# Patient Record
Sex: Male | Born: 1945
Health system: Southern US, Community
[De-identification: ages and names within clinical notes are randomized; demographics above are authoritative.]

## PROBLEM LIST (undated history)

## (undated) DIAGNOSIS — J449 Chronic obstructive pulmonary disease, unspecified: Secondary | ICD-10-CM

## (undated) DIAGNOSIS — Z9289 Personal history of other medical treatment: Secondary | ICD-10-CM

## (undated) DIAGNOSIS — D126 Benign neoplasm of colon, unspecified: Secondary | ICD-10-CM

## (undated) DIAGNOSIS — E785 Hyperlipidemia, unspecified: Secondary | ICD-10-CM

## (undated) DIAGNOSIS — H269 Unspecified cataract: Secondary | ICD-10-CM

## (undated) DIAGNOSIS — Z87442 Personal history of urinary calculi: Secondary | ICD-10-CM

## (undated) DIAGNOSIS — H409 Unspecified glaucoma: Secondary | ICD-10-CM

## (undated) DIAGNOSIS — C159 Malignant neoplasm of esophagus, unspecified: Secondary | ICD-10-CM

## (undated) DIAGNOSIS — I2510 Atherosclerotic heart disease of native coronary artery without angina pectoris: Secondary | ICD-10-CM

## (undated) DIAGNOSIS — D369 Benign neoplasm, unspecified site: Secondary | ICD-10-CM

## (undated) DIAGNOSIS — I6522 Occlusion and stenosis of left carotid artery: Secondary | ICD-10-CM

## (undated) DIAGNOSIS — R7303 Prediabetes: Secondary | ICD-10-CM

## (undated) DIAGNOSIS — J439 Emphysema, unspecified: Secondary | ICD-10-CM

## (undated) DIAGNOSIS — K5790 Diverticulosis of intestine, part unspecified, without perforation or abscess without bleeding: Secondary | ICD-10-CM

## (undated) DIAGNOSIS — Z8719 Personal history of other diseases of the digestive system: Secondary | ICD-10-CM

## (undated) DIAGNOSIS — J189 Pneumonia, unspecified organism: Secondary | ICD-10-CM

## (undated) DIAGNOSIS — I10 Essential (primary) hypertension: Secondary | ICD-10-CM

## (undated) DIAGNOSIS — T8859XA Other complications of anesthesia, initial encounter: Secondary | ICD-10-CM

## (undated) DIAGNOSIS — Z720 Tobacco use: Secondary | ICD-10-CM

## (undated) DIAGNOSIS — I739 Peripheral vascular disease, unspecified: Secondary | ICD-10-CM

## (undated) DIAGNOSIS — K2270 Barrett's esophagus without dysplasia: Secondary | ICD-10-CM

## (undated) DIAGNOSIS — R011 Cardiac murmur, unspecified: Secondary | ICD-10-CM

## (undated) DIAGNOSIS — R000 Tachycardia, unspecified: Secondary | ICD-10-CM

## (undated) DIAGNOSIS — K219 Gastro-esophageal reflux disease without esophagitis: Secondary | ICD-10-CM

## (undated) DIAGNOSIS — T4145XA Adverse effect of unspecified anesthetic, initial encounter: Secondary | ICD-10-CM

## (undated) DIAGNOSIS — R911 Solitary pulmonary nodule: Secondary | ICD-10-CM

## (undated) DIAGNOSIS — E119 Type 2 diabetes mellitus without complications: Secondary | ICD-10-CM

## (undated) DIAGNOSIS — M1990 Unspecified osteoarthritis, unspecified site: Secondary | ICD-10-CM

## (undated) HISTORY — DX: Personal history of other medical treatment: Z92.89

## (undated) HISTORY — DX: Cardiac murmur, unspecified: R01.1

## (undated) HISTORY — DX: Barrett's esophagus without dysplasia: K22.70

## (undated) HISTORY — DX: Peripheral vascular disease, unspecified: I73.9

## (undated) HISTORY — DX: Atherosclerotic heart disease of native coronary artery without angina pectoris: I25.10

## (undated) HISTORY — DX: Hyperlipidemia, unspecified: E78.5

## (undated) HISTORY — PX: CATARACT EXTRACTION: SUR2

## (undated) HISTORY — DX: Unspecified cataract: H26.9

## (undated) HISTORY — DX: Benign neoplasm of colon, unspecified: D12.6

## (undated) HISTORY — DX: Gastro-esophageal reflux disease without esophagitis: K21.9

## (undated) HISTORY — DX: Diverticulosis of intestine, part unspecified, without perforation or abscess without bleeding: K57.90

## (undated) HISTORY — DX: Chronic obstructive pulmonary disease, unspecified: J44.9

## (undated) HISTORY — DX: Tachycardia, unspecified: R00.0

## (undated) HISTORY — PX: JOINT REPLACEMENT: SHX530

## (undated) HISTORY — PX: EYE SURGERY: SHX253

## (undated) HISTORY — DX: Tobacco use: Z72.0

## (undated) HISTORY — DX: Malignant neoplasm of esophagus, unspecified: C15.9

## (undated) HISTORY — DX: Benign neoplasm, unspecified site: D36.9

---
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
## 1991-07-20 HISTORY — PX: FEMORAL-POPLITEAL BYPASS GRAFT: SHX937

## 1996-07-19 HISTORY — PX: CORONARY ARTERY BYPASS GRAFT: SHX141

## 1997-07-19 DIAGNOSIS — R000 Tachycardia, unspecified: Secondary | ICD-10-CM

## 1997-07-19 HISTORY — DX: Tachycardia, unspecified: R00.0

## 1997-11-12 ENCOUNTER — Inpatient Hospital Stay (HOSPITAL_COMMUNITY): Admission: AD | Admit: 1997-11-12 | Discharge: 1997-11-14 | Payer: Self-pay | Admitting: Cardiology

## 1999-02-13 ENCOUNTER — Encounter: Payer: Self-pay | Admitting: Vascular Surgery

## 1999-02-16 ENCOUNTER — Ambulatory Visit: Admission: RE | Admit: 1999-02-16 | Discharge: 1999-02-16 | Payer: Self-pay | Admitting: Vascular Surgery

## 2000-06-21 ENCOUNTER — Encounter: Admission: RE | Admit: 2000-06-21 | Discharge: 2000-06-21 | Payer: Self-pay | Admitting: Urology

## 2000-10-31 ENCOUNTER — Ambulatory Visit (HOSPITAL_COMMUNITY): Admission: RE | Admit: 2000-10-31 | Discharge: 2000-10-31 | Payer: Self-pay | Admitting: Cardiology

## 2002-08-16 ENCOUNTER — Ambulatory Visit (HOSPITAL_COMMUNITY): Admission: RE | Admit: 2002-08-16 | Discharge: 2002-08-16 | Payer: Self-pay | Admitting: Internal Medicine

## 2002-08-16 ENCOUNTER — Encounter: Payer: Self-pay | Admitting: Internal Medicine

## 2003-04-19 ENCOUNTER — Ambulatory Visit (HOSPITAL_COMMUNITY): Admission: RE | Admit: 2003-04-19 | Discharge: 2003-04-19 | Payer: Self-pay | Admitting: Internal Medicine

## 2003-04-19 HISTORY — PX: COLONOSCOPY: SHX174

## 2004-01-25 ENCOUNTER — Ambulatory Visit (HOSPITAL_COMMUNITY): Admission: RE | Admit: 2004-01-25 | Discharge: 2004-01-25 | Payer: Self-pay | Admitting: Internal Medicine

## 2004-02-04 ENCOUNTER — Ambulatory Visit (HOSPITAL_COMMUNITY): Admission: RE | Admit: 2004-02-04 | Discharge: 2004-02-04 | Payer: Self-pay | Admitting: Internal Medicine

## 2004-03-10 ENCOUNTER — Encounter (HOSPITAL_COMMUNITY): Admission: RE | Admit: 2004-03-10 | Discharge: 2004-04-09 | Payer: Self-pay | Admitting: Neurology

## 2004-10-21 ENCOUNTER — Ambulatory Visit: Payer: Self-pay | Admitting: Cardiology

## 2004-10-28 ENCOUNTER — Ambulatory Visit (HOSPITAL_COMMUNITY): Admission: RE | Admit: 2004-10-28 | Discharge: 2004-10-28 | Payer: Self-pay | Admitting: Cardiology

## 2004-10-28 ENCOUNTER — Ambulatory Visit: Payer: Self-pay | Admitting: *Deleted

## 2004-11-20 ENCOUNTER — Ambulatory Visit: Payer: Self-pay | Admitting: Cardiology

## 2004-11-20 ENCOUNTER — Ambulatory Visit (HOSPITAL_COMMUNITY): Admission: RE | Admit: 2004-11-20 | Discharge: 2004-11-20 | Payer: Self-pay | Admitting: Family Medicine

## 2004-12-24 ENCOUNTER — Encounter: Admission: RE | Admit: 2004-12-24 | Discharge: 2004-12-24 | Payer: Self-pay | Admitting: Neurology

## 2005-03-24 ENCOUNTER — Ambulatory Visit (HOSPITAL_COMMUNITY): Admission: RE | Admit: 2005-03-24 | Discharge: 2005-03-24 | Payer: Self-pay | Admitting: Internal Medicine

## 2005-06-06 ENCOUNTER — Emergency Department (HOSPITAL_COMMUNITY): Admission: EM | Admit: 2005-06-06 | Discharge: 2005-06-06 | Payer: Self-pay | Admitting: Emergency Medicine

## 2005-07-06 ENCOUNTER — Ambulatory Visit (HOSPITAL_COMMUNITY): Admission: RE | Admit: 2005-07-06 | Discharge: 2005-07-06 | Payer: Self-pay | Admitting: Neurology

## 2005-07-08 ENCOUNTER — Ambulatory Visit (HOSPITAL_COMMUNITY): Admission: RE | Admit: 2005-07-08 | Discharge: 2005-07-08 | Payer: Self-pay | Admitting: Family Medicine

## 2005-07-15 ENCOUNTER — Ambulatory Visit: Payer: Self-pay | Admitting: Cardiology

## 2005-08-27 ENCOUNTER — Ambulatory Visit: Payer: Self-pay | Admitting: Cardiology

## 2006-08-24 ENCOUNTER — Emergency Department (HOSPITAL_COMMUNITY): Admission: EM | Admit: 2006-08-24 | Discharge: 2006-08-24 | Payer: Self-pay | Admitting: Emergency Medicine

## 2006-08-30 ENCOUNTER — Ambulatory Visit: Payer: Self-pay | Admitting: Cardiology

## 2006-12-06 ENCOUNTER — Ambulatory Visit: Payer: Self-pay | Admitting: Cardiology

## 2006-12-16 ENCOUNTER — Ambulatory Visit: Payer: Self-pay | Admitting: Internal Medicine

## 2006-12-30 ENCOUNTER — Ambulatory Visit (HOSPITAL_COMMUNITY): Admission: RE | Admit: 2006-12-30 | Discharge: 2006-12-30 | Payer: Self-pay | Admitting: Internal Medicine

## 2006-12-30 ENCOUNTER — Ambulatory Visit: Payer: Self-pay | Admitting: Internal Medicine

## 2006-12-30 ENCOUNTER — Encounter: Payer: Self-pay | Admitting: Internal Medicine

## 2007-01-06 ENCOUNTER — Ambulatory Visit: Payer: Self-pay | Admitting: Vascular Surgery

## 2007-01-25 ENCOUNTER — Ambulatory Visit: Payer: Self-pay | Admitting: Cardiology

## 2007-02-09 ENCOUNTER — Ambulatory Visit: Payer: Self-pay | Admitting: Cardiology

## 2007-02-21 ENCOUNTER — Encounter: Payer: Self-pay | Admitting: Internal Medicine

## 2007-02-21 ENCOUNTER — Ambulatory Visit (HOSPITAL_COMMUNITY): Admission: RE | Admit: 2007-02-21 | Discharge: 2007-02-21 | Payer: Self-pay | Admitting: Internal Medicine

## 2007-02-21 ENCOUNTER — Ambulatory Visit: Payer: Self-pay | Admitting: Internal Medicine

## 2007-04-04 ENCOUNTER — Ambulatory Visit: Payer: Self-pay | Admitting: Cardiology

## 2007-06-22 ENCOUNTER — Ambulatory Visit: Payer: Self-pay | Admitting: Cardiology

## 2007-07-06 ENCOUNTER — Ambulatory Visit: Payer: Self-pay | Admitting: Internal Medicine

## 2007-07-26 ENCOUNTER — Ambulatory Visit (HOSPITAL_COMMUNITY): Admission: RE | Admit: 2007-07-26 | Discharge: 2007-07-26 | Payer: Self-pay | Admitting: Family Medicine

## 2007-07-28 ENCOUNTER — Ambulatory Visit: Payer: Self-pay | Admitting: Vascular Surgery

## 2007-10-24 ENCOUNTER — Ambulatory Visit: Payer: Self-pay | Admitting: Gastroenterology

## 2007-10-25 ENCOUNTER — Ambulatory Visit (HOSPITAL_COMMUNITY): Admission: RE | Admit: 2007-10-25 | Discharge: 2007-10-25 | Payer: Self-pay | Admitting: Internal Medicine

## 2007-12-29 ENCOUNTER — Ambulatory Visit: Payer: Self-pay | Admitting: Vascular Surgery

## 2008-07-10 ENCOUNTER — Ambulatory Visit: Payer: Self-pay | Admitting: Vascular Surgery

## 2008-07-19 DIAGNOSIS — D369 Benign neoplasm, unspecified site: Secondary | ICD-10-CM

## 2008-07-19 HISTORY — PX: LIPOMA EXCISION: SHX5283

## 2008-07-19 HISTORY — DX: Benign neoplasm, unspecified site: D36.9

## 2008-07-19 HISTORY — PX: ESOPHAGECTOMY: SUR457

## 2008-07-19 HISTORY — PX: OTHER SURGICAL HISTORY: SHX169

## 2008-07-22 ENCOUNTER — Encounter (INDEPENDENT_AMBULATORY_CARE_PROVIDER_SITE_OTHER): Payer: Self-pay | Admitting: General Surgery

## 2008-07-22 ENCOUNTER — Ambulatory Visit (HOSPITAL_COMMUNITY): Admission: RE | Admit: 2008-07-22 | Discharge: 2008-07-22 | Payer: Self-pay | Admitting: General Surgery

## 2008-12-12 ENCOUNTER — Encounter: Payer: Self-pay | Admitting: Gastroenterology

## 2008-12-19 ENCOUNTER — Encounter: Payer: Self-pay | Admitting: Gastroenterology

## 2008-12-20 ENCOUNTER — Ambulatory Visit: Payer: Self-pay | Admitting: Vascular Surgery

## 2009-01-24 ENCOUNTER — Ambulatory Visit (HOSPITAL_COMMUNITY): Admission: RE | Admit: 2009-01-24 | Discharge: 2009-01-24 | Payer: Self-pay | Admitting: Internal Medicine

## 2009-02-03 DIAGNOSIS — R0989 Other specified symptoms and signs involving the circulatory and respiratory systems: Secondary | ICD-10-CM | POA: Insufficient documentation

## 2009-02-03 DIAGNOSIS — K219 Gastro-esophageal reflux disease without esophagitis: Secondary | ICD-10-CM | POA: Insufficient documentation

## 2009-02-03 DIAGNOSIS — I739 Peripheral vascular disease, unspecified: Secondary | ICD-10-CM | POA: Insufficient documentation

## 2009-02-03 DIAGNOSIS — I2510 Atherosclerotic heart disease of native coronary artery without angina pectoris: Secondary | ICD-10-CM | POA: Insufficient documentation

## 2009-02-03 DIAGNOSIS — K921 Melena: Secondary | ICD-10-CM | POA: Insufficient documentation

## 2009-02-03 DIAGNOSIS — E785 Hyperlipidemia, unspecified: Secondary | ICD-10-CM | POA: Insufficient documentation

## 2009-02-03 DIAGNOSIS — D179 Benign lipomatous neoplasm, unspecified: Secondary | ICD-10-CM | POA: Insufficient documentation

## 2009-03-10 DIAGNOSIS — K625 Hemorrhage of anus and rectum: Secondary | ICD-10-CM | POA: Insufficient documentation

## 2009-03-10 DIAGNOSIS — I10 Essential (primary) hypertension: Secondary | ICD-10-CM | POA: Insufficient documentation

## 2009-03-11 ENCOUNTER — Ambulatory Visit: Payer: Self-pay | Admitting: Internal Medicine

## 2009-03-11 DIAGNOSIS — K5900 Constipation, unspecified: Secondary | ICD-10-CM | POA: Insufficient documentation

## 2009-03-11 DIAGNOSIS — K2270 Barrett's esophagus without dysplasia: Secondary | ICD-10-CM | POA: Insufficient documentation

## 2009-03-12 ENCOUNTER — Encounter: Payer: Self-pay | Admitting: Internal Medicine

## 2009-03-17 ENCOUNTER — Ambulatory Visit: Payer: Self-pay | Admitting: Internal Medicine

## 2009-03-17 ENCOUNTER — Ambulatory Visit (HOSPITAL_COMMUNITY): Admission: RE | Admit: 2009-03-17 | Discharge: 2009-03-17 | Payer: Self-pay | Admitting: Internal Medicine

## 2009-03-17 ENCOUNTER — Encounter: Payer: Self-pay | Admitting: Internal Medicine

## 2009-03-17 HISTORY — PX: COLONOSCOPY: SHX174

## 2009-03-17 HISTORY — PX: ESOPHAGOGASTRODUODENOSCOPY: SHX1529

## 2009-03-19 DIAGNOSIS — C159 Malignant neoplasm of esophagus, unspecified: Secondary | ICD-10-CM

## 2009-03-19 DIAGNOSIS — D126 Benign neoplasm of colon, unspecified: Secondary | ICD-10-CM

## 2009-03-19 HISTORY — DX: Benign neoplasm of colon, unspecified: D12.6

## 2009-03-19 HISTORY — DX: Malignant neoplasm of esophagus, unspecified: C15.9

## 2009-03-20 ENCOUNTER — Encounter: Payer: Self-pay | Admitting: Internal Medicine

## 2009-03-26 ENCOUNTER — Encounter: Payer: Self-pay | Admitting: Internal Medicine

## 2009-04-01 ENCOUNTER — Encounter: Payer: Self-pay | Admitting: Internal Medicine

## 2009-04-03 ENCOUNTER — Encounter: Payer: Self-pay | Admitting: Internal Medicine

## 2009-04-09 ENCOUNTER — Encounter: Payer: Self-pay | Admitting: Internal Medicine

## 2009-04-18 ENCOUNTER — Encounter: Payer: Self-pay | Admitting: Internal Medicine

## 2009-05-28 ENCOUNTER — Encounter: Payer: Self-pay | Admitting: Gastroenterology

## 2009-06-25 ENCOUNTER — Encounter: Payer: Self-pay | Admitting: Internal Medicine

## 2009-07-31 ENCOUNTER — Encounter: Payer: Self-pay | Admitting: Internal Medicine

## 2009-08-27 DIAGNOSIS — Z9289 Personal history of other medical treatment: Secondary | ICD-10-CM

## 2009-08-27 HISTORY — DX: Personal history of other medical treatment: Z92.89

## 2010-08-08 ENCOUNTER — Encounter: Payer: Self-pay | Admitting: Internal Medicine

## 2010-08-20 NOTE — Letter (Signed)
Summary: OFFICE NOTE/[facility]  OFFICE NOTE/[facility]   Imported By: Diana Eves 07/31/2009 10:40:03  _____________________________________________________________________  External Attachment:    Type:   Image     Comment:   External Document

## 2010-10-21 ENCOUNTER — Other Ambulatory Visit (HOSPITAL_COMMUNITY): Payer: Self-pay | Admitting: Family Medicine

## 2010-10-27 ENCOUNTER — Other Ambulatory Visit (HOSPITAL_COMMUNITY): Payer: Self-pay

## 2010-10-28 ENCOUNTER — Encounter (HOSPITAL_COMMUNITY): Payer: Self-pay

## 2010-10-28 ENCOUNTER — Ambulatory Visit (HOSPITAL_COMMUNITY)
Admission: RE | Admit: 2010-10-28 | Discharge: 2010-10-28 | Disposition: A | Discharge status: Home / Self Care | Payer: Medicare Other | Source: Ambulatory Visit | Attending: Family Medicine | Admitting: Family Medicine

## 2010-10-28 DIAGNOSIS — Z9089 Acquired absence of other organs: Secondary | ICD-10-CM | POA: Insufficient documentation

## 2010-10-28 DIAGNOSIS — R109 Unspecified abdominal pain: Secondary | ICD-10-CM | POA: Insufficient documentation

## 2010-10-28 HISTORY — DX: Essential (primary) hypertension: I10

## 2010-10-28 MED ORDER — TECHNETIUM TC 99M MEBROFENIN IV KIT
5.0000 | PACK | Freq: Once | INTRAVENOUS | Status: AC | PRN
  Administered 2010-10-28: 5.5 via INTRAVENOUS

## 2010-11-03 ENCOUNTER — Encounter: Payer: Self-pay | Admitting: Urgent Care

## 2010-11-03 ENCOUNTER — Ambulatory Visit (INDEPENDENT_AMBULATORY_CARE_PROVIDER_SITE_OTHER): Payer: Medicare Other | Admitting: Urgent Care

## 2010-11-03 DIAGNOSIS — K219 Gastro-esophageal reflux disease without esophagitis: Secondary | ICD-10-CM

## 2010-11-03 DIAGNOSIS — D126 Benign neoplasm of colon, unspecified: Secondary | ICD-10-CM

## 2010-11-03 DIAGNOSIS — R1013 Epigastric pain: Secondary | ICD-10-CM

## 2010-11-03 HISTORY — DX: Benign neoplasm of colon, unspecified: D12.6

## 2010-11-03 NOTE — Progress Notes (Signed)
Primary Care Physician:  [name], MD Primary Gastroenterologist:  Dr. Jena Gauss  Chief Complaint  Patient presents with  . Gastrophageal Reflux    Hx of esophageal cancer    HPI:  Gregory Eaton is a 65 y.o. male here for further evaluation of refractory GERD, chest pain.  Hx esophageal ca dx 2010 s/p esophagectomy at [facility] (Dr. Lenis Noon) for T1N1M0 signet ring adenocarcinoma.  Hx chronic GERD.  C/o "churning" & pressure in epigastrum & chest pain.  Was off PPI since 2010.  Started omeprazole 20mg  daily 3 weeks ago.  Pain seems better w/ omeprazole with less episodes & less severe pain, but continues to have episodes.  Pain is worse w/ stress.  Eats 2 meals per day.  Denies dysphagia.  Was seen at [facility] Sep 23 2010 & had CT & was told it was normal.  C/o constipation, having BM daily but not a "good BM".   Denies rectal bleeding or melena.  Has seen cardiologist as well.  Wt stable.    10/21/10 HIDA->Patent biliary tree, Normal gallbladder response to CCK stimulation with normal ejection  fraction of 62%, Suspect gastric pull-up post esophagectomy.    Past Medical History  Diagnosis Date  . Esophageal carcinoma 03/2009    T1N1M0  . Hypertension   . Barrett's esophagus   . GERD (gastroesophageal reflux disease)   . CAD (coronary artery disease)   . PVD (peripheral vascular disease)   . Hyperlipidemia   . Adenomatous colon polyp 03/2009    Last colonoscopy by Dr. Jena Gauss   . Diverticulosis   . Tobacco abuse   . Tachyarrhythmia 1999    Status post ablation at [facility]  . COPD (chronic obstructive pulmonary disease)     Past Surgical History  Procedure Date  . Coronary artery bypass graft 1998  . Femoral-popliteal bypass graft 1993  . Lipoma excision 2010  . Esophagectomy 2010    [facility] Dr. [name]    Current Outpatient Prescriptions  Medication Sig Dispense Refill  . aspirin 81 MG EC tablet Take 81 mg by mouth daily.        . Coenzyme Q10 (CO Q-10) 200 MG CAPS Take 1  tablet by mouth daily.        . CRESTOR 10 MG tablet Take 1 tablet by mouth every other day.      . fish oil-omega-3 fatty acids 1000 MG capsule Take 1 g by mouth daily.        . metoprolol (LOPRESSOR) 50 MG tablet Take 1.5 tablets by mouth Twice daily.      . Multiple Vitamins-Minerals (CENTRUM SILVER PO) Take 1 tablet by mouth daily.        Marland Kitchen NITROSTAT 0.4 MG SL tablet Place 1 tablet under the tongue Every 5 minutes.      Marland Kitchen VITAMIN C, CALCIUM ASCORBATE, PO Take 1,000 mg by mouth daily.        . vitamin E 400 UNIT capsule Take 400 Units by mouth daily.          Allergies as of 11/03/2010 - Review Complete 11/03/2010  Allergen Reaction Noted  . Prednisone  10/28/2010    Family History:There is no known family history of colorectal carcinoma , liver disease, or inflammatory bowel disease.   Problem Relation Age of Onset  . GER disease Mother   . Coronary artery disease Brother   . Congenital heart disease Sister     History   Social History  . Marital Status: Widowed  Spouse Name: N/A    Number of Children: 2  . Years of Education: N/A   Occupational History  . retired     [employer]al architect   Social History Main Topics  . Smoking status: Former Smoker -- 2.0 packs/day for 40 years    Types: Cigarettes    Quit date: 07/20/1991  . Smokeless tobacco: Never Used  . Alcohol Use: 0.0 oz/week     occassional  . Drug Use: No  . Sexually Active: Yes -- Male partner(s)    Birth Control/ Protection: Condom     friend   Review of Systems: Gen: Denies any fever, chills, sweats, anorexia, fatigue, weakness, malaise, weight loss, and sleep disorder CV: Denies angina, palpitations, syncope, orthopnea, PND, peripheral edema, and claudication. Resp: Denies dyspnea at rest, dyspnea with exercise, cough, sputum, wheezing, coughing up blood, and pleurisy. GI: Denies vomiting blood, jaundice, and fecal incontinence.   Denies dysphagia or odynophagia. GU : Denies urinary burning, blood in  urine, urinary frequency, urinary hesitancy, nocturnal urination, and urinary incontinence. MS: Denies joint pain, limitation of movement, and swelling, stiffness, low back pain, extremity pain. Denies muscle weakness, cramps, atrophy.  Derm: Denies rash, itching, dry skin, hives, moles, warts, or unhealing ulcers.  Psych: Denies depression, anxiety, memory loss, suicidal ideation, hallucinations, paranoia, and confusion. Heme: Denies bruising, bleeding, and enlarged lymph nodes.  Physical Exam: BP 143/71  Pulse 63  Temp 98.5 F (36.9 C)  Ht 5\' 8"  (1.727 m)  Wt 165 lb 12.8 oz (75.206 kg)  BMI 25.21 kg/m2 General:   Alert,  Well-developed, well-nourished, pleasant and cooperative in NAD Head:  Normocephalic and atraumatic. Eyes:  Sclera clear, no icterus.   Conjunctiva pink. Ears:  Normal auditory acuity. Nose:  No deformity, discharge,  or lesions. Mouth:  No deformity or lesions, dentition normal. Neck:  Supple; no masses or thyromegaly. Lungs:  Clear throughout to auscultation.   No wheezes, crackles, or rhonchi. No acute distress. Heart:  Regular rate and rhythm; no murmurs, clicks, rubs,  or gallops. Abdomen:  Soft, nontender and nondistended. No masses, hepatosplenomegaly or hernias noted. Normal bowel sounds, without guarding, and without rebound.   Msk:  Symmetrical without gross deformities. Normal posture. Pulses:  Normal pulses noted. Extremities:  Without clubbing or edema. Neurologic:  Alert and  oriented x4;  grossly normal neurologically. Skin:  Intact without significant lesions or rashes. Cervical Nodes:  No significant cervical adenopathy. Psych:  Alert and cooperative. Normal mood and affect.

## 2010-11-03 NOTE — Assessment & Plan Note (Signed)
65 y/o caucasian male s/p esophagectomy for esophageal ca in 2010 who presents w/ refractory GERD, epigastric pain & chest pain despite PPI.  Hx of Barrett's esophagus.  He will need EGD for further evaluation to r/o erosive esophagitis, gastritis, PUD, recurrent disease, anastomotic ulcer.  Recent HIDA normal ruling out gallbladder etiology.  I have discussed risks & benefits which include, but are not limited to, bleeding, infection, perforation & drug reaction.  The patient agrees with this plan & written consent will be obtained.

## 2010-11-03 NOTE — Patient Instructions (Addendum)
Continue omeprazole 20mg  daily Further recommendations to follow EGD

## 2010-11-03 NOTE — Assessment & Plan Note (Signed)
Next colonoscopy 2015 or sooner if any problems.

## 2010-11-25 ENCOUNTER — Other Ambulatory Visit: Payer: Self-pay | Admitting: Internal Medicine

## 2010-11-25 ENCOUNTER — Ambulatory Visit (HOSPITAL_COMMUNITY)
Admission: RE | Admit: 2010-11-25 | Discharge: 2010-11-25 | Disposition: A | Discharge status: Home / Self Care | Payer: Medicare Other | Source: Ambulatory Visit | Attending: Internal Medicine | Admitting: Internal Medicine

## 2010-11-25 ENCOUNTER — Encounter: Payer: Medicare Other | Admitting: Internal Medicine

## 2010-11-25 DIAGNOSIS — E785 Hyperlipidemia, unspecified: Secondary | ICD-10-CM | POA: Insufficient documentation

## 2010-11-25 DIAGNOSIS — K209 Esophagitis, unspecified without bleeding: Secondary | ICD-10-CM

## 2010-11-25 DIAGNOSIS — J449 Chronic obstructive pulmonary disease, unspecified: Secondary | ICD-10-CM | POA: Insufficient documentation

## 2010-11-25 DIAGNOSIS — I10 Essential (primary) hypertension: Secondary | ICD-10-CM | POA: Insufficient documentation

## 2010-11-25 DIAGNOSIS — Z951 Presence of aortocoronary bypass graft: Secondary | ICD-10-CM | POA: Insufficient documentation

## 2010-11-25 DIAGNOSIS — J4489 Other specified chronic obstructive pulmonary disease: Secondary | ICD-10-CM | POA: Insufficient documentation

## 2010-11-25 DIAGNOSIS — R1013 Epigastric pain: Secondary | ICD-10-CM

## 2010-11-25 DIAGNOSIS — Z7982 Long term (current) use of aspirin: Secondary | ICD-10-CM | POA: Insufficient documentation

## 2010-11-25 DIAGNOSIS — Z8501 Personal history of malignant neoplasm of esophagus: Secondary | ICD-10-CM | POA: Insufficient documentation

## 2010-11-25 DIAGNOSIS — Z79899 Other long term (current) drug therapy: Secondary | ICD-10-CM | POA: Insufficient documentation

## 2010-11-25 DIAGNOSIS — Z9089 Acquired absence of other organs: Secondary | ICD-10-CM | POA: Insufficient documentation

## 2010-11-25 HISTORY — PX: ESOPHAGOGASTRODUODENOSCOPY: SHX1529

## 2010-11-27 NOTE — Op Note (Signed)
NAME:  FINLEE, MILO                ACCOUNT NO.:  [id number]  MEDICAL RECORD NO.:  [id number]           PATIENT TYPE:  O  LOCATION:  DAYP                          FACILITY:  [facility]  PHYSICIAN:  R. Roetta Sessions, M.D. DATE OF BIRTH:  [birth year]  DATE OF PROCEDURE:  11/25/2010 DATE OF DISCHARGE:                              OPERATIVE REPORT   INDICATIONS FOR PROCEDURE:  The patient is a 65 year old gentleman with longstanding GERD, Barrett's esophagus, found to have adenocarcinoma on EGD by me back in August 2010.  Ultimately, he underwent an esophagectomy and gastric pull-up by Dr. Lenis Noon over at [facility].  He had limited stage disease.  Fortunately, saw the oncologist, did not require any adjuvant therapy.  Clinically, he has done well, although he has had some vague epigastric pain (subxiphoid pain) off and on over the past couple of months.  He has taken some omeprazole 20 mg orally daily with some improvement in his symptoms recently.  No odynophagia.  No dysphagia.  No hematemesis.  No melena or hematochezia.  He is now eating whatever he wants to eat he states and feels that he is doing very well.  EGD is now being done to further evaluate the symptoms. Risks, benefits, limitations, alternatives, imponderables have been discussed, questions answered.  PROCEDURE NOTE:  O2 saturation, blood pressure, pulse, respirations monitored throughout the entire procedure.  CONSCIOUS SEDATION:  Versed and Demerol in incremental doses.  INSTRUMENT:  Pentax video chip system.  Cetacaine spray for topical pharyngeal anesthesia.  FINDINGS:  Examination of the tubular esophagus revealed evidence of prior esophagectomy just below the upper esophageal sphincter, pearly gray mucosa, what appeared to be a gastric anastomosis with sutures protruding into the lumen.  The anastomosis was friable.  It appeared there was some salmon-colored epithelium coming up a good centimeter to a centimeter and a  half above the suture line and there were also esophageal erosions straddling the anastomosis on the esophageal side. In addition, there was a 1-cm "patch" of salmon-colored epithelium proximal to the area of anastomosis (?inlet patch) and a very smaller secondary outlet adjacent to it.  Please see multiple photographs.  The scope was easily advanced into the remainder of the stomach.  The residual gastric mucosa appeared entirely normal.  Pylorus was patent, easily traversed.  Examination of the bulb and second portion revealed no abnormalities.  THERAPEUTIC/DIAGNOSTIC MANEUVERS PERFORMED:  The large island of salmon- colored epithelium was biopsied for histologic study and also the salmon- colored epithelium coming up confluently proximal to the suture line was also biopsied separately.  The patient tolerated the procedure well, was reactive to endoscopy.  IMPRESSION: 1. Status post esophagectomy with gastric pull-up as described above.     Esophageal erosions straddling the surgical anastomosis. 2. Salmon-colored epithelium coming up a good centimeter to a     centimeter and a half above the suture line (?recurrent Barrett     esophagus in the proximal esophageal remnant) status post biopsy. 3. Islands of salmon-colored epithelium in the most proximal residual     esophagus (one large outlet and one small outlet), large island was  biopsied. 4. Remainder of gastric mucosa appeared normal, patent pylorus, normal     D1 and D2.  RECOMMENDATIONS: 1. Stop omeprazole.  Give pantoprazole 40 mg orally twice daily. 2. Antireflux dietary measures, etc., were provided.  Written     instructions provided. 3. Follow up on path. 4. Further recommendations to follow.     Jonathon Bellows, M.D.     RMR/MEDQ  D:  11/25/2010  T:  11/25/2010  Job:  [id number]  cc:   [facility].  Herbie Saxon, MD Fax: [phone]  Electronically Signed by Lorrin Goodell M.D. on 11/27/2010  08:20:19 AM

## 2010-12-01 NOTE — Op Note (Signed)
NAME:  Gregory Eaton, [name]                ACCOUNT NO.:  [id number]   MEDICAL RECORD NO.:  [id number]          PATIENT TYPE:  AMB   LOCATION:  DAY                           FACILITY:  [facility]   PHYSICIAN:  Dalia Heading, M.D.  DATE OF BIRTH:  04-09-1946   DATE OF PROCEDURE:  07/22/2008  DATE OF DISCHARGE:                               OPERATIVE REPORT   PREOPERATIVE DIAGNOSIS:  Subcutaneous neoplasm, back.   POSTOPERATIVE DIAGNOSIS:  Subcutaneous neoplasm, back.   PROCEDURE:  Excision of subcutaneous neoplasm, back.   SURGEON:  Dalia Heading, MD   ANESTHESIA:  General.   INDICATIONS:  The patient is a 65 year old white male who has an  enlarging subcutaneous mass in his back.  It has been present for many  years.  It was starting to cause discomfort.  The patient comes to the  operating room for excision of the neoplasm of the back.  The risks and  benefits of the procedure including bleeding, infection, recurrence of  the neoplasm were fully explained to the patient, gave informed consent.   PROCEDURE NOTE:  The patient was placed in the left lateral decubitus  position after induction of general endotracheal anesthesia.  The back  was prepped and draped using the usual sterile technique with DuraPrep.  Surgical site confirmation was performed.   The mass was a multilobulated subcutaneous mass, which was greater than  15 cm in size.  A transverse incision was made over the mass, which was  on the left upper back.  The dissection was taken down to the  subcutaneous tissue.  The multiple lobules that were present were  excised down to the muscle without difficulty.  Any bleeding was  controlled using Bovie electrocautery.  The specimen sent to Pathology  for further examination.  It appeared to be a lipoma.  The wound was  irrigated with normal saline.  The subcutaneous layer was reapproximated  using 2-0 Vicryl interrupted sutures.  The skin was closed using  staples.  Sensorcaine  0.5%  was instilled into the surrounding wound.  Betadine ointment and dry sterile dressing were applied.   All tape and needle counts were correct at the end of the procedure.  The patient was extubated in the operating room and went back to  recovery room awake in stable condition.   COMPLICATIONS:  None.   SPECIMENS:  Subcutaneous mass, back.   BLOOD LOSS:  Minimal.      Dalia Heading, M.D.  Electronically Signed     MAJ/MEDQ  D:  07/22/2008  T:  07/22/2008  Job:  [id number]   cc:   Madelin Rear. Sherwood Gambler, MD  Fax: [phone]

## 2010-12-01 NOTE — Op Note (Signed)
NAME:  Gregory Eaton, Gregory Eaton                ACCOUNT NO.:  [id number]   MEDICAL RECORD NO.:  [id number]          PATIENT TYPE:  AMB   LOCATION:  DAY                           FACILITY:  [facility]   PHYSICIAN:  R. Roetta Sessions, M.D. DATE OF BIRTH:  07-Jun-1946   DATE OF PROCEDURE:  02/21/2007  DATE OF DISCHARGE:                               OPERATIVE REPORT   PROCEDURE PERFORMED:  EGD with biopsy.   INDICATIONS FOR PROCEDURE:  The patient is a pleasant 65 year old  Caucasian male with known Barrett's esophagus.  He underwent EGD with  biopsies in June of this year.  He was found have some glandular atypia.  We bumped his AcipHex up to b.i.d.  He has done well.  He is here for  repeat biopsies.  This approach has been discussed with the patient at  length.  Potential risks, benefits and alternatives have been reviewed,  questions answered.  Please see documentation on the medical record.  He  had been off his Coumadin for nearly 7 days.   O2 saturation, blood pressure, pulse were monitored during the entire  procedure.   CONSCIOUS SEDATION:  Versed 4 mg IV, Demerol 75 mg IV in divided doses.   INSTRUMENT:  Pentax video chip system.  Cetacaine spray topical  pharyngeal anesthesia.   Examination of the tubular esophagus revealed salmon-colored epithelium,  confluently to approximately 34 cm from the incisors.  EG junction was  down about 39.  Careful examination of the salmon-colored  epithelium  demonstrated some subtle was scalloping of the proximal margin of  Barrett's.  Please see photos.  There is no out and out specific lesions  seen.  However, EG junction was easily traversed.  The stomach gas  cavity was emptied, insufflated well with air.  The gastric mucosa on  retroflexion of he proximal stomach, esophagogastric junction  demonstrated only a hiatal hernia.  Pylorus patent, easily traversed.  Examination of the bulb, second portion revealed no abnormalities.   THERAPEUTIC/DIAGNOSTIC  MANEUVERS:  The biopsies of the proximal leading  edge of the salmon-colored epithelium was biopsied for histologic study.  [place], circumferential biopsies were taken and distally just above the  EG junction, biopsies were taken.  The patient tolerated the procedure  well, was __________  endoscopy.   IMPRESSION:  Approximately 5 cm segment of salmon colored epithelium  with some subtle scalloping of the proximal edge status post segmental  biopsies.  Hiatal hernia.  Otherwise normal stomach.   RECOMMENDATIONS:  1. Continue AcipHex 20 grams orally b.i.d.  2. Wait until tomorrow to resume Coumadin.  3. Follow-up on path.  4. Further recommendations to follow.      Jonathon Bellows, M.D.  Electronically Signed     RMR/MEDQ  D:  02/21/2007  T:  02/21/2007  Job:  [id number]

## 2010-12-01 NOTE — Assessment & Plan Note (Signed)
NAMEMarland Kitchen  Gregory, Eaton                 CHART#:  [id number]   DATE:  07/06/2007                       DOB:  16-Jul-1946   Followup Barrett's esophagus, gastroesophageal reflux disease.  Last  seen back in August at which time he underwent an EGD which revealed a 5-  cm segment of salmon-colored epithelials with subtle scalloping in the  proximal edge which was biopsied, and he also had a hiatal hernia.  He  previously had some mild atypia for which he was bumped up to Aciphex 20  mg orally b.i.d.  The repeat biopsies demonstrated intestinal  metaplasia, no evidence of atypia or dysplasia.  Certainly no evidence  of neoplasm.  He is not having any typical reflux symptoms.  He is  having no dysphagia.  He is up 4 pounds from where he weighed in in May  of this year.  He does have some vague chest pressure which he describes  the pressure behind his breastbone into the left over his anterior rib  cage, slightly more noticeable when he lies down.  He is very active  during the day, mows yards, gets on the treadmill, etc.  Does not really  have any exertional component.  He does not have any dyspnea.   He has seen Dr. Dietrich Eaton who does not feel like it is his heart.  He has  not tried any nitroglycerin which Dr. Dietrich Eaton gave him empirically.  Does not have a positional component.  Is not effected by eating or  fasting.  He has had no melena or rectal bleeding.  No early satiety.  Gallbladder ultrasound earlier this year was negative.   CURRENT MEDICATIONS:  See updated list.   ALLERGIES:  ASA listed, but he does tolerate an aspirin 81 mg daily.  Has had no trouble with nonsteroidals in the past.  He was on warfarin  related to his revascularization, left lower extremity, but now has come  off Coumadin.   PHYSICAL EXAMINATION:  He looks just fine.  Weight 181, height 5 feet 8 inches, temp 97.9, BP 124/80, pulse 80.  SKIN:  Warm and dry.  CHEST:  Lungs are clear to auscultation.  CARDIAC  EXAM:  Regular rate and rhythm without murmurs, gallops, or  rubs.  He does have some vague tenderness over his sternal body and left  lower rib cage, midclavicular line.  ABDOMEN:  Somewhat obese, positive bowel sounds, soft, entirely  nontender to deep palpation.   ASSESSMENT:  The patient has Barrett's esophagus and long-standing  gastroesophageal reflux disease.  I feel his reflux symptoms are well  controlled on b.i.d. Aciphex.  He has vague chest discomfort which I  feel is more musculoskeletal than gastrointestinal in etiology.  It  certainly does not sound like it is coming from his heart.   I suppose he could have an ongoing esophageal motility disorder, but his  symptoms are most atypical, and he does have chest wall tenderness to  palpation.   RECOMMENDATIONS:  A 2-week course of voltaren 50 mg orally twice daily.  Prescription for #28 given.  Warned about potential side effects.  Continue Aciphex 20 mg orally b.i.d. during this period of time.  He  will let us know in 2 weeks how he is doing, and we will go from there  based on his response  to nonsteroidal agents.  Further recommendations  to follow.       Gregory Eaton, M.D.  Electronically Signed     RMR/MEDQ  D:  07/06/2007  T:  07/06/2007  Job:  [id number]   cc:   Gregory Friends. Dietrich Pates, MD, [facility]  Gregory Eaton, M.D.

## 2010-12-01 NOTE — H&P (Signed)
NAME:  Gregory Eaton, Gregory Eaton NO.:  [id number]   MEDICAL RECORD NO.:  [id number]          PATIENT TYPE:  PAMB   LOCATION:  DAY                           FACILITY:  [facility]   PHYSICIAN:  Dalia Heading, M.D.  DATE OF BIRTH:  1946-05-03   DATE OF ADMISSION:  DATE OF DISCHARGE:  LH                              HISTORY & PHYSICAL   CHIEF COMPLAINT:  Neoplasm, back, unspecified.   HISTORY OF PRESENT ILLNESS:  The patient is a 65 year old white male who  was referred for evaluation and treatment of an enlarging mass on his  back.  This has been present for many years, but recently has increased  in size and is causing discomfort.  No drainage has been noted.   PAST MEDICAL HISTORY:  1. Coronary artery disease.  2. Hypertension.  3. High cholesterol levels.   PAST SURGICAL HISTORY:  1. CABG in 1998.  2. Leg surgery in 1993.   CURRENT MEDICATIONS:  1. Benicar, Toprol, Capadex, and simvastatin.   ALLERGIES:  NO KNOWN DRUG ALLERGIES.   REVIEW OF SYSTEMS:  Noncontributory.  The patient denies any recent chest pain, MI, CVA, or diabetes mellitus.   PHYSICAL EXAMINATION:  The patient is a well-developed, well-nourished  white male in no acute distress.  LUNGS:  Clear to auscultation with equal breath sounds bilaterally.  HEART:  Regular rate and rhythm without S3, S4, murmurs.  BACK:  A large greater-than-12-cm lobulated subcutaneous mass in the  left upper back.  It is rubbery with no overlying skin lesions.   IMPRESSION:  Neoplasm, back, unspecified.   PLAN:  The patient is scheduled for excision of the neoplasm, back, on  07/22/2008.  The risks and benefits of the procedure including bleeding,  infection, and the possibility of malignancy were fully explained to the  patient, gave informed consent.      Dalia Heading, M.D.  Electronically Signed     MAJ/MEDQ  D:  07/04/2008  T:  07/05/2008  Job:  [id number]   cc:   Madelin Rear. Sherwood Gambler, MD  Fax: [phone]

## 2010-12-01 NOTE — Letter (Signed)
April 04, 2007    [name], M.D.  [address]  [place], [place] [address]   RE:  Gregory Eaton, Gregory Eaton  MRN:  [id number]  /  DOB:  05/05/1946   Dear [name]:   Gregory Eaton was seen the office today at his request. He has been having  episodes of chest discomfort. He describes a sense of heaviness over the  anterior chest associated with a feeling as if he needs to sigh. There  is no true dyspnea. There is no diaphoresis nor nausea. The occurrence  is unpredictable. Symptoms may last as long as 1 week continuously with  some waxing and waning. He has not tried to do anything for this. He  does feel somewhat better if he rises and walks around. His symptoms  tend to be worse at night. There is no clear relationship to food  consumption.   CURRENT MEDICATIONS:  Include:  1. Warfarin as directed.  2. Benicar 20 mg daily.  3. Aspirin 81 mg daily.  4. Toprol 50 mg daily.  5. Atorvastatin 40 mg daily.  6. Aciphex 20 mg b.i.d.   On exam, pleasant somewhat overweight gentleman in no acute distress.  The weight is 179, two pounds less than in July. Blood pressure 130/75,  heart rate 80 and regular, respirations 16.  NECK: No jugular venous distension; faint left carotid bruits; low  pitched right carotid bruits.  LUNGS: Clear.  CARDIAC: Normal first and second heart sounds; fourth heart sound  present.  ABDOMEN: Soft and nontender; no masses. Normal bowel sounds without  bruits; no organomegaly.  EXTREMITIES: No edema; 1+ distal pulses.   IMPRESSION:  Gregory Eaton returns  with atypical chest discomfort. As the  prolonged nature of his symptoms makes it unlikely that this represents  myocardial ischemia, it certainly could be related to his  gastrointestinal problems. We will proceed with empiric therapy with  both antacids and sublingual nitroglycerine. I will recheck an EKG. If  his symptoms continue, he may need repeat stress testing and  reevaluation by Dr. Jena Gauss. I will reassess  this nice gentleman in 1  month.    Sincerely,      [name], MD, [facility]  Electronically Signed    RMR/MedQ  DD: 04/04/2007  DT: 04/04/2007  Job #: [id number]   CC:    R. Roetta Sessions, M.D.

## 2010-12-01 NOTE — Consult Note (Signed)
NAME:  Gregory Eaton, Gregory Eaton                ACCOUNT NO.:  [id number]   MEDICAL RECORD NO.:  [id number]           PATIENT TYPE:  AMB   LOCATION:                                FACILITY:  [facility]   PHYSICIAN:  R. Roetta Sessions, M.D. DATE OF BIRTH:  03-04-1946   DATE OF CONSULTATION:  12/16/2006  DATE OF DISCHARGE:                                 CONSULTATION   REASON FOR CONSULTATION:  1. Nausea.  2. Upper abdominal, lower chest discomfort.  3. History of gastroesophageal reflux disease.   HISTORY OF PRESENT ILLNESS:  [name] is a very pleasant 65-year-  old Caucasian male sent over at the courtesy of [facility] to further evaluate a good 4-5 month history of nonspecific  upper abdominal and lower chest discomfort symptoms.  He has a long  history of gastroesophageal reflux disease, [place] the beach [place]  [place] and having what he describes a bad reflux episode with  heartburn for 3 days.  He has been off and on Prilosec and over-the-  counter agents along the way.  He was started on Aciphex a number of  months ago, 20 mg orally daily.  This predated his recent symptoms.  He  did not have any odynophagia or dysphagia.  He went to the emergency  room.  He has seen Dr. Dietrich Pates on more than one occasion.  This was not  felt to be secondary to a cardiopulmonary process.   He tells me if he did take Aciphex his reflux symptoms would be  terrible.  He has had some vague symptoms of nausea but no vomiting; if  he eats late at night, he pays dearly with reflux symptoms,  regurgitation all night long.  He denies any frank abdominal pain.  Although he has vague upper abdominal discomfort, it is not always  postprandial in timing.  He has 1 formed bowel movement daily.  He  denies melena or hematochezia.  He has actually lost 7 pounds since I  last saw him 65.  In 1998, he underwent an EGD which demonstrated  erosive reflux esophagitis, patulous esophagogastric  junction, small  hiatal hernia, he also had some duodenitis.  He was CLO biopsy negative  at that time.  Dr. Karilyn Cota saw him and performed a colonoscopy for Eaton  volume hematochezia.  Back in October of 2004, he was found to have some  external hemorrhoids and left-sided diverticula.  He had some __________  polyps removed (inflammatory).  There is no family history of GI  neoplasia.  He does not use nonsteroidals.  He was a former long term  smoker, but none in 10 years.  He does not use alcohol.  He does take a  baby aspirin daily.  His gallbladder remains in-situ.   PAST MEDICAL HISTORY:  1. Hypertension.  2. Coronary artery disease status post CABG.  3. Status post leg surgery, revascularization left lower extremity.      He has had to remain on chronic Coumadin therapy because of this      problem.  MEDICATIONS:  1. Warfarin 5 mg Monday, Wednesday, Thursday, Saturday, 7.5 mg Sunday,      Tuesday, Thursday.  2. Lipitor 40 mg daily.  3. Aciphex 20 mg daily.  4. Metoprolol 50 mg daily.  5. Benicar 20 mg daily.  6. Vitamin E 400 units daily.  7. Fish Oil 1 g daily.  8. Centrum Silver daily.  9. ASA 1 mg daily aspirin.   ALLERGIES:  ASPIRIN.   FAMILY HISTORY:  1. Father died at age 41 in a car accident.  2. Mother is alive at age 54, history of heart disease and      gastroesophageal reflux disease.  3. No history of chronic GI or liver illness.   SOCIAL HISTORY:  1. The patient is widowed.  2. He is retired from [employer].  3. He used to be a long distance Naval architect.  4. No tobacco or alcohol.  5. No illicit drugs.   REVIEW OF SYSTEMS:  No recent chest pain or dyspnea on exertion.  Weight  loss as outlined above.  No fever or chills.  No yellow jaundice, clay-  colored stools, dark colored urine.   PHYSICAL EXAMINATION:  A pleasant 65 year old gentleman resting  comfortably.  Weight 177, height 5 feet 8 inches, temp 97.3, BP 142/70,  pulse 72.  Skin  warm and dry.  He has a couple of malar spiders.  Otherwise, there is no jaundice.  HEENT:  There is no scleral icterus and conjunctiva are pink.  Oral  cavity no lesions.  CHEST:  Lungs are clear to auscultation.  CARDIAC EXAM:  Regular rate and rhythm without murmur, gallop or rub.  ABDOMEN:  Nondistended, positive bowel sounds.  Has minimal right upper  quadrant tenderness to palpation.  On deep palpation no appreciable mass  or organomegaly.  EXTREMITIES:  No edema.   IMPRESSION:  [name] is a pleasant 65 year old gentleman with a  long history of complicated gastroesophageal reflux disease who has been  on a proton pump inhibitor in the way of Aciphex, over the past 4 months  has developed new worsening symptoms of vague upper abdominal and lower  chest discomfort, pressure sensation which he perceives as worsening  reflux.  I am glad to se that he has had a negative cardiopulmonary  workup thus far and has been followed by Dr. Dietrich Pates.   His symptoms seem a little bit atypical for me just to be refractory  gastroesophageal reflux disease symptoms.  There is an outside chance he  could have occult gallbladder disease to account for some of these  symptoms.  He really does not have any frank out-and-out abdominal pain  which would typically be seen with ichemia but he does have a history of  peripheral vascular disease.   RECOMMENDATIONS:  I have asked him to go ahead for the time being and  increase in his Aciphex to 20 mg orally b.i.d. (i.e. before breakfast  and supper).   PLAN:  To go ahead and perform an upper abdominal ultrasound followed by  diagnostic EGD the same day.  Risks, benefits and alternatives have been  reviewed, questions answered, he is agreeable.  I have asked him to stop  his Coumadin 3 days prior to EGD.  Will make further recommendations in  the very near future.  I would like to thank Dr. Patrica Duel for allowing me to see this nice   gentleman once again.      Jonathon Bellows, M.D.  Electronically Signed     RMR/MEDQ  D:  12/16/2006  T:  12/16/2006  Job:  [id number]

## 2010-12-01 NOTE — Procedures (Signed)
BYPASS GRAFT EVALUATION   INDICATION:  Follow-up evaluation of lower extremity bypass graft.   HISTORY:  Diabetes:  No.  Cardiac:  Coronary artery disease.  Hypertension:  Yes.  Smoking:  Quit in 1997.  Previous Surgery:  Right iliac PTA and stent in 1999, left fem-pop  bypass graft with Gore-Tex in saphenous vein in 1993.   SINGLE LEVEL ARTERIAL EXAM                               RIGHT              LEFT  Brachial:                    142                141  Anterior tibial:             86                 121  Posterior tibial:            113                152  Peroneal:  Ankle/brachial index:        0.80               1.07   PREVIOUS ABI:  Date: 07/10/2008  RIGHT:  0.81  LEFT:  0.89   LOWER EXTREMITY BYPASS GRAFT DUPLEX EXAM:   DUPLEX:  1. Increased velocities noted throughout the right iliac stent.  2. Biphasic duplex waveform noted within left fem-pop bypass graft and      native artery.  3. Please see attached worksheet for right iliac stent velocities.   IMPRESSION:  1. Patent left fem-pop bypass graft with no evidence of focal      stenosis.  2. Right lower extremity ABI suggests mild to moderate arterial      disease with biphasic Doppler waveforms.  3. Normal left ABI with biphasic and triphasic Doppler waveform.   ___________________________________________  Larina Earthly, M.D.   AC/MEDQ  D:  12/20/2008  T:  12/20/2008  Job:  [id number]

## 2010-12-01 NOTE — Letter (Signed)
June 22, 2007    [name], M.D.  [address]  [place], [place] [address]   RE:  DEE, MADAY  MRN:  [id number]  /  DOB:  [birth year]   Dear [name]:   Mr. Mesta returns to the office for continuing assessment of chest  discomfort.  For the most part, his symptoms have resolved. He does use  antacids on occasion with relief.  He does experience some regurgitation  of food and burning chest discomfort on occasion, particularly when he  assumes the supine position after a large meal.  He occasionally notes  some discomfort in the mid substernal region with a deep breath, but  this does not seem to be persistent.   He brings in a list of approximately 100 blood pressure determinations.  Perhaps 20-25% of them show systolics above 150.   CURRENT MEDICATIONS:  1. Benicar 20 mg daily.  2. Aspirin 81 mg daily.  3. Toprol 50 mg daily.  4. Atorvastatin 40 mg daily.  5. Aciphex 20 mg b.i.d.   PHYSICAL EXAMINATION:  VITAL SIGNS:  Weight 182, 3 pounds more than in  September.  Blood pressure 150/75, heart rate 72 and regular,  respirations 18.  GENERAL APPEARANCE:  A pleasant gentleman in no acute distress.  NECK:  No jugular venous distension, normal carotid upstrokes without  bruits.  LUNGS:  Clear.  CARDIOVASCULAR:  Normal first and second heart sounds, fourth heart  sound present.  ABDOMEN:  Soft and nontender, no organomegaly.  EXTREMITIES:  No edema.   IMPRESSION:  Mr. Paino is improved with treatment for gastroesophageal  reflux disease, which appears to be his primary problem.  He intends to  consult with Dr. Jena Gauss in the near future.  Otherwise, his cardiac  status is stable and his coronary disease well managed.  Blood pressure  control is somewhat suboptimal.  Chlorthalidone 12.5 mg daily will be  added to his regime with a chemistry profile to be obtained in one  month.  I will plan to see this nice gentleman again in seven months.    Sincerely,      [name], MD, [facility]  Electronically Signed    RMR/MedQ  DD: 06/22/2007  DT: 06/22/2007  Job #: [id number]   CC:    R. Roetta Sessions, M.D.

## 2010-12-01 NOTE — Procedures (Signed)
CAROTID DUPLEX EXAM   INDICATION:  Follow-up evaluation of known cerebrovascular disease.   HISTORY:  Diabetes:  No.  Cardiac:  Coronary artery disease.  Hypertension:  Yes.  Smoking:  Quit in 1997.  Previous Surgery:  No.  CV History:  Patient reports no cerebrovascular symptoms at this time,  however, previous study performed on July 04, 2006 revealed no right  ICA stenosis and a 20-39% left ICA stenosis.  Amaurosis Fugax No, Paresthesias No, Hemiparesis No                                       RIGHT             LEFT  Brachial systolic pressure:         113               107  Brachial Doppler waveforms:         Triphasic         Triphasic  Vertebral direction of flow:        Antegrade         Occluded  DUPLEX VELOCITIES (cm/sec)  CCA peak systolic                   111               67  ECA peak systolic                   141               281  ICA peak systolic                   68                112  ICA end diastolic                   26                41  PLAQUE MORPHOLOGY:                  Mixed irregular   Calcified  PLAQUE AMOUNT:                      Mild-to-moderate  Moderate  PLAQUE LOCATION:                    Proximal ICA/ECA  Proximal ICA   IMPRESSION:  1. Occluded left vertebral artery.  2. 20-39% right internal carotid artery stenosis.  3. 40-59% left internal carotid artery stenosis.  4. Bilateral external carotid artery stenosis, left worse than right.   ___________________________________________  Larina Earthly, M.D.   MC/MEDQ  D:  07/28/2007  T:  07/28/2007  Job:  [id number]

## 2010-12-01 NOTE — Letter (Signed)
Dec 06, 2006    [name], M.D.  [address]  [place], [place] [address]   RE:  Gregory Eaton, Gregory Eaton  MRN:  [id number]  /  DOB:  [birth year]   Dear [name]:   Gregory Eaton returned to the office today at the request of your physician's  assistant.  He has come to your office twice over the past 3 months  complaining of vague abdominal and epigastric symptoms.  He describes  this as a rolling in his stomach.  It sounds like it could represent  crampy abdominal pain.  There is no associated nausea, diaphoresis nor  dyspnea.  He has intermittently developed soreness along both sternal  margins with a pleuritic component.  He remains active, including doing  considerable yard work, without any symptoms.  If he walks up a hill or  up stairs, he experiences some minor claudication.   MEDICATIONS:  Are unchanged and include:  1. Warfarin as directed.  2. Benicar 20 mg daily.  3. Aspirin 81 mg daily.  4. Toprol 50 mg daily.  5. Atorvastatin 40 mg daily.  6. The recent addition of Aciphex one daily.   On exam, a pleasant well-appearing gentleman in no acute distress.  The  weight is 177, stable.  Blood pressure 130/60, heart rate 84 and  regular, respirations 16.  NECK:  No jugular venous distention; normal carotid upstrokes without  bruits.  LUNGS:  Clear.  CARDIAC:  Normal first and second heart sounds.  ABDOMEN:  Soft and nontender; normal bowel sounds; no masses; no  organomegaly.  EXTREMITIES:  Decreased distal pulses on the right; no edema.   EKG:  Normal sinus rhythm; prominent voltage; nondiagnostic inferior Q  waves.  When compared with a prior tracing of August 27, 2005, axis has  shifted rightward.   IMPRESSION:  Gregory Eaton is experiencing predominantly abdominal discomfort  with atypical chest discomfort.  The absence of a relationship to  exertion, the pleuritic nature of his discomfort and the fairly mild and  intermittent nature of his symptoms would lead me to believe that  this  is probably not of cardiac etiology.  There are plans for GI evaluation.  I would suggest that we proceed with that prior to any additional  cardiac testing or treatment.  Gregory Eaton will call Dr. Cira Servant, as you  suggested.  I will plan to see him again in 6 weeks.  He is cautioned to  call or seek assistance should he develop any prolonged chest  discomfort, dyspnea, lightheadedness or syncope.    Sincerely,      [name], MD, [facility]  Electronically Signed    RMR/MedQ  DD: 12/06/2006  DT: 12/06/2006  Job #: [id number]

## 2010-12-01 NOTE — Procedures (Signed)
BYPASS GRAFT EVALUATION   INDICATION:  Followup evaluation of lower extremity bypass graft and  stent.   HISTORY:  Diabetes:  No.  Cardiac:  Coronary artery disease.  Hypertension:  Yes.  Smoking:  Quit 1997.  Previous Surgery:  Left fem-pop bypass graft with Gore-Tex in saphenous  vein in June of 1993.  Right iliac PTA and stent on Dec 10, 1997.   SINGLE LEVEL ARTERIAL EXAM                               RIGHT              LEFT  Brachial:                    113                107  Anterior tibial:             95                 118  Posterior tibial:            129                107  Peroneal:  Ankle/brachial index:        >1.0               >1.0   PREVIOUS ABI:  Date: January 06, 2007  RIGHT:  0.65  LEFT:  0.85   LOWER EXTREMITY BYPASS GRAFT DUPLEX EXAM:   DUPLEX:  On the right the peak systolic velocity in the right external  iliac artery is 280 cm per second.  Previous study on January 06, 2007, was  334 cm per second.  On the left waveforms are monophasic proximal to,  within and distal to the bypass graft.   IMPRESSION:  1. ABIs are higher than previously recorded bilaterally.  2. Patent left fem-pop bypass graft.  3. No change in right iliac artery peak systolic velocities from      previous study.   ___________________________________________  Larina Earthly, M.D.   MC/MEDQ  D:  07/28/2007  T:  07/28/2007  Job:  [id number]

## 2010-12-01 NOTE — H&P (Signed)
NAME:  Gregory Eaton, Gregory Eaton                ACCOUNT NO.:  [id number]   MEDICAL RECORD NO.:  [id number]           PATIENT TYPE:  AMB   LOCATION:  DAY                           FACILITY:  [facility]   PHYSICIAN:  Dalia Heading, M.D.  DATE OF BIRTH:  1946/01/02   DATE OF ADMISSION:  DATE OF DISCHARGE:  LH                              HISTORY & PHYSICAL   CHIEF COMPLAINT:  Neoplasm, back, unspecified.   HISTORY OF PRESENT ILLNESS:  The patient is a 65 year old white male who  was referred for evaluation and treatment of an enlarging mass on his  back.  This has been present for many years, but recently has increased  in size and is causing discomfort.  No drainage has been noted.   PAST MEDICAL HISTORY:  1. Coronary artery disease.  2. Hypertension.  3. High cholesterol levels.   PAST SURGICAL HISTORY:  1. CABG in 1998.  2. Leg surgery in 1993.   CURRENT MEDICATIONS:  1. Benicar, Toprol, Capadex, and simvastatin.   ALLERGIES:  NO KNOWN DRUG ALLERGIES.   REVIEW OF SYSTEMS:  Noncontributory.  The patient denies any recent chest pain, MI, CVA, or diabetes mellitus.   PHYSICAL EXAMINATION:  The patient is a well-developed, well-nourished  white male in no acute distress.  LUNGS:  Clear to auscultation with equal breath sounds bilaterally.  HEART:  Regular rate and rhythm without S3, S4, murmurs.  BACK:  A large greater-than-12-cm lobulated subcutaneous mass in the  left upper back.  It is rubbery with no overlying skin lesions.   IMPRESSION:  Neoplasm, back, unspecified.   PLAN:  The patient is scheduled for excision of the neoplasm, back, on  07/22/2008.  The risks and benefits of the procedure including bleeding,  infection, and the possibility of malignancy were fully explained to the  patient, gave informed consent.      Dalia Heading, M.D.  Electronically Signed     MAJ/MEDQ  D:  07/04/2008  T:  07/05/2008  Job:  [id number]   cc:   Madelin Rear. Sherwood Gambler, MD  Fax: [phone]

## 2010-12-01 NOTE — Procedures (Signed)
BYPASS GRAFT EVALUATION   INDICATION:  Followup evaluation of known peripheral vascular disease.   HISTORY:  Diabetes:  No.  Cardiac:  Coronary artery disease.  Hypertension:  Yes.  Smoking:  Former smoker.  Previous Surgery:  Right iliac PTA and stent in 1999, left fem-pop  bypass graft with Gore-Tex and saphenous vein in 1993.   SINGLE LEVEL ARTERIAL EXAM                               RIGHT              LEFT  Brachial:                    116                112  Anterior tibial:             88                 98  Posterior tibial:            94                 104  Peroneal:  Ankle/brachial index:        0.81               0.89   PREVIOUS ABI:  Date:  12/29/2007  RIGHT:  0.71  LEFT:  0.93   LOWER EXTREMITY BYPASS GRAFT DUPLEX EXAM:   DUPLEX:  On the right Doppler arterial waveforms are biphasic proximal  to and in the mid right external iliac artery.  They are elevated peak  systolic velocity (327 cm/second at the distal external iliac  artery/common femoral artery).  Doppler waveforms are monophasic distal to the stenosis in the right  external iliac artery.  On the left Doppler arterial waveforms are biphasic proximal to, within  and distal to the bypass graft.   IMPRESSION:  1. ABIs are stable from previous study bilaterally.  2. Patent left fem-pop bypass graft.  3. Greater than 50% right external iliac/common femoral artery      stenosis.   ___________________________________________  Larina Earthly, M.D.   MC/MEDQ  D:  07/10/2008  T:  07/10/2008  Job:  [id number]

## 2010-12-01 NOTE — Procedures (Signed)
CAROTID DUPLEX EXAM   INDICATION:  Followup evaluation of known carotid artery disease.   HISTORY:  Diabetes:  No.  Cardiac:  Coronary artery disease.  Hypertension:  Yes.  Smoking:  Quit in 1997.  Previous Surgery:  No.  CV History:  Previous duplex on 07/28/2007 revealed a 20-39% right ICA  stenosis and a 40-59% left ICA stenosis and left vertebral artery  occlusion.  Amaurosis Fugax No, Paresthesias No, Hemiparesis No                                       RIGHT             LEFT  Brachial systolic pressure:         116               112  Brachial Doppler waveforms:         Triphasic         Triphasic  Vertebral direction of flow:        Antegrade         Occluded  DUPLEX VELOCITIES (cm/sec)  CCA peak systolic                   129               75  ECA peak systolic                   184               144  ICA peak systolic                   101               144  ICA end diastolic                   43                55  PLAQUE MORPHOLOGY:                  Mixed             Calcified  irregular  PLAQUE AMOUNT:                      Mild              Moderate  PLAQUE LOCATION:                    Proximal ICA      Proximal ICA   IMPRESSION:  1. 20-39% right ICA stenosis.  2. 40-59% left ICA stenosis.  3. Occluded left vertebral artery.  4. No significant change from previous study performed 07/28/2007.   ___________________________________________  Larina Earthly, M.D.   MC/MEDQ  D:  07/10/2008  T:  07/10/2008  Job:  [id number]

## 2010-12-01 NOTE — Op Note (Signed)
NAME:  Gregory, [name]                ACCOUNT NO.:  [id number]   MEDICAL RECORD NO.:  [id number]          PATIENT TYPE:  AMB   LOCATION:  DAY                           FACILITY:  [facility]   PHYSICIAN:  R. Roetta Sessions, M.D. DATE OF BIRTH:  [birth year]   DATE OF PROCEDURE:  12/30/2006  DATE OF DISCHARGE:                               OPERATIVE REPORT   PROCEDURE:  EGD with biopsy.   INDICATIONS FOR PROCEDURE:  A 65 year old gentleman with vague symptoms  of nausea, upper abdominal pain and worsening reflux symptoms, started  on Aciphex recently with obliteration of the above-mentioned symptoms.  He did have cardiac workup Dr. Dietrich Pates and is not felt to have any  significant cardiac issues.  He does not have any dysphagia,  odynophagia, early satiety, no melena.  EGD is now being done.  This  approach has discussed with the patient length.  Potential risks,  benefits and alternatives have been reviewed, questions answered and he  is agreeable.  Please see documentation on the medical record.   PROCEDURE NOTE:  O2 saturation, blood pressure, pulse and respirations  monitored throughout the entire procedure.   CONSCIOUS SEDATION:  Versed 3 mg IV and Demerol 75 mg IV in divided  doses.  Cetacaine spray topical pharyngeal anesthesia.   INSTRUMENT:  Pentax video chip system.   FINDINGS:  Examination of the tubular esophagus revealed a 1 cm proximal  lesion in the esophagus which was basically salmon-colored epithelium  most consistent with ectopic gastric epithelium or an inlet patch,  please see photos.  There was also numerous 1-2 mm pale raised nodules  throughout the lining of the entire tubular esophagus.  Examination of  the EG junction demonstrates salmon-colored epithelium coming up a good  3-4 cm above what appeared to be the EG junction.  One segment of the  salmon-colored scalloped appearing appearance, it was subtle.  There was  also some leading edge erosions.  No obvious  neoplasm was seen.  EG  junction was patulous easily traversed.   Stomach:  Gastric cavity was emptied and insufflated well with air.  Thorough examination of the gastric mucosa including retroflexed view of  the proximal stomach and esophagogastric junction showed some nodularity  of the antrum of doubtful clinical significance and a small hiatal  hernia.  Pylorus was patent and easily traversed.  Examination of bulb  and second portion revealed no abnormalities.   THERAPEUTIC/DIAGNOSTIC MANEUVERS PERFORMED:  The nodularity of the  antrum was biopsied for histologic study.  Subsequently, the salmon-  colored epithelium and distal esophagus was biopsied multiple times (it  was notable that the antral biopsies and the distal esophageal biopsies  were submitted in the same container).  Subsequently one of the pale  nodules in the esophagus was biopsied separately for histologic study  and finally, the patch of salmon-colored epithelium proximal esophagus  was biopsied separately.  The patient tolerated the procedure well as  reactive to endoscopy.   IMPRESSION:  Multiple pale raised nodules throughout the esophagus  consistent with squamous papilloma, biopsied.  Salmon-colored epithelium  inlet patch consistent  with ectopic gastric mucosa, biopsied, proximal  esophagus.  Salmon-colored epithelium coming up from the distal with  minimal scalloping of the mucosa in this area of the leading edge of  uncertain significance consistent with Barrett's esophagus, biopsied.  Leading edge erosions consistent with erosive reflux esophagitis,  patulous esophagogastric junction, small hiatal hernia, nodularity of  the antral mucosa of doubtful clinical significance biopsied.  Remainder  of gastric mucosa appeared normal.  Patent pylorus, normal D1 and D2.   RECOMMENDATIONS:  1. Increase AcipHex to 20 mg orally b.i.d. for the next month then      drop back to once daily.  2. Reviewed antireflux  lifestyle diet with Gregory Eaton, provided him with      antireflux literature.  3. Follow-up on path.  4. Further recommendations to follow.      Gregory Eaton, M.D.  Electronically Signed     RMR/MEDQ  D:  12/30/2006  T:  12/30/2006  Job:  [id number]   cc:   Gregory Eaton, M.D.  Fax: [phone]

## 2010-12-01 NOTE — Procedures (Signed)
BYPASS GRAFT EVALUATION   INDICATION:  Followup, bilateral lower extremity PVD.   HISTORY:  Diabetes:  No.  Cardiac:  CAD.  Hypertension:  Yes.  Smoking:  Quit.  Previous Surgery:  Right iliac PTA/stent in 1999, left fem-pop bypass  graft with Gore-Tex and saphenous vein in 1993.   SINGLE LEVEL ARTERIAL EXAM                               RIGHT              LEFT  Brachial:                    120                118  Anterior tibial:             80                 110  Posterior tibial:            85                 100  Peroneal:  Ankle/brachial index:        0.71               0.93   PREVIOUS ABI:  Date: 07/28/2007  RIGHT:  >1.0  LEFT:  >1.0   LOWER EXTREMITY BYPASS GRAFT DUPLEX EXAM:   DUPLEX:  1. Elevated velocities in bilateral distal EIA and CFA, >300 cm/s.  2. Left Doppler arterial waveforms appear biphasic/monophasic proximal      to, within, and distal to bypass graft.   IMPRESSION:  1. Ankle brachial indices are significantly lower than previous study;      however, consistent with the study one year ago.  2. Patent left femoral/popliteal artery bypass graft.  3. Elevated velocities in right distal external iliac artery/common      femoral artery appear stable; however, left elevated velocities are      new finding.   ___________________________________________  Larina Earthly, M.D.   AS/MEDQ  D:  12/29/2007  T:  12/29/2007  Job:  [id number]

## 2010-12-01 NOTE — Letter (Signed)
January 25, 2007    [name], M.D.  [address]  [place], [place] [address]   RE:  Gregory Eaton, Gregory Eaton  MRN:  [id number]  /  DOB:  [birth year]   Dear Gregory Eaton:   Gregory Eaton returns to the office for continued assessment and treatment of  coronary disease and recent complaints of chest discomfort.  He  responded well to therapy with a PPI with virtually complete resolution  of the soreness that he was describing substernally.  He has occasional  sharp episodes of chest discomfort at the left sternal border that are  momentary.  Exercise tolerance has been good.  He feels generally well.   He underwent EGD by Dr. Jena Gauss.  Those records were reviewed.  He was  found to have findings consistent with Barrett's esophagus and GERD.  His dose of Aciphex was increased.   Gregory Eaton has resumed warfarin.  Anticoagulation has been therapeutic.  His other medications are unchanged.   PHYSICAL EXAMINATION:  A very pleasant, well-appearing gentleman.  The weight is 181, 4 pounds more than in May.  Blood pressure is 140/70,  heart rate is 70 and regular, respirations 16.  NECK:  No jugular venous distension, no carotid bruits.  LUNGS:  Clear.  CARDIAC:  Normal 1st and 2nd heart sounds, 4th heart sound present.  Normal PMI.  ABDOMEN:  Soft and nontender, no organomegaly.  EXTREMITIES:  No edema.   IMPRESSION:  Gregory Eaton is doing well with treatment for his GI issues.  I  see no reason to proceed with additional cardiac evaluation.  He has  been taking warfarin for many years following left iliofemoral vascular  surgery.  It is not clear that this is absolutely necessary.  I will  call to seek Dr. Bosie Helper opinion, and plan a return office visit with  Gregory Eaton in 5 months.  Stool for Hemoccult testing will be obtained in  light of chronic anticoagulation.    Sincerely,      [name], MD, [facility]  Electronically Signed    RMR/MedQ  DD: 01/25/2007  DT: 01/26/2007  Job #: [id number]   CC:     Larina Earthly, M.D.

## 2010-12-01 NOTE — Procedures (Signed)
CAROTID DUPLEX EXAM   INDICATION:  Followup evaluation of known carotid artery disease.   HISTORY:  Diabetes:  No.  Cardiac:  Coronary artery disease.  Hypertension:  Yes.  Smoking:  Quit in 1997.  Previous Surgery:  No.  CV History:  No.  Amaurosis Fugax No, Paresthesias No, Hemiparesis No                                       RIGHT             LEFT  Brachial systolic pressure:         142               141  Brachial Doppler waveforms:         WNL               WNL  Vertebral direction of flow:        Antegrade         Occluded  DUPLEX VELOCITIES (cm/sec)  CCA peak systolic                   131               108  ECA peak systolic                   198               223  ICA peak systolic                   107               190 mid  ICA end diastolic                   40                51  PLAQUE MORPHOLOGY:                  Heterogeneous     Calcified  PLAQUE AMOUNT:                      Mild              Mild  PLAQUE LOCATION:                    Bif/ICA           Bif/ICA   IMPRESSION:  1. 20-39% right internal carotid artery stenosis.  2. 40-59% left internal carotid artery stenosis.  3. No significant change from last study.   ___________________________________________  Larina Earthly, M.D.   AC/MEDQ  D:  12/20/2008  T:  12/20/2008  Job:  [id number]

## 2010-12-01 NOTE — Assessment & Plan Note (Signed)
NAMEMarland Kitchen  SALIOU, BARNIER                 CHART#:  [id number]   DATE:  10/24/2007                       DOB:  22-Jul-1945   CHIEF COMPLAINT:  Follow up for refills, food not digesting well.   SUBJECTIVE:  Mr. Gabbard is here for a followup. He was last seen on  July 06, 2007. He has a 1-year history of vague chest pressure and  discomfort behind his breastbone which he feels is related to his meals  and at times when he goes to bed at night. When he is eating he feels  like the food does not go down well. Sometimes it feels like it is  coming back up. He has to wait for this to pass. He is not sure whether  this is real or just his nerves. He is frustrated that it has been going  on for a year. He denies any vomiting, abdominal pain. His bowel  movements are regular. It feels like he is swollen in his chest at  times. He has previously been on Prevacid and Prilosec which did  reasonably well. He feels like Prevacid was better. Nexium caused him  diarrhea, and Protonix did not seem to help. Currently he is on Aciphex  20 mg b.i.d. He has been seen by Dr. Dietrich Pates who does not feel it is  cardiac in origin. He had a chest x-ray on January 7th which revealed  COPD.   CURRENT MEDICATIONS:  See updated list.   ALLERGIES:  ASPIRIN.   PHYSICAL EXAMINATION:  VITAL SIGNS:  Weight 184, up 3 pounds.  Temperature 97.6. Blood pressure is 140/78, pulse 80.  GENERAL:  Pleasant, obese Caucasian male in no acute distress.  SKIN:  Warm and dry, no jaundice.  ABDOMEN:  Positive bowel sounds. Abdomen is soft, nontender,  nondistended. No organomegaly or masses. No rebound or guarding.  CHEST:  He has some vague tenderness over the sternal body and just to  the left in the mid sternum.  LOWER EXTREMITIES:  No edema.   IMPRESSION:  Retrosternal chest discomfort and pressure, swelling-like  sensation. He feels like his food is not going down well. He has history  of Barrett's esophagus and longstanding  gastroesophageal reflux disease  with esophagogastroduodenoscopy back in August 2008. His symptoms are  very atypical for esophageal motility disorder. We will resume workup  with barium pill esophagram to make sure there are no strictures and  potentially see evidence of esophageal motility disorder.   PLAN:  1. Barium pill esophagram.  2. He will continue Aciphex 20 mg b.i.d. for now. He is almost due for      a refill. We will await his      barium pill esophagram results. We will decide which PPI he should      continue and fax prescription accordingly.       Tana Coast, P.A.  Electronically Signed     Kassie Mends, M.D.  Electronically Signed    LL/MEDQ  D:  10/24/2007  T:  10/24/2007  Job:  [id number]   cc:   [name], M.D.

## 2010-12-01 NOTE — Op Note (Signed)
NAME:  Gregory Eaton, Gregory Eaton                ACCOUNT NO.:  [id number]   MEDICAL RECORD NO.:  [id number]          PATIENT TYPE:  AMB   LOCATION:  DAY                           FACILITY:  [facility]   PHYSICIAN:  R. Roetta Sessions, M.D. DATE OF BIRTH:  Aug 21, 1945   DATE OF PROCEDURE:  03/17/2009  DATE OF DISCHARGE:                               OPERATIVE REPORT   PROCEDURES PERFORMED:  EGD with biopsy followed by colonoscopy with  snare polypectomy biopsy.   INDICATIONS FOR PROCEDURE:  A 65 year old gentleman with single episode  of large hematochezia.  Recently he had been constipated.  Constipation  well combated now with milk of magnesia and Metamucil.  Last colonoscopy  was back in 2000.  He was found to have some hemorrhoids and  diverticulosis.  He had inflammatory polyp removed.  Also has a history  of Barrett's esophagus and is overdue for surveillance.  He is not  having any dysphagia, but he does have some nonspecific chest discomfort  from time to time.  He has seen Dr. Dietrich Pates previously.  EGD and  colonoscopy are now being done.  Risks, benefits, alternatives and  limitations have been discussed, questions answered.  Please see the  documentation in the medical record.   PROCEDURE NOTE:  O2 saturation, blood pressure, pulse and respirations  were monitored throughout the entirety of both procedures.  Conscious  sedation:  Versed 4 mg IV, Demerol 100 mg IV in divided doses.  Cetacaine spray for topical pharyngeal anesthesia.  Instrument:  Pentax  video chip system.   FINDINGS:  EGD examination of the tubular esophagus revealed a  circumferential salmon-colored epithelium coming up to 30 cm from the  incisors.  This extended down to approximately 34 cm from the incisors,  but corresponded to the GE junction.  There was a 1 to 1.5 cm area of  the nodularity above the abnormal-appearing mucosa in the middle of this  salmon-colored epithelium which appeared to be suspicious to me.  Please  see photos.  EG junction was easily traversed.  Stomach:  Gastric cavity  was emptied, insufflated well with air.  Thorough examination of the  gastric mucosa including retroflexed proximal stomach, esophagogastric  junction demonstrated only a small to moderate size hiatal hernia,  gastric mucosa otherwise appeared entirely normal, pylorus patent,  easily traversed.  Examination of the bulb and second portion revealed  no abnormalities.  Therapeutic/diagnostic maneuvers performed:  The  leading edge of the Barrett's was biopsied multiple times.  The area of  nodularity well within the salmon-colored epithelium was biopsied  separately.  The patient tolerated the procedure well and was prepared  for colonoscopy.  Digital rectal exam revealed no abnormalities.  The  prep was adequate.  Colon:  Colonic mucosa was surveyed from the  rectosigmoid junction through the left, transverse and right colon, the  appendiceal orifice, ileocecal valve and cecum.  These structures were  well seen and photographed for the record.  From this level the scope  was slowly and cautiously withdrawn.  All previous mentioned mucosal  surfaces were again seen.  There was  a 6-mm polyp on a stalk at the  splenic flexure which was hot snared and recovered through the scope.  The patient's sigmoid diverticula and diffuse petechiae and some pale  appearance of the mucosa throughout the sigmoid with some loss of a  normal vascular pattern.  There was no granularity, erosion or  ulceration.  The remainder of the colonic mucosa appeared normal.  Biopsies of the sigmoid mucosa were taken separately.  Scope was pulled  down into the rectum with thorough examination of the rectal mucosa  including retroflexed view of the anal verge demonstrated no  abnormalities.  The patient tolerated the procedure well.  Cecal  withdrawal time 10 minutes.   IMPRESSION:  1. Esophagogastroduodenoscopy 4 cm segment of salmon-colored       epithelium distal esophagus suspicious and most likely representing      Barrett's esophagus as previously seen status post biopsy.  Area of      suspicious nodularity within this segment of Barrett's esophagus      biopsied separately.  2. Small to moderate size hiatal hernia, otherwise normal stomach D1      and D2.  3. Colonoscopic findings normal rectum, sigmoid diverticula, some pale      sigmoid mucosa with diffuse petechiae status post biopsy,      pedunculated polyp at the splenic flexure status post hot snare      removal.  Remainder of colonic mucosa appeared normal.   RECOMMENDATIONS:  1. Continue __________.  2. Follow-up on path.  3. No aspirin or arthritis medications for the next 5 days.  4. Diverticulosis and polyp literature provided Mr. Russom.   Regardless of path, the nodular mucosa within the Barrett's esophagus  will deserve further evaluation at least via an EUS in the near future.   As far as the etiology of his hematochezia, I suspect he may well have  suffered a diverticular bleed.  He could have some microscopic colitis,  but this clinical presentation would be atypical.  Further  recommendations to follow.      Jonathon Bellows, M.D.  Electronically Signed     RMR/MEDQ  D:  03/17/2009  T:  03/17/2009  Job:  [id number]   cc:   Madelin Rear. Sherwood Gambler, MD  Fax: [phone]

## 2010-12-04 NOTE — Op Note (Signed)
NAME:  JOANDY, BURGET                          ACCOUNT NO.:  [id number]   MEDICAL RECORD NO.:  [id number]                   PATIENT TYPE:  AMB   LOCATION:  DAY                                  FACILITY:  [facility]   PHYSICIAN:  Lionel December, M.D.                 DATE OF BIRTH:  1945/11/30   DATE OF PROCEDURE:  DATE OF DISCHARGE:                                 OPERATIVE REPORT   PROCEDURE:  Total colonoscopy.   ENDOSCOPIST:  Lionel December, M.D.   INDICATIONS:  Dominick is a 65 year old Caucasian male who is undergoing  screening colonoscopy.  He does give history of intermittent hematochezia  and he passed a small amount of blood and/or clot 2 days ago.  Family  history is negative for CRC.   The procedure and risks were reviewed with the patient and informed consent  was obtained.   PREOPERATIVE MEDICATIONS:  Demerol 50 mg IV and Versed 4 mg IV and atropine  0.3 mg IV.   FINDINGS:  Procedure performed in endoscopy suite.  The patient's vital  signs and O2 saturation were monitored during the procedure.  He had  transient drop in his heart rate and blood pressure probably due to Demerol  and responded quickly to atropine.  The patient was placed in the left  lateral recumbent position and rectal examination performed.  No abnormality  noted on external or digital exam.   The scope was placed in the rectum and advanced under vision into the  sigmoid colon and beyond.  Preparation was satisfactory.  Scope was passed  to the cecum which was identified by appendiceal orifice and the ileocecal  valve.  Pictures were taken for the record.  As the scope was withdrawn the  colonic mucosa was carefully examined.  There were 3 small polyps that were  ablated by a cold biopsy; 1 at the transverse colon and 2 others were at the  distal sigmoid colon.  These were ablated by cold biopsy.  The ones in the  sigmoid colon were submitted in 1 container.  A few diverticula were noted  in the  sigmoid colon.  The rectal mucosa was normal.   The scope was retroflexed to examine the anorectal junction and hemorrhoids  were noted below the dentate line.  The endoscope was straightened and  withdrawn.  The patient tolerated the procedure well.   FINAL DIAGNOSES:  1. Examination performed to cecum.  2. A few diverticula at the sigmoid colon.  3. Three small polyps that were ablated by a cold biopsy.  One was at the     transverse colon and 2 at the sigmoid colon.  4. Small external hemorrhoids.   RECOMMENDATIONS:  1. High fiber diet.  2.     Citrucel 1 tablespoonful daily.  3. He will resume his Coumadin at usual dose today.  I will be contacting  the patient with biopsy results and further recommendations.      ___________________________________________                                            Lionel December, M.D.   NR/MEDQ  D:  04/19/2003  T:  04/19/2003  Job:  [id number]   cc:   [name], M.D.  [address]  [place]  [place] [address]  Fax: [phone]

## 2010-12-04 NOTE — Letter (Signed)
August 30, 2006    [name], M.D.  [address]  [place], [place] [address]   RE:  Gregory, Eaton  MRN:  [id number]  /  DOB:  [birth year]   Dear [name]:   [name] returns to the office following a recent visit to the emergency  department at [facility].  He noticed a brief, severe episode of left  chest pain radiating down to the left leg.  This lasted a matter of  seconds.  He had some lesser discomfort, thereafter, and profound  weakness prompting him to seek evaluation.  Cardiac markers and other  laboratory tests were unremarkable in the emergency department.  The  suggestion was made to him that his symptoms could be musculoskeletal.   Some weeks ago, he was seen in your office on a number of occasions for  epigastric discomfort radiating up to the chest.  He ultimately  responded to AcipHex.  He does have a history of GERD.   Finally, he describes an episode of dizziness while he was climbing up a  ladder.  He was able to get to the ground, but felt as if he would lose  consciousness.  He did not.  He ate some high sodium food and  subsequently felt better.  This was not a warm day and he was not  perspiring.   Current medications include warfarin as directed for peripheral vascular  disease, Benicar 20 mg daily, aspirin 81 mg daily, Metoprolol 50 mg  daily, Atorvastatin 40 mg daily.   A lipid profile eight months ago was excellent.   On exam, this is a pleasant gentleman in no acute distress.  The weight  is 177, 2 pounds more than last year.  Blood pressure 115/70 without  orthostatic change, heart rate 85 and regular, respirations 16.  NECK:  No jugular venous distention.  LUNGS:  Clear with some prolongation of the expiratory phase.  CARDIAC:  Normal first and second heart sounds; minimal early systolic  murmur present.  ABDOMEN:  Soft and nontender; no bruits; no organomegaly.  EXTREMITIES:  No edema; distal pulses intact.   IMPRESSION:  [name] has had a  variety of chest discomfort and some  light headedness.  The etiology for these symptoms is not clear.  He did  have a good stress nuclear study less than two years ago.  I would not  be inclined to pursue additional testing at this time but I have  encouraged him to call should he develop more problems.  Otherwise, I  will see this nice gentleman again in four months.  Vaccinations are up  to date.    Sincerely,      [name], MD, [name]  Electronically Signed    RMR/MedQ  DD: 08/30/2006  DT: 08/30/2006  Job #: [id number]

## 2010-12-04 NOTE — Procedures (Signed)
NAMELUCAH, PETTA NO.:  [id number]   MEDICAL RECORD NO.:  [id number]          PATIENT TYPE:   LOCATION:                                 FACILITY:   PHYSICIAN:  Vida Roller, M.D.   DATE OF BIRTH:  1945/08/13   DATE OF PROCEDURE:  DATE OF DISCHARGE:                                    STRESS TEST   HISTORY:  Mr. Asch is a 65 year old gentleman with coronary artery disease  status post coronary artery bypass grafting in 1998.  He also has peripheral  vascular disease status post bypass graft on the left in 1994 and status  post stent to the right in 1999.  The patient now presents with complaints  of chest tightness and leg discomfort.   BASELINE DATA:  Electrocardiogram reveals a sinus rhythm at 68 beats per  minute with a first-degree AV block.  Blood pressure is 130/80.   Forty-five milligrams of adenosine was infused over a four-minute protocol  with Myoview injected at three minutes.  The patient reported flushed and  shortness of breath, which resolved in recovery.  EKG revealed no  arrhythmias.  No ischemic changes were noted.   Final images and results are pending MD review.      AB/MEDQ  D:  10/28/2004  T:  10/28/2004  Job:  [id number]

## 2011-04-23 LAB — CBC
HCT: 46.7 % (ref 39.0–52.0)
Hemoglobin: 15.4 g/dL (ref 13.0–17.0)
Platelets: 312 10*3/uL (ref 150–400)
RBC: 5.28 MIL/uL (ref 4.22–5.81)
WBC: 8.6 10*3/uL (ref 4.0–10.5)

## 2011-04-23 LAB — BASIC METABOLIC PANEL
BUN: 14 mg/dL (ref 6–23)
GFR calc Af Amer: >60 mL/min (ref >60)
GFR calc non Af Amer: >60 mL/min (ref >60)
Potassium: 3.9 mEq/L (ref 3.5–5.1)
Sodium: 139 mEq/L (ref 135–145)

## 2011-06-18 ENCOUNTER — Other Ambulatory Visit: Payer: Self-pay | Admitting: Internal Medicine

## 2011-07-22 ENCOUNTER — Ambulatory Visit (INDEPENDENT_AMBULATORY_CARE_PROVIDER_SITE_OTHER): Payer: Medicare Other | Admitting: Otolaryngology

## 2011-07-22 DIAGNOSIS — H608X9 Other otitis externa, unspecified ear: Secondary | ICD-10-CM | POA: Diagnosis not present

## 2011-07-26 DIAGNOSIS — H409 Unspecified glaucoma: Secondary | ICD-10-CM | POA: Diagnosis not present

## 2011-07-26 DIAGNOSIS — H4011X0 Primary open-angle glaucoma, stage unspecified: Secondary | ICD-10-CM | POA: Diagnosis not present

## 2011-07-27 DIAGNOSIS — M545 Low back pain, unspecified: Secondary | ICD-10-CM | POA: Diagnosis not present

## 2011-07-27 DIAGNOSIS — S7000XA Contusion of unspecified hip, initial encounter: Secondary | ICD-10-CM | POA: Diagnosis not present

## 2011-07-28 DIAGNOSIS — I2510 Atherosclerotic heart disease of native coronary artery without angina pectoris: Secondary | ICD-10-CM | POA: Diagnosis not present

## 2011-07-28 DIAGNOSIS — Z9889 Other specified postprocedural states: Secondary | ICD-10-CM | POA: Diagnosis not present

## 2011-07-28 DIAGNOSIS — Z1289 Encounter for screening for malignant neoplasm of other sites: Secondary | ICD-10-CM | POA: Diagnosis not present

## 2011-07-28 DIAGNOSIS — I739 Peripheral vascular disease, unspecified: Secondary | ICD-10-CM | POA: Diagnosis not present

## 2011-07-28 DIAGNOSIS — Z951 Presence of aortocoronary bypass graft: Secondary | ICD-10-CM | POA: Diagnosis not present

## 2011-07-28 DIAGNOSIS — J438 Other emphysema: Secondary | ICD-10-CM | POA: Diagnosis not present

## 2011-07-28 DIAGNOSIS — C155 Malignant neoplasm of lower third of esophagus: Secondary | ICD-10-CM | POA: Diagnosis not present

## 2011-07-28 DIAGNOSIS — E279 Disorder of adrenal gland, unspecified: Secondary | ICD-10-CM | POA: Diagnosis not present

## 2011-07-28 DIAGNOSIS — R911 Solitary pulmonary nodule: Secondary | ICD-10-CM | POA: Diagnosis not present

## 2011-08-18 ENCOUNTER — Telehealth: Payer: Self-pay

## 2011-08-18 DIAGNOSIS — I6529 Occlusion and stenosis of unspecified carotid artery: Secondary | ICD-10-CM

## 2011-08-18 DIAGNOSIS — I739 Peripheral vascular disease, unspecified: Secondary | ICD-10-CM

## 2011-08-18 NOTE — Telephone Encounter (Signed)
Received call from pt.  C/o having had episode of pain in left groin that woke him up the past 2 days.  Denies any inflammation or swelling in left groin.  Has hx of left fem-pop bypass surgery in 1993.  Pt. denies any swelling of left lower extremity; denies any change in temperature left leg, or pain in lower leg.  Reports episode of a sharp pain, at times, starting in left groin and radiating to left knee.  Denies pain at present.  Stated hasn't been able to follow-up due to treatment for cancer. Last BP graft duplex 12/2008. Advised will discuss w/ Dr. Arbie Cookey 1/31, and will call pt.to schedule appt./CP. RN. Discussed above pt symptoms w/ Dr. Arbie Cookey.  Rec'd v.o. to schedule for left LE bypass graft duplex, ABI's and f/u office visit. Pt.also followed for Carotid disease, last Carotid duplex in our office 12/2008/ will also schedule this.

## 2011-09-10 DIAGNOSIS — H409 Unspecified glaucoma: Secondary | ICD-10-CM | POA: Diagnosis not present

## 2011-09-10 DIAGNOSIS — H02839 Dermatochalasis of unspecified eye, unspecified eyelid: Secondary | ICD-10-CM | POA: Diagnosis not present

## 2011-09-10 DIAGNOSIS — H4011X0 Primary open-angle glaucoma, stage unspecified: Secondary | ICD-10-CM | POA: Diagnosis not present

## 2011-09-17 DIAGNOSIS — M9981 Other biomechanical lesions of cervical region: Secondary | ICD-10-CM | POA: Diagnosis not present

## 2011-09-17 DIAGNOSIS — M5030 Other cervical disc degeneration, unspecified cervical region: Secondary | ICD-10-CM | POA: Diagnosis not present

## 2011-09-17 DIAGNOSIS — IMO0002 Reserved for concepts with insufficient information to code with codable children: Secondary | ICD-10-CM | POA: Diagnosis not present

## 2011-09-17 DIAGNOSIS — M999 Biomechanical lesion, unspecified: Secondary | ICD-10-CM | POA: Diagnosis not present

## 2011-09-22 DIAGNOSIS — J019 Acute sinusitis, unspecified: Secondary | ICD-10-CM | POA: Diagnosis not present

## 2011-09-28 ENCOUNTER — Other Ambulatory Visit: Payer: Medicare Other

## 2011-09-28 ENCOUNTER — Ambulatory Visit: Payer: Medicare Other | Admitting: Vascular Surgery

## 2011-10-06 DIAGNOSIS — I7090 Unspecified atherosclerosis: Secondary | ICD-10-CM | POA: Diagnosis not present

## 2011-10-06 DIAGNOSIS — R911 Solitary pulmonary nodule: Secondary | ICD-10-CM | POA: Diagnosis not present

## 2011-10-06 DIAGNOSIS — K7689 Other specified diseases of liver: Secondary | ICD-10-CM | POA: Diagnosis not present

## 2011-10-06 DIAGNOSIS — C155 Malignant neoplasm of lower third of esophagus: Secondary | ICD-10-CM | POA: Insufficient documentation

## 2011-10-06 DIAGNOSIS — I2510 Atherosclerotic heart disease of native coronary artery without angina pectoris: Secondary | ICD-10-CM | POA: Diagnosis not present

## 2011-10-06 DIAGNOSIS — J438 Other emphysema: Secondary | ICD-10-CM | POA: Diagnosis not present

## 2011-10-06 DIAGNOSIS — Z951 Presence of aortocoronary bypass graft: Secondary | ICD-10-CM | POA: Diagnosis not present

## 2011-10-22 DIAGNOSIS — H409 Unspecified glaucoma: Secondary | ICD-10-CM | POA: Diagnosis not present

## 2011-10-22 DIAGNOSIS — H2510 Age-related nuclear cataract, unspecified eye: Secondary | ICD-10-CM | POA: Diagnosis not present

## 2011-10-22 DIAGNOSIS — H4011X0 Primary open-angle glaucoma, stage unspecified: Secondary | ICD-10-CM | POA: Diagnosis not present

## 2011-11-05 DIAGNOSIS — J069 Acute upper respiratory infection, unspecified: Secondary | ICD-10-CM | POA: Diagnosis not present

## 2011-11-05 DIAGNOSIS — R21 Rash and other nonspecific skin eruption: Secondary | ICD-10-CM | POA: Diagnosis not present

## 2011-11-05 DIAGNOSIS — IMO0002 Reserved for concepts with insufficient information to code with codable children: Secondary | ICD-10-CM | POA: Diagnosis not present

## 2011-11-09 DIAGNOSIS — I6529 Occlusion and stenosis of unspecified carotid artery: Secondary | ICD-10-CM | POA: Diagnosis not present

## 2011-11-09 DIAGNOSIS — I739 Peripheral vascular disease, unspecified: Secondary | ICD-10-CM | POA: Diagnosis not present

## 2011-11-09 DIAGNOSIS — Z9289 Personal history of other medical treatment: Secondary | ICD-10-CM

## 2011-11-09 DIAGNOSIS — I70219 Atherosclerosis of native arteries of extremities with intermittent claudication, unspecified extremity: Secondary | ICD-10-CM | POA: Diagnosis not present

## 2011-11-09 HISTORY — DX: Personal history of other medical treatment: Z92.89

## 2011-11-24 DIAGNOSIS — Z9289 Personal history of other medical treatment: Secondary | ICD-10-CM

## 2011-11-24 DIAGNOSIS — Z951 Presence of aortocoronary bypass graft: Secondary | ICD-10-CM | POA: Diagnosis not present

## 2011-11-24 DIAGNOSIS — I2510 Atherosclerotic heart disease of native coronary artery without angina pectoris: Secondary | ICD-10-CM | POA: Diagnosis not present

## 2011-11-24 DIAGNOSIS — E782 Mixed hyperlipidemia: Secondary | ICD-10-CM | POA: Diagnosis not present

## 2011-11-24 DIAGNOSIS — I10 Essential (primary) hypertension: Secondary | ICD-10-CM | POA: Diagnosis not present

## 2011-11-24 HISTORY — DX: Personal history of other medical treatment: Z92.89

## 2011-12-07 DIAGNOSIS — M9981 Other biomechanical lesions of cervical region: Secondary | ICD-10-CM | POA: Diagnosis not present

## 2011-12-07 DIAGNOSIS — M5030 Other cervical disc degeneration, unspecified cervical region: Secondary | ICD-10-CM | POA: Diagnosis not present

## 2011-12-07 DIAGNOSIS — IMO0002 Reserved for concepts with insufficient information to code with codable children: Secondary | ICD-10-CM | POA: Diagnosis not present

## 2011-12-07 DIAGNOSIS — M999 Biomechanical lesion, unspecified: Secondary | ICD-10-CM | POA: Diagnosis not present

## 2011-12-08 DIAGNOSIS — E782 Mixed hyperlipidemia: Secondary | ICD-10-CM | POA: Diagnosis not present

## 2011-12-08 DIAGNOSIS — I6529 Occlusion and stenosis of unspecified carotid artery: Secondary | ICD-10-CM | POA: Diagnosis not present

## 2011-12-08 DIAGNOSIS — I119 Hypertensive heart disease without heart failure: Secondary | ICD-10-CM | POA: Diagnosis not present

## 2011-12-08 DIAGNOSIS — I2510 Atherosclerotic heart disease of native coronary artery without angina pectoris: Secondary | ICD-10-CM | POA: Diagnosis not present

## 2011-12-22 DIAGNOSIS — H409 Unspecified glaucoma: Secondary | ICD-10-CM | POA: Diagnosis not present

## 2011-12-22 DIAGNOSIS — H4011X0 Primary open-angle glaucoma, stage unspecified: Secondary | ICD-10-CM | POA: Diagnosis not present

## 2011-12-22 DIAGNOSIS — H02839 Dermatochalasis of unspecified eye, unspecified eyelid: Secondary | ICD-10-CM | POA: Diagnosis not present

## 2012-01-17 ENCOUNTER — Other Ambulatory Visit: Payer: Self-pay

## 2012-01-17 MED ORDER — PANTOPRAZOLE SODIUM 40 MG PO TBEC: 40.0000 mg | DELAYED_RELEASE_TABLET | Freq: Every day | ORAL | Status: DC

## 2012-01-17 NOTE — Telephone Encounter (Signed)
Reminder in epic to follow up in 3 months for GERD

## 2012-01-17 NOTE — Telephone Encounter (Signed)
Patient needs OV in three months f/u GERD.

## 2012-01-25 DIAGNOSIS — C155 Malignant neoplasm of lower third of esophagus: Secondary | ICD-10-CM | POA: Diagnosis not present

## 2012-01-25 DIAGNOSIS — C159 Malignant neoplasm of esophagus, unspecified: Secondary | ICD-10-CM | POA: Diagnosis not present

## 2012-01-25 DIAGNOSIS — Z1289 Encounter for screening for malignant neoplasm of other sites: Secondary | ICD-10-CM | POA: Diagnosis not present

## 2012-01-25 DIAGNOSIS — R978 Other abnormal tumor markers: Secondary | ICD-10-CM | POA: Diagnosis not present

## 2012-02-01 DIAGNOSIS — K219 Gastro-esophageal reflux disease without esophagitis: Secondary | ICD-10-CM | POA: Diagnosis not present

## 2012-02-01 DIAGNOSIS — IMO0002 Reserved for concepts with insufficient information to code with codable children: Secondary | ICD-10-CM | POA: Diagnosis not present

## 2012-02-01 DIAGNOSIS — E785 Hyperlipidemia, unspecified: Secondary | ICD-10-CM | POA: Diagnosis not present

## 2012-02-01 DIAGNOSIS — F411 Generalized anxiety disorder: Secondary | ICD-10-CM | POA: Diagnosis not present

## 2012-02-01 DIAGNOSIS — Z125 Encounter for screening for malignant neoplasm of prostate: Secondary | ICD-10-CM | POA: Diagnosis not present

## 2012-02-01 DIAGNOSIS — I10 Essential (primary) hypertension: Secondary | ICD-10-CM | POA: Diagnosis not present

## 2012-02-01 DIAGNOSIS — I2510 Atherosclerotic heart disease of native coronary artery without angina pectoris: Secondary | ICD-10-CM | POA: Diagnosis not present

## 2012-02-01 DIAGNOSIS — Z0000 Encounter for general adult medical examination without abnormal findings: Secondary | ICD-10-CM | POA: Diagnosis not present

## 2012-02-17 DIAGNOSIS — H02839 Dermatochalasis of unspecified eye, unspecified eyelid: Secondary | ICD-10-CM | POA: Diagnosis not present

## 2012-02-17 DIAGNOSIS — H00029 Hordeolum internum unspecified eye, unspecified eyelid: Secondary | ICD-10-CM | POA: Diagnosis not present

## 2012-02-17 DIAGNOSIS — H4011X0 Primary open-angle glaucoma, stage unspecified: Secondary | ICD-10-CM | POA: Diagnosis not present

## 2012-02-17 DIAGNOSIS — T1500XA Foreign body in cornea, unspecified eye, initial encounter: Secondary | ICD-10-CM | POA: Diagnosis not present

## 2012-02-17 DIAGNOSIS — H409 Unspecified glaucoma: Secondary | ICD-10-CM | POA: Diagnosis not present

## 2012-02-18 DIAGNOSIS — Z8509 Personal history of malignant neoplasm of other digestive organs: Secondary | ICD-10-CM | POA: Diagnosis not present

## 2012-02-18 DIAGNOSIS — C155 Malignant neoplasm of lower third of esophagus: Secondary | ICD-10-CM | POA: Diagnosis not present

## 2012-02-22 DIAGNOSIS — M9981 Other biomechanical lesions of cervical region: Secondary | ICD-10-CM | POA: Diagnosis not present

## 2012-02-22 DIAGNOSIS — M999 Biomechanical lesion, unspecified: Secondary | ICD-10-CM | POA: Diagnosis not present

## 2012-02-22 DIAGNOSIS — M546 Pain in thoracic spine: Secondary | ICD-10-CM | POA: Diagnosis not present

## 2012-02-22 DIAGNOSIS — M5030 Other cervical disc degeneration, unspecified cervical region: Secondary | ICD-10-CM | POA: Diagnosis not present

## 2012-03-27 ENCOUNTER — Encounter: Payer: Self-pay | Admitting: Internal Medicine

## 2012-04-07 DIAGNOSIS — R911 Solitary pulmonary nodule: Secondary | ICD-10-CM | POA: Diagnosis not present

## 2012-04-07 DIAGNOSIS — Z8501 Personal history of malignant neoplasm of esophagus: Secondary | ICD-10-CM | POA: Diagnosis not present

## 2012-04-07 DIAGNOSIS — I708 Atherosclerosis of other arteries: Secondary | ICD-10-CM | POA: Diagnosis not present

## 2012-04-07 DIAGNOSIS — Z951 Presence of aortocoronary bypass graft: Secondary | ICD-10-CM | POA: Diagnosis not present

## 2012-04-07 DIAGNOSIS — I2510 Atherosclerotic heart disease of native coronary artery without angina pectoris: Secondary | ICD-10-CM | POA: Diagnosis not present

## 2012-04-07 DIAGNOSIS — I7090 Unspecified atherosclerosis: Secondary | ICD-10-CM | POA: Diagnosis not present

## 2012-04-07 DIAGNOSIS — C155 Malignant neoplasm of lower third of esophagus: Secondary | ICD-10-CM | POA: Diagnosis not present

## 2012-04-07 DIAGNOSIS — Z483 Aftercare following surgery for neoplasm: Secondary | ICD-10-CM | POA: Diagnosis not present

## 2012-04-07 DIAGNOSIS — J438 Other emphysema: Secondary | ICD-10-CM | POA: Diagnosis not present

## 2012-05-17 DIAGNOSIS — I2510 Atherosclerotic heart disease of native coronary artery without angina pectoris: Secondary | ICD-10-CM | POA: Diagnosis not present

## 2012-05-17 DIAGNOSIS — W268XXA Contact with other sharp object(s), not elsewhere classified, initial encounter: Secondary | ICD-10-CM | POA: Diagnosis not present

## 2012-05-17 DIAGNOSIS — S6990XA Unspecified injury of unspecified wrist, hand and finger(s), initial encounter: Secondary | ICD-10-CM | POA: Diagnosis not present

## 2012-05-17 DIAGNOSIS — E7800 Pure hypercholesterolemia, unspecified: Secondary | ICD-10-CM | POA: Diagnosis not present

## 2012-05-17 DIAGNOSIS — S6980XA Other specified injuries of unspecified wrist, hand and finger(s), initial encounter: Secondary | ICD-10-CM | POA: Diagnosis not present

## 2012-05-17 DIAGNOSIS — Z8501 Personal history of malignant neoplasm of esophagus: Secondary | ICD-10-CM | POA: Diagnosis not present

## 2012-05-17 DIAGNOSIS — S61209A Unspecified open wound of unspecified finger without damage to nail, initial encounter: Secondary | ICD-10-CM | POA: Diagnosis not present

## 2012-05-17 DIAGNOSIS — Z951 Presence of aortocoronary bypass graft: Secondary | ICD-10-CM | POA: Diagnosis not present

## 2012-05-26 DIAGNOSIS — Z4802 Encounter for removal of sutures: Secondary | ICD-10-CM | POA: Diagnosis not present

## 2012-06-01 DIAGNOSIS — H409 Unspecified glaucoma: Secondary | ICD-10-CM | POA: Diagnosis not present

## 2012-06-01 DIAGNOSIS — H4011X0 Primary open-angle glaucoma, stage unspecified: Secondary | ICD-10-CM | POA: Diagnosis not present

## 2012-06-01 DIAGNOSIS — H02839 Dermatochalasis of unspecified eye, unspecified eyelid: Secondary | ICD-10-CM | POA: Diagnosis not present

## 2012-06-01 DIAGNOSIS — H2510 Age-related nuclear cataract, unspecified eye: Secondary | ICD-10-CM | POA: Diagnosis not present

## 2012-06-05 DIAGNOSIS — L821 Other seborrheic keratosis: Secondary | ICD-10-CM | POA: Diagnosis not present

## 2012-06-05 DIAGNOSIS — D239 Other benign neoplasm of skin, unspecified: Secondary | ICD-10-CM | POA: Diagnosis not present

## 2012-06-07 DIAGNOSIS — M5030 Other cervical disc degeneration, unspecified cervical region: Secondary | ICD-10-CM | POA: Diagnosis not present

## 2012-06-07 DIAGNOSIS — M999 Biomechanical lesion, unspecified: Secondary | ICD-10-CM | POA: Diagnosis not present

## 2012-06-07 DIAGNOSIS — M9981 Other biomechanical lesions of cervical region: Secondary | ICD-10-CM | POA: Diagnosis not present

## 2012-06-07 DIAGNOSIS — M546 Pain in thoracic spine: Secondary | ICD-10-CM | POA: Diagnosis not present

## 2012-06-20 DIAGNOSIS — Z23 Encounter for immunization: Secondary | ICD-10-CM | POA: Diagnosis not present

## 2012-06-22 ENCOUNTER — Ambulatory Visit (INDEPENDENT_AMBULATORY_CARE_PROVIDER_SITE_OTHER): Payer: Medicare Other | Admitting: Otolaryngology

## 2012-06-22 DIAGNOSIS — R040 Epistaxis: Secondary | ICD-10-CM | POA: Diagnosis not present

## 2012-06-23 DIAGNOSIS — IMO0002 Reserved for concepts with insufficient information to code with codable children: Secondary | ICD-10-CM | POA: Diagnosis not present

## 2012-06-23 DIAGNOSIS — J019 Acute sinusitis, unspecified: Secondary | ICD-10-CM | POA: Diagnosis not present

## 2012-06-23 DIAGNOSIS — I2510 Atherosclerotic heart disease of native coronary artery without angina pectoris: Secondary | ICD-10-CM | POA: Diagnosis not present

## 2012-07-13 ENCOUNTER — Encounter: Payer: Self-pay | Admitting: Vascular Surgery

## 2012-07-14 DIAGNOSIS — R040 Epistaxis: Secondary | ICD-10-CM | POA: Diagnosis not present

## 2012-07-27 ENCOUNTER — Ambulatory Visit (INDEPENDENT_AMBULATORY_CARE_PROVIDER_SITE_OTHER): Payer: Medicare Other | Admitting: Otolaryngology

## 2012-07-27 DIAGNOSIS — E782 Mixed hyperlipidemia: Secondary | ICD-10-CM | POA: Diagnosis not present

## 2012-07-27 DIAGNOSIS — R040 Epistaxis: Secondary | ICD-10-CM | POA: Diagnosis not present

## 2012-07-27 DIAGNOSIS — I119 Hypertensive heart disease without heart failure: Secondary | ICD-10-CM | POA: Diagnosis not present

## 2012-07-27 DIAGNOSIS — I2510 Atherosclerotic heart disease of native coronary artery without angina pectoris: Secondary | ICD-10-CM | POA: Diagnosis not present

## 2012-07-27 DIAGNOSIS — I6529 Occlusion and stenosis of unspecified carotid artery: Secondary | ICD-10-CM | POA: Diagnosis not present

## 2012-08-29 DIAGNOSIS — Z483 Aftercare following surgery for neoplasm: Secondary | ICD-10-CM | POA: Diagnosis not present

## 2012-08-29 DIAGNOSIS — C155 Malignant neoplasm of lower third of esophagus: Secondary | ICD-10-CM | POA: Diagnosis not present

## 2012-08-29 DIAGNOSIS — Z8501 Personal history of malignant neoplasm of esophagus: Secondary | ICD-10-CM | POA: Diagnosis not present

## 2012-08-29 DIAGNOSIS — N400 Enlarged prostate without lower urinary tract symptoms: Secondary | ICD-10-CM | POA: Diagnosis not present

## 2012-08-29 DIAGNOSIS — I10 Essential (primary) hypertension: Secondary | ICD-10-CM | POA: Diagnosis not present

## 2012-09-06 DIAGNOSIS — H4011X0 Primary open-angle glaucoma, stage unspecified: Secondary | ICD-10-CM | POA: Diagnosis not present

## 2012-09-06 DIAGNOSIS — H409 Unspecified glaucoma: Secondary | ICD-10-CM | POA: Diagnosis not present

## 2012-10-26 ENCOUNTER — Ambulatory Visit (INDEPENDENT_AMBULATORY_CARE_PROVIDER_SITE_OTHER): Payer: Medicare Other | Admitting: Otolaryngology

## 2012-10-26 DIAGNOSIS — R040 Epistaxis: Secondary | ICD-10-CM | POA: Diagnosis not present

## 2012-11-09 DIAGNOSIS — R05 Cough: Secondary | ICD-10-CM | POA: Diagnosis not present

## 2012-11-09 DIAGNOSIS — IMO0002 Reserved for concepts with insufficient information to code with codable children: Secondary | ICD-10-CM | POA: Diagnosis not present

## 2012-11-09 DIAGNOSIS — J019 Acute sinusitis, unspecified: Secondary | ICD-10-CM | POA: Diagnosis not present

## 2012-11-09 DIAGNOSIS — R059 Cough, unspecified: Secondary | ICD-10-CM | POA: Diagnosis not present

## 2012-12-20 DIAGNOSIS — H6690 Otitis media, unspecified, unspecified ear: Secondary | ICD-10-CM | POA: Diagnosis not present

## 2012-12-20 DIAGNOSIS — H60399 Other infective otitis externa, unspecified ear: Secondary | ICD-10-CM | POA: Diagnosis not present

## 2013-01-01 DIAGNOSIS — H4011X0 Primary open-angle glaucoma, stage unspecified: Secondary | ICD-10-CM | POA: Diagnosis not present

## 2013-01-25 ENCOUNTER — Encounter: Payer: Self-pay | Admitting: *Deleted

## 2013-01-26 ENCOUNTER — Encounter: Payer: Self-pay | Admitting: Cardiovascular Disease

## 2013-01-29 ENCOUNTER — Ambulatory Visit (INDEPENDENT_AMBULATORY_CARE_PROVIDER_SITE_OTHER): Payer: Medicare Other | Admitting: Cardiovascular Disease

## 2013-01-29 ENCOUNTER — Encounter: Payer: Self-pay | Admitting: Cardiovascular Disease

## 2013-01-29 VITALS — BP 150/80 | Ht 68.0 in | Wt 158.7 lb

## 2013-01-29 DIAGNOSIS — E782 Mixed hyperlipidemia: Secondary | ICD-10-CM

## 2013-01-29 DIAGNOSIS — I739 Peripheral vascular disease, unspecified: Secondary | ICD-10-CM | POA: Diagnosis not present

## 2013-01-29 DIAGNOSIS — I10 Essential (primary) hypertension: Secondary | ICD-10-CM

## 2013-01-29 DIAGNOSIS — Z79899 Other long term (current) drug therapy: Secondary | ICD-10-CM

## 2013-01-29 DIAGNOSIS — R0989 Other specified symptoms and signs involving the circulatory and respiratory systems: Secondary | ICD-10-CM | POA: Diagnosis not present

## 2013-01-29 DIAGNOSIS — E785 Hyperlipidemia, unspecified: Secondary | ICD-10-CM

## 2013-01-29 DIAGNOSIS — R5381 Other malaise: Secondary | ICD-10-CM

## 2013-01-29 DIAGNOSIS — I2510 Atherosclerotic heart disease of native coronary artery without angina pectoris: Secondary | ICD-10-CM | POA: Diagnosis not present

## 2013-01-29 MED ORDER — NITROGLYCERIN 0.4 MG SL SUBL: 0.4000 mg | SUBLINGUAL_TABLET | SUBLINGUAL | Status: DC | PRN

## 2013-01-29 MED ORDER — METOPROLOL SUCCINATE ER 100 MG PO TB24: 100.0000 mg | ORAL_TABLET | Freq: Every day | ORAL | Status: DC

## 2013-01-29 NOTE — Patient Instructions (Addendum)
Your physician has requested that you have a carotid duplex. This test is an ultrasound of the carotid arteries in your neck. It looks at blood flow through these arteries that supply the brain with blood. Allow one hour for this exam. There are no restrictions or special instructions.   Your physician has requested that you have a lower or upper extremity arterial duplex. This test is an ultrasound of the arteries in the legs or arms. It looks at arterial blood flow in the legs and arms. Allow one hour for Lower and Upper Arterial scans. There are no restrictions or special instructions  Your physician has requested that you have a lexiscan myoview. For further information please visit [email or web address] Please follow instruction sheet, as given.  These tests will be done in January 2015.   Your physician recommends that you schedule a follow-up appointment in: 6 MONTHS.

## 2013-01-29 NOTE — Progress Notes (Signed)
Patient ID: Gregory Eaton, male   DOB: 04-22-1946, 67 y.o.   MRN: [id number]     HPI: Gregory Eaton, is a 67 y.o. male presents to the office today for six-month cardiology evaluation. Mr. has known coronary artery disease in 1998 he underwent CABG revascularization surgery by Dr. Morton Peters. . In December 2010 he underwent esophageal cancer surgery at [facility]. Additional problems include hypertension as well as hyperlipidemia in addition to peripheral vascular disease. He has documented occluded left vertebral artery with normal antegrade flow the left external carotid and did have mild carotid stenoses are noted on his last Doppler study in detecting April 2013. He also is status post femoropopliteal bypass surgery and has occlusive disease in his right SFA. Last nuclear perfusion study was done in 2013 which continued to show fairly normal perfusion.  Over the past 6 months, he denies recent episodes of chest pain. He denies palpitations. He did not take his medications this morning prior to coming to the office visit. He states his blood pressure when checked at home typically is less than 140 systolically.  Past Medical History  Diagnosis Date  . Esophageal carcinoma 03/2009    T1N1M0  . Hypertension   . Barrett's esophagus   . GERD (gastroesophageal reflux disease)   . CAD (coronary artery disease)   . PVD (peripheral vascular disease)   . Hyperlipidemia   . Adenomatous colon polyp 03/2009    Last colonoscopy by Dr. Jena Gauss   . Diverticulosis   . Tobacco abuse   . Tachyarrhythmia 1999    Status post ablation at [facility]  . COPD (chronic obstructive pulmonary disease)   . History of echocardiogram 08/27/2009    EF >55%  . History of nuclear stress test 11/24/2011    lexiscan; normal perfusion; low risk scan; non-diagnostic for ischemia  . History of Doppler ultrasound 11/09/2011    carotid doppler; L bulb/prox ICA 0-49% diameter reduction; L vertebral artery - occlusive ds; L ECA   demonstrates severe amount of fibrous plaque  . History of Doppler ultrasound 11/09/11    LEAs; R ABI - mod art. insuff.; L ABI normal at rest; R SFA - occlusive ds; L SFA - occlusive ds; patent fem-pop graft    Past Surgical History  Procedure Laterality Date  . Coronary artery bypass graft  1998    [name]  . Femoral-popliteal bypass graft  1993    occlusive ds in R SFA  . Lipoma excision  2010  . Esophagectomy  2010    [facility] Dr. [name]    Allergies  Allergen Reactions  . Prednisone     Current Outpatient Prescriptions  Medication Sig Dispense Refill  . ALPRAZolam (XANAX) 0.5 MG tablet Take 0.5 mg by mouth at bedtime as needed for sleep.      Marland Kitchen amLODipine (NORVASC) 5 MG tablet Take 5 mg by mouth daily.      Marland Kitchen aspirin 81 MG EC tablet Take 81 mg by mouth daily.        . clopidogrel (PLAVIX) 75 MG tablet Take 75 mg by mouth daily.      . CRESTOR 10 MG tablet Take 1 tablet by mouth daily.       . fish oil-omega-3 fatty acids 1000 MG capsule Take 1 g by mouth daily.        Marland Kitchen losartan (COZAAR) 100 MG tablet Take 100 mg by mouth daily.      . metoprolol (LOPRESSOR) 50 MG tablet Take  100 mg by mouth daily.       . Multiple Vitamins-Minerals (CENTRUM SILVER PO) Take 1 tablet by mouth daily.        . nitroGLYCERIN (NITROSTAT) 0.4 MG SL tablet Place 1 tablet (0.4 mg total) under the tongue every 5 (five) minutes as needed for chest pain.  25 tablet  3  . pantoprazole (PROTONIX) 40 MG tablet Take 1 tablet (40 mg total) by mouth daily.  30 tablet  11  . VITAMIN C, CALCIUM ASCORBATE, PO Take 1,000 mg by mouth daily.        . vitamin E 400 UNIT capsule Take 400 Units by mouth daily.        Marland Kitchen latanoprost (XALATAN) 0.005 % ophthalmic solution       . metoprolol succinate (TOPROL-XL) 100 MG 24 hr tablet Take 1 tablet (100 mg total) by mouth daily. Take with or immediately following a meal.  90 tablet  3  . timolol (TIMOPTIC) 0.5 % ophthalmic solution        No current  facility-administered medications for this visit.    Socially he is widowed has 2 children. He quit smoking in 1997.  ROS is negative for fevers, chills or night sweats. He denies presyncope or syncope. He denies palpitations. He denies paresthesias. Denies chest pressure. Denies wheezing. He denies any significant change in claudication symptoms. He has not had blood work done in over a year. He denies bleeding.  Other system review is negative.  PE BP 150/80  Ht 5\' 8"  (1.727 m)  Wt 158 lb 11.2 oz (71.986 kg)  BMI 24.14 kg/m2  General: Alert, oriented, no distress.  Skin: normal turgor, no rashes HEENT: Normocephalic, atraumatic. Pupils round and reactive; sclera anicteric;no lid lag.  Nose without nasal septal hypertrophy Mouth/Parynx benign; Mallinpatti scale 2 Neck: No JVD, no carotid briuts Lungs: clear to ausculatation and percussion; no wheezing or rales Heart: RRR, s1 s2 normal 1/6 systolic murmur Abdomen: soft, nontender; no hepatosplenomehaly, BS+; abdominal aorta nontender and not dilated by palpation. Pulses 2+ with exception of distally which were reduced bilaterally. Extremities: no clubbing cyanosis or edema, Homan's sign negative  Neurologic: grossly nonfocal  ECG: Sinus rhythm at 70 beats per minute. First degree AV block. 1 isolated unifocal PVC.  LABS:  BMET    Component Value Date/Time   NA 139 07/17/2008 1514   K 3.9 07/17/2008 1514   CL 103 07/17/2008 1514   CO2 24 07/17/2008 1514   GLUCOSE 117* 07/17/2008 1514   BUN 14 07/17/2008 1514   CREATININE 0.78 07/17/2008 1514   CALCIUM 9.6 07/17/2008 1514   GFRNONAA >60 07/17/2008 1514   GFRAA  Value: >60        The eGFR has been calculated using the MDRD equation. This calculation has not been validated in all clinical 07/17/2008 1514     Hepatic Function Panel  No results found for this basename: prot, albumin, ast, alt, alkphos, bilitot, bilidir, ibili     CBC    Component Value Date/Time   WBC  8.6 07/17/2008 1514   RBC 5.28 07/17/2008 1514   HGB 15.4 07/17/2008 1514   HCT 46.7 07/17/2008 1514   PLT 312 07/17/2008 1514   MCV 88.3 07/17/2008 1514   MCHC 33.1 07/17/2008 1514   RDW 13.9 07/17/2008 1514     BNP No results found for this basename: probnp    Lipid Panel  No results found for this basename: chol, trig, hdl, cholhdl, vldl, ldlcalc  RADIOLOGY: No results found.    ASSESSMENT AND PLAN: Mr occluded left vertebral artery is also status post femoropopliteal bypass surgery with occlusive disease in his right SFA. I am recommending laboratory be checked in the fasting state. His blood pressure was slightly elevated today but this may be contributed by not taking his metoprolol and losartan. He states he's been taking his metoprolol tartrate 100 mg a day. I am changing this to metoprolol succinate 100 mg daily for its long-acting form of kinetics. I will see him in 6 months for followup evaluation.  Office visit he will undergo a nuclear perfusion study following his bypass surgery, and we will also repeat carotid duplex imaging as well as motion arterial Doppler assessment.Jawara Latorre is 67 year old gentleman who is now 16 years status post CABG surgery. He has a history of esophageal cancer. He has documented occluded left for 2 artery he is status post femoropopliteal bypass surgery with occlusive disease in his right SFA. His last laboratory was 1 year ago. We'll check a complete set of laboratory in early 2015 he will undergo a followup carotid duplex scan, low temperature of Doppler study, and I will schedule him for a two-year followup nuclear perfusion study, 17 years status post CBG surgery. I'll see him in the office in followup and further recommendations will be made at that time.     Lennette Bihari, MD, [facility]  01/29/2013 9:47 AM

## 2013-01-31 DIAGNOSIS — Z79899 Other long term (current) drug therapy: Secondary | ICD-10-CM | POA: Diagnosis not present

## 2013-01-31 DIAGNOSIS — R5381 Other malaise: Secondary | ICD-10-CM | POA: Diagnosis not present

## 2013-01-31 DIAGNOSIS — E782 Mixed hyperlipidemia: Secondary | ICD-10-CM | POA: Diagnosis not present

## 2013-01-31 DIAGNOSIS — I2510 Atherosclerotic heart disease of native coronary artery without angina pectoris: Secondary | ICD-10-CM | POA: Diagnosis not present

## 2013-01-31 DIAGNOSIS — R5383 Other fatigue: Secondary | ICD-10-CM | POA: Diagnosis not present

## 2013-01-31 LAB — LIPID PANEL
Cholesterol: 96 mg/dL (ref 0–200)
Total CHOL/HDL Ratio: 2.6 Ratio
VLDL: 16 mg/dL (ref 0–40)

## 2013-01-31 LAB — COMPREHENSIVE METABOLIC PANEL
AST: 15 U/L (ref 0–37)
Albumin: 4.2 g/dL (ref 3.5–5.2)
Alkaline Phosphatase: 67 U/L (ref 39–117)
BUN: 15 mg/dL (ref 6–23)
Creat: 0.79 mg/dL (ref 0.50–1.35)
Potassium: 4.3 mEq/L (ref 3.5–5.3)

## 2013-01-31 LAB — CBC
HCT: 43.9 % (ref 39.0–52.0)
Hemoglobin: 14.9 g/dL (ref 13.0–17.0)
MCHC: 33.9 g/dL (ref 30.0–36.0)
MCV: 87.6 fL (ref 78.0–100.0)

## 2013-02-06 ENCOUNTER — Encounter: Payer: Self-pay | Admitting: *Deleted

## 2013-02-12 ENCOUNTER — Telehealth: Payer: Self-pay | Admitting: Cardiovascular Disease

## 2013-02-12 NOTE — Telephone Encounter (Signed)
Please call-question about his lab results he received.

## 2013-02-13 NOTE — Telephone Encounter (Signed)
Still waiting to hear from somebody to ask some questions about his lab results.

## 2013-02-16 ENCOUNTER — Telehealth: Payer: Self-pay | Admitting: *Deleted

## 2013-02-16 NOTE — Telephone Encounter (Signed)
Left message returning to answer his questions concerning his labwork.

## 2013-02-19 ENCOUNTER — Telehealth: Payer: Self-pay | Admitting: *Deleted

## 2013-02-19 NOTE — Telephone Encounter (Signed)
Patient called to ask if his trigylcerides and hemoglobin are okay. He received his lab results and did not know how to interpret. Informed him that labs are WNL.

## 2013-03-06 DIAGNOSIS — I10 Essential (primary) hypertension: Secondary | ICD-10-CM | POA: Diagnosis not present

## 2013-03-06 DIAGNOSIS — N400 Enlarged prostate without lower urinary tract symptoms: Secondary | ICD-10-CM | POA: Diagnosis not present

## 2013-03-06 DIAGNOSIS — C155 Malignant neoplasm of lower third of esophagus: Secondary | ICD-10-CM | POA: Diagnosis not present

## 2013-03-06 DIAGNOSIS — Z8501 Personal history of malignant neoplasm of esophagus: Secondary | ICD-10-CM | POA: Diagnosis not present

## 2013-03-06 DIAGNOSIS — Z483 Aftercare following surgery for neoplasm: Secondary | ICD-10-CM | POA: Diagnosis not present

## 2013-03-10 DIAGNOSIS — H6690 Otitis media, unspecified, unspecified ear: Secondary | ICD-10-CM | POA: Diagnosis not present

## 2013-03-10 DIAGNOSIS — J019 Acute sinusitis, unspecified: Secondary | ICD-10-CM | POA: Diagnosis not present

## 2013-03-10 DIAGNOSIS — IMO0002 Reserved for concepts with insufficient information to code with codable children: Secondary | ICD-10-CM | POA: Diagnosis not present

## 2013-03-20 DIAGNOSIS — H4011X0 Primary open-angle glaucoma, stage unspecified: Secondary | ICD-10-CM | POA: Diagnosis not present

## 2013-04-02 DIAGNOSIS — H4011X0 Primary open-angle glaucoma, stage unspecified: Secondary | ICD-10-CM | POA: Diagnosis not present

## 2013-04-06 DIAGNOSIS — Z483 Aftercare following surgery for neoplasm: Secondary | ICD-10-CM | POA: Diagnosis not present

## 2013-04-06 DIAGNOSIS — Z8501 Personal history of malignant neoplasm of esophagus: Secondary | ICD-10-CM | POA: Diagnosis not present

## 2013-04-06 DIAGNOSIS — R918 Other nonspecific abnormal finding of lung field: Secondary | ICD-10-CM | POA: Diagnosis not present

## 2013-04-06 DIAGNOSIS — E041 Nontoxic single thyroid nodule: Secondary | ICD-10-CM | POA: Diagnosis not present

## 2013-04-06 DIAGNOSIS — D379 Neoplasm of uncertain behavior of digestive organ, unspecified: Secondary | ICD-10-CM | POA: Diagnosis not present

## 2013-04-06 DIAGNOSIS — C155 Malignant neoplasm of lower third of esophagus: Secondary | ICD-10-CM | POA: Diagnosis not present

## 2013-04-07 ENCOUNTER — Other Ambulatory Visit: Payer: Self-pay | Admitting: Cardiovascular Disease

## 2013-04-09 NOTE — Telephone Encounter (Signed)
Rx was sent to pharmacy electronically. 

## 2013-05-11 DIAGNOSIS — Z23 Encounter for immunization: Secondary | ICD-10-CM | POA: Diagnosis not present

## 2013-06-04 DIAGNOSIS — K219 Gastro-esophageal reflux disease without esophagitis: Secondary | ICD-10-CM | POA: Diagnosis not present

## 2013-06-04 DIAGNOSIS — F411 Generalized anxiety disorder: Secondary | ICD-10-CM | POA: Diagnosis not present

## 2013-06-04 DIAGNOSIS — Z6830 Body mass index (BMI) 30.0-30.9, adult: Secondary | ICD-10-CM | POA: Diagnosis not present

## 2013-07-02 ENCOUNTER — Encounter (HOSPITAL_COMMUNITY): Payer: Self-pay | Admitting: *Deleted

## 2013-07-10 ENCOUNTER — Ambulatory Visit (HOSPITAL_COMMUNITY)
Admission: RE | Admit: 2013-07-10 | Discharge: 2013-07-10 | Disposition: A | Discharge status: Home / Self Care | Payer: Medicare Other | Source: Ambulatory Visit | Attending: Cardiovascular Disease | Admitting: Cardiovascular Disease

## 2013-07-10 DIAGNOSIS — I70209 Unspecified atherosclerosis of native arteries of extremities, unspecified extremity: Secondary | ICD-10-CM | POA: Insufficient documentation

## 2013-07-10 DIAGNOSIS — I739 Peripheral vascular disease, unspecified: Secondary | ICD-10-CM

## 2013-07-10 NOTE — Progress Notes (Signed)
Lower Extremity Arterial Duplex Completed. °Brianna L Mazza,RVT °

## 2013-07-16 ENCOUNTER — Ambulatory Visit (HOSPITAL_COMMUNITY)
Admission: RE | Admit: 2013-07-16 | Discharge: 2013-07-16 | Disposition: A | Discharge status: Home / Self Care | Payer: Medicare Other | Source: Ambulatory Visit | Attending: Cardiovascular Disease | Admitting: Cardiovascular Disease

## 2013-07-16 DIAGNOSIS — I6529 Occlusion and stenosis of unspecified carotid artery: Secondary | ICD-10-CM | POA: Diagnosis not present

## 2013-07-16 DIAGNOSIS — I672 Cerebral atherosclerosis: Secondary | ICD-10-CM | POA: Diagnosis not present

## 2013-07-16 DIAGNOSIS — R0989 Other specified symptoms and signs involving the circulatory and respiratory systems: Secondary | ICD-10-CM | POA: Diagnosis not present

## 2013-07-16 NOTE — Progress Notes (Signed)
Carotid Duplex Completed. Teandra Harlan, BS, RDMS, RVT  

## 2013-07-24 ENCOUNTER — Ambulatory Visit (HOSPITAL_COMMUNITY)
Admission: RE | Admit: 2013-07-24 | Discharge: 2013-07-24 | Disposition: A | Discharge status: Home / Self Care | Payer: Medicare Other | Source: Ambulatory Visit | Attending: Cardiovascular Disease | Admitting: Cardiovascular Disease

## 2013-07-24 DIAGNOSIS — I2510 Atherosclerotic heart disease of native coronary artery without angina pectoris: Secondary | ICD-10-CM | POA: Diagnosis not present

## 2013-07-24 DIAGNOSIS — E782 Mixed hyperlipidemia: Secondary | ICD-10-CM | POA: Insufficient documentation

## 2013-07-24 MED ORDER — TECHNETIUM TC 99M SESTAMIBI GENERIC - CARDIOLITE
31.0000 | Freq: Once | INTRAVENOUS | Status: AC | PRN
  Administered 2013-07-24: 31 via INTRAVENOUS

## 2013-07-24 MED ORDER — TECHNETIUM TC 99M SESTAMIBI GENERIC - CARDIOLITE
10.3000 | Freq: Once | INTRAVENOUS | Status: AC | PRN
  Administered 2013-07-24: 10 via INTRAVENOUS

## 2013-07-24 MED ORDER — REGADENOSON 0.4 MG/5ML IV SOLN
0.4000 mg | Freq: Once | INTRAVENOUS | Status: AC
  Administered 2013-07-24: 0.4 mg via INTRAVENOUS

## 2013-07-24 NOTE — Procedures (Addendum)
[place] [address] [place] [address] [phone]  Cardiology Nuclear Med Study  Aristotle Lieb Wuertz is a 68 y.o. male     MRN : [id number]     DOB: Jun 21, 1946  Procedure Date: 07/24/2013  Nuclear Med Background Indication for Stress Test:  Graft Patency History:  COPD and CAD;CABG X5--1998 Cardiac Risk Factors: Carotid Disease, Family History - CAD, History of Smoking, Hypertension, Lipids and PVD  Symptoms:  DOE and SOB   Nuclear Pre-Procedure Caffeine/Decaff Intake:  7:00pm NPO After: 5:00am   IV Site: R Hand  IV 0.9% NS with Angio Cath:  22g  Chest Size (in):  40"  IV Started by: Azucena Cecil, RN  Height: 5\' 8"  (1.727 m)  Cup Size: n/a  BMI:  Body mass index is 24.03 kg/(m^2). Weight:  158 lb (71.668 kg)   Tech Comments:  N/A    Nuclear Med Study 1 or 2 day study: 1 day  Stress Test Type:  [place]  Order Authorizing Provider:  Shelva Majestic, MD   Resting Radionuclide: Technetium 73m Sestamibi  Resting Radionuclide Dose: 10.3 mCi   Stress Radionuclide:  Technetium 70m Sestamibi  Stress Radionuclide Dose: 31.0 mCi           Stress Protocol Rest HR: 67 Stress HR: 77  Rest BP: 193/92 Stress BP: 198/83  Exercise Time (min): n/a METS: n/a   Predicted Max HR: 152 bpm % Max HR: 56.58 bpm Rate Pressure Product: 17028  Dose of Adenosine (mg):  n/a Dose of Lexiscan: 0.4 mg  Dose of Atropine (mg): n/a Dose of Dobutamine: n/a mcg/kg/min (at max HR)  Stress Test Technologist: Leane Para, CCT Nuclear Technologist: Imagene Riches, CNMT   Rest Procedure:  Myocardial perfusion imaging was performed at rest 45 minutes following the intravenous administration of Technetium 69m Sestamibi. Stress Procedure:  The patient received IV Lexiscan 0.4 mg over 15-seconds.  Technetium 29m Sestamibi injected at 30-seconds.  There were no significant changes with Lexiscan.  Quantitative spect images were obtained after a 45  minute delay.  Transient Ischemic Dilatation (Normal <1.22):  0.98 Lung/Heart Ratio (Normal <0.45):  0.28 QGS EDV:  70 ml QGS ESV:  26 ml LV Ejection Fraction: 62%     Rest ECG: NSR - Normal EKG  Stress ECG: No significant change from baseline ECG  QPS Raw Data Images:  Normal; no motion artifact; normal heart/lung ratio. Stress Images:  Normal homogeneous uptake in all areas of the myocardium. Rest Images:  Normal homogeneous uptake in all areas of the myocardium. Subtraction (SDS):  No evidence of ischemia. LV Wall Motion:  NL LV Function; NL Wall Motion  Impression Exercise Capacity:  Lexiscan with no exercise. BP Response:  Normal blood pressure response. Clinical Symptoms:  No significant symptoms noted. ECG Impression:  No significant ST segment change suggestive of ischemia. Comparison with Prior Nuclear Study: No significant change from previous study   Overall Impression:  Normal stress nuclear study.   Sanda Klein, MD  07/24/2013 12:55 PM

## 2013-08-13 ENCOUNTER — Ambulatory Visit (INDEPENDENT_AMBULATORY_CARE_PROVIDER_SITE_OTHER): Payer: Medicare Other | Admitting: Cardiovascular Disease

## 2013-08-13 ENCOUNTER — Encounter: Payer: Self-pay | Admitting: Cardiovascular Disease

## 2013-08-13 VITALS — BP 116/60 | HR 86 | Ht 68.0 in | Wt 164.1 lb

## 2013-08-13 DIAGNOSIS — I2510 Atherosclerotic heart disease of native coronary artery without angina pectoris: Secondary | ICD-10-CM | POA: Diagnosis not present

## 2013-08-13 DIAGNOSIS — E785 Hyperlipidemia, unspecified: Secondary | ICD-10-CM | POA: Diagnosis not present

## 2013-08-13 DIAGNOSIS — K219 Gastro-esophageal reflux disease without esophagitis: Secondary | ICD-10-CM

## 2013-08-13 DIAGNOSIS — I739 Peripheral vascular disease, unspecified: Secondary | ICD-10-CM

## 2013-08-13 DIAGNOSIS — R0989 Other specified symptoms and signs involving the circulatory and respiratory systems: Secondary | ICD-10-CM

## 2013-08-13 DIAGNOSIS — K2270 Barrett's esophagus without dysplasia: Secondary | ICD-10-CM

## 2013-08-13 DIAGNOSIS — I10 Essential (primary) hypertension: Secondary | ICD-10-CM

## 2013-08-13 MED ORDER — METOPROLOL SUCCINATE ER 100 MG PO TB24: 100.0000 mg | ORAL_TABLET | Freq: Every day | ORAL | Status: DC

## 2013-08-13 MED ORDER — ROSUVASTATIN CALCIUM 10 MG PO TABS: 10.0000 mg | ORAL_TABLET | Freq: Every day | ORAL | Status: DC

## 2013-08-13 MED ORDER — LOSARTAN POTASSIUM 100 MG PO TABS: 100.0000 mg | ORAL_TABLET | Freq: Every day | ORAL | Status: DC

## 2013-08-13 NOTE — Patient Instructions (Signed)
Your physician recommends that you schedule a follow-up appointment in: 6 Months  

## 2013-08-13 NOTE — Progress Notes (Signed)
Patient ID: Gregory Eaton, male   DOB: 19-Jun-1946, 68 y.o.   MRN: [id number]      HPI: Gregory Eaton, is a 68 y.o. male presents to the office today for six-month cardiology evaluation.  Gregory Eaton has known coronary artery disease  underwent CABG revascularization surgery by Dr. Nils Eaton in 1998. In December 2010 he underwent esophageal cancer surgery at [facility]. Additional problems include hypertension as well as hyperlipidemia in addition to peripheral vascular disease. He has documented occluded left vertebral artery with normal antegrade flow the left external carotid and did have mild carotid stenoses are noted on his last Doppler study in detecting April 2013. He also is status post femoropopliteal bypass surgery and has occlusive disease in his right SFA. Last nuclear perfusion study was done in 2013 which continued to show fairly normal perfusion.  As I last saw him, he underwent a followup nuclear perfusion study which was done on 07/24/2013. This continued to reveal normal perfusion without scar or ischemia, 17 years after his CABG revascularization surgery. He had also undergone followup carotid studies which again showed an occluded left vertebral artery and mild disease in his right and left internal carotid system with narrowings less than 49%. He also is status post femoropopliteal bypass surgery. His lungs have a Doppler study from 07/10/2013 showed ABIs of 1.4 bilaterally at his ankles. He did have occlusive disease with reconstitution above the knee and the right SFA. His ABI values were now noncompressible.  Gregory Eaton denies recent chest pain. He denies significant claudication symptoms. He denies dizziness. He is bothered by esophageal reflux. He status post prior surgery for esophageal cancer and states his stomach is in his thoracic cavity.  Past Medical History  Diagnosis Date  . Esophageal carcinoma 03/2009    T1N1M0  . Hypertension   . Barrett's esophagus   . GERD  (gastroesophageal reflux disease)   . CAD (coronary artery disease)   . PVD (peripheral vascular disease)   . Hyperlipidemia   . Adenomatous colon polyp 03/2009    Last colonoscopy by Dr. Gala Eaton   . Diverticulosis   . Tobacco abuse   . Tachyarrhythmia 1999    Status post ablation at [facility]  . COPD (chronic obstructive pulmonary disease)   . History of echocardiogram 08/27/2009    EF >55%  . History of nuclear stress test 11/24/2011    lexiscan; normal perfusion; low risk scan; non-diagnostic for ischemia  . History of Doppler ultrasound 11/09/2011    carotid doppler; L bulb/prox ICA 0-49% diameter reduction; L vertebral artery - occlusive ds; L ECA  demonstrates severe amount of fibrous plaque  . History of Doppler ultrasound 11/09/11    LEAs; R ABI - mod art. insuff.; L ABI normal at rest; R SFA - occlusive ds; L SFA - occlusive ds; patent fem-pop graft    Past Surgical History  Procedure Laterality Date  . Coronary artery bypass graft  1998    Gregory Eaton  . Femoral-popliteal bypass graft  1993    occlusive ds in R SFA  . Lipoma excision  2010  . Esophagectomy  2010    Gregory Eaton    Allergies  Allergen Reactions  . Prednisone     Current Outpatient Prescriptions  Medication Sig Dispense Refill  . ALPRAZolam (XANAX) 0.5 MG tablet Take 0.5 mg by mouth at bedtime as needed for sleep.      Marland Kitchen amLODipine (NORVASC) 5 MG tablet Take 5 mg by mouth  daily.      . aspirin 81 MG EC tablet Take 81 mg by mouth daily.        . clopidogrel (PLAVIX) 75 MG tablet TAKE ONE (1) TABLET EACH DAY  30 tablet  5  . CRESTOR 10 MG tablet Take 1 tablet by mouth daily.       Marland Kitchen latanoprost (XALATAN) 0.005 % ophthalmic solution       . metoprolol succinate (TOPROL-XL) 100 MG 24 hr tablet Take 1 tablet (100 mg total) by mouth daily. Take with or immediately following a meal.  90 tablet  3  . Multiple Vitamins-Minerals (CENTRUM SILVER PO) Take 1 tablet by mouth daily.        . nitroGLYCERIN  (NITROSTAT) 0.4 MG SL tablet Place 1 tablet (0.4 mg total) under the tongue every 5 (five) minutes as needed for chest pain.  25 tablet  3  . pantoprazole (PROTONIX) 40 MG tablet Take 40 mg by mouth 2 (two) times daily.      . timolol (TIMOPTIC) 0.5 % ophthalmic solution       . vitamin E 400 UNIT capsule Take 400 Units by mouth daily.        . fish oil-omega-3 fatty acids 1000 MG capsule Take 1 g by mouth daily.        Marland Kitchen losartan (COZAAR) 100 MG tablet Take 1 tablet (100 mg total) by mouth daily.  90 tablet  3  . VITAMIN C, CALCIUM ASCORBATE, PO Take 1,000 mg by mouth daily.         No current facility-administered medications for this visit.    Socially he is widowed has 2 children. He quit smoking in 1997.  ROS is negative for fevers, chills or night sweats. He denies change in vision or hearing. He denies cough. He denies wheezing. No sputum production presumably lymphadenopathy. He denies presyncope or syncope. He denies palpitations. He denies paresthesias. Denies chest pressure. He does have esophageal reflux. He denies awareness of blood in stool or urine. There is no nausea vomiting or diarrhea. He denies any significant change in claudication symptoms.  He denies bleeding.  There is no diabetes.  He is unaware of thyroid abnormalities. Other comprehensive 14 point system review is negative.  PE BP 116/60  Pulse 86  Ht _0  (1.727 m)  Wt 164 lb 1.6 oz (74.435 kg)  BMI 24.96 kg/m2  General: Alert, oriented, no distress.  Skin: normal turgor, no rashes HEENT: Normocephalic, atraumatic. Pupils round and reactive; sclera anicteric;no lid lag.  Nose without nasal septal hypertrophy Mouth/Parynx benign; Mallinpatti scale 2 Neck: No JVD, soft carotid briuts; normal carotid upstrokes. Lungs: clear to ausculatation and percussion; no wheezing or rales Chest wall: Nontender to palpation Heart: RRR, s1 s2 normal 1/6 systolic murmur Back: No CVA tenderness. Abdomen: soft, nontender; no  hepatosplenomehaly, BS+; abdominal aorta nontender and not dilated by palpation. Pulses 2+ with exception of distally which were reduced bilaterally. Extremities: no clubbing cyanosis or edema, Homan's sign negative  Neurologic: grossly nonfocal Psychological: Normal affect and mood.   ECG (independently read by me): Sinus rhythm 86 beats per minute. PR interval 200 ms.  Prior ECG of July 2014 :Sinus rhythm at 70 beats per minute. First degree AV block. 1 isolated unifocal PVC.  LABS:  BMET    Component Value Date/Time   NA 142 01/31/2013 0940   K 4.3 01/31/2013 0940   CL 107 01/31/2013 0940   CO2 25 01/31/2013 0940   GLUCOSE 89  01/31/2013 0940   BUN 15 01/31/2013 0940   CREATININE 0.79 01/31/2013 0940   CREATININE 0.78 07/17/2008 1514   CALCIUM 9.2 01/31/2013 0940   GFRNONAA >60 07/17/2008 1514   GFRAA  Value: >60        The eGFR has been calculated using the MDRD equation. This calculation has not been validated in all clinical 07/17/2008 1514     Hepatic Function Panel     Component Value Date/Time   PROT 6.3 01/31/2013 0940     CBC    Component Value Date/Time   WBC 5.8 01/31/2013 0940   RBC 5.01 01/31/2013 0940   HGB 14.9 01/31/2013 0940   HCT 43.9 01/31/2013 0940   PLT 273 01/31/2013 0940   MCV 87.6 01/31/2013 0940   MCH 29.7 01/31/2013 0940   MCHC 33.9 01/31/2013 0940   RDW 14.2 01/31/2013 0940     BNP No results found for this basename: probnp    Lipid Panel     Component Value Date/Time   CHOL 96 01/31/2013 0940     RADIOLOGY: No results found.    ASSESSMENT AND PLAN: Gregory Eaton is now almost 17 years following CABG revascularization surgery. His most recent nuclear perfusion study continues to suggest patency of his grafts and this was without scar or ischemia. He is not having anginal symptoms on current therapy. With reference to his peripheral last disease, his carotid Doppler study is unchanged from previously and still shows occlusive disease in the  left vertebral artery with normal antegrade flow into left external carotid and mild stenoses bilaterally. He has occlusive disease in his right SFA but apparently continues to do well, status post femoropopliteal bypass surgery with ABIs at 1.4 which now are noncompressible. Gregory Eaton had recently run out of his metoprolol succinate which was 100 mg. He has been taking an older prescription of metoprolol tartrate at 100 mg but has only been taking this daily. This explains his pulse increased to the upper 80's. I am renewing his prescription for metoprolol succinate 100 mg daily. He also has run out of his losartan 100 mg I'm renewing this. He'll currently take his amlodipine 5 mg continued blood pressure control as well. With reference to his hyperlipidemia, he is on Crestor 40 mg. Laboratory done this past Summer had shown an LDL cholesterol of 43 total cholesterol 96 on current therapy. He continues to have esophageal reflux symptoms and is taking protonic. There is a history of a Barrett's esophagus and he is status post development of esophageal cancer with surgery done in December 2000 at [facility]. He has  Follow-up with them at 6 month intervals. He is taking aspirin for antiplatelet therapy. I'll see him back in the office in 6 months for followup evaluation.  Troy Sine, MD, [facility]  08/13/2013 8:48 AM

## 2013-08-14 NOTE — Progress Notes (Signed)
Discussed @ appointment.

## 2013-08-14 NOTE — Progress Notes (Signed)
Discussed @ office appointment.

## 2013-09-11 DIAGNOSIS — C155 Malignant neoplasm of lower third of esophagus: Secondary | ICD-10-CM | POA: Diagnosis not present

## 2013-09-11 DIAGNOSIS — R918 Other nonspecific abnormal finding of lung field: Secondary | ICD-10-CM | POA: Diagnosis not present

## 2013-09-11 DIAGNOSIS — C159 Malignant neoplasm of esophagus, unspecified: Secondary | ICD-10-CM | POA: Diagnosis not present

## 2013-09-28 DIAGNOSIS — Z1289 Encounter for screening for malignant neoplasm of other sites: Secondary | ICD-10-CM | POA: Diagnosis not present

## 2013-09-28 DIAGNOSIS — J438 Other emphysema: Secondary | ICD-10-CM | POA: Diagnosis not present

## 2013-09-28 DIAGNOSIS — R918 Other nonspecific abnormal finding of lung field: Secondary | ICD-10-CM | POA: Diagnosis not present

## 2013-09-28 DIAGNOSIS — C155 Malignant neoplasm of lower third of esophagus: Secondary | ICD-10-CM | POA: Diagnosis not present

## 2013-10-29 DIAGNOSIS — M5137 Other intervertebral disc degeneration, lumbosacral region: Secondary | ICD-10-CM | POA: Diagnosis not present

## 2013-10-29 DIAGNOSIS — M47817 Spondylosis without myelopathy or radiculopathy, lumbosacral region: Secondary | ICD-10-CM | POA: Diagnosis not present

## 2013-11-02 DIAGNOSIS — M5137 Other intervertebral disc degeneration, lumbosacral region: Secondary | ICD-10-CM | POA: Diagnosis not present

## 2013-11-02 DIAGNOSIS — M47817 Spondylosis without myelopathy or radiculopathy, lumbosacral region: Secondary | ICD-10-CM | POA: Diagnosis not present

## 2013-11-09 DIAGNOSIS — M47817 Spondylosis without myelopathy or radiculopathy, lumbosacral region: Secondary | ICD-10-CM | POA: Diagnosis not present

## 2013-11-09 DIAGNOSIS — M999 Biomechanical lesion, unspecified: Secondary | ICD-10-CM | POA: Diagnosis not present

## 2013-11-09 DIAGNOSIS — M5137 Other intervertebral disc degeneration, lumbosacral region: Secondary | ICD-10-CM | POA: Diagnosis not present

## 2013-11-09 DIAGNOSIS — M5430 Sciatica, unspecified side: Secondary | ICD-10-CM | POA: Diagnosis not present

## 2013-11-13 DIAGNOSIS — M5430 Sciatica, unspecified side: Secondary | ICD-10-CM | POA: Diagnosis not present

## 2013-11-13 DIAGNOSIS — M47817 Spondylosis without myelopathy or radiculopathy, lumbosacral region: Secondary | ICD-10-CM | POA: Diagnosis not present

## 2013-11-13 DIAGNOSIS — M999 Biomechanical lesion, unspecified: Secondary | ICD-10-CM | POA: Diagnosis not present

## 2013-11-15 DIAGNOSIS — M47817 Spondylosis without myelopathy or radiculopathy, lumbosacral region: Secondary | ICD-10-CM | POA: Diagnosis not present

## 2013-11-15 DIAGNOSIS — M5430 Sciatica, unspecified side: Secondary | ICD-10-CM | POA: Diagnosis not present

## 2013-11-15 DIAGNOSIS — M999 Biomechanical lesion, unspecified: Secondary | ICD-10-CM | POA: Diagnosis not present

## 2013-11-22 DIAGNOSIS — M47817 Spondylosis without myelopathy or radiculopathy, lumbosacral region: Secondary | ICD-10-CM | POA: Diagnosis not present

## 2013-11-22 DIAGNOSIS — M5430 Sciatica, unspecified side: Secondary | ICD-10-CM | POA: Diagnosis not present

## 2013-11-22 DIAGNOSIS — M999 Biomechanical lesion, unspecified: Secondary | ICD-10-CM | POA: Diagnosis not present

## 2013-12-31 DIAGNOSIS — M47817 Spondylosis without myelopathy or radiculopathy, lumbosacral region: Secondary | ICD-10-CM | POA: Diagnosis not present

## 2013-12-31 DIAGNOSIS — M5430 Sciatica, unspecified side: Secondary | ICD-10-CM | POA: Diagnosis not present

## 2013-12-31 DIAGNOSIS — M999 Biomechanical lesion, unspecified: Secondary | ICD-10-CM | POA: Diagnosis not present

## 2014-01-01 ENCOUNTER — Other Ambulatory Visit: Payer: Self-pay

## 2014-01-01 MED ORDER — AMLODIPINE BESYLATE 5 MG PO TABS: 5.0000 mg | ORAL_TABLET | Freq: Every day | ORAL | Status: DC

## 2014-01-01 NOTE — Telephone Encounter (Signed)
Rx was sent to pharmacy electronically. 

## 2014-01-03 DIAGNOSIS — M47817 Spondylosis without myelopathy or radiculopathy, lumbosacral region: Secondary | ICD-10-CM | POA: Diagnosis not present

## 2014-01-03 DIAGNOSIS — M999 Biomechanical lesion, unspecified: Secondary | ICD-10-CM | POA: Diagnosis not present

## 2014-01-03 DIAGNOSIS — M5430 Sciatica, unspecified side: Secondary | ICD-10-CM | POA: Diagnosis not present

## 2014-01-08 DIAGNOSIS — M5430 Sciatica, unspecified side: Secondary | ICD-10-CM | POA: Diagnosis not present

## 2014-01-08 DIAGNOSIS — M47817 Spondylosis without myelopathy or radiculopathy, lumbosacral region: Secondary | ICD-10-CM | POA: Diagnosis not present

## 2014-01-08 DIAGNOSIS — M999 Biomechanical lesion, unspecified: Secondary | ICD-10-CM | POA: Diagnosis not present

## 2014-02-05 DIAGNOSIS — H60399 Other infective otitis externa, unspecified ear: Secondary | ICD-10-CM | POA: Diagnosis not present

## 2014-02-27 DIAGNOSIS — I2510 Atherosclerotic heart disease of native coronary artery without angina pectoris: Secondary | ICD-10-CM | POA: Diagnosis not present

## 2014-02-27 DIAGNOSIS — IMO0002 Reserved for concepts with insufficient information to code with codable children: Secondary | ICD-10-CM | POA: Diagnosis not present

## 2014-02-27 DIAGNOSIS — Z79899 Other long term (current) drug therapy: Secondary | ICD-10-CM | POA: Diagnosis not present

## 2014-02-27 DIAGNOSIS — Z23 Encounter for immunization: Secondary | ICD-10-CM | POA: Diagnosis not present

## 2014-02-27 DIAGNOSIS — F411 Generalized anxiety disorder: Secondary | ICD-10-CM | POA: Diagnosis not present

## 2014-02-27 DIAGNOSIS — Z125 Encounter for screening for malignant neoplasm of prostate: Secondary | ICD-10-CM | POA: Diagnosis not present

## 2014-02-27 DIAGNOSIS — G8929 Other chronic pain: Secondary | ICD-10-CM | POA: Diagnosis not present

## 2014-02-28 DIAGNOSIS — F411 Generalized anxiety disorder: Secondary | ICD-10-CM | POA: Diagnosis not present

## 2014-02-28 DIAGNOSIS — G8929 Other chronic pain: Secondary | ICD-10-CM | POA: Diagnosis not present

## 2014-02-28 DIAGNOSIS — I2510 Atherosclerotic heart disease of native coronary artery without angina pectoris: Secondary | ICD-10-CM | POA: Diagnosis not present

## 2014-02-28 DIAGNOSIS — Z79899 Other long term (current) drug therapy: Secondary | ICD-10-CM | POA: Diagnosis not present

## 2014-02-28 DIAGNOSIS — Z125 Encounter for screening for malignant neoplasm of prostate: Secondary | ICD-10-CM | POA: Diagnosis not present

## 2014-02-28 DIAGNOSIS — Z23 Encounter for immunization: Secondary | ICD-10-CM | POA: Diagnosis not present

## 2014-03-07 ENCOUNTER — Ambulatory Visit (INDEPENDENT_AMBULATORY_CARE_PROVIDER_SITE_OTHER): Payer: Medicare Other | Admitting: Otolaryngology

## 2014-03-07 DIAGNOSIS — R040 Epistaxis: Secondary | ICD-10-CM

## 2014-03-15 ENCOUNTER — Emergency Department (HOSPITAL_COMMUNITY): Payer: Medicare Other

## 2014-03-15 ENCOUNTER — Emergency Department (HOSPITAL_COMMUNITY)
Admission: EM | Admit: 2014-03-15 | Discharge: 2014-03-15 | Disposition: A | Discharge status: Home / Self Care | Payer: Medicare Other | Attending: Emergency Medicine | Admitting: Emergency Medicine

## 2014-03-15 ENCOUNTER — Encounter (HOSPITAL_COMMUNITY): Payer: Self-pay | Admitting: Emergency Medicine

## 2014-03-15 DIAGNOSIS — Z8719 Personal history of other diseases of the digestive system: Secondary | ICD-10-CM

## 2014-03-15 DIAGNOSIS — J449 Chronic obstructive pulmonary disease, unspecified: Secondary | ICD-10-CM | POA: Diagnosis not present

## 2014-03-15 DIAGNOSIS — Z8501 Personal history of malignant neoplasm of esophagus: Secondary | ICD-10-CM | POA: Insufficient documentation

## 2014-03-15 DIAGNOSIS — I2510 Atherosclerotic heart disease of native coronary artery without angina pectoris: Secondary | ICD-10-CM | POA: Diagnosis not present

## 2014-03-15 DIAGNOSIS — E785 Hyperlipidemia, unspecified: Secondary | ICD-10-CM | POA: Insufficient documentation

## 2014-03-15 DIAGNOSIS — R55 Syncope and collapse: Secondary | ICD-10-CM | POA: Diagnosis not present

## 2014-03-15 DIAGNOSIS — Z860100 Personal history of colon polyps, unspecified: Secondary | ICD-10-CM | POA: Insufficient documentation

## 2014-03-15 DIAGNOSIS — R110 Nausea: Secondary | ICD-10-CM | POA: Insufficient documentation

## 2014-03-15 DIAGNOSIS — Z87891 Personal history of nicotine dependence: Secondary | ICD-10-CM | POA: Insufficient documentation

## 2014-03-15 DIAGNOSIS — Z79899 Other long term (current) drug therapy: Secondary | ICD-10-CM | POA: Insufficient documentation

## 2014-03-15 DIAGNOSIS — L508 Other urticaria: Secondary | ICD-10-CM | POA: Diagnosis not present

## 2014-03-15 DIAGNOSIS — K2270 Barrett's esophagus without dysplasia: Secondary | ICD-10-CM | POA: Insufficient documentation

## 2014-03-15 DIAGNOSIS — F17200 Nicotine dependence, unspecified, uncomplicated: Secondary | ICD-10-CM | POA: Insufficient documentation

## 2014-03-15 DIAGNOSIS — R079 Chest pain, unspecified: Secondary | ICD-10-CM | POA: Diagnosis not present

## 2014-03-15 DIAGNOSIS — R0789 Other chest pain: Secondary | ICD-10-CM | POA: Diagnosis not present

## 2014-03-15 DIAGNOSIS — I10 Essential (primary) hypertension: Secondary | ICD-10-CM | POA: Insufficient documentation

## 2014-03-15 DIAGNOSIS — K219 Gastro-esophageal reflux disease without esophagitis: Secondary | ICD-10-CM | POA: Insufficient documentation

## 2014-03-15 DIAGNOSIS — J4489 Other specified chronic obstructive pulmonary disease: Secondary | ICD-10-CM | POA: Diagnosis not present

## 2014-03-15 DIAGNOSIS — Z7982 Long term (current) use of aspirin: Secondary | ICD-10-CM | POA: Diagnosis not present

## 2014-03-15 LAB — CBC WITH DIFFERENTIAL/PLATELET
Basophils Absolute: 0 10*3/uL (ref 0.0–0.1)
Basophils Relative: 0 % (ref 0–1)
Eosinophils Absolute: 0 10*3/uL (ref 0.0–0.7)
Eosinophils Relative: 0 % (ref 0–5)
HEMATOCRIT: 41.1 % (ref 39.0–52.0)
Hemoglobin: 14.3 g/dL (ref 13.0–17.0)
LYMPHS ABS: 1.1 10*3/uL (ref 0.7–4.0)
LYMPHS PCT: 9 % — AB (ref 12–46)
MCH: 31 pg (ref 26.0–34.0)
MCHC: 34.8 g/dL (ref 30.0–36.0)
MCV: 89 fL (ref 78.0–100.0)
MONOS PCT: 8 % (ref 3–12)
Monocytes Absolute: 1 10*3/uL (ref 0.1–1.0)
NEUTROS ABS: 9.9 10*3/uL — AB (ref 1.7–7.7)
NEUTROS PCT: 83 % — AB (ref 43–77)
Platelets: 247 10*3/uL (ref 150–400)
RBC: 4.62 MIL/uL (ref 4.22–5.81)
RDW: 12.9 % (ref 11.5–15.5)
WBC: 12.1 10*3/uL — AB (ref 4.0–10.5)

## 2014-03-15 LAB — COMPREHENSIVE METABOLIC PANEL
ALK PHOS: 77 U/L (ref 39–117)
ALT: 15 U/L (ref 0–53)
AST: 15 U/L (ref 0–37)
Albumin: 3.7 g/dL (ref 3.5–5.2)
Anion gap: 11 (ref 5–15)
BILIRUBIN TOTAL: 0.5 mg/dL (ref 0.3–1.2)
BUN: 17 mg/dL (ref 6–23)
CHLORIDE: 103 meq/L (ref 96–112)
CO2: 26 mEq/L (ref 19–32)
Calcium: 8.9 mg/dL (ref 8.4–10.5)
Creatinine, Ser: 1.07 mg/dL (ref 0.50–1.35)
GFR calc non Af Amer: 69 mL/min — ABNORMAL LOW (ref >90)
GFR, EST AFRICAN AMERICAN: 80 mL/min — AB (ref >90)
GLUCOSE: 111 mg/dL — AB (ref 70–99)
POTASSIUM: 4.6 meq/L (ref 3.7–5.3)
Sodium: 140 mEq/L (ref 137–147)
Total Protein: 6.9 g/dL (ref 6.0–8.3)

## 2014-03-15 LAB — LIPASE, BLOOD: Lipase: 23 U/L (ref 11–59)

## 2014-03-15 LAB — TROPONIN I
Troponin I: <0.3 ng/mL (ref <0.30)
Troponin I: <0.3 ng/mL (ref <0.30)

## 2014-03-15 MED ORDER — SUCRALFATE 1 G PO TABS: 1.0000 g | ORAL_TABLET | Freq: Three times a day (TID) | ORAL | Status: DC

## 2014-03-15 NOTE — ED Notes (Signed)
Patient verbalizes understanding of discharge instructions, prescription medications, and follow up care with PCP. Patient ambulatory out of department at this time with family.

## 2014-03-15 NOTE — ED Notes (Signed)
Portable chest xray at bedside.

## 2014-03-15 NOTE — ED Provider Notes (Signed)
CSN: [id number]     Arrival date & time 03/15/14  1557 History   First MD Initiated Contact with Patient 03/15/14 1610    This chart was scribed for Maudry Diego, MD by Terressa Koyanagi, ED Scribe. This patient was seen in room APA17/AP[facility]17 and the patient's care was started at 4:14 PM.  Chief Complaint  Patient presents with  . Chest Pain   Patient is a 68 y.o. male presenting with chest pain. The history is provided by the patient. No language interpreter was used.  Chest Pain Pain quality: pressure   Pain radiates to:  Does not radiate Pain radiates to the back: no   Pain severity:  Mild (5 out of 10) Onset quality:  Sudden Timing:  Intermittent Context: at rest   Associated symptoms: diaphoresis, nausea and near-syncope   Associated symptoms: no abdominal pain, no back pain, no cough, no fatigue and no headache    HPI Comments: PCP: Leonides Grills, MD  Gregory Eaton is a 68 y.o. male, with an extensive medical Hx noted below and significant for esophageal carcinoma, HTN, CAD, HLD, who presents to the Emergency Department complaining of intermittent chest pain with associated diaphoresis, SOB, near syncope, and lightheadedness onset earlier today while pt was at rest, sitting in his vehicle. Pt describes his chest pain as a feeling of pressure and rates it a 5 out of 10. Pt reports a Hx of similar Sx and states he followed up with a cardiologist for the same this past January. Pt further reports that he had a stress test with normal results this past January.   Pt is a former smoker with a quit date of 07/20/91.   Past Medical History  Diagnosis Date  . Esophageal carcinoma 03/2009    [id number]  . Hypertension   . Barrett's esophagus   . GERD (gastroesophageal reflux disease)   . CAD (coronary artery disease)   . PVD (peripheral vascular disease)   . Hyperlipidemia   . Adenomatous colon polyp 03/2009    Last colonoscopy by Dr. [name]   . Diverticulosis   . Tobacco abuse   .  Tachyarrhythmia 1999    Status post ablation at [facility]  . COPD (chronic obstructive pulmonary disease)   . History of echocardiogram 08/27/2009    EF >55%  . History of nuclear stress test 11/24/2011    lexiscan; normal perfusion; low risk scan; non-diagnostic for ischemia  . History of Doppler ultrasound 11/09/2011    carotid doppler; L bulb/prox ICA 0-49% diameter reduction; L vertebral artery - occlusive ds; L ECA  demonstrates severe amount of fibrous plaque  . History of Doppler ultrasound 11/09/11    LEAs; R ABI - mod art. insuff.; L ABI normal at rest; R SFA - occlusive ds; L SFA - occlusive ds; patent fem-pop graft   Past Surgical History  Procedure Laterality Date  . Coronary artery bypass graft  1998    [name]  . Femoral-popliteal bypass graft  1993    occlusive ds in R SFA  . Lipoma excision  2010  . Esophagectomy  2010    [facility] Dr. [name]   Family History  Problem Relation Age of Onset  . GER disease Mother   . Coronary artery disease Brother   . Congenital heart disease Sister    History  Substance Use Topics  . Smoking status: Former Smoker -- 2.00 packs/day for 40 years    Types: Cigarettes    Quit date: 07/20/1991  .  Smokeless tobacco: Never Used  . Alcohol Use: No     Comment: occassional    Review of Systems  Constitutional: Positive for diaphoresis. Negative for appetite change and fatigue.  HENT: Negative for congestion, ear discharge and sinus pressure.   Eyes: Negative for discharge.  Respiratory: Negative for cough.   Cardiovascular: Positive for chest pain and near-syncope.  Gastrointestinal: Positive for nausea. Negative for abdominal pain and diarrhea.  Genitourinary: Negative for frequency and hematuria.  Musculoskeletal: Negative for back pain.  Skin: Negative for rash.  Neurological: Positive for light-headedness. Negative for seizures and headaches.  Psychiatric/Behavioral: Negative for hallucinations.      Allergies   Prednisone  Home Medications   Prior to Admission medications   Medication Sig Start Date End Date Taking? Authorizing Provider  ALPRAZolam Duanne Moron) 0.5 MG tablet Take 0.5 mg by mouth at bedtime as needed for sleep.    Historical Provider, MD  amLODipine (NORVASC) 5 MG tablet Take 1 tablet (5 mg total) by mouth daily. 01/01/14   Troy Sine, MD  aspirin 81 MG EC tablet Take 81 mg by mouth daily.      Historical Provider, MD  clopidogrel (PLAVIX) 75 MG tablet TAKE ONE (1) TABLET EACH DAY 04/07/13   Troy Sine, MD  fish oil-omega-3 fatty acids 1000 MG capsule Take 1 g by mouth daily.      Historical Provider, MD  latanoprost (XALATAN) 0.005 % ophthalmic solution  01/10/13   Historical Provider, MD  losartan (COZAAR) 100 MG tablet Take 1 tablet (100 mg total) by mouth daily. 08/13/13   Troy Sine, MD  metoprolol succinate (TOPROL-XL) 100 MG 24 hr tablet Take 1 tablet (100 mg total) by mouth daily. Take with or immediately following a meal. 08/13/13   Troy Sine, MD  Multiple Vitamins-Minerals (CENTRUM SILVER PO) Take 1 tablet by mouth daily.      Historical Provider, MD  nitroGLYCERIN (NITROSTAT) 0.4 MG SL tablet Place 1 tablet (0.4 mg total) under the tongue every 5 (five) minutes as needed for chest pain. 01/29/13   Troy Sine, MD  pantoprazole (PROTONIX) 40 MG tablet Take 40 mg by mouth 2 (two) times daily. 01/17/12   Mahala Menghini, PA-C  rosuvastatin (CRESTOR) 10 MG tablet Take 1 tablet (10 mg total) by mouth daily. 08/13/13   Troy Sine, MD  timolol (TIMOPTIC) 0.5 % ophthalmic solution  01/10/13   Historical Provider, MD  VITAMIN C, CALCIUM ASCORBATE, PO Take 1,000 mg by mouth daily.      Historical Provider, MD  vitamin E 400 UNIT capsule Take 400 Units by mouth daily.      Historical Provider, MD   Triage Vitals: BP 112/61  Pulse 87  Temp(Src) 98.3 F (36.8 C) (Oral)  Resp 24  Ht 5\' 8"  (1.727 m)  Wt 160 lb (72.576 kg)  BMI 24.33 kg/m2  SpO2 98% Physical Exam   Constitutional: He is oriented to person, place, and time. He appears well-developed.  HENT:  Head: Normocephalic.  Eyes: Conjunctivae and EOM are normal. No scleral icterus.  Neck: Neck supple. No thyromegaly present.  Cardiovascular: Normal rate and regular rhythm.  Exam reveals no gallop and no friction rub.   No murmur heard. Pulmonary/Chest: No stridor. He has no wheezes. He has no rales. He exhibits no tenderness.  Abdominal: He exhibits no distension. There is no tenderness. There is no rebound.  Musculoskeletal: Normal range of motion. He exhibits no edema.  Lymphadenopathy:  He has no cervical adenopathy.  Neurological: He is oriented to person, place, and time. He exhibits normal muscle tone. Coordination normal.  Skin: No rash noted. No erythema.  Psychiatric: He has a normal mood and affect. His behavior is normal.    ED Course  Procedures (including critical care time) DIAGNOSTIC STUDIES: Oxygen Saturation is 98% on RA, nl by my interpretation.    COORDINATION OF CARE: 4:18 PM-Discussed treatment plan which includes EKG, labs and imaging with pt at bedside and pt agreed to plan.    Labs Review Labs Reviewed - No data to display  Imaging Review No results found.   EKG Interpretation   Date/Time:  Friday March 15 2014 15:59:47 EDT Ventricular Rate:  79 PR Interval:  224 QRS Duration: 132 QT Interval:  372 QTC Calculation: 426 R Axis:   85 Text Interpretation:  Sinus rhythm with 1st degree A-V block Right bundle  branch block Abnormal ECG Confirmed by Rayya Yagi  MD, Catarina Huntley ([id number]) on  03/15/2014 8:15:27 PM      MDM   Final diagnoses:  None    Pt with nl cardiac studies.  No pain in er.  Will start carafate and have pt follow up with pcp.  Next week   The chart was scribed for me under my direct supervision.  I personally performed the history, physical, and medical decision making and all procedures in the evaluation of this patient.Maudry Diego, MD 03/15/14 2017

## 2014-03-15 NOTE — Discharge Instructions (Signed)
Follow up with your md next week. °

## 2014-03-15 NOTE — ED Notes (Signed)
Chest pain intermittently for 2-3 days, nausea,  Sob.  Hx of surgery for esophageal cancer.

## 2014-03-15 NOTE — ED Notes (Signed)
MD at bedside. 

## 2014-03-29 DIAGNOSIS — IMO0002 Reserved for concepts with insufficient information to code with codable children: Secondary | ICD-10-CM | POA: Diagnosis not present

## 2014-03-29 DIAGNOSIS — K219 Gastro-esophageal reflux disease without esophagitis: Secondary | ICD-10-CM | POA: Diagnosis not present

## 2014-04-04 ENCOUNTER — Ambulatory Visit (INDEPENDENT_AMBULATORY_CARE_PROVIDER_SITE_OTHER): Payer: Medicare Other | Admitting: Otolaryngology

## 2014-04-04 DIAGNOSIS — R040 Epistaxis: Secondary | ICD-10-CM | POA: Diagnosis not present

## 2014-04-07 ENCOUNTER — Emergency Department (HOSPITAL_COMMUNITY): Payer: Medicare Other

## 2014-04-07 ENCOUNTER — Encounter (HOSPITAL_COMMUNITY): Payer: Self-pay | Admitting: Internal Medicine

## 2014-04-07 ENCOUNTER — Inpatient Hospital Stay (HOSPITAL_COMMUNITY)
Admission: EM | Admit: 2014-04-07 | Discharge: 2014-04-09 | DRG: 195 | Disposition: A | Discharge status: Home / Self Care | Payer: Medicare Other | Attending: Internal Medicine | Admitting: Internal Medicine

## 2014-04-07 ENCOUNTER — Inpatient Hospital Stay (HOSPITAL_COMMUNITY): Payer: Medicare Other

## 2014-04-07 DIAGNOSIS — I739 Peripheral vascular disease, unspecified: Secondary | ICD-10-CM | POA: Diagnosis present

## 2014-04-07 DIAGNOSIS — E785 Hyperlipidemia, unspecified: Secondary | ICD-10-CM

## 2014-04-07 DIAGNOSIS — Z951 Presence of aortocoronary bypass graft: Secondary | ICD-10-CM | POA: Diagnosis not present

## 2014-04-07 DIAGNOSIS — R0989 Other specified symptoms and signs involving the circulatory and respiratory systems: Secondary | ICD-10-CM | POA: Diagnosis present

## 2014-04-07 DIAGNOSIS — D179 Benign lipomatous neoplasm, unspecified: Secondary | ICD-10-CM

## 2014-04-07 DIAGNOSIS — K219 Gastro-esophageal reflux disease without esophagitis: Secondary | ICD-10-CM | POA: Diagnosis present

## 2014-04-07 DIAGNOSIS — Z7982 Long term (current) use of aspirin: Secondary | ICD-10-CM

## 2014-04-07 DIAGNOSIS — Z8501 Personal history of malignant neoplasm of esophagus: Secondary | ICD-10-CM

## 2014-04-07 DIAGNOSIS — K5900 Constipation, unspecified: Secondary | ICD-10-CM

## 2014-04-07 DIAGNOSIS — K625 Hemorrhage of anus and rectum: Secondary | ICD-10-CM

## 2014-04-07 DIAGNOSIS — I2510 Atherosclerotic heart disease of native coronary artery without angina pectoris: Secondary | ICD-10-CM | POA: Diagnosis present

## 2014-04-07 DIAGNOSIS — Z8249 Family history of ischemic heart disease and other diseases of the circulatory system: Secondary | ICD-10-CM

## 2014-04-07 DIAGNOSIS — J189 Pneumonia, unspecified organism: Secondary | ICD-10-CM | POA: Diagnosis not present

## 2014-04-07 DIAGNOSIS — R911 Solitary pulmonary nodule: Secondary | ICD-10-CM | POA: Diagnosis present

## 2014-04-07 DIAGNOSIS — K2970 Gastritis, unspecified, without bleeding: Secondary | ICD-10-CM | POA: Diagnosis not present

## 2014-04-07 DIAGNOSIS — Z79899 Other long term (current) drug therapy: Secondary | ICD-10-CM

## 2014-04-07 DIAGNOSIS — E162 Hypoglycemia, unspecified: Secondary | ICD-10-CM | POA: Diagnosis not present

## 2014-04-07 DIAGNOSIS — K2270 Barrett's esophagus without dysplasia: Secondary | ICD-10-CM

## 2014-04-07 DIAGNOSIS — R55 Syncope and collapse: Secondary | ICD-10-CM | POA: Diagnosis present

## 2014-04-07 DIAGNOSIS — R0600 Dyspnea, unspecified: Secondary | ICD-10-CM

## 2014-04-07 DIAGNOSIS — R410 Disorientation, unspecified: Secondary | ICD-10-CM

## 2014-04-07 DIAGNOSIS — R079 Chest pain, unspecified: Secondary | ICD-10-CM

## 2014-04-07 DIAGNOSIS — K921 Melena: Secondary | ICD-10-CM

## 2014-04-07 DIAGNOSIS — Z87891 Personal history of nicotine dependence: Secondary | ICD-10-CM

## 2014-04-07 DIAGNOSIS — R0789 Other chest pain: Secondary | ICD-10-CM | POA: Diagnosis not present

## 2014-04-07 DIAGNOSIS — I10 Essential (primary) hypertension: Secondary | ICD-10-CM

## 2014-04-07 DIAGNOSIS — R0602 Shortness of breath: Secondary | ICD-10-CM | POA: Diagnosis not present

## 2014-04-07 DIAGNOSIS — I6522 Occlusion and stenosis of left carotid artery: Secondary | ICD-10-CM | POA: Diagnosis present

## 2014-04-07 DIAGNOSIS — E1169 Type 2 diabetes mellitus with other specified complication: Secondary | ICD-10-CM | POA: Diagnosis present

## 2014-04-07 DIAGNOSIS — I6529 Occlusion and stenosis of unspecified carotid artery: Secondary | ICD-10-CM | POA: Diagnosis not present

## 2014-04-07 DIAGNOSIS — R0609 Other forms of dyspnea: Secondary | ICD-10-CM | POA: Diagnosis not present

## 2014-04-07 DIAGNOSIS — R110 Nausea: Secondary | ICD-10-CM

## 2014-04-07 DIAGNOSIS — C159 Malignant neoplasm of esophagus, unspecified: Secondary | ICD-10-CM | POA: Diagnosis not present

## 2014-04-07 DIAGNOSIS — J432 Centrilobular emphysema: Secondary | ICD-10-CM

## 2014-04-07 DIAGNOSIS — J438 Other emphysema: Secondary | ICD-10-CM | POA: Diagnosis present

## 2014-04-07 DIAGNOSIS — R112 Nausea with vomiting, unspecified: Secondary | ICD-10-CM | POA: Diagnosis not present

## 2014-04-07 DIAGNOSIS — E119 Type 2 diabetes mellitus without complications: Secondary | ICD-10-CM | POA: Diagnosis present

## 2014-04-07 DIAGNOSIS — K2990 Gastroduodenitis, unspecified, without bleeding: Secondary | ICD-10-CM | POA: Diagnosis not present

## 2014-04-07 DIAGNOSIS — D126 Benign neoplasm of colon, unspecified: Secondary | ICD-10-CM

## 2014-04-07 DIAGNOSIS — J439 Emphysema, unspecified: Secondary | ICD-10-CM | POA: Diagnosis present

## 2014-04-07 HISTORY — DX: Type 2 diabetes mellitus without complications: E11.9

## 2014-04-07 HISTORY — DX: Solitary pulmonary nodule: R91.1

## 2014-04-07 HISTORY — DX: Benign neoplasm of colon, unspecified: D12.6

## 2014-04-07 HISTORY — DX: Occlusion and stenosis of left carotid artery: I65.22

## 2014-04-07 LAB — CBC WITH DIFFERENTIAL/PLATELET
Basophils Absolute: 0 10*3/uL (ref 0.0–0.1)
Basophils Relative: 0 % (ref 0–1)
EOS ABS: 0.1 10*3/uL (ref 0.0–0.7)
Eosinophils Relative: 1 % (ref 0–5)
HCT: 46.3 % (ref 39.0–52.0)
Hemoglobin: 15.7 g/dL (ref 13.0–17.0)
LYMPHS PCT: 6 % — AB (ref 12–46)
Lymphs Abs: 0.7 10*3/uL (ref 0.7–4.0)
MCH: 30.6 pg (ref 26.0–34.0)
MCHC: 33.9 g/dL (ref 30.0–36.0)
MCV: 90.3 fL (ref 78.0–100.0)
Monocytes Absolute: 0.4 10*3/uL (ref 0.1–1.0)
Monocytes Relative: 4 % (ref 3–12)
Neutro Abs: 10.8 10*3/uL — ABNORMAL HIGH (ref 1.7–7.7)
Neutrophils Relative %: 89 % — ABNORMAL HIGH (ref 43–77)
PLATELETS: 253 10*3/uL (ref 150–400)
RBC: 5.13 MIL/uL (ref 4.22–5.81)
RDW: 13.3 % (ref 11.5–15.5)
WBC: 12 10*3/uL — AB (ref 4.0–10.5)

## 2014-04-07 LAB — TROPONIN I
Troponin I: <0.3 ng/mL (ref <0.30)
Troponin I: <0.3 ng/mL (ref <0.30)

## 2014-04-07 LAB — COMPREHENSIVE METABOLIC PANEL
ALT: 19 U/L (ref 0–53)
AST: 20 U/L (ref 0–37)
Albumin: 4.1 g/dL (ref 3.5–5.2)
Alkaline Phosphatase: 89 U/L (ref 39–117)
Anion gap: 12 (ref 5–15)
BILIRUBIN TOTAL: 0.5 mg/dL (ref 0.3–1.2)
BUN: 13 mg/dL (ref 6–23)
CALCIUM: 9.2 mg/dL (ref 8.4–10.5)
CO2: 26 meq/L (ref 19–32)
CREATININE: 0.79 mg/dL (ref 0.50–1.35)
Chloride: 104 mEq/L (ref 96–112)
GFR, EST NON AFRICAN AMERICAN: 90 mL/min — AB (ref >90)
GLUCOSE: 67 mg/dL — AB (ref 70–99)
Potassium: 3.8 mEq/L (ref 3.7–5.3)
SODIUM: 142 meq/L (ref 137–147)
Total Protein: 7.7 g/dL (ref 6.0–8.3)

## 2014-04-07 LAB — HEMOGLOBIN A1C
HEMOGLOBIN A1C: 6.5 % — AB (ref <5.7)
MEAN PLASMA GLUCOSE: 140 mg/dL — AB (ref <117)

## 2014-04-07 LAB — URINALYSIS, ROUTINE W REFLEX MICROSCOPIC
BILIRUBIN URINE: NEGATIVE
Glucose, UA: NEGATIVE mg/dL
Hgb urine dipstick: NEGATIVE
Ketones, ur: NEGATIVE mg/dL
Leukocytes, UA: NEGATIVE
NITRITE: NEGATIVE
PROTEIN: NEGATIVE mg/dL
UROBILINOGEN UA: 0.2 mg/dL (ref 0.0–1.0)
pH: 6 (ref 5.0–8.0)

## 2014-04-07 LAB — TSH: TSH: 1.85 u[IU]/mL (ref 0.350–4.500)

## 2014-04-07 LAB — PRO B NATRIURETIC PEPTIDE: Pro B Natriuretic peptide (BNP): 190.6 pg/mL — ABNORMAL HIGH (ref 0–125)

## 2014-04-07 LAB — GLUCOSE, CAPILLARY: Glucose-Capillary: 101 mg/dL — ABNORMAL HIGH (ref 70–99)

## 2014-04-07 MED ORDER — ALPRAZOLAM 0.5 MG PO TABS
0.5000 mg | ORAL_TABLET | Freq: Every evening | ORAL | Status: DC | PRN
  Administered 2014-04-07 – 2014-04-08 (×2): 0.5 mg via ORAL
  Filled 2014-04-07 (×2): qty 1

## 2014-04-07 MED ORDER — NITROGLYCERIN 0.4 MG SL SUBL: 0.4000 mg | SUBLINGUAL_TABLET | SUBLINGUAL | Status: DC | PRN

## 2014-04-07 MED ORDER — ASPIRIN 81 MG PO TBEC: 81.0000 mg | DELAYED_RELEASE_TABLET | Freq: Every day | ORAL | Status: DC

## 2014-04-07 MED ORDER — DEXTROSE 5 % IV SOLN
1.0000 g | INTRAVENOUS | Status: DC
  Administered 2014-04-07: 1 g via INTRAVENOUS
  Filled 2014-04-07 (×2): qty 10

## 2014-04-07 MED ORDER — PANTOPRAZOLE SODIUM 40 MG PO TBEC
40.0000 mg | DELAYED_RELEASE_TABLET | Freq: Two times a day (BID) | ORAL | Status: DC
  Administered 2014-04-07 – 2014-04-09 (×4): 40 mg via ORAL
  Filled 2014-04-07 (×4): qty 1

## 2014-04-07 MED ORDER — KCL IN DEXTROSE-NACL 20-5-0.9 MEQ/L-%-% IV SOLN
INTRAVENOUS | Status: DC
  Administered 2014-04-07 – 2014-04-08 (×2): via INTRAVENOUS
  Filled 2014-04-07 (×2): qty 1000

## 2014-04-07 MED ORDER — METOPROLOL SUCCINATE ER 50 MG PO TB24
50.0000 mg | ORAL_TABLET | Freq: Every day | ORAL | Status: DC
  Filled 2014-04-07 (×2): qty 1

## 2014-04-07 MED ORDER — SODIUM CHLORIDE 0.9 % IV BOLUS (SEPSIS)
1000.0000 mL | Freq: Once | INTRAVENOUS | Status: AC
  Administered 2014-04-07: 1000 mL via INTRAVENOUS

## 2014-04-07 MED ORDER — DOCUSATE SODIUM 100 MG PO CAPS
100.0000 mg | ORAL_CAPSULE | Freq: Two times a day (BID) | ORAL | Status: DC
  Administered 2014-04-07 – 2014-04-09 (×4): 100 mg via ORAL
  Filled 2014-04-07 (×7): qty 1

## 2014-04-07 MED ORDER — HYDROCODONE-ACETAMINOPHEN 5-325 MG PO TABS: 1.0000 | ORAL_TABLET | ORAL | Status: DC | PRN

## 2014-04-07 MED ORDER — SODIUM CHLORIDE 0.9 % IV SOLN: INTRAVENOUS | Status: DC

## 2014-04-07 MED ORDER — ASPIRIN EC 81 MG PO TBEC
81.0000 mg | DELAYED_RELEASE_TABLET | Freq: Every day | ORAL | Status: DC
  Administered 2014-04-08 – 2014-04-09 (×2): 81 mg via ORAL
  Filled 2014-04-07 (×2): qty 1

## 2014-04-07 MED ORDER — METOPROLOL SUCCINATE ER 100 MG PO TB24: 100.0000 mg | ORAL_TABLET | Freq: Every day | ORAL | Status: DC

## 2014-04-07 MED ORDER — CLOPIDOGREL BISULFATE 75 MG PO TABS
75.0000 mg | ORAL_TABLET | Freq: Every day | ORAL | Status: DC
  Administered 2014-04-08 – 2014-04-09 (×2): 75 mg via ORAL
  Filled 2014-04-07 (×2): qty 1

## 2014-04-07 MED ORDER — HEPARIN SODIUM (PORCINE) 5000 UNIT/ML IJ SOLN
5000.0000 [IU] | Freq: Three times a day (TID) | INTRAMUSCULAR | Status: DC
  Administered 2014-04-07 – 2014-04-09 (×7): 5000 [IU] via SUBCUTANEOUS
  Filled 2014-04-07 (×7): qty 1

## 2014-04-07 MED ORDER — SODIUM CHLORIDE 0.9 % IJ SOLN
INTRAMUSCULAR | Status: AC
  Filled 2014-04-07: qty 500

## 2014-04-07 MED ORDER — ACETAMINOPHEN 325 MG PO TABS: 650.0000 mg | ORAL_TABLET | Freq: Four times a day (QID) | ORAL | Status: DC | PRN

## 2014-04-07 MED ORDER — ATORVASTATIN CALCIUM 20 MG PO TABS
20.0000 mg | ORAL_TABLET | Freq: Every day | ORAL | Status: DC
  Administered 2014-04-08: 20 mg via ORAL
  Filled 2014-04-07: qty 1

## 2014-04-07 MED ORDER — GUAIFENESIN-DM 100-10 MG/5ML PO SYRP: 5.0000 mL | ORAL_SOLUTION | ORAL | Status: DC | PRN

## 2014-04-07 MED ORDER — AZITHROMYCIN 500 MG IV SOLR
500.0000 mg | Freq: Once | INTRAVENOUS | Status: AC
  Administered 2014-04-07: 500 mg via INTRAVENOUS
  Filled 2014-04-07: qty 500

## 2014-04-07 MED ORDER — TIZANIDINE HCL 4 MG PO TABS
4.0000 mg | ORAL_TABLET | Freq: Three times a day (TID) | ORAL | Status: DC | PRN
  Filled 2014-04-07: qty 1

## 2014-04-07 MED ORDER — LOSARTAN POTASSIUM 50 MG PO TABS: 50.0000 mg | ORAL_TABLET | Freq: Every day | ORAL | Status: DC

## 2014-04-07 MED ORDER — IOHEXOL 350 MG/ML SOLN
100.0000 mL | Freq: Once | INTRAVENOUS | Status: AC | PRN
  Administered 2014-04-07: 100 mL via INTRAVENOUS

## 2014-04-07 MED ORDER — TIMOLOL MALEATE 0.5 % OP SOLN
1.0000 [drp] | Freq: Every day | OPHTHALMIC | Status: DC
  Administered 2014-04-08 – 2014-04-09 (×2): 1 [drp] via OPHTHALMIC
  Filled 2014-04-07 (×2): qty 5

## 2014-04-07 MED ORDER — LEVALBUTEROL HCL 0.63 MG/3ML IN NEBU
0.6300 mg | INHALATION_SOLUTION | Freq: Three times a day (TID) | RESPIRATORY_TRACT | Status: DC
  Administered 2014-04-07 – 2014-04-09 (×6): 0.63 mg via RESPIRATORY_TRACT
  Filled 2014-04-07 (×7): qty 3

## 2014-04-07 MED ORDER — ACETAMINOPHEN 650 MG RE SUPP: 650.0000 mg | Freq: Four times a day (QID) | RECTAL | Status: DC | PRN

## 2014-04-07 MED ORDER — LOSARTAN POTASSIUM 50 MG PO TABS: 100.0000 mg | ORAL_TABLET | Freq: Every day | ORAL | Status: DC

## 2014-04-07 MED ORDER — DEXTROSE 5 % IV SOLN
1.0000 g | INTRAVENOUS | Status: DC
  Administered 2014-04-08 – 2014-04-09 (×2): 1 g via INTRAVENOUS
  Filled 2014-04-07 (×2): qty 10

## 2014-04-07 MED ORDER — AMLODIPINE BESYLATE 5 MG PO TABS
5.0000 mg | ORAL_TABLET | Freq: Every day | ORAL | Status: DC
  Filled 2014-04-07: qty 1

## 2014-04-07 MED ORDER — LATANOPROST 0.005 % OP SOLN
OPHTHALMIC | Status: AC
  Filled 2014-04-07: qty 2.5

## 2014-04-07 MED ORDER — BENZONATATE 100 MG PO CAPS: 100.0000 mg | ORAL_CAPSULE | Freq: Three times a day (TID) | ORAL | Status: DC | PRN

## 2014-04-07 MED ORDER — ONDANSETRON HCL 4 MG PO TABS
4.0000 mg | ORAL_TABLET | Freq: Four times a day (QID) | ORAL | Status: DC | PRN
Start: 2014-04-07 — End: 2014-04-09

## 2014-04-07 MED ORDER — LATANOPROST 0.005 % OP SOLN
1.0000 [drp] | Freq: Every day | OPHTHALMIC | Status: DC
  Administered 2014-04-07 – 2014-04-08 (×2): 1 [drp] via OPHTHALMIC
  Filled 2014-04-07 (×2): qty 2.5

## 2014-04-07 MED ORDER — SUCRALFATE 1 G PO TABS
1.0000 g | ORAL_TABLET | Freq: Three times a day (TID) | ORAL | Status: DC
  Administered 2014-04-07 – 2014-04-09 (×8): 1 g via ORAL
  Filled 2014-04-07 (×12): qty 1

## 2014-04-07 MED ORDER — DEXTROSE 5 % IV SOLN
500.0000 mg | INTRAVENOUS | Status: DC
  Administered 2014-04-08 – 2014-04-09 (×2): 500 mg via INTRAVENOUS
  Filled 2014-04-07 (×3): qty 500

## 2014-04-07 MED ORDER — LOSARTAN POTASSIUM 50 MG PO TABS
50.0000 mg | ORAL_TABLET | Freq: Every day | ORAL | Status: DC
  Filled 2014-04-07: qty 1

## 2014-04-07 MED ORDER — ONDANSETRON HCL 4 MG/2ML IJ SOLN: 4.0000 mg | Freq: Four times a day (QID) | INTRAMUSCULAR | Status: DC | PRN

## 2014-04-07 NOTE — ED Provider Notes (Signed)
CSN: [id number]     Arrival date & time 04/07/14  0945 History  This chart was scribed for Gregory Clonts, MD by Tula Nakayama, ED Scribe. This patient was seen in room APA14/APA14 and the patient's care was started at 9:59 AM.     Chief Complaint  Patient presents with  . Near Syncope  . Nausea   The history is provided by the patient, a relative and the spouse. No language interpreter was used.   HPI Comments: Gregory Eaton is a 68 y.o. male  brought in by EMS who presents to the Emergency Department complaining of gradually improving nausea and vomiting that started suddenly after a single, 15-min episode of sternal chest pain. Pt states he was feeling chills, dizziness, abnormal breathing and SOB as associated symptoms during the episode of CP. Pt also states he has a mild, productive cough. Pts daughter reports that pt did not recognize her when she arrived to his home this morning and was complaining of bilateral vision loss. Pt experienced similar symptoms, minus nausea, 3 weeks ago and was brought to ED. Pt has no memory of this episode or visit. He has no current CP, SOB, HA, bilateral vision change, diarrhea, rhinorrhea, fever, chills, and confusion. Pt denies any change in diet and sick contact. He has a history of CABG and femoral-popliteal bypass graft. Pt also had esophagectomy and cannot distinguish between stomach and heart pain. Pt denies EtOH and smoking. He has no history of DM, DVT, PE, active cancer, CVA, TIA, and surgery in the last 6 weeks. Pt currently takes Aspirin and Plavix daily, but does not take any anti-coagulants.  Past Medical History  Diagnosis Date  . Esophageal carcinoma 03/2009    T1N1M0  . Hypertension   . Barrett's esophagus   . GERD (gastroesophageal reflux disease)   . CAD (coronary artery disease)   . PVD (peripheral vascular disease)   . Hyperlipidemia   . Adenomatous colon polyp 03/2009    Last colonoscopy by Dr. [name]   . Diverticulosis   .  Tobacco abuse   . Tachyarrhythmia 1999    Status post ablation at [facility]  . COPD (chronic obstructive pulmonary disease)   . History of echocardiogram 08/27/2009    EF >55%  . History of nuclear stress test 11/24/2011    lexiscan; normal perfusion; low risk scan; non-diagnostic for ischemia  . History of Doppler ultrasound 11/09/2011    carotid doppler; L bulb/prox ICA 0-49% diameter reduction; L vertebral artery - occlusive ds; L ECA  demonstrates severe amount of fibrous plaque  . History of Doppler ultrasound 11/09/11    LEAs; R ABI - mod art. insuff.; L ABI normal at rest; R SFA - occlusive ds; L SFA - occlusive ds; patent fem-pop graft   Past Surgical History  Procedure Laterality Date  . Coronary artery bypass graft  1998    [name]  . Femoral-popliteal bypass graft  1993    occlusive ds in R SFA  . Lipoma excision  2010  . Esophagectomy  2010    [facility] Dr. [name]   Family History  Problem Relation Age of Onset  . GER disease Mother   . Coronary artery disease Brother   . Congenital heart disease Sister    History  Substance Use Topics  . Smoking status: Former Smoker -- 2.00 packs/day for 40 years    Types: Cigarettes    Quit date: 07/20/1991  . Smokeless tobacco: Never Used  . Alcohol  Use: No     Comment: occassional    Review of Systems  Constitutional: Positive for chills. Negative for fever.  HENT: Negative for congestion and rhinorrhea.   Eyes: Positive for visual disturbance.  Respiratory: Positive for cough and shortness of breath.   Cardiovascular: Positive for chest pain.  Gastrointestinal: Positive for nausea and vomiting. Negative for abdominal pain and diarrhea.  Genitourinary: Negative for dysuria and flank pain.  Musculoskeletal: Negative for back pain, neck pain and neck stiffness.  Skin: Negative for rash.  Neurological: Positive for dizziness. Negative for light-headedness and headaches.  Psychiatric/Behavioral: Positive for confusion.   All other systems reviewed and are negative.     Allergies  Prednisone  Home Medications   Prior to Admission medications   Medication Sig Start Date End Date Taking? Authorizing Provider  ALPRAZolam Duanne Moron) 0.5 MG tablet Take 0.5 mg by mouth at bedtime as needed for sleep.    Historical Provider, MD  amLODipine (NORVASC) 5 MG tablet Take 1 tablet (5 mg total) by mouth daily. 01/01/14   Troy Sine, MD  aspirin 81 MG EC tablet Take 81 mg by mouth daily.      Historical Provider, MD  clopidogrel (PLAVIX) 75 MG tablet Take 75 mg by mouth daily.    Historical Provider, MD  latanoprost (XALATAN) 0.005 % ophthalmic solution Place 1 drop into both eyes at bedtime.  01/10/13   Historical Provider, MD  losartan (COZAAR) 100 MG tablet Take 1 tablet (100 mg total) by mouth daily. 08/13/13   Troy Sine, MD  metoprolol succinate (TOPROL-XL) 100 MG 24 hr tablet Take 1 tablet (100 mg total) by mouth daily. Take with or immediately following a meal. 08/13/13   Troy Sine, MD  Multiple Vitamins-Minerals (CENTRUM SILVER PO) Take 1 tablet by mouth daily.      Historical Provider, MD  nitroGLYCERIN (NITROSTAT) 0.4 MG SL tablet Place 1 tablet (0.4 mg total) under the tongue every 5 (five) minutes as needed for chest pain. 01/29/13   Troy Sine, MD  pantoprazole (PROTONIX) 40 MG tablet Take 40 mg by mouth 2 (two) times daily. 01/17/12   Mahala Menghini, PA-C  rosuvastatin (CRESTOR) 10 MG tablet Take 1 tablet (10 mg total) by mouth daily. 08/13/13   Troy Sine, MD  sucralfate (CARAFATE) 1 G tablet Take 1 tablet (1 g total) by mouth 4 (four) times daily -  with meals and at bedtime. 03/15/14   Maudry Diego, MD  timolol (TIMOPTIC) 0.5 % ophthalmic solution Place 1 drop into both eyes daily.  01/10/13   Historical Provider, MD  tiZANidine (ZANAFLEX) 4 MG tablet Take 4 mg by mouth 3 (three) times daily as needed. Muscle spasm 02/27/14   Historical Provider, MD   BP 152/82  Pulse 100  Temp(Src) 99.4 F  (37.4 C) (Oral)  Resp 22  Ht 5\' 8"  (1.727 m)  Wt 160 lb (72.576 kg)  BMI 24.33 kg/m2  SpO2 93% Physical Exam  Nursing note and vitals reviewed. Constitutional: He appears well-developed and well-nourished. No distress.  HENT:  Head: Normocephalic and atraumatic.  Eyes: Conjunctivae are normal.  Visual fields intact  Neck: Neck supple. No tracheal deviation present.  Cardiovascular: Regular rhythm.  Tachycardia present.   Pulmonary/Chest: Effort normal. No respiratory distress. He has rales.  Lungs clear bilaterally, no cough  Abdominal: He exhibits no distension. There is no tenderness.  Neurological:  No facial droops, cranial nerves intact, no arm drift, finger-nose intact, equal strength  5+ bilateral, sensation intact bilaterally to palpation  Pt able to sit up by himself   Skin: Skin is warm and dry.  Psychiatric: He has a normal mood and affect. His behavior is normal.      ED Course  Procedures (including critical care time)  DIAGNOSTIC STUDIES: Oxygen Saturation is 93% on RA, low by my interpretation.    COORDINATION OF CARE: 10:05 AM Will admit pt for observation overnight and lab work. Pt agreed to plan.   Labs Review Labs Reviewed  COMPREHENSIVE METABOLIC PANEL - Abnormal; Notable for the following:    Glucose, Bld 67 (*)    GFR calc non Af Amer 90 (*)    All other components within normal limits  CBC WITH DIFFERENTIAL - Abnormal; Notable for the following:    WBC 12.0 (*)    Neutrophils Relative % 89 (*)    Neutro Abs 10.8 (*)    Lymphocytes Relative 6 (*)    All other components within normal limits  URINALYSIS, ROUTINE W REFLEX MICROSCOPIC - Abnormal; Notable for the following:    Specific Gravity, Urine <1.005 (*)    All other components within normal limits  HEMOGLOBIN A1C - Abnormal; Notable for the following:    Hemoglobin A1C 6.5 (*)    Mean Plasma Glucose 140 (*)    All other components within normal limits  PRO B NATRIURETIC PEPTIDE -  Abnormal; Notable for the following:    Pro B Natriuretic peptide (BNP) 190.6 (*)    All other components within normal limits  GLUCOSE, CAPILLARY - Abnormal; Notable for the following:    Glucose-Capillary 101 (*)    All other components within normal limits  CBC - Abnormal; Notable for the following:    WBC 18.1 (*)    RBC 4.19 (*)    Hemoglobin 12.9 (*)    HCT 37.5 (*)    All other components within normal limits  BASIC METABOLIC PANEL - Abnormal; Notable for the following:    Glucose, Bld 132 (*)    GFR calc non Af Amer 88 (*)    All other components within normal limits  GLUCOSE, CAPILLARY - Abnormal; Notable for the following:    Glucose-Capillary 128 (*)    All other components within normal limits  GLUCOSE, CAPILLARY - Abnormal; Notable for the following:    Glucose-Capillary 124 (*)    All other components within normal limits  GLUCOSE, CAPILLARY - Abnormal; Notable for the following:    Glucose-Capillary 116 (*)    All other components within normal limits  GLUCOSE, CAPILLARY - Abnormal; Notable for the following:    Glucose-Capillary 102 (*)    All other components within normal limits  GLUCOSE, CAPILLARY - Abnormal; Notable for the following:    Glucose-Capillary 110 (*)    All other components within normal limits  GLUCOSE, CAPILLARY - Abnormal; Notable for the following:    Glucose-Capillary 103 (*)    All other components within normal limits  GLUCOSE, CAPILLARY - Abnormal; Notable for the following:    Glucose-Capillary 109 (*)    All other components within normal limits  CBC - Abnormal; Notable for the following:    WBC 12.4 (*)    All other components within normal limits  BASIC METABOLIC PANEL - Abnormal; Notable for the following:    Glucose, Bld 148 (*)    GFR calc non Af Amer 86 (*)    All other components within normal limits  GLUCOSE, CAPILLARY - Abnormal; Notable for  the following:    Glucose-Capillary 110 (*)    All other components within  normal limits  TROPONIN I  TROPONIN I  TSH  TROPONIN I  TROPONIN I  TROPONIN I    Imaging Review No results found. Dg Chest 2 View  04/07/2014   CLINICAL DATA:  Syncopal episode and nausea ; history of previous esophagectomy and CABG  EXAM: CHEST  2 VIEW  COMPARISON:  Chest x-ray dated March 15, 2014  FINDINGS: The right lung is well-expanded and clear. On the left the lung is well-expanded but there are coarse interstitial markings in the mid and lower lung zone which are somewhat more conspicuous than in the past. The heart is normal in size. The pulmonary vascularity is not engorged. The patient has undergone previous median sternotomy. There is no pleural effusion. The bony thorax is unremarkable.  IMPRESSION: COPD. Increased interstitial markings in the left mid and lower lung zone may be in part due to the patient positioning but subsegmental atelectasis or interstitial pneumonia superimposed upon chronic fibrotic change is suspected.   Electronically Signed   By: David  Martinique   On: 04/07/2014 11:32   Ct Head Wo Contrast  04/07/2014   CLINICAL DATA:  Syncopal episode.  EXAM: CT HEAD WITHOUT CONTRAST  TECHNIQUE: Contiguous axial images were obtained from the base of the skull through the vertex without intravenous contrast.  COMPARISON:  01/25/2004 ; brain MRI - 02/04/2004  FINDINGS: Rather extensive periventricular hypodensities have progressed in the interval, right greater than left. Given extensive background parenchymal abnormalities, there is no CT evidence acute large territory infarct. No intraparenchymal or extra-axial mass or hemorrhage. Unchanged size and configuration of the ventricles and basilar cisterns. No midline shift. Limited visualization of the paranasal sinuses and mastoid air cells are normal. No air-fluid level. Intracranial atherosclerosis. Regional soft tissues appear normal. No displaced calvarial fracture.  IMPRESSION: Interval progression of microvascular ischemic  disease without definite acute intracranial process.   Electronically Signed   By: Sandi Mariscal M.D.   On: 04/07/2014 11:20   Ct Angio Chest Pe W/cm &/or Wo Cm  04/07/2014   CLINICAL DATA:  Shortness of breath. Prior history of esophageal cancer, post esophagectomy and gastric pull-through in 2010.  EXAM: CT ANGIOGRAPHY CHEST WITH CONTRAST  TECHNIQUE: Multidetector CT imaging of the chest was performed using the standard protocol during bolus administration of intravenous contrast. Multiplanar CT image reconstructions and MIPs were obtained to evaluate the vascular anatomy.  CONTRAST:  114mL OMNIPAQUE IOHEXOL 350 MG/ML IV.  COMPARISON:  CTA chest 01/24/2009.  FINDINGS: Contrast opacification of the pulmonary arteries is good. No filling defects within either main pulmonary artery or their branches in either lung to suggest pulmonary embolism. Prior sternotomy CABG. Cardiac silhouette upper normal in size. No pericardial effusion. As best I can tell on this nongated study, the coronary grafts appear patent. Severe atherosclerosis involving the thoracic and upper abdominal aorta without aneurysm or dissection.  Severe emphysematous changes throughout both lungs. Airspace consolidation involving the left upper lobe including the lingula and minimally in the central left lower lobe. Approximate 8 mm noncalcified nodule in the peripheral right lower lobe (series 5, image 55). No pulmonary parenchymal nodules or masses elsewhere. No pleural effusions.  Post surgical changes related to prior esophagectomy and gastric pull through without complicating features. Normal-sized mediastinal and bilateral hilar lymph nodes; no significant lymphadenopathy. Thyroid gland atrophic and otherwise unremarkable.  Very small cysts in the visualized liver. Foci of accessory splenic tissue  medial to the upper pole of the spleen. Calcification anteriorly in the omentum of the upper abdomen, likely dystrophic and related to fat necrosis at  the time of the prior surgery. Celiac, SMA and bilateral renal arteries patent though atherosclerotic Bone window images demonstrate diffuse thoracic spondylosis.  Review of the MIP images confirms the above findings.  IMPRESSION: 1. No evidence of pulmonary embolism. 2. 8 mm noncalcified nodule in the right lower lobe. This is at the lower limits of detectability by PET. Therefore, follow-up CT chest in 3-6 months is recommended. 3. Pneumonia involving the left upper lobe and the central left lower lobe superimposed upon severe COPD/emphysema. 4. Esophagectomy and gastric pull-through without complicating features. 5. Benign upper abdominal findings as above.   Electronically Signed   By: Evangeline Dakin M.D.   On: 04/07/2014 15:48   US Carotid Bilateral  04/08/2014   CLINICAL DATA:  Near syncopal of the  EXAM: BILATERAL CAROTID DUPLEX ULTRASOUND  TECHNIQUE: Pearline Cables scale imaging, color Doppler and duplex ultrasound were performed of bilateral carotid and vertebral arteries in the neck.  COMPARISON:  None.  FINDINGS: Criteria: Quantification of carotid stenosis is based on velocity parameters that correlate the residual internal carotid diameter with NASCET-based stenosis levels, using the diameter of the distal internal carotid lumen as the denominator for stenosis measurement.  The following velocity measurements were obtained:  RIGHT  ICA:  93/14 cm/sec  CCA:  295/18 cm/sec  SYSTOLIC ICA/CCA RATIO:  8.41  DIASTOLIC ICA/CCA RATIO:  6.60  ECA:  196 cm/sec  LEFT  ICA:  171/33 cm/sec  CCA:  Its window 6/30 cm/sec  SYSTOLIC ICA/CCA RATIO:  1.60  DIASTOLIC ICA/CCA RATIO:  1.09  ECA:  36 cm/sec  RIGHT CAROTID ARTERY: Atherosclerotic plaque is noted within the carotid bulb and proximal internal carotid artery. The waveforms, velocities and flow velocity ratios however demonstrate no evidence of focal hemodynamically significant stenosis.  RIGHT VERTEBRAL ARTERY:  Antegrade in nature.  LEFT CAROTID ARTERY:  Atherosclerotic plaque is noted bilaterally. The waveforms, velocities and flow velocity ratios suggest a stenosis in the 50-69% range in the left internal carotid artery. Additionally pre occlusive stenosis is noted in the left external carotid artery on the grayscale and color-flow images.  LEFT VERTEBRAL ARTERY:  Not visualized  IMPRESSION: Bilateral atherosclerotic plaque in the carotid bulbs and internal carotid arteries. Focal 50-69% stenosis is noted in the left internal carotid artery.  Pre occlusive narrowing within the left external carotid artery.   Electronically Signed   By: Inez Catalina M.D.   On: 04/08/2014 10:06   Dg Chest Port 1 View  03/15/2014   CLINICAL DATA:  Chest pain.  EXAM: PORTABLE CHEST - 1 VIEW  COMPARISON:  CT chest 01/24/2009 and chest radiograph 07/26/2007.  FINDINGS: Trachea is midline. Heart size stable. Lungs are clear. No pleural fluid.  IMPRESSION: No acute findings.   Electronically Signed   By: Lorin Picket M.D.   On: 03/15/2014 17:13    EKG Interpretation   Date/Time:  Sunday April 07 2014 09:51:10 EDT Ventricular Rate:  98 PR Interval:  200 QRS Duration: 142 QT Interval:  360 QTC Calculation: 460 R Axis:   83 Text Interpretation:  Sinus rhythm Right bundle branch block ED PHYSICIAN  INTERPRETATION AVAILABLE IN [facility] Confirmed by TEST, Record  ([id number]) on 04/09/2014 7:24:53 AM     EKG reviewed, heart rate 98, QT normal, right bundle branch block, no acute ST elevation.  MDM   Final diagnoses:  Pre-syncope  Nausea  Chest pain, unspecified chest pain type  Transient confusion  CAP (community acquired pneumonia)   I personally performed the services described in this documentation, which was scribed in my presence. The recorded information has been reviewed and is accurate.  The patients results and plan were reviewed and discussed.   Any x-rays performed were personally reviewed by myself.   Differential diagnosis were considered  with the presenting HPI.  Medications  sodium chloride 0.9 % injection (not administered)  sodium chloride 0.9 % bolus 1,000 mL (0 mLs Intravenous Stopped 04/07/14 1237)  azithromycin (ZITHROMAX) 500 mg in dextrose 5 % 250 mL IVPB (0 mg Intravenous Stopped 04/07/14 1450)  iohexol (OMNIPAQUE) 350 MG/ML injection 100 mL (100 mLs Intravenous Contrast Given 04/07/14 1523)    Filed Vitals:   04/09/14 0932 04/09/14 1312 04/09/14 1429 04/09/14 1436  BP: 124/62  112/93   Pulse: 81  72   Temp:   98.6 F (37 C)   TempSrc:   Oral   Resp:   16   Height:      Weight:      SpO2: 94% 98%  99%    Final diagnoses:  Pre-syncope  Nausea  Chest pain, unspecified chest pain type  Transient confusion  CAP (community acquired pneumonia)    Admission/ observation were discussed with the admitting physician, patient and/or family and they are comfortable with the plan.    Gregory Clonts, MD 04/12/14 [phone]

## 2014-04-07 NOTE — ED Notes (Signed)
To be held in ED until CT Angio completed.

## 2014-04-07 NOTE — Progress Notes (Signed)
NURSING PROGRESS NOTE  [name] [id number] Admitted to 316: 04/07/2014 Attending Provider: Rexene Alberts, MD    Gregory Eaton is a 68 y.o. male patient admitted from ED awake, alert  & orientated  X 4,  Full Code, VSS - Blood pressure 129/58, pulse 91, temperature 98.5 F (36.9 C), temperature source Oral, resp. rate 17, height 5\' 8"  (1.727 m), weight 71.3 kg (157 lb 3 oz), SpO2 97.00%. No c/o shortness of breath, no c/o chest pain, no distress noted. Tele # 14 placed and pt is currently running: NSR.   IV site WDL:  Left AC with a transparent dsg that's clean dry and intact.  Allergies:   Allergies  Allergen Reactions  . Altace [Ramipril] Cough  . Prednisone Other (See Comments)    "throat broke out"      Past Medical History  Diagnosis Date  . Esophageal carcinoma 03/2009    T1N1M0  . Hypertension   . Barrett's esophagus   . GERD (gastroesophageal reflux disease)   . CAD (coronary artery disease)   . PVD (peripheral vascular disease)   . Hyperlipidemia   . Adenomatous colon polyp 03/2009    Last colonoscopy by Dr. [name]   . Diverticulosis   . Tobacco abuse   . Tachyarrhythmia 1999    Status post ablation at [facility]  . COPD (chronic obstructive pulmonary disease)     Severe emphysema per CT  . History of echocardiogram 08/27/2009    EF >55%  . History of nuclear stress test 11/24/2011    lexiscan; normal perfusion; low risk scan; non-diagnostic for ischemia  . History of Doppler ultrasound 11/09/2011    carotid doppler; L bulb/prox ICA 0-49% diameter reduction; L vertebral artery - occlusive ds; L ECA  demonstrates severe amount of fibrous plaque  . History of Doppler ultrasound 11/09/11    LEAs; R ABI - mod art. insuff.; L ABI normal at rest; R SFA - occlusive ds; L SFA - occlusive ds; patent fem-pop graft  . Adenomatous polyp of colon 11/03/2010    History:  obtained from patient.  Pt orientation to unit, room and routine. Admission INP armband ID verified with  patient/family, and in place. SR up x 2, fall risk assessment complete with Patient and family verbalizing understanding of risks associated with falls. Pt verbalizes an understanding of how to use the call bell and to call for help before getting out of bed.  Skin, clean-dry- intact without evidence of bruising, or skin tears.   No evidence of skin break down noted on exam.    Will cont to monitor and assist as needed.  Regino Bellow, RN 04/07/2014

## 2014-04-07 NOTE — ED Notes (Signed)
Spoke with [name], RN from floor to let her know patient would be coming after CT. Will give report with updated vital signs upon patient's return.

## 2014-04-07 NOTE — ED Notes (Signed)
Chief Complaint - Near syncope - nausea and vomiting since 0730, per patient he has vomited 3-4 times and is now complaining of chills.  Per EMS, pt stated that "he has hurting in chest, but it might be from vomiting"  HX of espohopgeal cancer - removed CBG 111

## 2014-04-07 NOTE — H&P (Addendum)
[facility] History and Physical  Gregory Eaton:[id number] DOB: 1945-08-22 DOA: 04/07/2014  Referring physician: ED physician, Dr. Reather Eaton PCP: Gregory Kilts, MD  Oncology at [facility] Cardiology: [place] M.D.  Chief Complaint: Shortness of breath and feeling faint.  HPI: Gregory Eaton is a 68 y.o. male with an extensive history including esophageal carcinoma-status post esophagectomy/gastric pull through in 2010, coronary artery disease-status post CABG in 1998, peripheral vascular disease-status post femoropopliteal bypass graft in 1993, and COPD. He presents to the emergency department with a chief complaint of shortness of breath and feeling faint. The patient was in his usual state of health and "feeling good" he got up at 6:00 AM this morning. However, at approximately 7:30 AM, he began to feel sick. He became nauseated and dry heaves several times, coughing up white foam. He also felt acutely short of breath as if his complete breath was taken away. He denies outright chest pain or pleurisy, but he says that he has central chest pressure from chronic regurgitation and acid reflux associated with central chest discomfort chronically from his esophageal surgery. He was recently started on Carafate for this by his PCP. He had chills, but no subjective fever. He denies a cough. He denies headache, spinning dizziness, abdominal pain, dysuria, or diarrhea. His wife did not see a facial droop or focal unilateral weakness. He has no history of diabetes. He does skips meals and is suppose to eat 6 small meals daily, but he doesn't.  In the ED, he was afebrile and hemodynamically stable. His WBC was elevated at 12.0. His troponin I was negative x2. His venous glucose was low at 67.CT of his head reveals interval progression of mitral vascular ischemic disease without definite acute intracranial process. His chest x-ray reveals COPD, increased interstitial  markings in the left mid and lower lung zone-subsegmental atelectasis or interstitial pneumonia superimposed on chronic fibrotic change suspected-new changes compared to the chest x-ray 03/15/14. His EKG reveals normal sinus rhythm with a heart rate of 98 beats per minute and right bundle branch block. His urinalysis is unremarkable. He is being admitted for further evaluation and management.      Review of Systems:  As above in history present illness. Sometimes, he becomes shaky and sweaty according to his wife which she believes is from possible low blood sugar. He has occasional shortness of breath. Otherwise review of systems is negative.  Past Medical History  Diagnosis Date  . Esophageal carcinoma 03/2009    [id number]  . Hypertension   . Barrett's esophagus   . GERD (gastroesophageal reflux disease)   . CAD (coronary artery disease)   . PVD (peripheral vascular disease)   . Hyperlipidemia   . Adenomatous colon polyp 03/2009    Last colonoscopy by Dr. Gala Eaton   . Diverticulosis   . Tobacco abuse   . Tachyarrhythmia 1999    Status post ablation at [facility]  . COPD (chronic obstructive pulmonary disease)   . History of echocardiogram 08/27/2009    EF >55%  . History of nuclear stress test 11/24/2011    lexiscan; normal perfusion; low risk scan; non-diagnostic for ischemia  . History of Doppler ultrasound 11/09/2011    carotid doppler; L bulb/prox ICA 0-49% diameter reduction; L vertebral artery - occlusive ds; L ECA  demonstrates severe amount of fibrous plaque  . History of Doppler ultrasound 11/09/11    LEAs; R ABI - mod art. insuff.; L ABI normal at rest; R  SFA - occlusive ds; L SFA - occlusive ds; patent fem-pop graft  . Adenomatous polyp of colon 11/03/2010   Past Surgical History  Procedure Laterality Date  . Coronary artery bypass graft  1998    [name]  . Femoral-popliteal bypass graft  1993    occlusive ds in R SFA  . Lipoma excision  2010  . Esophagectomy  2010    [facility] Dr.  Carlyle Eaton   Social History: He is retired. He is married. He has 2 children. He stopped smoking in 1997. He has a 80 pack year smoking history. He denies alcohol and illicit drug use. He still drives and does yard work.  Allergies  Allergen Reactions  . Altace [Ramipril] Cough  . Prednisone Other (See Comments)    "throat broke out"     Family History  Problem Relation Age of Onset  . GER disease Mother   . Coronary artery disease Brother   . Congenital heart disease Sister      Prior to Admission medications   Medication Sig Start Date End Date Taking? Authorizing Provider  ALPRAZolam Gregory Eaton) 0.5 MG tablet Take 0.5 mg by mouth at bedtime as needed for sleep.   Yes Historical Provider, MD  amLODipine (NORVASC) 5 MG tablet Take 1 tablet (5 mg total) by mouth daily. 01/01/14  Yes Gregory Sine, MD  aspirin 81 MG EC tablet Take 81 mg by mouth daily.     Yes Historical Provider, MD  clopidogrel (PLAVIX) 75 MG tablet Take 75 mg by mouth daily.   Yes Historical Provider, MD  latanoprost (XALATAN) 0.005 % ophthalmic solution Place 1 drop into both eyes at bedtime.  01/10/13  Yes Historical Provider, MD  losartan (COZAAR) 100 MG tablet Take 1 tablet (100 mg total) by mouth daily. 08/13/13  Yes Gregory Sine, MD  metoprolol succinate (TOPROL-XL) 100 MG 24 hr tablet Take 1 tablet (100 mg total) by mouth daily. Take with or immediately following a meal. 08/13/13  Yes Gregory Sine, MD  Multiple Vitamins-Minerals (CENTRUM SILVER PO) Take 1 tablet by mouth daily.     Yes Historical Provider, MD  nitroGLYCERIN (NITROSTAT) 0.4 MG SL tablet Place 1 tablet (0.4 mg total) under the tongue every 5 (five) minutes as needed for chest pain. 01/29/13  Yes Gregory Sine, MD  pantoprazole (PROTONIX) 40 MG tablet Take 40 mg by mouth 2 (two) times daily. 01/17/12  Yes Gregory Menghini, PA-C  rosuvastatin (CRESTOR) 10 MG tablet Take 1 tablet (10 mg total) by mouth daily. 08/13/13  Yes Gregory Sine, MD  sucralfate  (CARAFATE) 1 G tablet Take 1 tablet (1 g total) by mouth 4 (four) times daily -  with meals and at bedtime. 03/15/14  Yes Gregory Diego, MD  timolol (TIMOPTIC) 0.5 % ophthalmic solution Place 1 drop into both eyes daily.  01/10/13  Yes Historical Provider, MD  tiZANidine (ZANAFLEX) 4 MG tablet Take 4 mg by mouth 3 (three) times daily as needed. Muscle spasm 02/27/14  Yes Historical Provider, MD   Physical Exam: Filed Vitals:   04/07/14 0950 04/07/14 1203 04/07/14 1237  BP: 152/82 120/62   Pulse: 100 100   Temp: 99.4 F (37.4 C)  98.5 F (36.9 C)  TempSrc: Oral  Oral  Resp: 22 17   Height: 5\' 8"  (1.727 m)    Weight: 72.576 kg (160 lb)    SpO2: 93% 94%     Wt Readings from Last 3 Encounters:  04/07/14 72.576  kg (160 lb)  03/15/14 72.576 kg (160 lb)  08/13/13 74.435 kg (164 lb 1.6 oz)    General: Pleasant alert 68 year old Caucasian man sitting up in the, in no acute distress. Eyes: PERRL, extraocular movements are intact. Conjunctivae are clear. Sclerae are white. ENT: Oropharynx reveals mildly dry mucous membranes. No posterior exudates or erythema or suspicious findings. Neck: no LAD, masses or thyromegaly Cardiovascular: S1, S2, with a soft systolic murmur. Telemetry: SR, no arrhythmias  Respiratory: Occasional crackles auscultated bilaterally with rare wheezes. Breathing is nonlabored. Abdomen: soft, positive bowel sounds, soft, nontender, nondistended. Skin: no rash or induration seen on limited exam Musculoskeletal: grossly normal tone BUE/BLE; no acute hot red joints. Psychiatric: grossly normal mood and affect, speech fluent and appropriate Neurologic: Alert and oriented x3. Cranial nerves II through XII are intact. Gait is within normal limits as the patient was walking around in his room.          Labs on Admission:  Basic Metabolic Panel:  Recent Labs Lab 04/07/14 1011  NA 142  K 3.8  CL 104  CO2 26  GLUCOSE 67*  BUN 13  CREATININE 0.79  CALCIUM 9.2    Liver Function Tests:  Recent Labs Lab 04/07/14 1011  AST 20  ALT 19  ALKPHOS 89  BILITOT 0.5  PROT 7.7  ALBUMIN 4.1   No results found for this basename: LIPASE, AMYLASE,  in the last 168 hours No results found for this basename: AMMONIA,  in the last 168 hours CBC:  Recent Labs Lab 04/07/14 1011  WBC 12.0*  NEUTROABS 10.8*  HGB 15.7  HCT 46.3  MCV 90.3  PLT 253   Cardiac Enzymes:  Recent Labs Lab 04/07/14 1011 04/07/14 1130  TROPONINI <0.30 <0.30    BNP (last 3 results) No results found for this basename: PROBNP,  in the last 8760 hours CBG: No results found for this basename: GLUCAP,  in the last 168 hours  Radiological Exams on Admission: Dg Chest 2 View  04/07/2014   CLINICAL DATA:  Syncopal episode and nausea ; history of previous esophagectomy and CABG  EXAM: CHEST  2 VIEW  COMPARISON:  Chest x-ray dated March 15, 2014  FINDINGS: The right lung is well-expanded and clear. On the left the lung is well-expanded but there are coarse interstitial markings in the mid and lower lung zone which are somewhat more conspicuous than in the past. The heart is normal in size. The pulmonary vascularity is not engorged. The patient has undergone previous median sternotomy. There is no pleural effusion. The bony thorax is unremarkable.  IMPRESSION: COPD. Increased interstitial markings in the left mid and lower lung zone may be in part due to the patient positioning but subsegmental atelectasis or interstitial pneumonia superimposed upon chronic fibrotic change is suspected.   Electronically Signed   By: David  Martinique   On: 04/07/2014 11:32   Ct Head Wo Contrast  04/07/2014   CLINICAL DATA:  Syncopal episode.  EXAM: CT HEAD WITHOUT CONTRAST  TECHNIQUE: Contiguous axial images were obtained from the base of the skull through the vertex without intravenous contrast.  COMPARISON:  01/25/2004 ; brain MRI - 02/04/2004  FINDINGS: Rather extensive periventricular hypodensities have  progressed in the interval, right greater than left. Given extensive background parenchymal abnormalities, there is no CT evidence acute large territory infarct. No intraparenchymal or extra-axial mass or hemorrhage. Unchanged size and configuration of the ventricles and basilar cisterns. No midline shift. Limited visualization of the paranasal sinuses and  mastoid air cells are normal. No air-fluid level. Intracranial atherosclerosis. Regional soft tissues appear normal. No displaced calvarial fracture.  IMPRESSION: Interval progression of microvascular ischemic disease without definite acute intracranial process.   Electronically Signed   By: Sandi Mariscal M.D.   On: 04/07/2014 11:20    EKG: Independently reviewed. As above in history present illness.  Assessment/Plan Principal Problem:   Acute dyspnea Active Problems:   Esophageal carcinoma   CAP (community acquired pneumonia)   Hypoglycemia   Near syncope   PVD   GERD   CAD   CAROTID BRUIT, RIGHT   1. Acute dyspnea. He has COPD, but he has only rare wheezes on exam His chest x-ray reveals findings consistent with possible pneumonia. Compared to his chest x-ray approximately 3 weeks ago, there is an increase in interstitial markings in the left mid and lower lung zone. This could be construed as pneumonia. His white blood cell count is modestly elevated. Given his history of esophageal carcinoma, CT angiogram of his chest will be ordered to rule out PE or other concerning findings. We'll order a pro BNP. He was given Rocephin and azithromycin in the ED. These will be continued. Would add oxygen when necessary, as needed antitussive medications, and Xopenex nebulizer every 8 hours. He is currently afebrile and we'll hold off on ordering blood cultures unless he becomes febrile. 2. Near syncope. The etiology could be multifactorial including hypoglycemia, orthostasis, pneumonia, or presumed carotid artery disease. CT of his head reveals chronic  microvascular disease. History of with both Plavix and aspirin chronically for CAD and PVD. These will be continued. We'll order a carotid ultrasound, cardiac enzymes, TSH, and vitamin B12 for further evaluation. Will ask for orthostatic vital signs. We'll hydrate gently. 3. Hypoglycemia. The patient has no history of diabetes mellitus. Given his esophagectomy, it is likely that he has the sequelae of dumping syndrome. He was told by his surgeon to eat 6 small meals daily primarily because of his chronic regurgitation and gastric acid reflux. Wife says that he has not been compliant with this. He often skips meals as well. Will start IV fluids with dextrose. We'll monitor his CBGs every 4 hours to assess for ongoing hypoglycemia. We'll consult the registered dietitian to assist with his diet or prescribed an antibiotic diet. 4. History of esophageal cancer with gastric pull through and GERD. Status post esophagectomy and 2010. He has chronic regurgitation and acid reflux. He is treated with Protonix chronically. He was recently started on Carafate by his PCP for his symptoms. 5. CAD and PVD. He has a rather extensive history of both. He was evaluated by Dr. Claiborne Billings in January 2015 with a Myoview stress test and it was essentially normal. He is status post CABG in 1998 and status post femoral-popliteal bypass graft in 1993. He has a known right carotid bruit and likely has carotid artery disease. He appears to be on excellent medication management. For further evaluation, we'll order cardiac enzymes and a carotid ultrasound.  6. Hypertension. His blood pressure is currently low-normal. He is a chronically with Cozaar, amlodipine, and Toprol-XL. We'll decrease the dose of Cozaar and Toprol-XL in the setting of low-normal blood pressures. We'll titrate back up accordingly. We'll provide gentle IV fluids.    Code Status: full code DVT Prophylaxis: subcutaneous heparin  Family Communication: discussed with his  wife and daughter  Disposition Plan: discharge when medically appropriate in the next 24-48 hours.   Time spent: 1 hour and 10 minutes.  [facility] Pager [phone]

## 2014-04-08 ENCOUNTER — Encounter (HOSPITAL_COMMUNITY): Payer: Self-pay | Admitting: Internal Medicine

## 2014-04-08 ENCOUNTER — Inpatient Hospital Stay (HOSPITAL_COMMUNITY): Payer: Medicare Other

## 2014-04-08 DIAGNOSIS — I6529 Occlusion and stenosis of unspecified carotid artery: Secondary | ICD-10-CM

## 2014-04-08 DIAGNOSIS — I6522 Occlusion and stenosis of left carotid artery: Secondary | ICD-10-CM

## 2014-04-08 DIAGNOSIS — R911 Solitary pulmonary nodule: Secondary | ICD-10-CM | POA: Diagnosis present

## 2014-04-08 DIAGNOSIS — E162 Hypoglycemia, unspecified: Secondary | ICD-10-CM

## 2014-04-08 HISTORY — DX: Solitary pulmonary nodule: R91.1

## 2014-04-08 HISTORY — DX: Occlusion and stenosis of left carotid artery: I65.22

## 2014-04-08 LAB — GLUCOSE, CAPILLARY
GLUCOSE-CAPILLARY: 116 mg/dL — AB (ref 70–99)
GLUCOSE-CAPILLARY: 102 mg/dL — AB (ref 70–99)
GLUCOSE-CAPILLARY: 110 mg/dL — AB (ref 70–99)
Glucose-Capillary: 128 mg/dL — ABNORMAL HIGH (ref 70–99)
Glucose-Capillary: 124 mg/dL — ABNORMAL HIGH (ref 70–99)

## 2014-04-08 LAB — CBC
HCT: 37.5 % — ABNORMAL LOW (ref 39.0–52.0)
HEMOGLOBIN: 12.9 g/dL — AB (ref 13.0–17.0)
MCH: 30.8 pg (ref 26.0–34.0)
MCHC: 34.4 g/dL (ref 30.0–36.0)
MCV: 89.5 fL (ref 78.0–100.0)
Platelets: 198 10*3/uL (ref 150–400)
RBC: 4.19 MIL/uL — ABNORMAL LOW (ref 4.22–5.81)
RDW: 13.4 % (ref 11.5–15.5)
WBC: 18.1 10*3/uL — ABNORMAL HIGH (ref 4.0–10.5)

## 2014-04-08 LAB — BASIC METABOLIC PANEL
Anion gap: 11 (ref 5–15)
BUN: 10 mg/dL (ref 6–23)
CO2: 23 mEq/L (ref 19–32)
CREATININE: 0.82 mg/dL (ref 0.50–1.35)
Calcium: 8.5 mg/dL (ref 8.4–10.5)
Chloride: 103 mEq/L (ref 96–112)
GFR calc non Af Amer: 88 mL/min — ABNORMAL LOW (ref >90)
Glucose, Bld: 132 mg/dL — ABNORMAL HIGH (ref 70–99)
Potassium: 4 mEq/L (ref 3.7–5.3)
Sodium: 137 mEq/L (ref 137–147)

## 2014-04-08 LAB — TROPONIN I: Troponin I: <0.3 ng/mL (ref <0.30)

## 2014-04-08 MED ORDER — POTASSIUM CHLORIDE IN NACL 20-0.9 MEQ/L-% IV SOLN
INTRAVENOUS | Status: DC
  Administered 2014-04-08 – 2014-04-09 (×2): via INTRAVENOUS

## 2014-04-08 MED ORDER — METOPROLOL SUCCINATE ER 25 MG PO TB24
12.5000 mg | ORAL_TABLET | Freq: Every day | ORAL | Status: DC
  Administered 2014-04-08 – 2014-04-09 (×2): 12.5 mg via ORAL
  Filled 2014-04-08 (×2): qty 1

## 2014-04-08 MED ORDER — LOSARTAN POTASSIUM 50 MG PO TABS
25.0000 mg | ORAL_TABLET | Freq: Every day | ORAL | Status: DC
  Administered 2014-04-08 – 2014-04-09 (×2): 25 mg via ORAL
  Filled 2014-04-08: qty 1

## 2014-04-08 NOTE — Progress Notes (Signed)
UR chart review completed.  

## 2014-04-08 NOTE — Progress Notes (Signed)
Inpatient Diabetes Program Recommendations  AACE/ADA: New Consensus Statement on Inpatient Glycemic Control (2013)  Target Ranges:  Prepandial:   less than 140 mg/dL      Peak postprandial:   less than 180 mg/dL (1-2 hours)      Critically ill patients:  140 - 180 mg/dL   Reason for Assessment: A1C is 6.5  Diabetes history: None noted Outpatient Diabetes medications: N/A Current orders for Inpatient glycemic control: None  Note:  Request MD address A1C of 6.5.  Thank you.  Kaziah Krizek S. Marcelline Mates, RN, CNS, CDE Inpatient Diabetes Program, team pager [phone]

## 2014-04-08 NOTE — Progress Notes (Signed)
[facility] PROGRESS NOTE  Gregory Eaton:[id number] DOB: 13-Jul-1946 DOA: 04/07/2014 PCP: Purvis Kilts, MD Oncology at [facility] Cardiologist: [place], M.D.   Code Status: Full code Family Communication: Discussed with wife and son Disposition Plan: Discharge to home in the next 24-48 hours   Consultants:  None  Procedures:  None  Antibiotics:  Rocephin and 9/20>  Azithromycin 9/20>  HPI/Subjective: The patient is sitting up in a chair. He says that he is feeling better. He has a mild cough, but nonproductive. He denies pleurisy. He denies dizziness or feeling lightheaded.  Objective: Filed Vitals:   04/08/14 1010  BP: 125/59  Pulse: 57  Temp:   Resp:    temperature 98.1. Pulse 57. Respiratory rate 14. Blood pressure 125/59. Oxygen saturation on room air 93%.  Intake/Output Summary (Last 24 hours) at 04/08/14 1207 Last data filed at 04/08/14 0834  Gross per 24 hour  Intake 373.75 ml  Output   1600 ml  Net -1226.25 ml   Filed Weights   04/07/14 0950 04/07/14 1550 04/08/14 0700  Weight: 72.576 kg (160 lb) 71.3 kg (157 lb 3 oz) 71.2 kg (156 lb 15.5 oz)    Exam:   General:  Pleasant alert 68 year old man in no acute distress.  Cardiovascular: S1, S2, with a soft systolic murmur her and borderline bradycardia.  Respiratory: Rare crackles on the left; breathing nonlabored.  Abdomen: Positive bowel sounds, soft, nontender, nondistended.  Musculoskeletal: No acute hot red joints. No pedal edema.       Neurologic: He is alert and oriented x3. Cranial nerves II through XII are intact. Gait is within normal limits.  Data Reviewed: Basic Metabolic Panel:  Recent Labs Lab 04/07/14 1011 04/08/14 0329  NA 142 137  K 3.8 4.0  CL 104 103  CO2 26 23  GLUCOSE 67* 132*  BUN 13 10  CREATININE 0.79 0.82  CALCIUM 9.2 8.5   Liver Function Tests:  Recent Labs Lab 04/07/14 1011  AST 20  ALT 19  ALKPHOS 89   BILITOT 0.5  PROT 7.7  ALBUMIN 4.1   No results found for this basename: LIPASE, AMYLASE,  in the last 168 hours No results found for this basename: AMMONIA,  in the last 168 hours CBC:  Recent Labs Lab 04/07/14 1011 04/08/14 0329  WBC 12.0* 18.1*  NEUTROABS 10.8*  --   HGB 15.7 12.9*  HCT 46.3 37.5*  MCV 90.3 89.5  PLT 253 198   Cardiac Enzymes:  Recent Labs Lab 04/07/14 1011 04/07/14 1130 04/07/14 1600 04/07/14 2145 04/08/14 0329  TROPONINI <0.30 <0.30 <0.30 <0.30 <0.30   BNP (last 3 results)  Recent Labs  04/07/14 1600  PROBNP 190.6*   CBG:  Recent Labs Lab 04/07/14 1625 04/08/14 0112 04/08/14 0731 04/08/14 1136  GLUCAP 101* 128* 124* 116*    No results found for this or any previous visit (from the past 240 hour(s)).   Studies: Dg Chest 2 View  04/07/2014   CLINICAL DATA:  Syncopal episode and nausea ; history of previous esophagectomy and CABG  EXAM: CHEST  2 VIEW  COMPARISON:  Chest x-ray dated March 15, 2014  FINDINGS: The right lung is well-expanded and clear. On the left the lung is well-expanded but there are coarse interstitial markings in the mid and lower lung zone which are somewhat more conspicuous than in the past. The heart is normal in size. The pulmonary vascularity is not engorged. The patient has undergone previous median  sternotomy. There is no pleural effusion. The bony thorax is unremarkable.  IMPRESSION: COPD. Increased interstitial markings in the left mid and lower lung zone may be in part due to the patient positioning but subsegmental atelectasis or interstitial pneumonia superimposed upon chronic fibrotic change is suspected.   Electronically Signed   By: David  Martinique   On: 04/07/2014 11:32   Ct Head Wo Contrast  04/07/2014   CLINICAL DATA:  Syncopal episode.  EXAM: CT HEAD WITHOUT CONTRAST  TECHNIQUE: Contiguous axial images were obtained from the base of the skull through the vertex without intravenous contrast.  COMPARISON:   01/25/2004 ; brain MRI - 02/04/2004  FINDINGS: Rather extensive periventricular hypodensities have progressed in the interval, right greater than left. Given extensive background parenchymal abnormalities, there is no CT evidence acute large territory infarct. No intraparenchymal or extra-axial mass or hemorrhage. Unchanged size and configuration of the ventricles and basilar cisterns. No midline shift. Limited visualization of the paranasal sinuses and mastoid air cells are normal. No air-fluid level. Intracranial atherosclerosis. Regional soft tissues appear normal. No displaced calvarial fracture.  IMPRESSION: Interval progression of microvascular ischemic disease without definite acute intracranial process.   Electronically Signed   By: Sandi Mariscal M.D.   On: 04/07/2014 11:20   Ct Angio Chest Pe W/cm &/or Wo Cm  04/07/2014   CLINICAL DATA:  Shortness of breath. Prior history of esophageal cancer, post esophagectomy and gastric pull-through in 2010.  EXAM: CT ANGIOGRAPHY CHEST WITH CONTRAST  TECHNIQUE: Multidetector CT imaging of the chest was performed using the standard protocol during bolus administration of intravenous contrast. Multiplanar CT image reconstructions and MIPs were obtained to evaluate the vascular anatomy.  CONTRAST:  16mL OMNIPAQUE IOHEXOL 350 MG/ML IV.  COMPARISON:  CTA chest 01/24/2009.  FINDINGS: Contrast opacification of the pulmonary arteries is good. No filling defects within either main pulmonary artery or their branches in either lung to suggest pulmonary embolism. Prior sternotomy CABG. Cardiac silhouette upper normal in size. No pericardial effusion. As best I can tell on this nongated study, the coronary grafts appear patent. Severe atherosclerosis involving the thoracic and upper abdominal aorta without aneurysm or dissection.  Severe emphysematous changes throughout both lungs. Airspace consolidation involving the left upper lobe including the lingula and minimally in the  central left lower lobe. Approximate 8 mm noncalcified nodule in the peripheral right lower lobe (series 5, image 55). No pulmonary parenchymal nodules or masses elsewhere. No pleural effusions.  Post surgical changes related to prior esophagectomy and gastric pull through without complicating features. Normal-sized mediastinal and bilateral hilar lymph nodes; no significant lymphadenopathy. Thyroid gland atrophic and otherwise unremarkable.  Very small cysts in the visualized liver. Foci of accessory splenic tissue medial to the upper pole of the spleen. Calcification anteriorly in the omentum of the upper abdomen, likely dystrophic and related to fat necrosis at the time of the prior surgery. Celiac, SMA and bilateral renal arteries patent though atherosclerotic Bone window images demonstrate diffuse thoracic spondylosis.  Review of the MIP images confirms the above findings.  IMPRESSION: 1. No evidence of pulmonary embolism. 2. 8 mm noncalcified nodule in the right lower lobe. This is at the lower limits of detectability by PET. Therefore, follow-up CT chest in 3-6 months is recommended. 3. Pneumonia involving the left upper lobe and the central left lower lobe superimposed upon severe COPD/emphysema. 4. Esophagectomy and gastric pull-through without complicating features. 5. Benign upper abdominal findings as above.   Electronically Signed   By: Marcello Moores  Lawrence M.D.   On: 04/07/2014 15:48   US Carotid Bilateral  04/08/2014   CLINICAL DATA:  Near syncopal of the  EXAM: BILATERAL CAROTID DUPLEX ULTRASOUND  TECHNIQUE: Pearline Cables scale imaging, color Doppler and duplex ultrasound were performed of bilateral carotid and vertebral arteries in the neck.  COMPARISON:  None.  FINDINGS: Criteria: Quantification of carotid stenosis is based on velocity parameters that correlate the residual internal carotid diameter with NASCET-based stenosis levels, using the diameter of the distal internal carotid lumen as the denominator  for stenosis measurement.  The following velocity measurements were obtained:  RIGHT  ICA:  93/14 cm/sec  CCA:  413/24 cm/sec  SYSTOLIC ICA/CCA RATIO:  4.01  DIASTOLIC ICA/CCA RATIO:  0.27  ECA:  196 cm/sec  LEFT  ICA:  171/33 cm/sec  CCA:  Its window 2/53 cm/sec  SYSTOLIC ICA/CCA RATIO:  6.64  DIASTOLIC ICA/CCA RATIO:  4.03  ECA:  36 cm/sec  RIGHT CAROTID ARTERY: Atherosclerotic plaque is noted within the carotid bulb and proximal internal carotid artery. The waveforms, velocities and flow velocity ratios however demonstrate no evidence of focal hemodynamically significant stenosis.  RIGHT VERTEBRAL ARTERY:  Antegrade in nature.  LEFT CAROTID ARTERY: Atherosclerotic plaque is noted bilaterally. The waveforms, velocities and flow velocity ratios suggest a stenosis in the 50-69% range in the left internal carotid artery. Additionally pre occlusive stenosis is noted in the left external carotid artery on the grayscale and color-flow images.  LEFT VERTEBRAL ARTERY:  Not visualized  IMPRESSION: Bilateral atherosclerotic plaque in the carotid bulbs and internal carotid arteries. Focal 50-69% stenosis is noted in the left internal carotid artery.  Pre occlusive narrowing within the left external carotid artery.   Electronically Signed   By: Inez Catalina M.D.   On: 04/08/2014 10:06    Scheduled Meds: . aspirin EC  81 mg Oral Daily  . atorvastatin  20 mg Oral q1800  . azithromycin  500 mg Intravenous Q24H  . cefTRIAXone (ROCEPHIN)  IV  1 g Intravenous Q24H  . clopidogrel  75 mg Oral Daily  . docusate sodium  100 mg Oral BID  . heparin  5,000 Units Subcutaneous 3 times per day  . latanoprost  1 drop Both Eyes QHS  . levalbuterol  0.63 mg Nebulization Q8H  . losartan  25 mg Oral Daily  . metoprolol succinate  12.5 mg Oral Daily  . pantoprazole  40 mg Oral BID  . sucralfate  1 g Oral TID WC & HS  . timolol  1 drop Both Eyes Daily   Continuous Infusions: . 0.9 % NaCl with KCl 20 mEq / L      Assessment  and plan: Principal Problem:   Acute dyspnea Active Problems:   Esophageal carcinoma   CAP (community acquired pneumonia)   Hypoglycemia   Near syncope   PVD   GERD   COPD with emphysema   CAD   CAROTID BRUIT, RIGHT   Left carotid artery stenosis   Pulmonary nodule, right   1. Community-acquired pneumonia. CT angiogram of the chest revealed no evidence of pulmonary embolism. He did reveal left upper lobe and central lower lobe pneumonia superimposed on severe COPD/emphysema. He appears to be improving clinically. He is afebrile. However, his white blood cell count did increase which is puzzling. We will continue current antibiotic therapy and supportive treatment.  Severe COPD with emphysema. This was seen radiographically. No evidence of bronchospasms or wheezing on exam. Will continue Xopenex nebulizers.  Very small 2.8 mm right  lung nodule. Given his history of esophageal cancer, he will need a surveillance CT of the chest in 3-6 months.  CAD-status post CABG in 1998 an Hypertension. He denies chest pain. His Myoview stress test in January 2015 was essentially normal. His blood pressures are low-normal, so the dosing of Cozaar and Toprol have been decreased. Norvasc is being held. Otherwise, continue antiplatelet and statin therapy.  Esophageal carcinoma, status post esophagectomy and gastric pull-through and 2010. There were no complicating features per radiology. Continue PPI and Carafate.  Hypoglycemia. This was thought to be attributed to poor oral intake and/or dumping syndrome. He had no prior history of diabetes mellitus. However, his hemoglobin A1c is 6.5 indicating borderline diabetes. (Did not discuss with patient yet). Dextrose and IV fluids have been discontinued. We'll continue to monitor his CBGs.  Near syncope. This is likely from pneumonia. Also with contributions from volume depletion and hypoglycemia. Symptoms are currently resolved. His TSH was within normal  limits at 1.8.  Left carotid artery stenosis. The patient has a known left carotid bruit. Carotid ultrasound reveals focal 50-69% stenosis in the left internal carotid artery and pre-occlusive narrowing within the left external carotid artery. We'll continue medical management. We'll refer the patient back to cardiologist Dr. Claiborne Billings for further management.     Time spent: 35 minutes    [facility] Pager [phone] If 7PM-7AM, please contact night-coverage at [email or web address], password [facility] 04/08/2014, 12:07 PM  LOS: 1 day

## 2014-04-08 NOTE — Care Management Note (Addendum)
    Page 1 of 1   04/09/2014     2:44:34 PM CARE MANAGEMENT NOTE 04/09/2014  Patient:  Gregory Eaton, Gregory Eaton   Account Number:  [id number]  Date Initiated:  04/08/2014  Documentation initiated by:  Theophilus Kinds  Subjective/Objective Assessment:   Pt admitted from home with pneumonia. Pt lives with his wife and will return home at discharge. Pt is independent with ADL's.     Action/Plan:   No CM needs noted.   Anticipated DC Date:  04/10/2014   Anticipated DC Plan:  Micro  CM consult      Choice offered to / List presented to:             Status of service:  Completed, signed off Medicare Important Message given?  NA - LOS <3 / Initial given by admissions (If response is "NO", the following Medicare IM given date fields will be blank) Date Medicare IM given:   Medicare IM given by:   Date Additional Medicare IM given:   Additional Medicare IM given by:    Discharge Disposition:  HOME/SELF CARE  Per UR Regulation:    If discussed at Long Length of Stay Meetings, dates discussed:    Comments:  04/09/14 [place], RN BSN CM Pt discharged home today. No CM needs noted.  04/08/14 [place], RN BSN CM

## 2014-04-08 NOTE — Progress Notes (Signed)
Nutrition Brief Note  Patient identified through consult for diet education. Anti-dumping. Diet recommendations handout provided and reviewed. His wife is present and supportive.  Wt Readings from Last 15 Encounters:  04/08/14 156 lb 15.5 oz (71.2 kg)  03/15/14 160 lb (72.576 kg)  08/13/13 164 lb 1.6 oz (74.435 kg)  07/24/13 158 lb (71.668 kg)  01/29/13 158 lb 11.2 oz (71.986 kg)  11/03/10 165 lb 12.8 oz (75.206 kg)  03/11/09 176 lb (79.833 kg)   Pt says he had severe weight loss following his surgery for esophageal carcinoma in 2010 but now his weight has been stable past several years between 157-165# and says he wants to maintain wt in this range.   Body mass index is 23.87 kg/(m^2). Patient meets criteria for normal  based on current BMI.   Current diet order is Heart Healthy, patient is consuming approximately 100% of meals at this time. He says he doesn't get hungry but eats anyway because he needs to. Takes MVI daily. He understands recommendations of eating small frequent meals but has a difficulty meeting goal due to lack of appetite.  Labs and medications reviewed. Will continue to follow for nutrition care.  Colman Cater MS,RD,CSG,LDN Office: [phone] Pager: [phone]

## 2014-04-09 ENCOUNTER — Encounter (HOSPITAL_COMMUNITY): Payer: Self-pay | Admitting: Internal Medicine

## 2014-04-09 DIAGNOSIS — J438 Other emphysema: Secondary | ICD-10-CM

## 2014-04-09 DIAGNOSIS — E119 Type 2 diabetes mellitus without complications: Secondary | ICD-10-CM | POA: Diagnosis present

## 2014-04-09 LAB — CBC
HEMATOCRIT: 40.9 % (ref 39.0–52.0)
Hemoglobin: 14 g/dL (ref 13.0–17.0)
MCH: 31 pg (ref 26.0–34.0)
MCHC: 34.2 g/dL (ref 30.0–36.0)
MCV: 90.5 fL (ref 78.0–100.0)
Platelets: 227 10*3/uL (ref 150–400)
RBC: 4.52 MIL/uL (ref 4.22–5.81)
RDW: 13.6 % (ref 11.5–15.5)
WBC: 12.4 10*3/uL — AB (ref 4.0–10.5)

## 2014-04-09 LAB — BASIC METABOLIC PANEL
ANION GAP: 11 (ref 5–15)
BUN: 11 mg/dL (ref 6–23)
CO2: 24 mEq/L (ref 19–32)
Calcium: 8.9 mg/dL (ref 8.4–10.5)
Chloride: 103 mEq/L (ref 96–112)
Creatinine, Ser: 0.88 mg/dL (ref 0.50–1.35)
GFR, EST NON AFRICAN AMERICAN: 86 mL/min — AB (ref >90)
Glucose, Bld: 148 mg/dL — ABNORMAL HIGH (ref 70–99)
POTASSIUM: 3.8 meq/L (ref 3.7–5.3)
Sodium: 138 mEq/L (ref 137–147)

## 2014-04-09 LAB — GLUCOSE, CAPILLARY
GLUCOSE-CAPILLARY: 103 mg/dL — AB (ref 70–99)
GLUCOSE-CAPILLARY: 109 mg/dL — AB (ref 70–99)
Glucose-Capillary: 110 mg/dL — ABNORMAL HIGH (ref 70–99)

## 2014-04-09 MED ORDER — LOSARTAN POTASSIUM 100 MG PO TABS: 50.0000 mg | ORAL_TABLET | Freq: Every day | ORAL | Status: DC

## 2014-04-09 MED ORDER — METOPROLOL SUCCINATE ER 50 MG PO TB24: 50.0000 mg | ORAL_TABLET | Freq: Every day | ORAL | Status: DC

## 2014-04-09 MED ORDER — CEFUROXIME AXETIL 500 MG PO TABS: 500.0000 mg | ORAL_TABLET | Freq: Two times a day (BID) | ORAL | Status: DC

## 2014-04-09 NOTE — Plan of Care (Signed)
Problem: Discharge Progression Outcomes Goal: Activity appropriate for discharge plan Outcome: Completed/Met Date Met:  04/09/14 Patient ambulated in hallway without difficulty.     

## 2014-04-09 NOTE — Progress Notes (Signed)
Patient discharged home with instructions given on medications,and follow up visits,patient verbalized understanding.Prescriptions sent with patient home.Noc/o pain or discomfort noted.Accompanied by staff to an awaiting vehicle.

## 2014-04-09 NOTE — H&P (Deleted)
Physician Discharge Summary  [name] ERD:[id number] DOB: Oct 12, 1945 DOA: 04/07/2014  PCP: Purvis Kilts, MD  Admit date: 04/07/2014 Discharge date: 04/09/2014  Time spent: Greater than 30 minutes  Recommendations for Outpatient Follow-up:  1. Recommend outpatient monitoring of patient's blood glucose to decide on starting medication treatment.  2. Recommend follow up of his blood pressure as the dosing of Toprol XL and Cozaar dosing was decreased by half because of low-normal blood pressures. 3. Recommend follow up CT chest in 6 months to monitor pulmonary nodule.  Discharge Diagnoses:  1.CAP (community acquired pneumonia). 2. Hypoglycemia secondary to poor oral intake and possible dumping syndrome. 3. Newly diagnosed type 2 diabetes mellitus. HbA1c 6.5. 4.COPD with emphysema. 5.Left carotid artery stenosis; chronic; 50-69% stenosis. 6.Pulmonary nodule, right. 7. Near syncope secondary to # 1 and #2 8. Hx Esophageal carcinoma; s/p esophagectomy and gastric pull-through 2010. 9.GERD. 10. CAD and PVD.    Discharge Condition: Improved  Diet recommendation: Carb-modified; heart heathy  Filed Weights   04/07/14 1550 04/08/14 0700 04/09/14 0414  Weight: 71.3 kg (157 lb 3 oz) 71.2 kg (156 lb 15.5 oz) 71.4 kg (157 lb 6.5 oz)    History of present illness:    Gregory Eaton is a 68 y.o. male with an extensive history including esophageal carcinoma-status post esophagectomy/gastric pull through in 2010, coronary artery disease-status post CABG in 1998, peripheral vascular disease-status post femoropopliteal bypass graft in 1993, and COPD. He presented to the ED with a chief complaint of shortness of breath and feeling faint. The patient was in his usual state of health and "feeling good" when he got up. However, shortly thereafter he began to feel sick. He became nauseated and had dry heaves several times, coughing up white foam. He also felt acutely short of breath as if his  complete breath was taken away. He denied outright chest pain or pleurisy, but he said that he had central chest pressure from chronic regurgitation and acid reflux associated. He has central chest discomfort chronically from his esophageal surgery. He was recently started on Carafate for this by his PCP. He had chills, but no subjective fever.  He denied headache, spinning dizziness, abdominal pain, dysuria, or diarrhea. His wife did not see a facial droop or focal unilateral weakness. He had no history of diabetes. He does skips meals and is suppose to eat 6 small meals daily, but he doesn't.  In the ED, he was afebrile and hemodynamically stable. His WBC was elevated at 12.0. His troponin I was negative x2. His venous glucose was low at 67.CT of his head revealed interval progression of mitral vascular ischemic disease without definite acute intracranial process. His chest x-ray revealed COPD, increased interstitial markings in the left mid and lower lung zone-subsegmental atelectasis or interstitial pneumonia superimposed on chronic fibrotic change suspected-new changes compared to the chest x-ray 03/15/14. His EKG revealed normal sinus rhythm with a heart rate of 98 beats per minute and right bundle branch block. His urinalysis was unremarkable. He was admitted for further evaluation and management.     Hospital Course:   1. Community-acquired pneumonia.  The patient was started on Rocephin and Azithromycin, Xopenex nebulizer and gentle IV fluids.CT angiogram was ordered to rule out pulmonary embolism in the setting of his history of esophageal cancer. There was no evidence of pulmonary embolism. It did reveal left upper lobe and central lower lobe pneumonia superimposed on severe COPD/emphysema. With treatment he improved clinically. He was afebrile. However, his white  blood cell count did increase, which was puzzling but then the next day it decreased substantially. The increase was likely a lab error.  He became asymptomatic. He ambulated several times on room air and his 02 saturations were in the mid 90s. He was discharged on 5 more days of Ceftin.  Severe COPD with emphysema.  This was seen radiographically. No significant evidence of bronchospasms or wheezing on exam.  Xopenex nebulizer was given empirically.  Very small 2.8 mm right lung nodule.  CT of his chest revealed this tiny nodule.Given his history of esophageal cancer, he will need a surveillance CT of the chest in 6 months.  CAD-status post CABG in 1998 and Hypertension.  He denied chest pain. His Myoview stress test in January 2015 was essentially normal. His blood pressures were low-normal, so the dosing of Cozaar and Toprol were decreased. Norvasc was held but restarted. Otherwise, he was continued on antiplatelet and statin therapy. Patient was discharged on lower doses of Cozaar and Toprol, so his blood pressure will need to be monitored and readjustments made if needed. Esophageal carcinoma, status post esophagectomy and gastric pull-through and 2010.  There were no complicating features per radiology. He was continued on PPI and Carafate.  Hypoglycemia and Type 2 diabetes mellitus   His low blood sugar was thought to be attributed to poor oral intake and/or dumping syndrome. He had no prior history of diabetes mellitus. However, his hemoglobin A1c was 6.5 indicating borderline diabetes. He was treated with gentle IV fluids with dextrose overnight; once discontinued, his blood glucose ranged from 100-120. Patient was instructed on an anti-dumping carb-modified diet. He was advised to avoid skipping meals. Medication treatment was not started, but he will need to be monitored in the outpatient setting. Near syncope.  This was likely from pneumonia. Also with contributions from volume depletion and hypoglycemia. Symptoms resolved. His TSH was within normal limits at 1.8.  Left carotid artery stenosis.  The patient has a known right  carotid bruit and left ICA plaque. His carotid ultrasound revealed focal 50-69% stenosis in the left internal carotid artery and pre-occlusive narrowing within the left external carotid artery. No significant stenosis on the right. He was continued on medical management. He was instructed to follow up with cardiologist Dr. Claiborne Billings for further management.      Procedures:  none  Consultations:  none  Discharge Exam: Filed Vitals:   04/09/14 1429  BP: 112/93  Pulse: 72  Temp: 98.6 F (37 C)  Resp: 16    General: Pleasant alert 68 year old man in no acute distress.  Cardiovascular: S1, S2, with a soft systolic murmur her and borderline bradycardia.  Respiratory: Now CTA; breathing nonlabored.  Abdomen: Positive bowel sounds, soft, nontender, nondistended.  Musculoskeletal: No acute hot red joints. No pedal edema.             Neurologic: He is alert and oriented x3. Cranial nerves II through XII are intact. Gait is within normal limits.   Discharge Instructions You were cared for by a hospitalist during your hospital stay. If you have any questions about your discharge medications or the care you received while you were in the hospital after you are discharged, you can call the unit and asked to speak with the hospitalist on call if the hospitalist that took care of you is not available. Once you are discharged, your primary care physician will handle any further medical issues. Please note that NO REFILLS for any discharge medications will be authorized  once you are discharged, as it is imperative that you return to your primary care physician (or establish a relationship with a primary care physician if you do not have one) for your aftercare needs so that they can reassess your need for medications and monitor your lab values.  Discharge Instructions   Diet - low sodium heart healthy    Complete by:  As directed      Diet Carb Modified    Complete by:  As directed      Discharge  instructions    Complete by:  As directed   1.) STOP CURRENT DOSE OF TOPROL XL AND TAKE NEW DOSE OF 50 MG I TAB DAILY. 2.) TAKE 1/2 TAB OF LOSARTAN DAILY. 3.)Follow up with your cardiologist about your carotid arteries-mild blockage on the left. 4.) Check your blood sugar several times weekly in the morning before eating. Call your doctor if it is above 140-150- consistently     Increase activity slowly    Complete by:  As directed           Current Discharge Medication List    START taking these medications   Details  cefUROXime (CEFTIN) 500 MG tablet Take 1 tablet (500 mg total) by mouth 2 (two) times daily with a meal. Take antibiotic for 5 more days. Qty: 10 tablet, Refills: 0      CONTINUE these medications which have CHANGED   Details  losartan (COZAAR) 100 MG tablet Take 0.5 tablets (50 mg total) by mouth daily.    metoprolol succinate (TOPROL-XL) 50 MG 24 hr tablet Take 1 tablet (50 mg total) by mouth daily. Take with or immediately following a meal. Qty: 30 tablet, Refills: 3      CONTINUE these medications which have NOT CHANGED   Details  ALPRAZolam (XANAX) 0.5 MG tablet Take 0.5 mg by mouth at bedtime as needed for sleep.    amLODipine (NORVASC) 5 MG tablet Take 1 tablet (5 mg total) by mouth daily. Qty: 30 tablet, Refills: 7    aspirin 81 MG EC tablet Take 81 mg by mouth daily.      clopidogrel (PLAVIX) 75 MG tablet Take 75 mg by mouth daily.    latanoprost (XALATAN) 0.005 % ophthalmic solution Place 1 drop into both eyes at bedtime.     Multiple Vitamins-Minerals (CENTRUM SILVER PO) Take 1 tablet by mouth daily.      nitroGLYCERIN (NITROSTAT) 0.4 MG SL tablet Place 1 tablet (0.4 mg total) under the tongue every 5 (five) minutes as needed for chest pain. Qty: 25 tablet, Refills: 3    pantoprazole (PROTONIX) 40 MG tablet Take 40 mg by mouth 2 (two) times daily.    rosuvastatin (CRESTOR) 10 MG tablet Take 1 tablet (10 mg total) by mouth daily. Qty: 90  tablet, Refills: 3    sucralfate (CARAFATE) 1 G tablet Take 1 tablet (1 g total) by mouth 4 (four) times daily -  with meals and at bedtime. Qty: 40 tablet, Refills: 0    timolol (TIMOPTIC) 0.5 % ophthalmic solution Place 1 drop into both eyes daily.     tiZANidine (ZANAFLEX) 4 MG tablet Take 4 mg by mouth 3 (three) times daily as needed. Muscle spasm       Allergies  Allergen Reactions  . Altace [Ramipril] Cough  . Prednisone Other (See Comments)    "throat broke out"    Follow-up Information   Schedule an appointment as soon as possible for a visit with Sharilyn Sites  CABOT, MD. (To be seen in 1-2 weeks.)    Specialty:  Family Medicine   Contact information:   [address] [place] [address] [phone]       Schedule an appointment as soon as possible for a visit with Shelva Majestic A, MD. (To be seen in 3-4 weeks.)    Specialty:  Cardiology   Contact information:   [address] [place] [address] [phone]        The results of significant diagnostics from this hospitalization (including imaging, microbiology, ancillary and laboratory) are listed below for reference.    Significant Diagnostic Studies: Dg Chest 2 View  04/07/2014   CLINICAL DATA:  Syncopal episode and nausea ; history of previous esophagectomy and CABG  EXAM: CHEST  2 VIEW  COMPARISON:  Chest x-ray dated March 15, 2014  FINDINGS: The right lung is well-expanded and clear. On the left the lung is well-expanded but there are coarse interstitial markings in the mid and lower lung zone which are somewhat more conspicuous than in the past. The heart is normal in size. The pulmonary vascularity is not engorged. The patient has undergone previous median sternotomy. There is no pleural effusion. The bony thorax is unremarkable.  IMPRESSION: COPD. Increased interstitial markings in the left mid and lower lung zone may be in part due to the patient positioning but subsegmental  atelectasis or interstitial pneumonia superimposed upon chronic fibrotic change is suspected.   Electronically Signed   By: David  Martinique   On: 04/07/2014 11:32   Ct Head Wo Contrast  04/07/2014   CLINICAL DATA:  Syncopal episode.  EXAM: CT HEAD WITHOUT CONTRAST  TECHNIQUE: Contiguous axial images were obtained from the base of the skull through the vertex without intravenous contrast.  COMPARISON:  01/25/2004 ; brain MRI - 02/04/2004  FINDINGS: Rather extensive periventricular hypodensities have progressed in the interval, right greater than left. Given extensive background parenchymal abnormalities, there is no CT evidence acute large territory infarct. No intraparenchymal or extra-axial mass or hemorrhage. Unchanged size and configuration of the ventricles and basilar cisterns. No midline shift. Limited visualization of the paranasal sinuses and mastoid air cells are normal. No air-fluid level. Intracranial atherosclerosis. Regional soft tissues appear normal. No displaced calvarial fracture.  IMPRESSION: Interval progression of microvascular ischemic disease without definite acute intracranial process.   Electronically Signed   By: Sandi Mariscal M.D.   On: 04/07/2014 11:20   Ct Angio Chest Pe W/cm &/or Wo Cm  04/07/2014   CLINICAL DATA:  Shortness of breath. Prior history of esophageal cancer, post esophagectomy and gastric pull-through in 2010.  EXAM: CT ANGIOGRAPHY CHEST WITH CONTRAST  TECHNIQUE: Multidetector CT imaging of the chest was performed using the standard protocol during bolus administration of intravenous contrast. Multiplanar CT image reconstructions and MIPs were obtained to evaluate the vascular anatomy.  CONTRAST:  133mL OMNIPAQUE IOHEXOL 350 MG/ML IV.  COMPARISON:  CTA chest 01/24/2009.  FINDINGS: Contrast opacification of the pulmonary arteries is good. No filling defects within either main pulmonary artery or their branches in either lung to suggest pulmonary embolism. Prior sternotomy  CABG. Cardiac silhouette upper normal in size. No pericardial effusion. As best I can tell on this nongated study, the coronary grafts appear patent. Severe atherosclerosis involving the thoracic and upper abdominal aorta without aneurysm or dissection.  Severe emphysematous changes throughout both lungs. Airspace consolidation involving the left upper lobe including the lingula and minimally in the central left lower lobe. Approximate 8 mm noncalcified  nodule in the peripheral right lower lobe (series 5, image 55). No pulmonary parenchymal nodules or masses elsewhere. No pleural effusions.  Post surgical changes related to prior esophagectomy and gastric pull through without complicating features. Normal-sized mediastinal and bilateral hilar lymph nodes; no significant lymphadenopathy. Thyroid gland atrophic and otherwise unremarkable.  Very small cysts in the visualized liver. Foci of accessory splenic tissue medial to the upper pole of the spleen. Calcification anteriorly in the omentum of the upper abdomen, likely dystrophic and related to fat necrosis at the time of the prior surgery. Celiac, SMA and bilateral renal arteries patent though atherosclerotic Bone window images demonstrate diffuse thoracic spondylosis.  Review of the MIP images confirms the above findings.  IMPRESSION: 1. No evidence of pulmonary embolism. 2. 8 mm noncalcified nodule in the right lower lobe. This is at the lower limits of detectability by PET. Therefore, follow-up CT chest in 3-6 months is recommended. 3. Pneumonia involving the left upper lobe and the central left lower lobe superimposed upon severe COPD/emphysema. 4. Esophagectomy and gastric pull-through without complicating features. 5. Benign upper abdominal findings as above.   Electronically Signed   By: Evangeline Dakin M.D.   On: 04/07/2014 15:48   US Carotid Bilateral  04/08/2014   CLINICAL DATA:  Near syncopal of the  EXAM: BILATERAL CAROTID DUPLEX ULTRASOUND   TECHNIQUE: Pearline Cables scale imaging, color Doppler and duplex ultrasound were performed of bilateral carotid and vertebral arteries in the neck.  COMPARISON:  None.  FINDINGS: Criteria: Quantification of carotid stenosis is based on velocity parameters that correlate the residual internal carotid diameter with NASCET-based stenosis levels, using the diameter of the distal internal carotid lumen as the denominator for stenosis measurement.  The following velocity measurements were obtained:  RIGHT  ICA:  93/14 cm/sec  CCA:  086/76 cm/sec  SYSTOLIC ICA/CCA RATIO:  1.95  DIASTOLIC ICA/CCA RATIO:  0.93  ECA:  196 cm/sec  LEFT  ICA:  171/33 cm/sec  CCA:  Its window 2/67 cm/sec  SYSTOLIC ICA/CCA RATIO:  1.24  DIASTOLIC ICA/CCA RATIO:  5.80  ECA:  36 cm/sec  RIGHT CAROTID ARTERY: Atherosclerotic plaque is noted within the carotid bulb and proximal internal carotid artery. The waveforms, velocities and flow velocity ratios however demonstrate no evidence of focal hemodynamically significant stenosis.  RIGHT VERTEBRAL ARTERY:  Antegrade in nature.  LEFT CAROTID ARTERY: Atherosclerotic plaque is noted bilaterally. The waveforms, velocities and flow velocity ratios suggest a stenosis in the 50-69% range in the left internal carotid artery. Additionally pre occlusive stenosis is noted in the left external carotid artery on the grayscale and color-flow images.  LEFT VERTEBRAL ARTERY:  Not visualized  IMPRESSION: Bilateral atherosclerotic plaque in the carotid bulbs and internal carotid arteries. Focal 50-69% stenosis is noted in the left internal carotid artery.  Pre occlusive narrowing within the left external carotid artery.   Electronically Signed   By: Inez Catalina M.D.   On: 04/08/2014 10:06   Dg Chest Port 1 View  03/15/2014   CLINICAL DATA:  Chest pain.  EXAM: PORTABLE CHEST - 1 VIEW  COMPARISON:  CT chest 01/24/2009 and chest radiograph 07/26/2007.  FINDINGS: Trachea is midline. Heart size stable. Lungs are clear. No  pleural fluid.  IMPRESSION: No acute findings.   Electronically Signed   By: Lorin Picket M.D.   On: 03/15/2014 17:13    Microbiology: No results found for this or any previous visit (from the past 240 hour(s)).   Labs: Basic Metabolic Panel:  Recent  Labs Lab 04/07/14 1011 04/08/14 0329 04/09/14 1032  NA 142 137 138  K 3.8 4.0 3.8  CL 104 103 103  CO2 26 23 24   GLUCOSE 67* 132* 148*  BUN 13 10 11   CREATININE 0.79 0.82 0.88  CALCIUM 9.2 8.5 8.9   Liver Function Tests:  Recent Labs Lab 04/07/14 1011  AST 20  ALT 19  ALKPHOS 89  BILITOT 0.5  PROT 7.7  ALBUMIN 4.1   No results found for this basename: LIPASE, AMYLASE,  in the last 168 hours No results found for this basename: AMMONIA,  in the last 168 hours CBC:  Recent Labs Lab 04/07/14 1011 04/08/14 0329 04/09/14 1032  WBC 12.0* 18.1* 12.4*  NEUTROABS 10.8*  --   --   HGB 15.7 12.9* 14.0  HCT 46.3 37.5* 40.9  MCV 90.3 89.5 90.5  PLT 253 198 227   Cardiac Enzymes:  Recent Labs Lab 04/07/14 1011 04/07/14 1130 04/07/14 1600 04/07/14 2145 04/08/14 0329  TROPONINI <0.30 <0.30 <0.30 <0.30 <0.30   BNP: BNP (last 3 results)  Recent Labs  04/07/14 1600  PROBNP 190.6*   CBG:  Recent Labs Lab 04/08/14 1644 04/08/14 2142 04/09/14 0408 04/09/14 0735 04/09/14 1119  GLUCAP 102* 110* 103* 109* 110*       Signed:  Feleica Fulmore  [facility] 04/09/2014, 2:57 PM

## 2014-04-10 ENCOUNTER — Encounter (HOSPITAL_COMMUNITY): Payer: Self-pay | Admitting: Internal Medicine

## 2014-04-10 NOTE — Discharge Summary (Signed)
Physician Discharge Summary  [name]:[id number] DOB: Aug 21, 1945 DOA: 04/07/2014  PCP: Purvis Kilts, MD  Admit date: 04/07/2014 Discharge date: 04/10/2014  Time spent: Greater than 30 minutes  Recommendations for Outpatient Follow-up:  1. Recommend outpatient monitoring of patient's blood glucose to decide on starting medication treatment.  2. Recommend follow up of his blood pressure as the dosing of Toprol XL and Cozaar dosing was decreased by half because of low-normal blood pressures. 3. Recommend follow up CT chest in 6 months to monitor pulmonary nodule.  Discharge Diagnoses:  1.CAP (community acquired pneumonia). 2. Hypoglycemia secondary to poor oral intake and possible dumping syndrome. 3. Newly diagnosed type 2 diabetes mellitus. HbA1c 6.5. 4.COPD with emphysema. 5.Left carotid artery stenosis; chronic; 50-69% stenosis. 6.Pulmonary nodule, right. 7. Near syncope secondary to # 1 and #2 8. Hx Esophageal carcinoma; s/p esophagectomy and gastric pull-through 2010. 9.GERD. 10. CAD and PVD.    Discharge Condition: Improved  Diet recommendation: Carb-modified; heart heathy  Filed Weights   04/07/14 1550 04/08/14 0700 04/09/14 0414  Weight: 71.3 kg (157 lb 3 oz) 71.2 kg (156 lb 15.5 oz) 71.4 kg (157 lb 6.5 oz)    History of present illness:    Gregory Eaton is a 68 y.o. male with an extensive history including esophageal carcinoma-status post esophagectomy/gastric pull through in 2010, coronary artery disease-status post CABG in 1998, peripheral vascular disease-status post femoropopliteal bypass graft in 1993, and COPD. He presented to the ED with a chief complaint of shortness of breath and feeling faint. The patient was in his usual state of health and "feeling good" when he got up. However, shortly thereafter he began to feel sick. He became nauseated and had dry heaves several times, coughing up white foam. He also felt acutely short of breath as if his  complete breath was taken away. He denied outright chest pain or pleurisy, but he said that he had central chest pressure from chronic regurgitation and acid reflux associated. He has central chest discomfort chronically from his esophageal surgery. He was recently started on Carafate for this by his PCP. He had chills, but no subjective fever.  He denied headache, spinning dizziness, abdominal pain, dysuria, or diarrhea. His wife did not see a facial droop or focal unilateral weakness. He had no history of diabetes. He does skips meals and is suppose to eat 6 small meals daily, but he doesn't.  In the ED, he was afebrile and hemodynamically stable. His WBC was elevated at 12.0. His troponin I was negative x2. His venous glucose was low at 67.CT of his head revealed interval progression of mitral vascular ischemic disease without definite acute intracranial process. His chest x-ray revealed COPD, increased interstitial markings in the left mid and lower lung zone-subsegmental atelectasis or interstitial pneumonia superimposed on chronic fibrotic change suspected-new changes compared to the chest x-ray 03/15/14. His EKG revealed normal sinus rhythm with a heart rate of 98 beats per minute and right bundle branch block. His urinalysis was unremarkable. He was admitted for further evaluation and management.     Hospital Course:   1. Community-acquired pneumonia.  The patient was started on Rocephin and Azithromycin, Xopenex nebulizer and gentle IV fluids.CT angiogram was ordered to rule out pulmonary embolism in the setting of his history of esophageal cancer. There was no evidence of pulmonary embolism. It did reveal left upper lobe and central lower lobe pneumonia superimposed on severe COPD/emphysema. With treatment he improved clinically. He was afebrile. However, his white  blood cell count did increase, which was puzzling but then the next day it decreased substantially. The increase was likely a lab error.  He became asymptomatic. He ambulated several times on room air and his 02 saturations were in the mid 90s. He was discharged on 5 more days of Ceftin.  Severe COPD with emphysema.  This was seen radiographically. No significant evidence of bronchospasms or wheezing on exam.  Xopenex nebulizer was given empirically.  Very small 2.8 mm right lung nodule.  CT of his chest revealed this tiny nodule.Given his history of esophageal cancer, he will need a surveillance CT of the chest in 6 months.  CAD-status post CABG in 1998 and Hypertension.  He denied chest pain. His Myoview stress test in January 2015 was essentially normal. His blood pressures were low-normal, so the dosing of Cozaar and Toprol were decreased. Norvasc was held but restarted. Otherwise, he was continued on antiplatelet and statin therapy. Patient was discharged on lower doses of Cozaar and Toprol, so his blood pressure will need to be monitored and readjustments made if needed. Esophageal carcinoma, status post esophagectomy and gastric pull-through and 2010.  There were no complicating features per radiology. He was continued on PPI and Carafate.  Hypoglycemia and Type 2 diabetes mellitus   His low blood sugar was thought to be attributed to poor oral intake and/or dumping syndrome. He had no prior history of diabetes mellitus. However, his hemoglobin A1c was 6.5 indicating borderline diabetes. He was treated with gentle IV fluids with dextrose overnight; once discontinued, his blood glucose ranged from 100-120. Patient was instructed on an anti-dumping carb-modified diet. He was advised to avoid skipping meals. Medication treatment was not started, but he will need to be monitored in the outpatient setting. Near syncope.  This was likely from pneumonia. Also with contributions from volume depletion and hypoglycemia. Symptoms resolved. His TSH was within normal limits at 1.8.  Left carotid artery stenosis.  The patient has a known right  carotid bruit and left ICA plaque. His carotid ultrasound revealed focal 50-69% stenosis in the left internal carotid artery and pre-occlusive narrowing within the left external carotid artery. No significant stenosis on the right. He was continued on medical management. He was instructed to follow up with cardiologist Dr. Claiborne Billings for further management.      Procedures:  none  Consultations:  none  Discharge Exam: Filed Vitals:   04/09/14 1429  BP: 112/93  Pulse: 72  Temp: 98.6 F (37 C)  Resp: 16    General: Pleasant alert 68 year old man in no acute distress.  Cardiovascular: S1, S2, with a soft systolic murmur her and borderline bradycardia.  Respiratory: Now CTA; breathing nonlabored.  Abdomen: Positive bowel sounds, soft, nontender, nondistended.  Musculoskeletal: No acute hot red joints. No pedal edema.             Neurologic: He is alert and oriented x3. Cranial nerves II through XII are intact. Gait is within normal limits.   Discharge Instructions You were cared for by a hospitalist during your hospital stay. If you have any questions about your discharge medications or the care you received while you were in the hospital after you are discharged, you can call the unit and asked to speak with the hospitalist on call if the hospitalist that took care of you is not available. Once you are discharged, your primary care physician will handle any further medical issues. Please note that NO REFILLS for any discharge medications will be authorized  once you are discharged, as it is imperative that you return to your primary care physician (or establish a relationship with a primary care physician if you do not have one) for your aftercare needs so that they can reassess your need for medications and monitor your lab values.  Discharge Instructions   Diet - low sodium heart healthy    Complete by:  As directed      Diet Carb Modified    Complete by:  As directed      Discharge  instructions    Complete by:  As directed   1.) STOP CURRENT DOSE OF TOPROL XL AND TAKE NEW DOSE OF 50 MG I TAB DAILY. 2.) TAKE 1/2 TAB OF LOSARTAN DAILY. 3.)Follow up with your cardiologist about your carotid arteries-mild blockage on the left. 4.) Check your blood sugar several times weekly in the morning before eating. Call your doctor if it is above 140-150- consistently     Increase activity slowly    Complete by:  As directed           Discharge Medication List as of 04/09/2014  4:07 PM    START taking these medications   Details  cefUROXime (CEFTIN) 500 MG tablet Take 1 tablet (500 mg total) by mouth 2 (two) times daily with a meal. Take antibiotic for 5 more days., Starting 04/09/2014, Until Discontinued, Print      CONTINUE these medications which have CHANGED   Details  losartan (COZAAR) 100 MG tablet Take 0.5 tablets (50 mg total) by mouth daily., Starting 04/09/2014, Until Discontinued, No Print    metoprolol succinate (TOPROL-XL) 50 MG 24 hr tablet Take 1 tablet (50 mg total) by mouth daily. Take with or immediately following a meal., Starting 04/09/2014, Until Discontinued, Print      CONTINUE these medications which have NOT CHANGED   Details  ALPRAZolam (XANAX) 0.5 MG tablet Take 0.5 mg by mouth at bedtime as needed for sleep., Until Discontinued, Historical Med    amLODipine (NORVASC) 5 MG tablet Take 1 tablet (5 mg total) by mouth daily., Starting 01/01/2014, Until Discontinued, Normal    aspirin 81 MG EC tablet Take 81 mg by mouth daily.  , Until Discontinued, Historical Med    clopidogrel (PLAVIX) 75 MG tablet Take 75 mg by mouth daily., Until Discontinued, Historical Med    latanoprost (XALATAN) 0.005 % ophthalmic solution Place 1 drop into both eyes at bedtime. , Starting 01/10/2013, Until Discontinued, Historical Med    Multiple Vitamins-Minerals (CENTRUM SILVER PO) Take 1 tablet by mouth daily.  , Until Discontinued, Historical Med    nitroGLYCERIN (NITROSTAT)  0.4 MG SL tablet Place 1 tablet (0.4 mg total) under the tongue every 5 (five) minutes as needed for chest pain., Starting 01/29/2013, Until Discontinued, Normal    pantoprazole (PROTONIX) 40 MG tablet Take 40 mg by mouth 2 (two) times daily., Starting 01/17/2012, Until Discontinued, Historical Med    rosuvastatin (CRESTOR) 10 MG tablet Take 1 tablet (10 mg total) by mouth daily., Starting 08/13/2013, Until Discontinued, Normal    sucralfate (CARAFATE) 1 G tablet Take 1 tablet (1 g total) by mouth 4 (four) times daily -  with meals and at bedtime., Starting 03/15/2014, Until Discontinued, Print    timolol (TIMOPTIC) 0.5 % ophthalmic solution Place 1 drop into both eyes daily. , Starting 01/10/2013, Until Discontinued, Historical Med    tiZANidine (ZANAFLEX) 4 MG tablet Take 4 mg by mouth 3 (three) times daily as needed. Muscle spasm, Starting 02/27/2014,  Until Discontinued, Historical Med       Allergies  Allergen Reactions  . Altace [Ramipril] Cough  . Prednisone Other (See Comments)    "throat broke out"    Follow-up Information   Schedule an appointment as soon as possible for a visit with Purvis Kilts, MD. (To be seen in 1-2 weeks.)    Specialty:  Family Medicine   Contact information:   [address] [place] [address] [phone]       Schedule an appointment as soon as possible for a visit with Shelva Majestic A, MD. (To be seen in 3-4 weeks.)    Specialty:  Cardiology   Contact information:   [address] [place] [address] [phone]        The results of significant diagnostics from this hospitalization (including imaging, microbiology, ancillary and laboratory) are listed below for reference.    Significant Diagnostic Studies: Dg Chest 2 View  04/07/2014   CLINICAL DATA:  Syncopal episode and nausea ; history of previous esophagectomy and CABG  EXAM: CHEST  2 VIEW  COMPARISON:  Chest x-ray dated March 15, 2014  FINDINGS: The right  lung is well-expanded and clear. On the left the lung is well-expanded but there are coarse interstitial markings in the mid and lower lung zone which are somewhat more conspicuous than in the past. The heart is normal in size. The pulmonary vascularity is not engorged. The patient has undergone previous median sternotomy. There is no pleural effusion. The bony thorax is unremarkable.  IMPRESSION: COPD. Increased interstitial markings in the left mid and lower lung zone may be in part due to the patient positioning but subsegmental atelectasis or interstitial pneumonia superimposed upon chronic fibrotic change is suspected.   Electronically Signed   By: David  Martinique   On: 04/07/2014 11:32   Ct Head Wo Contrast  04/07/2014   CLINICAL DATA:  Syncopal episode.  EXAM: CT HEAD WITHOUT CONTRAST  TECHNIQUE: Contiguous axial images were obtained from the base of the skull through the vertex without intravenous contrast.  COMPARISON:  01/25/2004 ; brain MRI - 02/04/2004  FINDINGS: Rather extensive periventricular hypodensities have progressed in the interval, right greater than left. Given extensive background parenchymal abnormalities, there is no CT evidence acute large territory infarct. No intraparenchymal or extra-axial mass or hemorrhage. Unchanged size and configuration of the ventricles and basilar cisterns. No midline shift. Limited visualization of the paranasal sinuses and mastoid air cells are normal. No air-fluid level. Intracranial atherosclerosis. Regional soft tissues appear normal. No displaced calvarial fracture.  IMPRESSION: Interval progression of microvascular ischemic disease without definite acute intracranial process.   Electronically Signed   By: Sandi Mariscal M.D.   On: 04/07/2014 11:20   Ct Angio Chest Pe W/cm &/or Wo Cm  04/07/2014   CLINICAL DATA:  Shortness of breath. Prior history of esophageal cancer, post esophagectomy and gastric pull-through in 2010.  EXAM: CT ANGIOGRAPHY CHEST WITH  CONTRAST  TECHNIQUE: Multidetector CT imaging of the chest was performed using the standard protocol during bolus administration of intravenous contrast. Multiplanar CT image reconstructions and MIPs were obtained to evaluate the vascular anatomy.  CONTRAST:  123mL OMNIPAQUE IOHEXOL 350 MG/ML IV.  COMPARISON:  CTA chest 01/24/2009.  FINDINGS: Contrast opacification of the pulmonary arteries is good. No filling defects within either main pulmonary artery or their branches in either lung to suggest pulmonary embolism. Prior sternotomy CABG. Cardiac silhouette upper normal in size. No pericardial effusion. As best I can tell  on this nongated study, the coronary grafts appear patent. Severe atherosclerosis involving the thoracic and upper abdominal aorta without aneurysm or dissection.  Severe emphysematous changes throughout both lungs. Airspace consolidation involving the left upper lobe including the lingula and minimally in the central left lower lobe. Approximate 8 mm noncalcified nodule in the peripheral right lower lobe (series 5, image 55). No pulmonary parenchymal nodules or masses elsewhere. No pleural effusions.  Post surgical changes related to prior esophagectomy and gastric pull through without complicating features. Normal-sized mediastinal and bilateral hilar lymph nodes; no significant lymphadenopathy. Thyroid gland atrophic and otherwise unremarkable.  Very small cysts in the visualized liver. Foci of accessory splenic tissue medial to the upper pole of the spleen. Calcification anteriorly in the omentum of the upper abdomen, likely dystrophic and related to fat necrosis at the time of the prior surgery. Celiac, SMA and bilateral renal arteries patent though atherosclerotic Bone window images demonstrate diffuse thoracic spondylosis.  Review of the MIP images confirms the above findings.  IMPRESSION: 1. No evidence of pulmonary embolism. 2. 8 mm noncalcified nodule in the right lower lobe. This is at  the lower limits of detectability by PET. Therefore, follow-up CT chest in 3-6 months is recommended. 3. Pneumonia involving the left upper lobe and the central left lower lobe superimposed upon severe COPD/emphysema. 4. Esophagectomy and gastric pull-through without complicating features. 5. Benign upper abdominal findings as above.   Electronically Signed   By: Evangeline Dakin M.D.   On: 04/07/2014 15:48   US Carotid Bilateral  04/08/2014   CLINICAL DATA:  Near syncopal of the  EXAM: BILATERAL CAROTID DUPLEX ULTRASOUND  TECHNIQUE: Pearline Cables scale imaging, color Doppler and duplex ultrasound were performed of bilateral carotid and vertebral arteries in the neck.  COMPARISON:  None.  FINDINGS: Criteria: Quantification of carotid stenosis is based on velocity parameters that correlate the residual internal carotid diameter with NASCET-based stenosis levels, using the diameter of the distal internal carotid lumen as the denominator for stenosis measurement.  The following velocity measurements were obtained:  RIGHT  ICA:  93/14 cm/sec  CCA:  638/46 cm/sec  SYSTOLIC ICA/CCA RATIO:  6.59  DIASTOLIC ICA/CCA RATIO:  9.35  ECA:  196 cm/sec  LEFT  ICA:  171/33 cm/sec  CCA:  Its window 7/01 cm/sec  SYSTOLIC ICA/CCA RATIO:  7.79  DIASTOLIC ICA/CCA RATIO:  3.90  ECA:  36 cm/sec  RIGHT CAROTID ARTERY: Atherosclerotic plaque is noted within the carotid bulb and proximal internal carotid artery. The waveforms, velocities and flow velocity ratios however demonstrate no evidence of focal hemodynamically significant stenosis.  RIGHT VERTEBRAL ARTERY:  Antegrade in nature.  LEFT CAROTID ARTERY: Atherosclerotic plaque is noted bilaterally. The waveforms, velocities and flow velocity ratios suggest a stenosis in the 50-69% range in the left internal carotid artery. Additionally pre occlusive stenosis is noted in the left external carotid artery on the grayscale and color-flow images.  LEFT VERTEBRAL ARTERY:  Not visualized  IMPRESSION:  Bilateral atherosclerotic plaque in the carotid bulbs and internal carotid arteries. Focal 50-69% stenosis is noted in the left internal carotid artery.  Pre occlusive narrowing within the left external carotid artery.   Electronically Signed   By: Inez Catalina M.D.   On: 04/08/2014 10:06   Dg Chest Port 1 View  03/15/2014   CLINICAL DATA:  Chest pain.  EXAM: PORTABLE CHEST - 1 VIEW  COMPARISON:  CT chest 01/24/2009 and chest radiograph 07/26/2007.  FINDINGS: Trachea is midline. Heart size stable. Lungs are  clear. No pleural fluid.  IMPRESSION: No acute findings.   Electronically Signed   By: Lorin Picket M.D.   On: 03/15/2014 17:13    Microbiology: No results found for this or any previous visit (from the past 240 hour(s)).   Labs: Basic Metabolic Panel:  Recent Labs Lab 04/07/14 1011 04/08/14 0329 04/09/14 1032  NA 142 137 138  K 3.8 4.0 3.8  CL 104 103 103  CO2 26 23 24   GLUCOSE 67* 132* 148*  BUN 13 10 11   CREATININE 0.79 0.82 0.88  CALCIUM 9.2 8.5 8.9   Liver Function Tests:  Recent Labs Lab 04/07/14 1011  AST 20  ALT 19  ALKPHOS 89  BILITOT 0.5  PROT 7.7  ALBUMIN 4.1   No results found for this basename: LIPASE, AMYLASE,  in the last 168 hours No results found for this basename: AMMONIA,  in the last 168 hours CBC:  Recent Labs Lab 04/07/14 1011 04/08/14 0329 04/09/14 1032  WBC 12.0* 18.1* 12.4*  NEUTROABS 10.8*  --   --   HGB 15.7 12.9* 14.0  HCT 46.3 37.5* 40.9  MCV 90.3 89.5 90.5  PLT 253 198 227   Cardiac Enzymes:  Recent Labs Lab 04/07/14 1011 04/07/14 1130 04/07/14 1600 04/07/14 2145 04/08/14 0329  TROPONINI <0.30 <0.30 <0.30 <0.30 <0.30   BNP: BNP (last 3 results)  Recent Labs  04/07/14 1600  PROBNP 190.6*   CBG:  Recent Labs Lab 04/08/14 1644 04/08/14 2142 04/09/14 0408 04/09/14 0735 04/09/14 1119  GLUCAP 102* 110* 103* 109* 110*       Signed:  Kahlani Graber  [facility] 04/10/2014, 8:30 PM

## 2014-04-16 ENCOUNTER — Encounter: Payer: Self-pay | Admitting: Cardiovascular Disease

## 2014-04-16 ENCOUNTER — Other Ambulatory Visit: Payer: Self-pay | Admitting: *Deleted

## 2014-04-16 ENCOUNTER — Telehealth: Payer: Self-pay | Admitting: Cardiovascular Disease

## 2014-04-16 ENCOUNTER — Ambulatory Visit (INDEPENDENT_AMBULATORY_CARE_PROVIDER_SITE_OTHER): Payer: Medicare Other | Admitting: Cardiovascular Disease

## 2014-04-16 VITALS — BP 159/86 | HR 77 | Ht 68.0 in | Wt 159.2 lb

## 2014-04-16 DIAGNOSIS — E785 Hyperlipidemia, unspecified: Secondary | ICD-10-CM | POA: Diagnosis not present

## 2014-04-16 DIAGNOSIS — I6522 Occlusion and stenosis of left carotid artery: Secondary | ICD-10-CM

## 2014-04-16 DIAGNOSIS — I10 Essential (primary) hypertension: Secondary | ICD-10-CM

## 2014-04-16 DIAGNOSIS — R079 Chest pain, unspecified: Secondary | ICD-10-CM

## 2014-04-16 DIAGNOSIS — R0989 Other specified symptoms and signs involving the circulatory and respiratory systems: Secondary | ICD-10-CM

## 2014-04-16 DIAGNOSIS — I2510 Atherosclerotic heart disease of native coronary artery without angina pectoris: Secondary | ICD-10-CM

## 2014-04-16 DIAGNOSIS — I6529 Occlusion and stenosis of unspecified carotid artery: Secondary | ICD-10-CM

## 2014-04-16 DIAGNOSIS — K219 Gastro-esophageal reflux disease without esophagitis: Secondary | ICD-10-CM

## 2014-04-16 MED ORDER — ESOMEPRAZOLE MAGNESIUM 40 MG PO PACK: 40.0000 mg | PACK | Freq: Every day | ORAL | Status: DC

## 2014-04-16 MED ORDER — ESOMEPRAZOLE MAGNESIUM 40 MG PO CPDR: 40.0000 mg | DELAYED_RELEASE_CAPSULE | Freq: Every day | ORAL | Status: DC

## 2014-04-16 MED ORDER — ESOMEPRAZOLE MAGNESIUM 20 MG PO PACK: 20.0000 mg | PACK | Freq: Every day | ORAL | Status: DC

## 2014-04-16 NOTE — Progress Notes (Signed)
Patient ID: Gregory Eaton, male   DOB: 10-13-1945, 68 y.o.   MRN: [id number]      HPI: Gregory Eaton is a 68 y.o. male presents to the office today for 9 month cardiology evaluation.  Gregory Eaton has known coronary artery disease  underwent CABG revascularization surgery by Dr. Nils Pyle in 1998. In December 2010 he underwent esophageal cancer surgery at [facility]. Additional problems include hypertension as well as hyperlipidemia in addition to peripheral vascular disease. He has documented occluded left vertebral artery with normal antegrade flow the left external carotid and did have mild carotid stenoses are noted on his last Doppler study in detecting April 2013. He also is status post femoropopliteal bypass surgery and has occlusive disease in his right SFA.   He underwent a followup nuclear perfusion study on 07/24/2013. This continued to reveal normal perfusion without scar or ischemia, 17 years after his CABG revascularization surgery. He had also undergone followup carotid studies which again showed an occluded left vertebral artery and mild disease in his right and left internal carotid system with narrowings less than 49%. He also is status post femoropopliteal bypass surgery. His lungs have a Doppler study from 07/10/2013 showed ABIs of 1.4 bilaterally at his ankles. He did have occlusive disease with reconstitution above the knee and the right SFA. His ABI values were now noncompressible.  Gregory Eaton denies recent chest pain. He denies significant claudication symptoms. He denies dizziness. He is bothered by esophageal reflux. He status post prior surgery for esophageal cancer and states his stomach is in his thoracic cavity.  He had undergone laboratory in August by his primary physician and this was notable that he had mildly increased SGPT at 71 and an elevated calcium of 11.6.  However, he was hospitalized approximately a week and a half ago with pneumonia.  Followup calcium level  was normal at 9.2.  LFTs were normal.  He did have a followup carotid duplex scan, which again showed a 50-69% stenosis of the left internal carotid artery.  Additionally, there was preocclusive stenosis and left external carotid.  He denies recent chest pain.  He denies palpitations.  He has been having increased difficulty with GERD, particularly at night.  He has been taking Protonix now 40 mg twice a day without benefit and he is requesting changing to a different regimen.  He also is taking sucralfate 4 times daily with meals and at bedtime.  He is on amlodipine 5 mg, aspirin, and Plavix, losartan, which she's been taking reduced dose now, which is 15 mg instead of the previous 100 mg.  He states his blood pressures during the day excellent but in the morning are elevated.  He presents for evaluation.  Past Medical History  Diagnosis Date  . Esophageal carcinoma 03/2009    [id number]  . Hypertension   . Barrett's esophagus   . GERD (gastroesophageal reflux disease)   . CAD (coronary artery disease)   . PVD (peripheral vascular disease)   . Hyperlipidemia   . Adenomatous colon polyp 03/2009    Last colonoscopy by Dr. [name]   . Diverticulosis   . Tobacco abuse   . Tachyarrhythmia 1999    Status post ablation at [facility]  . COPD (chronic obstructive pulmonary disease)     Severe emphysema per CT  . History of echocardiogram 08/27/2009    EF >55%  . History of nuclear stress test 11/24/2011    lexiscan; normal perfusion; low risk scan; non-diagnostic for  ischemia  . History of Doppler ultrasound 11/09/2011    03/2014- 50-69% L ICA stenosis;carotid doppler; L bulb/prox ICA 0-49% diameter reduction; L vertebral artery - occlusive ds; L ECA  demonstrates severe amount of fibrous plaque  . History of Doppler ultrasound 11/09/11    LEAs; R ABI - mod art. insuff.; L ABI normal at rest; R SFA - occlusive ds; L SFA - occlusive ds; patent fem-pop graft  . Adenomatous polyp of colon 11/03/2010  . Left carotid  artery stenosis 04/08/2014  . Pulmonary nodule, right 04/08/2014    2.8 mm-incidental finding on CT  . DM type 2 (diabetes mellitus, type 2) 04/09/2014    A1c 6.5 (04/08/14)     Past Surgical History  Procedure Laterality Date  . Coronary artery bypass graft  1998    [name]  . Femoral-popliteal bypass graft  1993    occlusive ds in R SFA  . Lipoma excision  2010  . Esophagectomy  2010    [facility] Dr. [name]  . Gastric pull through  2010    With esophagectomy    Allergies  Allergen Reactions  . Altace [Ramipril] Cough  . Prednisone Other (See Comments)    "throat broke out"     Current Outpatient Prescriptions  Medication Sig Dispense Refill  . ALPRAZolam (XANAX) 0.5 MG tablet Take 0.5 mg by mouth at bedtime as needed for sleep.      Marland Kitchen amLODipine (NORVASC) 5 MG tablet Take 1 tablet (5 mg total) by mouth daily.  30 tablet  7  . aspirin 81 MG EC tablet Take 81 mg by mouth daily.        . clopidogrel (PLAVIX) 75 MG tablet Take 75 mg by mouth daily.      Marland Kitchen latanoprost (XALATAN) 0.005 % ophthalmic solution Place 1 drop into both eyes at bedtime.       Marland Kitchen losartan (COZAAR) 100 MG tablet Take 0.5 tablets (50 mg total) by mouth daily.      . metoprolol succinate (TOPROL-XL) 50 MG 24 hr tablet Take 1 tablet (50 mg total) by mouth daily. Take with or immediately following a meal.  30 tablet  3  . Multiple Vitamins-Minerals (CENTRUM SILVER PO) Take 1 tablet by mouth daily.        . nitroGLYCERIN (NITROSTAT) 0.4 MG SL tablet Place 1 tablet (0.4 mg total) under the tongue every 5 (five) minutes as needed for chest pain.  25 tablet  3  . pantoprazole (PROTONIX) 40 MG tablet Take 40 mg by mouth 2 (two) times daily.      . rosuvastatin (CRESTOR) 10 MG tablet Take 1 tablet (10 mg total) by mouth daily.  90 tablet  3  . sucralfate (CARAFATE) 1 G tablet Take 1 tablet (1 g total) by mouth 4 (four) times daily -  with meals and at bedtime.  40 tablet  0  . timolol (TIMOPTIC) 0.5 % ophthalmic  solution Place 1 drop into both eyes daily.       Marland Kitchen tiZANidine (ZANAFLEX) 4 MG tablet Take 4 mg by mouth 3 (three) times daily as needed. Muscle spasm      . esomeprazole (NEXIUM) 20 MG packet Take 20 mg by mouth daily before breakfast.  30 each  12   No current facility-administered medications for this visit.    Socially he is widowed has 2 children. He quit smoking in 1997.  ROS General: Negative; No fevers, chills, or night sweats;  HEENT: Negative; No changes in  vision or hearing, sinus congestion, difficulty swallowing Pulmonary: Positive for recent treatment for pneumonia. No cough, wheezing, shortness of breath, hemoptysis Cardiovascular: Negative; No chest pain, presyncope, syncope, palpitations GI: Positive for GERD; No nausea, vomiting, diarrhea, or abdominal pain GU: Negative; No dysuria, hematuria, or difficulty voiding Musculoskeletal: Negative; no myalgias, joint pain, or weakness Hematologic/Oncology: Negative; no easy bruising, bleeding Endocrine: Negative; no heat/cold intolerance; no diabetes Neuro: Negative; no changes in balance, headaches Skin: Negative; No rashes or skin lesions Psychiatric: Negative; No behavioral problems, depression Sleep: Negative; No snoring, daytime sleepiness, hypersomnolence, bruxism, restless legs, hypnogognic hallucinations, no cataplexy   PE BP 159/86  Pulse 77  Ht 5\' 8"  (1.727 m)  Wt 159 lb 3.2 oz (72.213 kg)  BMI 24.21 kg/m2  General: Alert, oriented, no distress.  Skin: normal turgor, no rashes HEENT: Normocephalic, atraumatic. Pupils round and reactive; sclera anicteric;no lid lag.  Nose without nasal septal hypertrophy Mouth/Parynx benign; Mallinpatti scale 2 Neck: No JVD, soft carotid bruits; normal carotid upstrokes. Lungs: clear to ausculatation and percussion; no wheezing or rales Chest wall: Nontender to palpation Heart: RRR, s1 s2 normal 1/6 systolic murmur; no diastolic murmur.  No rubs, thrills or heaves Back: No  CVA tenderness. Abdomen: soft, nontender; no hepatosplenomehaly, BS+; abdominal aorta nontender and not dilated by palpation. Pulses 2+ with exception of distally which were reduced bilaterally. Extremities: no clubbing cyanosis or edema, Homan's sign negative  Neurologic: grossly nonfocal Psychological: Normal affect and mood.   ECG (independently read by me): Sinus rhythm with first-degree AV block.  PR interval 214 ms.  Right bundle branch block with repolarization changes.   January 2015 ECG (independently read by me): Sinus rhythm 86 beats per minute. PR interval 200 ms.  Prior ECG of July 2014 :Sinus rhythm at 70 beats per minute. First degree AV block. 1 isolated unifocal PVC.  LABS:  BMET    Component Value Date/Time   NA 138 04/09/2014 1032   K 3.8 04/09/2014 1032   CL 103 04/09/2014 1032   CO2 24 04/09/2014 1032   GLUCOSE 148* 04/09/2014 1032   BUN 11 04/09/2014 1032   CREATININE 0.88 04/09/2014 1032   CREATININE 0.79 01/31/2013 0940   CALCIUM 8.9 04/09/2014 1032   GFRNONAA 86* 04/09/2014 1032   GFRAA >90 04/09/2014 1032     Hepatic Function Panel     Component Value Date/Time   PROT 7.7 04/07/2014 1011     CBC    Component Value Date/Time   WBC 12.4* 04/09/2014 1032   RBC 4.52 04/09/2014 1032   HGB 14.0 04/09/2014 1032   HCT 40.9 04/09/2014 1032   PLT 227 04/09/2014 1032   MCV 90.5 04/09/2014 1032   MCH 31.0 04/09/2014 1032   MCHC 34.2 04/09/2014 1032   RDW 13.6 04/09/2014 1032   LYMPHSABS 0.7 04/07/2014 1011   MONOABS 0.4 04/07/2014 1011   EOSABS 0.1 04/07/2014 1011   BASOSABS 0.0 04/07/2014 1011     BNP    Component Value Date/Time   PROBNP 190.6* 04/07/2014 1600    Lipid Panel     Component Value Date/Time   CHOL 96 01/31/2013 0940     RADIOLOGY: No results found.    ASSESSMENT AND PLAN: Gregory Eaton is 17 years s/p CABG revascularization surgery. His most recent nuclear perfusion study continues to suggest patency of his grafts and this was without scar  or ischemia. He is not having anginal symptoms on current therapy. With reference to his peripheral last disease, his most recent  carotid ultrasound done on 04/07/2014 showed bilateral atherosclerotic plaque in the carotid bulb and internal carotid arteries with focal 60-69% stenosis in the left internal carotid.  Preocclusive narrowing was noted in the left external carotid.  His left vertebral artery was not visualized on the most recent study.  He has occlusive disease in his right SFA but apparently continues to do well, status post femoropopliteal bypass surgery with ABIs at 1.4 which now are noncompressible. Reference to his hypertension, his dose of losartan was recently reduced from 100-50 mg.  He has been taking metoprolol 50 mg daily in an extended release form, as well as amlodipine 5 mg.  His blood pressure when initially checked today was 159/86 and when rechecked by me was 140/84.  It appears that his blood pressure is elevated in the early morning.  I have suggested that he change his losartan to 50 mg twice a day, which should provide 24-hour benefit.  He also is having difficulty with GERD despite taking Protonix 40 mg twice a day.  I suggested the possibility of excellent but he did not seem that he would be able to afford this.  For this reason, we'll try Nexium 40 mg daily.  With reference to his hyperlipidemia, he is now on Crestor 10 mg , which is a reduced dose from previously, but laboratory in the past on higher dose Crestor, had shown markedly reduced LDL at 43.  His most recent LFTs are normal on current therapy. I will see him in 6 months for followup evaluation or sooner if problems arise.   Troy Sine, MD, [facility]  04/16/2014 3:31 PM

## 2014-04-16 NOTE — Telephone Encounter (Signed)
Tammie called in regards to a E-Scribe Dr. Claiborne Billings put in for Nexium 20mg  packet. She would like to know if she could use capsules in stead. Please call back  Thanks

## 2014-04-16 NOTE — Telephone Encounter (Signed)
Resend for capsules to pharmacy was done by Barry Brunner CMA

## 2014-04-16 NOTE — Patient Instructions (Addendum)
Your physician has recommended you make the following change in your medication: stop the pantoprazole and start the generic nexium. This has already been sent to your pharmacy.  Your physician wants you to follow-up in: 6 months. You will receive a reminder letter in the mail two months in advance. If you don't receive a letter, please call our office to schedule the follow-up appointment.

## 2014-04-17 DIAGNOSIS — E279 Disorder of adrenal gland, unspecified: Secondary | ICD-10-CM | POA: Diagnosis not present

## 2014-04-17 DIAGNOSIS — Z87898 Personal history of other specified conditions: Secondary | ICD-10-CM | POA: Diagnosis not present

## 2014-04-17 DIAGNOSIS — C155 Malignant neoplasm of lower third of esophagus: Secondary | ICD-10-CM | POA: Diagnosis not present

## 2014-04-17 DIAGNOSIS — R918 Other nonspecific abnormal finding of lung field: Secondary | ICD-10-CM | POA: Diagnosis not present

## 2014-04-17 DIAGNOSIS — J189 Pneumonia, unspecified organism: Secondary | ICD-10-CM | POA: Diagnosis not present

## 2014-04-17 DIAGNOSIS — C159 Malignant neoplasm of esophagus, unspecified: Secondary | ICD-10-CM | POA: Diagnosis not present

## 2014-04-17 DIAGNOSIS — Z87891 Personal history of nicotine dependence: Secondary | ICD-10-CM | POA: Diagnosis not present

## 2014-04-18 DIAGNOSIS — Z23 Encounter for immunization: Secondary | ICD-10-CM | POA: Diagnosis not present

## 2014-04-18 DIAGNOSIS — J189 Pneumonia, unspecified organism: Secondary | ICD-10-CM | POA: Diagnosis not present

## 2014-04-18 DIAGNOSIS — Z6823 Body mass index (BMI) 23.0-23.9, adult: Secondary | ICD-10-CM | POA: Diagnosis not present

## 2014-04-18 DIAGNOSIS — E119 Type 2 diabetes mellitus without complications: Secondary | ICD-10-CM | POA: Diagnosis not present

## 2014-04-18 DIAGNOSIS — J302 Other seasonal allergic rhinitis: Secondary | ICD-10-CM | POA: Diagnosis not present

## 2014-04-26 ENCOUNTER — Other Ambulatory Visit: Payer: Self-pay | Admitting: Cardiovascular Disease

## 2014-04-26 NOTE — Telephone Encounter (Signed)
Rx was sent to pharmacy electronically. 

## 2014-05-21 DIAGNOSIS — J189 Pneumonia, unspecified organism: Secondary | ICD-10-CM | POA: Diagnosis not present

## 2014-05-21 DIAGNOSIS — E780 Pure hypercholesterolemia: Secondary | ICD-10-CM | POA: Diagnosis not present

## 2014-05-21 DIAGNOSIS — I10 Essential (primary) hypertension: Secondary | ICD-10-CM | POA: Diagnosis not present

## 2014-05-21 DIAGNOSIS — K2270 Barrett's esophagus without dysplasia: Secondary | ICD-10-CM | POA: Diagnosis not present

## 2014-05-21 DIAGNOSIS — C155 Malignant neoplasm of lower third of esophagus: Secondary | ICD-10-CM | POA: Diagnosis not present

## 2014-05-21 DIAGNOSIS — Z79899 Other long term (current) drug therapy: Secondary | ICD-10-CM | POA: Diagnosis not present

## 2014-05-21 DIAGNOSIS — Z923 Personal history of irradiation: Secondary | ICD-10-CM | POA: Diagnosis not present

## 2014-05-21 DIAGNOSIS — Z9049 Acquired absence of other specified parts of digestive tract: Secondary | ICD-10-CM | POA: Diagnosis not present

## 2014-05-21 DIAGNOSIS — Z888 Allergy status to other drugs, medicaments and biological substances status: Secondary | ICD-10-CM | POA: Diagnosis not present

## 2014-05-21 DIAGNOSIS — Z7982 Long term (current) use of aspirin: Secondary | ICD-10-CM | POA: Diagnosis not present

## 2014-05-21 DIAGNOSIS — K219 Gastro-esophageal reflux disease without esophagitis: Secondary | ICD-10-CM | POA: Diagnosis not present

## 2014-05-30 DIAGNOSIS — H40023 Open angle with borderline findings, high risk, bilateral: Secondary | ICD-10-CM | POA: Diagnosis not present

## 2014-06-10 ENCOUNTER — Telehealth (HOSPITAL_COMMUNITY): Payer: Self-pay | Admitting: *Deleted

## 2014-06-12 ENCOUNTER — Telehealth (HOSPITAL_COMMUNITY): Payer: Self-pay | Admitting: *Deleted

## 2014-06-28 DIAGNOSIS — H4011X1 Primary open-angle glaucoma, mild stage: Secondary | ICD-10-CM | POA: Diagnosis not present

## 2014-06-28 DIAGNOSIS — H2513 Age-related nuclear cataract, bilateral: Secondary | ICD-10-CM | POA: Diagnosis not present

## 2014-06-28 DIAGNOSIS — H4011X3 Primary open-angle glaucoma, severe stage: Secondary | ICD-10-CM | POA: Diagnosis not present

## 2014-06-28 DIAGNOSIS — H25013 Cortical age-related cataract, bilateral: Secondary | ICD-10-CM | POA: Diagnosis not present

## 2014-07-01 ENCOUNTER — Telehealth (HOSPITAL_COMMUNITY): Payer: Self-pay | Admitting: *Deleted

## 2014-07-01 DIAGNOSIS — H4011X3 Primary open-angle glaucoma, severe stage: Secondary | ICD-10-CM | POA: Diagnosis not present

## 2014-07-03 ENCOUNTER — Other Ambulatory Visit (HOSPITAL_COMMUNITY): Payer: Self-pay | Admitting: Cardiovascular Disease

## 2014-07-03 DIAGNOSIS — I739 Peripheral vascular disease, unspecified: Secondary | ICD-10-CM

## 2014-07-08 ENCOUNTER — Ambulatory Visit (HOSPITAL_COMMUNITY)
Admission: RE | Admit: 2014-07-08 | Discharge: 2014-07-08 | Disposition: A | Discharge status: Home / Self Care | Payer: Medicare Other | Source: Ambulatory Visit | Attending: Cardiology | Admitting: Cardiology

## 2014-07-08 DIAGNOSIS — Z95828 Presence of other vascular implants and grafts: Secondary | ICD-10-CM | POA: Insufficient documentation

## 2014-07-08 DIAGNOSIS — I739 Peripheral vascular disease, unspecified: Secondary | ICD-10-CM | POA: Diagnosis not present

## 2014-07-08 NOTE — Progress Notes (Signed)
Arterial Duplex Lower Ext. Completed. Shristi Scheib, BS, RDMS, RVT  

## 2014-07-09 ENCOUNTER — Telehealth: Payer: Self-pay | Admitting: *Deleted

## 2014-07-09 NOTE — Telephone Encounter (Signed)
Called and notified patient of doppler results. Understanding of results verbalized. Patient told to keep 10/07/14 office appointment.

## 2014-07-09 NOTE — Telephone Encounter (Signed)
-----   Message from Troy Sine, MD sent at 07/09/2014  2:06 PM EST ----- No change from prior study; occluded R SFA; patent L fem-pop

## 2014-07-15 DIAGNOSIS — H4011X1 Primary open-angle glaucoma, mild stage: Secondary | ICD-10-CM | POA: Diagnosis not present

## 2014-08-16 DIAGNOSIS — H4011X1 Primary open-angle glaucoma, mild stage: Secondary | ICD-10-CM | POA: Diagnosis not present

## 2014-08-16 DIAGNOSIS — H4011X3 Primary open-angle glaucoma, severe stage: Secondary | ICD-10-CM | POA: Diagnosis not present

## 2014-09-10 ENCOUNTER — Other Ambulatory Visit: Payer: Self-pay | Admitting: Cardiovascular Disease

## 2014-09-10 NOTE — Telephone Encounter (Signed)
Rx refill sent to patient pharmacy   

## 2014-09-18 DIAGNOSIS — H4011X3 Primary open-angle glaucoma, severe stage: Secondary | ICD-10-CM | POA: Diagnosis not present

## 2014-09-18 DIAGNOSIS — H25013 Cortical age-related cataract, bilateral: Secondary | ICD-10-CM | POA: Diagnosis not present

## 2014-09-18 DIAGNOSIS — H524 Presbyopia: Secondary | ICD-10-CM | POA: Diagnosis not present

## 2014-09-18 DIAGNOSIS — H2513 Age-related nuclear cataract, bilateral: Secondary | ICD-10-CM | POA: Diagnosis not present

## 2014-10-01 DIAGNOSIS — H2512 Age-related nuclear cataract, left eye: Secondary | ICD-10-CM | POA: Diagnosis not present

## 2014-10-07 ENCOUNTER — Ambulatory Visit (INDEPENDENT_AMBULATORY_CARE_PROVIDER_SITE_OTHER): Payer: Medicare Other | Admitting: Cardiovascular Disease

## 2014-10-07 VITALS — BP 150/80 | HR 64 | Ht 68.0 in | Wt 164.6 lb

## 2014-10-07 DIAGNOSIS — I25810 Atherosclerosis of coronary artery bypass graft(s) without angina pectoris: Secondary | ICD-10-CM

## 2014-10-07 DIAGNOSIS — E785 Hyperlipidemia, unspecified: Secondary | ICD-10-CM

## 2014-10-07 DIAGNOSIS — I10 Essential (primary) hypertension: Secondary | ICD-10-CM

## 2014-10-07 DIAGNOSIS — I6522 Occlusion and stenosis of left carotid artery: Secondary | ICD-10-CM

## 2014-10-07 DIAGNOSIS — I2510 Atherosclerotic heart disease of native coronary artery without angina pectoris: Secondary | ICD-10-CM | POA: Diagnosis not present

## 2014-10-07 MED ORDER — PANTOPRAZOLE SODIUM 40 MG PO TBEC: 40.0000 mg | DELAYED_RELEASE_TABLET | Freq: Every day | ORAL | Status: DC

## 2014-10-07 MED ORDER — LOSARTAN POTASSIUM 50 MG PO TABS: 75.0000 mg | ORAL_TABLET | Freq: Every day | ORAL | Status: DC

## 2014-10-07 MED ORDER — ALPRAZOLAM 0.5 MG PO TABS: 0.5000 mg | ORAL_TABLET | Freq: Every day | ORAL | Status: DC

## 2014-10-07 MED ORDER — LOSARTAN POTASSIUM 50 MG PO TABS: 50.0000 mg | ORAL_TABLET | Freq: Every day | ORAL | Status: DC

## 2014-10-07 NOTE — Patient Instructions (Signed)
Your physician has recommended you make the following change in your medication: the losartan has been changed to 75 mg daily ( 1 & 1/2 tablet)  Your nexium has been changed to Pantoprazole.  Your physician wants you to follow-up in: 6 months or sooner if needed. You will receive a reminder letter in the mail two months in advance. If you don't receive a letter, please call our office to schedule the follow-up appointment.

## 2014-10-09 ENCOUNTER — Encounter: Payer: Self-pay | Admitting: Cardiovascular Disease

## 2014-10-09 DIAGNOSIS — E785 Hyperlipidemia, unspecified: Secondary | ICD-10-CM | POA: Insufficient documentation

## 2014-10-09 NOTE — Progress Notes (Signed)
Patient ID: Gregory Eaton, male   DOB: [birth year], 69 y.o.   MRN: [id number]    HPI: Gregory Eaton is a 69 y.o. male presents to the office today for 6 month cardiology evaluation.  Gregory Eaton has known CAD and underwent CABG revascularization surgery by Dr. Nils Pyle in 1998. In December 2010 he underwent esophageal cancer surgery at [facility]. Additional problems include hypertension as well as hyperlipidemia in addition to peripheral vascular disease. He has documented occluded left vertebral artery with normal antegrade flow the left external carotid and did have mild carotid stenoses are noted on his last Doppler study in detecting April 2013. He also is status post femoropopliteal bypass surgery and has occlusive disease in his right SFA.   He underwent a followup nuclear perfusion study on 07/24/2013. This continued to reveal normal perfusion without scar or ischemia, 17 years after his CABG revascularization surgery. He had also undergone followup carotid studies which again showed an occluded left vertebral artery and mild disease in his right and left internal carotid system with narrowings less than 49%. He also is status post femoropopliteal bypass surgery. His lungs have a Doppler study from 07/10/2013 showed ABIs of 1.4 bilaterally at his ankles. He did have occlusive disease with reconstitution above the knee and the right SFA. His ABI values were now noncompressible.  Gregory Eaton denies recent chest pain. He denies significant claudication symptoms. He denies dizziness. He is bothered by esophageal reflux. He status post prior surgery for esophageal cancer and states his stomach is in his thoracic cavity.  Lboratory in August by his primary physician and this was notable that he had mildly increased SGPT at 71 and an elevated calcium of 11.6.  He was hospitalized in September 2015 with pneumonia.  Followup calcium level was normal at 9.2.  LFTs were normal.  He did have a followup  carotid duplex scan, which again showed a 50-69% stenosis of the left internal carotid artery.  Additionally, there was preocclusive stenosis and left external carotid.  Over the past 6 months, he denies any chest pain.  He denies palpitations.  There is some mild shortness of breath with significant activity.  As a history of GERD for which he takes PPI therapy.  He had been taking Nexium but apparently this is no longer covered by his insurance and he is requesting a switch to again try Protonix.  He is on amlodipine 5 mg, aspirin, and Plavix, losartan 50 mg. he states recently his blood pressure at home typically is in the 409 range systolically.  He presents for evaluation  Past Medical History  Diagnosis Date  . Esophageal carcinoma 03/2009    T1N1M0  . Hypertension   . Barrett's esophagus   . GERD (gastroesophageal reflux disease)   . CAD (coronary artery disease)   . PVD (peripheral vascular disease)   . Hyperlipidemia   . Adenomatous colon polyp 03/2009    Last colonoscopy by Dr. [name]   . Diverticulosis   . Tobacco abuse   . Tachyarrhythmia 1999    Status post ablation at [facility]  . COPD (chronic obstructive pulmonary disease)     Severe emphysema per CT  . History of echocardiogram 08/27/2009    EF >55%  . History of nuclear stress test 11/24/2011    lexiscan; normal perfusion; low risk scan; non-diagnostic for ischemia  . History of Doppler ultrasound 11/09/2011    03/2014- 50-69% L ICA stenosis;carotid doppler; L bulb/prox ICA 0-49% diameter reduction;  L vertebral artery - occlusive ds; L ECA  demonstrates severe amount of fibrous plaque  . History of Doppler ultrasound 11/09/11    LEAs; R ABI - mod art. insuff.; L ABI normal at rest; R SFA - occlusive ds; L SFA - occlusive ds; patent fem-pop graft  . Adenomatous polyp of colon 11/03/2010  . Left carotid artery stenosis 04/08/2014  . Pulmonary nodule, right 04/08/2014    2.8 mm-incidental finding on CT  . DM type 2 (diabetes mellitus,  type 2) 04/09/2014    A1c 6.5 (04/08/14)     Past Surgical History  Procedure Laterality Date  . Coronary artery bypass graft  1998    [name]  . Femoral-popliteal bypass graft  1993    occlusive ds in R SFA  . Lipoma excision  2010  . Esophagectomy  2010    [facility] Dr. [name]  . Gastric pull through  2010    With esophagectomy    Allergies  Allergen Reactions  . Altace [Ramipril] Cough  . Prednisone Other (See Comments)    "throat broke out"     Current Outpatient Prescriptions  Medication Sig Dispense Refill  . ALPRAZolam (XANAX) 0.5 MG tablet Take 1 tablet (0.5 mg total) by mouth at bedtime. 30 tablet 0  . amLODipine (NORVASC) 5 MG tablet TAKE ONE (1) TABLET BY MOUTH EVERY DAY 90 tablet 2  . aspirin 81 MG EC tablet Take 81 mg by mouth daily.      . brimonidine (ALPHAGAN) 0.2 % ophthalmic solution Place 1 drop into the left eye 4 (four) times daily.    . clopidogrel (PLAVIX) 75 MG tablet TAKE ONE (1) TABLET EACH DAY 30 tablet 11  . CRESTOR 10 MG tablet TAKE ONE (1) TABLET BY MOUTH EVERY DAY 90 tablet 2  . dorzolamide-timolol (COSOPT) 22.3-6.8 MG/ML ophthalmic solution Place 1 drop into the left eye 4 (four) times daily.    Marland Kitchen ketorolac (ACULAR) 0.5 % ophthalmic solution Place 1 drop into the left eye 4 (four) times daily.    Marland Kitchen latanoprost (XALATAN) 0.005 % ophthalmic solution Place 1 drop into both eyes at bedtime.     Marland Kitchen losartan (COZAAR) 50 MG tablet Take 1.5 tablets (75 mg total) by mouth daily. 45 tablet 6  . metoprolol succinate (TOPROL-XL) 50 MG 24 hr tablet Take 1 tablet (50 mg total) by mouth daily. Take with or immediately following a meal. 30 tablet 3  . montelukast (SINGULAIR) 10 MG tablet Take 1 tablet by mouth daily.    . Multiple Vitamins-Minerals (CENTRUM SILVER PO) Take 1 tablet by mouth daily.      . nitroGLYCERIN (NITROSTAT) 0.4 MG SL tablet Place 1 tablet (0.4 mg total) under the tongue every 5 (five) minutes as needed for chest pain. 25 tablet 3  .  ofloxacin (OCUFLOX) 0.3 % ophthalmic solution Place 1 drop into the left eye daily.    . pantoprazole (PROTONIX) 40 MG tablet Take 1 tablet (40 mg total) by mouth daily. 30 tablet 11  . prednisoLONE acetate (PRED FORTE) 1 % ophthalmic suspension Place 1 drop into the left eye 4 (four) times daily.    . sucralfate (CARAFATE) 1 G tablet Take 1 tablet (1 g total) by mouth 4 (four) times daily -  with meals and at bedtime. 40 tablet 0  . timolol (TIMOPTIC) 0.5 % ophthalmic solution Place 1 drop into both eyes daily.     Marland Kitchen tiZANidine (ZANAFLEX) 4 MG tablet Take 4 mg by mouth 3 (three)  times daily as needed. Muscle spasm     No current facility-administered medications for this visit.    Socially he is widowed has 2 children. He quit smoking in 1997.  ROS General: Negative; No fevers, chills, or night sweats;  HEENT: Negative; No changes in vision or hearing, sinus congestion, difficulty swallowing Pulmonary: Positive for recent treatment for pneumonia. No cough, wheezing, shortness of breath, hemoptysis Cardiovascular: Negative; No chest pain, presyncope, syncope, palpitations GI: Positive for GERD; No nausea, vomiting, diarrhea, or abdominal pain GU: Negative; No dysuria, hematuria, or difficulty voiding Musculoskeletal: Negative; no myalgias, joint pain, or weakness Hematologic/Oncology: Negative; no easy bruising, bleeding Endocrine: Negative; no heat/cold intolerance; no diabetes Neuro: Negative; no changes in balance, headaches Skin: Negative; No rashes or skin lesions Psychiatric: Negative; No behavioral problems, depression Sleep: Negative; No snoring, daytime sleepiness, hypersomnolence, bruxism, restless legs, hypnogognic hallucinations, no cataplexy   PE BP 150/80 mmHg  Pulse 64  Ht $R'5\' 8"'sg$  (1.727 m)  Wt 164 lb 9.6 oz (74.662 kg)  BMI 25.03 kg/m2  Repeat blood pressure by me 142/80 General: Alert, oriented, no distress.  Skin: normal turgor, no rashes HEENT: Normocephalic,  atraumatic. Pupils round and reactive; sclera anicteric;no lid lag.  Nose without nasal septal hypertrophy Mouth/Parynx benign; Mallinpatti scale 2 Neck: No JVD, soft carotid bruits; normal carotid upstrokes. Lungs: clear to ausculatation and percussion; no wheezing or rales Chest wall: Nontender to palpation Heart: RRR, s1 s2 normal 1/6 systolic murmur; no diastolic murmur.  No rubs, thrills or heaves Back: No CVA tenderness. Abdomen: soft, nontender; no hepatosplenomehaly, BS+; abdominal aorta nontender and not dilated by palpation. Pulses 2+ with exception of distally which were reduced bilaterally. Extremities: no clubbing cyanosis or edema, Homan's sign negative  Neurologic: grossly nonfocal Psychological: Normal affect and mood.  ECG (independently read by me): Normal sinus rhythm at 64 bpm.  Right bundle branch block with repolarization changes.  First-degree AV block with a PR interval of 228 ms.  September 2015 ECG (independently read by me): Sinus rhythm with first-degree AV block.  PR interval 214 ms.  Right bundle branch block with repolarization changes.   January 2015 ECG (independently read by me): Sinus rhythm 86 beats per minute. PR interval 200 ms.  Prior ECG of July 2014 :Sinus rhythm at 70 beats per minute. First degree AV block. 1 isolated unifocal PVC.  LABS:  BMET  BMP Latest Ref Rng 04/09/2014 04/08/2014 04/07/2014  Glucose 70 - 99 mg/dL 148(H) 132(H) 67(L)  BUN 6 - 23 mg/dL $Remove'11 10 13  'xyrbIDm$ Creatinine 0.50 - 1.35 mg/dL 0.88 0.82 0.79  Sodium 137 - 147 mEq/L 138 137 142  Potassium 3.7 - 5.3 mEq/L 3.8 4.0 3.8  Chloride 96 - 112 mEq/L 103 103 104  CO2 19 - 32 mEq/L $Remove'24 23 26  'oLjGBaG$ Calcium 8.4 - 10.5 mg/dL 8.9 8.5 9.2     Hepatic Function Panel   Hepatic Function Latest Ref Rng 04/07/2014 03/15/2014 01/31/2013  Total Protein 6.0 - 8.3 g/dL 7.7 6.9 6.3  Albumin 3.5 - 5.2 g/dL 4.1 3.7 4.2  AST 0 - 37 U/L $Remo'20 15 15  'LwVKu$ ALT 0 - 53 U/L $Remo'19 15 14  'Nnveh$ Alk Phosphatase 39 - 117 U/L 89  77 67  Total Bilirubin 0.3 - 1.2 mg/dL 0.5 0.5 0.6    CBC  CBC Latest Ref Rng 04/09/2014 04/08/2014 04/07/2014  WBC 4.0 - 10.5 K/uL 12.4(H) 18.1(H) 12.0(H)  Hemoglobin 13.0 - 17.0 g/dL 14.0 12.9(L) 15.7  Hematocrit 39.0 - 52.0 % 40.9  37.5(L) 46.3  Platelets 150 - 400 K/uL 227 198 253      BNP    Component Value Date/Time   PROBNP 190.6* 04/07/2014 1600    Lipid Panel     Component Value Date/Time   CHOL 96 01/31/2013 0940   TRIG 78 01/31/2013 0940   HDL 37* 01/31/2013 0940   CHOLHDL 2.6 01/31/2013 0940   VLDL 16 01/31/2013 0940   LDLCALC 43 01/31/2013 0940     RADIOLOGY: No results found.    ASSESSMENT AND PLAN: Mr. Mccurdy is a 69 year old gentleman with both CAD as well as PVD. He is 18 years s/p CABG revascularization surgery. His most recent nuclear perfusion study continues to suggest patency of his grafts and this was without scar or ischemia. He is not having anginal symptoms on current therapy. His most recent carotid ultrasound  on 04/07/2014 showed bilateral atherosclerotic plaque in the carotid bulb and internal carotid arteries with focal 60-69% stenosis in the left internal carotid.  Preocclusive narrowing was noted in the left external carotid.  His left vertebral artery was not visualized on the most recent study.  He has occlusive disease in his right SFA but apparently continues to do well, status post femoropopliteal bypass surgery with ABIs at 1.4 which now are noncompressible. I last saw him, his blood pressure was elevated.  I had suggested that he change his losartan to 50 mg twice a day.  Apparently he never did this and has only been taking the losartan at 50 mg daily.  His blood pressure today is elevated.  I have suggested at least increasing his losartan to 75 mg daily for more optimal blood pressure control.  He has GERD and had tolerated Nexium but apparently since this no longer is being covered by his insurance he would like a retrial of Protonix.  He  continues to be on Crestor 10 mg daily for hyperlipidemia.  On this therapy in the past his LDL was excellent at 43.  If he has not had recent lipid studies these should be repeated.  His right bundle branch block is unchanged from his last ECG.  He denies any symptoms of presyncope or syncope.  I will see him in 6 months for follow-up evaluation.  I said if he has had recent laboratory by his primary physician that this be sent to me for my review.   Troy Sine, MD, [facility]  10/09/2014 6:37 PM

## 2014-10-30 DIAGNOSIS — H25011 Cortical age-related cataract, right eye: Secondary | ICD-10-CM | POA: Diagnosis not present

## 2014-10-30 DIAGNOSIS — H2511 Age-related nuclear cataract, right eye: Secondary | ICD-10-CM | POA: Diagnosis not present

## 2014-11-01 DIAGNOSIS — R938 Abnormal findings on diagnostic imaging of other specified body structures: Secondary | ICD-10-CM | POA: Diagnosis not present

## 2014-11-01 DIAGNOSIS — J189 Pneumonia, unspecified organism: Secondary | ICD-10-CM | POA: Diagnosis not present

## 2014-11-01 DIAGNOSIS — Z6824 Body mass index (BMI) 24.0-24.9, adult: Secondary | ICD-10-CM | POA: Diagnosis not present

## 2014-11-01 DIAGNOSIS — F419 Anxiety disorder, unspecified: Secondary | ICD-10-CM | POA: Diagnosis not present

## 2014-11-04 DIAGNOSIS — M47816 Spondylosis without myelopathy or radiculopathy, lumbar region: Secondary | ICD-10-CM | POA: Diagnosis not present

## 2014-11-04 DIAGNOSIS — S336XXA Sprain of sacroiliac joint, initial encounter: Secondary | ICD-10-CM | POA: Diagnosis not present

## 2014-11-04 DIAGNOSIS — M9903 Segmental and somatic dysfunction of lumbar region: Secondary | ICD-10-CM | POA: Diagnosis not present

## 2014-11-06 ENCOUNTER — Other Ambulatory Visit: Payer: Self-pay | Admitting: *Deleted

## 2014-11-06 DIAGNOSIS — R911 Solitary pulmonary nodule: Secondary | ICD-10-CM | POA: Diagnosis not present

## 2014-11-06 DIAGNOSIS — S336XXA Sprain of sacroiliac joint, initial encounter: Secondary | ICD-10-CM | POA: Diagnosis not present

## 2014-11-06 DIAGNOSIS — M47816 Spondylosis without myelopathy or radiculopathy, lumbar region: Secondary | ICD-10-CM | POA: Diagnosis not present

## 2014-11-06 DIAGNOSIS — M9903 Segmental and somatic dysfunction of lumbar region: Secondary | ICD-10-CM | POA: Diagnosis not present

## 2014-11-06 MED ORDER — METOPROLOL SUCCINATE ER 50 MG PO TB24: 50.0000 mg | ORAL_TABLET | Freq: Every day | ORAL | Status: DC

## 2014-11-06 NOTE — Telephone Encounter (Signed)
Rx(s) sent to pharmacy electronically.  

## 2014-11-07 ENCOUNTER — Other Ambulatory Visit (HOSPITAL_COMMUNITY): Payer: Self-pay | Admitting: Family Medicine

## 2014-11-07 DIAGNOSIS — R9389 Abnormal findings on diagnostic imaging of other specified body structures: Secondary | ICD-10-CM

## 2014-11-11 ENCOUNTER — Other Ambulatory Visit (HOSPITAL_COMMUNITY): Payer: Medicare Other

## 2014-11-18 DIAGNOSIS — S336XXA Sprain of sacroiliac joint, initial encounter: Secondary | ICD-10-CM | POA: Diagnosis not present

## 2014-11-18 DIAGNOSIS — M9903 Segmental and somatic dysfunction of lumbar region: Secondary | ICD-10-CM | POA: Diagnosis not present

## 2014-11-18 DIAGNOSIS — M47816 Spondylosis without myelopathy or radiculopathy, lumbar region: Secondary | ICD-10-CM | POA: Diagnosis not present

## 2014-11-19 DIAGNOSIS — H2511 Age-related nuclear cataract, right eye: Secondary | ICD-10-CM | POA: Diagnosis not present

## 2014-11-21 DIAGNOSIS — S336XXA Sprain of sacroiliac joint, initial encounter: Secondary | ICD-10-CM | POA: Diagnosis not present

## 2014-11-21 DIAGNOSIS — M9903 Segmental and somatic dysfunction of lumbar region: Secondary | ICD-10-CM | POA: Diagnosis not present

## 2014-11-21 DIAGNOSIS — M5441 Lumbago with sciatica, right side: Secondary | ICD-10-CM | POA: Diagnosis not present

## 2014-11-21 DIAGNOSIS — M47816 Spondylosis without myelopathy or radiculopathy, lumbar region: Secondary | ICD-10-CM | POA: Diagnosis not present

## 2014-11-27 DIAGNOSIS — M5441 Lumbago with sciatica, right side: Secondary | ICD-10-CM | POA: Diagnosis not present

## 2014-11-27 DIAGNOSIS — S336XXA Sprain of sacroiliac joint, initial encounter: Secondary | ICD-10-CM | POA: Diagnosis not present

## 2014-11-27 DIAGNOSIS — M47816 Spondylosis without myelopathy or radiculopathy, lumbar region: Secondary | ICD-10-CM | POA: Diagnosis not present

## 2014-11-27 DIAGNOSIS — M545 Low back pain: Secondary | ICD-10-CM | POA: Diagnosis not present

## 2014-11-27 DIAGNOSIS — M9903 Segmental and somatic dysfunction of lumbar region: Secondary | ICD-10-CM | POA: Diagnosis not present

## 2014-11-29 DIAGNOSIS — M5441 Lumbago with sciatica, right side: Secondary | ICD-10-CM | POA: Diagnosis not present

## 2014-11-29 DIAGNOSIS — S336XXA Sprain of sacroiliac joint, initial encounter: Secondary | ICD-10-CM | POA: Diagnosis not present

## 2014-11-29 DIAGNOSIS — M545 Low back pain: Secondary | ICD-10-CM | POA: Diagnosis not present

## 2014-11-29 DIAGNOSIS — M47816 Spondylosis without myelopathy or radiculopathy, lumbar region: Secondary | ICD-10-CM | POA: Diagnosis not present

## 2014-11-29 DIAGNOSIS — M9903 Segmental and somatic dysfunction of lumbar region: Secondary | ICD-10-CM | POA: Diagnosis not present

## 2014-12-02 DIAGNOSIS — M5441 Lumbago with sciatica, right side: Secondary | ICD-10-CM | POA: Diagnosis not present

## 2014-12-02 DIAGNOSIS — M545 Low back pain: Secondary | ICD-10-CM | POA: Diagnosis not present

## 2014-12-02 DIAGNOSIS — M47816 Spondylosis without myelopathy or radiculopathy, lumbar region: Secondary | ICD-10-CM | POA: Diagnosis not present

## 2014-12-02 DIAGNOSIS — S336XXA Sprain of sacroiliac joint, initial encounter: Secondary | ICD-10-CM | POA: Diagnosis not present

## 2014-12-02 DIAGNOSIS — M9903 Segmental and somatic dysfunction of lumbar region: Secondary | ICD-10-CM | POA: Diagnosis not present

## 2014-12-04 DIAGNOSIS — J019 Acute sinusitis, unspecified: Secondary | ICD-10-CM | POA: Diagnosis not present

## 2014-12-04 DIAGNOSIS — Z681 Body mass index (BMI) 19 or less, adult: Secondary | ICD-10-CM | POA: Diagnosis not present

## 2014-12-04 DIAGNOSIS — J301 Allergic rhinitis due to pollen: Secondary | ICD-10-CM | POA: Diagnosis not present

## 2014-12-09 DIAGNOSIS — M9903 Segmental and somatic dysfunction of lumbar region: Secondary | ICD-10-CM | POA: Diagnosis not present

## 2014-12-09 DIAGNOSIS — M47816 Spondylosis without myelopathy or radiculopathy, lumbar region: Secondary | ICD-10-CM | POA: Diagnosis not present

## 2014-12-09 DIAGNOSIS — S336XXA Sprain of sacroiliac joint, initial encounter: Secondary | ICD-10-CM | POA: Diagnosis not present

## 2014-12-09 DIAGNOSIS — M545 Low back pain: Secondary | ICD-10-CM | POA: Diagnosis not present

## 2014-12-09 DIAGNOSIS — M5441 Lumbago with sciatica, right side: Secondary | ICD-10-CM | POA: Diagnosis not present

## 2014-12-11 ENCOUNTER — Other Ambulatory Visit (HOSPITAL_COMMUNITY): Payer: Self-pay | Admitting: Physician Assistant

## 2014-12-11 ENCOUNTER — Ambulatory Visit (HOSPITAL_COMMUNITY)
Admission: RE | Admit: 2014-12-11 | Discharge: 2014-12-11 | Disposition: A | Discharge status: Home / Self Care | Payer: Medicare Other | Source: Ambulatory Visit | Attending: Physician Assistant | Admitting: Physician Assistant

## 2014-12-11 DIAGNOSIS — J438 Other emphysema: Secondary | ICD-10-CM | POA: Diagnosis not present

## 2014-12-11 DIAGNOSIS — R05 Cough: Secondary | ICD-10-CM

## 2014-12-11 DIAGNOSIS — R059 Cough, unspecified: Secondary | ICD-10-CM

## 2014-12-11 DIAGNOSIS — Z6824 Body mass index (BMI) 24.0-24.9, adult: Secondary | ICD-10-CM | POA: Diagnosis not present

## 2014-12-11 DIAGNOSIS — J209 Acute bronchitis, unspecified: Secondary | ICD-10-CM

## 2014-12-27 DIAGNOSIS — Z6824 Body mass index (BMI) 24.0-24.9, adult: Secondary | ICD-10-CM | POA: Diagnosis not present

## 2014-12-27 DIAGNOSIS — J209 Acute bronchitis, unspecified: Secondary | ICD-10-CM | POA: Diagnosis not present

## 2014-12-27 DIAGNOSIS — J439 Emphysema, unspecified: Secondary | ICD-10-CM | POA: Diagnosis not present

## 2015-01-07 ENCOUNTER — Telehealth: Payer: Self-pay

## 2015-01-07 NOTE — Telephone Encounter (Signed)
Received records from [facility] forwarded to [facility] 01/07/15 fbg.

## 2015-01-24 ENCOUNTER — Institutional Professional Consult (permissible substitution): Payer: Medicare Other | Admitting: Pulmonary Disease

## 2015-01-30 ENCOUNTER — Other Ambulatory Visit: Payer: Medicare Other

## 2015-01-30 ENCOUNTER — Encounter: Payer: Self-pay | Admitting: Pulmonary Disease

## 2015-01-30 ENCOUNTER — Ambulatory Visit (INDEPENDENT_AMBULATORY_CARE_PROVIDER_SITE_OTHER): Payer: Medicare Other | Admitting: Pulmonary Disease

## 2015-01-30 VITALS — BP 130/78 | HR 61 | Temp 97.0°F | Ht 68.0 in | Wt 161.0 lb

## 2015-01-30 DIAGNOSIS — I10 Essential (primary) hypertension: Secondary | ICD-10-CM | POA: Diagnosis not present

## 2015-01-30 DIAGNOSIS — R911 Solitary pulmonary nodule: Secondary | ICD-10-CM | POA: Diagnosis not present

## 2015-01-30 DIAGNOSIS — Z951 Presence of aortocoronary bypass graft: Secondary | ICD-10-CM | POA: Diagnosis not present

## 2015-01-30 DIAGNOSIS — C159 Malignant neoplasm of esophagus, unspecified: Secondary | ICD-10-CM

## 2015-01-30 DIAGNOSIS — K2100 Gastro-esophageal reflux disease with esophagitis, without bleeding: Secondary | ICD-10-CM

## 2015-01-30 DIAGNOSIS — J439 Emphysema, unspecified: Secondary | ICD-10-CM

## 2015-01-30 DIAGNOSIS — I6522 Occlusion and stenosis of left carotid artery: Secondary | ICD-10-CM

## 2015-01-30 DIAGNOSIS — I739 Peripheral vascular disease, unspecified: Secondary | ICD-10-CM

## 2015-01-30 DIAGNOSIS — I2510 Atherosclerotic heart disease of native coronary artery without angina pectoris: Secondary | ICD-10-CM

## 2015-01-30 MED ORDER — TIOTROPIUM BROMIDE MONOHYDRATE 18 MCG IN CAPS: 18.0000 ug | ORAL_CAPSULE | Freq: Every day | RESPIRATORY_TRACT | Status: DC

## 2015-01-30 MED ORDER — BUDESONIDE-FORMOTEROL FUMARATE 160-4.5 MCG/ACT IN AERO: 2.0000 | INHALATION_SPRAY | Freq: Two times a day (BID) | RESPIRATORY_TRACT | Status: DC

## 2015-01-30 NOTE — Patient Instructions (Signed)
Desman- it was nice meeting you today...  We discussed your COPD/ emphysema and checked a preliminary breathing test, an oxygen saturation test, and a blood test for ALPHA-1-Antitrypsin deficiency...    We will contact you w/ the results when available...   In the meanwhile we want you to start on a regular dose of 2 diff inhalers to help preserve your lung function, minimize COPD exacerbations, & help your breathing...    Start  SYMBICORT160- 2 sprays twice daily...    Start back on Brigantine - inhale contents of 1 capsule daily via handihaler...  You may continue the Singulair, PROAIR-HFA rescue inhaler (as needed), OTC MUCINEX for any congestion, etc...  It is very important that you maintain a regular ecercise program- join the Y, consider "silver sneakers", or do it on your own every day...  Call for any questions...  Let's plan a follow up visit in 47months & we will schedule FULL PFTS w/ diffusion capacity that same day before our appt.Marland KitchenMarland Kitchen

## 2015-01-30 NOTE — Progress Notes (Addendum)
Subjective:     Patient ID: Gregory Eaton, male   DOB: [birth year], 69 y.o.   MRN: [id number]  HPI 69 y/o WM, referred by DrGolding in [place] for a pulmonary evaluation due to COPD/ emphysema/ & 7-53mm RLL nodule on CT Chest>      [name] relates a history of sinus infection/ URI in April2016 & went to his PCP DrGolding, treated w/ antibiotic & Pred=> improved but didn't get over it so he was given a second antibiotic + Spiriva, Proair, Singulair => finally improved so he tells me he stopped the Spiriva "I'm better"...  He is an ex-smoker, starting in his teens, smoked for 40 yrs up to 2ppd at max for a 55-60 pack-year smoking history... 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     He has a hx HBP, HL, CAD, s/p CABG in 1998, known carotid art dis & ASPVD w/ prev Fem-Pop bypass> followed by Manuela Neptune al & his 10/07/14 Epic note is reviewed; meds adjusted, see below...    Hx esophageal cancer w/ esophagectomy & gastric pull through at [facility] in 2010...  EXAM revealed Afeb, VSS, O2sat=95% on RA;  HEENT- neg, mallampati2;  Chest- decr BS bilat w/o w/r/r;  Heart- RR gr1/6 SEM w/o r/g;  Abd- soft, neg;  Ext- w/o c/c/e...  CT Angio Chest 04/07/14 showed neg for PE, s/p CABG, severe atherosclerosis in Ao, severe bilat emphysema, 63mm nodule RLL, inflamm changes in lingula, post surg changes related to esophagectomy & gastric pull thru, no signif LNs...  EKG 09/2014 showed NSR, rate64, RBBB, NAD...  CT Chest 11/06/14 at [facility] showed COPD/emphysema, unchanged 54mm RLL nodule, no new lesions, post-op changes after esophagectomy & gastric pull thru...  CXR 12/11/14 showed norm heart size, s/p CABG, COPD/ emphysema, NAD...   Spirometry 01/30/15 showed FVC=3.45 (82%), FEV1=1.76 (54%), %1sec=51, mid-flows reduced at 23%  predicted;  Mod severe airflow obstruction w/ GOLD Stage 2-3 COPD....   Ambulatory oxygen saturation test 01/30/15> O2sat=96% on RA at rest w/ pulse=67/min;  He ambulated 3 laps in the office w/ nadir O2sat=93% w/ pulse=85/min...  LABS 7/16:  Alpha-1-Antitrypsin level ==> 133 (83-199 mg/dL) and phenotype= M1M2  IMP/PLAN>>  Stratton has mod severe COPD/ emphysema based on his PFTs and heaqvy smoking hx; he would benefit from ICS/LABA and LAMA Rx=> rec to use SYMBICORT160-2spBid & SPIRIVA daily; he will continue PROAIR-HFA for acute symptoms prn, SINGULAIR10, and MUCINEX $RemoveBef'600mg'GhMhidDPTo$ Qid for thick phlegm/ mucus;; he needs to maintain a vigorous antireflux regimen w/ his esoph dis & surg; as much as poss he will avoid infections and needs to start a vigorous exercise program (eg- silver sneakers or similar)... Prognosis is guarded w/ his signif co-morbidities (ASHD, ASPVD, Hx esoph ca w/ surg 2010, etc);  He will ret in 63mo w/ FullPFTs to check DLCO...    Past Medical History  Diagnosis Date  . Esophageal carcinoma 03/2009    T1N1M0  . Hypertension >> on Metoprolol, Amlodipine, Losartan   . Barrett's esophagus   . GERD (gastroesophageal reflux disease) >> on Protonix40 & Carafate 1gmQid   . CAD (coronary artery disease)   . PVD (peripheral vascular disease)   . Hyperlipidemia >> on Fish Oil + diet   . Adenomatous colon polyp 03/2009    Last colonoscopy  by Dr. [name]   . Diverticulosis   . Tobacco abuse   . Tachyarrhythmia 1999    Status post ablation at [facility]  . COPD (chronic obstructive pulmonary disease)     Severe emphysema per CT  . History of echocardiogram 08/27/2009    EF >55%  . History of nuclear stress test 11/24/2011    lexiscan; normal perfusion; low risk scan; non-diagnostic for ischemia  . History of Doppler ultrasound 11/09/2011    03/2014- 50-69% L ICA stenosis;carotid doppler; L bulb/prox ICA 0-49% diameter reduction; L vertebral artery - occlusive ds; L ECA  demonstrates severe amount of  fibrous plaque  . History of Doppler ultrasound 11/09/11    LEAs; R ABI - mod art. insuff.; L ABI normal at rest; R SFA - occlusive ds; L SFA - occlusive ds; patent fem-pop graft  . Adenomatous polyp of colon 11/03/2010  . Left carotid artery stenosis 04/08/2014  . Pulmonary nodule, right 04/08/2014    8 mm-incidental finding on CT  . DM type 2 (diabetes mellitus, type 2) >> on diet alone 04/09/2014    A1c 6.5 (04/08/14)     Past Surgical History  Procedure Laterality Date  . Coronary artery bypass graft  1998    [name]  . Femoral-popliteal bypass graft  1993    occlusive ds in R SFA  . Lipoma excision  2010  . Esophagectomy  2010    [facility] Dr. [name]  . Gastric pull through  2010    With esophagectomy    Outpatient Encounter Prescriptions as of 01/30/2015  Medication Sig  . ALPRAZolam (XANAX) 0.5 MG tablet Take 1 tablet (0.5 mg total) by mouth at bedtime.  Marland Kitchen amLODipine (NORVASC) 5 MG tablet TAKE ONE (1) TABLET BY MOUTH EVERY DAY  . aspirin 81 MG EC tablet Take 81 mg by mouth daily.    . brimonidine (ALPHAGAN) 0.2 % ophthalmic solution Place 1 drop into the left eye 4 (four) times daily.  . clopidogrel (PLAVIX) 75 MG tablet TAKE ONE (1) TABLET EACH DAY  . CRESTOR 10 MG tablet TAKE ONE (1) TABLET BY MOUTH EVERY DAY  . dorzolamide-timolol (COSOPT) 22.3-6.8 MG/ML ophthalmic solution Place 1 drop into the left eye 4 (four) times daily.  Marland Kitchen ketorolac (ACULAR) 0.5 % ophthalmic solution Place 1 drop into the left eye 4 (four) times daily.  Marland Kitchen latanoprost (XALATAN) 0.005 % ophthalmic solution Place 1 drop into both eyes at bedtime.   Marland Kitchen losartan (COZAAR) 50 MG tablet Take 1.5 tablets (75 mg total) by mouth daily.  . metoprolol succinate (TOPROL-XL) 50 MG 24 hr tablet Take 1 tablet (50 mg total) by mouth daily. Take with or immediately following a meal.  . montelukast (SINGULAIR) 10 MG tablet Take 1 tablet by mouth daily.  . Multiple Vitamins-Minerals (CENTRUM SILVER PO) Take 1 tablet  by mouth daily.    . nitroGLYCERIN (NITROSTAT) 0.4 MG SL tablet Place 1 tablet (0.4 mg total) under the tongue every 5 (five) minutes as needed for chest pain.  Marland Kitchen ofloxacin (OCUFLOX) 0.3 % ophthalmic solution Place 1 drop into the left eye daily.  . pantoprazole (PROTONIX) 40 MG tablet Take 1 tablet (40 mg total) by mouth daily.  . prednisoLONE acetate (PRED FORTE) 1 % ophthalmic suspension Place 1 drop into the left eye 4 (four) times daily.  . sucralfate (CARAFATE) 1 G tablet Take 1 tablet (1 g total) by mouth 4 (four) times daily -  with meals and at bedtime.  . timolol (  TIMOPTIC) 0.5 % ophthalmic solution Place 1 drop into both eyes daily.   Marland Kitchen tiZANidine (ZANAFLEX) 4 MG tablet Take 4 mg by mouth 3 (three) times daily as needed. Muscle spasm   No facility-administered encounter medications on file as of 01/30/2015.    Allergies  Allergen Reactions  . Altace [Ramipril] Cough  . Prednisone Other (See Comments)    "throat broke out"     Family History  Problem Relation Age of Onset  . GER disease Mother   . Coronary artery disease Brother   . Congenital heart disease Sister     History   Social History  . Marital Status: Married    Spouse Name: N/A  . Number of Children: 2  . Years of Education: N/A   Occupational History  . retired     Administrator   Social History Main Topics  . Smoking status: Former Smoker -- 2.00 packs/day for 40 years    Types: Cigarettes    Quit date: 07/20/1991  . Smokeless tobacco: Never Used  . Alcohol Use: No     Comment: occassional  . Drug Use: No  . Sexual Activity:    Partners: Female    Patent examiner Protection: Condom     Comment: friend   Other Topics Concern  . Not on file   Social History Narrative    Current Medications, Allergies, Past Medical History, Past Surgical History, Family History, and Social History were reviewed in [employer] record.   Review of Systems            All symptoms NEG  except where BOLDED >>  Constitutional:  F/C/S, fatigue, anorexia, unexpected weight change. HEENT:  HA, visual changes, hearing loss, earache, nasal symptoms, sore throat, mouth sores, hoarseness. Resp:  cough, sputum, hemoptysis; SOB, tightness, wheezing. Cardio:  CP, palpit, DOE, orthopnea, edema. GI:  N/V/D/C, blood in stool; reflux, abd pain, distention, gas. GU:  dysuria, freq, urgency, hematuria, flank pain, voiding difficulty. MS:  joint pain, swelling, tenderness, decr ROM; neck pain, back pain, etc. Neuro:  HA, tremors, seizures, dizziness, syncope, weakness, numbness, gait abn. Skin:  suspicious lesions or skin rash. Heme:  adenopathy, bruising, bleeding. Psyche:  confusion, agitation, sleep disturbance, hallucinations, anxiety, depression suicidal.   Objective:   Physical Exam      Vital Signs:  Reviewed...  General:  WD, WN, 69 y/o WM in NAD; alert & oriented; pleasant & cooperative... HEENT:  [place]/AT; Conjunctiva- pink, Sclera- nonicteric, EOM-wnl, PERRLA, EACs-clear, TMs-wnl; NOSE-clear; THROAT-clear & wnl. Neck:  Supple w/ fair ROM; no JVD; normal carotid impulses w/o bruits; no thyromegaly or nodules palpated; no lymphadenopathy. Chest:  decr BS bilat, without wheezes, rales, or rhonchi heard. Heart:  Regular Rhythm; gr1/6 SEM w/o rubs or gallops detected. Abdomen:  Soft & nontender- no guarding or rebound; normal bowel sounds; no organomegaly or masses palpated. Ext:  decrROM; without deformities +arthritic changes; no varicose veins, +venous insuffic, or edema;  Pulses intact w/o bruits. Neuro:  No focal neuro deficits; sensory testing normal; gait normal & balance OK. Derm:  No lesions noted; no rash etc. Lymph:  No cervical, supraclavicular, axillary, or inguinal adenopathy palpated.   Assessment:      IMP >>  COPD/ emphysema ==> GOLD Stage 2-3 disease 7-13mm RLL pulmonary nodule on CT Chest HBP CAD- s/p 5 vessel CABG Carotid stenosis ASPVD Hx esophageal  cancer- s/p esophagectomy w/ gastric pull through at [facility] in 2010 Other medical issues as noted...   PLAN >>  Jibril has mod severe COPD/ emphysema based on his PFTs and heaqvy smoking hx; he would benefit from ICS/LABA and LAMA Rx=> rec to use SYMBICORT160-2spBid & SPIRIVA daily; he will continue PROAIR-HFA for acute symptoms prn, SINGULAIR10, and MUCINEX $RemoveBef'600mg'hjfcXukMnq$ Qid for thick phlegm/ mucus;; he needs to maintain a vigorous antireflux regimen w/ his esoph dis & surg; as much as poss he will avoid infections and needs to start a vigorous exercise program (eg- silver sneakers or similar)... Prognosis is guarded w/ his signif co-morbidities (ASHD, ASPVD, Hx esoph ca w/ surg 2010, etc);  He will ret in 50mo w/ FullPFTs to check DLCO.     Plan:     Patient's Medications  New Prescriptions   BUDESONIDE-FORMOTEROL (SYMBICORT) 160-4.5 MCG/ACT INHALER    Inhale 2 puffs into the lungs 2 (two) times daily.   TIOTROPIUM (SPIRIVA HANDIHALER) 18 MCG INHALATION CAPSULE    Place 1 capsule (18 mcg total) into inhaler and inhale daily.  Previous Medications   ALPRAZOLAM (XANAX) 0.5 MG TABLET    Take 1 tablet (0.5 mg total) by mouth at bedtime.   AMLODIPINE (NORVASC) 5 MG TABLET    TAKE ONE (1) TABLET BY MOUTH EVERY DAY   ASPIRIN 81 MG EC TABLET    Take 81 mg by mouth daily.     CLOPIDOGREL (PLAVIX) 75 MG TABLET    TAKE ONE (1) TABLET EACH DAY   CRESTOR 10 MG TABLET    TAKE ONE (1) TABLET BY MOUTH EVERY DAY   LATANOPROST (XALATAN) 0.005 % OPHTHALMIC SOLUTION    Place 1 drop into both eyes at bedtime.    LOSARTAN (COZAAR) 50 MG TABLET    Take 1.5 tablets (75 mg total) by mouth daily.   METOPROLOL SUCCINATE (TOPROL-XL) 50 MG 24 HR TABLET    Take 1 tablet (50 mg total) by mouth daily. Take with or immediately following a meal.   MONTELUKAST (SINGULAIR) 10 MG TABLET    Take 1 tablet by mouth daily.   MULTIPLE VITAMINS-MINERALS (CENTRUM SILVER PO)    Take 1 tablet by mouth daily.     NITROGLYCERIN (NITROSTAT) 0.4 MG SL  TABLET    Place 1 tablet (0.4 mg total) under the tongue every 5 (five) minutes as needed for chest pain.   OFLOXACIN (OCUFLOX) 0.3 % OPHTHALMIC SOLUTION    Place 1 drop into the left eye daily.   PANTOPRAZOLE (PROTONIX) 40 MG TABLET    Take 1 tablet (40 mg total) by mouth daily.   SUCRALFATE (CARAFATE) 1 G TABLET    Take 1 tablet (1 g total) by mouth 4 (four) times daily -  with meals and at bedtime.   TIMOLOL (TIMOPTIC) 0.5 % OPHTHALMIC SOLUTION    Place 1 drop into both eyes daily.    TIZANIDINE (ZANAFLEX) 4 MG TABLET    Take 4 mg by mouth 3 (three) times daily as needed. Muscle spasm  Modified Medications   No medications on file  Discontinued Medications   BRIMONIDINE (ALPHAGAN) 0.2 % OPHTHALMIC SOLUTION    Place 1 drop into the left eye 4 (four) times daily.   DORZOLAMIDE-TIMOLOL (COSOPT) 22.3-6.8 MG/ML OPHTHALMIC SOLUTION    Place 1 drop into the left eye 4 (four) times daily.   KETOROLAC (ACULAR) 0.5 % OPHTHALMIC SOLUTION    Place 1 drop into the left eye 4 (four) times daily.   PREDNISOLONE ACETATE (PRED FORTE) 1 % OPHTHALMIC SUSPENSION    Place 1 drop into the left eye 4 (four) times daily.

## 2015-02-06 LAB — ALPHA-1 ANTITRYPSIN PHENOTYPE: A1 ANTITRYPSIN: 133 mg/dL (ref 83–199)

## 2015-02-10 DIAGNOSIS — H4011X1 Primary open-angle glaucoma, mild stage: Secondary | ICD-10-CM | POA: Diagnosis not present

## 2015-02-10 DIAGNOSIS — H4011X3 Primary open-angle glaucoma, severe stage: Secondary | ICD-10-CM | POA: Diagnosis not present

## 2015-03-12 ENCOUNTER — Telehealth: Payer: Self-pay | Admitting: Cardiovascular Disease

## 2015-03-12 NOTE — Telephone Encounter (Signed)
Per Answering Service: He needs to speak to the nurse about his medications.

## 2015-03-12 NOTE — Telephone Encounter (Signed)
Patient is in the donut hole and can't afford trade, would like generic Crestor.  Crestor generic called in to [facility].

## 2015-03-13 NOTE — Telephone Encounter (Signed)
ok 

## 2015-03-17 ENCOUNTER — Telehealth: Payer: Self-pay | Admitting: Pulmonary Disease

## 2015-03-17 MED ORDER — FLUTICASONE-SALMETEROL 115-21 MCG/ACT IN AERO: 2.0000 | INHALATION_SPRAY | Freq: Two times a day (BID) | RESPIRATORY_TRACT | Status: DC

## 2015-03-17 NOTE — Telephone Encounter (Signed)
Rx sent to pharmacy. Patient notified. Patient will contact insurance company to get formulary and will bring at next OV Nothing further needed.

## 2015-03-17 NOTE — Telephone Encounter (Signed)
Patient says that Dr. Lenna Gilford gave him a prescription for Symbicort. It costs $145.00.   Patient would like another medication called in that is cheaper.    Allergies  Allergen Reactions  . Altace [Ramipril] Cough  . Prednisone Other (See Comments)    "throat broke out"    Current Outpatient Prescriptions on File Prior to Visit  Medication Sig Dispense Refill  . ALPRAZolam (XANAX) 0.5 MG tablet Take 1 tablet (0.5 mg total) by mouth at bedtime. 30 tablet 0  . amLODipine (NORVASC) 5 MG tablet TAKE ONE (1) TABLET BY MOUTH EVERY DAY 90 tablet 2  . aspirin 81 MG EC tablet Take 81 mg by mouth daily.      . budesonide-formoterol (SYMBICORT) 160-4.5 MCG/ACT inhaler Inhale 2 puffs into the lungs 2 (two) times daily. 1 Inhaler 12  . clopidogrel (PLAVIX) 75 MG tablet TAKE ONE (1) TABLET EACH DAY 30 tablet 11  . CRESTOR 10 MG tablet TAKE ONE (1) TABLET BY MOUTH EVERY DAY 90 tablet 2  . latanoprost (XALATAN) 0.005 % ophthalmic solution Place 1 drop into both eyes at bedtime.     Marland Kitchen losartan (COZAAR) 50 MG tablet Take 1.5 tablets (75 mg total) by mouth daily. 45 tablet 6  . metoprolol succinate (TOPROL-XL) 50 MG 24 hr tablet Take 1 tablet (50 mg total) by mouth daily. Take with or immediately following a meal. 30 tablet 11  . montelukast (SINGULAIR) 10 MG tablet Take 1 tablet by mouth daily.    . Multiple Vitamins-Minerals (CENTRUM SILVER PO) Take 1 tablet by mouth daily.      . nitroGLYCERIN (NITROSTAT) 0.4 MG SL tablet Place 1 tablet (0.4 mg total) under the tongue every 5 (five) minutes as needed for chest pain. 25 tablet 3  . ofloxacin (OCUFLOX) 0.3 % ophthalmic solution Place 1 drop into the left eye daily.    . pantoprazole (PROTONIX) 40 MG tablet Take 1 tablet (40 mg total) by mouth daily. 30 tablet 11  . sucralfate (CARAFATE) 1 G tablet Take 1 tablet (1 g total) by mouth 4 (four) times daily -  with meals and at bedtime. 40 tablet 0  . timolol (TIMOPTIC) 0.5 % ophthalmic solution Place 1 drop into  both eyes daily.     Marland Kitchen tiotropium (SPIRIVA HANDIHALER) 18 MCG inhalation capsule Place 1 capsule (18 mcg total) into inhaler and inhale daily. 30 capsule 12  . tiZANidine (ZANAFLEX) 4 MG tablet Take 4 mg by mouth 3 (three) times daily as needed. Muscle spasm     No current facility-administered medications on file prior to visit.    Dr. Lenna Gilford, please advise.

## 2015-03-17 NOTE — Telephone Encounter (Signed)
Per SN,  Call in [place] 115/21 with instructions of 2 puffs BID Advise pt to check with his insurance company and request RX drug formulary and bring to next visit for review

## 2015-03-18 ENCOUNTER — Telehealth: Payer: Self-pay | Admitting: Pulmonary Disease

## 2015-03-18 MED ORDER — ROSUVASTATIN CALCIUM 10 MG PO TABS: ORAL_TABLET | ORAL | Status: DC

## 2015-03-18 NOTE — Telephone Encounter (Signed)
Gregory Campion, LPN at 4/53/6468 03:21 PM     Status: Signed       Expand All Collapse All   Per SN,  Call in Advair 115/21 with instructions of 2 puffs BID Advise pt to check with his insurance company and request RX drug formulary and bring to next visit for review       ----  Called spoke with pt. Made him aware to call and see what is on his formulary that would be cheapest for him and to let us know. Nothing further needed

## 2015-03-31 DIAGNOSIS — H4011X1 Primary open-angle glaucoma, mild stage: Secondary | ICD-10-CM | POA: Diagnosis not present

## 2015-04-03 ENCOUNTER — Ambulatory Visit (INDEPENDENT_AMBULATORY_CARE_PROVIDER_SITE_OTHER): Payer: Medicare Other | Admitting: Pulmonary Disease

## 2015-04-03 ENCOUNTER — Encounter: Payer: Self-pay | Admitting: Pulmonary Disease

## 2015-04-03 VITALS — BP 130/74 | HR 68 | Temp 97.7°F | Wt 159.0 lb

## 2015-04-03 DIAGNOSIS — I6522 Occlusion and stenosis of left carotid artery: Secondary | ICD-10-CM

## 2015-04-03 DIAGNOSIS — K2100 Gastro-esophageal reflux disease with esophagitis, without bleeding: Secondary | ICD-10-CM

## 2015-04-03 DIAGNOSIS — C159 Malignant neoplasm of esophagus, unspecified: Secondary | ICD-10-CM

## 2015-04-03 DIAGNOSIS — R911 Solitary pulmonary nodule: Secondary | ICD-10-CM

## 2015-04-03 DIAGNOSIS — J439 Emphysema, unspecified: Secondary | ICD-10-CM

## 2015-04-03 DIAGNOSIS — Z951 Presence of aortocoronary bypass graft: Secondary | ICD-10-CM | POA: Diagnosis not present

## 2015-04-03 DIAGNOSIS — I2510 Atherosclerotic heart disease of native coronary artery without angina pectoris: Secondary | ICD-10-CM

## 2015-04-03 DIAGNOSIS — I10 Essential (primary) hypertension: Secondary | ICD-10-CM

## 2015-04-03 LAB — PULMONARY FUNCTION TEST
DL/VA % pred: 57 %
DL/VA: 2.6 ml/min/mmHg/L
DLCO unc % pred: 55 %
DLCO unc: 17.11 ml/min/mmHg
FEF 25-75 PRE: 0.86 L/s
FEF 25-75 Post: 0.94 L/sec
FEF2575-%Change-Post: 9 %
FEF2575-%PRED-POST: 40 %
FEF2575-%Pred-Pre: 36 %
FEV1-%Change-Post: 3 %
FEV1-%PRED-POST: 72 %
FEV1-%PRED-PRE: 70 %
FEV1-PRE: 2.16 L
FEV1-Post: 2.24 L
FEV1FVC-%Change-Post: 1 %
FEV1FVC-%PRED-PRE: 69 %
FEV6-%Change-Post: 2 %
FEV6-%Pred-Post: 103 %
FEV6-%Pred-Pre: 100 %
FEV6-Post: 4.11 L
FEV6-Pre: 3.99 L
FEV6FVC-%Change-Post: 1 %
FEV6FVC-%Pred-Post: 101 %
FEV6FVC-%Pred-Pre: 100 %
FVC-%CHANGE-POST: 1 %
FVC-%Pred-Post: 102 %
FVC-%Pred-Pre: 100 %
FVC-Post: 4.3 L
FVC-Pre: 4.22 L
POST FEV1/FVC RATIO: 52 %
Post FEV6/FVC ratio: 95 %
Pre FEV1/FVC ratio: 51 %
Pre FEV6/FVC Ratio: 94 %
RV % pred: 75 %
RV: 1.79 L
TLC % PRED: 100 %
TLC: 6.8 L

## 2015-04-03 NOTE — Patient Instructions (Signed)
Today we updated your med list in our EPIC system...    Continue your current medications the same...  We reviewed your Full PFTs today & noted the improvement in your airflow on the inhalers    and the reduced diffusion capacity which tells [place] this is significant emphysema-type COPD...  We reviewed the vigorous antireflux regimen>>    Take the Protonix about 30 min before dinner in the eve...    Do not eat or drink much after dinner in the eve...'    Elevate the head of your bed as we discussed...    Try PEPCID or ZANTAC about 1H before bedtime w/ a sip of water...  Be sure to get your 2016 Flu vaccine and be sure you are up to date on your pneumonia shots etc...  Call for any questions...  Let's plan a follow up visit in 6mo, sooner if needed for problems.Marland KitchenMarland Kitchen

## 2015-04-03 NOTE — Progress Notes (Signed)
Subjective:     Patient ID: Gregory Eaton, male   DOB: 1946/01/28, 69 y.o.   MRN: [id number]  HPI ~  January 30, 2015:  Initial pulmonary consult w/ SN>   64 y/o WM, [place]Golding [place] for a pulmonary evaluation due to COPD/ emphysema/ & 7-56m RLL nodule on CT Chest>      CMychaelrelates a history of sinus infection/ URI in April2016 & went to his PCP DrGolding, treated w/ antibiotic & Pred=> improved but didn't get over it so he was given a second antibiotic + Spiriva, Proair, Singulair => finally improved so he tells me he stopped the Spiriva "I'm better"...  He is an ex-smoker, starting in his teens, smoked for 40 yrs up to 2ppd at max for a 55-60 pack-year smoking history... 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     He has a hx HBP, HL, CAD, s/p CABG in 1998, known carotid art dis & ASPVD w/ prev Fem-Pop bypass> followed by DrManuela Neptunel & his 10/07/14 Epic note is reviewed; meds adjusted, see below...    [facility]/ [facility]... EXAM revealed Afeb, VSS, O2sat=95% on RA;  HEENT- neg, mallampati2;  Chest- decr BS bilat w/o w/r/r;  Heart- RR gr1/6 SEM w/o r/g;  Abd- soft, neg;  Ext- w/o c/c/e...  CT Angio Chest 04/07/14 showed neg for PE, s/p CABG, severe atherosclerosis in Ao, severe bilat emphysema, [id number] RLL, inflamm changes in lingula, post surg changes related to esophagectomy & gastric pull thru, no signif LNs...  EKG 09/2014 showed NSR, rate64, RBBB, NAD...  [facility], unchanged 7mm24mL nodule, no new lesions, post-op changes after esophagectomy & gastric pull thru...  CXR 12/11/14 showed norm heart size, s/p CABG, COPD/ emphysema, NAD...   Spirometry 01/30/15 showed FVC=3.45 (82%),  FEV1=1.76 (54%), %1sec=51, mid-flows reduced at 23% predicted;  Mod severe airflow obstruction w/ GOLD Stage 2-3 COPD....   Ambulatory oxygen saturation test 01/30/15> O2sat=96% on RA at rest w/ pulse=67/min;  He ambulated 3 laps in the office w/ nadir O2sat=93% w/ pulse=85/min...  LABS 7/16:  Alpha-1-Antitrypsin level ==> 133 (83-199 mg/dL) and phenotype= M1M2  IMP/PLAN>>  CharDavine mod severe COPD/ emphysema based on his PFTs and heavy smoking hx; he would benefit from ICS/LABA and LAMA Rx=> rec to use SYMBICORT160-2spBid & SPIRIVA daily; he will continue PROAIR-HFA for acute symptoms prn, SINGULAIR10, and MUCINEX '600mg'$ Qid for thick phlegm/ mucus; he needs to maintain a vigorous antireflux regimen w/ his esoph dis & surg; as much as poss he will avoid infections and needs to start a vigorous exercise program (eg- silver sneakers or similar)... Prognosis is guarded w/ his signif co-morbidities (ASHD, ASPVD, Hx esoph ca w/ surg 2010, etc);  He will ret in 334mo 37moullPFTs to check DLCO...   ~  April 03, 2015:  334mo R46mo/ SN>         CharleAndersenns for f/u on his Advair115-2spBid, Spiriva daily, Singulair10, and ProairHFA prn;  He has had some trouble w/ cost of the meds and asked to get a copy of his insurance company prescription drug formulary for us to [place];  He notes that his breathing is  stable overall- still mows yard & does ok, not much cough/ sput/ no hemoptysis/ no CP/ no edema;  He has some reflux issues on Protonix40 & reminded of the vigorous antireflux regimen he needs to follow including NPO after dinner, elev HOB 6", take Pepcid ~12min before bedtime, etc... We reviewed the following medical problems during today's office visit >>     COPD/ emphysema ==> GOLD Stage 2-3 disease>  Continue Advair115-2spBid, Spiriva daily, Singulair10, ProairHFA prn; get [place] a copy of his prescription drug formulary...    7-51mm RLL pulmonary nodule on CT Chest>  No change noted from 2015=>2016 & we will  repeat scan in spring of 2017...    HBP>  Followed by DrGolding in [place] on ToprolXL50, Amlod5, Cozaar50; BP=130/74 & he denies CP, palpit, SOB, edema...    CAD- s/p 5 vessel CABG>  On ASA/ Plavix; followed by [facility] for Cards...    Carotid stenosis>      ASPVD>      Hx esophageal cancer- s/p esophagectomy w/ gastric pull through at [facility] in 2010>  His GI is DrRourk, Designer, multimedia GI and his Careers adviser was done at [facility] in 2010 but no CareEverywhere records are avail...    Other medical issues as noted>  See below... EXAM revealed Afeb, VSS, O2sat=97% on RA;  HEENT- neg, mallampati2;  Chest- decr BS bilat w/o w/r/r;  Heart- RR gr1/6 SEM w/o r/g;  Abd- soft, neg;  Ext- w/o c/c/e...  FullPFT 04/03/15 showed FVC=4.22 (100%), FEV1=2.16 (70%), %1sec=51, mid-flows reduced at36% predicted; post-bronchodil there was a 3% incr in FEV1; TLC=6.80 (100%), RV=1.79 (75%), RV/TLC=26; DLCO=55% predicted... These PFTs indicate moderately severe airflow obstruction, GOLD Stage2-3 COPD, decr diffusion c/w emphysema...  IMP/PLAN>>  As prev noted- Mikle is rectostayon the Advair,Spiriva, Proair; avoid infections, get all approp vaccinations from his PCP; follow the vigorous antireflux regimen as outlined, and plan f/u w/ [place] in about 54mo, sooner if needed...     Past Medical History  Diagnosis Date  . Esophageal carcinoma 03/2009    T1N1M0  . Hypertension >> on Metoprolol, Amlodipine, Losartan   . Barrett's esophagus   . GERD (gastroesophageal reflux disease) >> on Protonix40 & Carafate 1gmQid   . CAD (coronary artery disease)   . PVD (peripheral vascular disease)   . Hyperlipidemia >> on Crestor10, Fish Oil + diet   . Adenomatous colon polyp 03/2009    Last colonoscopy by Dr. Jena Gauss   . Diverticulosis   . Tobacco abuse   . Tachyarrhythmia 1999    Status post ablation at [facility]  . COPD (chronic obstructive pulmonary disease)     Severe emphysema per CT  . History of echocardiogram 08/27/2009    EF >55%  . History  of nuclear stress test 11/24/2011    lexiscan; normal perfusion; low risk scan; non-diagnostic for ischemia  . History of Doppler ultrasound 11/09/2011    03/2014- 50-69% L ICA stenosis;carotid doppler; L bulb/prox ICA 0-49% diameter reduction; L vertebral artery - occlusive ds; L ECA  demonstrates severe amount of fibrous plaque  . History of Doppler ultrasound 11/09/11    LEAs; R ABI - mod art. insuff.; L ABI normal at rest; R SFA - occlusive ds; L SFA - occlusive ds; patent fem-pop graft  . Adenomatous polyp of colon 11/03/2010  . Left carotid artery stenosis 04/08/2014  . Pulmonary nodule, right 04/08/2014    8 mm-incidental finding on CT  . DM type 2 (diabetes mellitus, type 2) >> on diet alone 04/09/2014  A1c 6.5 (04/08/14)     Past Surgical History  Procedure Laterality Date  . Coronary artery bypass graft  1998    [name]  . Femoral-popliteal bypass graft  1993    occlusive ds in R SFA  . Lipoma excision  2010  . Esophagectomy  2010    [facility] Dr. [name]  . Gastric pull through  2010    With esophagectomy    Outpatient Encounter Prescriptions as of 04/03/2015  Medication Sig  . ALPRAZolam (XANAX) 0.5 MG tablet Take 1 tablet (0.5 mg total) by mouth at bedtime.  Marland Kitchen amLODipine (NORVASC) 5 MG tablet TAKE ONE (1) TABLET BY MOUTH EVERY DAY  . aspirin 81 MG EC tablet Take 81 mg by mouth daily.    . clopidogrel (PLAVIX) 75 MG tablet TAKE ONE (1) TABLET EACH DAY  . fluticasone-salmeterol (ADVAIR HFA) 115-21 MCG/ACT inhaler Inhale 2 puffs into the lungs 2 (two) times daily.  Marland Kitchen latanoprost (XALATAN) 0.005 % ophthalmic solution Place 1 drop into both eyes at bedtime.   Marland Kitchen losartan (COZAAR) 50 MG tablet Take 1.5 tablets (75 mg total) by mouth daily.  . metoprolol succinate (TOPROL-XL) 50 MG 24 hr tablet Take 1 tablet (50 mg total) by mouth daily. Take with or immediately following a meal.  . montelukast (SINGULAIR) 10 MG tablet Take 1 tablet by mouth daily.  . Multiple  Vitamins-Minerals (CENTRUM SILVER PO) Take 1 tablet by mouth daily.    . nitroGLYCERIN (NITROSTAT) 0.4 MG SL tablet Place 1 tablet (0.4 mg total) under the tongue every 5 (five) minutes as needed for chest pain.  Marland Kitchen ofloxacin (OCUFLOX) 0.3 % ophthalmic solution Place 1 drop into the left eye daily.  . pantoprazole (PROTONIX) 40 MG tablet Take 1 tablet (40 mg total) by mouth daily.  . rosuvastatin (CRESTOR) 10 MG tablet TAKE ONE (1) TABLET BY MOUTH EVERY DAY  . sucralfate (CARAFATE) 1 G tablet Take 1 tablet (1 g total) by mouth 4 (four) times daily -  with meals and at bedtime.  . timolol (TIMOPTIC) 0.5 % ophthalmic solution Place 1 drop into both eyes daily.   Marland Kitchen tiotropium (SPIRIVA HANDIHALER) 18 MCG inhalation capsule Place 1 capsule (18 mcg total) into inhaler and inhale daily.  Marland Kitchen tiZANidine (ZANAFLEX) 4 MG tablet Take 4 mg by mouth 3 (three) times daily as needed. Muscle spasm   No facility-administered encounter medications on file as of 04/03/2015.    Allergies  Allergen Reactions  . Altace [Ramipril] Cough  . Prednisone Other (See Comments)    "throat broke out"     Current Medications, Allergies, Past Medical History, Past Surgical History, Family History, and Social History were reviewed in [employer] record.   Review of Systems            All symptoms NEG except where BOLDED >>  Constitutional:  F/C/S, fatigue, anorexia, unexpected weight change. HEENT:  HA, visual changes, hearing loss, earache, nasal symptoms, sore throat, mouth sores, hoarseness. Resp:  cough, sputum, hemoptysis; SOB, tightness, wheezing. Cardio:  CP, palpit, DOE, orthopnea, edema. GI:  N/V/D/C, blood in stool; reflux, abd pain, distention, gas. GU:  dysuria, freq, urgency, hematuria, flank pain, voiding difficulty. MS:  joint pain, swelling, tenderness, decr ROM; neck pain, back pain, etc. Neuro:  HA, tremors, seizures, dizziness, syncope, weakness, numbness, gait abn. Skin:   suspicious lesions or skin rash. Heme:  adenopathy, bruising, bleeding. Psyche:  confusion, agitation, sleep disturbance, hallucinations, anxiety, depression suicidal.   Objective:  Physical Exam      Vital Signs:  Reviewed...  General:  WD, WN, 69 y/o WM in NAD; alert & oriented; pleasant & cooperative... HEENT:  [place]/AT; Conjunctiva- pink, Sclera- nonicteric, EOM-wnl, PERRLA, EACs-clear, TMs-wnl; NOSE-clear; THROAT-clear & wnl. Neck:  Supple w/ fair ROM; no JVD; normal carotid impulses w/o bruits; no thyromegaly or nodules palpated; no lymphadenopathy. Chest:  decr BS bilat, without wheezes, rales, or rhonchi heard. Heart:  Regular Rhythm; gr1/6 SEM w/o rubs or gallops detected. Abdomen:  Soft & nontender- no guarding or rebound; normal bowel sounds; no organomegaly or masses palpated. Ext:  decrROM; without deformities +arthritic changes; no varicose veins, +venous insuffic, or edema;  Pulses intact w/o bruits. Neuro:  No focal neuro deficits; sensory testing normal; gait normal & balance OK. Derm:  No lesions noted; no rash etc. Lymph:  No cervical, supraclavicular, axillary, or inguinal adenopathy palpated.   Assessment:      IMP >>     COPD/ emphysema ==> GOLD Stage 2-3 disease    7-46mm RLL pulmonary nodule on CT Chest    HBP    CAD- s/p 5 vessel CABG    Carotid stenosis    ASPVD    Hx esophageal cancer- s/p esophagectomy w/ gastric pull through at [facility] in 2010    Other medical issues as noted...   PLAN >>  7/14>  Demarqus has mod severe COPD/ emphysema based on his PFTs and heaqvy smoking hx; he would benefit from ICS/LABA and LAMA Rx=> rec to use SYMBICORT160-2spBid & SPIRIVA daily; he will continue PROAIR-HFA for acute symptoms prn, SINGULAIR10, and MUCINEX $RemoveBef'600mg'qiLLYRbZYI$ Qid for thick phlegm/ mucus;; he needs to maintain a vigorous antireflux regimen w/ his esoph dis & surg; as much as poss he will avoid infections and needs to start a vigorous exercise program (eg- silver sneakers or  similar)... Prognosis is guarded w/ his signif co-morbidities (ASHD, ASPVD, Hx esoph ca w/ surg 2010, etc);  He will ret in 33mo w/ FullPFTs to check DLCO. 9/15>  As prev noted- Elston is rectostayon the Advair,Spiriva, Proair; avoid infections, get all approp vaccinations from his PCP; follow the vigorous antireflux regimen as outlined, and plan f/u w/ [place] in about 62mo, sooner if needed.     Plan:       Patient's Medications  New Prescriptions   No medications on file  Previous Medications   ALPRAZOLAM (XANAX) 0.5 MG TABLET    Take 1 tablet (0.5 mg total) by mouth at bedtime.   AMLODIPINE (NORVASC) 5 MG TABLET    TAKE ONE (1) TABLET BY MOUTH EVERY DAY   ASPIRIN 81 MG EC TABLET    Take 81 mg by mouth daily.     CLOPIDOGREL (PLAVIX) 75 MG TABLET    TAKE ONE (1) TABLET EACH DAY   FLUTICASONE-SALMETEROL (ADVAIR HFA) 115-21 MCG/ACT INHALER    Inhale 2 puffs into the lungs 2 (two) times daily.   LATANOPROST (XALATAN) 0.005 % OPHTHALMIC SOLUTION    Place 1 drop into both eyes at bedtime.    LOSARTAN (COZAAR) 50 MG TABLET    Take 1.5 tablets (75 mg total) by mouth daily.   METOPROLOL SUCCINATE (TOPROL-XL) 50 MG 24 HR TABLET    Take 1 tablet (50 mg total) by mouth daily. Take with or immediately following a meal.   MONTELUKAST (SINGULAIR) 10 MG TABLET    Take 1 tablet by mouth daily.   MULTIPLE VITAMINS-MINERALS (CENTRUM SILVER PO)    Take 1 tablet by mouth daily.  NITROGLYCERIN (NITROSTAT) 0.4 MG SL TABLET    Place 1 tablet (0.4 mg total) under the tongue every 5 (five) minutes as needed for chest pain.   OFLOXACIN (OCUFLOX) 0.3 % OPHTHALMIC SOLUTION    Place 1 drop into the left eye daily.   PANTOPRAZOLE (PROTONIX) 40 MG TABLET    Take 1 tablet (40 mg total) by mouth daily.   ROSUVASTATIN (CRESTOR) 10 MG TABLET    TAKE ONE (1) TABLET BY MOUTH EVERY DAY   SUCRALFATE (CARAFATE) 1 G TABLET    Take 1 tablet (1 g total) by mouth 4 (four) times daily -  with meals and at bedtime.   TIMOLOL (TIMOPTIC)  0.5 % OPHTHALMIC SOLUTION    Place 1 drop into both eyes daily.    TIOTROPIUM (SPIRIVA HANDIHALER) 18 MCG INHALATION CAPSULE    Place 1 capsule (18 mcg total) into inhaler and inhale daily.   TIZANIDINE (ZANAFLEX) 4 MG TABLET    Take 4 mg by mouth 3 (three) times daily as needed. Muscle spasm  Modified Medications   No medications on file  Discontinued Medications   No medications on file

## 2015-04-03 NOTE — Progress Notes (Signed)
PFT done today. 

## 2015-04-11 DIAGNOSIS — I10 Essential (primary) hypertension: Secondary | ICD-10-CM | POA: Diagnosis not present

## 2015-04-11 DIAGNOSIS — I2510 Atherosclerotic heart disease of native coronary artery without angina pectoris: Secondary | ICD-10-CM | POA: Diagnosis not present

## 2015-04-11 DIAGNOSIS — Z125 Encounter for screening for malignant neoplasm of prostate: Secondary | ICD-10-CM | POA: Diagnosis not present

## 2015-04-11 DIAGNOSIS — Z23 Encounter for immunization: Secondary | ICD-10-CM | POA: Diagnosis not present

## 2015-04-11 DIAGNOSIS — R7309 Other abnormal glucose: Secondary | ICD-10-CM | POA: Diagnosis not present

## 2015-04-11 DIAGNOSIS — Z6824 Body mass index (BMI) 24.0-24.9, adult: Secondary | ICD-10-CM | POA: Diagnosis not present

## 2015-04-11 DIAGNOSIS — Z1389 Encounter for screening for other disorder: Secondary | ICD-10-CM | POA: Diagnosis not present

## 2015-04-11 DIAGNOSIS — E782 Mixed hyperlipidemia: Secondary | ICD-10-CM | POA: Diagnosis not present

## 2015-05-16 ENCOUNTER — Other Ambulatory Visit: Payer: Self-pay | Admitting: Cardiovascular Disease

## 2015-05-22 DIAGNOSIS — H401131 Primary open-angle glaucoma, bilateral, mild stage: Secondary | ICD-10-CM | POA: Diagnosis not present

## 2015-05-27 ENCOUNTER — Encounter: Payer: Self-pay | Admitting: Cardiovascular Disease

## 2015-05-28 ENCOUNTER — Encounter: Payer: Self-pay | Admitting: Cardiovascular Disease

## 2015-05-28 ENCOUNTER — Ambulatory Visit (INDEPENDENT_AMBULATORY_CARE_PROVIDER_SITE_OTHER): Payer: Medicare Other | Admitting: Cardiovascular Disease

## 2015-05-28 VITALS — BP 122/72 | HR 69 | Ht 68.0 in | Wt 160.5 lb

## 2015-05-28 DIAGNOSIS — I25810 Atherosclerosis of coronary artery bypass graft(s) without angina pectoris: Secondary | ICD-10-CM

## 2015-05-28 DIAGNOSIS — I6522 Occlusion and stenosis of left carotid artery: Secondary | ICD-10-CM

## 2015-05-28 NOTE — Patient Instructions (Addendum)
Your physician wants you to follow-up in: 1 year or sooner if needed. You will receive a reminder letter in the mail two months in advance. If you don't receive a letter, please call our office to schedule the follow-up appointment.   If you need a refill on your cardiac medications before your next appointment, please call your pharmacy.   

## 2015-05-28 NOTE — Progress Notes (Signed)
Patient ID: Gregory Eaton, male   DOB: 18-Dec-1945, 69 y.o.   MRN: [id number]    HPI: Gregory Eaton is a 69 y.o. male presents to the office today for an 8 month cardiology evaluation.  Gregory Eaton has known CAD and underwent CABG revascularization surgery by Dr. Nils Eaton in 1998. In December 2010 he underwent esophageal cancer surgery at [facility]. Additional problems include hypertension as well as hyperlipidemia in addition to peripheral vascular disease. He has documented occluded left vertebral artery with normal antegrade flow the left external carotid and did have mild carotid stenoses are noted on his last Doppler study in detecting April 2013. He also is status post femoropopliteal bypass surgery and has occlusive disease in his right SFA.   A followup nuclear perfusion study on 07/24/2013 continued to reveal normal perfusion without scar or ischemia, 17 years after his CABG revascularization surgery. He had also undergone followup carotid studies which again showed an occluded left vertebral artery and mild disease in his right and left internal carotid system with narrowings less than 49%. He also is status post femoropopliteal bypass surgery. His last LE Doppler study from 07/10/2013 showed ABIs of 1.4 bilaterally at his ankles. He did have occlusive disease with reconstitution above the knee and the right SFA. His ABI values were now noncompressible.  Gregory Eaton denies recent chest pain. He denies significant claudication symptoms. He denies dizziness. He is bothered by esophageal reflux. He status post prior surgery for esophageal cancer and states his stomach is in his thoracic cavity.  Lboratory in August 2015 by his primary physician revealed mildly increased SGPT at 72 and an elevated calcium of 11.6.  He was hospitalized in September 2015 with pneumonia.  Followup calcium level was normal at 9.2.  LFTs were normal.  He did have a followup carotid duplex scan, which again showed a  50-69% stenosis of the left internal carotid artery.  Additionally, there was preocclusive stenosis and left external carotid.  Since I last saw him, he denies any chest pain.  He denies palpitations.  There is some mild shortness of breath with significant activity.  As a history of GERD for which he takes PPI therapy.   He had undergone pulmonary function studies by Dr. Lenna Gilford and has a pulmonary nodule on the right.  He continues to be on dual antiplatelet therapy. Hypertension.  He has been on Toprol-XL 50 mg, losartan 75 mg, and amlodipine 5 mg. He continues to be on Crestor 10 mg for hyperlipidemia. He is followed by Dr. Arley Phenix for his lung disease and is on Symbicort and Spiriva as needed in addition to Singulair 10 mg daily.  He presents for reevaluation  Past Medical History  Diagnosis Date  . Esophageal carcinoma ([place]) 03/2009    T1N1M0  . Hypertension   . Barrett's esophagus   . GERD (gastroesophageal reflux disease)   . CAD (coronary artery disease)   . PVD (peripheral vascular disease) ([place])   . Hyperlipidemia   . Adenomatous colon polyp 03/2009    Last colonoscopy by Dr. [name]   . Diverticulosis   . Tobacco abuse   . Tachyarrhythmia 1999    Status post ablation at [facility]  . COPD (chronic obstructive pulmonary disease) ([facility])     Severe emphysema per CT  . History of echocardiogram 08/27/2009    EF >55%  . History of nuclear stress test 11/24/2011    lexiscan; normal perfusion; low risk scan; non-diagnostic for ischemia  .  History of Doppler ultrasound 11/09/2011    03/2014- 50-69% L ICA stenosis;carotid doppler; L bulb/prox ICA 0-49% diameter reduction; L vertebral artery - occlusive ds; L ECA  demonstrates severe amount of fibrous plaque  . History of Doppler ultrasound 11/09/11    LEAs; R ABI - mod art. insuff.; L ABI normal at rest; R SFA - occlusive ds; L SFA - occlusive ds; patent fem-pop graft  . Adenomatous polyp of colon 11/03/2010  . Left carotid artery stenosis 04/08/2014    . Pulmonary nodule, right 04/08/2014    2.8 mm-incidental finding on CT  . DM type 2 (diabetes mellitus, type 2) ([place]) 04/09/2014    A1c 6.5 (04/08/14)     Past Surgical History  Procedure Laterality Date  . Coronary artery bypass graft  1998    [name]  . Femoral-popliteal bypass graft  1993    occlusive ds in R SFA  . Lipoma excision  2010  . Esophagectomy  2010    [facility] Dr. [name]  . Gastric pull through  2010    With esophagectomy    Allergies  Allergen Reactions  . Altace [Ramipril] Cough  . Prednisone Other (See Comments)    "throat broke out"     Current Outpatient Prescriptions  Medication Sig Dispense Refill  . ALPRAZolam (XANAX) 0.5 MG tablet Take 1 tablet (0.5 mg total) by mouth at bedtime. 30 tablet 0  . amLODipine (NORVASC) 5 MG tablet TAKE ONE (1) TABLET BY MOUTH EVERY DAY 90 tablet 2  . aspirin 81 MG EC tablet Take 81 mg by mouth daily.      . clopidogrel (PLAVIX) 75 MG tablet Take 1 tablet (75 mg total) by mouth daily. 30 tablet 5  . fluticasone-salmeterol (ADVAIR HFA) 115-21 MCG/ACT inhaler Inhale 2 puffs into the lungs 2 (two) times daily. 1 Inhaler 6  . latanoprost (XALATAN) 0.005 % ophthalmic solution Place 1 drop into both eyes at bedtime.     Marland Kitchen losartan (COZAAR) 50 MG tablet Take 1.5 tablets (75 mg total) by mouth daily. 45 tablet 5  . metoprolol succinate (TOPROL-XL) 50 MG 24 hr tablet Take 1 tablet (50 mg total) by mouth daily. Take with or immediately following a meal. 30 tablet 11  . montelukast (SINGULAIR) 10 MG tablet Take 1 tablet by mouth daily.    . Multiple Vitamins-Minerals (CENTRUM SILVER PO) Take 1 tablet by mouth daily.      . nitroGLYCERIN (NITROSTAT) 0.4 MG SL tablet Place 1 tablet (0.4 mg total) under the tongue every 5 (five) minutes as needed for chest pain. 25 tablet 3  . pantoprazole (PROTONIX) 40 MG tablet Take 1 tablet (40 mg total) by mouth daily. 30 tablet 11  . rosuvastatin (CRESTOR) 10 MG tablet TAKE ONE (1) TABLET BY  MOUTH EVERY DAY 90 tablet 2  . sucralfate (CARAFATE) 1 G tablet Take 1 tablet (1 g total) by mouth 4 (four) times daily -  with meals and at bedtime. 40 tablet 0  . SYMBICORT 160-4.5 MCG/ACT inhaler Inhale 2 puffs into the lungs daily as needed. Prn expensive    . timolol (TIMOPTIC) 0.5 % ophthalmic solution Place 1 drop into both eyes daily.     Marland Kitchen tiotropium (SPIRIVA HANDIHALER) 18 MCG inhalation capsule Place 1 capsule (18 mcg total) into inhaler and inhale daily. 30 capsule 12  . tiZANidine (ZANAFLEX) 4 MG tablet Take 4 mg by mouth 3 (three) times daily as needed. Muscle spasm     No current facility-administered medications for  this visit.    Socially he is widowed has 2 children. He quit smoking in 1997.  ROS General: Negative; No fevers, chills, or night sweats;  HEENT: Negative; No changes in vision or hearing, sinus congestion, difficulty swallowing Pulmonary: Positive for  COPD , pulmonary nodule. Cardiovascular: Negative; No chest pain, presyncope, syncope, palpitations GI: Positive for GERD; No nausea, vomiting, diarrhea, or abdominal pain GU: Negative; No dysuria, hematuria, or difficulty voiding Musculoskeletal: Negative; no myalgias, joint pain, or weakness Hematologic/Oncology: Negative; no easy bruising, bleeding Endocrine: Negative; no heat/cold intolerance; no diabetes Neuro: Negative; no changes in balance, headaches Skin: Negative; No rashes or skin lesions Psychiatric: Negative; No behavioral problems, depression Sleep: Negative; No snoring, daytime sleepiness, hypersomnolence, bruxism, restless legs, hypnogognic hallucinations, no cataplexy   PE BP 122/72 mmHg  Pulse 69  Ht $R'5\' 8"'Zo$  (1.727 m)  Wt 160 lb 8 oz (72.802 kg)  BMI 24.41 kg/m2  Repeat blood pressure by me 122/76  Wt Readings from Last 3 Encounters:  05/28/15 160 lb 8 oz (72.802 kg)  04/03/15 159 lb (72.122 kg)  01/30/15 161 lb (73.029 kg)   General: Alert, oriented, no distress.  Skin: normal  turgor, no rashes HEENT: Normocephalic, atraumatic. Pupils round and reactive; sclera anicteric;no lid lag.  Nose without nasal septal hypertrophy Mouth/Parynx benign; Mallinpatti scale 2 Neck: No JVD, soft carotid bruits; normal carotid upstrokes. Lungs: clear to ausculatation and percussion; no wheezing or rales Chest wall: Nontender to palpation Heart: RRR, s1 s2 normal 1/6 systolic murmur; no diastolic murmur.  No rubs, thrills or heaves Back: No CVA tenderness. Abdomen: soft, nontender; no hepatosplenomehaly, BS+; abdominal aorta nontender and not dilated by palpation. Pulses 2+ with exception of distally which were reduced bilaterally. Extremities: no clubbing cyanosis or edema, Homan's sign negative  Neurologic: grossly nonfocal Psychological: Normal affect and mood.  ECG (independently read by me):  Normal sinus rhythm with right bundle branch block and repolarization changes. First-degree AV block with PR interval of 210 ms.  March 2016 ECG (independently read by me): Normal sinus rhythm at 64 bpm.  Right bundle branch block with repolarization changes.  First-degree AV block with a PR interval of 228 ms.  September 2015 ECG (independently read by me): Sinus rhythm with first-degree AV block.  PR interval 214 ms.  Right bundle branch block with repolarization changes.  January 2015 ECG (independently read by me): Sinus rhythm 86 beats per minute. PR interval 200 ms.  Prior ECG of July 2014 :Sinus rhythm at 70 beats per minute. First degree AV block. 1 isolated unifocal PVC.  LABS:  BMP Latest Ref Rng 04/09/2014 04/08/2014 04/07/2014  Glucose 70 - 99 mg/dL 148(H) 132(H) 67(L)  BUN 6 - 23 mg/dL $Remove'11 10 13  'ybbYDeT$ Creatinine 0.50 - 1.35 mg/dL 0.88 0.82 0.79  Sodium 137 - 147 mEq/L 138 137 142  Potassium 3.7 - 5.3 mEq/L 3.8 4.0 3.8  Chloride 96 - 112 mEq/L 103 103 104  CO2 19 - 32 mEq/L $Remove'24 23 26  'ZbHxIyy$ Calcium 8.4 - 10.5 mg/dL 8.9 8.5 9.2    Hepatic Function Latest Ref Rng 04/07/2014  03/15/2014 01/31/2013  Total Protein 6.0 - 8.3 g/dL 7.7 6.9 6.3  Albumin 3.5 - 5.2 g/dL 4.1 3.7 4.2  AST 0 - 37 U/L $Remo'20 15 15  'YhTNK$ ALT 0 - 53 U/L $Remo'19 15 14  'XgTvx$ Alk Phosphatase 39 - 117 U/L 89 77 67  Total Bilirubin 0.3 - 1.2 mg/dL 0.5 0.5 0.6    CBC Latest Ref Rng 04/09/2014 04/08/2014  04/07/2014  WBC 4.0 - 10.5 K/uL 12.4(H) 18.1(H) 12.0(H)  Hemoglobin 13.0 - 17.0 g/dL 14.0 12.9(L) 15.7  Hematocrit 39.0 - 52.0 % 40.9 37.5(L) 46.3  Platelets 150 - 400 K/uL 227 198 253    Lab Results  Component Value Date   MCV 90.5 04/09/2014   MCV 89.5 04/08/2014   MCV 90.3 04/07/2014   Lab Results  Component Value Date   TSH 1.850 04/07/2014    BNP    Component Value Date/Time   PROBNP 190.6* 04/07/2014 1600    Lipid Panel     Component Value Date/Time   CHOL 96 01/31/2013 0940   TRIG 78 01/31/2013 0940   HDL 37* 01/31/2013 0940   CHOLHDL 2.6 01/31/2013 0940   VLDL 16 01/31/2013 0940   LDLCALC 43 01/31/2013 0940     RADIOLOGY: No results found.    ASSESSMENT AND PLAN: Mr. Sheeler is a 69 year old gentleman with both CAD as well as PVD. He is s/p CABG revascularization surgery in 1998.  His last nuclear perfusion study continues to suggest patency of his grafts and this was without scar or ischemia. He is not having anginal symptoms on current therapy. His last carotid ultrasound  on 04/07/2014 showed bilateral atherosclerotic plaque in the carotid bulb and internal carotid arteries with focal 60-69% stenosis in the left internal carotid.  Preocclusive narrowing was noted in the left external carotid.  His left vertebral artery was not visualized on the most recent study.  He has occlusive disease in his right SFA but apparently continues to do well, status post femoropopliteal bypass surgery with ABIs at 1.4 which now are noncompressible.  his blood pressure today is improved on his medical regimen consisting of amlodipine 5 mg, Toprol-XL.  She 50 mg and losartan 75 mg daily. He is on Crestor for  hyperlipidemia with target LDL less than 70. He continues to be on Protonix for his GERD which is well-controlled. He has COPD for which she is on Singulair, in addition to Advair and Spiriva.  His CAD is stable and he is not having anginal symptoms. He denies any new claudication symptoms regarding his PVD.  As long as he remains stable, I will see him in one year for reevaluation.   Troy Sine, MD, [facility]  05/28/2015 11:30 AM

## 2015-06-18 ENCOUNTER — Other Ambulatory Visit: Payer: Self-pay | Admitting: Cardiovascular Disease

## 2015-06-18 NOTE — Telephone Encounter (Signed)
Rx request sent to pharmacy.  

## 2015-06-23 ENCOUNTER — Other Ambulatory Visit: Payer: Self-pay | Admitting: Cardiovascular Disease

## 2015-06-23 DIAGNOSIS — I739 Peripheral vascular disease, unspecified: Secondary | ICD-10-CM

## 2015-07-01 ENCOUNTER — Encounter: Payer: Self-pay | Admitting: *Deleted

## 2015-07-16 ENCOUNTER — Ambulatory Visit (HOSPITAL_COMMUNITY)
Admission: RE | Admit: 2015-07-16 | Discharge: 2015-07-16 | Disposition: A | Discharge status: Home / Self Care | Payer: Medicare Other | Source: Ambulatory Visit | Attending: Cardiology | Admitting: Cardiology

## 2015-07-16 DIAGNOSIS — R938 Abnormal findings on diagnostic imaging of other specified body structures: Secondary | ICD-10-CM | POA: Diagnosis not present

## 2015-07-16 DIAGNOSIS — I10 Essential (primary) hypertension: Secondary | ICD-10-CM | POA: Diagnosis not present

## 2015-07-16 DIAGNOSIS — E119 Type 2 diabetes mellitus without complications: Secondary | ICD-10-CM | POA: Diagnosis not present

## 2015-07-16 DIAGNOSIS — E785 Hyperlipidemia, unspecified: Secondary | ICD-10-CM | POA: Insufficient documentation

## 2015-07-16 DIAGNOSIS — F17200 Nicotine dependence, unspecified, uncomplicated: Secondary | ICD-10-CM | POA: Diagnosis not present

## 2015-07-16 DIAGNOSIS — I739 Peripheral vascular disease, unspecified: Secondary | ICD-10-CM | POA: Diagnosis not present

## 2015-07-18 ENCOUNTER — Ambulatory Visit (INDEPENDENT_AMBULATORY_CARE_PROVIDER_SITE_OTHER): Payer: Medicare Other | Admitting: Internal Medicine

## 2015-07-18 ENCOUNTER — Encounter: Payer: Self-pay | Admitting: Internal Medicine

## 2015-07-18 VITALS — BP 130/70 | HR 75 | Temp 98.0°F | Ht 68.0 in | Wt 158.0 lb

## 2015-07-18 DIAGNOSIS — I6522 Occlusion and stenosis of left carotid artery: Secondary | ICD-10-CM | POA: Diagnosis not present

## 2015-07-18 DIAGNOSIS — J449 Chronic obstructive pulmonary disease, unspecified: Secondary | ICD-10-CM | POA: Diagnosis not present

## 2015-07-18 MED ORDER — PREDNISONE 10 MG PO TABS: ORAL_TABLET | ORAL | Status: DC

## 2015-07-18 MED ORDER — METHYLPREDNISOLONE ACETATE 80 MG/ML IJ SUSP
120.0000 mg | Freq: Once | INTRAMUSCULAR | Status: AC
  Administered 2015-07-18: 120 mg via INTRAMUSCULAR

## 2015-07-18 MED ORDER — AZITHROMYCIN 250 MG PO TABS: ORAL_TABLET | ORAL | Status: DC

## 2015-07-18 NOTE — Progress Notes (Signed)
Subjective:     Patient ID: Gregory Eaton, male   DOB: 1946/01/28, 69 y.o.   MRN: [id number]  HPI ~  January 30, 2015:  Initial pulmonary consult w/ SN>   64 y/o WM, [place] pulmonary evaluation due to COPD/ emphysema/ & 7-56m RLL nodule on CT Chest>      CMychaelrelates a history of sinus infection/ URI in April2016 & went to his PCP DrGolding, treated w/ antibiotic & Pred=> improved but didn't get over it so he was given a second antibiotic + Spiriva, Proair, Singulair => finally improved so he tells me he stopped the Spiriva "I'm better"...  He is an ex-smoker, starting in his teens, smoked for 40 yrs up to 2ppd at max for a 55-60 pack-year smoking history... 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     He has a hx HBP, HL, CAD, s/p CABG in 1998, known carotid art dis & ASPVD w/ prev Fem-Pop bypass> followed by DrManuela Neptunel & his 10/07/14 Epic note is reviewed; meds adjusted, see below...    [facility]... EXAM revealed Afeb, VSS, O2sat=95% on RA;  HEENT- neg, mallampati2;  Chest- decr BS bilat w/o w/r/r;  Heart- RR gr1/6 SEM w/o r/g;  Abd- soft, neg;  Ext- w/o c/c/e...  CT Angio Chest 04/07/14 showed neg for PE, s/p CABG, severe atherosclerosis in Ao, severe bilat emphysema, [id number] RLL, inflamm changes in lingula, post surg changes related to esophagectomy & gastric pull thru, no signif LNs...  EKG 09/2014 showed NSR, rate64, RBBB, NAD...  [facility], unchanged 7mm24mL nodule, no new lesions, post-op changes after esophagectomy & gastric pull thru...  CXR 12/11/14 showed norm heart size, s/p CABG, COPD/ emphysema, NAD...   Spirometry 01/30/15 showed FVC=3.45 (82%),  FEV1=1.76 (54%), %1sec=51, mid-flows reduced at 23% predicted;  Mod severe airflow obstruction w/ GOLD Stage 2-3 COPD....   Ambulatory oxygen saturation test 01/30/15> O2sat=96% on RA at rest w/ pulse=67/min;  He ambulated 3 laps in the office w/ nadir O2sat=93% w/ pulse=85/min...  LABS 7/16:  Alpha-1-Antitrypsin level ==> 133 (83-199 mg/dL) and phenotype= M1M2  IMP/PLAN>>  CharDavine mod severe COPD/ emphysema based on his PFTs and heavy smoking hx; he would benefit from ICS/LABA and LAMA Rx=> rec to use SYMBICORT160-2spBid & SPIRIVA daily; he will continue PROAIR-HFA for acute symptoms prn, SINGULAIR10, and MUCINEX '600mg'$ Qid for thick phlegm/ mucus; he needs to maintain a vigorous antireflux regimen w/ his esoph dis & surg; as much as poss he will avoid infections and needs to start a vigorous exercise program (eg- silver sneakers or similar)... Prognosis is guarded w/ his signif co-morbidities (ASHD, ASPVD, Hx esoph ca w/ surg 2010, etc);  He will ret in 334mo 37moullPFTs to check DLCO...   ~  April 03, 2015:  334mo R46mo/ SN>         CharleAndersenns for f/u on his Advair115-2spBid, Spiriva daily, Singulair10, and ProairHFA prn;  He has had some trouble w/ cost of the meds and asked to get a copy of his insurance company prescription drug formulary for us to [place];  He notes that his breathing is  stable overall- still mows yard & does ok, not much cough/ sput/ no hemoptysis/ no CP/ no edema;  He has some reflux issues on Protonix40 & reminded of the vigorous antireflux regimen he needs to follow including NPO after dinner, elev HOB 6", take Pepcid ~41mn before bedtime, etc... We reviewed the following medical problems during today's office visit >>     COPD/ emphysema ==> GOLD Stage 2-3 disease>  Continue Advair115-2spBid, Spiriva daily, Singulair10, ProairHFA prn; get [place] copy of his prescription drug formulary...    7-860mRLL pulmonary nodule on CT Chest>  No change noted from 2015=>2016 & we will  repeat scan in spring of 2017...    HBP>  [place] Toprol[place], Amlod5, Cozaar50; BP=130/74 & he denies CP, palpit, SOB, edema...    CAD- s/p 5 vessel CABG>  On ASA/ Plavix; [facility]...    Carotid stenosis>      ASPVD>      Hx esophageal [facility]- [facility]>  His GI is DrRourk, RoAcupuncturistI and his suPsychologist, [facility] and [facility]as [facility] but no CareEverywhere records are avail...    Other medical issues as noted>  See below... EXAM revealed Afeb, VSS, O2sat=97% on RA;  HEENT- neg, mallampati2;  Chest- decr BS bilat w/o w/r/r;  Heart- RR gr1/6 SEM w/o r/g;  Abd- soft, neg;  Ext- w/o c/c/e...  FullPFT 04/03/15 showed FVC=4.22 (100%), FEV1=2.16 (70%), %1sec=51, mid-flows reduced at36% predicted; post-bronchodil there was a 3% incr in FEV1; TLC=6.80 (100%), RV=1.79 (75%), RV/TLC=26; DLCO=55% predicted... These PFTs indicate moderately severe airflow obstruction, GOLD Stage2-3 COPD, decr diffusion c/w emphysema...  IMP/PLAN>>  As prev noted- ChDecarloss rec to stay on the Advair,Spiriva, Proair; avoid infections, get all approp vaccinations from his PCP; follow the vigorous antireflux regimen as outlined, and plan f/u w/ [place] about 8m13moooner if needed...      07/18/2015 acute extended ov/Joselynn Amoroso re: cough in pt with copd gold II baseline / only maint rx is sinEducation administratormplaint  Patient presents with  . Acute Visit    Pt c/o chest congestion and cough x 1 wk.  Cough has been prod for the past 2 days with min yellow sputum.    baseline = no symbicort / spiriva not even saba x at least month  Acutely ill 07/12/15 restarted symb/ spiriva capsule/ mucinex but no albuterol  No obvious day to day or daytime variability or assoc significant sob or cp or chest tightness, subjective wheeze or overt sinus or hb symptoms. No unusual exp hx or h/o childhood pna/ asthma or knowledge of premature birth.  Sleeping ok  without nocturnal  or early am exacerbation  of respiratory  c/o's or need for noct saba. Also denies any obvious fluctuation of symptoms with weather or environmental changes or other aggravating or alleviating factors except as outlined above   Current Medications, Allergies, Complete Past Medical History, Past Surgical History, Family History, and Social History were reviewed in [employer].  ROS  The following are not active complaints unless bolded sore throat, dysphagia, dental problems, itching, sneezing,  nasal congestion or excess/ purulent secretions, ear ache,   fever, chills, sweats, unintended wt loss, classically pleuritic or exertional cp, hemoptysis,  orthopnea pnd or leg swelling, presyncope, palpitations, abdominal pain, anorexia, nausea, vomiting, diarrhea  or change in bowel or bladder habits, change in stools or urine, dysuria,hematuria,  rash, arthralgias, visual complaints, headache, numbness, weakness or ataxia or problems with walking or coordination,  change in mood/affect or memory.            Objective:   Physical Exam  amb wm nad   Wt Readings from Last 3 Encounters:  07/18/15 158 lb (71.668 kg)  05/28/15 160 lb 8 oz (72.802 kg)  04/03/15 159 lb (72.122 kg)           Vital Signs:  Reviewed  General:  WD, WN, 69 y/o WM in NAD; alert & oriented; pleasant & cooperative... HEENT:  [place]/AT; Conjunctiva- pink, Sclera- nonicteric, EOM-wnl, PERRLA, EACs-clear, TMs-wnl; NOSE-clear; THROAT-clear & wnl. Neck:  Supple w/ fair ROM; no JVD; normal carotid impulses w/o bruits; no thyromegaly or nodules palpated; no lymphadenopathy. Chest:  decr BS bilat, with min inps / exp wheeze and congested cough on fvc maneuver Heart:  Regular Rhythm; gr1/6 SEM w/o rubs or gallops detected. Abdomen:  Soft & nontender- no guarding or rebound; normal bowel sounds; no organomegaly or masses palpated. Ext:  decrROM; without deformities +arthritic changes; no  varicose veins, +venous insuffic, or edema;  Pulses intact w/o bruits. Neuro:  No focal neuro deficits; sensory testing normal; gait normal & balance OK. Derm:  No lesions noted; no rash etc. Lymph:  No cervical, supraclavicular, axillary, or inguinal adenopathy palpated.   Assessment:

## 2015-07-18 NOTE — Patient Instructions (Addendum)
Stop spiriva   Take zantac 150 mg at bedtime > ok use 75 mg when you are fine and not coughing   For cough mucinex up to 1200 mg every 12 hours  -  mucinex dm is ok but not the mucinex d   Continue symbicort 160 Take 2 puffs first thing in am and then another 2 puffs about 12 hours later.   GERD (REFLUX)  is an extremely common cause of respiratory symptoms just like yours , many times with no obvious heartburn at all.    It can be treated with medication, but also with lifestyle changes including elevation of the head of your bed (ideally with 6 inch  bed blocks),  Smoking cessation, avoidance of late meals, excessive alcohol, and avoid fatty foods, chocolate, peppermint, colas, red wine, and acidic juices such as orange juice.  NO MINT OR MENTHOL PRODUCTS SO NO COUGH DROPS  USE SUGARLESS CANDY INSTEAD (Jolley ranchers or Stover's or Life Savers) or even ice chips will also do - the key is to swallow to prevent all throat clearing. NO OIL BASED VITAMINS - use powdered substitutes.    zpak and Prednisone 10 mg take  4 each am x 2 days,   2 each am x 2 days,  1 each am x 2 days and stop    When having a flare of cough/ shortness of breath you may need a substitute for timoptic = betoptic

## 2015-07-24 ENCOUNTER — Encounter: Payer: Self-pay | Admitting: Internal Medicine

## 2015-07-24 NOTE — Assessment & Plan Note (Signed)
PFT's 03/2015   FEV1 2.24 (72 % ) ratio 522  p 3 % improvement from saba with DLCO  55 % corrects to 57 % for alv volume    - The proper method of use, as well as anticipated side effects, of a metered-dose inhaler are discussed and demonstrated to the patient. Improved effectiveness after extensive coaching during this visit to a level of approximately 90 % from a baseline of 75 %      He only has moderate dz and doesn't actually use any maint rx at all but now acutely ill with uri and some wheezing but no sob   Explained the natural history of uri and why it's necessary in patients at risk to treat GERD aggressively - at least  short term -   to reduce risk of evolving cyclical cough initially  triggered by epithelial injury and a heightened sensitivty to the effects of any upper airway irritants,  most importantly acid - related - then perpetuated by epithelial injury related to the cough itself as the upper airway collapses on itself.  That is, the more sensitive the epithelium becomes once it is damaged by the virus, the more the ensuing irritability> the more the cough, the more the secondary reflux (especially in those prone to reflux) the more the irritation of the sensitive mucosa and so on in a  Classic cyclical pattern.   Also cautioned that when wheezing may need to change timotpic to betoptic due to spillover B2 effect  I had an extended discussion with the patient reviewing all relevant studies completed to date and  lasting 15 to 20 minutes of a 25 minute visit    Each maintenance medication was reviewed in detail including most importantly the difference between maintenance and prns and under what circumstances the prns are to be triggered using an action plan format that is not reflected in the computer generated alphabetically organized AVS.    Please see instructions for details which were reviewed in writing and the patient given a copy highlighting the part that I personally  wrote and discussed at today's ov.

## 2015-08-04 ENCOUNTER — Ambulatory Visit: Payer: Medicare Other | Admitting: Pulmonary Disease

## 2015-08-06 ENCOUNTER — Encounter: Payer: Self-pay | Admitting: *Deleted

## 2015-09-18 ENCOUNTER — Encounter: Payer: Self-pay | Admitting: Pulmonary Disease

## 2015-09-18 ENCOUNTER — Ambulatory Visit (INDEPENDENT_AMBULATORY_CARE_PROVIDER_SITE_OTHER): Payer: Medicare Other | Admitting: Pulmonary Disease

## 2015-09-18 VITALS — BP 136/70 | HR 77 | Temp 97.6°F | Ht 68.0 in | Wt 158.2 lb

## 2015-09-18 DIAGNOSIS — K2100 Gastro-esophageal reflux disease with esophagitis, without bleeding: Secondary | ICD-10-CM

## 2015-09-18 DIAGNOSIS — I2510 Atherosclerotic heart disease of native coronary artery without angina pectoris: Secondary | ICD-10-CM | POA: Diagnosis not present

## 2015-09-18 DIAGNOSIS — J439 Emphysema, unspecified: Secondary | ICD-10-CM | POA: Diagnosis not present

## 2015-09-18 DIAGNOSIS — R911 Solitary pulmonary nodule: Secondary | ICD-10-CM

## 2015-09-18 DIAGNOSIS — C159 Malignant neoplasm of esophagus, unspecified: Secondary | ICD-10-CM

## 2015-09-18 DIAGNOSIS — I10 Essential (primary) hypertension: Secondary | ICD-10-CM

## 2015-09-18 DIAGNOSIS — Z951 Presence of aortocoronary bypass graft: Secondary | ICD-10-CM | POA: Diagnosis not present

## 2015-09-18 MED ORDER — AZITHROMYCIN 250 MG PO TABS: ORAL_TABLET | ORAL | Status: DC

## 2015-09-18 NOTE — Patient Instructions (Signed)
Today we updated your med list in our EPIC system...    Continue your current medications the same...  We wrote a prescription for a ZPak to use as needed for upper resp infection...    Remember to use the OTC MUCINEX 1200mg  twice daily for the congestion...  Try to use the SYMBICORT 2 sp twice daily on a regularlbasis to keep the bronchial tubes open & diminish any inflammation...  Stay as active as poss...  Call for any questions...  Let's plan a follow up visit in 12mo, sooner if needed for problems.Marland KitchenMarland Kitchen

## 2015-09-18 NOTE — Progress Notes (Signed)
Subjective:     Patient ID: Gregory Eaton, male   DOB: 05/19/1946, 70 y.o.   MRN: [id number]  HPI ~  January 30, 2015:  Initial pulmonary consult w/ SN>   83 y/o WM, referred by Gregory Eaton in [place] for a pulmonary evaluation due to COPD/ emphysema/ & 7-71m RLL nodule on CT Chest>      [name] a history of sinus infection/ URI in April2016 & went to his PCP Gregory Eaton, treated w/ antibiotic & Pred=> improved but didn't get over it so he was given a second antibiotic + Spiriva, Proair, Singulair => finally improved so he tells me he stopped the Spiriva "I'm better"...  He is an ex-smoker, starting in his teens, smoked for 40 yrs up to 2ppd at max for a 55-60 pack-year smoking history... 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     He has a hx HBP, HL, CAD, s/p CABG in 1998, known carotid art dis & ASPVD w/ prev Fem-Pop bypass> followed by Gregory Eaton & his 10/07/14 Epic note is reviewed; meds adjusted, see below...    [facility]... EXAM revealed Afeb, VSS, O2sat=95% on RA;  HEENT- neg, mallampati2;  Chest- decr BS bilat w/o w/r/r;  Heart- RR gr1/6 SEM w/o r/g;  Abd- soft, neg;  Ext- w/o c/c/e...  CT Angio Chest 04/07/14 showed neg for PE, s/p CABG, severe atherosclerosis in Ao, severe bilat emphysema, 45m41module RLL, inflamm changes in lingula, post surg changes related to esophagectomy & gastric pull thru, no signif LNs...  EKG 09/2014 showed NSR, rate64, RBBB, NAD...  [facility] unchanged 7mm34mL nodule, no new lesions, post-op changes after esophagectomy & gastric pull thru...  CXR 12/11/14 showed norm heart size, s/p CABG, COPD/ emphysema, NAD...   Spirometry 01/30/15 showed FVC=3.45 (82%),  FEV1=1.76 (54%), %1sec=51, mid-flows reduced at 23% predicted;  Mod severe airflow obstruction w/ GOLD Stage 2-3 COPD....   Ambulatory oxygen saturation test 01/30/15> O2sat=96% on RA at rest w/ pulse=67/min;  He ambulated 3 laps in the office w/ nadir O2sat=93% w/ pulse=85/min...  LABS 7/16:  Alpha-1-Antitrypsin level ==> 133 (83-199 mg/dL) and phenotype= M1M2  IMP/PLAN>>  Gregory Eaton mod severe COPD/ emphysema based on his PFTs and heavy smoking hx; he would benefit from ICS/LABA and LAMA Rx=> rec to use SYMBICORT160-2spBid & SPIRIVA daily; he will continue PROAIR-HFA for acute symptoms prn, SINGULAIR10, and MUCINEX '600mg'$ Qid for thick phlegm/ mucus; he needs to maintain a vigorous antireflux regimen w/ his esoph dis & surg; as much as poss he will avoid infections and needs to start a vigorous exercise program (eg- silver sneakers or similar)... Prognosis is guarded w/ his signif co-morbidities (ASHD, ASPVD, Hx esoph ca w/ surg 2010, etc);  He will ret in 3mo 83moullPFTs to check DLCO...  ~  April 03, 2015:  3mo R76mo/ SN>         [name] for f/u on his Advair115-2spBid, Spiriva daily, Singulair10, and ProairHFA prn;  He has had some trouble w/ cost of the meds and asked to get a copy of his insurance company prescription drug formulary for [place] to [place];  He notes that his breathing is stable  overall- still mows yard & does ok, not much cough/ sput/ no hemoptysis/ no CP/ no edema;  He has some reflux issues on Protonix40 & reminded of the vigorous antireflux regimen he needs to follow including NPO after dinner, elev HOB 6", take Pepcid ~44min before bedtime, etc... We reviewed the following medical problems during today's office visit >>     COPD/ emphysema ==> GOLD Stage 2-3 disease>  Continue Advair115-2spBid, Spiriva daily, Singulair10, ProairHFA prn; get [place] a copy of his prescription drug formulary...    7-86mm RLL pulmonary nodule on CT Chest>  No change noted from 2015=>2016 ([facility]- they  felt it was benign) & we discussed repeat scan in spring of 2017...    HBP>  Followed by Gregory Eaton in [place] on ToprolXL50, Amlod5, Cozaar50; BP=130/74 & he denies CP, palpit, SOB, edema...    CAD- s/p 5 vessel CABG, RBBB>  On ASA/ Plavix; followed by Gregory Eaton for Cards...    Carotid stenosis>  Mild bilat carotid stenoses on CDopplers    ASPVD>  s/p right Fem-Pop    Hx esophageal cancer- s/p esophagectomy w/ gastric pull through at [facility] in 2010>  His GI is Gregory Eaton, [facility] and his surgery was done at [facility] in 2010 but no CareEverywhere records are avail...    Other medical issues as noted>  See below... EXAM revealed Afeb, VSS, O2sat=97% on RA;  HEENT- neg, mallampati2;  Chest- decr BS bilat w/o w/r/r;  Heart- RR gr1/6 SEM w/o r/g;  Abd- soft, neg;  Ext- w/o c/c/e...  FullPFT 04/03/15 showed FVC=4.22 (100%), FEV1=2.16 (70%), %1sec=51, mid-flows reduced at 36% predicted; post-bronchodil there was a 3% incr in FEV1; TLC=6.80 (100%), RV=1.79 (75%), RV/TLC=26; DLCO=55% predicted... These PFTs indicate moderately severe airflow obstruction, GOLD Stage2-3 COPD, decr diffusion c/w emphysema...  IMP/PLAN>>  As prev noted- Gregory Eaton is rec to stay on the Advair,Spiriva, Proair; avoid infections, get all approp vaccinations from his PCP; follow the vigorous antireflux regimen as outlined, and plan f/u w/ [place] in about 43mo, sooner if needed...   ~  September 18, 2015:  31mo ROV w/ SN>  Park had a COPD exac 06/2015 & saw Gregory Eaton- he had stopped his Symbicort & Spiriva, presnted w/ some congestion & wheezing, & given ZPak, Pred, Symbicort, Mucinex, Zantac;  He did well after this therapy until recent similar symptoms- cough, chest congestion, yellow sput, but he denies f/c/s, incr SOB, chest tightness/ wheezing/ etc;  He is agin NOT using his Symbicort regularly as it costs him $150/mo; he has been active- using his exerc bike, caring for wife s/p TKR, yard work & push mows...     COPD/ emphysema ==> GOLD Stage 2-3  disease>  He again stopped his Symbicort & he is off the Spiriva; on Singulair10, ProairHFA prn; needs to get [place] a copy of his prescription drug formulary (reminded again)...    7-27mm RLL pulmonary nodule on CT Chest>  No change noted from 2015=>2016 ([facility]- they felt it was benign) & we discussed repeat CT Chest but he wants to hold-off...    He had Cards appt 05/2015 w/ DrKelly> HBP, CAD, s/pCABG 1998, ASPVD- s/p right Fem-Pop/ occluded left vertebral/ mild carotid dis, HL, esoph ca surg [facility]; he was felt to be stable, no changes made. EXAM revealed Afeb, VSS, O2sat=98% on RA;  HEENT- neg, mallampati2;  Chest- decr BS bilat w/o w/r/r;  Heart- RR gr1/6 SEM w/o r/g;  Abd- soft, neg;  Ext- w/o c/c/e;  Neuro- intact... IMP/PLAN>>  I  again explained to [facility] the need for regular ICS/LABA medication- he needs to contact his insurance regarding a their prescription drug formulary, in the meanwhile we will refill the Symbicort160- 2spBid;  Rx written for ZPak w/ refills per his request...    Past Medical History  Diagnosis Date  . Esophageal carcinoma 03/2009    T1N1M0  . Hypertension >> on Metoprolol, Amlodipine, Losartan   . Barrett's esophagus   . GERD (gastroesophageal reflux disease) >> on Protonix40 & Carafate 1gmQid   . CAD (coronary artery disease)   . PVD (peripheral vascular disease)   . Hyperlipidemia >> on Crestor10, Fish Oil + diet   . Adenomatous colon polyp 03/2009    Last colonoscopy by Dr. Jena Gauss   . Diverticulosis   . Tobacco abuse   . Tachyarrhythmia 1999    Status post ablation at [facility]  . COPD (chronic obstructive pulmonary disease)     Severe emphysema per CT  . History of echocardiogram 08/27/2009    EF >55%  . History of nuclear stress test 11/24/2011    lexiscan; normal perfusion; low risk scan; non-diagnostic for ischemia  . History of Doppler ultrasound 11/09/2011    03/2014- 50-69% L ICA stenosis;carotid doppler; L bulb/prox ICA 0-49% diameter reduction; L vertebral  artery - occlusive ds; L ECA  demonstrates severe amount of fibrous plaque  . History of Doppler ultrasound 11/09/11    LEAs; R ABI - mod art. insuff.; L ABI normal at rest; R SFA - occlusive ds; L SFA - occlusive ds; patent fem-pop graft  . Adenomatous polyp of colon 11/03/2010  . Left carotid artery stenosis 04/08/2014  . Pulmonary nodule, right 04/08/2014    8 mm-incidental finding on CT  . DM type 2 (diabetes mellitus, type 2) >> on diet alone 04/09/2014    A1c 6.5 (04/08/14)     Past Surgical History  Procedure Laterality Date  . Coronary artery bypass graft  1998    [name]  . Femoral-popliteal bypass graft  1993    occlusive ds in R SFA  . Lipoma excision  2010  . Esophagectomy  2010    [facility] Dr. [name]  . Gastric pull through  2010    With esophagectomy    Outpatient Encounter Prescriptions as of 09/18/2015  Medication Sig  . ALPRAZolam (XANAX) 0.5 MG tablet Take 1 tablet (0.5 mg total) by mouth at bedtime.  Marland Kitchen amLODipine (NORVASC) 5 MG tablet TAKE ONE (1) TABLET BY MOUTH EVERY DAY  . aspirin 81 MG EC tablet Take 81 mg by mouth daily.    . clopidogrel (PLAVIX) 75 MG tablet Take 1 tablet (75 mg total) by mouth daily.  Marland Kitchen dextromethorphan-guaiFENesin (MUCINEX DM) 30-600 MG 12hr tablet Take 1 tablet by mouth 2 (two) times daily as needed for cough.  . latanoprost (XALATAN) 0.005 % ophthalmic solution Place 1 drop into both eyes at bedtime.   Marland Kitchen losartan (COZAAR) 50 MG tablet Take 1.5 tablets (75 mg total) by mouth daily.  . metoprolol succinate (TOPROL-XL) 50 MG 24 hr tablet Take 1 tablet (50 mg total) by mouth daily. Take with or immediately following a meal.  . montelukast (SINGULAIR) 10 MG tablet Take 1 tablet by mouth daily.  . Multiple Vitamins-Minerals (CENTRUM SILVER PO) Take 1 tablet by mouth daily.    . nitroGLYCERIN (NITROSTAT) 0.4 MG SL tablet Place 1 tablet (0.4 mg total) under the tongue every 5 (five) minutes as needed for chest pain.  . pantoprazole (PROTONIX)  40 MG  tablet Take 1 tablet (40 mg total) by mouth daily.  . rosuvastatin (CRESTOR) 10 MG tablet TAKE ONE (1) TABLET BY MOUTH EVERY DAY  . sucralfate (CARAFATE) 1 G tablet Take 1 tablet (1 g total) by mouth 4 (four) times daily -  with meals and at bedtime.  . SYMBICORT 160-4.5 MCG/ACT inhaler Inhale 2 puffs into the lungs daily as needed. Prn expensive  . timolol (TIMOPTIC) 0.5 % ophthalmic solution Place 1 drop into both eyes daily.   Marland Kitchen tiZANidine (ZANAFLEX) 4 MG tablet Take 4 mg by mouth 3 (three) times daily as needed. Muscle spasm  . azithromycin (ZITHROMAX) 250 MG tablet Take as directed  . [DISCONTINUED] azithromycin (ZITHROMAX) 250 MG tablet Take 2 on day one then 1 daily x 4 days  . [DISCONTINUED] predniSONE (DELTASONE) 10 MG tablet Take  4 each am x 2 days,   2 each am x 2 days,  1 each am x 2 days and stop   No facility-administered encounter medications on file as of 09/18/2015.    Allergies  Allergen Reactions  . Altace [Ramipril] Cough  . Prednisone Other (See Comments)    "throat broke out"     Current Medications, Allergies, Past Medical History, Past Surgical History, Family History, and Social History were reviewed in [employer] record.   Review of Systems            All symptoms NEG except where BOLDED >>  Constitutional:  F/C/S, fatigue, anorexia, unexpected weight change. HEENT:  HA, visual changes, hearing loss, earache, nasal symptoms, sore throat, mouth sores, hoarseness. Resp:  cough, sputum, hemoptysis; SOB, tightness, wheezing. Cardio:  CP, palpit, DOE, orthopnea, edema. GI:  N/V/D/C, blood in stool; reflux, abd pain, distention, gas. GU:  dysuria, freq, urgency, hematuria, flank pain, voiding difficulty. MS:  joint pain, swelling, tenderness, decr ROM; neck pain, back pain, etc. Neuro:  HA, tremors, seizures, dizziness, syncope, weakness, numbness, gait abn. Skin:  suspicious lesions or skin rash. Heme:  adenopathy, bruising,  bleeding. Psyche:  confusion, agitation, sleep disturbance, hallucinations, anxiety, depression suicidal.   Objective:   Physical Exam      Vital Signs:  Reviewed...   General:  WD, WN, 70 y/o WM in NAD; alert & oriented; pleasant & cooperative... HEENT:  [place]/AT; Conjunctiva- pink, Sclera- nonicteric, EOM-wnl, PERRLA, EACs-clear, TMs-wnl; NOSE-clear; THROAT-clear & wnl.  Neck:  Supple w/ fair ROM; no JVD; normal carotid impulses w/o bruits; no thyromegaly or nodules palpated; no lymphadenopathy.  Chest:  decr BS bilat, without wheezes, rales, or rhonchi heard. Heart:  Regular Rhythm; gr1/6 SEM w/o rubs or gallops detected. Abdomen:  Soft & nontender- no guarding or rebound; normal bowel sounds; no organomegaly or masses palpated. Ext:  decrROM; without deformities +arthritic changes; no varicose veins, +venous insuffic, or edema;  Pulses intact w/o bruits. Neuro:  No focal neuro deficits; sensory testing normal; gait normal & balance OK. Derm:  No lesions noted; no rash etc. Lymph:  No cervical, supraclavicular, axillary, or inguinal adenopathy palpated.   Assessment:      IMP >>     COPD/ emphysema ==> GOLD Stage 2-3 disease    7-63m RLL pulmonary nodule on CT Chest    HBP    CAD- s/p 5 vessel CABG    Carotid stenosis    ASPVD- s/p Fem-Pop    [facility]    Other medical issues as noted...   PLAN >>  01/30/15>  Darrol has mod severe COPD/ emphysema based on his PFTs and heaqvy smoking hx; he would benefit from ICS/LABA and LAMA Rx=> rec to use SYMBICORT160-2spBid & SPIRIVA daily; he will continue PROAIR-HFA for acute symptoms prn, SINGULAIR10, and MUCINEX '600mg'$ Qid for thick phlegm/ mucus;; he needs to maintain a vigorous antireflux regimen w/ his esoph dis & surg; as much as poss he will avoid infections and needs to start a vigorous exercise program (eg- silver sneakers or similar)... Prognosis is guarded w/ his  signif co-morbidities (ASHD, ASPVD, Hx esoph ca w/ surg 2010, etc);  He will ret in 13mow/ FullPFTs to check DLCO. 04/03/15>  As prev noted- CBernhardtis rec to stay on the Advair,Spiriva, Proair; avoid infections, get all approp vaccinations from his PCP; follow the vigorous antireflux regimen as outlined, and plan f/[place]/ [place] about [place] if needed. 09/18/15>   I again explained to ChReynoldhe need for regular ICS/LABA medication- he needs to contact his insurance regarding a their prescription drug formulary, in the meanwhile we will refill the Symbicort160- 2spBid;  Rx written for ZPak w/ refills per his request.     Plan:       Patient's Medications  New Prescriptions   AZITHROMYCIN (ZITHROMAX) 250 MG TABLET    Take as directed  Previous Medications   ALPRAZOLAM (XANAX) 0.5 MG TABLET    Take 1 tablet (0.5 mg total) by mouth at bedtime.   AMLODIPINE (NORVASC) 5 MG TABLET    TAKE ONE (1) TABLET BY MOUTH EVERY DAY   ASPIRIN 81 MG EC TABLET    Take 81 mg by mouth daily.     CLOPIDOGREL (PLAVIX) 75 MG TABLET    Take 1 tablet (75 mg total) by mouth daily.   DEXTROMETHORPHAN-GUAIFENESIN (MUCINEX DM) 30-600 MG 12HR TABLET    Take 1 tablet by mouth 2 (two) times daily as needed for cough.   LATANOPROST (XALATAN) 0.005 % OPHTHALMIC SOLUTION    Place 1 drop into both eyes at bedtime.    LOSARTAN (COZAAR) 50 MG TABLET    Take 1.5 tablets (75 mg total) by mouth daily.   METOPROLOL SUCCINATE (TOPROL-XL) 50 MG 24 HR TABLET    Take 1 tablet (50 mg total) by mouth daily. Take with or immediately following a meal.   MONTELUKAST (SINGULAIR) 10 MG TABLET    Take 1 tablet by mouth daily.   MULTIPLE VITAMINS-MINERALS (CENTRUM SILVER PO)    Take 1 tablet by mouth daily.     NITROGLYCERIN (NITROSTAT) 0.4 MG SL TABLET    Place 1 tablet (0.4 mg total) under the tongue every 5 (five) minutes as needed for chest pain.   PANTOPRAZOLE (PROTONIX) 40 MG TABLET    Take 1 tablet (40 mg total) by mouth daily.    ROSUVASTATIN (CRESTOR) 10 MG TABLET    TAKE ONE (1) TABLET BY MOUTH EVERY DAY   SUCRALFATE (CARAFATE) 1 G TABLET    Take 1 tablet (1 g total) by mouth 4 (four) times daily -  with meals and at bedtime.   SYMBICORT 160-4.5 MCG/ACT INHALER    Inhale 2 puffs into the lungs daily as needed. Prn expensive   TIMOLOL (TIMOPTIC) 0.5 % OPHTHALMIC SOLUTION    Place 1 drop into both eyes daily.    TIZANIDINE (ZANAFLEX) 4 MG TABLET    Take 4 mg by mouth 3 (three) times daily as needed. Muscle spasm  Modified Medications   No medications on file  Discontinued Medications  AZITHROMYCIN (ZITHROMAX) 250 MG TABLET    Take 2 on day one then 1 daily x 4 days   PREDNISONE (DELTASONE) 10 MG TABLET    Take  4 each am x 2 days,   2 each am x 2 days,  1 each am x 2 days and stop

## 2015-10-15 ENCOUNTER — Other Ambulatory Visit: Payer: Self-pay | Admitting: Cardiovascular Disease

## 2015-10-15 NOTE — Telephone Encounter (Signed)
REFILL 

## 2015-10-17 DIAGNOSIS — Z0000 Encounter for general adult medical examination without abnormal findings: Secondary | ICD-10-CM | POA: Diagnosis not present

## 2015-10-17 DIAGNOSIS — Z1389 Encounter for screening for other disorder: Secondary | ICD-10-CM | POA: Diagnosis not present

## 2015-10-17 DIAGNOSIS — Z6824 Body mass index (BMI) 24.0-24.9, adult: Secondary | ICD-10-CM | POA: Diagnosis not present

## 2015-10-22 DIAGNOSIS — B078 Other viral warts: Secondary | ICD-10-CM | POA: Diagnosis not present

## 2015-10-22 DIAGNOSIS — Z1389 Encounter for screening for other disorder: Secondary | ICD-10-CM | POA: Diagnosis not present

## 2015-10-22 DIAGNOSIS — B079 Viral wart, unspecified: Secondary | ICD-10-CM | POA: Diagnosis not present

## 2015-10-22 DIAGNOSIS — R239 Unspecified skin changes: Secondary | ICD-10-CM | POA: Diagnosis not present

## 2015-10-22 DIAGNOSIS — L989 Disorder of the skin and subcutaneous tissue, unspecified: Secondary | ICD-10-CM | POA: Diagnosis not present

## 2015-10-22 DIAGNOSIS — Z6824 Body mass index (BMI) 24.0-24.9, adult: Secondary | ICD-10-CM | POA: Diagnosis not present

## 2015-10-29 DIAGNOSIS — H40013 Open angle with borderline findings, low risk, bilateral: Secondary | ICD-10-CM | POA: Diagnosis not present

## 2015-11-06 DIAGNOSIS — R110 Nausea: Secondary | ICD-10-CM | POA: Diagnosis not present

## 2015-11-06 DIAGNOSIS — J40 Bronchitis, not specified as acute or chronic: Secondary | ICD-10-CM | POA: Diagnosis not present

## 2015-11-14 DIAGNOSIS — J209 Acute bronchitis, unspecified: Secondary | ICD-10-CM | POA: Diagnosis not present

## 2015-11-14 DIAGNOSIS — J449 Chronic obstructive pulmonary disease, unspecified: Secondary | ICD-10-CM | POA: Diagnosis not present

## 2015-11-14 DIAGNOSIS — Z6823 Body mass index (BMI) 23.0-23.9, adult: Secondary | ICD-10-CM | POA: Diagnosis not present

## 2015-11-14 DIAGNOSIS — Z1389 Encounter for screening for other disorder: Secondary | ICD-10-CM | POA: Diagnosis not present

## 2015-11-19 ENCOUNTER — Other Ambulatory Visit: Payer: Self-pay | Admitting: Cardiovascular Disease

## 2015-11-19 NOTE — Telephone Encounter (Signed)
Rx Refill(s)

## 2015-11-27 ENCOUNTER — Other Ambulatory Visit: Payer: Self-pay | Admitting: Cardiovascular Disease

## 2015-11-27 NOTE — Telephone Encounter (Signed)
Rx(s) sent to pharmacy electronically.  

## 2015-12-02 ENCOUNTER — Ambulatory Visit (HOSPITAL_COMMUNITY)
Admission: RE | Admit: 2015-12-02 | Discharge: 2015-12-02 | Disposition: A | Discharge status: Home / Self Care | Payer: Medicare Other | Source: Ambulatory Visit | Attending: Family Medicine | Admitting: Family Medicine

## 2015-12-02 ENCOUNTER — Other Ambulatory Visit (HOSPITAL_COMMUNITY): Payer: Self-pay | Admitting: Family Medicine

## 2015-12-02 DIAGNOSIS — Z1389 Encounter for screening for other disorder: Secondary | ICD-10-CM | POA: Diagnosis not present

## 2015-12-02 DIAGNOSIS — J209 Acute bronchitis, unspecified: Secondary | ICD-10-CM | POA: Diagnosis not present

## 2015-12-02 DIAGNOSIS — Z6823 Body mass index (BMI) 23.0-23.9, adult: Secondary | ICD-10-CM | POA: Diagnosis not present

## 2015-12-02 DIAGNOSIS — R918 Other nonspecific abnormal finding of lung field: Secondary | ICD-10-CM | POA: Diagnosis not present

## 2015-12-02 DIAGNOSIS — J449 Chronic obstructive pulmonary disease, unspecified: Secondary | ICD-10-CM | POA: Diagnosis not present

## 2015-12-02 DIAGNOSIS — H6690 Otitis media, unspecified, unspecified ear: Secondary | ICD-10-CM | POA: Insufficient documentation

## 2015-12-02 DIAGNOSIS — J208 Acute bronchitis due to other specified organisms: Secondary | ICD-10-CM

## 2015-12-02 DIAGNOSIS — R05 Cough: Secondary | ICD-10-CM | POA: Diagnosis not present

## 2016-01-07 DIAGNOSIS — R1013 Epigastric pain: Secondary | ICD-10-CM | POA: Diagnosis not present

## 2016-01-07 DIAGNOSIS — E782 Mixed hyperlipidemia: Secondary | ICD-10-CM | POA: Diagnosis not present

## 2016-01-07 DIAGNOSIS — Z6823 Body mass index (BMI) 23.0-23.9, adult: Secondary | ICD-10-CM | POA: Diagnosis not present

## 2016-01-07 DIAGNOSIS — C159 Malignant neoplasm of esophagus, unspecified: Secondary | ICD-10-CM | POA: Diagnosis not present

## 2016-01-08 DIAGNOSIS — R938 Abnormal findings on diagnostic imaging of other specified body structures: Secondary | ICD-10-CM | POA: Diagnosis not present

## 2016-01-08 DIAGNOSIS — Z1389 Encounter for screening for other disorder: Secondary | ICD-10-CM | POA: Diagnosis not present

## 2016-01-08 DIAGNOSIS — E782 Mixed hyperlipidemia: Secondary | ICD-10-CM | POA: Diagnosis not present

## 2016-01-08 DIAGNOSIS — R1013 Epigastric pain: Secondary | ICD-10-CM | POA: Diagnosis not present

## 2016-01-08 DIAGNOSIS — C159 Malignant neoplasm of esophagus, unspecified: Secondary | ICD-10-CM | POA: Diagnosis not present

## 2016-01-09 ENCOUNTER — Other Ambulatory Visit: Payer: Self-pay | Admitting: Cardiovascular Disease

## 2016-02-03 ENCOUNTER — Ambulatory Visit: Payer: Medicare Other | Admitting: Internal Medicine

## 2016-02-11 ENCOUNTER — Other Ambulatory Visit: Payer: Self-pay | Admitting: Pulmonary Disease

## 2016-02-12 DIAGNOSIS — J343 Hypertrophy of nasal turbinates: Secondary | ICD-10-CM | POA: Diagnosis not present

## 2016-02-12 DIAGNOSIS — J329 Chronic sinusitis, unspecified: Secondary | ICD-10-CM | POA: Diagnosis not present

## 2016-02-12 DIAGNOSIS — E782 Mixed hyperlipidemia: Secondary | ICD-10-CM | POA: Diagnosis not present

## 2016-02-12 DIAGNOSIS — R070 Pain in throat: Secondary | ICD-10-CM | POA: Diagnosis not present

## 2016-02-12 DIAGNOSIS — E119 Type 2 diabetes mellitus without complications: Secondary | ICD-10-CM | POA: Diagnosis not present

## 2016-02-12 DIAGNOSIS — J069 Acute upper respiratory infection, unspecified: Secondary | ICD-10-CM | POA: Diagnosis not present

## 2016-02-12 DIAGNOSIS — I10 Essential (primary) hypertension: Secondary | ICD-10-CM | POA: Diagnosis not present

## 2016-02-12 DIAGNOSIS — Z6824 Body mass index (BMI) 24.0-24.9, adult: Secondary | ICD-10-CM | POA: Diagnosis not present

## 2016-02-24 IMAGING — CR DG CHEST 1V PORT
1 series · 1 of 1 positions shown · non-contrast
Comparison: CT chest 01/24/2009 and chest radiograph 07/26/2007.

CLINICAL DATA: Chest pain.

EXAM:
PORTABLE CHEST - 1 VIEW

[pa]
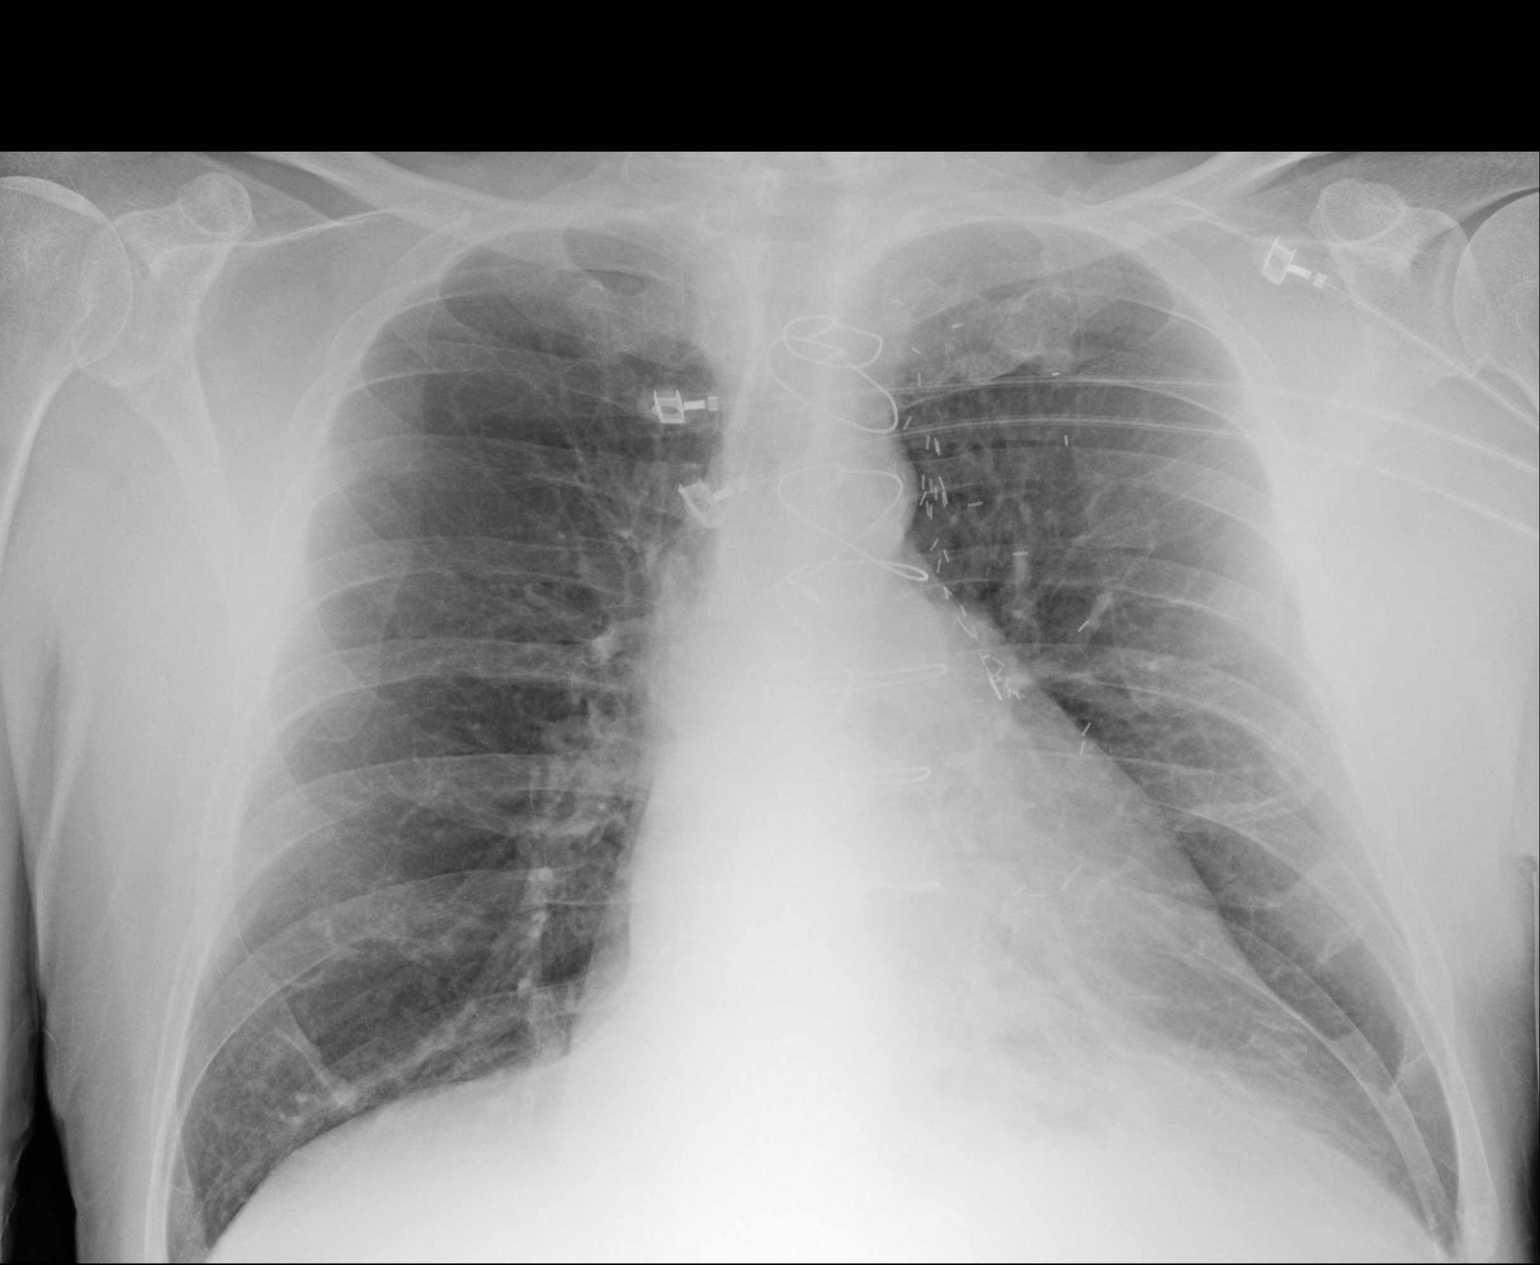

[1 of 1 positions shown; findings below may reference images not displayed]

FINDINGS: Trachea is midline. Heart size stable. Lungs are clear. No pleural
fluid.
IMPRESSION: No acute findings.

## 2016-03-05 ENCOUNTER — Encounter: Payer: Self-pay | Admitting: Internal Medicine

## 2016-03-05 ENCOUNTER — Ambulatory Visit (INDEPENDENT_AMBULATORY_CARE_PROVIDER_SITE_OTHER): Payer: Medicare Other | Admitting: Internal Medicine

## 2016-03-05 VITALS — BP 131/77 | HR 72 | Temp 98.0°F | Ht 68.0 in | Wt 153.8 lb

## 2016-03-05 DIAGNOSIS — K635 Polyp of colon: Secondary | ICD-10-CM | POA: Diagnosis not present

## 2016-03-05 DIAGNOSIS — R1013 Epigastric pain: Secondary | ICD-10-CM

## 2016-03-05 DIAGNOSIS — I2510 Atherosclerotic heart disease of native coronary artery without angina pectoris: Secondary | ICD-10-CM

## 2016-03-05 DIAGNOSIS — K219 Gastro-esophageal reflux disease without esophagitis: Secondary | ICD-10-CM

## 2016-03-05 DIAGNOSIS — T50905A Adverse effect of unspecified drugs, medicaments and biological substances, initial encounter: Secondary | ICD-10-CM

## 2016-03-05 NOTE — Progress Notes (Signed)
Primary Care Physician:  Purvis Kilts, MD Primary Gastroenterologist:  Dr. [name]  Pre-Procedure History & Physical: HPI:  Gregory Eaton is a 70 y.o. male here for eval of epigastric pain in assoc with antibiotics for bronchitis recently.  Finished abx; pain has resolved - almost cancelled appt.  Hx of adeno Ca of esophagus - S/P esophagectomy - felt to be cured. No nausea or vomiting or melena; Hx of colonic adenomas removed 2010; due for surveillance TCS. His reflux is fairly well controlled with Protonix 40 mg daily.  No dysphagia.  Past Medical History:  Diagnosis Date  . Adenomatous colon polyp 03/2009   Last colonoscopy by Dr. [name]   . Adenomatous polyp 2010  . Adenomatous polyp of colon 11/03/2010  . Barrett's esophagus   . CAD (coronary artery disease)   . COPD (chronic obstructive pulmonary disease) (HCC)    Severe emphysema per CT  . Diverticulosis   . DM type 2 (diabetes mellitus, type 2) ([place]) 04/09/2014   A1c 6.5 (04/08/14)   . Esophageal carcinoma ([place]) 03/2009   T1N1M0  . GERD (gastroesophageal reflux disease)   . History of Doppler ultrasound 11/09/2011   03/2014- 50-69% L ICA stenosis;carotid doppler; L bulb/prox ICA 0-49% diameter reduction; L vertebral artery - occlusive ds; L ECA  demonstrates severe amount of fibrous plaque  . History of Doppler ultrasound 11/09/11   LEAs; R ABI - mod art. insuff.; L ABI normal at rest; R SFA - occlusive ds; L SFA - occlusive ds; patent fem-pop graft  . History of echocardiogram 08/27/2009   EF >55%  . History of nuclear stress test 11/24/2011   lexiscan; normal perfusion; low risk scan; non-diagnostic for ischemia  . Hyperlipidemia   . Hypertension   . Left carotid artery stenosis 04/08/2014  . Pulmonary nodule, right 04/08/2014   2.8 mm-incidental finding on CT  . PVD (peripheral vascular disease) ([place])   . Tachyarrhythmia 1999   Status post ablation at [facility]  . Tobacco abuse     Past Surgical History:  Procedure  Laterality Date  . COLONOSCOPY  03/17/2009   Dr.Kinzleigh Kandler- normal rectum, sigmoid diverticula, some pale sigmoid mucosa with diffuse petechiae. pedunculated polyp at the splenic flexure, remainder of colonic mucosa appeared normal. bx= adenomatous polyp  . COLONOSCOPY  04/19/2003   Dr.Rehman- few diverticu;a at the sigmoid colon, 3 small polyps, one at the transverse colon and 2 at the sigmoid, small external hemorrhoids. bx report not available.   . CORONARY ARTERY BYPASS GRAFT  1998   [name]  . ESOPHAGECTOMY  2010   [facility] Dr. [name]  . ESOPHAGOGASTRODUODENOSCOPY  11/25/2010   Dr.Willey Due- s/p esophagectomy with gastric pull-up, esophageal erosions straddling the surgical anastomosis, salmon colored epithelium coming up a good centimeter to a centimeter and a half above the suture line, islands of salmon colored epithelium in the most poximal residual esophagus, remainder of gastric mucosa appeared normal. bx= swamous &gastric glandular mucosa w/chronic active inflammation  . ESOPHAGOGASTRODUODENOSCOPY  03/17/2009   Dr.Maze Corniel- 4cm segment of salmon-colored epithlium distal esophagus suspicious for barretts esophagus. area of suspicious nodularity w/in this segment bx seperately. small to moderate size hiatal hernia, o/w normal stomach D1 and D2 bx=adenocarcinoma  . FEMORAL-POPLITEAL BYPASS GRAFT  1993   occlusive ds in R SFA  . GASTRIC PULL THROUGH  2010   With esophagectomy  . LIPOMA EXCISION  2010    Prior to Admission medications   Medication Sig Start Date End Date Taking? Authorizing  Provider  ALPRAZolam Duanne Moron) 0.5 MG tablet Take 1 tablet (0.5 mg total) by mouth at bedtime. 10/07/14  Yes Troy Sine, MD  amLODipine (NORVASC) 5 MG tablet TAKE ONE (1) TABLET BY MOUTH EVERY DAY 06/18/15  Yes Troy Sine, MD  aspirin 81 MG EC tablet Take 81 mg by mouth daily.     Yes Historical Provider, MD  clopidogrel (PLAVIX) 75 MG tablet TAKE ONE (1) TABLET BY MOUTH EVERY DAY 11/27/15  Yes Troy Sine, MD  latanoprost (XALATAN) 0.005 % ophthalmic solution Place 1 drop into both eyes at bedtime.  01/10/13  Yes Historical Provider, MD  losartan (COZAAR) 50 MG tablet TAKE ONE AND ONE-HALF TABLETS BY MOUTH DAILY 11/19/15  Yes Troy Sine, MD  metoprolol succinate (TOPROL-XL) 50 MG 24 hr tablet TAKE ONE (1) TABLET EACH DAY. TAKE WITH OR IMMEDIATELY FOLLOWING A MEAL. 11/19/15  Yes Troy Sine, MD  montelukast (SINGULAIR) 10 MG tablet Take 1 tablet by mouth daily. 09/10/14  Yes Historical Provider, MD  Multiple Vitamins-Minerals (CENTRUM SILVER PO) Take 1 tablet by mouth daily.     Yes Historical Provider, MD  pantoprazole (PROTONIX) 40 MG tablet TAKE ONE (1) TABLET EACH DAY 10/15/15  Yes Troy Sine, MD  ranitidine (ZANTAC) 150 MG tablet Take 150 mg by mouth at bedtime.   Yes Historical Provider, MD  rosuvastatin (CRESTOR) 10 MG tablet TAKE ONE (1) TABLET BY MOUTH EVERY DAY 01/09/16  Yes Troy Sine, MD  sucralfate (CARAFATE) 1 G tablet Take 1 tablet (1 g total) by mouth 4 (four) times daily -  with meals and at bedtime. 03/15/14  Yes Milton Ferguson, MD  SYMBICORT 160-4.5 MCG/ACT inhaler INHALE TWO PUFFS INTO THE LUNGS TWO TIMES DAILY 02/12/16  Yes Noralee Space, MD  timolol (TIMOPTIC) 0.5 % ophthalmic solution Place 1 drop into both eyes daily.  01/10/13  Yes Historical Provider, MD  azithromycin (ZITHROMAX) 250 MG tablet Take as directed Patient not taking: Reported on 03/05/2016 09/18/15   Noralee Space, MD  dextromethorphan-guaiFENesin [facility] DM) 30-600 MG 12hr tablet Take 1 tablet by mouth 2 (two) times daily as needed for cough.    Historical Provider, MD  nitroGLYCERIN (NITROSTAT) 0.4 MG SL tablet Place 1 tablet (0.4 mg total) under the tongue every 5 (five) minutes as needed for chest pain. Patient not taking: Reported on 03/05/2016 01/29/13   Troy Sine, MD  SYMBICORT 160-4.5 MCG/ACT inhaler Inhale 2 puffs into the lungs daily as needed. Prn expensive 04/24/15   Historical Provider, MD    tiZANidine (ZANAFLEX) 4 MG tablet Take 4 mg by mouth 3 (three) times daily as needed. Muscle spasm 02/27/14   Historical Provider, MD    Allergies as of 03/05/2016 - Review Complete 03/05/2016  Allergen Reaction Noted  . Altace [ramipril] Cough 04/07/2014  . Prednisone Other (See Comments) 10/28/2010    Family History  Problem Relation Age of Onset  . GER disease Mother   . Coronary artery disease Brother   . Congenital heart disease Sister     Social History   Social History  . Marital status: Married    Spouse name: N/A  . Number of children: 2  . Years of education: N/A   Occupational History  . retired Retired    Administrator   Social History Main Topics  . Smoking status: Former Smoker    Packs/day: 2.00    Years: 40.00    Types: Cigarettes    Quit  date: 07/20/1995  . Smokeless tobacco: Never Used  . Alcohol use No     Comment: occassional  . Drug use: No  . Sexual activity: Yes    Partners: Female    Birth control/ protection: Condom     Comment: friend   Other Topics Concern  . Not on file   Social History Narrative  . No narrative on file    Review of Systems: See HPI, otherwise negative ROS  Physical Exam: BP 131/77   Pulse 72   Temp 98 F (36.7 C) (Oral)   Ht 5\' 8"  (1.727 m)   Wt 153 lb 12.8 oz (69.8 kg)   BMI 23.39 kg/m  General:   Alert,  Well-developed, well-nourished, pleasant and cooperative in NAD  Neck:  Supple; no masses or thyromegaly. No significant cervical adenopathy. Lungs:  Clear throughout to auscultation.   No wheezes, crackles, or rhonchi. No acute distress. Heart:  Regular rate and rhythm; no murmurs, clicks, rubs,  or gallops. Abdomen: Non-distended, normal bowel sounds.  Soft and nontender without appreciable mass or hepatosplenomegaly.  Pulses:  Normal pulses noted. Extremities:  Without clubbing or edema.  Impression:  Pleasant 70 y/o gentleman with a hx of esophageal cancer - s/p esophagectomy. Recent abdominal  pain, at least temporally, related to taking antibiotics recently.  Abdominal pain has resolved.  I suspect transient dyspepsia secondary to ABX intolerance. However, given his hx, his UGI tract should be evaluated further.  Hx of colonic adenoma; due for surveillance colonoscopy   Recommendations:  I have offered pt both an EGD and surveillance TCS in the near future.  The risks, benefits, limitations, imponderables and alternatives regarding both EGD and colonoscopy have been reviewed with the patient. Questions have been answered. All parties agreeable.   Given polypharmacy and multiple co-morbidities, will enlist the help of anesthesia for sedation.  Pt going to the beach and want to put it off for 6-8 weeks or so.  This is certainly reasonable.  Further recommendations to follow      Notice: This dictation was prepared with Dragon dictation along with smaller phrase technology. Any transcriptional errors that result from this process are unintentional and may not be corrected upon review.

## 2016-03-05 NOTE — Patient Instructions (Signed)
Schedule diagnostic EGD - epigastric pain and surveillance colonoscopy(hx of polyps) in November per patient request  Will use propofol  Further recommendations to follow

## 2016-03-23 ENCOUNTER — Ambulatory Visit (INDEPENDENT_AMBULATORY_CARE_PROVIDER_SITE_OTHER): Payer: Medicare Other | Admitting: Pulmonary Disease

## 2016-03-23 ENCOUNTER — Encounter: Payer: Self-pay | Admitting: Pulmonary Disease

## 2016-03-23 VITALS — BP 142/86 | HR 69 | Temp 97.3°F | Ht 68.0 in | Wt 158.5 lb

## 2016-03-23 DIAGNOSIS — I10 Essential (primary) hypertension: Secondary | ICD-10-CM

## 2016-03-23 DIAGNOSIS — Z951 Presence of aortocoronary bypass graft: Secondary | ICD-10-CM

## 2016-03-23 DIAGNOSIS — K2100 Gastro-esophageal reflux disease with esophagitis, without bleeding: Secondary | ICD-10-CM

## 2016-03-23 DIAGNOSIS — R911 Solitary pulmonary nodule: Secondary | ICD-10-CM

## 2016-03-23 DIAGNOSIS — I2510 Atherosclerotic heart disease of native coronary artery without angina pectoris: Secondary | ICD-10-CM | POA: Diagnosis not present

## 2016-03-23 DIAGNOSIS — J439 Emphysema, unspecified: Secondary | ICD-10-CM | POA: Diagnosis not present

## 2016-03-23 DIAGNOSIS — C159 Malignant neoplasm of esophagus, unspecified: Secondary | ICD-10-CM

## 2016-03-23 MED ORDER — TIOTROPIUM BROMIDE MONOHYDRATE 18 MCG IN CAPS: 18.0000 ug | ORAL_CAPSULE | Freq: Every day | RESPIRATORY_TRACT | 6 refills | Status: DC

## 2016-03-23 NOTE — Progress Notes (Signed)
Subjective:     Patient ID: Gregory Eaton, male   DOB: 1946-05-12, 70 y.o.   MRN: [id number]  HPI ~  January 30, 2015:  Initial pulmonary consult w/ SN>   59 y/o WM, referred by DrGolding in [place] for a pulmonary evaluation due to COPD/ emphysema/ & 7-1m RLL nodule on CT Chest>      CCejayrelates a history of sinus infection/ URI in April2016 & went to his PCP DrGolding, treated w/ antibiotic & Pred=> improved but didn't get over it so he was given a second antibiotic + Spiriva, Proair, Singulair => finally improved so he tells me he stopped the Spiriva "I'm better"...  He is an ex-smoker, starting in his teens, smoked for 40 yrs up to 2ppd at max for a 55-60 pack-year smoking history... 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     He has a hx HBP, HL, CAD, s/p CABG in 1998, known carotid art dis & ASPVD w/ prev Fem-Pop bypass> followed by DrManuela Neptunel & his 10/07/14 Epic note is reviewed; meds adjusted, see below...    [facility]... EXAM revealed Afeb, VSS, O2sat=95% on RA;  HEENT- neg, mallampati2;  Chest- decr BS bilat w/o w/r/r;  Heart- RR gr1/6 SEM w/o r/g;  Abd- soft, neg;  Ext- w/o c/c/e...  CT Angio Chest 04/07/14 showed neg for PE, s/p CABG, severe atherosclerosis in Ao, severe bilat emphysema, 23m17module RLL, inflamm changes in lingula, post surg changes related to esophagectomy & gastric pull thru, no signif LNs...  EKG 09/2014 showed NSR, rate64, RBBB, NAD...  [facility], unchanged 7mm24mL nodule, no new lesions, post-op changes after esophagectomy & gastric pull thru...  CXR 12/11/14 showed norm heart size, s/p CABG, COPD/ emphysema, NAD...   Spirometry 01/30/15 showed FVC=3.45 (82%),  FEV1=1.76 (54%), %1sec=51, mid-flows reduced at 23% predicted;  Mod severe airflow obstruction w/ GOLD Stage 2-3 COPD....   Ambulatory oxygen saturation test 01/30/15> O2sat=96% on RA at rest w/ pulse=67/min;  He ambulated 3 laps in the office w/ nadir O2sat=93% w/ pulse=85/min...  LABS 7/16:  Alpha-1-Antitrypsin level ==> 133 (83-199 mg/dL) and phenotype= M1M2  IMP/PLAN>>  CharAbdirahman mod severe COPD/ emphysema based on his PFTs and heavy smoking hx; he would benefit from ICS/LABA and LAMA Rx=> rec to use SYMBICORT160-2spBid & SPIRIVA daily; he will continue PROAIR-HFA for acute symptoms prn, SINGULAIR10, and MUCINEX 600mg423mfor thick phlegm/ mucus; he needs to maintain a vigorous antireflux regimen w/ his esoph dis & surg; as much as poss he will avoid infections and needs to start a vigorous exercise program (eg- silver sneakers or similar)... Prognosis is guarded w/ his signif co-morbidities (ASHD, ASPVD, Hx esoph ca w/ surg 2010, etc);  He will ret in 732mo w2732mollPFTs to check DLCO...  ~  April 03, 2015:  732mo RO37mo SN>         CharlesEncarnacions for f/u on his Advair115-2spBid, Spiriva daily, Singulair10, and ProairHFA prn;  He has had some trouble w/ cost of the meds and asked to get a copy of his insurance company prescription drug formulary for us to rKoreaiew;  He notes that his breathing is stable  overall- still mows yard & does ok, not much cough/ sput/ no hemoptysis/ no CP/ no edema;  He has some reflux issues on Protonix40 & reminded of the vigorous antireflux regimen he needs to follow including NPO after dinner, elev HOB 6", take Pepcid ~46mn before bedtime, etc... We reviewed the following medical problems during today's office visit >>     COPD/ emphysema ==> GOLD Stage 2-3 disease>  Continue Advair115-2spBid, Spiriva daily, Singulair10, ProairHFA prn; get u[place]ore[place] copy of his prescription drug formulary...    7-88mRLL pulmonary nodule on CT Chest>  No change noted from 2015=>2016 ([facility]- they  felt it was benign) & we discussed repeat scan in spring of 2017...    HBP>  [place] Toprol[place], Amlod5, Cozaar50; BP=130/74 & he denies CP, palpit, SOB, edema...    CAD- s/p 5 vessel CABG, RBBB>  On ASA/ Plavix; [facility]...    Carotid stenosis>  Mild bilat carotid stenoses on CDopplers    ASPVD>  s/p right Fem-Pop    Hx esophageal cancer- [facility]>  His GI is DrRourk, [facility] and [facility] but no CareEverywhere records are avail...    Other medical issues as noted>  See below... EXAM revealed Afeb, VSS, O2sat=97% on RA;  HEENT- neg, mallampati2;  Chest- decr BS bilat w/o w/r/r;  Heart- RR gr1/6 SEM w/o r/g;  Abd- soft, neg;  Ext- w/o c/c/e...  FullPFT 04/03/15 showed FVC=4.22 (100%), FEV1=2.16 (70%), %1sec=51, mid-flows reduced at 36% predicted; post-bronchodil there was a 3% incr in FEV1; TLC=6.80 (100%), RV=1.79 (75%), RV/TLC=26; DLCO=55% predicted... These PFTs indicate moderately severe airflow obstruction, GOLD Stage2-3 COPD, decr diffusion c/w emphysema...  IMP/PLAN>>  As prev noted- ChEthels rec to stay on the Advair,Spiriva, Proair; avoid infections, get all approp vaccinations from his PCP; follow the vigorous antireflux regimen as outlined, and plan f/u w/ usKorean about 96m58moooner if needed...   ~  September 18, 2015:  2mo90mo w/ SN>  CharMarsean a COPD exac 06/2015 & saw DrWert- he had stopped his Symbicort & Spiriva, presented w/ some congestion & wheezing, & given ZPak, Pred, Symbicort, Mucinex, Zantac;  He did well after this therapy until recent similar symptoms- cough, chest congestion, yellow sput, but he denies f/c/s, incr SOB, chest tightness/ wheezing/ etc;  He is again NOT using his Symbicort regularly as it costs him $150/mo; he has been active- using his exerc bike, caring for wife s/p TKR, yard work & push mows...     COPD/ emphysema ==> GOLD Stage 2-3  disease>  He again stopped his Symbicort & he is off the Spiriva; on Singulair10, ProairHFA prn; needs to get us aKoreaopy of his prescription drug formulary (reminded again)...    7-8mm 27m pulmonary nodule on CT Chest>  No change noted from 2015=>2016 ([facility]- they felt it was benign) & we discussed repeat CT Chest but he wants to hold-off...    He had Cards appt 05/2015 w/ DrKelly> HBP, CAD, s/pCABG 1998, ASPVD- s/p right Fem-Pop/ occluded left vertebral/ mild carotid dis, HL, [facility], no changes made. EXAM revealed Afeb, VSS, O2sat=98% on RA;  HEENT- neg, mallampati2;  Chest- decr BS bilat w/o w/r/r;  Heart- RR gr1/6 SEM w/o r/g;  Abd- soft, neg;  Ext- w/o c/c/e;  Neuro- intact... IMP/PLAN>>  I  again explained to [facility] the need for regular ICS/LABA medication- he needs to contact his insurance regarding a their prescription drug formulary, in the meanwhile we will refill the Symbicort160- 2spBid;  Rx written for ZPak w/ refills per his request...  ~  March 23, 2016:  90moROV w/ SN>  CFremanreports that he is doing well- no new complaints or concerns;  He has min cough Qam, sm amt clear sput (no color or blood), "it may be reflux" he says; denies SOB/ CP/ palpit/ etc; he does have chr stable DOE (no change- eg. climbing hills);  On SYMBICORT160-2spBid (taking it "most of the time", notes $45 copay now), SINGULAIR10, Mucinex- just taking this prn...  NOTE: his FEV1 was 2.16L Sep2016 & has dropped to 1.76L today => he is asked to add the [facility] to [facility] med regimen (alternatives- Incruse, TCaprice Renshaw he will check formulary),,,     See prob list above-- HBP, CAD- s/p 5vessel CABG, RBBB, ASPVD- s/p R fem-pop, bilat carotid stenoses> on ASA/PLAVIX, MetopXL50, Amlod5, Losar75;  BP stable, denies CP, palpit, ch in DOE, edema, etc...     He has hx esoph Ca, s/p esophagectomy & felt to be cured; [facility] ([facility]) seen  03/03/16- plans f/u EGD/ Colon soon...  EXAM revealed Afeb, VSS, O2sat=98% on RA;  HEENT- neg, mallampati2;  Chest- decr BS bilat w/o w/r/r;  Heart- RR gr1/6 SEM w/o r/g;  Abd- soft, neg;  Ext- w/o c/c/e;  Neuro- intact...  Spirometry 03/23/16>  FVC=4.10 (101%), FEV1=1.76 (59%), %1sec=43, mid-flows reduced at 20% predicted... This is c/w a severe obstructive ventilatory defect & GOLD Stage 2 CPD... IMP/PLAN>>  His PCP is DrGolding (seen 2-3x/yr, Cards-DrKelly, GI-DrRourk;  We reviewed his deterioration in FEV1 over the last yr & rec to take the Symbicort- 2sp Bid regularly & add-in SPIRIVA once daily every day... we plan routine ROV in 693mo.    Past Medical History  Diagnosis Date  . Esophageal carcinoma 03/2009    T1N1M0  . Hypertension >> on Metoprolol, Amlodipine, Losartan   . Barrett's esophagus   . GERD (gastroesophageal reflux disease) >> on Protonix40 & Carafate 1gmQid   . CAD (coronary artery disease)   . PVD (peripheral vascular disease)   . Hyperlipidemia >> on Crestor10, Fish Oil + diet   . Adenomatous colon polyp 03/2009    Last colonoscopy by Dr. RoGala Romney . Diverticulosis   . Tobacco abuse   . Tachyarrhythmia 1999    [facility] COPD (chronic obstructive pulmonary disease)     Severe emphysema per CT  . History of echocardiogram 08/27/2009    EF >55%  . History of nuclear stress test 11/24/2011    lexiscan; normal perfusion; low risk scan; non-diagnostic for ischemia  . History of Doppler ultrasound 11/09/2011    03/2014- 50-69% L ICA stenosis;carotid doppler; L bulb/prox ICA 0-49% diameter reduction; L vertebral artery - occlusive ds; L ECA  demonstrates severe amount of fibrous plaque  . History of Doppler ultrasound 11/09/11    LEAs; R ABI - mod art. insuff.; L ABI normal at rest; R SFA - occlusive ds; L SFA - occlusive ds; patent fem-pop graft  . Adenomatous polyp of colon 11/03/2010  . Left carotid artery stenosis 04/08/2014  . Pulmonary nodule, right  04/08/2014    8 mm-incidental finding on CT  . DM type 2 (diabetes mellitus, type 2) >> on diet alone 04/09/2014  A1c 6.5 (04/08/14)     Past Surgical History:  Procedure Laterality Date  . COLONOSCOPY  03/17/2009   Dr.Rourk- normal rectum, sigmoid diverticula, some pale sigmoid mucosa with diffuse petechiae. pedunculated polyp at the splenic flexure, remainder of colonic mucosa appeared normal. bx= adenomatous polyp  . COLONOSCOPY  04/19/2003   Dr.Rehman- few diverticu;a at the sigmoid colon, 3 small polyps, one at the transverse colon and 2 at the sigmoid, small external hemorrhoids. bx report not available.   . CORONARY ARTERY BYPASS GRAFT  1998   [name]  . ESOPHAGECTOMY  2010   [facility] Dr. [name]  . ESOPHAGOGASTRODUODENOSCOPY  11/25/2010   Dr.Rourk- s/p esophagectomy with gastric pull-up, esophageal erosions straddling the surgical anastomosis, salmon colored epithelium coming up a good centimeter to a centimeter and a half above the suture line, islands of salmon colored epithelium in the most poximal residual esophagus, remainder of gastric mucosa appeared normal. bx= swamous &gastric glandular mucosa w/chronic active inflammation  . ESOPHAGOGASTRODUODENOSCOPY  03/17/2009   Dr.Rourk- 4cm segment of salmon-colored epithlium distal esophagus suspicious for barretts esophagus. area of suspicious nodularity w/in this segment bx seperately. small to moderate size hiatal hernia, o/w normal stomach D1 and D2 bx=adenocarcinoma  . FEMORAL-POPLITEAL BYPASS GRAFT  1993   occlusive ds in R SFA  . GASTRIC PULL THROUGH  2010   With esophagectomy  . LIPOMA EXCISION  2010    Outpatient Encounter Prescriptions as of 03/23/2016  Medication Sig  . ALPRAZolam (XANAX) 0.5 MG tablet Take 1 tablet (0.5 mg total) by mouth at bedtime.  Marland Kitchen amLODipine (NORVASC) 5 MG tablet TAKE ONE (1) TABLET BY MOUTH EVERY DAY  . aspirin 81 MG EC tablet Take 81 mg by mouth daily.    . clopidogrel (PLAVIX) 75 MG tablet  TAKE ONE (1) TABLET BY MOUTH EVERY DAY  . latanoprost (XALATAN) 0.005 % ophthalmic solution Place 1 drop into both eyes at bedtime.   Marland Kitchen losartan (COZAAR) 50 MG tablet TAKE ONE AND ONE-HALF TABLETS BY MOUTH DAILY  . metoprolol succinate (TOPROL-XL) 50 MG 24 hr tablet TAKE ONE (1) TABLET EACH DAY. TAKE WITH OR IMMEDIATELY FOLLOWING A MEAL.  . montelukast (SINGULAIR) 10 MG tablet Take 1 tablet by mouth daily.  . Multiple Vitamins-Minerals (CENTRUM SILVER PO) Take 1 tablet by mouth daily.    . nitroGLYCERIN (NITROSTAT) 0.4 MG SL tablet Place 1 tablet (0.4 mg total) under the tongue every 5 (five) minutes as needed for chest pain.  . pantoprazole (PROTONIX) 40 MG tablet TAKE ONE (1) TABLET EACH DAY  . ranitidine (ZANTAC) 150 MG tablet Take 150 mg by mouth at bedtime.  . rosuvastatin (CRESTOR) 10 MG tablet TAKE ONE (1) TABLET BY MOUTH EVERY DAY  . sucralfate (CARAFATE) 1 G tablet Take 1 tablet (1 g total) by mouth 4 (four) times daily -  with meals and at bedtime.  . SYMBICORT 160-4.5 MCG/ACT inhaler INHALE TWO PUFFS INTO THE LUNGS TWO TIMES DAILY  . timolol (TIMOPTIC) 0.5 % ophthalmic solution Place 1 drop into both eyes daily.   Marland Kitchen dextromethorphan-guaiFENesin (MUCINEX DM) 30-600 MG 12hr tablet Take 1 tablet by mouth 2 (two) times daily as needed for cough.  . tiotropium (SPIRIVA) 18 MCG inhalation capsule Place 1 capsule (18 mcg total) into inhaler and inhale daily.  Marland Kitchen tiZANidine (ZANAFLEX) 4 MG tablet Take 4 mg by mouth 3 (three) times daily as needed. Muscle spasm  . [DISCONTINUED] azithromycin (ZITHROMAX) 250 MG tablet Take as directed (Patient not taking: Reported on 03/05/2016)  . [  DISCONTINUED] SYMBICORT 160-4.5 MCG/ACT inhaler Inhale 2 puffs into the lungs daily as needed. Prn expensive   No facility-administered encounter medications on file as of 03/23/2016.     Allergies  Allergen Reactions  . Altace [Ramipril] Cough  . Prednisone Other (See Comments)    "throat broke out"     Current  Medications, Allergies, Past Medical History, Past Surgical History, Family History, and Social History were reviewed in [employer] record.   Review of Systems            All symptoms NEG except where BOLDED >>  Constitutional:  F/C/S, fatigue, anorexia, unexpected weight change. HEENT:  HA, visual changes, hearing loss, earache, nasal symptoms, sore throat, mouth sores, hoarseness. Resp:  cough, sputum, hemoptysis; SOB, tightness, wheezing. Cardio:  CP, palpit, DOE, orthopnea, edema. GI:  N/V/D/C, blood in stool; reflux, abd pain, distention, gas. GU:  dysuria, freq, urgency, hematuria, flank pain, voiding difficulty. MS:  joint pain, swelling, tenderness, decr ROM; neck pain, back pain, etc. Neuro:  HA, tremors, seizures, dizziness, syncope, weakness, numbness, gait abn. Skin:  suspicious lesions or skin rash. Heme:  adenopathy, bruising, bleeding. Psyche:  confusion, agitation, sleep disturbance, hallucinations, anxiety, depression suicidal.   Objective:   Physical Exam      Vital Signs:  Reviewed...   General:  WD, WN, 70 y/o WM in NAD; alert & oriented; pleasant & cooperative... HEENT:  [place]/AT; Conjunctiva- pink, Sclera- nonicteric, EOM-wnl, PERRLA, EACs-clear, TMs-wnl; NOSE-clear; THROAT-clear & wnl.  Neck:  Supple w/ fair ROM; no JVD; normal carotid impulses w/o bruits; no thyromegaly or nodules palpated; no lymphadenopathy.  Chest:  decr BS bilat, without wheezes, rales, or rhonchi heard. Heart:  Regular Rhythm; gr1/6 SEM w/o rubs or gallops detected. Abdomen:  Soft & nontender- no guarding or rebound; normal bowel sounds; no organomegaly or masses palpated. Ext:  decrROM; without deformities +arthritic changes; no varicose veins, +venous insuffic, or edema;  Pulses intact w/o bruits. Neuro:  No focal neuro deficits; sensory testing normal; gait normal & balance OK. Derm:  No lesions noted; no rash etc. Lymph:  No cervical, supraclavicular, axillary, or  inguinal adenopathy palpated.   Assessment:      IMP >>     COPD/ emphysema ==> GOLD Stage 2-3 disease    7-35m RLL pulmonary nodule on CT Chest    HBP    CAD- s/p 5 vessel CABG    Carotid stenosis    ASPVD- s/p Fem-Pop    [facility]    Other medical issues as noted...   PLAN >>  01/30/15>  CDanyellhas mod severe COPD/ emphysema based on his PFTs and heaqvy smoking hx; he would benefit from ICS/LABA and LAMA Rx=> rec to use SYMBICORT160-2spBid & SPIRIVA daily; he will continue PROAIR-HFA for acute symptoms prn, SINGULAIR10, and MUCINEX 6037mid for thick phlegm/ mucus;; he needs to maintain a vigorous antireflux regimen w/ his esoph dis & surg; as much as poss he will avoid infections and needs to start a vigorous exercise program (eg- silver sneakers or similar)... Prognosis is guarded w/ his signif co-morbidities (ASHD, ASPVD, Hx esoph ca w/ surg 2010, etc);  He will ret in 64m77mo FullPFTs to check DLCO. 04/03/15>  As prev noted- ChaJaimere rec to stay on the Advair,Spiriva, Proair; avoid infections, get all approp vaccinations from his PCP; follow the vigorous antireflux regimen as outlined, and plan f/u w/ us [place] about 52mo6mooner  if needed. 09/18/15>   I again explained to [place] the need for regular ICS/LABA medication- he needs to contact his insurance regarding a their prescription drug formulary, in the meanwhile we will refill the Symbicort160- 2spBid;  Rx written for ZPak w/ refills per his request.     Plan:       Patient's Medications  New Prescriptions   TIOTROPIUM (SPIRIVA) 18 MCG INHALATION CAPSULE    Place 1 capsule (18 mcg total) into inhaler and inhale daily.  Previous Medications   ALPRAZOLAM (XANAX) 0.5 MG TABLET    Take 1 tablet (0.5 mg total) by mouth at bedtime.   AMLODIPINE (NORVASC) 5 MG TABLET    TAKE ONE (1) TABLET BY MOUTH EVERY DAY   ASPIRIN 81 MG EC TABLET    Take 81 mg by mouth daily.      CLOPIDOGREL (PLAVIX) 75 MG TABLET    TAKE ONE (1) TABLET BY MOUTH EVERY DAY   DEXTROMETHORPHAN-GUAIFENESIN (MUCINEX DM) 30-600 MG 12HR TABLET    Take 1 tablet by mouth 2 (two) times daily as needed for cough.   LATANOPROST (XALATAN) 0.005 % OPHTHALMIC SOLUTION    Place 1 drop into both eyes at bedtime.    LOSARTAN (COZAAR) 50 MG TABLET    TAKE ONE AND ONE-HALF TABLETS BY MOUTH DAILY   METOPROLOL SUCCINATE (TOPROL-XL) 50 MG 24 HR TABLET    TAKE ONE (1) TABLET EACH DAY. TAKE WITH OR IMMEDIATELY FOLLOWING A MEAL.   MONTELUKAST (SINGULAIR) 10 MG TABLET    Take 1 tablet by mouth daily.   MULTIPLE VITAMINS-MINERALS (CENTRUM SILVER PO)    Take 1 tablet by mouth daily.     NITROGLYCERIN (NITROSTAT) 0.4 MG SL TABLET    Place 1 tablet (0.4 mg total) under the tongue every 5 (five) minutes as needed for chest pain.   PANTOPRAZOLE (PROTONIX) 40 MG TABLET    TAKE ONE (1) TABLET EACH DAY   RANITIDINE (ZANTAC) 150 MG TABLET    Take 150 mg by mouth at bedtime.   ROSUVASTATIN (CRESTOR) 10 MG TABLET    TAKE ONE (1) TABLET BY MOUTH EVERY DAY   SUCRALFATE (CARAFATE) 1 G TABLET    Take 1 tablet (1 g total) by mouth 4 (four) times daily -  with meals and at bedtime.   SYMBICORT 160-4.5 MCG/ACT INHALER    INHALE TWO PUFFS INTO THE LUNGS TWO TIMES DAILY   TIMOLOL (TIMOPTIC) 0.5 % OPHTHALMIC SOLUTION    Place 1 drop into both eyes daily.    TIZANIDINE (ZANAFLEX) 4 MG TABLET    Take 4 mg by mouth 3 (three) times daily as needed. Muscle spasm  Modified Medications   No medications on file  Discontinued Medications   AZITHROMYCIN (ZITHROMAX) 250 MG TABLET    Take as directed   SYMBICORT 160-4.5 MCG/ACT INHALER    Inhale 2 puffs into the lungs daily as needed. Prn expensive

## 2016-03-23 NOTE — Patient Instructions (Addendum)
Today we updated your med list in our EPIC system...    Continue your current medications the same...  Today we rechecked your Spirometry breathing test...    It showed some deterioration from 2016 & I rec restarting the [facility] once daily...  Continue your exercise program, yard work, etc...  Be sure to get the 2017 flu vaccine and check w/ your PCP to be sure you are up-to-date.  Call for any questions...  Let's plan a follow up visit in 87mo, sooner if needed for problems.Marland KitchenMarland Kitchen

## 2016-03-29 DIAGNOSIS — J209 Acute bronchitis, unspecified: Secondary | ICD-10-CM | POA: Diagnosis not present

## 2016-03-29 DIAGNOSIS — Z6823 Body mass index (BMI) 23.0-23.9, adult: Secondary | ICD-10-CM | POA: Diagnosis not present

## 2016-03-29 DIAGNOSIS — J449 Chronic obstructive pulmonary disease, unspecified: Secondary | ICD-10-CM | POA: Diagnosis not present

## 2016-03-29 DIAGNOSIS — J069 Acute upper respiratory infection, unspecified: Secondary | ICD-10-CM | POA: Diagnosis not present

## 2016-04-15 DIAGNOSIS — E7800 Pure hypercholesterolemia, unspecified: Secondary | ICD-10-CM | POA: Diagnosis present

## 2016-04-15 DIAGNOSIS — Z7982 Long term (current) use of aspirin: Secondary | ICD-10-CM | POA: Diagnosis not present

## 2016-04-15 DIAGNOSIS — J440 Chronic obstructive pulmonary disease with acute lower respiratory infection: Secondary | ICD-10-CM | POA: Diagnosis not present

## 2016-04-15 DIAGNOSIS — R042 Hemoptysis: Secondary | ICD-10-CM | POA: Diagnosis not present

## 2016-04-15 DIAGNOSIS — Z951 Presence of aortocoronary bypass graft: Secondary | ICD-10-CM | POA: Diagnosis not present

## 2016-04-15 DIAGNOSIS — J9601 Acute respiratory failure with hypoxia: Secondary | ICD-10-CM | POA: Diagnosis not present

## 2016-04-15 DIAGNOSIS — J90 Pleural effusion, not elsewhere classified: Secondary | ICD-10-CM | POA: Diagnosis not present

## 2016-04-15 DIAGNOSIS — Z7951 Long term (current) use of inhaled steroids: Secondary | ICD-10-CM | POA: Diagnosis not present

## 2016-04-15 DIAGNOSIS — I498 Other specified cardiac arrhythmias: Secondary | ICD-10-CM | POA: Diagnosis not present

## 2016-04-15 DIAGNOSIS — Z7401 Bed confinement status: Secondary | ICD-10-CM | POA: Diagnosis not present

## 2016-04-15 DIAGNOSIS — I25810 Atherosclerosis of coronary artery bypass graft(s) without angina pectoris: Secondary | ICD-10-CM | POA: Diagnosis not present

## 2016-04-15 DIAGNOSIS — R918 Other nonspecific abnormal finding of lung field: Secondary | ICD-10-CM | POA: Diagnosis not present

## 2016-04-15 DIAGNOSIS — R05 Cough: Secondary | ICD-10-CM | POA: Diagnosis not present

## 2016-04-15 DIAGNOSIS — E785 Hyperlipidemia, unspecified: Secondary | ICD-10-CM | POA: Diagnosis not present

## 2016-04-15 DIAGNOSIS — I2510 Atherosclerotic heart disease of native coronary artery without angina pectoris: Secondary | ICD-10-CM | POA: Diagnosis not present

## 2016-04-15 DIAGNOSIS — J189 Pneumonia, unspecified organism: Secondary | ICD-10-CM | POA: Diagnosis not present

## 2016-04-15 DIAGNOSIS — Z7902 Long term (current) use of antithrombotics/antiplatelets: Secondary | ICD-10-CM | POA: Diagnosis not present

## 2016-04-15 DIAGNOSIS — A419 Sepsis, unspecified organism: Secondary | ICD-10-CM | POA: Diagnosis not present

## 2016-04-15 DIAGNOSIS — I10 Essential (primary) hypertension: Secondary | ICD-10-CM | POA: Diagnosis present

## 2016-04-15 DIAGNOSIS — Z23 Encounter for immunization: Secondary | ICD-10-CM | POA: Diagnosis not present

## 2016-04-15 DIAGNOSIS — D72829 Elevated white blood cell count, unspecified: Secondary | ICD-10-CM | POA: Diagnosis not present

## 2016-04-15 DIAGNOSIS — Z87891 Personal history of nicotine dependence: Secondary | ICD-10-CM | POA: Diagnosis not present

## 2016-05-12 ENCOUNTER — Ambulatory Visit (INDEPENDENT_AMBULATORY_CARE_PROVIDER_SITE_OTHER)
Admission: RE | Admit: 2016-05-12 | Discharge: 2016-05-12 | Disposition: A | Discharge status: Home / Self Care | Payer: Medicare Other | Source: Ambulatory Visit | Attending: Pulmonary Disease | Admitting: Pulmonary Disease

## 2016-05-12 ENCOUNTER — Ambulatory Visit (INDEPENDENT_AMBULATORY_CARE_PROVIDER_SITE_OTHER): Payer: Medicare Other | Admitting: Pulmonary Disease

## 2016-05-12 ENCOUNTER — Encounter: Payer: Self-pay | Admitting: Pulmonary Disease

## 2016-05-12 VITALS — BP 142/84 | HR 76 | Temp 96.9°F | Ht 68.0 in | Wt 158.0 lb

## 2016-05-12 DIAGNOSIS — I739 Peripheral vascular disease, unspecified: Secondary | ICD-10-CM

## 2016-05-12 DIAGNOSIS — R911 Solitary pulmonary nodule: Secondary | ICD-10-CM | POA: Diagnosis not present

## 2016-05-12 DIAGNOSIS — I2510 Atherosclerotic heart disease of native coronary artery without angina pectoris: Secondary | ICD-10-CM

## 2016-05-12 DIAGNOSIS — J439 Emphysema, unspecified: Secondary | ICD-10-CM | POA: Diagnosis not present

## 2016-05-12 DIAGNOSIS — I10 Essential (primary) hypertension: Secondary | ICD-10-CM

## 2016-05-12 DIAGNOSIS — C159 Malignant neoplasm of esophagus, unspecified: Secondary | ICD-10-CM

## 2016-05-12 DIAGNOSIS — J189 Pneumonia, unspecified organism: Secondary | ICD-10-CM | POA: Diagnosis not present

## 2016-05-12 DIAGNOSIS — R05 Cough: Secondary | ICD-10-CM | POA: Diagnosis not present

## 2016-05-12 DIAGNOSIS — K219 Gastro-esophageal reflux disease without esophagitis: Secondary | ICD-10-CM

## 2016-05-12 DIAGNOSIS — J449 Chronic obstructive pulmonary disease, unspecified: Secondary | ICD-10-CM

## 2016-05-12 DIAGNOSIS — I679 Cerebrovascular disease, unspecified: Secondary | ICD-10-CM | POA: Insufficient documentation

## 2016-05-12 DIAGNOSIS — Z951 Presence of aortocoronary bypass graft: Secondary | ICD-10-CM

## 2016-05-12 MED ORDER — BUDESONIDE-FORMOTEROL FUMARATE 160-4.5 MCG/ACT IN AERO: 2.0000 | INHALATION_SPRAY | Freq: Two times a day (BID) | RESPIRATORY_TRACT | 0 refills | Status: DC

## 2016-05-12 NOTE — Patient Instructions (Signed)
Gregory Eaton-- glad you are better...  Continue your current meds the same...  Today we rechecked your CXR...    We will contact you w/ the results when available...   Stay as active as possible...    But break-up the yard work into smaller packets!  ,Call for any questions.Marland KitchenMarland Kitchen

## 2016-05-18 ENCOUNTER — Telehealth: Payer: Self-pay | Admitting: Pulmonary Disease

## 2016-05-18 ENCOUNTER — Other Ambulatory Visit: Payer: Self-pay | Admitting: Cardiovascular Disease

## 2016-05-18 NOTE — Telephone Encounter (Signed)
Called and spoke with pt and he is aware of results per SN.  Nothing further is needed.

## 2016-05-24 ENCOUNTER — Encounter (HOSPITAL_COMMUNITY): Payer: Self-pay | Admitting: *Deleted

## 2016-05-24 ENCOUNTER — Emergency Department (HOSPITAL_COMMUNITY): Payer: Medicare Other

## 2016-05-24 ENCOUNTER — Emergency Department (HOSPITAL_COMMUNITY)
Admission: EM | Admit: 2016-05-24 | Discharge: 2016-05-24 | Disposition: A | Discharge status: Home / Self Care | Payer: Medicare Other | Source: Home / Self Care | Attending: Emergency Medicine | Admitting: Emergency Medicine

## 2016-05-24 DIAGNOSIS — Z87891 Personal history of nicotine dependence: Secondary | ICD-10-CM | POA: Insufficient documentation

## 2016-05-24 DIAGNOSIS — I10 Essential (primary) hypertension: Secondary | ICD-10-CM

## 2016-05-24 DIAGNOSIS — J189 Pneumonia, unspecified organism: Secondary | ICD-10-CM

## 2016-05-24 DIAGNOSIS — J449 Chronic obstructive pulmonary disease, unspecified: Secondary | ICD-10-CM | POA: Insufficient documentation

## 2016-05-24 DIAGNOSIS — E785 Hyperlipidemia, unspecified: Secondary | ICD-10-CM | POA: Diagnosis not present

## 2016-05-24 DIAGNOSIS — A419 Sepsis, unspecified organism: Secondary | ICD-10-CM | POA: Diagnosis not present

## 2016-05-24 DIAGNOSIS — Z79899 Other long term (current) drug therapy: Secondary | ICD-10-CM | POA: Insufficient documentation

## 2016-05-24 DIAGNOSIS — Z7982 Long term (current) use of aspirin: Secondary | ICD-10-CM | POA: Insufficient documentation

## 2016-05-24 DIAGNOSIS — I2510 Atherosclerotic heart disease of native coronary artery without angina pectoris: Secondary | ICD-10-CM | POA: Insufficient documentation

## 2016-05-24 DIAGNOSIS — E119 Type 2 diabetes mellitus without complications: Secondary | ICD-10-CM

## 2016-05-24 DIAGNOSIS — K219 Gastro-esophageal reflux disease without esophagitis: Secondary | ICD-10-CM | POA: Diagnosis not present

## 2016-05-24 DIAGNOSIS — E1151 Type 2 diabetes mellitus with diabetic peripheral angiopathy without gangrene: Secondary | ICD-10-CM | POA: Diagnosis not present

## 2016-05-24 DIAGNOSIS — J690 Pneumonitis due to inhalation of food and vomit: Secondary | ICD-10-CM | POA: Diagnosis not present

## 2016-05-24 DIAGNOSIS — R531 Weakness: Secondary | ICD-10-CM | POA: Diagnosis not present

## 2016-05-24 DIAGNOSIS — R05 Cough: Secondary | ICD-10-CM | POA: Diagnosis not present

## 2016-05-24 LAB — CBC WITH DIFFERENTIAL/PLATELET
BASOS ABS: 0 10*3/uL (ref 0.0–0.1)
BASOS PCT: 0 %
EOS ABS: 0 10*3/uL (ref 0.0–0.7)
Eosinophils Relative: 0 %
HCT: 46 % (ref 39.0–52.0)
HEMOGLOBIN: 15.4 g/dL (ref 13.0–17.0)
LYMPHS PCT: 5 %
Lymphs Abs: 1.3 10*3/uL (ref 0.7–4.0)
MCH: 31.3 pg (ref 26.0–34.0)
MCHC: 33.5 g/dL (ref 30.0–36.0)
MCV: 93.5 fL (ref 78.0–100.0)
Monocytes Absolute: 1.6 10*3/uL — ABNORMAL HIGH (ref 0.1–1.0)
Monocytes Relative: 6 %
NEUTROS PCT: 89 %
Neutro Abs: 23.5 10*3/uL — ABNORMAL HIGH (ref 1.7–7.7)
Platelets: 250 10*3/uL (ref 150–400)
RBC: 4.92 MIL/uL (ref 4.22–5.81)
RDW: 13.8 % (ref 11.5–15.5)
WBC MORPHOLOGY: INCREASED
WBC: 26.4 10*3/uL — AB (ref 4.0–10.5)

## 2016-05-24 LAB — BASIC METABOLIC PANEL
ANION GAP: 10 (ref 5–15)
BUN: 20 mg/dL (ref 6–20)
CHLORIDE: 101 mmol/L (ref 101–111)
CO2: 24 mmol/L (ref 22–32)
Calcium: 8.9 mg/dL (ref 8.9–10.3)
Creatinine, Ser: 0.86 mg/dL (ref 0.61–1.24)
Glucose, Bld: 117 mg/dL — ABNORMAL HIGH (ref 65–99)
POTASSIUM: 4 mmol/L (ref 3.5–5.1)
SODIUM: 135 mmol/L (ref 135–145)

## 2016-05-24 LAB — LACTIC ACID, PLASMA
Lactic Acid, Venous: 4.1 mmol/L (ref 0.5–1.9)
Lactic Acid, Venous: 2.9 mmol/L (ref 0.5–1.9)

## 2016-05-24 MED ORDER — LEVOFLOXACIN 500 MG PO TABS: 500.0000 mg | ORAL_TABLET | Freq: Every day | ORAL | 0 refills | Status: DC

## 2016-05-24 MED ORDER — IPRATROPIUM-ALBUTEROL 0.5-2.5 (3) MG/3ML IN SOLN
3.0000 mL | Freq: Once | RESPIRATORY_TRACT | Status: AC
  Administered 2016-05-24: 3 mL via RESPIRATORY_TRACT
  Filled 2016-05-24: qty 3

## 2016-05-24 MED ORDER — DEXTROSE 5 % IV SOLN
500.0000 mg | INTRAVENOUS | Status: DC
  Administered 2016-05-24: 500 mg via INTRAVENOUS
  Filled 2016-05-24: qty 500

## 2016-05-24 MED ORDER — ALBUTEROL SULFATE (2.5 MG/3ML) 0.083% IN NEBU: 2.5000 mg | INHALATION_SOLUTION | Freq: Four times a day (QID) | RESPIRATORY_TRACT | 12 refills | Status: DC | PRN

## 2016-05-24 MED ORDER — DEXTROSE 5 % IV SOLN
1.0000 g | Freq: Once | INTRAVENOUS | Status: AC
  Administered 2016-05-24: 1 g via INTRAVENOUS
  Filled 2016-05-24: qty 10

## 2016-05-24 MED ORDER — SODIUM CHLORIDE 0.9 % IV BOLUS (SEPSIS)
1000.0000 mL | Freq: Once | INTRAVENOUS | Status: AC
  Administered 2016-05-24: 1000 mL via INTRAVENOUS

## 2016-05-24 NOTE — ED Provider Notes (Signed)
[facility] DEPT Provider Note   CSN: [id number] Arrival date & time: 05/24/16  [id number]  By signing my name below, I, Higinio Plan, attest that this documentation has been prepared under the direction and in the presence of Nat Christen, MD . Electronically Signed: Higinio Plan, Scribe. 05/24/2016. 12:26 PM.  History   Chief Complaint Chief Complaint  Patient presents with  . Cough   The history is provided by the patient. No language interpreter was used.   HPI Comments: Gregory Eaton is a 70 y.o. male with PMHx of COPD and HTN, who presents to the Emergency Department complaining of gradually worsening, productive cough with yellow-green sputum that began 3 days ago. Pt reports associated right sided chest pain secondary to his cough, weakness, decreased appetite, fatigue and vomiting. He states he was admitted to the hospital for pneumonia on 04/15/16 and states his symptoms feel similar. He notes is not currently on a home breathing treatment and states he quit smoking 20 years ago.   Past Medical History:  Diagnosis Date  . Adenomatous colon polyp 03/2009   Last colonoscopy by Dr. [name]   . Adenomatous polyp 2010  . Adenomatous polyp of colon 11/03/2010  . Barrett's esophagus   . CAD (coronary artery disease)   . COPD (chronic obstructive pulmonary disease) (HCC)    Severe emphysema per CT  . Diverticulosis   . DM type 2 (diabetes mellitus, type 2) ([place]) 04/09/2014   A1c 6.5 (04/08/14)   . Esophageal carcinoma ([place]) 03/2009   T1N1M0  . GERD (gastroesophageal reflux disease)   . History of Doppler ultrasound 11/09/2011   03/2014- 50-69% L ICA stenosis;carotid doppler; L bulb/prox ICA 0-49% diameter reduction; L vertebral artery - occlusive ds; L ECA  demonstrates severe amount of fibrous plaque  . History of Doppler ultrasound 11/09/11   LEAs; R ABI - mod art. insuff.; L ABI normal at rest; R SFA - occlusive ds; L SFA - occlusive ds; patent fem-pop graft  . History of echocardiogram  08/27/2009   EF >55%  . History of nuclear stress test 11/24/2011   lexiscan; normal perfusion; low risk scan; non-diagnostic for ischemia  . Hyperlipidemia   . Hypertension   . Left carotid artery stenosis 04/08/2014  . Pulmonary nodule, right 04/08/2014   2.8 mm-incidental finding on CT  . PVD (peripheral vascular disease) ([place])   . Tachyarrhythmia 1999   Status post ablation at [facility]  . Tobacco abuse     Patient Active Problem List   Diagnosis Date Noted  . Cerebrovascular disease 05/12/2016  . COPD GOLD II  07/18/2015  . S/P CABG x 5 01/30/2015  . Hyperlipidemia LDL goal <70 10/09/2014  . DM type 2 (diabetes mellitus, type 2) ([place]) 04/09/2014  . Left carotid artery stenosis 04/08/2014  . Pulmonary nodule, right 04/08/2014  . CAP (community acquired pneumonia) 04/07/2014  . Acute dyspnea 04/07/2014  . Hypoglycemia 04/07/2014  . Near syncope 04/07/2014  . COPD with emphysema ([place]) 04/07/2014  . Adenomatous polyp of colon 11/03/2010  . Esophageal carcinoma ([place]) 03/19/2009  . BARRETTS ESOPHAGUS 03/11/2009  . CONSTIPATION 03/11/2009  . Essential hypertension 03/10/2009  . RECTAL BLEEDING 03/10/2009  . LIPOMA 02/03/2009  . HYPERLIPIDEMIA 02/03/2009  . Coronary atherosclerosis 02/03/2009  . Peripheral vascular disease ([place]) 02/03/2009  . GERD 02/03/2009  . HEMATOCHEZIA 02/03/2009  . CAROTID BRUIT, RIGHT 02/03/2009    Past Surgical History:  Procedure Laterality Date  . COLONOSCOPY  03/17/2009   Dr.Rourk- normal rectum, sigmoid diverticula,  some pale sigmoid mucosa with diffuse petechiae. pedunculated polyp at the splenic flexure, remainder of colonic mucosa appeared normal. bx= adenomatous polyp  . COLONOSCOPY  04/19/2003   Dr.Rehman- few diverticu;a at the sigmoid colon, 3 small polyps, one at the transverse colon and 2 at the sigmoid, small external hemorrhoids. bx report not available.   . CORONARY ARTERY BYPASS GRAFT  1998   [name]  . ESOPHAGECTOMY  2010   [facility]  Dr. [name]  . ESOPHAGOGASTRODUODENOSCOPY  11/25/2010   Dr.Rourk- s/p esophagectomy with gastric pull-up, esophageal erosions straddling the surgical anastomosis, salmon colored epithelium coming up a good centimeter to a centimeter and a half above the suture line, islands of salmon colored epithelium in the most poximal residual esophagus, remainder of gastric mucosa appeared normal. bx= swamous &gastric glandular mucosa w/chronic active inflammation  . ESOPHAGOGASTRODUODENOSCOPY  03/17/2009   Dr.Rourk- 4cm segment of salmon-colored epithlium distal esophagus suspicious for barretts esophagus. area of suspicious nodularity w/in this segment bx seperately. small to moderate size hiatal hernia, o/w normal stomach D1 and D2 bx=adenocarcinoma  . FEMORAL-POPLITEAL BYPASS GRAFT  1993   occlusive ds in R SFA  . GASTRIC PULL THROUGH  2010   With esophagectomy  . LIPOMA EXCISION  2010    Home Medications    Prior to Admission medications   Medication Sig Start Date End Date Taking? Authorizing Provider  ALPRAZolam Duanne Moron) 0.5 MG tablet Take 1 tablet (0.5 mg total) by mouth at bedtime. 10/07/14  Yes Troy Sine, MD  amLODipine (NORVASC) 5 MG tablet TAKE ONE (1) TABLET BY MOUTH EVERY DAY 06/18/15  Yes Troy Sine, MD  aspirin 81 MG EC tablet Take 81 mg by mouth daily.     Yes Historical Provider, MD  budesonide-formoterol (SYMBICORT) 160-4.5 MCG/ACT inhaler Inhale 2 puffs into the lungs 2 (two) times daily. 05/12/16 05/26/16 Yes Noralee Space, MD  clopidogrel (PLAVIX) 75 MG tablet TAKE ONE (1) TABLET BY MOUTH EVERY DAY 11/27/15  Yes Troy Sine, MD  latanoprost (XALATAN) 0.005 % ophthalmic solution Place 1 drop into both eyes at bedtime.  01/10/13  Yes Historical Provider, MD  losartan (COZAAR) 50 MG tablet Take 1.5 tablets (75 mg total) by mouth daily. 05/18/16  Yes Troy Sine, MD  metoprolol succinate (TOPROL-XL) 50 MG 24 hr tablet Take 1 tablet (50 mg total) by mouth daily. 05/18/16  Yes  Troy Sine, MD  montelukast (SINGULAIR) 10 MG tablet Take 1 tablet by mouth daily. 09/10/14  Yes Historical Provider, MD  Multiple Vitamins-Minerals (CENTRUM SILVER PO) Take 1 tablet by mouth daily.     Yes Historical Provider, MD  nitroGLYCERIN (NITROSTAT) 0.4 MG SL tablet Place 1 tablet (0.4 mg total) under the tongue every 5 (five) minutes as needed for chest pain. 01/29/13  Yes Troy Sine, MD  pantoprazole (PROTONIX) 40 MG tablet TAKE ONE (1) TABLET EACH DAY 10/15/15  Yes Troy Sine, MD  ranitidine (ZANTAC) 150 MG tablet Take 150 mg by mouth at bedtime.   Yes Historical Provider, MD  rosuvastatin (CRESTOR) 10 MG tablet TAKE ONE (1) TABLET BY MOUTH EVERY DAY 01/09/16  Yes Troy Sine, MD  sucralfate (CARAFATE) 1 G tablet Take 1 tablet (1 g total) by mouth 4 (four) times daily -  with meals and at bedtime. 03/15/14  Yes Milton Ferguson, MD  timolol (TIMOPTIC) 0.5 % ophthalmic solution Place 1 drop into both eyes daily.  01/10/13  Yes Historical Provider, MD  tiotropium ([place]) 18  MCG inhalation capsule Place 1 capsule (18 mcg total) into inhaler and inhale daily. 03/23/16  Yes Noralee Space, MD  albuterol (PROVENTIL) (2.5 MG/3ML) 0.083% nebulizer solution Take 3 mLs (2.5 mg total) by nebulization every 6 (six) hours as needed for wheezing or shortness of breath. Patient will also need nebulizer machine please 05/24/16   Nat Christen, MD  levofloxacin (LEVAQUIN) 500 MG tablet Take 1 tablet (500 mg total) by mouth daily. 05/24/16   Nat Christen, MD    Family History Family History  Problem Relation Age of Onset  . GER disease Mother   . Coronary artery disease Brother   . Congenital heart disease Sister     Social History Social History  Substance Use Topics  . Smoking status: Former Smoker    Packs/day: 2.00    Years: 40.00    Types: Cigarettes    Quit date: 07/20/1995  . Smokeless tobacco: Never Used  . Alcohol use No     Comment: occassional     Allergies   Altace [ramipril]  and Prednisone   Review of Systems Review of Systems  Constitutional: Positive for appetite change and fatigue.  Respiratory: Positive for cough.   Cardiovascular: Positive for chest pain.  Gastrointestinal: Positive for vomiting.  Neurological: Positive for weakness.   Physical Exam Updated Vital Signs BP (!) 126/54 (BP Location: Left Arm)   Pulse 106   Temp 97.8 F (36.6 C) (Oral)   Resp 16   Ht 5\' 8"  (1.727 m)   Wt 160 lb (72.6 kg)   SpO2 91%   BMI 24.33 kg/m   Physical Exam  Constitutional: He is oriented to person, place, and time. He appears well-developed and well-nourished.  HENT:  Head: Normocephalic and atraumatic.  Eyes: Conjunctivae are normal.  Neck: Neck supple.  Cardiovascular: Normal rate and regular rhythm.   Pulmonary/Chest:  Decreased breath sounds on right, mildly dyspneic,   Abdominal: Soft. Bowel sounds are normal.  Musculoskeletal: Normal range of motion.  Neurological: He is alert and oriented to person, place, and time.  Skin: Skin is warm and dry.  Psychiatric: He has a normal mood and affect. His behavior is normal.  Nursing note and vitals reviewed.  ED Treatments / Results  Labs (all labs ordered are listed, but only abnormal results are displayed) Labs Reviewed  CBC WITH DIFFERENTIAL/PLATELET - Abnormal; Notable for the following:       Result Value   WBC 26.4 (*)    Neutro Abs 23.5 (*)    Monocytes Absolute 1.6 (*)    All other components within normal limits  BASIC METABOLIC PANEL - Abnormal; Notable for the following:    Glucose, Bld 117 (*)    All other components within normal limits  LACTIC ACID, PLASMA - Abnormal; Notable for the following:    Lactic Acid, Venous 4.1 (*)    All other components within normal limits  LACTIC ACID, PLASMA - Abnormal; Notable for the following:    Lactic Acid, Venous 2.9 (*)    All other components within normal limits  CULTURE, BLOOD (ROUTINE X 2)  CULTURE, BLOOD (ROUTINE X 2)    EKG   EKG Interpretation None       Radiology Dg Chest 2 View  Result Date: 05/24/2016 CLINICAL DATA:  Pt comes in with productive cough starting 3 days ago. Pt states he is coughing up yellow and green sputum. Pt has hx of COPD. Wheezing noted to RLL. NAD noted EXAM: CHEST  2 VIEW  COMPARISON:  05/12/2016 FINDINGS: Hyperinflation. Prior median sternotomy. Moderate thoracic spondylosis. Midline trachea. Normal heart size. Atherosclerosis in the transverse aorta. No pleural effusion or pneumothorax. Diffuse peribronchial thickening. Right middle lobe airspace disease is new. Left base volume loss and scarring. IMPRESSION: Right middle lobe airspace disease, consistent with pneumonia. Underlying hyperinflation is consistent with COPD. Electronically Signed   By: Abigail Miyamoto M.D.   On: 05/24/2016 10:31    Procedures Procedures (including critical care time)  Medications Ordered in ED Medications  azithromycin (ZITHROMAX) 500 mg in dextrose 5 % 250 mL IVPB (0 mg Intravenous Stopped 05/24/16 1426)  sodium chloride 0.9 % bolus 1,000 mL (0 mLs Intravenous Stopped 05/24/16 1426)  ipratropium-albuterol (DUONEB) 0.5-2.5 (3) MG/3ML nebulizer solution 3 mL (3 mLs Nebulization Given 05/24/16 1308)  cefTRIAXone (ROCEPHIN) 1 g in dextrose 5 % 50 mL IVPB (0 g Intravenous Stopped 05/24/16 1321)  sodium chloride 0.9 % bolus 1,000 mL (0 mLs Intravenous Stopped 05/24/16 1614)    DIAGNOSTIC STUDIES:  Oxygen Saturation is 91% on RA, low by my interpretation.    COORDINATION OF CARE:  12:23 PM Discussed treatment plan with pt at bedside and pt agreed to plan.  Initial Impression / Assessment and Plan / ED Course  I have reviewed the triage vital signs and the nursing notes.  Pertinent labs & imaging results that were available during my care of the patient were reviewed by me and considered in my medical decision making (see chart for details).  Clinical Course      Will start pt on IV antibiotics and give pt  a breathing treatment. Will consider sending pt home.   Patient given 2 L of IV fluid, IV Rocephin, IV Zithromax. We have discussed the possibility of admission of the hospital. He prefers to be treated as an outpatient. His lactate is on a negative slope. Discharge medications Levaquin 500 milligrams and albuterol nebulizer.  Patient understands to return if worse. These instructions were discussed with the patient, his wife and family members.  I personally performed the services described in this documentation, which was scribed in my presence. The recorded information has been reviewed and is accurate.   Final Clinical Impressions(s) / ED Diagnoses   Final diagnoses:  Community acquired pneumonia of right lung, unspecified part of lung    New Prescriptions New Prescriptions   ALBUTEROL (PROVENTIL) (2.5 MG/3ML) 0.083% NEBULIZER SOLUTION    Take 3 mLs (2.5 mg total) by nebulization every 6 (six) hours as needed for wheezing or shortness of breath. Patient will also need nebulizer machine please   LEVOFLOXACIN (LEVAQUIN) 500 MG TABLET    Take 1 tablet (500 mg total) by mouth daily.     Nat Christen, MD 05/24/16 [id number]

## 2016-05-24 NOTE — ED Triage Notes (Signed)
Pt comes in with productive cough starting 3 days ago. Pt states he is coughing up yellow and green sputum. Pt has hx of COPD. Wheezing noted to RLL. NAD noted

## 2016-05-24 NOTE — ED Notes (Signed)
CRITICAL VALUE ALERT  Critical value received:  Lactic acid 2.9  Date of notification:  05/24/16  Time of notification:  1614  Critical value read back:Yes.    Nurse who received alert:  c Nabeel Gladson  Responding MD:  Dr Lacinda Axon  Time MD responded:  [phone]

## 2016-05-24 NOTE — ED Notes (Signed)
CRITICAL VALUE ALERT  Critical value received:  lactc acid 4.1  Date of notification:  05/25/16  Time of notification:  [id number]  Critical value read back:Yes.    Nurse who received alert:  c Seletha Zimmermann rn  Responding MD:  Dr Lacinda Axon  Time MD responded:  1336

## 2016-05-24 NOTE — Discharge Instructions (Signed)
You have pneumonia in your right lung. Increase fluids. Prescription for antibiotic and medication for your nebulizer machine.  Return here if worse.

## 2016-05-25 ENCOUNTER — Inpatient Hospital Stay (HOSPITAL_COMMUNITY)
Admission: EM | Admit: 2016-05-25 | Discharge: 2016-06-03 | DRG: 871 | Disposition: A | Discharge status: Home / Self Care | Payer: Medicare Other | Attending: Internal Medicine | Admitting: Internal Medicine

## 2016-05-25 ENCOUNTER — Encounter (HOSPITAL_COMMUNITY): Payer: Self-pay | Admitting: Emergency Medicine

## 2016-05-25 DIAGNOSIS — E876 Hypokalemia: Secondary | ICD-10-CM | POA: Diagnosis not present

## 2016-05-25 DIAGNOSIS — J690 Pneumonitis due to inhalation of food and vomit: Secondary | ICD-10-CM | POA: Diagnosis present

## 2016-05-25 DIAGNOSIS — J439 Emphysema, unspecified: Secondary | ICD-10-CM | POA: Diagnosis present

## 2016-05-25 DIAGNOSIS — J449 Chronic obstructive pulmonary disease, unspecified: Secondary | ICD-10-CM | POA: Diagnosis present

## 2016-05-25 DIAGNOSIS — K219 Gastro-esophageal reflux disease without esophagitis: Secondary | ICD-10-CM | POA: Diagnosis present

## 2016-05-25 DIAGNOSIS — E1101 Type 2 diabetes mellitus with hyperosmolarity with coma: Secondary | ICD-10-CM | POA: Diagnosis not present

## 2016-05-25 DIAGNOSIS — E119 Type 2 diabetes mellitus without complications: Secondary | ICD-10-CM

## 2016-05-25 DIAGNOSIS — A419 Sepsis, unspecified organism: Principal | ICD-10-CM | POA: Diagnosis present

## 2016-05-25 DIAGNOSIS — R0602 Shortness of breath: Secondary | ICD-10-CM | POA: Diagnosis not present

## 2016-05-25 DIAGNOSIS — Z79899 Other long term (current) drug therapy: Secondary | ICD-10-CM

## 2016-05-25 DIAGNOSIS — Z7902 Long term (current) use of antithrombotics/antiplatelets: Secondary | ICD-10-CM | POA: Diagnosis not present

## 2016-05-25 DIAGNOSIS — I10 Essential (primary) hypertension: Secondary | ICD-10-CM | POA: Diagnosis present

## 2016-05-25 DIAGNOSIS — Z95828 Presence of other vascular implants and grafts: Secondary | ICD-10-CM | POA: Diagnosis not present

## 2016-05-25 DIAGNOSIS — I2510 Atherosclerotic heart disease of native coronary artery without angina pectoris: Secondary | ICD-10-CM | POA: Diagnosis present

## 2016-05-25 DIAGNOSIS — Z951 Presence of aortocoronary bypass graft: Secondary | ICD-10-CM | POA: Diagnosis not present

## 2016-05-25 DIAGNOSIS — Z9981 Dependence on supplemental oxygen: Secondary | ICD-10-CM | POA: Diagnosis not present

## 2016-05-25 DIAGNOSIS — E1151 Type 2 diabetes mellitus with diabetic peripheral angiopathy without gangrene: Secondary | ICD-10-CM | POA: Diagnosis present

## 2016-05-25 DIAGNOSIS — Z7951 Long term (current) use of inhaled steroids: Secondary | ICD-10-CM | POA: Diagnosis not present

## 2016-05-25 DIAGNOSIS — J189 Pneumonia, unspecified organism: Secondary | ICD-10-CM | POA: Diagnosis not present

## 2016-05-25 DIAGNOSIS — Z7982 Long term (current) use of aspirin: Secondary | ICD-10-CM

## 2016-05-25 DIAGNOSIS — E785 Hyperlipidemia, unspecified: Secondary | ICD-10-CM | POA: Diagnosis present

## 2016-05-25 DIAGNOSIS — Z87891 Personal history of nicotine dependence: Secondary | ICD-10-CM | POA: Diagnosis not present

## 2016-05-25 LAB — COMPREHENSIVE METABOLIC PANEL
ALBUMIN: 3.1 g/dL — AB (ref 3.5–5.0)
ALT: 13 U/L — ABNORMAL LOW (ref 17–63)
ANION GAP: 7 (ref 5–15)
AST: 14 U/L — ABNORMAL LOW (ref 15–41)
Alkaline Phosphatase: 63 U/L (ref 38–126)
BILIRUBIN TOTAL: 0.9 mg/dL (ref 0.3–1.2)
BUN: 11 mg/dL (ref 6–20)
CO2: 25 mmol/L (ref 22–32)
Calcium: 8.6 mg/dL — ABNORMAL LOW (ref 8.9–10.3)
Chloride: 102 mmol/L (ref 101–111)
Creatinine, Ser: 0.84 mg/dL (ref 0.61–1.24)
GFR calc Af Amer: >60 mL/min (ref >60)
Glucose, Bld: 114 mg/dL — ABNORMAL HIGH (ref 65–99)
POTASSIUM: 3.8 mmol/L (ref 3.5–5.1)
Sodium: 134 mmol/L — ABNORMAL LOW (ref 135–145)
TOTAL PROTEIN: 6.8 g/dL (ref 6.5–8.1)

## 2016-05-25 LAB — CBC WITH DIFFERENTIAL/PLATELET
BASOS PCT: 0 %
Basophils Absolute: 0 10*3/uL (ref 0.0–0.1)
EOS PCT: 0 %
Eosinophils Absolute: 0 10*3/uL (ref 0.0–0.7)
HEMATOCRIT: 39.3 % (ref 39.0–52.0)
Hemoglobin: 13.2 g/dL (ref 13.0–17.0)
Lymphocytes Relative: 6 %
Lymphs Abs: 1.2 10*3/uL (ref 0.7–4.0)
MCH: 30.9 pg (ref 26.0–34.0)
MCHC: 33.6 g/dL (ref 30.0–36.0)
MCV: 92 fL (ref 78.0–100.0)
MONO ABS: 1.4 10*3/uL — AB (ref 0.1–1.0)
MONOS PCT: 6 %
NEUTROS ABS: 18.8 10*3/uL — AB (ref 1.7–7.7)
Neutrophils Relative %: 88 %
Platelets: 222 10*3/uL (ref 150–400)
RBC: 4.27 MIL/uL (ref 4.22–5.81)
RDW: 13.5 % (ref 11.5–15.5)
WBC: 21.4 10*3/uL — ABNORMAL HIGH (ref 4.0–10.5)

## 2016-05-25 LAB — BLOOD CULTURE ID PANEL (REFLEXED)
Acinetobacter baumannii: NOT DETECTED
CANDIDA GLABRATA: NOT DETECTED
Candida albicans: NOT DETECTED
Candida krusei: NOT DETECTED
Candida parapsilosis: NOT DETECTED
Candida tropicalis: NOT DETECTED
ENTEROBACTER CLOACAE COMPLEX: NOT DETECTED
ENTEROBACTERIACEAE SPECIES: NOT DETECTED
ENTEROCOCCUS SPECIES: NOT DETECTED
Escherichia coli: NOT DETECTED
HAEMOPHILUS INFLUENZAE: NOT DETECTED
Klebsiella oxytoca: NOT DETECTED
Klebsiella pneumoniae: NOT DETECTED
LISTERIA MONOCYTOGENES: NOT DETECTED
METHICILLIN RESISTANCE: NOT DETECTED
NEISSERIA MENINGITIDIS: NOT DETECTED
PROTEUS SPECIES: NOT DETECTED
Pseudomonas aeruginosa: NOT DETECTED
STAPHYLOCOCCUS SPECIES: DETECTED — AB
STREPTOCOCCUS AGALACTIAE: NOT DETECTED
STREPTOCOCCUS SPECIES: NOT DETECTED
Serratia marcescens: NOT DETECTED
Staphylococcus aureus (BCID): NOT DETECTED
Streptococcus pneumoniae: NOT DETECTED
Streptococcus pyogenes: NOT DETECTED

## 2016-05-25 LAB — I-STAT CG4 LACTIC ACID, ED
LACTIC ACID, VENOUS: 1.8 mmol/L (ref 0.5–1.9)
Lactic Acid, Venous: 0.99 mmol/L (ref 0.5–1.9)

## 2016-05-25 LAB — GLUCOSE, CAPILLARY
GLUCOSE-CAPILLARY: 96 mg/dL (ref 65–99)
Glucose-Capillary: 123 mg/dL — ABNORMAL HIGH (ref 65–99)

## 2016-05-25 MED ORDER — LOSARTAN POTASSIUM 25 MG PO TABS
25.0000 mg | ORAL_TABLET | Freq: Every day | ORAL | Status: DC
  Administered 2016-05-26 – 2016-06-03 (×9): 25 mg via ORAL
  Filled 2016-05-25 (×10): qty 1

## 2016-05-25 MED ORDER — SUCRALFATE 1 G PO TABS
1.0000 g | ORAL_TABLET | Freq: Three times a day (TID) | ORAL | Status: DC
  Administered 2016-05-25 – 2016-06-03 (×35): 1 g via ORAL
  Filled 2016-05-25 (×35): qty 1

## 2016-05-25 MED ORDER — PANTOPRAZOLE SODIUM 40 MG PO TBEC
40.0000 mg | DELAYED_RELEASE_TABLET | Freq: Two times a day (BID) | ORAL | Status: DC
  Administered 2016-05-25 – 2016-06-03 (×18): 40 mg via ORAL
  Filled 2016-05-25 (×18): qty 1

## 2016-05-25 MED ORDER — CLOPIDOGREL BISULFATE 75 MG PO TABS
75.0000 mg | ORAL_TABLET | Freq: Every day | ORAL | Status: DC
  Administered 2016-05-26 – 2016-06-03 (×9): 75 mg via ORAL
  Filled 2016-05-25 (×9): qty 1

## 2016-05-25 MED ORDER — METOCLOPRAMIDE HCL 10 MG PO TABS
5.0000 mg | ORAL_TABLET | Freq: Three times a day (TID) | ORAL | Status: DC
  Administered 2016-05-25 – 2016-06-02 (×23): 5 mg via ORAL
  Administered 2016-06-02: 10 mg via ORAL
  Administered 2016-06-02 – 2016-06-03 (×2): 5 mg via ORAL
  Filled 2016-05-25 (×26): qty 1

## 2016-05-25 MED ORDER — ACETAMINOPHEN 325 MG PO TABS
650.0000 mg | ORAL_TABLET | Freq: Once | ORAL | Status: AC
  Administered 2016-05-25: 650 mg via ORAL
  Filled 2016-05-25: qty 2

## 2016-05-25 MED ORDER — ALBUTEROL SULFATE (2.5 MG/3ML) 0.083% IN NEBU
2.5000 mg | INHALATION_SOLUTION | Freq: Four times a day (QID) | RESPIRATORY_TRACT | Status: DC
  Administered 2016-05-25 – 2016-05-30 (×20): 2.5 mg via RESPIRATORY_TRACT
  Filled 2016-05-25 (×20): qty 3

## 2016-05-25 MED ORDER — TIOTROPIUM BROMIDE MONOHYDRATE 18 MCG IN CAPS
18.0000 ug | ORAL_CAPSULE | Freq: Every day | RESPIRATORY_TRACT | Status: DC
  Administered 2016-05-26 – 2016-06-03 (×9): 18 ug via RESPIRATORY_TRACT
  Filled 2016-05-25 (×3): qty 5

## 2016-05-25 MED ORDER — METOPROLOL SUCCINATE ER 50 MG PO TB24
50.0000 mg | ORAL_TABLET | Freq: Every day | ORAL | Status: DC
  Administered 2016-05-26 – 2016-06-03 (×9): 50 mg via ORAL
  Filled 2016-05-25 (×9): qty 1

## 2016-05-25 MED ORDER — ALPRAZOLAM 0.5 MG PO TABS
0.5000 mg | ORAL_TABLET | Freq: Every day | ORAL | Status: DC
  Administered 2016-05-25 – 2016-06-02 (×9): 0.5 mg via ORAL
  Filled 2016-05-25 (×9): qty 1

## 2016-05-25 MED ORDER — SODIUM CHLORIDE 0.9 % IV BOLUS (SEPSIS)
500.0000 mL | Freq: Once | INTRAVENOUS | Status: AC
  Administered 2016-05-25: 500 mL via INTRAVENOUS

## 2016-05-25 MED ORDER — LATANOPROST 0.005 % OP SOLN
1.0000 [drp] | Freq: Every day | OPHTHALMIC | Status: DC
Start: 2016-05-25 — End: 2016-06-03
  Administered 2016-05-25 – 2016-06-02 (×9): 1 [drp] via OPHTHALMIC
  Filled 2016-05-25: qty 2.5

## 2016-05-25 MED ORDER — ASPIRIN EC 81 MG PO TBEC
81.0000 mg | DELAYED_RELEASE_TABLET | Freq: Every day | ORAL | Status: DC
  Administered 2016-05-26 – 2016-06-03 (×9): 81 mg via ORAL
  Filled 2016-05-25 (×9): qty 1

## 2016-05-25 MED ORDER — ROSUVASTATIN CALCIUM 10 MG PO TABS
10.0000 mg | ORAL_TABLET | Freq: Every day | ORAL | Status: DC
  Administered 2016-05-26 – 2016-06-03 (×9): 10 mg via ORAL
  Filled 2016-05-25 (×9): qty 1

## 2016-05-25 MED ORDER — VANCOMYCIN HCL IN DEXTROSE 1-5 GM/200ML-% IV SOLN
1000.0000 mg | Freq: Two times a day (BID) | INTRAVENOUS | Status: DC
  Administered 2016-05-25 – 2016-05-26 (×2): 1000 mg via INTRAVENOUS
  Filled 2016-05-25 (×2): qty 200

## 2016-05-25 MED ORDER — HEPARIN SODIUM (PORCINE) 5000 UNIT/ML IJ SOLN
5000.0000 [IU] | Freq: Three times a day (TID) | INTRAMUSCULAR | Status: DC
  Administered 2016-05-25 – 2016-06-03 (×26): 5000 [IU] via SUBCUTANEOUS
  Filled 2016-05-25 (×25): qty 1

## 2016-05-25 MED ORDER — MOMETASONE FURO-FORMOTEROL FUM 200-5 MCG/ACT IN AERO
2.0000 | INHALATION_SPRAY | Freq: Two times a day (BID) | RESPIRATORY_TRACT | Status: DC
  Administered 2016-05-25 – 2016-06-03 (×17): 2 via RESPIRATORY_TRACT
  Filled 2016-05-25: qty 8.8

## 2016-05-25 MED ORDER — ONDANSETRON HCL 4 MG/2ML IJ SOLN
4.0000 mg | Freq: Four times a day (QID) | INTRAMUSCULAR | Status: DC | PRN
  Administered 2016-05-25: 4 mg via INTRAVENOUS
  Filled 2016-05-25: qty 2

## 2016-05-25 MED ORDER — PIPERACILLIN-TAZOBACTAM 3.375 G IVPB 30 MIN
3.3750 g | Freq: Once | INTRAVENOUS | Status: AC
  Administered 2016-05-25: 3.375 g via INTRAVENOUS
  Filled 2016-05-25: qty 50

## 2016-05-25 MED ORDER — POLYETHYLENE GLYCOL 3350 17 G PO PACK
17.0000 g | PACK | Freq: Two times a day (BID) | ORAL | Status: DC
  Administered 2016-05-25 – 2016-05-31 (×7): 17 g via ORAL
  Filled 2016-05-25 (×16): qty 1

## 2016-05-25 MED ORDER — SODIUM CHLORIDE 0.9 % IV SOLN
INTRAVENOUS | Status: AC
  Administered 2016-05-25: 17:00:00 via INTRAVENOUS

## 2016-05-25 MED ORDER — NITROGLYCERIN 0.4 MG SL SUBL: 0.4000 mg | SUBLINGUAL_TABLET | SUBLINGUAL | Status: DC | PRN

## 2016-05-25 MED ORDER — MONTELUKAST SODIUM 10 MG PO TABS
10.0000 mg | ORAL_TABLET | Freq: Every day | ORAL | Status: DC
  Administered 2016-05-26 – 2016-06-03 (×9): 10 mg via ORAL
  Filled 2016-05-25 (×9): qty 1

## 2016-05-25 MED ORDER — TIMOLOL MALEATE 0.5 % OP SOLN
1.0000 [drp] | Freq: Every day | OPHTHALMIC | Status: DC
  Administered 2016-05-26 – 2016-06-03 (×9): 1 [drp] via OPHTHALMIC
  Filled 2016-05-25 (×2): qty 5

## 2016-05-25 MED ORDER — INSULIN ASPART 100 UNIT/ML ~~LOC~~ SOLN
0.0000 [IU] | Freq: Three times a day (TID) | SUBCUTANEOUS | Status: DC
  Administered 2016-05-26 – 2016-06-02 (×9): 1 [IU] via SUBCUTANEOUS

## 2016-05-25 MED ORDER — VANCOMYCIN HCL IN DEXTROSE 1-5 GM/200ML-% IV SOLN
1000.0000 mg | Freq: Once | INTRAVENOUS | Status: AC
  Administered 2016-05-25: 1000 mg via INTRAVENOUS
  Filled 2016-05-25: qty 200

## 2016-05-25 MED ORDER — PIPERACILLIN-TAZOBACTAM 3.375 G IVPB
3.3750 g | Freq: Three times a day (TID) | INTRAVENOUS | Status: DC
  Administered 2016-05-25 – 2016-05-28 (×9): 3.375 g via INTRAVENOUS
  Filled 2016-05-25 (×9): qty 50

## 2016-05-25 NOTE — ED Notes (Signed)
Pt got up the the bathroom and once he returned to bed, his O2 was %85-%88. Placed pt on 2L O2. NAD noted.

## 2016-05-25 NOTE — ED Triage Notes (Signed)
PT was in the ED yesterday and dx with pneumonia. PT was called this am and told to return to ED for results of a positive blood culture test that was done yesterday. PT states continued body aches, SOB on exertion and generalized body aches x4 days.

## 2016-05-25 NOTE — Progress Notes (Signed)
Pharmacy Antibiotic Note  Gregory Eaton is a 70 y.o. male admitted on 05/25/2016 with Aspiration Pneumonia.  Pharmacy has been consulted for Vancomycin and Zosyn dosing.  Plan: Vancomycin 1000mg  IV every 12 hours.  Goal trough 15-20 mcg/mL. Zosyn 3.375g IV q8h (4 hour infusion).  F/U cultures and monitor clinical progress Vancomycin trough as indicated  Height: 5\' 8"  (172.7 cm) Weight: 160 lb (72.6 kg) IBW/kg (Calculated) : 68.4  Temp (24hrs), Avg:98.3 F (36.8 C), Min:98.1 F (36.7 C), Max:98.5 F (36.9 C)   Recent Labs Lab 05/24/16 1230 05/24/16 1231 05/24/16 1534 05/25/16 1119 05/25/16 1129 05/25/16 1434  WBC 26.4*  --   --  21.4*  --   --   CREATININE 0.86  --   --  0.84  --   --   LATICACIDVEN  --  4.1* 2.9*  --  1.80 0.99    Estimated Creatinine Clearance: 78 mL/min (by C-G formula based on SCr of 0.84 mg/dL).    Allergies  Allergen Reactions  . Altace [Ramipril] Cough  . Prednisone Other (See Comments)    "throat broke out"     Antimicrobials this admission: Vancomycin 11/7>>  Zosyn 11/7 >>  Ceftriaxone 11/6 x 1 dose in ED  Dose adjustments this admission: n/a  Microbiology results: 11/6 BCx: ngtd  Thank you for allowing pharmacy to be a part of this patient's care.  Isac Sarna, BS [name], [place] Clinical Pharmacist Pager [phone] 05/25/2016 3:36 PM

## 2016-05-25 NOTE — H&P (Signed)
[name] H&P   Patient Demographics:    Gregory Eaton, is a 70 y.o. male  MRN: [id number]   DOB - May 15, 1946  Admit Date - 05/25/2016  Outpatient Primary MD for the patient is Purvis Kilts, MD  Outpatient Specialists: Dr Lenna Gilford, Dr [name], [place]  Patient coming from: Home   Chief Complaint  Patient presents with  . Pneumonia      HPI:    Gregory Eaton  is a 70 y.o. male, with a history of insufficient cancer which is being treated, COPD, DM type 2, and CAD s/p CABG, who was admitted yesterday for possible aspiration in which he has severe ongoing reflux. He was found to have pneumonia in which he left AMA. He has returned to the ED After he was called by the ER with a positive blood culture result which came from yesterday's visit. He is being admitted for further evaluation of sepsis secondary to pneumonia.   He denies fever chills, he does feel weak all over, he is positive for cough and shortness of breath his cough is productive, he says that on Saturday he woke up from sleep with a large amount of food that had regurgitated it in his mouth and he subsequently aspirated and since then he's been feeling sick. This is a ongoing problem for which she is being followed by GI physician Dr. [name] and he has a upcoming EGD in the next few weeks.  He denies any chest pain, no abdominal pain, no diarrhea, he is infected and constipated, no blood in stool or urine, no focal weakness, no new skin rashes or bruises.    Review of systems:    In addition to the HPI above,  No Fever-chills, No Headache, No changes with Vision or hearing, No problems swallowing food or Liquids, No Chest pain, Positive Cough  and Shortness of Breath,  No Abdominal pain, No Nausea or Vommitting, Bowel movements are regular, No Blood in stool or Urine, No dysuria, No new skin rashes or bruises, No new joints pains-aches,  No new weakness, tingling, numbness in any extremity, positive generalized weakness No recent weight gain or loss, No polyuria, polydypsia or polyphagia, No significant Mental Stressors.  A full 10 point Review of Systems was done, except as stated above, all other Review of Systems were negative.   With Past History of the following :    Past Medical History:  Diagnosis Date  . Adenomatous colon polyp 03/2009   Last colonoscopy by Dr. [name]   . Adenomatous polyp 2010  . Adenomatous polyp of colon 11/03/2010  . Barrett's esophagus   . CAD (coronary artery disease)   . COPD (chronic obstructive pulmonary disease) (HCC)    Severe emphysema per CT  . Diverticulosis   . DM type 2 (diabetes mellitus, type 2) ([place]) 04/09/2014   A1c 6.5 (04/08/14)   . Esophageal carcinoma ([place]) 03/2009   T1N1M0  . GERD (gastroesophageal reflux disease)   . History of Doppler ultrasound 11/09/2011   03/2014- 50-69% L ICA stenosis;carotid doppler; L bulb/prox ICA 0-49% diameter reduction; L vertebral artery - occlusive ds; L ECA  demonstrates severe amount of fibrous plaque  . History of Doppler ultrasound 11/09/11   LEAs; R ABI - mod art. insuff.; L ABI normal at rest; R SFA - occlusive ds; L SFA - occlusive ds; patent fem-pop graft  . History of echocardiogram 08/27/2009   EF >55%  . History of nuclear stress test 11/24/2011   lexiscan; normal perfusion; low risk scan; non-diagnostic for ischemia  . Hyperlipidemia   . Hypertension   . Left carotid artery stenosis 04/08/2014  . Pulmonary nodule, right 04/08/2014   2.8 mm-incidental finding on CT  . PVD (peripheral vascular disease) ([place])   . Tachyarrhythmia 1999   Status post ablation at [facility]  . Tobacco abuse       Past Surgical History:  Procedure  Laterality Date  . COLONOSCOPY  03/17/2009   Dr.Rourk- normal rectum, sigmoid diverticula, some pale sigmoid mucosa with diffuse petechiae. pedunculated polyp at the splenic flexure, remainder of colonic mucosa appeared normal. bx= adenomatous polyp  . COLONOSCOPY  04/19/2003   Dr.Rehman- few diverticu;a at the sigmoid colon, 3 small polyps, one at the transverse colon and 2 at the sigmoid, small external hemorrhoids. bx report not available.   . CORONARY ARTERY BYPASS GRAFT  1998   [name]  . ESOPHAGECTOMY  2010   [facility] Dr. [name]  . ESOPHAGOGASTRODUODENOSCOPY  11/25/2010   Dr.Rourk- s/p esophagectomy with gastric pull-up, esophageal erosions straddling the surgical anastomosis, salmon colored epithelium coming up a good centimeter to a centimeter and a half above the suture line, islands of salmon colored epithelium in the most poximal residual esophagus, remainder of gastric mucosa appeared normal. bx= swamous &gastric glandular mucosa w/chronic active inflammation  . ESOPHAGOGASTRODUODENOSCOPY  03/17/2009   Dr.Rourk- 4cm segment of salmon-colored epithlium distal esophagus suspicious for barretts esophagus. area of suspicious nodularity w/in this segment bx seperately. small to moderate size hiatal hernia, o/w normal stomach D1 and D2 bx=adenocarcinoma  . FEMORAL-POPLITEAL BYPASS GRAFT  1993   occlusive ds in R SFA  . GASTRIC PULL THROUGH  2010   With esophagectomy  . LIPOMA EXCISION  2010      Social History:     Social History  Substance Use Topics  . Smoking status: Former Smoker  Packs/day: 2.00    Years: 40.00    Types: Cigarettes    Quit date: 07/20/1995  . Smokeless tobacco: Never Used  . Alcohol use No     Comment: occassional       Family History :     Family History  Problem Relation Age of Onset  . GER disease Mother   . Coronary artery disease Brother   . Congenital heart disease Sister      Home Medications:   Prior to Admission medications     Medication Sig Start Date End Date Taking? Authorizing Provider  albuterol (PROVENTIL) (2.5 MG/3ML) 0.083% nebulizer solution Take 3 mLs (2.5 mg total) by nebulization every 6 (six) hours as needed for wheezing or shortness of breath. Patient will also need nebulizer machine please 05/24/16  Yes Nat Christen, MD  ALPRAZolam Duanne Moron) 0.5 MG tablet Take 1 tablet (0.5 mg total) by mouth at bedtime. 10/07/14  Yes Troy Sine, MD  amLODipine (NORVASC) 5 MG tablet TAKE ONE (1) TABLET BY MOUTH EVERY DAY 06/18/15  Yes Troy Sine, MD  aspirin 81 MG EC tablet Take 81 mg by mouth daily.     Yes Historical Provider, MD  budesonide-formoterol (SYMBICORT) 160-4.5 MCG/ACT inhaler Inhale 2 puffs into the lungs 2 (two) times daily. 05/12/16 05/26/16 Yes Noralee Space, MD  clopidogrel (PLAVIX) 75 MG tablet TAKE ONE (1) TABLET BY MOUTH EVERY DAY 11/27/15  Yes Troy Sine, MD  latanoprost (XALATAN) 0.005 % ophthalmic solution Place 1 drop into both eyes at bedtime.  01/10/13  Yes Historical Provider, MD  levofloxacin (LEVAQUIN) 500 MG tablet Take 1 tablet (500 mg total) by mouth daily. 05/24/16  Yes Nat Christen, MD  losartan (COZAAR) 50 MG tablet Take 1.5 tablets (75 mg total) by mouth daily. 05/18/16  Yes Troy Sine, MD  metoprolol succinate (TOPROL-XL) 50 MG 24 hr tablet Take 1 tablet (50 mg total) by mouth daily. 05/18/16  Yes Troy Sine, MD  montelukast (SINGULAIR) 10 MG tablet Take 1 tablet by mouth daily. 09/10/14  Yes Historical Provider, MD  Multiple Vitamins-Minerals (CENTRUM SILVER PO) Take 1 tablet by mouth daily.     Yes Historical Provider, MD  nitroGLYCERIN (NITROSTAT) 0.4 MG SL tablet Place 1 tablet (0.4 mg total) under the tongue every 5 (five) minutes as needed for chest pain. 01/29/13  Yes Troy Sine, MD  pantoprazole (PROTONIX) 40 MG tablet TAKE ONE (1) TABLET EACH DAY 10/15/15  Yes Troy Sine, MD  ranitidine (ZANTAC) 150 MG tablet Take 150 mg by mouth at bedtime.   Yes Historical  Provider, MD  rosuvastatin (CRESTOR) 10 MG tablet TAKE ONE (1) TABLET BY MOUTH EVERY DAY 01/09/16  Yes Troy Sine, MD  sucralfate (CARAFATE) 1 G tablet Take 1 tablet (1 g total) by mouth 4 (four) times daily -  with meals and at bedtime. 03/15/14  Yes Milton Ferguson, MD  timolol (TIMOPTIC) 0.5 % ophthalmic solution Place 1 drop into both eyes daily.  01/10/13  Yes Historical Provider, MD  tiotropium (SPIRIVA) 18 MCG inhalation capsule Place 1 capsule (18 mcg total) into inhaler and inhale daily. 03/23/16  Yes Noralee Space, MD     Allergies:     Allergies  Allergen Reactions  . Altace [Ramipril] Cough  . Prednisone Other (See Comments)    "throat broke out"      Physical Exam:   Vitals  Blood pressure 128/67, pulse 94, temperature 98.3 F (36.8 C), temperature source  Oral, resp. rate 18, height 5\' 8"  (1.727 m), weight 72.6 kg (160 lb), SpO2 95 %.   1. General middle aged whit male lying in bed in NAD  2. Normal affect and insight, Not Suicidal or Homicidal, Awake Alert, Oriented X 3.  3. No F.N deficits, ALL C.Nerves Intact, Strength 5/5 all 4 extremities, Sensation intact all 4 extremities, Plantars down going.  4. Ears and Eyes appear Normal, Conjunctivae clear, PERRLA. Moist Oral Mucosa.  5. Supple Neck, No JVD, No cervical lymphadenopathy appriciated, No Carotid Bruits.  6. Symmetrical Chest wall movement, Good air movement bilaterally, he does have some right middle lobe rales, minimal bilateral wheezes.   7. RRR, No Gallops, Rubs or Murmurs, No Parasternal Heave.  8. Positive Bowel Sounds, Abdomen Soft, No tenderness, No organomegaly appriciated,No rebound -guarding or rigidity.  9.  No Cyanosis, Normal Skin Turgor, No Skin Rash or Bruise.  10. Good muscle tone,  joints appear normal , no effusions, Normal ROM.  11. No Palpable Lymph Nodes in Neck or Axillae    Data Review:    CBC  Recent Labs Lab 05/24/16 1230 05/25/16 1119  WBC 26.4* 21.4*  HGB 15.4 13.2   HCT 46.0 39.3  PLT 250 222  MCV 93.5 92.0  MCH 31.3 30.9  MCHC 33.5 33.6  RDW 13.8 13.5  LYMPHSABS 1.3 1.2  MONOABS 1.6* 1.4*  EOSABS 0.0 0.0  BASOSABS 0.0 0.0   ------------------------------------------------------------------------------------------------------------------  Chemistries   Recent Labs Lab 05/24/16 1230 05/25/16 1119  NA 135 134*  K 4.0 3.8  CL 101 102  CO2 24 25  GLUCOSE 117* 114*  BUN 20 11  CREATININE 0.86 0.84  CALCIUM 8.9 8.6*  AST  --  14*  ALT  --  13*  ALKPHOS  --  63  BILITOT  --  0.9   ------------------------------------------------------------------------------------------------------------------ estimated creatinine clearance is 78 mL/min (by C-G formula based on SCr of 0.84 mg/dL). ------------------------------------------------------------------------------------------------------------------ No results for input(s): TSH, T4TOTAL, T3FREE, THYROIDAB in the last 72 hours.  Invalid input(s): FREET3  Coagulation profile No results for input(s): INR, PROTIME in the last 168 hours. ------------------------------------------------------------------------------------------------------------------- No results for input(s): DDIMER in the last 72 hours. -------------------------------------------------------------------------------------------------------------------  Cardiac Enzymes No results for input(s): CKMB, TROPONINI, MYOGLOBIN in the last 168 hours.  Invalid input(s): CK ------------------------------------------------------------------------------------------------------------------ No results found for: BNP   ---------------------------------------------------------------------------------------------------------------  Urinalysis    Component Value Date/Time   COLORURINE YELLOW 04/07/2014 [place] 04/07/2014 1235   LABSPEC <1.005 (L) 04/07/2014 1235   PHURINE 6.0 04/07/2014 1235   GLUCOSEU NEGATIVE  04/07/2014 1235   HGBUR NEGATIVE 04/07/2014 1235   BILIRUBINUR NEGATIVE 04/07/2014 1235   KETONESUR NEGATIVE 04/07/2014 1235   PROTEINUR NEGATIVE 04/07/2014 1235   UROBILINOGEN 0.2 04/07/2014 1235   NITRITE NEGATIVE 04/07/2014 1235   LEUKOCYTESUR NEGATIVE 04/07/2014 1235    ----------------------------------------------------------------------------------------------------------------   Imaging Results:    Dg Chest 2 View  Result Date: 05/24/2016 CLINICAL DATA:  Pt comes in with productive cough starting 3 days ago. Pt states he is coughing up yellow and green sputum. Pt has hx of COPD. Wheezing noted to RLL. NAD noted EXAM: CHEST  2 VIEW COMPARISON:  05/12/2016 FINDINGS: Hyperinflation. Prior median sternotomy. Moderate thoracic spondylosis. Midline trachea. Normal heart size. Atherosclerosis in the transverse aorta. No pleural effusion or pneumothorax. Diffuse peribronchial thickening. Right middle lobe airspace disease is new. Left base volume loss and scarring. IMPRESSION: Right middle lobe airspace disease, consistent with pneumonia. Underlying hyperinflation is consistent with  COPD. Electronically Signed   By: Abigail Miyamoto M.D.   On: 05/24/2016 10:31      Assessment & Plan:     1. Sepsis secondary to aspiration pneumonia -  he left the ER yesterday against medical advice. Patient had an elevated lactic acid of 4.1 yesterday, now within normal limits. Blood culture is positive 1/2 for positive cocci which most likely is a contaminant, however patient clinically had sepsis and does appear sick with pneumonia. He will be admitted. For now empiric IV vancomycin and Zosyn. IV fluids.Speech Eval. Check sputum Gram stain culture.  2. COPD. Compensated at this time. Continue albuterol nebulizer, Dulera and o2 PRN .    3. GERD. Patient was noted to have aspirated yesterday after having a reflux episode will start him on Reglan and Protonix.   4. DM type 2. Stable. Continue on  Diet control,  ISS, A1c & monitor.  5. CAD s/p CABG. Continue Plavix, blocker and statin for secondary prevention. No acute issues.  6. HTN. For now beta blocker and ARB, hold Norvasc. Monitor blood pressure and adjust.    DVT Prophylaxis Heparin   AM Labs Ordered, also please review Full Orders  Family Communication: Admission, patients condition and plan of care including tests being ordered have been discussed with the patient and family who indicate understanding and agree with the plan and Code Status.  Code Status FULL   Likely DC to  Home   Condition GUARDED    Consults called: None   Admission status: Inpatient   Time spent in minutes : 35 minutes    Kenyon Ana M.D on 05/25/2016 at 1:40 PM  Between 7am to 7pm - Pager - [phone]. After 7pm go to [email or web address] - password [email or web address]  [facility] - Office  [phone]  By signing my name below, I, Collene Leyden, attest that this documentation has been prepared under the direction and in the presence of Lala Lund , MD. Electronically signed: Collene Leyden, Scribe.

## 2016-05-25 NOTE — Evaluation (Signed)
Clinical/Bedside Swallow Evaluation Patient Details  Name: Gregory Eaton MRN: [id number] Date of Birth: 04-29-1946  Today's Date: 05/25/2016 Time: SLP Start Time (ACUTE ONLY): 1618 SLP Stop Time (ACUTE ONLY): 1630 SLP Time Calculation (min) (ACUTE ONLY): 12 min  Past Medical History:  Past Medical History:  Diagnosis Date  . Adenomatous colon polyp 03/2009   Last colonoscopy by Dr. [name]   . Adenomatous polyp 2010  . Adenomatous polyp of colon 11/03/2010  . Barrett's esophagus   . CAD (coronary artery disease)   . COPD (chronic obstructive pulmonary disease) (HCC)    Severe emphysema per CT  . Diverticulosis   . DM type 2 (diabetes mellitus, type 2) ([place]) 04/09/2014   A1c 6.5 (04/08/14)   . Esophageal carcinoma ([place]) 03/2009   T1N1M0  . GERD (gastroesophageal reflux disease)   . History of Doppler ultrasound 11/09/2011   03/2014- 50-69% L ICA stenosis;carotid doppler; L bulb/prox ICA 0-49% diameter reduction; L vertebral artery - occlusive ds; L ECA  demonstrates severe amount of fibrous plaque  . History of Doppler ultrasound 11/09/11   LEAs; R ABI - mod art. insuff.; L ABI normal at rest; R SFA - occlusive ds; L SFA - occlusive ds; patent fem-pop graft  . History of echocardiogram 08/27/2009   EF >55%  . History of nuclear stress test 11/24/2011   lexiscan; normal perfusion; low risk scan; non-diagnostic for ischemia  . Hyperlipidemia   . Hypertension   . Left carotid artery stenosis 04/08/2014  . Pulmonary nodule, right 04/08/2014   2.8 mm-incidental finding on CT  . PVD (peripheral vascular disease) ([place])   . Tachyarrhythmia 1999   Status post ablation at [facility]  . Tobacco abuse    Past Surgical History:  Past Surgical History:  Procedure Laterality Date  . COLONOSCOPY  03/17/2009   Dr.Rourk- normal rectum, sigmoid diverticula, some pale sigmoid mucosa with diffuse petechiae. pedunculated polyp at the splenic flexure, remainder of colonic mucosa appeared normal. bx= adenomatous  polyp  . COLONOSCOPY  04/19/2003   Dr.Rehman- few diverticu;a at the sigmoid colon, 3 small polyps, one at the transverse colon and 2 at the sigmoid, small external hemorrhoids. bx report not available.   . CORONARY ARTERY BYPASS GRAFT  1998   [name]  . ESOPHAGECTOMY  2010   [facility] Dr. [name]  . ESOPHAGOGASTRODUODENOSCOPY  11/25/2010   Dr.Rourk- s/p esophagectomy with gastric pull-up, esophageal erosions straddling the surgical anastomosis, salmon colored epithelium coming up a good centimeter to a centimeter and a half above the suture line, islands of salmon colored epithelium in the most poximal residual esophagus, remainder of gastric mucosa appeared normal. bx= swamous &gastric glandular mucosa w/chronic active inflammation  . ESOPHAGOGASTRODUODENOSCOPY  03/17/2009   Dr.Rourk- 4cm segment of salmon-colored epithlium distal esophagus suspicious for barretts esophagus. area of suspicious nodularity w/in this segment bx seperately. small to moderate size hiatal hernia, o/w normal stomach D1 and D2 bx=adenocarcinoma  . FEMORAL-POPLITEAL BYPASS GRAFT  1993   occlusive ds in R SFA  . GASTRIC PULL THROUGH  2010   With esophagectomy  . LIPOMA EXCISION  2010   HPI:  [name] a 70 y.o.male,with a history of insufficient cancer which is being treated, COPD, DM type 2, and CAD s/p CABG, who was admitted yesterday for possible aspiration in which he has severe ongoing reflux. He was found to have pneumonia in which he left AMA. He has returned to the ED After he was called by the ER with a positive blood  culture result which came from yesterday's visit. He is being admitted for further evaluation of sepsis secondary to pneumonia.    Assessment / Plan / Recommendation Clinical Impression  Pts swallow appearing within functional limits. Note hx of significant GERD as documented in MD notes and per pt report. Pt impulsive in rate of consumption of thin liquids with consecutive rapid sips  of thin liquids and delayed throat clear evidenced. Despite impulsivity, pt without overt signs or symptoms of aspiration with any PO. Last objective swallow evaluation, barium swallow 2009: with moderate sized hiatal hernia without evidence of obstruction and no evidence of aspiration. Pt may benefit from repeat barium swallow to rule out esophageal backflow with airway compromise if symptoms worsen. Recommend regular thin diet.    Aspiration Risk  Mild aspiration risk    Diet Recommendation   Regular, thin liquids  Medication Administration: Whole meds with liquid    Other  Recommendations Oral Care Recommendations: Oral care BID   Follow up Recommendations        Frequency and Duration min 1 x/week  1 week       Prognosis Prognosis for Safe Diet Advancement: Good      Swallow Study   General Date of Onset: 05/24/16 HPI: [name] a 70 y.o.male,with a history of insufficient cancer which is being treated, COPD, DM type 2, and CAD s/p CABG, who was admitted yesterday for possible aspiration in which he has severe ongoing reflux. He was found to have pneumonia in which he left AMA. He has returned to the ED After he was called by the ER with a positive blood culture result which came from yesterday's visit. He is being admitted for further evaluation of sepsis secondary to pneumonia.  Type of Study: Bedside Swallow Evaluation Previous Swallow Assessment: none on file Diet Prior to this Study: Dysphagia 3 (soft);Thin liquids Temperature Spikes Noted: No Respiratory Status: Nasal cannula History of Recent Intubation: No Behavior/Cognition: Alert;Cooperative Oral Cavity Assessment: Dry Oral Care Completed by SLP: Yes Oral Cavity - Dentition: Adequate natural dentition Vision: Functional for self-feeding Self-Feeding Abilities: Able to feed self Patient Positioning: Upright in bed Baseline Vocal Quality: Normal Volitional Cough: Congested;Strong Volitional Swallow: Able  to elicit    Oral/Motor/Sensory Function Overall Oral Motor/Sensory Function: Within functional limits   Ice Chips Ice chips: Not tested   Thin Liquid Thin Liquid: Impaired Pharyngeal  Phase Impairments: Multiple swallows;Cough - Delayed;Throat Clearing - Delayed    Nectar Thick Nectar Thick Liquid: Not tested   Honey Thick Honey Thick Liquid: Not tested   Puree Puree: Within functional limits   Solid   GO   Solid: Within functional limits       Arvil Chaco MA, [facility]    Levi Aland 05/25/2016,4:44 PM

## 2016-05-25 NOTE — ED Notes (Addendum)
Dr. Roderic Palau gave order to notify pt to come back to ED for admission due to positive blood culture. Pt was called and notified. Pt reports he will be on his way to the ED soon.

## 2016-05-25 NOTE — ED Notes (Signed)
CRITICAL VALUE ALERT  Critical value received: gram positive cocci in blood culture  Date of notification:  05/25/16  Time of notification:  0845  Critical value read back:Yes.    Nurse who received alert:  Norm Salt, RN  MD notified (1st page):  Dr. Roderic Palau  Time of first page:  0845  MD notified (2nd page):  Time of second page:  Responding MD:  Dr. Roderic Palau  Time MD responded:  [phone]

## 2016-05-25 NOTE — ED Notes (Signed)
Pt ambulated to BR O2 sat 85-88 %. O@ 2L/min initiated

## 2016-05-25 NOTE — ED Provider Notes (Signed)
[facility] DEPT Provider Note   CSN: [id number] Arrival date & time: 05/25/16  1022  By signing my name below, I, Rayna Sexton, attest that this documentation has been prepared under the direction and in the presence of Milton Ferguson, MD. Electronically Signed: Rayna Sexton, ED Scribe. 05/25/16. 11:05 AM.   History   Chief Complaint Chief Complaint  Patient presents with  . Pneumonia    HPI HPI Comments: KATELYN BRAXTON is a 70 y.o. male with a h/o COPD who presents to the Emergency Department for a f/u evaluation. Pt was seen yesterday with a productive cough and right sided CP and was dx with pneumonia-he was also admitted for pneumonia on 04/15/2016. He received 2L of IVF, IV rocephin and IV Zithromax. Hospital admission was discussed and pt preferred being treated as an outpatient. He was d/c with Levaquin 500 mg and an albuterol nebulizer. He states that since he has left his symptoms have persisted and he feels "so weak".   The history is provided by the patient. No language interpreter was used.  Pneumonia  This is a recurrent problem. The current episode started more than 2 days ago. The problem occurs constantly. The problem has not changed since onset.Pertinent negatives include no chest pain, no abdominal pain and no headaches. Treatments tried: levaquin and albuterol. The treatment provided no relief.    Past Medical History:  Diagnosis Date  . Adenomatous colon polyp 03/2009   Last colonoscopy by Dr. [name]   . Adenomatous polyp 2010  . Adenomatous polyp of colon 11/03/2010  . Barrett's esophagus   . CAD (coronary artery disease)   . COPD (chronic obstructive pulmonary disease) (HCC)    Severe emphysema per CT  . Diverticulosis   . DM type 2 (diabetes mellitus, type 2) ([place]) 04/09/2014   A1c 6.5 (04/08/14)   . Esophageal carcinoma ([place]) 03/2009   T1N1M0  . GERD (gastroesophageal reflux disease)   . History of Doppler ultrasound 11/09/2011   03/2014- 50-69% L ICA  stenosis;carotid doppler; L bulb/prox ICA 0-49% diameter reduction; L vertebral artery - occlusive ds; L ECA  demonstrates severe amount of fibrous plaque  . History of Doppler ultrasound 11/09/11   LEAs; R ABI - mod art. insuff.; L ABI normal at rest; R SFA - occlusive ds; L SFA - occlusive ds; patent fem-pop graft  . History of echocardiogram 08/27/2009   EF >55%  . History of nuclear stress test 11/24/2011   lexiscan; normal perfusion; low risk scan; non-diagnostic for ischemia  . Hyperlipidemia   . Hypertension   . Left carotid artery stenosis 04/08/2014  . Pulmonary nodule, right 04/08/2014   2.8 mm-incidental finding on CT  . PVD (peripheral vascular disease) ([place])   . Tachyarrhythmia 1999   Status post ablation at [facility]  . Tobacco abuse     Patient Active Problem List   Diagnosis Date Noted  . Cerebrovascular disease 05/12/2016  . COPD GOLD II  07/18/2015  . S/P CABG x 5 01/30/2015  . Hyperlipidemia LDL goal <70 10/09/2014  . DM type 2 (diabetes mellitus, type 2) ([place]) 04/09/2014  . Left carotid artery stenosis 04/08/2014  . Pulmonary nodule, right 04/08/2014  . CAP (community acquired pneumonia) 04/07/2014  . Acute dyspnea 04/07/2014  . Hypoglycemia 04/07/2014  . Near syncope 04/07/2014  . COPD with emphysema ([place]) 04/07/2014  . Adenomatous polyp of colon 11/03/2010  . Esophageal carcinoma ([place]) 03/19/2009  . BARRETTS ESOPHAGUS 03/11/2009  . CONSTIPATION 03/11/2009  . Essential hypertension 03/10/2009  .  RECTAL BLEEDING 03/10/2009  . LIPOMA 02/03/2009  . HYPERLIPIDEMIA 02/03/2009  . Coronary atherosclerosis 02/03/2009  . Peripheral vascular disease ([place]) 02/03/2009  . GERD 02/03/2009  . HEMATOCHEZIA 02/03/2009  . CAROTID BRUIT, RIGHT 02/03/2009    Past Surgical History:  Procedure Laterality Date  . COLONOSCOPY  03/17/2009   Dr.Rourk- normal rectum, sigmoid diverticula, some pale sigmoid mucosa with diffuse petechiae. pedunculated polyp at the splenic flexure,  remainder of colonic mucosa appeared normal. bx= adenomatous polyp  . COLONOSCOPY  04/19/2003   Dr.Rehman- few diverticu;a at the sigmoid colon, 3 small polyps, one at the transverse colon and 2 at the sigmoid, small external hemorrhoids. bx report not available.   . CORONARY ARTERY BYPASS GRAFT  1998   [name]  . ESOPHAGECTOMY  2010   [facility] Dr. [name]  . ESOPHAGOGASTRODUODENOSCOPY  11/25/2010   Dr.Rourk- s/p esophagectomy with gastric pull-up, esophageal erosions straddling the surgical anastomosis, salmon colored epithelium coming up a good centimeter to a centimeter and a half above the suture line, islands of salmon colored epithelium in the most poximal residual esophagus, remainder of gastric mucosa appeared normal. bx= swamous &gastric glandular mucosa w/chronic active inflammation  . ESOPHAGOGASTRODUODENOSCOPY  03/17/2009   Dr.Rourk- 4cm segment of salmon-colored epithlium distal esophagus suspicious for barretts esophagus. area of suspicious nodularity w/in this segment bx seperately. small to moderate size hiatal hernia, o/w normal stomach D1 and D2 bx=adenocarcinoma  . FEMORAL-POPLITEAL BYPASS GRAFT  1993   occlusive ds in R SFA  . GASTRIC PULL THROUGH  2010   With esophagectomy  . LIPOMA EXCISION  2010       Home Medications    Prior to Admission medications   Medication Sig Start Date End Date Taking? Authorizing Provider  albuterol (PROVENTIL) (2.5 MG/3ML) 0.083% nebulizer solution Take 3 mLs (2.5 mg total) by nebulization every 6 (six) hours as needed for wheezing or shortness of breath. Patient will also need nebulizer machine please 05/24/16   Nat Christen, MD  ALPRAZolam Duanne Moron) 0.5 MG tablet Take 1 tablet (0.5 mg total) by mouth at bedtime. 10/07/14   Troy Sine, MD  amLODipine (NORVASC) 5 MG tablet TAKE ONE (1) TABLET BY MOUTH EVERY DAY 06/18/15   Troy Sine, MD  aspirin 81 MG EC tablet Take 81 mg by mouth daily.      Historical Provider, MD    budesonide-formoterol (SYMBICORT) 160-4.5 MCG/ACT inhaler Inhale 2 puffs into the lungs 2 (two) times daily. 05/12/16 05/26/16  Noralee Space, MD  clopidogrel (PLAVIX) 75 MG tablet TAKE ONE (1) TABLET BY MOUTH EVERY DAY 11/27/15   Troy Sine, MD  latanoprost (XALATAN) 0.005 % ophthalmic solution Place 1 drop into both eyes at bedtime.  01/10/13   Historical Provider, MD  levofloxacin (LEVAQUIN) 500 MG tablet Take 1 tablet (500 mg total) by mouth daily. 05/24/16   Nat Christen, MD  losartan (COZAAR) 50 MG tablet Take 1.5 tablets (75 mg total) by mouth daily. 05/18/16   Troy Sine, MD  metoprolol succinate (TOPROL-XL) 50 MG 24 hr tablet Take 1 tablet (50 mg total) by mouth daily. 05/18/16   Troy Sine, MD  montelukast (SINGULAIR) 10 MG tablet Take 1 tablet by mouth daily. 09/10/14   Historical Provider, MD  Multiple Vitamins-Minerals (CENTRUM SILVER PO) Take 1 tablet by mouth daily.      Historical Provider, MD  nitroGLYCERIN (NITROSTAT) 0.4 MG SL tablet Place 1 tablet (0.4 mg total) under the tongue every 5 (five) minutes as needed  for chest pain. 01/29/13   Troy Sine, MD  pantoprazole (PROTONIX) 40 MG tablet TAKE ONE (1) TABLET EACH DAY 10/15/15   Troy Sine, MD  ranitidine (ZANTAC) 150 MG tablet Take 150 mg by mouth at bedtime.    Historical Provider, MD  rosuvastatin (CRESTOR) 10 MG tablet TAKE ONE (1) TABLET BY MOUTH EVERY DAY 01/09/16   Troy Sine, MD  sucralfate (CARAFATE) 1 G tablet Take 1 tablet (1 g total) by mouth 4 (four) times daily -  with meals and at bedtime. 03/15/14   Milton Ferguson, MD  timolol (TIMOPTIC) 0.5 % ophthalmic solution Place 1 drop into both eyes daily.  01/10/13   Historical Provider, MD  tiotropium (SPIRIVA) 18 MCG inhalation capsule Place 1 capsule (18 mcg total) into inhaler and inhale daily. 03/23/16   Noralee Space, MD    Family History Family History  Problem Relation Age of Onset  . GER disease Mother   . Coronary artery disease Brother   .  Congenital heart disease Sister     Social History Social History  Substance Use Topics  . Smoking status: Former Smoker    Packs/day: 2.00    Years: 40.00    Types: Cigarettes    Quit date: 07/20/1995  . Smokeless tobacco: Never Used  . Alcohol use No     Comment: occassional     Allergies   Altace [ramipril] and Prednisone   Review of Systems Review of Systems  Constitutional: Positive for fatigue. Negative for appetite change.  HENT: Negative for congestion, ear discharge and sinus pressure.   Eyes: Negative for discharge.  Respiratory: Positive for cough.   Cardiovascular: Negative for chest pain.  Gastrointestinal: Negative for abdominal pain and diarrhea.  Genitourinary: Negative for frequency and hematuria.  Musculoskeletal: Negative for back pain.  Skin: Negative for rash.  Neurological: Positive for weakness. Negative for seizures and headaches.  Psychiatric/Behavioral: Negative for hallucinations.   Physical Exam Updated Vital Signs BP 92/75 (BP Location: Left Arm)   Pulse 103   Temp 98.3 F (36.8 C) (Oral)   Resp 18   Ht 5\' 8"  (1.727 m)   Wt 160 lb (72.6 kg)   SpO2 94%   BMI 24.33 kg/m   Physical Exam  Constitutional: He is oriented to person, place, and time. He appears well-developed.  HENT:  Head: Normocephalic.  Eyes: Conjunctivae and EOM are normal. No scleral icterus.  Neck: Neck supple. No thyromegaly present.  Cardiovascular: Normal rate and regular rhythm.  Exam reveals no gallop and no friction rub.   No murmur heard. Pulmonary/Chest: No stridor. He has wheezes. He has no rales. He exhibits no tenderness.  Mild wheezing noted bilaterally  Abdominal: He exhibits no distension. There is no tenderness. There is no rebound.  Musculoskeletal: Normal range of motion. He exhibits no edema.  Lymphadenopathy:    He has no cervical adenopathy.  Neurological: He is oriented to person, place, and time. He exhibits normal muscle tone. Coordination  normal.  Skin: No rash noted. No erythema.  Psychiatric: He has a normal mood and affect. His behavior is normal.   ED Treatments / Results  Labs (all labs ordered are listed, but only abnormal results are displayed) Labs Reviewed - No data to display  EKG  EKG Interpretation None       Radiology Dg Chest 2 View  Result Date: 05/24/2016 CLINICAL DATA:  Pt comes in with productive cough starting 3 days ago. Pt states he is coughing  up yellow and green sputum. Pt has hx of COPD. Wheezing noted to RLL. NAD noted EXAM: CHEST  2 VIEW COMPARISON:  05/12/2016 FINDINGS: Hyperinflation. Prior median sternotomy. Moderate thoracic spondylosis. Midline trachea. Normal heart size. Atherosclerosis in the transverse aorta. No pleural effusion or pneumothorax. Diffuse peribronchial thickening. Right middle lobe airspace disease is new. Left base volume loss and scarring. IMPRESSION: Right middle lobe airspace disease, consistent with pneumonia. Underlying hyperinflation is consistent with COPD. Electronically Signed   By: Abigail Miyamoto M.D.   On: 05/24/2016 10:31    Procedures Procedures  DIAGNOSTIC STUDIES: Oxygen Saturation is 90% on RA, low by my interpretation.    COORDINATION OF CARE: 11:03 AM Discussed next steps with pt including hospital admission and IV abx. Pt verbalized understanding and is agreeable with the plan.    Medications Ordered in ED Medications - No data to display   Initial Impression / Assessment and Plan / ED Course  I have reviewed the triage vital signs and the nursing notes.  Pertinent labs & imaging results that were available during my care of the patient were reviewed by me and considered in my medical decision making (see chart for details).  Clinical Course     Patient with pneumonia. He also has a positive blood culture from yesterday. Patient returned to the hospital to be admitted for IV antibiotics.  The chart was scribed for me under my direct  supervision.  I personally performed the history, physical, and medical decision making and all procedures in the evaluation of this patient..  Final Clinical Impressions(s) / ED Diagnoses   Final diagnoses:  None    New Prescriptions New Prescriptions   No medications on file     Milton Ferguson, MD 05/25/16 1344

## 2016-05-26 ENCOUNTER — Inpatient Hospital Stay (HOSPITAL_COMMUNITY): Payer: Medicare Other

## 2016-05-26 DIAGNOSIS — J449 Chronic obstructive pulmonary disease, unspecified: Secondary | ICD-10-CM

## 2016-05-26 DIAGNOSIS — J189 Pneumonia, unspecified organism: Secondary | ICD-10-CM

## 2016-05-26 DIAGNOSIS — I10 Essential (primary) hypertension: Secondary | ICD-10-CM

## 2016-05-26 LAB — CBC
HEMATOCRIT: 38 % — AB (ref 39.0–52.0)
Hemoglobin: 12.7 g/dL — ABNORMAL LOW (ref 13.0–17.0)
MCH: 30.5 pg (ref 26.0–34.0)
MCHC: 33.4 g/dL (ref 30.0–36.0)
MCV: 91.3 fL (ref 78.0–100.0)
PLATELETS: 244 10*3/uL (ref 150–400)
RBC: 4.16 MIL/uL — AB (ref 4.22–5.81)
RDW: 13.3 % (ref 11.5–15.5)
WBC: 18.9 10*3/uL — AB (ref 4.0–10.5)

## 2016-05-26 LAB — BASIC METABOLIC PANEL
ANION GAP: 8 (ref 5–15)
BUN: 9 mg/dL (ref 6–20)
CO2: 23 mmol/L (ref 22–32)
Calcium: 8.2 mg/dL — ABNORMAL LOW (ref 8.9–10.3)
Chloride: 103 mmol/L (ref 101–111)
Creatinine, Ser: 0.72 mg/dL (ref 0.61–1.24)
GFR calc Af Amer: >60 mL/min (ref >60)
GLUCOSE: 112 mg/dL — AB (ref 65–99)
POTASSIUM: 3.5 mmol/L (ref 3.5–5.1)
Sodium: 134 mmol/L — ABNORMAL LOW (ref 135–145)

## 2016-05-26 LAB — GLUCOSE, CAPILLARY
GLUCOSE-CAPILLARY: 133 mg/dL — AB (ref 65–99)
Glucose-Capillary: 116 mg/dL — ABNORMAL HIGH (ref 65–99)
Glucose-Capillary: 124 mg/dL — ABNORMAL HIGH (ref 65–99)
Glucose-Capillary: 135 mg/dL — ABNORMAL HIGH (ref 65–99)

## 2016-05-26 LAB — HEMOGLOBIN A1C
Hgb A1c MFr Bld: 6.3 % — ABNORMAL HIGH (ref 4.8–5.6)
Mean Plasma Glucose: 134 mg/dL

## 2016-05-26 MED ORDER — ACETAMINOPHEN 325 MG PO TABS
650.0000 mg | ORAL_TABLET | Freq: Four times a day (QID) | ORAL | Status: DC | PRN
  Administered 2016-05-26 – 2016-06-02 (×6): 650 mg via ORAL
  Filled 2016-05-26 (×6): qty 2

## 2016-05-26 NOTE — Progress Notes (Addendum)
PROGRESS NOTE    Gregory Eaton  [id number] DOB: October 27, 1945 DOA: 05/25/2016 PCP: Purvis Kilts, MD    Brief Narrative:  70 year old male history of esophageal cancer, COPD, type 2 diabetes, CAD status post CABG who initially presented with aspiration pneumonia. Patient left AGAINST MEDICAL ADVICE. Patient later returned emergency department after being found to have blood cultures that were positive. Patient was subsequently admitted for further workup.  Assessment & Plan:   Principal Problem:   Sepsis due to pneumonia [facility]) Active Problems:   Essential hypertension   CAP (community acquired pneumonia)   DM type 2 (diabetes mellitus, type 2) ([place])   S/P CABG x 5   COPD GOLD II    Sepsis ([place])  1. Sepsis secondary to aspiration pneumonia -Patient was initially continued on vancomycin and Zosyn -Appreciate input by speech pathology, recommendations noted -Patient noted to have 1 out of 2 coag negative staph species-likely contaminant. We'll discontinue vancomycin as a result -Presenting lactate of over 4 noted. Lactic acid has since improved -Patient remains O2 dependent on 4 L nasal cannula at present. -We will order flutter valve, continue Zosyn monotherapy, wean O2 as tolerated -Repeat CBC in the morning  2. COPD. -Remains clinically compensated at this time -Patient is continued on bronchodilators as needed -Continue O2, wean as tolerated  3. GERD. -A she is continued on Reglan with Protonix -Seems stable at this time   4. DM type 2.  -Stable at present  -Continue diabetic diet   5. CAD s/p CABG.  -For now, continue Plavix, beta blocker, statin as tolerated  6. HTN.  -Blood pressure currently stable  -Continue beta blocker, ARB.  -Norvasc presently on hold   DVT prophylaxis: Heparin Code Status: Full code Family Communication: She room, family at bedside Disposition Plan: Uncertain at this time, anticipate discharge when able to wean off  O2  Consultants:     Procedures:     Antimicrobials: Anti-infectives    Start     Dose/Rate Route Frequency Ordered Stop   05/25/16 2200  vancomycin (VANCOCIN) IVPB 1000 mg/200 mL premix  Status:  Discontinued     1,000 mg 200 mL/hr over 60 Minutes Intravenous Every 12 hours 05/25/16 1543 05/26/16 1705   05/25/16 1700  piperacillin-tazobactam (ZOSYN) IVPB 3.375 g     3.375 g 12.5 mL/hr over 240 Minutes Intravenous Every 8 hours 05/25/16 1543     05/25/16 1115  piperacillin-tazobactam (ZOSYN) IVPB 3.375 g     3.375 g 100 mL/hr over 30 Minutes Intravenous  Once 05/25/16 1105 05/25/16 1150   05/25/16 1115  vancomycin (VANCOCIN) IVPB 1000 mg/200 mL premix     1,000 mg 200 mL/hr over 60 Minutes Intravenous  Once 05/25/16 1105 05/25/16 1308      Subjective: No complaints at present. Patient reports feeling well  Objective: Vitals:   05/26/16 0818 05/26/16 0832 05/26/16 1424 05/26/16 1501  BP:    (!) 116/51  Pulse:    92  Resp:    20  Temp:    99.2 F (37.3 C)  TempSrc:    Oral  SpO2: 91% 90% (!) 84% 92%  Weight:      Height:        Intake/Output Summary (Last 24 hours) at 05/26/16 1713 Last data filed at 05/26/16 1509  Gross per 24 hour  Intake          1573.75 ml  Output  0 ml  Net          1573.75 ml   Filed Weights   05/25/16 1045 05/25/16 1601  Weight: 72.6 kg (160 lb) 71.7 kg (158 lb)    Examination:  General exam: Appears calm and comfortable  Respiratory system: Clear to auscultation. Respiratory effort normal. Cardiovascular system: S1 & S2 heard, RRR. Gastrointestinal system: Abdomen is nondistended, soft and nontender. No organomegaly or masses felt. Normal bowel sounds heard. Central nervous system: Alert and oriented. No focal neurological deficits. Extremities: Symmetric 5 x 5 power. Skin: No rashes, lesions Psychiatry: Judgement and insight appear normal. Mood & affect appropriate.   Data Reviewed: I have personally reviewed  following labs and imaging studies  CBC:  Recent Labs Lab 05/24/16 1230 05/25/16 1119 05/26/16 0631  WBC 26.4* 21.4* 18.9*  NEUTROABS 23.5* 18.8*  --   HGB 15.4 13.2 12.7*  HCT 46.0 39.3 38.0*  MCV 93.5 92.0 91.3  PLT 250 222 [id number]   Basic Metabolic Panel:  Recent Labs Lab 05/24/16 1230 05/25/16 1119 05/26/16 0631  NA 135 134* 134*  K 4.0 3.8 3.5  CL 101 102 103  CO2 24 25 23   GLUCOSE 117* 114* 112*  BUN 20 11 9   CREATININE 0.86 0.84 0.72  CALCIUM 8.9 8.6* 8.2*   GFR: Estimated Creatinine Clearance: 81.9 mL/min (by C-G formula based on SCr of 0.72 mg/dL). Liver Function Tests:  Recent Labs Lab 05/25/16 1119  AST 14*  ALT 13*  ALKPHOS 63  BILITOT 0.9  PROT 6.8  ALBUMIN 3.1*   No results for input(s): LIPASE, AMYLASE in the last 168 hours. No results for input(s): AMMONIA in the last 168 hours. Coagulation Profile: No results for input(s): INR, PROTIME in the last 168 hours. Cardiac Enzymes: No results for input(s): CKTOTAL, CKMB, CKMBINDEX, TROPONINI in the last 168 hours. BNP (last 3 results) No results for input(s): PROBNP in the last 8760 hours. HbA1C:  Recent Labs  05/25/16 1119  HGBA1C 6.3*   CBG:  Recent Labs Lab 05/25/16 1655 05/25/16 2158 05/26/16 0816 05/26/16 1135 05/26/16 1638  GLUCAP 96 123* 116* 133* 124*   Lipid Profile: No results for input(s): CHOL, HDL, LDLCALC, TRIG, CHOLHDL, LDLDIRECT in the last 72 hours. Thyroid Function Tests: No results for input(s): TSH, T4TOTAL, FREET4, T3FREE, THYROIDAB in the last 72 hours. Anemia Panel: No results for input(s): VITAMINB12, FOLATE, FERRITIN, TIBC, IRON, RETICCTPCT in the last 72 hours. Sepsis Labs:  Recent Labs Lab 05/24/16 1231 05/24/16 1534 05/25/16 1129 05/25/16 1434  LATICACIDVEN 4.1* 2.9* 1.80 0.99    Recent Results (from the past 240 hour(s))  Culture, blood (Routine X 2) w Reflex to ID Panel     Status: Abnormal (Preliminary result)   Collection Time: 05/24/16  12:30 PM  Result Value Ref Range Status   Specimen Description BLOOD BLOOD LEFT ARM  Final   Special Requests BOTTLES DRAWN AEROBIC AND ANAEROBIC [facility] EACH  Final   Culture  Setup Time   Final    Anaerobic bottle recovered Gram Positive Cocci Gram Stain Report Called to,Read Back By and Verified With: White,M at 845am by H Flynt  05/25/16 CRITICAL RESULT CALLED TO, READ BACK BY AND VERIFIED WITH: C. KNIGHT, RN (APH) AT [place] ON 05/25/16 BY C. JESSUP, MLT. Performed at [facility]    Culture STAPHYLOCOCCUS SPECIES (COAGULASE NEGATIVE) (A)  Final   Report Status PENDING  Incomplete  Culture, blood (Routine X 2) w Reflex to ID Panel  Status: None (Preliminary result)   Collection Time: 05/24/16 12:30 PM  Result Value Ref Range Status   Specimen Description BLOOD RIGHT ANTECUBITAL  Final   Special Requests BOTTLES DRAWN AEROBIC AND ANAEROBIC [place]  Final   Culture NO GROWTH 2 DAYS  Final   Report Status PENDING  Incomplete  Blood Culture ID Panel (Reflexed)     Status: Abnormal   Collection Time: 05/24/16 12:30 PM  Result Value Ref Range Status   Enterococcus species NOT DETECTED NOT DETECTED Final   Listeria monocytogenes NOT DETECTED NOT DETECTED Final   Staphylococcus species DETECTED (A) NOT DETECTED Final    Comment: CRITICAL RESULT CALLED TO, READ BACK BY AND VERIFIED WITH: C. KNIGHT, RN (APH) AT [place] ON 05/25/16 BY C. JESSUP, MLT.    Staphylococcus aureus NOT DETECTED NOT DETECTED Final   Methicillin resistance NOT DETECTED NOT DETECTED Final   Streptococcus species NOT DETECTED NOT DETECTED Final   Streptococcus agalactiae NOT DETECTED NOT DETECTED Final   Streptococcus pneumoniae NOT DETECTED NOT DETECTED Final   Streptococcus pyogenes NOT DETECTED NOT DETECTED Final   Acinetobacter baumannii NOT DETECTED NOT DETECTED Final   Enterobacteriaceae species NOT DETECTED NOT DETECTED Final   Enterobacter cloacae complex NOT DETECTED NOT DETECTED Final   Escherichia coli  NOT DETECTED NOT DETECTED Final   Klebsiella oxytoca NOT DETECTED NOT DETECTED Final   Klebsiella pneumoniae NOT DETECTED NOT DETECTED Final   Proteus species NOT DETECTED NOT DETECTED Final   Serratia marcescens NOT DETECTED NOT DETECTED Final   Haemophilus influenzae NOT DETECTED NOT DETECTED Final   Neisseria meningitidis NOT DETECTED NOT DETECTED Final   Pseudomonas aeruginosa NOT DETECTED NOT DETECTED Final   Candida albicans NOT DETECTED NOT DETECTED Final   Candida glabrata NOT DETECTED NOT DETECTED Final   Candida krusei NOT DETECTED NOT DETECTED Final   Candida parapsilosis NOT DETECTED NOT DETECTED Final   Candida tropicalis NOT DETECTED NOT DETECTED Final    Comment: Performed at [place]     Radiology Studies: Dg Swallowing Func-speech Pathology  Result Date: 05/26/2016 Objective Swallowing Evaluation: Type of Study: MBS-Modified Barium Swallow Study Patient Details Name: TANISH DURU MRN: [id number] Date of Birth: 04-25-1946  Today's Date: 05/26/2016 Time: SLP Start Time (ACUTE ONLY): 1022-SLP Stop Time (ACUTE ONLY): 1115 SLP Time Calculation (min) (ACUTE ONLY): 53 min  Past Medical History:    Past Medical History: Diagnosis Date . Adenomatous colon polyp 03/2009  Last colonoscopy by Dr. [name]  . Adenomatous polyp 2010 . Adenomatous polyp of colon 11/03/2010 . Barrett's esophagus  . CAD (coronary artery disease)  . COPD (chronic obstructive pulmonary disease) ([facility])   Severe emphysema per CT . Diverticulosis  . DM type 2 (diabetes mellitus, type 2) ([place]) 04/09/2014  A1c 6.5 (04/08/14)  . Esophageal carcinoma ([place]) 03/2009  T1N1M0 . GERD (gastroesophageal reflux disease)  . History of Doppler ultrasound 11/09/2011  03/2014- 50-69% L ICA stenosis;carotid doppler; L bulb/prox ICA 0-49% diameter reduction; L vertebral artery - occlusive ds; L ECA  demonstrates severe amount of fibrous plaque . History of Doppler ultrasound 11/09/11  LEAs; R ABI - mod art. insuff.; L ABI  normal at rest; R SFA - occlusive ds; L SFA - occlusive ds; patent fem-pop graft . History of echocardiogram 08/27/2009  EF >55% . History of nuclear stress test 11/24/2011  lexiscan; normal perfusion; low risk scan; non-diagnostic for ischemia . Hyperlipidemia  . Hypertension  . Left carotid artery stenosis 04/08/2014 . Pulmonary nodule, right 04/08/2014  2.8 mm-incidental finding on CT . PVD (peripheral vascular disease) ([place])  . Tachyarrhythmia 1999  Status post ablation at [facility] . Tobacco abuse   Past Surgical History:     Past Surgical History: Procedure Laterality Date . COLONOSCOPY  03/17/2009  Dr.Rourk- normal rectum, sigmoid diverticula, some pale sigmoid mucosa with diffuse petechiae. pedunculated polyp at the splenic flexure, remainder of colonic mucosa appeared normal. bx= adenomatous polyp . COLONOSCOPY  04/19/2003  Dr.Rehman- few diverticu;a at the sigmoid colon, 3 small polyps, one at the transverse colon and 2 at the sigmoid, small external hemorrhoids. bx report not available.  . CORONARY ARTERY BYPASS GRAFT  1998  [name] . ESOPHAGECTOMY  2010  [facility] Dr. [name] . ESOPHAGOGASTRODUODENOSCOPY  11/25/2010  Dr.Rourk- s/p esophagectomy with gastric pull-up, esophageal erosions straddling the surgical anastomosis, salmon colored epithelium coming up a good centimeter to a centimeter and a half above the suture line, islands of salmon colored epithelium in the most poximal residual esophagus, remainder of gastric mucosa appeared normal. bx= swamous &gastric glandular mucosa w/chronic active inflammation . ESOPHAGOGASTRODUODENOSCOPY  03/17/2009  Dr.Rourk- 4cm segment of salmon-colored epithlium distal esophagus suspicious for barretts esophagus. area of suspicious nodularity w/in this segment bx seperately. small to moderate size hiatal hernia, o/w normal stomach D1 and D2 bx=adenocarcinoma . FEMORAL-POPLITEAL BYPASS GRAFT  1993  occlusive ds in R SFA . GASTRIC PULL THROUGH  2010   With esophagectomy . LIPOMA EXCISION  2010  HPI: [name] a 70 y.o.male,with a history of insufficient cancer which is being treated, COPD, DM type 2, and CAD s/p CABG, who was admitted yesterday for possible aspiration in which he has severe ongoing reflux. He was found to have pneumonia in which he left AMA. He has returned to the ED After he was called by the ER with a positive blood culture result which came from yesterday's visit. He is being admitted for further evaluation of sepsis secondary to pneumonia.  Subjective: "Sometimes I have food and phlegm come up at night."   Assessment / Plan / Recommendation  CHL IP CLINICAL IMPRESSIONS 05/26/2016 Therapy Diagnosis Suspected primary esophageal dysphagia Clinical Impression Pt seen for MBSS in lateral position in [place] chair and assessed with thin cup and straw, puree, regular textures, and barium tablet with thin. Oropharyngeal swallow is essentially WNL with only mild pyriform pooling with thin liquids after the primary swallow from residuals in valleculae/base of tongue spilling to pyriforms. Pt was generally not aware of pooling and did not always spontaneously clear. When cued to initiate a "dry swallow", pyriform residuals cleared. Several esophageal sweeps were completed due to suspicion of decreased motility. Incomplete clearance of puree and regular textures noted and stasis in what would be his thoracic esophagus (pt s/p esophagectomy with gastric pull up) which did not clear after one minute. Pt was then given liquid wash, which did clear the majority of bolus, but not all. Suspect that over the course of an entire meal, residuals would increase without liquid wash. Pt and his wife state that pt typically has very little fluid intake throughout the day. Recommend self regulated regular textures (pt advised by his surgeon to follow reflux diet and avoid dry,crunchy foods) and follow solids with liquids periodically to facilitate  clearance to lower portion of stomach. Importance of reflux precautions were reinforced to pt and his wife. Pt was shown on video, stasis in mid chest. Please see additional images on PACS hyperlink tab at the top of this report. No further SLP services  indicated at this time. Pt to follow up with Dr. [name] in a few weeks per wife. Above to Dr. Wyline Copas. Impact on safety and function Mild aspiration risk    CHL IP TREATMENT RECOMMENDATION 05/26/2016 Treatment Recommendations No treatment recommended at this time   Prognosis 05/25/2016 Prognosis for Safe Diet Advancement Good Barriers to Reach Goals -- Barriers/Prognosis Comment --   CHL IP DIET RECOMMENDATION 05/26/2016 SLP Diet Recommendations Regular solids;Thin liquid Liquid Administration via Cup Medication Administration Whole meds with liquid Compensations Follow solids with liquid Postural Changes Remain semi-upright after after feeds/meals (Comment);Seated upright at 90 degrees    CHL IP OTHER RECOMMENDATIONS 05/26/2016 Recommended Consults Consider esophageal assessment Oral Care Recommendations Oral care BID Other Recommendations Clarify dietary restrictions    CHL IP FOLLOW UP RECOMMENDATIONS 05/26/2016 Follow up Recommendations None    CHL IP FREQUENCY AND DURATION 05/25/2016 Speech Therapy Frequency (ACUTE ONLY) min 1 x/week Treatment Duration 1 week       CHL IP ORAL PHASE 05/26/2016 Oral Phase WFL Oral - Pudding Teaspoon -- Oral - Pudding Cup -- Oral - Honey Teaspoon -- Oral - Honey Cup -- Oral - Nectar Teaspoon -- Oral - Nectar Cup -- Oral - Nectar Straw -- Oral - Thin Teaspoon -- Oral - Thin Cup -- Oral - Thin Straw -- Oral - Puree -- Oral - Mech Soft -- Oral - Regular -- Oral - Multi-Consistency -- Oral - Pill -- Oral Phase - Comment --  CHL IP PHARYNGEAL PHASE 05/26/2016 Pharyngeal Phase Impaired Pharyngeal- Pudding Teaspoon -- Pharyngeal -- Pharyngeal- Pudding Cup -- Pharyngeal -- Pharyngeal- Honey Teaspoon -- Pharyngeal -- Pharyngeal-  Honey Cup -- Pharyngeal -- Pharyngeal- Nectar Teaspoon -- Pharyngeal -- Pharyngeal- Nectar Cup -- Pharyngeal -- Pharyngeal- Nectar Straw -- Pharyngeal -- Pharyngeal- Thin Teaspoon -- Pharyngeal -- Pharyngeal- Thin Cup Pharyngeal residue - pyriform Pharyngeal -- Pharyngeal- Thin Straw Pharyngeal residue - pyriform;Lateral channel residue Pharyngeal -- Pharyngeal- Puree Pharyngeal residue - valleculae;Pharyngeal residue - posterior pharnyx Pharyngeal -- Pharyngeal- Mechanical Soft -- Pharyngeal -- Pharyngeal- Regular WFL Pharyngeal -- Pharyngeal- Multi-consistency -- Pharyngeal -- Pharyngeal- Pill WFL Pharyngeal -- Pharyngeal Comment min residuals thin in pyriforms post swallow from vallecular residue spilling to pyriforms with decreased awareness- cued swallow clears   CHL IP CERVICAL ESOPHAGEAL PHASE 05/26/2016 Cervical Esophageal Phase Impaired Pudding Teaspoon -- Pudding Cup -- Honey Teaspoon -- Honey Cup -- Nectar Teaspoon -- Nectar Cup -- Nectar Straw -- Thin Teaspoon -- Thin Cup -- Thin Straw -- Puree Other (Comment) Mechanical Soft -- Regular -- Multi-consistency -- Pill -- Cervical Esophageal Comment --   Thank you,  Genene Churn, CCC-SLP [phone]  [place] 05/26/2016, 3:23 PM      Scheduled Meds: . albuterol  2.5 mg Nebulization Q6H  . ALPRAZolam  0.5 mg Oral QHS  . aspirin EC  81 mg Oral Daily  . clopidogrel  75 mg Oral Daily  . heparin  5,000 Units Subcutaneous Q8H  . insulin aspart  0-9 Units Subcutaneous TID WC  . latanoprost  1 drop Both Eyes QHS  . losartan  25 mg Oral Daily  . metoCLOPramide  5 mg Oral TID AC  . metoprolol succinate  50 mg Oral Daily  . mometasone-formoterol  2 puff Inhalation BID  . montelukast  10 mg Oral Daily  . pantoprazole  40 mg Oral BID  . piperacillin-tazobactam (ZOSYN)  IV  3.375 g Intravenous Q8H  . polyethylene glycol  17 g Oral BID  . rosuvastatin  10 mg Oral Daily  .  sucralfate  1 g Oral TID WC & HS  . timolol  1 drop Both Eyes Daily    . tiotropium  18 mcg Inhalation Daily   Continuous Infusions:   LOS: 1 day   CHIU, Orpah Melter, MD [facility] Pager [phone]  If 7PM-7AM, please contact night-coverage [email or web address] Password [email or web address] 05/26/2016, 5:13 PM

## 2016-05-26 NOTE — Procedures (Signed)
Objective Swallowing Evaluation: Type of Study: MBS-Modified Barium Swallow Study  Patient Details  Name: Gregory Eaton MRN: [id number] Date of Birth: [birth year]  Today's Date: 05/26/2016 Time: SLP Start Time (ACUTE ONLY): 1022-SLP Stop Time (ACUTE ONLY): 1115 SLP Time Calculation (min) (ACUTE ONLY): 53 min  Past Medical History:  Past Medical History:  Diagnosis Date  . Adenomatous colon polyp 03/2009   Last colonoscopy by Dr. [name]   . Adenomatous polyp 2010  . Adenomatous polyp of colon 11/03/2010  . Barrett's esophagus   . CAD (coronary artery disease)   . COPD (chronic obstructive pulmonary disease) (HCC)    Severe emphysema per CT  . Diverticulosis   . DM type 2 (diabetes mellitus, type 2) ([place]) 04/09/2014   A1c 6.5 (04/08/14)   . Esophageal carcinoma ([place]) 03/2009   T1N1M0  . GERD (gastroesophageal reflux disease)   . History of Doppler ultrasound 11/09/2011   03/2014- 50-69% L ICA stenosis;carotid doppler; L bulb/prox ICA 0-49% diameter reduction; L vertebral artery - occlusive ds; L ECA  demonstrates severe amount of fibrous plaque  . History of Doppler ultrasound 11/09/11   LEAs; R ABI - mod art. insuff.; L ABI normal at rest; R SFA - occlusive ds; L SFA - occlusive ds; patent fem-pop graft  . History of echocardiogram 08/27/2009   EF >55%  . History of nuclear stress test 11/24/2011   lexiscan; normal perfusion; low risk scan; non-diagnostic for ischemia  . Hyperlipidemia   . Hypertension   . Left carotid artery stenosis 04/08/2014  . Pulmonary nodule, right 04/08/2014   2.8 mm-incidental finding on CT  . PVD (peripheral vascular disease) ([place])   . Tachyarrhythmia 1999   Status post ablation at [facility]  . Tobacco abuse    Past Surgical History:  Past Surgical History:  Procedure Laterality Date  . COLONOSCOPY  03/17/2009   Dr.Rourk- normal rectum, sigmoid diverticula, some pale sigmoid mucosa with diffuse petechiae. pedunculated polyp at the splenic flexure, remainder of  colonic mucosa appeared normal. bx= adenomatous polyp  . COLONOSCOPY  04/19/2003   Dr.Rehman- few diverticu;a at the sigmoid colon, 3 small polyps, one at the transverse colon and 2 at the sigmoid, small external hemorrhoids. bx report not available.   . CORONARY ARTERY BYPASS GRAFT  1998   [name]  . ESOPHAGECTOMY  2010   [facility] Dr. [name]  . ESOPHAGOGASTRODUODENOSCOPY  11/25/2010   Dr.Rourk- s/p esophagectomy with gastric pull-up, esophageal erosions straddling the surgical anastomosis, salmon colored epithelium coming up a good centimeter to a centimeter and a half above the suture line, islands of salmon colored epithelium in the most poximal residual esophagus, remainder of gastric mucosa appeared normal. bx= swamous &gastric glandular mucosa w/chronic active inflammation  . ESOPHAGOGASTRODUODENOSCOPY  03/17/2009   Dr.Rourk- 4cm segment of salmon-colored epithlium distal esophagus suspicious for barretts esophagus. area of suspicious nodularity w/in this segment bx seperately. small to moderate size hiatal hernia, o/w normal stomach D1 and D2 bx=adenocarcinoma  . FEMORAL-POPLITEAL BYPASS GRAFT  1993   occlusive ds in R SFA  . GASTRIC PULL THROUGH  2010   With esophagectomy  . LIPOMA EXCISION  2010   HPI: [name] a 70 y.o.male,with a history of insufficient cancer which is being treated, COPD, DM type 2, and CAD s/p CABG, who was admitted yesterday for possible aspiration in which he has severe ongoing reflux. He was found to have pneumonia in which he left AMA. He has returned to the ED After he was called by  the ER with a positive blood culture result which came from yesterday's visit. He is being admitted for further evaluation of sepsis secondary to pneumonia.   Subjective: "Sometimes I have food and phlegm come up at night."   Assessment / Plan / Recommendation  CHL IP CLINICAL IMPRESSIONS 05/26/2016  Therapy Diagnosis Suspected primary esophageal dysphagia  Clinical  Impression Pt seen for MBSS in lateral position in [place] chair and assessed with thin cup and straw, puree, regular textures, and barium tablet with thin. Oropharyngeal swallow is essentially WNL with only mild pyriform pooling with thin liquids after the primary swallow from residuals in valleculae/base of tongue spilling to pyriforms. Pt was generally not aware of pooling and did not always spontaneously clear. When cued to initiate a "dry swallow", pyriform residuals cleared. Several esophageal sweeps were completed due to suspicion of decreased motility. Incomplete clearance of puree and regular textures noted and stasis in what would be his thoracic esophagus (pt s/p esophagectomy with gastric pull up) which did not clear after one minute. Pt was then given liquid wash, which did clear the majority of bolus, but not all. Suspect that over the course of an entire meal, residuals would increase without liquid wash. Pt and his wife state that pt typically has very little fluid intake throughout the day. Recommend self regulated regular textures (pt advised by his surgeon to follow reflux diet and avoid dry,crunchy foods) and follow solids with liquids periodically to facilitate clearance to lower portion of stomach. Importance of reflux precautions were reinforced to pt and his wife. Pt was shown on video, stasis in mid chest. Please see additional images on PACS hyperlink tab at the top of this report. No further SLP services indicated at this time. Pt to follow up with Dr. [name] in a few weeks per wife. Above to Dr. Wyline Copas.  Impact on safety and function Mild aspiration risk      CHL IP TREATMENT RECOMMENDATION 05/26/2016  Treatment Recommendations No treatment recommended at this time     Prognosis 05/25/2016  Prognosis for Safe Diet Advancement Good  Barriers to Reach Goals --  Barriers/Prognosis Comment --    CHL IP DIET RECOMMENDATION 05/26/2016  SLP Diet Recommendations Regular solids;Thin liquid   Liquid Administration via Cup  Medication Administration Whole meds with liquid  Compensations Follow solids with liquid  Postural Changes Remain semi-upright after after feeds/meals (Comment);Seated upright at 90 degrees      CHL IP OTHER RECOMMENDATIONS 05/26/2016  Recommended Consults Consider esophageal assessment  Oral Care Recommendations Oral care BID  Other Recommendations Clarify dietary restrictions      CHL IP FOLLOW UP RECOMMENDATIONS 05/26/2016  Follow up Recommendations None      CHL IP FREQUENCY AND DURATION 05/25/2016  Speech Therapy Frequency (ACUTE ONLY) min 1 x/week  Treatment Duration 1 week           CHL IP ORAL PHASE 05/26/2016  Oral Phase WFL  Oral - Pudding Teaspoon --  Oral - Pudding Cup --  Oral - Honey Teaspoon --  Oral - Honey Cup --  Oral - Nectar Teaspoon --  Oral - Nectar Cup --  Oral - Nectar Straw --  Oral - Thin Teaspoon --  Oral - Thin Cup --  Oral - Thin Straw --  Oral - Puree --  Oral - Mech Soft --  Oral - Regular --  Oral - Multi-Consistency --  Oral - Pill --  Oral Phase - Comment --    CHL IP  PHARYNGEAL PHASE 05/26/2016  Pharyngeal Phase Impaired  Pharyngeal- Pudding Teaspoon --  Pharyngeal --  Pharyngeal- Pudding Cup --  Pharyngeal --  Pharyngeal- Honey Teaspoon --  Pharyngeal --  Pharyngeal- Honey Cup --  Pharyngeal --  Pharyngeal- Nectar Teaspoon --  Pharyngeal --  Pharyngeal- Nectar Cup --  Pharyngeal --  Pharyngeal- Nectar Straw --  Pharyngeal --  Pharyngeal- Thin Teaspoon --  Pharyngeal --  Pharyngeal- Thin Cup Pharyngeal residue - pyriform  Pharyngeal --  Pharyngeal- Thin Straw Pharyngeal residue - pyriform;Lateral channel residue  Pharyngeal --  Pharyngeal- Puree Pharyngeal residue - valleculae;Pharyngeal residue - posterior pharnyx  Pharyngeal --  Pharyngeal- Mechanical Soft --  Pharyngeal --  Pharyngeal- Regular WFL  Pharyngeal --  Pharyngeal- Multi-consistency --  Pharyngeal --  Pharyngeal- Pill  WFL  Pharyngeal --  Pharyngeal Comment min residuals thin in pyriforms post swallow from vallecular residue spilling to pyriforms with decreased awareness- cued swallow clears     CHL IP CERVICAL ESOPHAGEAL PHASE 05/26/2016  Cervical Esophageal Phase Impaired  Pudding Teaspoon --  Pudding Cup --  Honey Teaspoon --  Honey Cup --  Nectar Teaspoon --  Nectar Cup --  Nectar Straw --  Thin Teaspoon --  Thin Cup --  Thin Straw --  Puree Other (Comment)  Mechanical Soft --  Regular --  Multi-consistency --  Pill --  Cervical Esophageal Comment --    Thank you,  Genene Churn, [place]  Mell Mellott 05/26/2016, 3:23 PM

## 2016-05-26 NOTE — Progress Notes (Signed)
Speech Language Pathology Treatment:    Patient Details Name: Gregory Eaton MRN: [id number] DOB: Nov 08, 1945 Today's Date: 05/26/2016 Time:  - 10:14 AM   Chart reviewed and pt with history of esophageal CA in 2010 with esophagectomy. Pt has had several bouts of PNA and history of severe reflux. Pt also recently discharged from [facility] in September with PNA. Pt endorses night time regurgitation occasionally. Recommend MBSS to objectively evaluate swallow function, make appropriate modifications, and provide pt/caregiver education. Dr. Wyline Copas in agreement with plan.         Thank you,  Genene Churn, [place]  [place] 05/26/2016, 10:14 AM

## 2016-05-27 DIAGNOSIS — Z951 Presence of aortocoronary bypass graft: Secondary | ICD-10-CM

## 2016-05-27 LAB — CBC
HEMATOCRIT: 36 % — AB (ref 39.0–52.0)
Hemoglobin: 12 g/dL — ABNORMAL LOW (ref 13.0–17.0)
MCH: 30.2 pg (ref 26.0–34.0)
MCHC: 33.3 g/dL (ref 30.0–36.0)
MCV: 90.7 fL (ref 78.0–100.0)
PLATELETS: 256 10*3/uL (ref 150–400)
RBC: 3.97 MIL/uL — ABNORMAL LOW (ref 4.22–5.81)
RDW: 13.3 % (ref 11.5–15.5)
WBC: 13.6 10*3/uL — ABNORMAL HIGH (ref 4.0–10.5)

## 2016-05-27 LAB — CULTURE, BLOOD (ROUTINE X 2)

## 2016-05-27 LAB — GLUCOSE, CAPILLARY
GLUCOSE-CAPILLARY: 111 mg/dL — AB (ref 65–99)
Glucose-Capillary: 121 mg/dL — ABNORMAL HIGH (ref 65–99)
Glucose-Capillary: 130 mg/dL — ABNORMAL HIGH (ref 65–99)
Glucose-Capillary: 117 mg/dL — ABNORMAL HIGH (ref 65–99)

## 2016-05-27 NOTE — Progress Notes (Addendum)
PROGRESS NOTE    Gregory Eaton  [id number] DOB: 1945-10-13 DOA: 05/25/2016 PCP: Purvis Kilts, MD    Brief Narrative:  70 year old male history of esophageal cancer, COPD, type 2 diabetes, CAD status post CABG who initially presented with aspiration pneumonia. Patient left AGAINST MEDICAL ADVICE. Patient later returned emergency department after being found to have blood cultures that were positive. Patient was subsequently admitted for further workup.  Assessment & Plan:   Principal Problem:   Sepsis due to pneumonia [facility]) Active Problems:   Essential hypertension   CAP (community acquired pneumonia)   DM type 2 (diabetes mellitus, type 2) ([place])   S/P CABG x 5   COPD GOLD II    Sepsis ([place])   Healthcare-associated pneumonia  1. Sepsis secondary to aspiration pneumonia -Patient was initially continued on vancomycin and Zosyn, now on mono therapy with Zosyn -Appreciate input by speech pathology, recommendations for regular diet with thin liquids. -Patient noted to have 1 out of 2 coag negative staph species-likely contaminant. We'll discontinue vancomycin as a result -Presenting lactate of over 4 noted. Lactic acid has since improved -Patient remains hypoxic, requiring 4 L nasal cannula this morning. -Have ordered incentive spirometry, flutter valve -Wean O2 as tolerated with goal of room air. Of note, patient with 83% on room air this afternoon while at rest -Leukocytosis improving. Will repeat CBC in AM  2. COPD. -Remains clinically compensated at this time -Continue bronchodilators as needed -Patient is continued on supplemental oxygen, wean as tolerated per above  3. GERD. -Patient continued on Reglan with Protonix -Remains stable with no acute discomfort  4. DM type 2.  -Remains stable at present  -Patient is continued on diabetic diet   5. CAD s/p CABG.  -For now, continue Plavix, beta blocker, statin as tolerated -Patient does report some chest  pain, however is much more likely chest wall pain secondary to presenting shortness of breath and pneumonia  6. HTN.  -Low pressure remained stable this morning. -Continue beta blocker, ARB.  -Norvasc remains on hold for now  DVT prophylaxis: Heparin Code Status: Full code Family Communication: She room, family at bedside Disposition Plan: Uncertain at this time, anticipate discharge when able to wean off O2  Consultants:     Procedures:     Antimicrobials: Anti-infectives    Start     Dose/Rate Route Frequency Ordered Stop   05/25/16 2200  vancomycin (VANCOCIN) IVPB 1000 mg/200 mL premix  Status:  Discontinued     1,000 mg 200 mL/hr over 60 Minutes Intravenous Every 12 hours 05/25/16 1543 05/26/16 1705   05/25/16 1700  piperacillin-tazobactam (ZOSYN) IVPB 3.375 g     3.375 g 12.5 mL/hr over 240 Minutes Intravenous Every 8 hours 05/25/16 1543     05/25/16 1115  piperacillin-tazobactam (ZOSYN) IVPB 3.375 g     3.375 g 100 mL/hr over 30 Minutes Intravenous  Once 05/25/16 1105 05/25/16 1150   05/25/16 1115  vancomycin (VANCOCIN) IVPB 1000 mg/200 mL premix     1,000 mg 200 mL/hr over 60 Minutes Intravenous  Once 05/25/16 1105 05/25/16 1308      Subjective: Patient reports feeling well. Eager to go home  Objective: Vitals:   05/27/16 1306 05/27/16 1317 05/27/16 1320 05/27/16 1424  BP: 135/65     Pulse: 93     Resp:      Temp: 98.7 F (37.1 C)     TempSrc: Oral     SpO2: (!) 89% 90% 92% (!) 83%  Weight:  Height:        Intake/Output Summary (Last 24 hours) at 05/27/16 1720 Last data filed at 05/27/16 1200  Gross per 24 hour  Intake              480 ml  Output                0 ml  Net              480 ml   Filed Weights   05/25/16 1045 05/25/16 1601  Weight: 72.6 kg (160 lb) 71.7 kg (158 lb)    Examination:  General exam: Lying in bed, no acute distress  Respiratory system: Normal respiratory effort, no audible wheezing Cardiovascular system:  Regular rate, S1-S2. Gastrointestinal system: Soft, positive bowel sounds Central nervous system: CN II through XII grossly intact, sensation intact Extremities: Perfused, no clubbing Skin: Normal skin turgor, no notable skin lesions seen Psychiatry: Mood normal, no visual hallucinations   Data Reviewed: I have personally reviewed following labs and imaging studies  CBC:  Recent Labs Lab 05/24/16 1230 05/25/16 1119 05/26/16 0631 05/27/16 0628  WBC 26.4* 21.4* 18.9* 13.6*  NEUTROABS 23.5* 18.8*  --   --   HGB 15.4 13.2 12.7* 12.0*  HCT 46.0 39.3 38.0* 36.0*  MCV 93.5 92.0 91.3 90.7  PLT 250 222 244 [id number]   Basic Metabolic Panel:  Recent Labs Lab 05/24/16 1230 05/25/16 1119 05/26/16 0631  NA 135 134* 134*  K 4.0 3.8 3.5  CL 101 102 103  CO2 24 25 23   GLUCOSE 117* 114* 112*  BUN 20 11 9   CREATININE 0.86 0.84 0.72  CALCIUM 8.9 8.6* 8.2*   GFR: Estimated Creatinine Clearance: 81.9 mL/min (by C-G formula based on SCr of 0.72 mg/dL). Liver Function Tests:  Recent Labs Lab 05/25/16 1119  AST 14*  ALT 13*  ALKPHOS 63  BILITOT 0.9  PROT 6.8  ALBUMIN 3.1*   No results for input(s): LIPASE, AMYLASE in the last 168 hours. No results for input(s): AMMONIA in the last 168 hours. Coagulation Profile: No results for input(s): INR, PROTIME in the last 168 hours. Cardiac Enzymes: No results for input(s): CKTOTAL, CKMB, CKMBINDEX, TROPONINI in the last 168 hours. BNP (last 3 results) No results for input(s): PROBNP in the last 8760 hours. HbA1C:  Recent Labs  05/25/16 1119  HGBA1C 6.3*   CBG:  Recent Labs Lab 05/26/16 1638 05/26/16 2011 05/27/16 0742 05/27/16 1148 05/27/16 1612  GLUCAP 124* 135* 121* 111* 130*   Lipid Profile: No results for input(s): CHOL, HDL, LDLCALC, TRIG, CHOLHDL, LDLDIRECT in the last 72 hours. Thyroid Function Tests: No results for input(s): TSH, T4TOTAL, FREET4, T3FREE, THYROIDAB in the last 72 hours. Anemia Panel: No results for  input(s): VITAMINB12, FOLATE, FERRITIN, TIBC, IRON, RETICCTPCT in the last 72 hours. Sepsis Labs:  Recent Labs Lab 05/24/16 1231 05/24/16 1534 05/25/16 1129 05/25/16 1434  LATICACIDVEN 4.1* 2.9* 1.80 0.99    Recent Results (from the past 240 hour(s))  Culture, blood (Routine X 2) w Reflex to ID Panel     Status: Abnormal   Collection Time: 05/24/16 12:30 PM  Result Value Ref Range Status   Specimen Description BLOOD BLOOD LEFT ARM  Final   Special Requests BOTTLES DRAWN AEROBIC AND ANAEROBIC [facility] EACH  Final   Culture  Setup Time   Final    Anaerobic bottle recovered Gram Positive Cocci Gram Stain Report Called to,Read Back By and Verified With: White,M at 845am by H Flynt  05/25/16 CRITICAL RESULT CALLED TO, READ BACK BY AND VERIFIED WITH: C. KNIGHT, RN ([facility]) AT [place] ON 05/25/16 BY C. JESSUP, MLT.    Culture (A)  Final    STAPHYLOCOCCUS SPECIES (COAGULASE NEGATIVE) THE SIGNIFICANCE OF ISOLATING THIS ORGANISM FROM A SINGLE SET OF BLOOD CULTURES WHEN MULTIPLE SETS ARE DRAWN IS UNCERTAIN. PLEASE NOTIFY THE MICROBIOLOGY DEPARTMENT WITHIN ONE WEEK IF SPECIATION AND SENSITIVITIES ARE REQUIRED. Performed at [facility]    Report Status 05/27/2016 FINAL  Final  Culture, blood (Routine X 2) w Reflex to ID Panel     Status: None (Preliminary result)   Collection Time: 05/24/16 12:30 PM  Result Value Ref Range Status   Specimen Description BLOOD RIGHT ANTECUBITAL  Final   Special Requests BOTTLES DRAWN AEROBIC AND ANAEROBIC [place]  Final   Culture NO GROWTH 3 DAYS  Final   Report Status PENDING  Incomplete  Blood Culture ID Panel (Reflexed)     Status: Abnormal   Collection Time: 05/24/16 12:30 PM  Result Value Ref Range Status   Enterococcus species NOT DETECTED NOT DETECTED Final   Listeria monocytogenes NOT DETECTED NOT DETECTED Final   Staphylococcus species DETECTED (A) NOT DETECTED Final    Comment: CRITICAL RESULT CALLED TO, READ BACK BY AND VERIFIED WITH: C. KNIGHT,  RN ([facility]) AT [place] ON 05/25/16 BY C. JESSUP, MLT.    Staphylococcus aureus NOT DETECTED NOT DETECTED Final   Methicillin resistance NOT DETECTED NOT DETECTED Final   Streptococcus species NOT DETECTED NOT DETECTED Final   Streptococcus agalactiae NOT DETECTED NOT DETECTED Final   Streptococcus pneumoniae NOT DETECTED NOT DETECTED Final   Streptococcus pyogenes NOT DETECTED NOT DETECTED Final   Acinetobacter baumannii NOT DETECTED NOT DETECTED Final   Enterobacteriaceae species NOT DETECTED NOT DETECTED Final   Enterobacter cloacae complex NOT DETECTED NOT DETECTED Final   Escherichia coli NOT DETECTED NOT DETECTED Final   Klebsiella oxytoca NOT DETECTED NOT DETECTED Final   Klebsiella pneumoniae NOT DETECTED NOT DETECTED Final   Proteus species NOT DETECTED NOT DETECTED Final   Serratia marcescens NOT DETECTED NOT DETECTED Final   Haemophilus influenzae NOT DETECTED NOT DETECTED Final   Neisseria meningitidis NOT DETECTED NOT DETECTED Final   Pseudomonas aeruginosa NOT DETECTED NOT DETECTED Final   Candida albicans NOT DETECTED NOT DETECTED Final   Candida glabrata NOT DETECTED NOT DETECTED Final   Candida krusei NOT DETECTED NOT DETECTED Final   Candida parapsilosis NOT DETECTED NOT DETECTED Final   Candida tropicalis NOT DETECTED NOT DETECTED Final    Comment: Performed at [facility]     Radiology Studies: Dg Swallowing Func-speech Pathology  Result Date: 05/26/2016 Objective Swallowing Evaluation: Type of Study: MBS-Modified Barium Swallow Study Patient Details Name: Gregory Eaton MRN: [id number] Date of Birth: 12-27-1945  Today's Date: 05/26/2016 Time: SLP Start Time (ACUTE ONLY): 1022-SLP Stop Time (ACUTE ONLY): 1115 SLP Time Calculation (min) (ACUTE ONLY): 53 min  Past Medical History:    Past Medical History: Diagnosis Date . Adenomatous colon polyp 03/2009  Last colonoscopy by Dr. [name]  . Adenomatous polyp 2010 . Adenomatous polyp of colon 11/03/2010 . Barrett's esophagus   . CAD (coronary artery disease)  . COPD (chronic obstructive pulmonary disease) ([facility])   Severe emphysema per CT . Diverticulosis  . DM type 2 (diabetes mellitus, type 2) ([place]) 04/09/2014  A1c 6.5 (04/08/14)  . Esophageal carcinoma ([place]) 03/2009  T1N1M0 . GERD (gastroesophageal reflux disease)  . History of Doppler ultrasound 11/09/2011  03/2014-  50-69% L ICA stenosis;carotid doppler; L bulb/prox ICA 0-49% diameter reduction; L vertebral artery - occlusive ds; L ECA  demonstrates severe amount of fibrous plaque . History of Doppler ultrasound 11/09/11  LEAs; R ABI - mod art. insuff.; L ABI normal at rest; R SFA - occlusive ds; L SFA - occlusive ds; patent fem-pop graft . History of echocardiogram 08/27/2009  EF >55% . History of nuclear stress test 11/24/2011  lexiscan; normal perfusion; low risk scan; non-diagnostic for ischemia . Hyperlipidemia  . Hypertension  . Left carotid artery stenosis 04/08/2014 . Pulmonary nodule, right 04/08/2014  2.8 mm-incidental finding on CT . PVD (peripheral vascular disease) ([place])  . Tachyarrhythmia 1999  Status post ablation at [facility] . Tobacco abuse   Past Surgical History:     Past Surgical History: Procedure Laterality Date . COLONOSCOPY  03/17/2009  Dr.Rourk- normal rectum, sigmoid diverticula, some pale sigmoid mucosa with diffuse petechiae. pedunculated polyp at the splenic flexure, remainder of colonic mucosa appeared normal. bx= adenomatous polyp . COLONOSCOPY  04/19/2003  Dr.Rehman- few diverticu;a at the sigmoid colon, 3 small polyps, one at the transverse colon and 2 at the sigmoid, small external hemorrhoids. bx report not available.  . CORONARY ARTERY BYPASS GRAFT  1998  [name] . ESOPHAGECTOMY  2010  [facility] Dr. [name] . ESOPHAGOGASTRODUODENOSCOPY  11/25/2010  Dr.Rourk- s/p esophagectomy with gastric pull-up, esophageal erosions straddling the surgical anastomosis, salmon colored epithelium coming up a good centimeter to a centimeter and a half  above the suture line, islands of salmon colored epithelium in the most poximal residual esophagus, remainder of gastric mucosa appeared normal. bx= swamous &gastric glandular mucosa w/chronic active inflammation . ESOPHAGOGASTRODUODENOSCOPY  03/17/2009  Dr.Rourk- 4cm segment of salmon-colored epithlium distal esophagus suspicious for barretts esophagus. area of suspicious nodularity w/in this segment bx seperately. small to moderate size hiatal hernia, o/w normal stomach D1 and D2 bx=adenocarcinoma . FEMORAL-POPLITEAL BYPASS GRAFT  1993  occlusive ds in R SFA . GASTRIC PULL THROUGH  2010  With esophagectomy . LIPOMA EXCISION  2010  HPI: [name] a 70 y.o.male,with a history of insufficient cancer which is being treated, COPD, DM type 2, and CAD s/p CABG, who was admitted yesterday for possible aspiration in which he has severe ongoing reflux. He was found to have pneumonia in which he left AMA. He has returned to the ED After he was called by the ER with a positive blood culture result which came from yesterday's visit. He is being admitted for further evaluation of sepsis secondary to pneumonia.  Subjective: "Sometimes I have food and phlegm come up at night."   Assessment / Plan / Recommendation  CHL IP CLINICAL IMPRESSIONS 05/26/2016 Therapy Diagnosis Suspected primary esophageal dysphagia Clinical Impression Pt seen for MBSS in lateral position in [place] chair and assessed with thin cup and straw, puree, regular textures, and barium tablet with thin. Oropharyngeal swallow is essentially WNL with only mild pyriform pooling with thin liquids after the primary swallow from residuals in valleculae/base of tongue spilling to pyriforms. Pt was generally not aware of pooling and did not always spontaneously clear. When cued to initiate a "dry swallow", pyriform residuals cleared. Several esophageal sweeps were completed due to suspicion of decreased motility. Incomplete clearance of puree and  regular textures noted and stasis in what would be his thoracic esophagus (pt s/p esophagectomy with gastric pull up) which did not clear after one minute. Pt was then given liquid wash, which did clear the majority of bolus, but not all.  Suspect that over the course of an entire meal, residuals would increase without liquid wash. Pt and his wife state that pt typically has very little fluid intake throughout the day. Recommend self regulated regular textures (pt advised by his surgeon to follow reflux diet and avoid dry,crunchy foods) and follow solids with liquids periodically to facilitate clearance to lower portion of stomach. Importance of reflux precautions were reinforced to pt and his wife. Pt was shown on video, stasis in mid chest. Please see additional images on PACS hyperlink tab at the top of this report. No further SLP services indicated at this time. Pt to follow up with Dr. [name] in a few weeks per wife. Above to Dr. Wyline Copas. Impact on safety and function Mild aspiration risk    CHL IP TREATMENT RECOMMENDATION 05/26/2016 Treatment Recommendations No treatment recommended at this time   Prognosis 05/25/2016 Prognosis for Safe Diet Advancement Good Barriers to Reach Goals -- Barriers/Prognosis Comment --   CHL IP DIET RECOMMENDATION 05/26/2016 SLP Diet Recommendations Regular solids;Thin liquid Liquid Administration via Cup Medication Administration Whole meds with liquid Compensations Follow solids with liquid Postural Changes Remain semi-upright after after feeds/meals (Comment);Seated upright at 90 degrees    CHL IP OTHER RECOMMENDATIONS 05/26/2016 Recommended Consults Consider esophageal assessment Oral Care Recommendations Oral care BID Other Recommendations Clarify dietary restrictions    CHL IP FOLLOW UP RECOMMENDATIONS 05/26/2016 Follow up Recommendations None    CHL IP FREQUENCY AND DURATION 05/25/2016 Speech Therapy Frequency (ACUTE ONLY) min 1 x/week Treatment Duration 1 week        CHL IP ORAL PHASE 05/26/2016 Oral Phase WFL Oral - Pudding Teaspoon -- Oral - Pudding Cup -- Oral - Honey Teaspoon -- Oral - Honey Cup -- Oral - Nectar Teaspoon -- Oral - Nectar Cup -- Oral - Nectar Straw -- Oral - Thin Teaspoon -- Oral - Thin Cup -- Oral - Thin Straw -- Oral - Puree -- Oral - Mech Soft -- Oral - Regular -- Oral - Multi-Consistency -- Oral - Pill -- Oral Phase - Comment --  CHL IP PHARYNGEAL PHASE 05/26/2016 Pharyngeal Phase Impaired Pharyngeal- Pudding Teaspoon -- Pharyngeal -- Pharyngeal- Pudding Cup -- Pharyngeal -- Pharyngeal- Honey Teaspoon -- Pharyngeal -- Pharyngeal- Honey Cup -- Pharyngeal -- Pharyngeal- Nectar Teaspoon -- Pharyngeal -- Pharyngeal- Nectar Cup -- Pharyngeal -- Pharyngeal- Nectar Straw -- Pharyngeal -- Pharyngeal- Thin Teaspoon -- Pharyngeal -- Pharyngeal- Thin Cup Pharyngeal residue - pyriform Pharyngeal -- Pharyngeal- Thin Straw Pharyngeal residue - pyriform;Lateral channel residue Pharyngeal -- Pharyngeal- Puree Pharyngeal residue - valleculae;Pharyngeal residue - posterior pharnyx Pharyngeal -- Pharyngeal- Mechanical Soft -- Pharyngeal -- Pharyngeal- Regular WFL Pharyngeal -- Pharyngeal- Multi-consistency -- Pharyngeal -- Pharyngeal- Pill WFL Pharyngeal -- Pharyngeal Comment min residuals thin in pyriforms post swallow from vallecular residue spilling to pyriforms with decreased awareness- cued swallow clears   CHL IP CERVICAL ESOPHAGEAL PHASE 05/26/2016 Cervical Esophageal Phase Impaired Pudding Teaspoon -- Pudding Cup -- Honey Teaspoon -- Honey Cup -- Nectar Teaspoon -- Nectar Cup -- Nectar Straw -- Thin Teaspoon -- Thin Cup -- Thin Straw -- Puree Other (Comment) Mechanical Soft -- Regular -- Multi-consistency -- Pill -- Cervical Esophageal Comment --   Thank you,  Genene Churn, CCC-SLP [phone]  [place] 05/26/2016, 3:23 PM      Scheduled Meds: . albuterol  2.5 mg Nebulization Q6H  . ALPRAZolam  0.5 mg Oral QHS  . aspirin EC  81 mg Oral Daily  .  clopidogrel  75 mg Oral Daily  . heparin  5,000 Units Subcutaneous Q8H  . insulin aspart  0-9 Units Subcutaneous TID WC  . latanoprost  1 drop Both Eyes QHS  . losartan  25 mg Oral Daily  . metoCLOPramide  5 mg Oral TID AC  . metoprolol succinate  50 mg Oral Daily  . mometasone-formoterol  2 puff Inhalation BID  . montelukast  10 mg Oral Daily  . pantoprazole  40 mg Oral BID  . piperacillin-tazobactam (ZOSYN)  IV  3.375 g Intravenous Q8H  . polyethylene glycol  17 g Oral BID  . rosuvastatin  10 mg Oral Daily  . sucralfate  1 g Oral TID WC & HS  . timolol  1 drop Both Eyes Daily  . tiotropium  18 mcg Inhalation Daily   Continuous Infusions:   LOS: 2 days   Lexus Barletta, Orpah Melter, MD [facility] Pager [phone]  If 7PM-7AM, please contact night-coverage [email or web address] Password [email or web address] 05/27/2016, 5:20 PM

## 2016-05-28 ENCOUNTER — Inpatient Hospital Stay (HOSPITAL_COMMUNITY): Payer: Medicare Other

## 2016-05-28 LAB — GLUCOSE, CAPILLARY
GLUCOSE-CAPILLARY: 99 mg/dL (ref 65–99)
Glucose-Capillary: 115 mg/dL — ABNORMAL HIGH (ref 65–99)
Glucose-Capillary: 105 mg/dL — ABNORMAL HIGH (ref 65–99)
Glucose-Capillary: 107 mg/dL — ABNORMAL HIGH (ref 65–99)

## 2016-05-28 LAB — CBC
HEMATOCRIT: 37.4 % — AB (ref 39.0–52.0)
HEMOGLOBIN: 12.6 g/dL — AB (ref 13.0–17.0)
MCH: 30.4 pg (ref 26.0–34.0)
MCHC: 33.7 g/dL (ref 30.0–36.0)
MCV: 90.1 fL (ref 78.0–100.0)
Platelets: 307 10*3/uL (ref 150–400)
RBC: 4.15 MIL/uL — ABNORMAL LOW (ref 4.22–5.81)
RDW: 13.4 % (ref 11.5–15.5)
WBC: 11.9 10*3/uL — AB (ref 4.0–10.5)

## 2016-05-28 LAB — EXPECTORATED SPUTUM ASSESSMENT W REFEX TO RESP CULTURE

## 2016-05-28 LAB — EXPECTORATED SPUTUM ASSESSMENT W GRAM STAIN, RFLX TO RESP C

## 2016-05-28 LAB — STREP PNEUMONIAE URINARY ANTIGEN: Strep Pneumo Urinary Antigen: NEGATIVE

## 2016-05-28 MED ORDER — KETOROLAC TROMETHAMINE 30 MG/ML IJ SOLN
30.0000 mg | Freq: Four times a day (QID) | INTRAMUSCULAR | Status: AC | PRN
  Administered 2016-05-28: 30 mg via INTRAVENOUS
  Filled 2016-05-28 (×2): qty 1

## 2016-05-28 MED ORDER — LEVOFLOXACIN IN D5W 750 MG/150ML IV SOLN
750.0000 mg | INTRAVENOUS | Status: DC
  Administered 2016-05-28 – 2016-05-31 (×4): 750 mg via INTRAVENOUS
  Filled 2016-05-28 (×4): qty 150

## 2016-05-28 NOTE — Progress Notes (Addendum)
Pharmacy Antibiotic Note  Gregory Eaton is a 70 y.o. male admitted on 05/25/2016 with Aspiration Pneumonia.  Pharmacy was initially consulted for zosyn and vancomycin.  Now change to levaquin  Plan: Levaquin 750 mg IV q24 hours F/U cultures and monitor clinical progress  Height: 5\' 8"  (172.7 cm) Weight: 158 lb (71.7 kg) IBW/kg (Calculated) : 68.4  Temp (24hrs), Avg:99.4 F (37.4 C), Min:98.7 F (37.1 C), Max:100.6 F (38.1 C)   Recent Labs Lab 05/24/16 1230 05/24/16 1231 05/24/16 1534 05/25/16 1119 05/25/16 1129 05/25/16 1434 05/26/16 0631 05/27/16 0628 05/28/16 0713  WBC 26.4*  --   --  21.4*  --   --  18.9* 13.6* 11.9*  CREATININE 0.86  --   --  0.84  --   --  0.72  --   --   LATICACIDVEN  --  4.1* 2.9*  --  1.80 0.99  --   --   --     Estimated Creatinine Clearance: 81.9 mL/min (by C-G formula based on SCr of 0.72 mg/dL).    Allergies  Allergen Reactions  . Altace [Ramipril] Cough  . Prednisone Other (See Comments)    "throat broke out"     Antimicrobials this admission: Levaquin  11/10>> Vancomycin 11/7>> 11/9 Zosyn 11/7 >> 11/10 Ceftriaxone 11/6 x 1 dose in ED  Dose adjustments this admission: n/a  Microbiology results: 11/6 BCx: 1/2 CoNS  Thank you for allowing pharmacy to be a part of this patient's care.  Excell Seltzer, PharmD Clinical Pharmacist 05/28/2016 10:41 AM

## 2016-05-28 NOTE — Progress Notes (Signed)
PROGRESS NOTE    Gregory Eaton  [id number] DOB: 1946/06/25 DOA: 05/25/2016 PCP: Purvis Kilts, MD    Brief Narrative:  70 year old male history of esophageal cancer, COPD, type 2 diabetes, CAD status post CABG who initially presented with aspiration pneumonia. Patient left AGAINST MEDICAL ADVICE. Patient later returned emergency department after being found to have blood cultures that were positive. Patient was subsequently admitted for further workup.  Assessment & Plan:   Principal Problem:   Sepsis due to pneumonia [facility]) Active Problems:   Essential hypertension   CAP (community acquired pneumonia)   DM type 2 (diabetes mellitus, type 2) ([place])   S/P CABG x 5   COPD GOLD II    Sepsis ([place])   Healthcare-associated pneumonia  1. Sepsis secondary to aspiration pneumonia -Patient was initially continued on vancomycin and Zosyn, presently on Zosyn monotherapy -Appreciate input by speech pathology, recommendations for regular diet with thin liquids. -Patient noted to have 1 out of 2 coag negative staph species-likely contaminant.  -Presenting lactate of over 4 noted. Lactic acid has since improved -Leukocytosis improving, have reviewed labs -MAXIMUM TEMPERATURE noted to be 100.60F overnight -Patient remains dependent on 4 L nasal cannula today. -Ordered follow-up chest x-ray is reviewed by myself. Findings of worsening pneumonia -Discussed with pharmacy, plan to transition to [place] today -Continue to wean O2 as tolerated -Repeat CBC in morning  2. COPD. -Audible wheezing auscultated on exam today -Patient to continue bronchodilators as a shunt has reported improvement with breathing treatments  3. GERD. -Stable at present, patient will continue Reglan and Protonix  4. DM type 2.  -Glucose trends reviewed. Overall stable and controlled -Continue diabetic diet -Continue sliding scale insulin  5. CAD s/p CABG.  -For now, continue Plavix, beta blocker,  statin as tolerated -Stable at present.  6. HTN.  -Vital signs reviewed. Blood pressure stable -Patient to continue beta blocker and ARB.  -Norvasc currently on hold. If blood pressure continues to rise, consider resuming Norvasc  DVT prophylaxis: Heparin Code Status: Full code Family Communication: She room, family at bedside Disposition Plan: Uncertain at this time, anticipate discharge when able to wean off O2  Consultants:     Procedures:     Antimicrobials: Anti-infectives    Start     Dose/Rate Route Frequency Ordered Stop   05/28/16 1115  levofloxacin (LEVAQUIN) IVPB 750 mg     750 mg 100 mL/hr over 90 Minutes Intravenous Every 24 hours 05/28/16 1104     05/25/16 2200  vancomycin (VANCOCIN) IVPB 1000 mg/200 mL premix  Status:  Discontinued     1,000 mg 200 mL/hr over 60 Minutes Intravenous Every 12 hours 05/25/16 1543 05/26/16 1705   05/25/16 1700  piperacillin-tazobactam (ZOSYN) IVPB 3.375 g  Status:  Discontinued     3.375 g 12.5 mL/hr over 240 Minutes Intravenous Every 8 hours 05/25/16 1543 05/28/16 1057   05/25/16 1115  piperacillin-tazobactam (ZOSYN) IVPB 3.375 g     3.375 g 100 mL/hr over 30 Minutes Intravenous  Once 05/25/16 1105 05/25/16 1150   05/25/16 1115  vancomycin (VANCOCIN) IVPB 1000 mg/200 mL premix     1,000 mg 200 mL/hr over 60 Minutes Intravenous  Once 05/25/16 1105 05/25/16 1308      Subjective: Still eager to go home, however willing to stay to get better first  Objective: Vitals:   05/28/16 0208 05/28/16 0653 05/28/16 0752 05/28/16 1300  BP:  (!) 143/66  (!) 142/61  Pulse:  93  83  Resp:  20  20  Temp:  99 F (37.2 C)  98.6 F (37 C)  TempSrc:  Oral  Oral  SpO2: 93% 90% (!) 88% 92%  Weight:      Height:        Intake/Output Summary (Last 24 hours) at 05/28/16 1446 Last data filed at 05/28/16 0900  Gross per 24 hour  Intake              480 ml  Output                0 ml  Net              480 ml   Filed Weights    05/25/16 1045 05/25/16 1601  Weight: 72.6 kg (160 lb) 71.7 kg (158 lb)    Examination:  General exam: Lying in bed, appears mildly uncomfortable, conversant  Respiratory system: Mildly increased respiratory effort, end expiratory wheezing Cardiovascular system: Regular rhythm, S1-S2 Gastrointestinal system: Nondistended, no masses palpable Central nervous system: No seizures, no tremors Extremities: No cyanosis, no joint deformities Skin: No pallor, no rashes Psychiatry: Affect normal, no auditory hallucinations  Data Reviewed: I have personally reviewed following labs and imaging studies  CBC:  Recent Labs Lab 05/24/16 1230 05/25/16 1119 05/26/16 0631 05/27/16 0628 05/28/16 0713  WBC 26.4* 21.4* 18.9* 13.6* 11.9*  NEUTROABS 23.5* 18.8*  --   --   --   HGB 15.4 13.2 12.7* 12.0* 12.6*  HCT 46.0 39.3 38.0* 36.0* 37.4*  MCV 93.5 92.0 91.3 90.7 90.1  PLT 250 222 244 256 [id number]   Basic Metabolic Panel:  Recent Labs Lab 05/24/16 1230 05/25/16 1119 05/26/16 0631  NA 135 134* 134*  K 4.0 3.8 3.5  CL 101 102 103  CO2 24 25 23   GLUCOSE 117* 114* 112*  BUN 20 11 9   CREATININE 0.86 0.84 0.72  CALCIUM 8.9 8.6* 8.2*   GFR: Estimated Creatinine Clearance: 81.9 mL/min (by C-G formula based on SCr of 0.72 mg/dL). Liver Function Tests:  Recent Labs Lab 05/25/16 1119  AST 14*  ALT 13*  ALKPHOS 63  BILITOT 0.9  PROT 6.8  ALBUMIN 3.1*   No results for input(s): LIPASE, AMYLASE in the last 168 hours. No results for input(s): AMMONIA in the last 168 hours. Coagulation Profile: No results for input(s): INR, PROTIME in the last 168 hours. Cardiac Enzymes: No results for input(s): CKTOTAL, CKMB, CKMBINDEX, TROPONINI in the last 168 hours. BNP (last 3 results) No results for input(s): PROBNP in the last 8760 hours. HbA1C: No results for input(s): HGBA1C in the last 72 hours. CBG:  Recent Labs Lab 05/27/16 1148 05/27/16 1612 05/27/16 2014 05/28/16 0736 05/28/16 1118    GLUCAP 111* 130* 117* 115* 99   Lipid Profile: No results for input(s): CHOL, HDL, LDLCALC, TRIG, CHOLHDL, LDLDIRECT in the last 72 hours. Thyroid Function Tests: No results for input(s): TSH, T4TOTAL, FREET4, T3FREE, THYROIDAB in the last 72 hours. Anemia Panel: No results for input(s): VITAMINB12, FOLATE, FERRITIN, TIBC, IRON, RETICCTPCT in the last 72 hours. Sepsis Labs:  Recent Labs Lab 05/24/16 1231 05/24/16 1534 05/25/16 1129 05/25/16 1434  LATICACIDVEN 4.1* 2.9* 1.80 0.99    Recent Results (from the past 240 hour(s))  Culture, blood (Routine X 2) w Reflex to ID Panel     Status: Abnormal   Collection Time: 05/24/16 12:30 PM  Result Value Ref Range Status   Specimen Description BLOOD BLOOD LEFT ARM  Final   Special Requests BOTTLES DRAWN AEROBIC  AND ANAEROBIC [facility] EACH  Final   Culture  Setup Time   Final    Anaerobic bottle recovered Gram Positive Cocci Gram Stain Report Called to,Read Back By and Verified With: White,M at 845am by H Flynt  05/25/16 CRITICAL RESULT CALLED TO, READ BACK BY AND VERIFIED WITH: C. KNIGHT, RN ([facility]) AT [place] ON 05/25/16 BY C. JESSUP, MLT.    Culture (A)  Final    STAPHYLOCOCCUS SPECIES (COAGULASE NEGATIVE) THE SIGNIFICANCE OF ISOLATING THIS ORGANISM FROM A SINGLE SET OF BLOOD CULTURES WHEN MULTIPLE SETS ARE DRAWN IS UNCERTAIN. PLEASE NOTIFY THE MICROBIOLOGY DEPARTMENT WITHIN ONE WEEK IF SPECIATION AND SENSITIVITIES ARE REQUIRED. Performed at [facility]    Report Status 05/27/2016 FINAL  Final  Culture, blood (Routine X 2) w Reflex to ID Panel     Status: None (Preliminary result)   Collection Time: 05/24/16 12:30 PM  Result Value Ref Range Status   Specimen Description BLOOD RIGHT ANTECUBITAL  Final   Special Requests BOTTLES DRAWN AEROBIC AND ANAEROBIC [place]  Final   Culture NO GROWTH 4 DAYS  Final   Report Status PENDING  Incomplete  Blood Culture ID Panel (Reflexed)     Status: Abnormal   Collection Time: 05/24/16 12:30 PM   Result Value Ref Range Status   Enterococcus species NOT DETECTED NOT DETECTED Final   Listeria monocytogenes NOT DETECTED NOT DETECTED Final   Staphylococcus species DETECTED (A) NOT DETECTED Final    Comment: CRITICAL RESULT CALLED TO, READ BACK BY AND VERIFIED WITH: C. KNIGHT, RN ([facility]) AT [place] ON 05/25/16 BY C. JESSUP, MLT.    Staphylococcus aureus NOT DETECTED NOT DETECTED Final   Methicillin resistance NOT DETECTED NOT DETECTED Final   Streptococcus species NOT DETECTED NOT DETECTED Final   Streptococcus agalactiae NOT DETECTED NOT DETECTED Final   Streptococcus pneumoniae NOT DETECTED NOT DETECTED Final   Streptococcus pyogenes NOT DETECTED NOT DETECTED Final   Acinetobacter baumannii NOT DETECTED NOT DETECTED Final   Enterobacteriaceae species NOT DETECTED NOT DETECTED Final   Enterobacter cloacae complex NOT DETECTED NOT DETECTED Final   Escherichia coli NOT DETECTED NOT DETECTED Final   Klebsiella oxytoca NOT DETECTED NOT DETECTED Final   Klebsiella pneumoniae NOT DETECTED NOT DETECTED Final   Proteus species NOT DETECTED NOT DETECTED Final   Serratia marcescens NOT DETECTED NOT DETECTED Final   Haemophilus influenzae NOT DETECTED NOT DETECTED Final   Neisseria meningitidis NOT DETECTED NOT DETECTED Final   Pseudomonas aeruginosa NOT DETECTED NOT DETECTED Final   Candida albicans NOT DETECTED NOT DETECTED Final   Candida glabrata NOT DETECTED NOT DETECTED Final   Candida krusei NOT DETECTED NOT DETECTED Final   Candida parapsilosis NOT DETECTED NOT DETECTED Final   Candida tropicalis NOT DETECTED NOT DETECTED Final    Comment: Performed at [facility]  Culture, expectorated sputum-assessment     Status: None   Collection Time: 05/28/16  9:00 AM  Result Value Ref Range Status   Specimen Description EXPECTORATED SPUTUM  Final   Special Requests NONE  Final   Sputum evaluation   Final    THIS SPECIMEN IS ACCEPTABLE. RESPIRATORY CULTURE REPORT TO FOLLOW. Performed  at [facility]    Report Status 05/28/2016 FINAL  Final  Culture, respiratory (NON-Expectorated)     Status: None (Preliminary result)   Collection Time: 05/28/16  9:00 AM  Result Value Ref Range Status   Specimen Description EXPECTORATED SPUTUM  Final   Special Requests NONE  Final  Gram Stain   Final    MODERATE WBC PRESENT, PREDOMINANTLY PMN FEW SQUAMOUS EPITHELIAL CELLS PRESENT MODERATE GRAM POSITIVE COCCI IN CLUSTERS FEW GRAM POSITIVE RODS FEW GRAM NEGATIVE RODS FEW GRAM POSITIVE COCCI IN PAIRS FEW GRAM NEGATIVE COCCOBACILLI Performed at [facility]    Culture PENDING  Incomplete   Report Status PENDING  Incomplete     Radiology Studies: Dg Chest Port 1 View  Result Date: 05/28/2016 CLINICAL DATA:  Shortness of breath today. Recent diagnosis cysts of middle lobe pneumonia. History of COPD, coronary artery disease and CABG, former smoker. EXAM: PORTABLE CHEST 1 VIEW COMPARISON:  PA and lateral chest x-ray of May 24, 2016. FINDINGS: The left lung is mildly hyperinflated and clear. On the right progressive increase in interstitial density has occurred at the lung base. There is obscuration of portions of the hemidiaphragm and right heart border. The heart is top-normal in size. The pulmonary vascularity is not engorged. There are post CABG changes. There is calcification in the wall of the aortic arch. IMPRESSION: Worsening of right mid and lower lobe density consistent with progressive pneumonia. Underlying COPD. No CHF. Aortic atherosclerosis. Electronically Signed   By: David  Martinique M.D.   On: 05/28/2016 09:05    Scheduled Meds: . albuterol  2.5 mg Nebulization Q6H  . ALPRAZolam  0.5 mg Oral QHS  . aspirin EC  81 mg Oral Daily  . clopidogrel  75 mg Oral Daily  . heparin  5,000 Units Subcutaneous Q8H  . insulin aspart  0-9 Units Subcutaneous TID WC  . latanoprost  1 drop Both Eyes QHS  . levofloxacin (LEVAQUIN) IV  750 mg Intravenous Q24H  . losartan   25 mg Oral Daily  . metoCLOPramide  5 mg Oral TID AC  . metoprolol succinate  50 mg Oral Daily  . mometasone-formoterol  2 puff Inhalation BID  . montelukast  10 mg Oral Daily  . pantoprazole  40 mg Oral BID  . polyethylene glycol  17 g Oral BID  . rosuvastatin  10 mg Oral Daily  . sucralfate  1 g Oral TID WC & HS  . timolol  1 drop Both Eyes Daily  . tiotropium  18 mcg Inhalation Daily   Continuous Infusions:   LOS: 3 days   Sonali Wivell, Orpah Melter, MD [facility] Pager [phone]  If 7PM-7AM, please contact night-coverage [email or web address] Password [email or web address] 05/28/2016, 2:46 PM

## 2016-05-28 NOTE — Care Management Important Message (Signed)
Important Message  Patient Details  Name: Gregory Eaton MRN: [id number] Date of Birth: [birth year]   Medicare Important Message Given:  Yes    Annakate Soulier, Chauncey Reading, RN 05/28/2016, 12:44 PM

## 2016-05-28 NOTE — Progress Notes (Signed)
Patient complain of right shoulder pain. Dr. Wyline Copas notified.

## 2016-05-28 NOTE — Care Management Note (Signed)
Case Management Note  Patient Details  Name: Gregory Eaton MRN: [id number] Date of Birth: [birth year]    Expected Discharge Date:       05/28/2016           Expected Discharge Plan:  Home/Self Care  In-House Referral:  NA  Discharge planning Services  CM Consult  Post Acute Care Choice:  NA Choice offered to:  NA  DME Arranged:    DME Agency:     HH Arranged:    HH Agency:     Status of Service:  In process, will continue to follow  If discussed at Long Length of Stay Meetings, dates discussed:    Additional Comments: Patient adm from home with PNA. Ind with ADL's, following for oxygen needs.  Jilliane Kazanjian, Chauncey Reading, RN 05/28/2016, 12:44 PM

## 2016-05-29 LAB — CBC
HCT: 36.1 % — ABNORMAL LOW (ref 39.0–52.0)
HEMOGLOBIN: 12.1 g/dL — AB (ref 13.0–17.0)
MCH: 30.3 pg (ref 26.0–34.0)
MCHC: 33.5 g/dL (ref 30.0–36.0)
MCV: 90.3 fL (ref 78.0–100.0)
Platelets: 340 10*3/uL (ref 150–400)
RBC: 4 MIL/uL — ABNORMAL LOW (ref 4.22–5.81)
RDW: 13.4 % (ref 11.5–15.5)
WBC: 10.3 10*3/uL (ref 4.0–10.5)

## 2016-05-29 LAB — GLUCOSE, CAPILLARY
GLUCOSE-CAPILLARY: 107 mg/dL — AB (ref 65–99)
GLUCOSE-CAPILLARY: 121 mg/dL — AB (ref 65–99)
GLUCOSE-CAPILLARY: 105 mg/dL — AB (ref 65–99)
GLUCOSE-CAPILLARY: 121 mg/dL — AB (ref 65–99)

## 2016-05-29 LAB — BASIC METABOLIC PANEL
ANION GAP: 8 (ref 5–15)
BUN: 8 mg/dL (ref 6–20)
CALCIUM: 8.2 mg/dL — AB (ref 8.9–10.3)
CO2: 25 mmol/L (ref 22–32)
Chloride: 105 mmol/L (ref 101–111)
Creatinine, Ser: 0.66 mg/dL (ref 0.61–1.24)
GLUCOSE: 110 mg/dL — AB (ref 65–99)
POTASSIUM: 2.9 mmol/L — AB (ref 3.5–5.1)
SODIUM: 138 mmol/L (ref 135–145)

## 2016-05-29 LAB — CULTURE, BLOOD (ROUTINE X 2): Culture: NO GROWTH

## 2016-05-29 MED ORDER — BENZONATATE 100 MG PO CAPS
100.0000 mg | ORAL_CAPSULE | Freq: Three times a day (TID) | ORAL | Status: DC | PRN
  Administered 2016-05-30 – 2016-05-31 (×2): 100 mg via ORAL
  Filled 2016-05-29 (×2): qty 1

## 2016-05-29 MED ORDER — AMLODIPINE BESYLATE 5 MG PO TABS
5.0000 mg | ORAL_TABLET | Freq: Every day | ORAL | Status: DC
  Administered 2016-05-29 – 2016-06-03 (×6): 5 mg via ORAL
  Filled 2016-05-29 (×6): qty 1

## 2016-05-29 MED ORDER — POTASSIUM CHLORIDE CRYS ER 20 MEQ PO TBCR
40.0000 meq | EXTENDED_RELEASE_TABLET | Freq: Two times a day (BID) | ORAL | Status: AC
  Administered 2016-05-29 (×2): 40 meq via ORAL
  Filled 2016-05-29 (×2): qty 2

## 2016-05-29 NOTE — Progress Notes (Addendum)
Pt walked one lap around the nurses station and back to room on RA. No obvious respiratory distress noted. Pt's O2 dropped to 86%, but quickly recovered back to 90% still on RA when pt sat down for only a minute. Mostly clear BBS. Rechecked pt at 2030, pt resting comfortably, SpO2 90% on 2LNC. RT will continue to monitor.

## 2016-05-29 NOTE — Progress Notes (Signed)
PROGRESS NOTE    Gregory Eaton  [id number] DOB: 1945/09/26 DOA: 05/25/2016 PCP: Purvis Kilts, MD    Brief Narrative:  70 year old male history of esophageal cancer, COPD, type 2 diabetes, CAD status post CABG who initially presented with aspiration pneumonia. Patient left AGAINST MEDICAL ADVICE. Patient later returned emergency department after being found to have blood cultures that were positive. Patient was subsequently admitted for further workup.  Assessment & Plan:   Principal Problem:   Sepsis due to pneumonia [facility]) Active Problems:   Essential hypertension   CAP (community acquired pneumonia)   DM type 2 (diabetes mellitus, type 2) ([place])   S/P CABG x 5   COPD GOLD II    Sepsis ([place])   Healthcare-associated pneumonia  1. Sepsis secondary to aspiration pneumonia -Patient was initially continued on vancomycin and Zosyn, presently on Zosyn monotherapy -Appreciate input by speech pathology, recommendations for regular diet with thin liquids. -Patient noted to have 1 out of 2 coag negative staph species-likely contaminant.  -Presenting lactate of over 4 noted. Lactic acid has since improved -Repeat chest x-ray on 05/28/2016 demonstrated worsening pneumonia and antibiotic was transitioned to Levaquin monotherapy -Patient remains O2 dependent at this time -Leukocytosis has normalized -Patient afebrile overnight. -Patient is strep pneumo negative  -Continue to wean O2 as tolerated  -Repeat CBC in morning [  2. COPD. -Coarse breath sounds on exam -we'll continue bronchodilators as needed  3. GERD. Remains stable, patient will continue Reglan and Protonix  4. DM type 2.  -Overnight glucose remained stable and relatively well-controlled -patient is continued on diabetic diet -we'll continue signed skill insulin regimen  5. CAD s/p CABG.  -Patient is continued on  Plavix, beta blocker, statin -remains stable at this time.  6. HTN.  -Blood pressures  have remained stable, albeit slowly trending up. -Patient is continued on beta blocker, ARB -We'll resume Norvasc per home regimen  7. Hypokalemia -New problem -Potassium 2.9 this morning -Order potassium replacement -Repeat basic metabolic panel in morning  DVT prophylaxis: Heparin Code Status: Full code Family Communication: She room, family at bedside Disposition Plan: Uncertain at this time, anticipate discharge when able to wean off O2  Consultants:     Procedures:     Antimicrobials: Anti-infectives    Start     Dose/Rate Route Frequency Ordered Stop   05/28/16 1115  levofloxacin (LEVAQUIN) IVPB 750 mg     750 mg 100 mL/hr over 90 Minutes Intravenous Every 24 hours 05/28/16 1104     05/25/16 2200  vancomycin (VANCOCIN) IVPB 1000 mg/200 mL premix  Status:  Discontinued     1,000 mg 200 mL/hr over 60 Minutes Intravenous Every 12 hours 05/25/16 1543 05/26/16 1705   05/25/16 1700  piperacillin-tazobactam (ZOSYN) IVPB 3.375 g  Status:  Discontinued     3.375 g 12.5 mL/hr over 240 Minutes Intravenous Every 8 hours 05/25/16 1543 05/28/16 1057   05/25/16 1115  piperacillin-tazobactam (ZOSYN) IVPB 3.375 g     3.375 g 100 mL/hr over 30 Minutes Intravenous  Once 05/25/16 1105 05/25/16 1150   05/25/16 1115  vancomycin (VANCOCIN) IVPB 1000 mg/200 mL premix     1,000 mg 200 mL/hr over 60 Minutes Intravenous  Once 05/25/16 1105 05/25/16 1308      Subjective: Reports feeling largely unchanged from yesterday   Objective: Vitals:   05/29/16 1330 05/29/16 1500 05/29/16 1620 05/29/16 1626  BP:  (!) 149/65    Pulse:  98    Resp:  18  Temp:  99.1 F (37.3 C)    TempSrc:  Oral    SpO2: 92% 91% (!) 84% 93%  Weight:      Height:        Intake/Output Summary (Last 24 hours) at 05/29/16 1644 Last data filed at 05/29/16 0955  Gross per 24 hour  Intake              480 ml  Output                0 ml  Net              480 ml   Filed Weights   05/25/16 1045 05/25/16 1601   Weight: 72.6 kg (160 lb) 71.7 kg (158 lb)    Examination:  General exam:Awake, lying in bed, no acute distress  Respiratory system:Normal chest rise, coarse breath sounds throughout  Cardiovascular system: Regular rate, S1-S2  Gastrointestinal system:Soft, positive bowel sounds, no masses  Central nervous system:CN in 2-12 grossly intact, sensation intact  Extremities: no clubbing, perfused  Skin: Normal skin turgor, no notable skin lesions seen  Psychiatry:Mood normal, no visual hallucinations  Data Reviewed: I have personally reviewed following labs and imaging studies  CBC:  Recent Labs Lab 05/24/16 1230 05/25/16 1119 05/26/16 0631 05/27/16 0628 05/28/16 0713 05/29/16 0653  WBC 26.4* 21.4* 18.9* 13.6* 11.9* 10.3  NEUTROABS 23.5* 18.8*  --   --   --   --   HGB 15.4 13.2 12.7* 12.0* 12.6* 12.1*  HCT 46.0 39.3 38.0* 36.0* 37.4* 36.1*  MCV 93.5 92.0 91.3 90.7 90.1 90.3  PLT 250 222 244 256 307 [id number]   Basic Metabolic Panel:  Recent Labs Lab 05/24/16 1230 05/25/16 1119 05/26/16 0631 05/29/16 0653  NA 135 134* 134* 138  K 4.0 3.8 3.5 2.9*  CL 101 102 103 105  CO2 24 25 23 25   GLUCOSE 117* 114* 112* 110*  BUN 20 11 9 8   CREATININE 0.86 0.84 0.72 0.66  CALCIUM 8.9 8.6* 8.2* 8.2*   GFR: Estimated Creatinine Clearance: 81.9 mL/min (by C-G formula based on SCr of 0.66 mg/dL). Liver Function Tests:  Recent Labs Lab 05/25/16 1119  AST 14*  ALT 13*  ALKPHOS 63  BILITOT 0.9  PROT 6.8  ALBUMIN 3.1*   No results for input(s): LIPASE, AMYLASE in the last 168 hours. No results for input(s): AMMONIA in the last 168 hours. Coagulation Profile: No results for input(s): INR, PROTIME in the last 168 hours. Cardiac Enzymes: No results for input(s): CKTOTAL, CKMB, CKMBINDEX, TROPONINI in the last 168 hours. BNP (last 3 results) No results for input(s): PROBNP in the last 8760 hours. HbA1C: No results for input(s): HGBA1C in the last 72 hours. CBG:  Recent Labs Lab  05/28/16 1617 05/28/16 2050 05/29/16 0734 05/29/16 1121 05/29/16 1615  GLUCAP 105* 107* 107* 121* 105*   Lipid Profile: No results for input(s): CHOL, HDL, LDLCALC, TRIG, CHOLHDL, LDLDIRECT in the last 72 hours. Thyroid Function Tests: No results for input(s): TSH, T4TOTAL, FREET4, T3FREE, THYROIDAB in the last 72 hours. Anemia Panel: No results for input(s): VITAMINB12, FOLATE, FERRITIN, TIBC, IRON, RETICCTPCT in the last 72 hours. Sepsis Labs:  Recent Labs Lab 05/24/16 1231 05/24/16 1534 05/25/16 1129 05/25/16 1434  LATICACIDVEN 4.1* 2.9* 1.80 0.99    Recent Results (from the past 240 hour(s))  Culture, blood (Routine X 2) w Reflex to ID Panel     Status: Abnormal   Collection Time: 05/24/16 12:30 PM  Result Value  Ref Range Status   Specimen Description BLOOD BLOOD LEFT ARM  Final   Special Requests BOTTLES DRAWN AEROBIC AND ANAEROBIC [facility] EACH  Final   Culture  Setup Time   Final    Anaerobic bottle recovered Gram Positive Cocci Gram Stain Report Called to,Read Back By and Verified With: White,M at 845am by H Flynt  05/25/16 CRITICAL RESULT CALLED TO, READ BACK BY AND VERIFIED WITH: C. KNIGHT, RN ([facility]) AT [place] ON 05/25/16 BY C. JESSUP, MLT.    Culture (A)  Final    STAPHYLOCOCCUS SPECIES (COAGULASE NEGATIVE) THE SIGNIFICANCE OF ISOLATING THIS ORGANISM FROM A SINGLE SET OF BLOOD CULTURES WHEN MULTIPLE SETS ARE DRAWN IS UNCERTAIN. PLEASE NOTIFY THE MICROBIOLOGY DEPARTMENT WITHIN ONE WEEK IF SPECIATION AND SENSITIVITIES ARE REQUIRED. Performed at [facility]    Report Status 05/27/2016 FINAL  Final  Culture, blood (Routine X 2) w Reflex to ID Panel     Status: None   Collection Time: 05/24/16 12:30 PM  Result Value Ref Range Status   Specimen Description BLOOD RIGHT ANTECUBITAL  Final   Special Requests BOTTLES DRAWN AEROBIC AND ANAEROBIC [place]  Final   Culture NO GROWTH 5 DAYS  Final   Report Status 05/29/2016 FINAL  Final  Blood Culture ID Panel (Reflexed)      Status: Abnormal   Collection Time: 05/24/16 12:30 PM  Result Value Ref Range Status   Enterococcus species NOT DETECTED NOT DETECTED Final   Listeria monocytogenes NOT DETECTED NOT DETECTED Final   Staphylococcus species DETECTED (A) NOT DETECTED Final    Comment: CRITICAL RESULT CALLED TO, READ BACK BY AND VERIFIED WITH: C. KNIGHT, RN ([facility]) AT [place] ON 05/25/16 BY C. JESSUP, MLT.    Staphylococcus aureus NOT DETECTED NOT DETECTED Final   Methicillin resistance NOT DETECTED NOT DETECTED Final   Streptococcus species NOT DETECTED NOT DETECTED Final   Streptococcus agalactiae NOT DETECTED NOT DETECTED Final   Streptococcus pneumoniae NOT DETECTED NOT DETECTED Final   Streptococcus pyogenes NOT DETECTED NOT DETECTED Final   Acinetobacter baumannii NOT DETECTED NOT DETECTED Final   Enterobacteriaceae species NOT DETECTED NOT DETECTED Final   Enterobacter cloacae complex NOT DETECTED NOT DETECTED Final   Escherichia coli NOT DETECTED NOT DETECTED Final   Klebsiella oxytoca NOT DETECTED NOT DETECTED Final   Klebsiella pneumoniae NOT DETECTED NOT DETECTED Final   Proteus species NOT DETECTED NOT DETECTED Final   Serratia marcescens NOT DETECTED NOT DETECTED Final   Haemophilus influenzae NOT DETECTED NOT DETECTED Final   Neisseria meningitidis NOT DETECTED NOT DETECTED Final   Pseudomonas aeruginosa NOT DETECTED NOT DETECTED Final   Candida albicans NOT DETECTED NOT DETECTED Final   Candida glabrata NOT DETECTED NOT DETECTED Final   Candida krusei NOT DETECTED NOT DETECTED Final   Candida parapsilosis NOT DETECTED NOT DETECTED Final   Candida tropicalis NOT DETECTED NOT DETECTED Final    Comment: Performed at [facility]  Culture, expectorated sputum-assessment     Status: None   Collection Time: 05/28/16  9:00 AM  Result Value Ref Range Status   Specimen Description EXPECTORATED SPUTUM  Final   Special Requests NONE  Final   Sputum evaluation   Final    THIS SPECIMEN IS  ACCEPTABLE. RESPIRATORY CULTURE REPORT TO FOLLOW. Performed at [facility]    Report Status 05/28/2016 FINAL  Final  Culture, respiratory (NON-Expectorated)     Status: None (Preliminary result)   Collection Time: 05/28/16  9:00 AM  Result Value Ref  Range Status   Specimen Description EXPECTORATED SPUTUM  Final   Special Requests NONE  Final   Gram Stain   Final    MODERATE WBC PRESENT, PREDOMINANTLY PMN FEW SQUAMOUS EPITHELIAL CELLS PRESENT MODERATE GRAM POSITIVE COCCI IN CLUSTERS FEW GRAM POSITIVE RODS FEW GRAM NEGATIVE RODS FEW GRAM POSITIVE COCCI IN PAIRS FEW GRAM NEGATIVE COCCOBACILLI    Culture   Final    CULTURE REINCUBATED FOR BETTER GROWTH Performed at [facility]    Report Status PENDING  Incomplete     Radiology Studies: Dg Chest Port 1 View  Result Date: 05/28/2016 CLINICAL DATA:  Shortness of breath today. Recent diagnosis cysts of middle lobe pneumonia. History of COPD, coronary artery disease and CABG, former smoker. EXAM: PORTABLE CHEST 1 VIEW COMPARISON:  PA and lateral chest x-ray of May 24, 2016. FINDINGS: The left lung is mildly hyperinflated and clear. On the right progressive increase in interstitial density has occurred at the lung base. There is obscuration of portions of the hemidiaphragm and right heart border. The heart is top-normal in size. The pulmonary vascularity is not engorged. There are post CABG changes. There is calcification in the wall of the aortic arch. IMPRESSION: Worsening of right mid and lower lobe density consistent with progressive pneumonia. Underlying COPD. No CHF. Aortic atherosclerosis. Electronically Signed   By: David  Martinique M.D.   On: 05/28/2016 09:05    Scheduled Meds: . albuterol  2.5 mg Nebulization Q6H  . ALPRAZolam  0.5 mg Oral QHS  . aspirin EC  81 mg Oral Daily  . clopidogrel  75 mg Oral Daily  . heparin  5,000 Units Subcutaneous Q8H  . insulin aspart  0-9 Units Subcutaneous TID WC  . latanoprost   1 drop Both Eyes QHS  . levofloxacin (LEVAQUIN) IV  750 mg Intravenous Q24H  . losartan  25 mg Oral Daily  . metoCLOPramide  5 mg Oral TID AC  . metoprolol succinate  50 mg Oral Daily  . mometasone-formoterol  2 puff Inhalation BID  . montelukast  10 mg Oral Daily  . pantoprazole  40 mg Oral BID  . polyethylene glycol  17 g Oral BID  . potassium chloride  40 mEq Oral BID  . rosuvastatin  10 mg Oral Daily  . sucralfate  1 g Oral TID WC & HS  . timolol  1 drop Both Eyes Daily  . tiotropium  18 mcg Inhalation Daily   Continuous Infusions:   LOS: 4 days   Gregory Eaton, Orpah Melter, MD [facility] Pager [phone]  If 7PM-7AM, please contact night-coverage [email or web address] Password [email or web address] 05/29/2016, 4:44 PM

## 2016-05-30 DIAGNOSIS — E1101 Type 2 diabetes mellitus with hyperosmolarity with coma: Secondary | ICD-10-CM

## 2016-05-30 LAB — BASIC METABOLIC PANEL
ANION GAP: 10 (ref 5–15)
BUN: 10 mg/dL (ref 6–20)
CHLORIDE: 104 mmol/L (ref 101–111)
CO2: 22 mmol/L (ref 22–32)
Calcium: 8.6 mg/dL — ABNORMAL LOW (ref 8.9–10.3)
Creatinine, Ser: 0.72 mg/dL (ref 0.61–1.24)
GFR calc non Af Amer: >60 mL/min (ref >60)
Glucose, Bld: 108 mg/dL — ABNORMAL HIGH (ref 65–99)
POTASSIUM: 3.8 mmol/L (ref 3.5–5.1)
SODIUM: 136 mmol/L (ref 135–145)

## 2016-05-30 LAB — CULTURE, RESPIRATORY: CULTURE: NORMAL

## 2016-05-30 LAB — CBC
HEMATOCRIT: 37.4 % — AB (ref 39.0–52.0)
HEMOGLOBIN: 12.6 g/dL — AB (ref 13.0–17.0)
MCH: 30.5 pg (ref 26.0–34.0)
MCHC: 33.7 g/dL (ref 30.0–36.0)
MCV: 90.6 fL (ref 78.0–100.0)
PLATELETS: 405 10*3/uL — AB (ref 150–400)
RBC: 4.13 MIL/uL — AB (ref 4.22–5.81)
RDW: 13.5 % (ref 11.5–15.5)
WBC: 11.7 10*3/uL — AB (ref 4.0–10.5)

## 2016-05-30 LAB — GLUCOSE, CAPILLARY
GLUCOSE-CAPILLARY: 116 mg/dL — AB (ref 65–99)
GLUCOSE-CAPILLARY: 117 mg/dL — AB (ref 65–99)
Glucose-Capillary: 107 mg/dL — ABNORMAL HIGH (ref 65–99)
Glucose-Capillary: 141 mg/dL — ABNORMAL HIGH (ref 65–99)

## 2016-05-30 NOTE — Progress Notes (Addendum)
PROGRESS NOTE    Gregory Eaton  [id number] DOB: August 13, 1945 DOA: 05/25/2016 PCP: Purvis Kilts, MD    Brief Narrative:  70 year old male history of esophageal cancer, COPD, type 2 diabetes, CAD status post CABG who initially presented with aspiration pneumonia. Patient left AGAINST MEDICAL ADVICE. Patient later returned emergency department after being found to have blood cultures that were positive. Patient was subsequently admitted for further workup.  Assessment & Plan:   Principal Problem:   Sepsis due to pneumonia [facility]) Active Problems:   Essential hypertension   CAP (community acquired pneumonia)   DM type 2 (diabetes mellitus, type 2) ([place])   S/P CABG x 5   COPD GOLD II    Sepsis ([place])   Healthcare-associated pneumonia  1. Sepsis secondary to aspiration pneumonia -Patient was initially continued on vancomycin and Zosyn, presently on Zosyn monotherapy -Appreciate input by speech pathology, recommendations for regular diet with thin liquids. -Patient noted to have 1 out of 2 coag negative staph species-likely contaminant.  -Presenting lactate of over 4 noted. Lactic acid has since improved -Repeat chest x-ray on 05/28/2016 demonstrated worsening pneumonia and antibiotic was transitioned to Levaquin monotherapy -Patient remains O2 dependent at this time, however is improving -Leukocytosis slightly higher today to 11.7 -Patient is strep pneumo negative  -Patient has reported clinical improvement today -We'll repeat CBC in the morning -We'll repeat chest x-ray morning for interval change  2. COPD. -Lungs are clear this morning -Continue to wean O2 as tolerated  3. GERD. -We'll continue patient on Reglan and narcotics as tolerated -Stable at present  4. DM type 2.  -Current glucose trends reviewed -Patient is continued on a carb modified diet -For now, continue signed scale insulin  5. CAD s/p CABG.  -Patient is continued on  Plavix, beta blocker,  statin -Remained stable this time with no anginal symptoms  6. HTN.  -Patient is continued on beta blocker, ARB, Norvasc -Blood pressure reviewed. Most recent blood pressure 149/65 -Continue to titrate blood pressure medications as needed  7. Hypokalemia -New problem -Potassium 2.9 this morning -Order potassium replacement -Repeat basic metabolic panel in morning  DVT prophylaxis: Heparin subcutaneous Code Status: Full code Family Communication: She room, family at bedside Disposition Plan: Anticipate discharge home when weaned off of oxygen  Consultants:     Procedures:     Antimicrobials: Anti-infectives    Start     Dose/Rate Route Frequency Ordered Stop   05/28/16 1115  levofloxacin (LEVAQUIN) IVPB 750 mg     750 mg 100 mL/hr over 90 Minutes Intravenous Every 24 hours 05/28/16 1104     05/25/16 2200  vancomycin (VANCOCIN) IVPB 1000 mg/200 mL premix  Status:  Discontinued     1,000 mg 200 mL/hr over 60 Minutes Intravenous Every 12 hours 05/25/16 1543 05/26/16 1705   05/25/16 1700  piperacillin-tazobactam (ZOSYN) IVPB 3.375 g  Status:  Discontinued     3.375 g 12.5 mL/hr over 240 Minutes Intravenous Every 8 hours 05/25/16 1543 05/28/16 1057   05/25/16 1115  piperacillin-tazobactam (ZOSYN) IVPB 3.375 g     3.375 g 100 mL/hr over 30 Minutes Intravenous  Once 05/25/16 1105 05/25/16 1150   05/25/16 1115  vancomycin (VANCOCIN) IVPB 1000 mg/200 mL premix     1,000 mg 200 mL/hr over 60 Minutes Intravenous  Once 05/25/16 1105 05/25/16 1308      Subjective: Patient states feeling better today  Objective: Vitals:   05/30/16 0252 05/30/16 1308 05/30/16 1403 05/30/16 1413  BP:  Marland Kitchen)  144/64 (!) 144/59   Pulse:  89 90   Resp:   (!) 89   Temp:   97.4 F (36.3 C)   TempSrc:   Oral   SpO2: 91% 93% 95% 91%  Weight:      Height:        Intake/Output Summary (Last 24 hours) at 05/30/16 1622 Last data filed at 05/30/16 0900  Gross per 24 hour  Intake              480  ml  Output                0 ml  Net              480 ml   Filed Weights   05/25/16 1045 05/25/16 1601  Weight: 72.6 kg (160 lb) 71.7 kg (158 lb)    Examination:  General exam:Sitting in chair, conversant, no acute distress Respiratory system: Normal respiratory effort, clear breaths, no end expiratory wheezing Cardiovascular system: Regular rhythm, S1-S2 Gastrointestinal system: Positive bowel sounds, nondistended Central nervous system: No tremors, no seizures Extremities: No cyanosis, no joint deformities Skin: No pallor, no rashes Psychiatry: Affect normal, no auditory hallucinations  Data Reviewed: I have personally reviewed following labs and imaging studies  CBC:  Recent Labs Lab 05/24/16 1230 05/25/16 1119 05/26/16 0631 05/27/16 0628 05/28/16 0713 05/29/16 0653 05/30/16 0641  WBC 26.4* 21.4* 18.9* 13.6* 11.9* 10.3 11.7*  NEUTROABS 23.5* 18.8*  --   --   --   --   --   HGB 15.4 13.2 12.7* 12.0* 12.6* 12.1* 12.6*  HCT 46.0 39.3 38.0* 36.0* 37.4* 36.1* 37.4*  MCV 93.5 92.0 91.3 90.7 90.1 90.3 90.6  PLT 250 222 244 256 307 340 [id number]*   Basic Metabolic Panel:  Recent Labs Lab 05/24/16 1230 05/25/16 1119 05/26/16 0631 05/29/16 0653 05/30/16 0641  NA 135 134* 134* 138 136  K 4.0 3.8 3.5 2.9* 3.8  CL 101 102 103 105 104  CO2 24 25 23 25 22   GLUCOSE 117* 114* 112* 110* 108*  BUN 20 11 9 8 10   CREATININE 0.86 0.84 0.72 0.66 0.72  CALCIUM 8.9 8.6* 8.2* 8.2* 8.6*   GFR: Estimated Creatinine Clearance: 81.9 mL/min (by C-G formula based on SCr of 0.72 mg/dL). Liver Function Tests:  Recent Labs Lab 05/25/16 1119  AST 14*  ALT 13*  ALKPHOS 63  BILITOT 0.9  PROT 6.8  ALBUMIN 3.1*   No results for input(s): LIPASE, AMYLASE in the last 168 hours. No results for input(s): AMMONIA in the last 168 hours. Coagulation Profile: No results for input(s): INR, PROTIME in the last 168 hours. Cardiac Enzymes: No results for input(s): CKTOTAL, CKMB, CKMBINDEX,  TROPONINI in the last 168 hours. BNP (last 3 results) No results for input(s): PROBNP in the last 8760 hours. HbA1C: No results for input(s): HGBA1C in the last 72 hours. CBG:  Recent Labs Lab 05/29/16 1121 05/29/16 1615 05/29/16 2210 05/30/16 0719 05/30/16 1130  GLUCAP 121* 105* 121* 107* 141*   Lipid Profile: No results for input(s): CHOL, HDL, LDLCALC, TRIG, CHOLHDL, LDLDIRECT in the last 72 hours. Thyroid Function Tests: No results for input(s): TSH, T4TOTAL, FREET4, T3FREE, THYROIDAB in the last 72 hours. Anemia Panel: No results for input(s): VITAMINB12, FOLATE, FERRITIN, TIBC, IRON, RETICCTPCT in the last 72 hours. Sepsis Labs:  Recent Labs Lab 05/24/16 1231 05/24/16 1534 05/25/16 1129 05/25/16 1434  LATICACIDVEN 4.1* 2.9* 1.80 0.99    Recent Results (from the past  240 hour(s))  Culture, blood (Routine X 2) w Reflex to ID Panel     Status: Abnormal   Collection Time: 05/24/16 12:30 PM  Result Value Ref Range Status   Specimen Description BLOOD BLOOD LEFT ARM  Final   Special Requests BOTTLES DRAWN AEROBIC AND ANAEROBIC [facility] EACH  Final   Culture  Setup Time   Final    Anaerobic bottle recovered Gram Positive Cocci Gram Stain Report Called to,Read Back By and Verified With: White,M at 845am by H Flynt  05/25/16 CRITICAL RESULT CALLED TO, READ BACK BY AND VERIFIED WITH: C. KNIGHT, RN ([facility]) AT [place] ON 05/25/16 BY C. JESSUP, MLT.    Culture (A)  Final    STAPHYLOCOCCUS SPECIES (COAGULASE NEGATIVE) THE SIGNIFICANCE OF ISOLATING THIS ORGANISM FROM A SINGLE SET OF BLOOD CULTURES WHEN MULTIPLE SETS ARE DRAWN IS UNCERTAIN. PLEASE NOTIFY THE MICROBIOLOGY DEPARTMENT WITHIN ONE WEEK IF SPECIATION AND SENSITIVITIES ARE REQUIRED. Performed at [facility]    Report Status 05/27/2016 FINAL  Final  Culture, blood (Routine X 2) w Reflex to ID Panel     Status: None   Collection Time: 05/24/16 12:30 PM  Result Value Ref Range Status   Specimen Description BLOOD RIGHT  ANTECUBITAL  Final   Special Requests BOTTLES DRAWN AEROBIC AND ANAEROBIC [place]  Final   Culture NO GROWTH 5 DAYS  Final   Report Status 05/29/2016 FINAL  Final  Blood Culture ID Panel (Reflexed)     Status: Abnormal   Collection Time: 05/24/16 12:30 PM  Result Value Ref Range Status   Enterococcus species NOT DETECTED NOT DETECTED Final   Listeria monocytogenes NOT DETECTED NOT DETECTED Final   Staphylococcus species DETECTED (A) NOT DETECTED Final    Comment: CRITICAL RESULT CALLED TO, READ BACK BY AND VERIFIED WITH: C. KNIGHT, RN ([facility]) AT [place] ON 05/25/16 BY C. JESSUP, MLT.    Staphylococcus aureus NOT DETECTED NOT DETECTED Final   Methicillin resistance NOT DETECTED NOT DETECTED Final   Streptococcus species NOT DETECTED NOT DETECTED Final   Streptococcus agalactiae NOT DETECTED NOT DETECTED Final   Streptococcus pneumoniae NOT DETECTED NOT DETECTED Final   Streptococcus pyogenes NOT DETECTED NOT DETECTED Final   Acinetobacter baumannii NOT DETECTED NOT DETECTED Final   Enterobacteriaceae species NOT DETECTED NOT DETECTED Final   Enterobacter cloacae complex NOT DETECTED NOT DETECTED Final   Escherichia coli NOT DETECTED NOT DETECTED Final   Klebsiella oxytoca NOT DETECTED NOT DETECTED Final   Klebsiella pneumoniae NOT DETECTED NOT DETECTED Final   Proteus species NOT DETECTED NOT DETECTED Final   Serratia marcescens NOT DETECTED NOT DETECTED Final   Haemophilus influenzae NOT DETECTED NOT DETECTED Final   Neisseria meningitidis NOT DETECTED NOT DETECTED Final   Pseudomonas aeruginosa NOT DETECTED NOT DETECTED Final   Candida albicans NOT DETECTED NOT DETECTED Final   Candida glabrata NOT DETECTED NOT DETECTED Final   Candida krusei NOT DETECTED NOT DETECTED Final   Candida parapsilosis NOT DETECTED NOT DETECTED Final   Candida tropicalis NOT DETECTED NOT DETECTED Final    Comment: Performed at [facility]  Culture, expectorated sputum-assessment     Status: None    Collection Time: 05/28/16  9:00 AM  Result Value Ref Range Status   Specimen Description EXPECTORATED SPUTUM  Final   Special Requests NONE  Final   Sputum evaluation   Final    THIS SPECIMEN IS ACCEPTABLE. RESPIRATORY CULTURE REPORT TO FOLLOW. Performed at [facility]    Report  Status 05/28/2016 FINAL  Final  Culture, respiratory (NON-Expectorated)     Status: None   Collection Time: 05/28/16  9:00 AM  Result Value Ref Range Status   Specimen Description EXPECTORATED SPUTUM  Final   Special Requests NONE  Final   Gram Stain   Final    MODERATE WBC PRESENT, PREDOMINANTLY PMN FEW SQUAMOUS EPITHELIAL CELLS PRESENT MODERATE GRAM POSITIVE COCCI IN CLUSTERS FEW GRAM POSITIVE RODS FEW GRAM NEGATIVE RODS FEW GRAM POSITIVE COCCI IN PAIRS FEW GRAM NEGATIVE COCCOBACILLI    Culture   Final    Consistent with normal respiratory flora. Performed at [facility]    Report Status 05/30/2016 FINAL  Final     Radiology Studies: No results found.  Scheduled Meds: . albuterol  2.5 mg Nebulization Q6H  . ALPRAZolam  0.5 mg Oral QHS  . amLODipine  5 mg Oral Daily  . aspirin EC  81 mg Oral Daily  . clopidogrel  75 mg Oral Daily  . heparin  5,000 Units Subcutaneous Q8H  . insulin aspart  0-9 Units Subcutaneous TID WC  . latanoprost  1 drop Both Eyes QHS  . levofloxacin (LEVAQUIN) IV  750 mg Intravenous Q24H  . losartan  25 mg Oral Daily  . metoCLOPramide  5 mg Oral TID AC  . metoprolol succinate  50 mg Oral Daily  . mometasone-formoterol  2 puff Inhalation BID  . montelukast  10 mg Oral Daily  . pantoprazole  40 mg Oral BID  . polyethylene glycol  17 g Oral BID  . rosuvastatin  10 mg Oral Daily  . sucralfate  1 g Oral TID WC & HS  . timolol  1 drop Both Eyes Daily  . tiotropium  18 mcg Inhalation Daily   Continuous Infusions:   LOS: 5 days   Metzli Pollick, Orpah Melter, MD [facility] Pager [phone]  If 7PM-7AM, please contact  night-coverage [email or web address] Password [email or web address] 05/30/2016, 4:22 PM

## 2016-05-30 NOTE — Progress Notes (Signed)
Pt ambulated to the restroom and back on RA. SpO2 just after returning to bed was 91%. Pt states "he didn't experience any SOB, and can tell he's getting better". Pt back on 2LNC, scheduled neb tx given, no obvious respiratory distress noted. RT will continue to monitor.

## 2016-05-31 ENCOUNTER — Inpatient Hospital Stay (HOSPITAL_COMMUNITY): Payer: Medicare Other

## 2016-05-31 LAB — CBC
HCT: 38.9 % — ABNORMAL LOW (ref 39.0–52.0)
HEMOGLOBIN: 13.2 g/dL (ref 13.0–17.0)
MCH: 30.8 pg (ref 26.0–34.0)
MCHC: 33.9 g/dL (ref 30.0–36.0)
MCV: 90.7 fL (ref 78.0–100.0)
PLATELETS: 452 10*3/uL — AB (ref 150–400)
RBC: 4.29 MIL/uL (ref 4.22–5.81)
RDW: 13.6 % (ref 11.5–15.5)
WBC: 15.1 10*3/uL — ABNORMAL HIGH (ref 4.0–10.5)

## 2016-05-31 LAB — GLUCOSE, CAPILLARY
GLUCOSE-CAPILLARY: 115 mg/dL — AB (ref 65–99)
GLUCOSE-CAPILLARY: 120 mg/dL — AB (ref 65–99)
Glucose-Capillary: 149 mg/dL — ABNORMAL HIGH (ref 65–99)
Glucose-Capillary: 131 mg/dL — ABNORMAL HIGH (ref 65–99)

## 2016-05-31 LAB — BASIC METABOLIC PANEL
Anion gap: 8 (ref 5–15)
BUN: 11 mg/dL (ref 6–20)
CHLORIDE: 103 mmol/L (ref 101–111)
CO2: 22 mmol/L (ref 22–32)
CREATININE: 0.81 mg/dL (ref 0.61–1.24)
Calcium: 8.7 mg/dL — ABNORMAL LOW (ref 8.9–10.3)
GFR calc Af Amer: >60 mL/min (ref >60)
GFR calc non Af Amer: >60 mL/min (ref >60)
GLUCOSE: 160 mg/dL — AB (ref 65–99)
POTASSIUM: 3.9 mmol/L (ref 3.5–5.1)
SODIUM: 133 mmol/L — AB (ref 135–145)

## 2016-05-31 LAB — MRSA PCR SCREENING: MRSA by PCR: NEGATIVE

## 2016-05-31 MED ORDER — ALBUTEROL SULFATE (2.5 MG/3ML) 0.083% IN NEBU
2.5000 mg | INHALATION_SOLUTION | RESPIRATORY_TRACT | Status: DC | PRN
  Filled 2016-05-31: qty 3

## 2016-05-31 MED ORDER — VANCOMYCIN HCL IN DEXTROSE 1-5 GM/200ML-% IV SOLN
1000.0000 mg | Freq: Two times a day (BID) | INTRAVENOUS | Status: DC
  Administered 2016-06-01 (×2): 1000 mg via INTRAVENOUS
  Filled 2016-05-31 (×3): qty 200

## 2016-05-31 MED ORDER — VANCOMYCIN HCL 10 G IV SOLR
1500.0000 mg | Freq: Once | INTRAVENOUS | Status: AC
  Administered 2016-05-31: 1500 mg via INTRAVENOUS
  Filled 2016-05-31: qty 1500

## 2016-05-31 MED ORDER — ALBUTEROL SULFATE (2.5 MG/3ML) 0.083% IN NEBU
2.5000 mg | INHALATION_SOLUTION | Freq: Two times a day (BID) | RESPIRATORY_TRACT | Status: DC
  Administered 2016-05-31 – 2016-06-03 (×6): 2.5 mg via RESPIRATORY_TRACT
  Filled 2016-05-31 (×6): qty 3

## 2016-05-31 MED ORDER — ALBUTEROL SULFATE (2.5 MG/3ML) 0.083% IN NEBU
2.5000 mg | INHALATION_SOLUTION | Freq: Three times a day (TID) | RESPIRATORY_TRACT | Status: DC
  Administered 2016-05-31: 2.5 mg via RESPIRATORY_TRACT
  Filled 2016-05-31: qty 3

## 2016-05-31 MED ORDER — PIPERACILLIN-TAZOBACTAM 3.375 G IVPB
3.3750 g | Freq: Three times a day (TID) | INTRAVENOUS | Status: DC
  Administered 2016-05-31 – 2016-06-03 (×9): 3.375 g via INTRAVENOUS
  Filled 2016-05-31 (×9): qty 50

## 2016-05-31 NOTE — Progress Notes (Signed)
Pharmacy Antibiotic Note  Gregory Eaton is a 70 y.o. male admitted on 05/25/2016 with Aspiration Pneumonia.  Plan to change antibiotics back to vanc and zosyn  Plan: Vanc 1500 mg IV x 1 then 1gm IV q12 hours Zosyn 3.375 gm IV q8 hours F/U renal function, cultures and clinical course  Height: 5\' 8"  (172.7 cm) Weight: 158 lb (71.7 kg) IBW/kg (Calculated) : 68.4  Temp (24hrs), Avg:98.3 F (36.8 C), Min:97.8 F (36.6 C), Max:98.8 F (37.1 C)   Recent Labs Lab 05/24/16 1534  05/25/16 1119 05/25/16 1129 05/25/16 1434 05/26/16 0631 05/27/16 0628 05/28/16 0713 05/29/16 0653 05/30/16 0641 05/31/16 0921  WBC  --   < > 21.4*  --   --  18.9* 13.6* 11.9* 10.3 11.7* 15.1*  CREATININE  --   --  0.84  --   --  0.72  --   --  0.66 0.72 0.81  LATICACIDVEN 2.9*  --   --  1.80 0.99  --   --   --   --   --   --   < > = values in this interval not displayed.  Estimated Creatinine Clearance: 80.9 mL/min (by C-G formula based on SCr of 0.81 mg/dL).    Allergies  Allergen Reactions  . Altace [Ramipril] Cough  . Prednisone Other (See Comments)    "throat broke out"     Antimicrobials this admission: Levaquin  11/10>>11/13 Vancomycin 11/7>> 11/9, restart 11/13 Zosyn 11/7 >> 11/10, restart 11/13 Ceftriaxone 11/6 x 1 dose in ED  Dose adjustments this admission: n/a  Microbiology results: 11/6 BCx: 1/2 CoNS 11/13 sputum:  Normal flora  Thank you for allowing pharmacy to be a part of this patient's care.  Excell Seltzer, PharmD Clinical Pharmacist 05/31/2016 2:42 PM

## 2016-05-31 NOTE — Progress Notes (Signed)
PROGRESS NOTE    Gregory Eaton  [id number] DOB: 1945/10/11 DOA: 05/25/2016 PCP: Purvis Kilts, MD    Brief Narrative:  70 year old male history of esophageal cancer, COPD, type 2 diabetes, CAD status post CABG who initially presented with aspiration pneumonia. Patient left AGAINST MEDICAL ADVICE. Patient later returned emergency department after being found to have blood cultures that were positive. Patient was subsequently admitted for further workup.  Assessment & Plan:   Principal Problem:   Sepsis due to pneumonia [facility]) Active Problems:   Essential hypertension   CAP (community acquired pneumonia)   DM type 2 (diabetes mellitus, type 2) ([place])   S/P CABG x 5   COPD GOLD II    Sepsis ([place])   Healthcare-associated pneumonia  1. Sepsis secondary to aspiration pneumonia -Patient was initially continued on vancomycin and Zosyn, later Zosyn monotherapy -Appreciate input by speech pathology, recommendations for regular diet with thin liquids. -Patient noted to have 1 out of 2 coag negative staph species-likely contaminant.  -Presenting lactate of over 4 noted. Lactic acid has since improved -Repeat chest x-ray on 05/28/2016 demonstrated worsening pneumonia and antibiotic was transitioned to Levaquin monotherapy -Patient remains O2 dependent at this time -Patient is strep pneumo negative  -Leukocytosis is worsening to 15k today -Follow up cxr personally reviewed and shows worsened pneumonia -Will consult Pulmonary for further recommendations -Will continue vanc and zosyn -have ordered MRSA screen - negative -repeat cbc in am  2. COPD. -Lungs remain clear this morning -Continue to wean O2 as tolerated  3. GERD. -We'll continue patient on Reglan and narcotics as tolerated -Stable at present  4. DM type 2.  -Overnight glucose trends noted -glucose remains stable -Continue SSI coverage  5. CAD s/p CABG.  -Asymptomatic at this time -Continue current  regimen  6. HTN.  -Patient remains on beta blocker, ARB, Norvasc -Blood pressure reviewed. Most recent blood pressure 118/60, stable  7. Hypokalemia -corrected -Repeat bmet in am  DVT prophylaxis: Heparin subcutaneous Code Status: Full code Family Communication: She room, family at bedside Disposition Plan: Anticipate discharge home when weaned off of oxygen  Consultants:   Pulmonary  Procedures:     Antimicrobials: Anti-infectives    Start     Dose/Rate Route Frequency Ordered Stop   06/01/16 0000  vancomycin (VANCOCIN) IVPB 1000 mg/200 mL premix     1,000 mg 200 mL/hr over 60 Minutes Intravenous Every 12 hours 05/31/16 1157     05/31/16 1200  vancomycin (VANCOCIN) 1,500 mg in sodium chloride 0.9 % 500 mL IVPB     1,500 mg 250 mL/hr over 120 Minutes Intravenous  Once 05/31/16 1157 05/31/16 1424   05/31/16 1200  piperacillin-tazobactam (ZOSYN) IVPB 3.375 g     3.375 g 12.5 mL/hr over 240 Minutes Intravenous Every 8 hours 05/31/16 1157     05/28/16 1115  levofloxacin (LEVAQUIN) IVPB 750 mg  Status:  Discontinued     750 mg 100 mL/hr over 90 Minutes Intravenous Every 24 hours 05/28/16 1104 05/31/16 1145   05/25/16 2200  vancomycin (VANCOCIN) IVPB 1000 mg/200 mL premix  Status:  Discontinued     1,000 mg 200 mL/hr over 60 Minutes Intravenous Every 12 hours 05/25/16 1543 05/26/16 1705   05/25/16 1700  piperacillin-tazobactam (ZOSYN) IVPB 3.375 g  Status:  Discontinued     3.375 g 12.5 mL/hr over 240 Minutes Intravenous Every 8 hours 05/25/16 1543 05/28/16 1057   05/25/16 1115  piperacillin-tazobactam (ZOSYN) IVPB 3.375 g     3.375 g 100  mL/hr over 30 Minutes Intravenous  Once 05/25/16 1105 05/25/16 1150   05/25/16 1115  vancomycin (VANCOCIN) IVPB 1000 mg/200 mL premix     1,000 mg 200 mL/hr over 60 Minutes Intravenous  Once 05/25/16 1105 05/25/16 1308      Subjective: Still feels generally ill this AM  Objective: Vitals:   05/31/16 0800 05/31/16 0808 05/31/16  0811 05/31/16 1409  BP:    118/60  Pulse:    93  Resp:    20  Temp:    98.2 F (36.8 C)  TempSrc:    Oral  SpO2: (!) 88% (!) 88% (!) 88% 90%  Weight:      Height:        Intake/Output Summary (Last 24 hours) at 05/31/16 1715 Last data filed at 05/31/16 1500  Gross per 24 hour  Intake             1020 ml  Output                0 ml  Net             1020 ml   Filed Weights   05/25/16 1045 05/25/16 1601  Weight: 72.6 kg (160 lb) 71.7 kg (158 lb)    Examination:  General exam:Awake, no acute distress, sitting in chair Respiratory system: Normal chest rise, no audible wheezing, crackles auscultated Cardiovascular system: Regular rate, S1-S2 Gastrointestinal system: No masses, no organomegaly Central nervous system: CN II through XII grossly intact, sensation intact Extremities: No clubbing, perfused Skin: No notable skin lesions seen, normal skin turgor Psychiatry: Mood normal, no visual hallucinations  Data Reviewed: I have personally reviewed following labs and imaging studies  CBC:  Recent Labs Lab 05/25/16 1119  05/27/16 0628 05/28/16 0713 05/29/16 0653 05/30/16 0641 05/31/16 0921  WBC 21.4*  < > 13.6* 11.9* 10.3 11.7* 15.1*  NEUTROABS 18.8*  --   --   --   --   --   --   HGB 13.2  < > 12.0* 12.6* 12.1* 12.6* 13.2  HCT 39.3  < > 36.0* 37.4* 36.1* 37.4* 38.9*  MCV 92.0  < > 90.7 90.1 90.3 90.6 90.7  PLT 222  < > 256 307 340 405* 452*  < > = values in this interval not displayed. Basic Metabolic Panel:  Recent Labs Lab 05/25/16 1119 05/26/16 0631 05/29/16 0653 05/30/16 0641 05/31/16 0921  NA 134* 134* 138 136 133*  K 3.8 3.5 2.9* 3.8 3.9  CL 102 103 105 104 103  CO2 25 23 25 22 22   GLUCOSE 114* 112* 110* 108* 160*  BUN 11 9 8 10 11   CREATININE 0.84 0.72 0.66 0.72 0.81  CALCIUM 8.6* 8.2* 8.2* 8.6* 8.7*   GFR: Estimated Creatinine Clearance: 80.9 mL/min (by C-G formula based on SCr of 0.81 mg/dL). Liver Function Tests:  Recent Labs Lab  05/25/16 1119  AST 14*  ALT 13*  ALKPHOS 63  BILITOT 0.9  PROT 6.8  ALBUMIN 3.1*   No results for input(s): LIPASE, AMYLASE in the last 168 hours. No results for input(s): AMMONIA in the last 168 hours. Coagulation Profile: No results for input(s): INR, PROTIME in the last 168 hours. Cardiac Enzymes: No results for input(s): CKTOTAL, CKMB, CKMBINDEX, TROPONINI in the last 168 hours. BNP (last 3 results) No results for input(s): PROBNP in the last 8760 hours. HbA1C: No results for input(s): HGBA1C in the last 72 hours. CBG:  Recent Labs Lab 05/30/16 1627 05/30/16 2135 05/31/16 0747  05/31/16 1137 05/31/16 1618  GLUCAP 116* 117* 115* 149* 120*   Lipid Profile: No results for input(s): CHOL, HDL, LDLCALC, TRIG, CHOLHDL, LDLDIRECT in the last 72 hours. Thyroid Function Tests: No results for input(s): TSH, T4TOTAL, FREET4, T3FREE, THYROIDAB in the last 72 hours. Anemia Panel: No results for input(s): VITAMINB12, FOLATE, FERRITIN, TIBC, IRON, RETICCTPCT in the last 72 hours. Sepsis Labs:  Recent Labs Lab 05/25/16 1129 05/25/16 1434  LATICACIDVEN 1.80 0.99    Recent Results (from the past 240 hour(s))  Culture, blood (Routine X 2) w Reflex to ID Panel     Status: Abnormal   Collection Time: 05/24/16 12:30 PM  Result Value Ref Range Status   Specimen Description BLOOD BLOOD LEFT ARM  Final   Special Requests BOTTLES DRAWN AEROBIC AND ANAEROBIC [facility] EACH  Final   Culture  Setup Time   Final    Anaerobic bottle recovered Gram Positive Cocci Gram Stain Report Called to,Read Back By and Verified With: White,M at 845am by H Flynt  05/25/16 CRITICAL RESULT CALLED TO, READ BACK BY AND VERIFIED WITH: C. KNIGHT, RN ([facility]) AT [place] ON 05/25/16 BY C. JESSUP, MLT.    Culture (A)  Final    STAPHYLOCOCCUS SPECIES (COAGULASE NEGATIVE) THE SIGNIFICANCE OF ISOLATING THIS ORGANISM FROM A SINGLE SET OF BLOOD CULTURES WHEN MULTIPLE SETS ARE DRAWN IS UNCERTAIN. PLEASE NOTIFY THE MICROBIOLOGY  DEPARTMENT WITHIN ONE WEEK IF SPECIATION AND SENSITIVITIES ARE REQUIRED. Performed at [facility]    Report Status 05/27/2016 FINAL  Final  Culture, blood (Routine X 2) w Reflex to ID Panel     Status: None   Collection Time: 05/24/16 12:30 PM  Result Value Ref Range Status   Specimen Description BLOOD RIGHT ANTECUBITAL  Final   Special Requests BOTTLES DRAWN AEROBIC AND ANAEROBIC [place]  Final   Culture NO GROWTH 5 DAYS  Final   Report Status 05/29/2016 FINAL  Final  Blood Culture ID Panel (Reflexed)     Status: Abnormal   Collection Time: 05/24/16 12:30 PM  Result Value Ref Range Status   Enterococcus species NOT DETECTED NOT DETECTED Final   Listeria monocytogenes NOT DETECTED NOT DETECTED Final   Staphylococcus species DETECTED (A) NOT DETECTED Final    Comment: CRITICAL RESULT CALLED TO, READ BACK BY AND VERIFIED WITH: C. KNIGHT, RN ([facility]) AT [place] ON 05/25/16 BY C. JESSUP, MLT.    Staphylococcus aureus NOT DETECTED NOT DETECTED Final   Methicillin resistance NOT DETECTED NOT DETECTED Final   Streptococcus species NOT DETECTED NOT DETECTED Final   Streptococcus agalactiae NOT DETECTED NOT DETECTED Final   Streptococcus pneumoniae NOT DETECTED NOT DETECTED Final   Streptococcus pyogenes NOT DETECTED NOT DETECTED Final   Acinetobacter baumannii NOT DETECTED NOT DETECTED Final   Enterobacteriaceae species NOT DETECTED NOT DETECTED Final   Enterobacter cloacae complex NOT DETECTED NOT DETECTED Final   Escherichia coli NOT DETECTED NOT DETECTED Final   Klebsiella oxytoca NOT DETECTED NOT DETECTED Final   Klebsiella pneumoniae NOT DETECTED NOT DETECTED Final   Proteus species NOT DETECTED NOT DETECTED Final   Serratia marcescens NOT DETECTED NOT DETECTED Final   Haemophilus influenzae NOT DETECTED NOT DETECTED Final   Neisseria meningitidis NOT DETECTED NOT DETECTED Final   Pseudomonas aeruginosa NOT DETECTED NOT DETECTED Final   Candida albicans NOT DETECTED NOT DETECTED  Final   Candida glabrata NOT DETECTED NOT DETECTED Final   Candida krusei NOT DETECTED NOT DETECTED Final   Candida parapsilosis NOT DETECTED NOT DETECTED  Final   Candida tropicalis NOT DETECTED NOT DETECTED Final    Comment: Performed at [facility]  Culture, expectorated sputum-assessment     Status: None   Collection Time: 05/28/16  9:00 AM  Result Value Ref Range Status   Specimen Description EXPECTORATED SPUTUM  Final   Special Requests NONE  Final   Sputum evaluation   Final    THIS SPECIMEN IS ACCEPTABLE. RESPIRATORY CULTURE REPORT TO FOLLOW. Performed at [facility]    Report Status 05/28/2016 FINAL  Final  Culture, respiratory (NON-Expectorated)     Status: None   Collection Time: 05/28/16  9:00 AM  Result Value Ref Range Status   Specimen Description EXPECTORATED SPUTUM  Final   Special Requests NONE  Final   Gram Stain   Final    MODERATE WBC PRESENT, PREDOMINANTLY PMN FEW SQUAMOUS EPITHELIAL CELLS PRESENT MODERATE GRAM POSITIVE COCCI IN CLUSTERS FEW GRAM POSITIVE RODS FEW GRAM NEGATIVE RODS FEW GRAM POSITIVE COCCI IN PAIRS FEW GRAM NEGATIVE COCCOBACILLI    Culture   Final    Consistent with normal respiratory flora. Performed at [facility]    Report Status 05/30/2016 FINAL  Final  MRSA PCR Screening     Status: None   Collection Time: 05/31/16 12:22 PM  Result Value Ref Range Status   MRSA by PCR NEGATIVE NEGATIVE Final    Comment:        The GeneXpert MRSA Assay (FDA approved for NASAL specimens only), is one component of a comprehensive MRSA colonization surveillance program. It is not intended to diagnose MRSA infection nor to guide or monitor treatment for MRSA infections.      Radiology Studies: Dg Chest Port 1 View  Result Date: 05/31/2016 CLINICAL DATA:  Shortness of breath. History of esophageal carcinoma EXAM: PORTABLE CHEST 1 VIEW COMPARISON:  May 28, 2016 FINDINGS: There is increase in airspace consolidation  in the right lower lung zone region. There is apparent scarring with volume loss in the right upper lobe, stable. There is mild left base scarring or atelectasis. Heart size is within normal limits. The pulmonary vascularity is within normal limits. There is atherosclerotic calcification in the aorta. There is calcification in each carotid artery. No adenopathy is evident. No bone lesions are appreciable. IMPRESSION: Progressive consolidation/ airspace disease right lower lung zone stable apparent scarring with volume loss right upper lobe. Scar versus atelectasis left base stable. Stable cardiac silhouette. Aortic atherosclerosis. There is also calcification in each carotid artery. Electronically Signed   By: Lowella Grip III M.D.   On: 05/31/2016 07:35    Scheduled Meds: . albuterol  2.5 mg Nebulization BID  . ALPRAZolam  0.5 mg Oral QHS  . amLODipine  5 mg Oral Daily  . aspirin EC  81 mg Oral Daily  . clopidogrel  75 mg Oral Daily  . heparin  5,000 Units Subcutaneous Q8H  . insulin aspart  0-9 Units Subcutaneous TID WC  . latanoprost  1 drop Both Eyes QHS  . losartan  25 mg Oral Daily  . metoCLOPramide  5 mg Oral TID AC  . metoprolol succinate  50 mg Oral Daily  . mometasone-formoterol  2 puff Inhalation BID  . montelukast  10 mg Oral Daily  . pantoprazole  40 mg Oral BID  . piperacillin-tazobactam (ZOSYN)  IV  3.375 g Intravenous Q8H  . polyethylene glycol  17 g Oral BID  . rosuvastatin  10 mg Oral Daily  . sucralfate  1 g Oral TID  WC & HS  . timolol  1 drop Both Eyes Daily  . tiotropium  18 mcg Inhalation Daily  . [START ON 06/01/2016] vancomycin  1,000 mg Intravenous Q12H   Continuous Infusions:   LOS: 6 days   Mckell Riecke, Orpah Melter, MD [facility] Pager [phone]  If 7PM-7AM, please contact night-coverage [email or web address] Password [email or web address] 05/31/2016, 5:15 PM

## 2016-06-01 ENCOUNTER — Inpatient Hospital Stay (HOSPITAL_COMMUNITY): Payer: Medicare Other

## 2016-06-01 LAB — BASIC METABOLIC PANEL
Anion gap: 8 (ref 5–15)
BUN: 12 mg/dL (ref 6–20)
CALCIUM: 8.4 mg/dL — AB (ref 8.9–10.3)
CO2: 25 mmol/L (ref 22–32)
CREATININE: 0.89 mg/dL (ref 0.61–1.24)
Chloride: 103 mmol/L (ref 101–111)
GFR calc non Af Amer: >60 mL/min (ref >60)
Glucose, Bld: 108 mg/dL — ABNORMAL HIGH (ref 65–99)
Potassium: 3.5 mmol/L (ref 3.5–5.1)
Sodium: 136 mmol/L (ref 135–145)

## 2016-06-01 LAB — CBC
HCT: 36.3 % — ABNORMAL LOW (ref 39.0–52.0)
Hemoglobin: 11.8 g/dL — ABNORMAL LOW (ref 13.0–17.0)
MCH: 29.8 pg (ref 26.0–34.0)
MCHC: 32.5 g/dL (ref 30.0–36.0)
MCV: 91.7 fL (ref 78.0–100.0)
PLATELETS: 504 10*3/uL — AB (ref 150–400)
RBC: 3.96 MIL/uL — AB (ref 4.22–5.81)
RDW: 13.5 % (ref 11.5–15.5)
WBC: 13.5 10*3/uL — ABNORMAL HIGH (ref 4.0–10.5)

## 2016-06-01 LAB — GLUCOSE, CAPILLARY
GLUCOSE-CAPILLARY: 107 mg/dL — AB (ref 65–99)
Glucose-Capillary: 107 mg/dL — ABNORMAL HIGH (ref 65–99)
Glucose-Capillary: 123 mg/dL — ABNORMAL HIGH (ref 65–99)
Glucose-Capillary: 178 mg/dL — ABNORMAL HIGH (ref 65–99)

## 2016-06-01 NOTE — Care Management Note (Signed)
Case Management Note  Patient Details  Name: Gregory Eaton MRN: [id number] Date of Birth: 10-Jan-1946  If discussed at Long Length of Stay Meetings, dates discussed:  06/01/2016  Additional Comments:  Cleaven Demario, Chauncey Reading, RN 06/01/2016, 3:28 PM

## 2016-06-01 NOTE — Progress Notes (Signed)
PROGRESS NOTE    Gregory Eaton  [id number] DOB: 05-05-1946 DOA: 05/25/2016 PCP: Purvis Kilts, MD    Brief Narrative:  70 year old male history of esophageal cancer, COPD, type 2 diabetes, CAD status post CABG who initially presented with aspiration pneumonia. Patient left AGAINST MEDICAL ADVICE. Patient later returned emergency department after being found to have blood cultures that were positive. Patient was subsequently admitted for further workup.  Assessment & Plan:   Principal Problem:   Sepsis due to pneumonia [facility]) Active Problems:   Essential hypertension   CAP (community acquired pneumonia)   DM type 2 (diabetes mellitus, type 2) ([place])   S/P CABG x 5   COPD GOLD II    Sepsis ([place])   Healthcare-associated pneumonia  1. Sepsis secondary to aspiration pneumonia -Patient was initially continued on vancomycin and Zosyn, later Zosyn monotherapy -Appreciate input by speech pathology, recommendations for regular diet with thin liquids. -Patient noted to have 1 out of 2 coag negative staph species-likely contaminant.  -Presenting lactate of over 4 noted. Lactic acid has since resolved -Repeat chest x-ray on 05/28/2016 demonstrated worsening pneumonia and antibiotic was transitioned to Levaquin monotherapy -Follow CXR again demonstrated worsening PNA and patient changed to vanc and zosyn -Patient is strep pneumo negative  -MRSA screen neg -Consulted Pulmonary. Discussed case with Dr. Luan Pulling, who recommends continued zosyn monotherapy for now -CXR this AM demonstrates some improvement. Leukocytosis is improving -Repeat CBC in AM  2. COPD. -no audible wheezing -Continue to wean O2 as tolerated  3. GERD. -STable currently  -no abd pain -Cont reglan  4. DM type 2.  -Glucose trends reviewed this AM -Glucose trends well controlled overnight -Will continue SSI as ordered  5. CAD s/p CABG.  -Remains stable and asymptomatic -Will cont current regimen  6.  HTN.  -vital signs reviewed. Blood pressure well controlled. -Will continue current beta blocker, ARB, norvasc  7. Hypokalemia -Stable at present -Will repeat BMET in AM  DVT prophylaxis: Heparin subcutaneous Code Status: Full code Family Communication: She room, family at bedside Disposition Plan: Anticipate discharge home when weaned off of oxygen  Consultants:   Pulmonary  Procedures:     Antimicrobials: Anti-infectives    Start     Dose/Rate Route Frequency Ordered Stop   06/01/16 0000  vancomycin (VANCOCIN) IVPB 1000 mg/200 mL premix     1,000 mg 200 mL/hr over 60 Minutes Intravenous Every 12 hours 05/31/16 1157     05/31/16 1200  vancomycin (VANCOCIN) 1,500 mg in sodium chloride 0.9 % 500 mL IVPB     1,500 mg 250 mL/hr over 120 Minutes Intravenous  Once 05/31/16 1157 05/31/16 1424   05/31/16 1200  piperacillin-tazobactam (ZOSYN) IVPB 3.375 g     3.375 g 12.5 mL/hr over 240 Minutes Intravenous Every 8 hours 05/31/16 1157     05/28/16 1115  levofloxacin (LEVAQUIN) IVPB 750 mg  Status:  Discontinued     750 mg 100 mL/hr over 90 Minutes Intravenous Every 24 hours 05/28/16 1104 05/31/16 1145   05/25/16 2200  vancomycin (VANCOCIN) IVPB 1000 mg/200 mL premix  Status:  Discontinued     1,000 mg 200 mL/hr over 60 Minutes Intravenous Every 12 hours 05/25/16 1543 05/26/16 1705   05/25/16 1700  piperacillin-tazobactam (ZOSYN) IVPB 3.375 g  Status:  Discontinued     3.375 g 12.5 mL/hr over 240 Minutes Intravenous Every 8 hours 05/25/16 1543 05/28/16 1057   05/25/16 1115  piperacillin-tazobactam (ZOSYN) IVPB 3.375 g     3.375 g  100 mL/hr over 30 Minutes Intravenous  Once 05/25/16 1105 05/25/16 1150   05/25/16 1115  vancomycin (VANCOCIN) IVPB 1000 mg/200 mL premix     1,000 mg 200 mL/hr over 60 Minutes Intravenous  Once 05/25/16 1105 05/25/16 1308      Subjective: Reports feeling better this AM.  Objective: Vitals:   06/01/16 0747 06/01/16 0750 06/01/16 0751 06/01/16  1254  BP:    (!) 120/56  Pulse:    84  Resp:    20  Temp:    98.8 F (37.1 C)  TempSrc:    Oral  SpO2: 90% 95% 93% 96%  Weight:      Height:        Intake/Output Summary (Last 24 hours) at 06/01/16 1700 Last data filed at 06/01/16 1255  Gross per 24 hour  Intake              960 ml  Output                0 ml  Net              960 ml   Filed Weights   05/25/16 1045 05/25/16 1601  Weight: 72.6 kg (160 lb) 71.7 kg (158 lb)    Examination:  General exam:Ambulating in hallway, conversant, no acute distress Respiratory system: normal resp effort, no wheezing Cardiovascular system: Regular rhythm, s1, s2 Gastrointestinal system: soft, nondistended, pos BS Central nervous system: no seizures/ no tremors Extremities: no cyanosis, no joint deformities Skin: No notable skin lesions seen, normal skin turgor Psychiatry: affect normal// no auditory hallucinations  Data Reviewed: I have personally reviewed following labs and imaging studies  CBC:  Recent Labs Lab 05/28/16 0713 05/29/16 0653 05/30/16 0641 05/31/16 0921 06/01/16 0552  WBC 11.9* 10.3 11.7* 15.1* 13.5*  HGB 12.6* 12.1* 12.6* 13.2 11.8*  HCT 37.4* 36.1* 37.4* 38.9* 36.3*  MCV 90.1 90.3 90.6 90.7 91.7  PLT 307 340 405* 452* [id number]*   Basic Metabolic Panel:  Recent Labs Lab 05/26/16 0631 05/29/16 0653 05/30/16 0641 05/31/16 0921 06/01/16 0552  NA 134* 138 136 133* 136  K 3.5 2.9* 3.8 3.9 3.5  CL 103 105 104 103 103  CO2 23 25 22 22 25   GLUCOSE 112* 110* 108* 160* 108*  BUN 9 8 10 11 12   CREATININE 0.72 0.66 0.72 0.81 0.89  CALCIUM 8.2* 8.2* 8.6* 8.7* 8.4*   GFR: Estimated Creatinine Clearance: 73.7 mL/min (by C-G formula based on SCr of 0.89 mg/dL). Liver Function Tests: No results for input(s): AST, ALT, ALKPHOS, BILITOT, PROT, ALBUMIN in the last 168 hours. No results for input(s): LIPASE, AMYLASE in the last 168 hours. No results for input(s): AMMONIA in the last 168 hours. Coagulation  Profile: No results for input(s): INR, PROTIME in the last 168 hours. Cardiac Enzymes: No results for input(s): CKTOTAL, CKMB, CKMBINDEX, TROPONINI in the last 168 hours. BNP (last 3 results) No results for input(s): PROBNP in the last 8760 hours. HbA1C: No results for input(s): HGBA1C in the last 72 hours. CBG:  Recent Labs Lab 05/31/16 1618 05/31/16 2154 06/01/16 0759 06/01/16 1125 06/01/16 1654  GLUCAP 120* 131* 107* 107* 123*   Lipid Profile: No results for input(s): CHOL, HDL, LDLCALC, TRIG, CHOLHDL, LDLDIRECT in the last 72 hours. Thyroid Function Tests: No results for input(s): TSH, T4TOTAL, FREET4, T3FREE, THYROIDAB in the last 72 hours. Anemia Panel: No results for input(s): VITAMINB12, FOLATE, FERRITIN, TIBC, IRON, RETICCTPCT in the last 72 hours. Sepsis Labs: No  results for input(s): PROCALCITON, LATICACIDVEN in the last 168 hours.  Recent Results (from the past 240 hour(s))  Culture, blood (Routine X 2) w Reflex to ID Panel     Status: Abnormal   Collection Time: 05/24/16 12:30 PM  Result Value Ref Range Status   Specimen Description BLOOD BLOOD LEFT ARM  Final   Special Requests BOTTLES DRAWN AEROBIC AND ANAEROBIC [facility] EACH  Final   Culture  Setup Time   Final    Anaerobic bottle recovered Gram Positive Cocci Gram Stain Report Called to,Read Back By and Verified With: White,M at 845am by H Flynt  05/25/16 CRITICAL RESULT CALLED TO, READ BACK BY AND VERIFIED WITH: C. KNIGHT, RN ([facility]) AT [place] ON 05/25/16 BY C. JESSUP, MLT.    Culture (A)  Final    STAPHYLOCOCCUS SPECIES (COAGULASE NEGATIVE) THE SIGNIFICANCE OF ISOLATING THIS ORGANISM FROM A SINGLE SET OF BLOOD CULTURES WHEN MULTIPLE SETS ARE DRAWN IS UNCERTAIN. PLEASE NOTIFY THE MICROBIOLOGY DEPARTMENT WITHIN ONE WEEK IF SPECIATION AND SENSITIVITIES ARE REQUIRED. Performed at [facility]    Report Status 05/27/2016 FINAL  Final  Culture, blood (Routine X 2) w Reflex to ID Panel     Status: None    Collection Time: 05/24/16 12:30 PM  Result Value Ref Range Status   Specimen Description BLOOD RIGHT ANTECUBITAL  Final   Special Requests BOTTLES DRAWN AEROBIC AND ANAEROBIC [place]  Final   Culture NO GROWTH 5 DAYS  Final   Report Status 05/29/2016 FINAL  Final  Blood Culture ID Panel (Reflexed)     Status: Abnormal   Collection Time: 05/24/16 12:30 PM  Result Value Ref Range Status   Enterococcus species NOT DETECTED NOT DETECTED Final   Listeria monocytogenes NOT DETECTED NOT DETECTED Final   Staphylococcus species DETECTED (A) NOT DETECTED Final    Comment: CRITICAL RESULT CALLED TO, READ BACK BY AND VERIFIED WITH: C. KNIGHT, RN ([facility]) AT [place] ON 05/25/16 BY C. JESSUP, MLT.    Staphylococcus aureus NOT DETECTED NOT DETECTED Final   Methicillin resistance NOT DETECTED NOT DETECTED Final   Streptococcus species NOT DETECTED NOT DETECTED Final   Streptococcus agalactiae NOT DETECTED NOT DETECTED Final   Streptococcus pneumoniae NOT DETECTED NOT DETECTED Final   Streptococcus pyogenes NOT DETECTED NOT DETECTED Final   Acinetobacter baumannii NOT DETECTED NOT DETECTED Final   Enterobacteriaceae species NOT DETECTED NOT DETECTED Final   Enterobacter cloacae complex NOT DETECTED NOT DETECTED Final   Escherichia coli NOT DETECTED NOT DETECTED Final   Klebsiella oxytoca NOT DETECTED NOT DETECTED Final   Klebsiella pneumoniae NOT DETECTED NOT DETECTED Final   Proteus species NOT DETECTED NOT DETECTED Final   Serratia marcescens NOT DETECTED NOT DETECTED Final   Haemophilus influenzae NOT DETECTED NOT DETECTED Final   Neisseria meningitidis NOT DETECTED NOT DETECTED Final   Pseudomonas aeruginosa NOT DETECTED NOT DETECTED Final   Candida albicans NOT DETECTED NOT DETECTED Final   Candida glabrata NOT DETECTED NOT DETECTED Final   Candida krusei NOT DETECTED NOT DETECTED Final   Candida parapsilosis NOT DETECTED NOT DETECTED Final   Candida tropicalis NOT DETECTED NOT DETECTED Final     Comment: Performed at [facility]  Culture, expectorated sputum-assessment     Status: None   Collection Time: 05/28/16  9:00 AM  Result Value Ref Range Status   Specimen Description EXPECTORATED SPUTUM  Final   Special Requests NONE  Final   Sputum evaluation   Final    THIS SPECIMEN  IS ACCEPTABLE. RESPIRATORY CULTURE REPORT TO FOLLOW. Performed at [facility]    Report Status 05/28/2016 FINAL  Final  Culture, respiratory (NON-Expectorated)     Status: None   Collection Time: 05/28/16  9:00 AM  Result Value Ref Range Status   Specimen Description EXPECTORATED SPUTUM  Final   Special Requests NONE  Final   Gram Stain   Final    MODERATE WBC PRESENT, PREDOMINANTLY PMN FEW SQUAMOUS EPITHELIAL CELLS PRESENT MODERATE GRAM POSITIVE COCCI IN CLUSTERS FEW GRAM POSITIVE RODS FEW GRAM NEGATIVE RODS FEW GRAM POSITIVE COCCI IN PAIRS FEW GRAM NEGATIVE COCCOBACILLI    Culture   Final    Consistent with normal respiratory flora. Performed at [facility]    Report Status 05/30/2016 FINAL  Final  MRSA PCR Screening     Status: None   Collection Time: 05/31/16 12:22 PM  Result Value Ref Range Status   MRSA by PCR NEGATIVE NEGATIVE Final    Comment:        The GeneXpert MRSA Assay (FDA approved for NASAL specimens only), is one component of a comprehensive MRSA colonization surveillance program. It is not intended to diagnose MRSA infection nor to guide or monitor treatment for MRSA infections.      Radiology Studies: Dg Chest Port 1 View  Result Date: 06/01/2016 CLINICAL DATA:  Follow-up community-acquired pneumonia. History of COPD, previous CABG. EXAM: PORTABLE CHEST 1 VIEW COMPARISON:  Portable chest x-ray of May 31, 2016 FINDINGS: The lungs are well-expanded. Confluent alveolar opacities on the right have become slightly less conspicuous inferiorly. Considerable abnormality remains however. The interstitial markings of both lungs remain increased but  are greatest on the right. The heart is normal in size. There are post CABG changes. There is calcification in the wall of the aortic arch. There is no significant pleural effusion. IMPRESSION: Slight interval improvement in diffuse pneumonia in the right lung. Underlying COPD. Aortic atherosclerosis. Electronically Signed   By: David  Martinique M.D.   On: 06/01/2016 07:13   Dg Chest Port 1 View  Result Date: 05/31/2016 CLINICAL DATA:  Shortness of breath. History of esophageal carcinoma EXAM: PORTABLE CHEST 1 VIEW COMPARISON:  May 28, 2016 FINDINGS: There is increase in airspace consolidation in the right lower lung zone region. There is apparent scarring with volume loss in the right upper lobe, stable. There is mild left base scarring or atelectasis. Heart size is within normal limits. The pulmonary vascularity is within normal limits. There is atherosclerotic calcification in the aorta. There is calcification in each carotid artery. No adenopathy is evident. No bone lesions are appreciable. IMPRESSION: Progressive consolidation/ airspace disease right lower lung zone stable apparent scarring with volume loss right upper lobe. Scar versus atelectasis left base stable. Stable cardiac silhouette. Aortic atherosclerosis. There is also calcification in each carotid artery. Electronically Signed   By: Lowella Grip III M.D.   On: 05/31/2016 07:35    Scheduled Meds: . albuterol  2.5 mg Nebulization BID  . ALPRAZolam  0.5 mg Oral QHS  . amLODipine  5 mg Oral Daily  . aspirin EC  81 mg Oral Daily  . clopidogrel  75 mg Oral Daily  . heparin  5,000 Units Subcutaneous Q8H  . insulin aspart  0-9 Units Subcutaneous TID WC  . latanoprost  1 drop Both Eyes QHS  . losartan  25 mg Oral Daily  . metoCLOPramide  5 mg Oral TID AC  . metoprolol succinate  50 mg Oral Daily  . mometasone-formoterol  2 puff Inhalation BID  . montelukast  10 mg Oral Daily  . pantoprazole  40 mg Oral BID  .  piperacillin-tazobactam (ZOSYN)  IV  3.375 g Intravenous Q8H  . polyethylene glycol  17 g Oral BID  . rosuvastatin  10 mg Oral Daily  . sucralfate  1 g Oral TID WC & HS  . timolol  1 drop Both Eyes Daily  . tiotropium  18 mcg Inhalation Daily  . vancomycin  1,000 mg Intravenous Q12H   Continuous Infusions:   LOS: 7 days   Nazar Kuan, Orpah Melter, MD [facility] Pager [phone]  If 7PM-7AM, please contact night-coverage [email or web address] Password [email or web address] 06/01/2016, 5:00 PM

## 2016-06-01 NOTE — Consult Note (Signed)
Full note to follow. Discussed with Dr. Wyline Copas. Agree this is almost certainly from aspiration. Although he seemed earlier not to have done well on Zosyn alone I would probably drop vancomycin now and go back to Zosyn alone. I will plan to follow with you.  Thanks for allowing me to see him with you

## 2016-06-02 LAB — GLUCOSE, CAPILLARY
GLUCOSE-CAPILLARY: 112 mg/dL — AB (ref 65–99)
GLUCOSE-CAPILLARY: 166 mg/dL — AB (ref 65–99)
Glucose-Capillary: 141 mg/dL — ABNORMAL HIGH (ref 65–99)
Glucose-Capillary: 90 mg/dL (ref 65–99)

## 2016-06-02 LAB — BASIC METABOLIC PANEL
ANION GAP: 8 (ref 5–15)
BUN: 10 mg/dL (ref 6–20)
CALCIUM: 8.6 mg/dL — AB (ref 8.9–10.3)
CO2: 25 mmol/L (ref 22–32)
Chloride: 104 mmol/L (ref 101–111)
Creatinine, Ser: 0.85 mg/dL (ref 0.61–1.24)
GFR calc Af Amer: >60 mL/min (ref >60)
GLUCOSE: 115 mg/dL — AB (ref 65–99)
Potassium: 4 mmol/L (ref 3.5–5.1)
SODIUM: 137 mmol/L (ref 135–145)

## 2016-06-02 LAB — CBC
HCT: 40.1 % (ref 39.0–52.0)
HEMOGLOBIN: 13.3 g/dL (ref 13.0–17.0)
MCH: 30.4 pg (ref 26.0–34.0)
MCHC: 33.2 g/dL (ref 30.0–36.0)
MCV: 91.8 fL (ref 78.0–100.0)
Platelets: 531 10*3/uL — ABNORMAL HIGH (ref 150–400)
RBC: 4.37 MIL/uL (ref 4.22–5.81)
RDW: 13.6 % (ref 11.5–15.5)
WBC: 14.6 10*3/uL — ABNORMAL HIGH (ref 4.0–10.5)

## 2016-06-02 NOTE — Progress Notes (Signed)
PROGRESS NOTE    Gregory Eaton  [id number] DOB: [birth year] DOA: 05/25/2016 PCP: Purvis Kilts, MD    Brief Narrative:  70 year old male history of esophageal cancer, COPD, type 2 diabetes, CAD status post CABG who initially presented with aspiration pneumonia. Patient left AGAINST MEDICAL ADVICE. Patient later returned emergency department after being found to have blood cultures that were positive. Patient was subsequently admitted for further workup.  Assessment & Plan:   Principal Problem:   Sepsis due to pneumonia [facility]) Active Problems:   Essential hypertension   CAP (community acquired pneumonia)   DM type 2 (diabetes mellitus, type 2) ([place])   S/P CABG x 5   COPD GOLD II    Sepsis ([place])   Healthcare-associated pneumonia  1. Sepsis secondary to aspiration pneumonia -Patient was initially continued on vancomycin and Zosyn, later Zosyn monotherapy -Appreciate input by speech pathology, recommendations for regular diet with thin liquids. -Patient noted to have 1 out of 2 coag negative staph species-likely contaminant.  -Presenting lactate of over 4 noted. Lactic acid has since resolved -Repeat chest x-ray on 05/28/2016 demonstrated worsening pneumonia and antibiotic was transitioned to Levaquin monotherapy -Follow CXR again demonstrated worsening PNA and patient changed to vanc and zosyn -Patient is strep pneumo negative  -MRSA screen neg -Consulted Pulmonary. Discussed case with Dr. Luan Pulling, who recommends continued zosyn monotherapy for an extra 24 hours -CXR this AM demonstrates some improvement. Leukocytosis is improving -Repeat CBC in AM  2. COPD. -no audible wheezing -Continue to wean O2 as tolerated  3. GERD. -STable currently  -no abd pain -Cont reglan  4. DM type 2.  -Glucose trends reviewed this AM -Glucose trends well controlled overnight -Will continue SSI as ordered  5. CAD s/p CABG.  -Remains stable and asymptomatic -Will cont current  regimen  6. HTN.  -vital signs reviewed. Blood pressure well controlled. -Will continue current beta blocker, ARB, norvasc  7. Hypokalemia -Stable at present -Will repeat BMET in AM  DVT prophylaxis: Heparin subcutaneous Code Status: Full code Family Communication: She room, family at bedside Disposition Plan: Anticipate discharge home when weaned off of oxygen, hopefully in 24 hours  Consultants:   Pulmonary  Procedures:     Antimicrobials: Anti-infectives    Start     Dose/Rate Route Frequency Ordered Stop   06/01/16 0000  vancomycin (VANCOCIN) IVPB 1000 mg/200 mL premix  Status:  Discontinued     1,000 mg 200 mL/hr over 60 Minutes Intravenous Every 12 hours 05/31/16 1157 06/02/16 0026   05/31/16 1200  vancomycin (VANCOCIN) 1,500 mg in sodium chloride 0.9 % 500 mL IVPB     1,500 mg 250 mL/hr over 120 Minutes Intravenous  Once 05/31/16 1157 05/31/16 1424   05/31/16 1200  piperacillin-tazobactam (ZOSYN) IVPB 3.375 g     3.375 g 12.5 mL/hr over 240 Minutes Intravenous Every 8 hours 05/31/16 1157     05/28/16 1115  levofloxacin (LEVAQUIN) IVPB 750 mg  Status:  Discontinued     750 mg 100 mL/hr over 90 Minutes Intravenous Every 24 hours 05/28/16 1104 05/31/16 1145   05/25/16 2200  vancomycin (VANCOCIN) IVPB 1000 mg/200 mL premix  Status:  Discontinued     1,000 mg 200 mL/hr over 60 Minutes Intravenous Every 12 hours 05/25/16 1543 05/26/16 1705   05/25/16 1700  piperacillin-tazobactam (ZOSYN) IVPB 3.375 g  Status:  Discontinued     3.375 g 12.5 mL/hr over 240 Minutes Intravenous Every 8 hours 05/25/16 1543 05/28/16 1057   05/25/16 1115  piperacillin-tazobactam (ZOSYN) IVPB 3.375 g     3.375 g 100 mL/hr over 30 Minutes Intravenous  Once 05/25/16 1105 05/25/16 1150   05/25/16 1115  vancomycin (VANCOCIN) IVPB 1000 mg/200 mL premix     1,000 mg 200 mL/hr over 60 Minutes Intravenous  Once 05/25/16 1105 05/25/16 1308      Subjective: Reports feeling better this  AM.  Objective: Vitals:   06/02/16 0846 06/02/16 0854 06/02/16 0856 06/02/16 1515  BP:    119/68  Pulse:    84  Resp:    20  Temp:    98.6 F (37 C)  TempSrc:    Oral  SpO2: 92% 95% 95% 96%  Weight:      Height:        Intake/Output Summary (Last 24 hours) at 06/02/16 1526 Last data filed at 06/02/16 0900  Gross per 24 hour  Intake              970 ml  Output                0 ml  Net              970 ml   Filed Weights   05/25/16 1045 05/25/16 1601  Weight: 72.6 kg (160 lb) 71.7 kg (158 lb)    Examination:  General exam:Ambulating in hallway, conversant, no acute distress Respiratory system: normal resp effort, no wheezing Cardiovascular system: Regular rhythm, s1, s2 Gastrointestinal system: soft, nondistended, pos BS Central nervous system: no seizures/ no tremors Extremities: no cyanosis, no joint deformities Skin: No notable skin lesions seen, normal skin turgor Psychiatry: affect normal// no auditory hallucinations  Data Reviewed: I have personally reviewed following labs and imaging studies  CBC:  Recent Labs Lab 05/29/16 0653 05/30/16 0641 05/31/16 0921 06/01/16 0552 06/02/16 0600  WBC 10.3 11.7* 15.1* 13.5* 14.6*  HGB 12.1* 12.6* 13.2 11.8* 13.3  HCT 36.1* 37.4* 38.9* 36.3* 40.1  MCV 90.3 90.6 90.7 91.7 91.8  PLT 340 405* 452* 504* 0000000*   Basic Metabolic Panel:  Recent Labs Lab 05/29/16 0653 05/30/16 0641 05/31/16 0921 06/01/16 0552 06/02/16 0600  NA 138 136 133* 136 137  K 2.9* 3.8 3.9 3.5 4.0  CL 105 104 103 103 104  CO2 25 22 22 25 25   GLUCOSE 110* 108* 160* 108* 115*  BUN 8 10 11 12 10   CREATININE 0.66 0.72 0.81 0.89 0.85  CALCIUM 8.2* 8.6* 8.7* 8.4* 8.6*   GFR: Estimated Creatinine Clearance: 77.1 mL/min (by C-G formula based on SCr of 0.85 mg/dL). Liver Function Tests: No results for input(s): AST, ALT, ALKPHOS, BILITOT, PROT, ALBUMIN in the last 168 hours. No results for input(s): LIPASE, AMYLASE in the last 168 hours. No  results for input(s): AMMONIA in the last 168 hours. Coagulation Profile: No results for input(s): INR, PROTIME in the last 168 hours. Cardiac Enzymes: No results for input(s): CKTOTAL, CKMB, CKMBINDEX, TROPONINI in the last 168 hours. BNP (last 3 results) No results for input(s): PROBNP in the last 8760 hours. HbA1C: No results for input(s): HGBA1C in the last 72 hours. CBG:  Recent Labs Lab 06/01/16 1125 06/01/16 1654 06/01/16 2043 06/02/16 0755 06/02/16 1143  GLUCAP 107* 123* 178* 112* 141*   Lipid Profile: No results for input(s): CHOL, HDL, LDLCALC, TRIG, CHOLHDL, LDLDIRECT in the last 72 hours. Thyroid Function Tests: No results for input(s): TSH, T4TOTAL, FREET4, T3FREE, THYROIDAB in the last 72 hours. Anemia Panel: No results for input(s): VITAMINB12, FOLATE, FERRITIN, TIBC,  IRON, RETICCTPCT in the last 72 hours. Sepsis Labs: No results for input(s): PROCALCITON, LATICACIDVEN in the last 168 hours.  Recent Results (from the past 240 hour(s))  Culture, blood (Routine X 2) w Reflex to ID Panel     Status: Abnormal   Collection Time: 05/24/16 12:30 PM  Result Value Ref Range Status   Specimen Description BLOOD BLOOD LEFT ARM  Final   Special Requests BOTTLES DRAWN AEROBIC AND ANAEROBIC [facility] EACH  Final   Culture  Setup Time   Final    Anaerobic bottle recovered Gram Positive Cocci Gram Stain Report Called to,Read Back By and Verified With: White,M at 845am by H Flynt  05/25/16 CRITICAL RESULT CALLED TO, READ BACK BY AND VERIFIED WITH: C. KNIGHT, RN ([facility]) AT [place] ON 05/25/16 BY C. JESSUP, MLT.    Culture (A)  Final    STAPHYLOCOCCUS SPECIES (COAGULASE NEGATIVE) THE SIGNIFICANCE OF ISOLATING THIS ORGANISM FROM A SINGLE SET OF BLOOD CULTURES WHEN MULTIPLE SETS ARE DRAWN IS UNCERTAIN. PLEASE NOTIFY THE MICROBIOLOGY DEPARTMENT WITHIN ONE WEEK IF SPECIATION AND SENSITIVITIES ARE REQUIRED. Performed at [facility]    Report Status 05/27/2016 FINAL  Final  Culture,  blood (Routine X 2) w Reflex to ID Panel     Status: None   Collection Time: 05/24/16 12:30 PM  Result Value Ref Range Status   Specimen Description BLOOD RIGHT ANTECUBITAL  Final   Special Requests BOTTLES DRAWN AEROBIC AND ANAEROBIC [place]  Final   Culture NO GROWTH 5 DAYS  Final   Report Status 05/29/2016 FINAL  Final  Blood Culture ID Panel (Reflexed)     Status: Abnormal   Collection Time: 05/24/16 12:30 PM  Result Value Ref Range Status   Enterococcus species NOT DETECTED NOT DETECTED Final   Listeria monocytogenes NOT DETECTED NOT DETECTED Final   Staphylococcus species DETECTED (A) NOT DETECTED Final    Comment: CRITICAL RESULT CALLED TO, READ BACK BY AND VERIFIED WITH: C. KNIGHT, RN ([facility]) AT [place] ON 05/25/16 BY C. JESSUP, MLT.    Staphylococcus aureus NOT DETECTED NOT DETECTED Final   Methicillin resistance NOT DETECTED NOT DETECTED Final   Streptococcus species NOT DETECTED NOT DETECTED Final   Streptococcus agalactiae NOT DETECTED NOT DETECTED Final   Streptococcus pneumoniae NOT DETECTED NOT DETECTED Final   Streptococcus pyogenes NOT DETECTED NOT DETECTED Final   Acinetobacter baumannii NOT DETECTED NOT DETECTED Final   Enterobacteriaceae species NOT DETECTED NOT DETECTED Final   Enterobacter cloacae complex NOT DETECTED NOT DETECTED Final   Escherichia coli NOT DETECTED NOT DETECTED Final   Klebsiella oxytoca NOT DETECTED NOT DETECTED Final   Klebsiella pneumoniae NOT DETECTED NOT DETECTED Final   Proteus species NOT DETECTED NOT DETECTED Final   Serratia marcescens NOT DETECTED NOT DETECTED Final   Haemophilus influenzae NOT DETECTED NOT DETECTED Final   Neisseria meningitidis NOT DETECTED NOT DETECTED Final   Pseudomonas aeruginosa NOT DETECTED NOT DETECTED Final   Candida albicans NOT DETECTED NOT DETECTED Final   Candida glabrata NOT DETECTED NOT DETECTED Final   Candida krusei NOT DETECTED NOT DETECTED Final   Candida parapsilosis NOT DETECTED NOT DETECTED  Final   Candida tropicalis NOT DETECTED NOT DETECTED Final    Comment: Performed at [facility]  Culture, expectorated sputum-assessment     Status: None   Collection Time: 05/28/16  9:00 AM  Result Value Ref Range Status   Specimen Description EXPECTORATED SPUTUM  Final   Special Requests NONE  Final  Sputum evaluation   Final    THIS SPECIMEN IS ACCEPTABLE. RESPIRATORY CULTURE REPORT TO FOLLOW. Performed at [facility]    Report Status 05/28/2016 FINAL  Final  Culture, respiratory (NON-Expectorated)     Status: None   Collection Time: 05/28/16  9:00 AM  Result Value Ref Range Status   Specimen Description EXPECTORATED SPUTUM  Final   Special Requests NONE  Final   Gram Stain   Final    MODERATE WBC PRESENT, PREDOMINANTLY PMN FEW SQUAMOUS EPITHELIAL CELLS PRESENT MODERATE GRAM POSITIVE COCCI IN CLUSTERS FEW GRAM POSITIVE RODS FEW GRAM NEGATIVE RODS FEW GRAM POSITIVE COCCI IN PAIRS FEW GRAM NEGATIVE COCCOBACILLI    Culture   Final    Consistent with normal respiratory flora. Performed at [facility]    Report Status 05/30/2016 FINAL  Final  MRSA PCR Screening     Status: None   Collection Time: 05/31/16 12:22 PM  Result Value Ref Range Status   MRSA by PCR NEGATIVE NEGATIVE Final    Comment:        The GeneXpert MRSA Assay (FDA approved for NASAL specimens only), is one component of a comprehensive MRSA colonization surveillance program. It is not intended to diagnose MRSA infection nor to guide or monitor treatment for MRSA infections.      Radiology Studies: Dg Chest Port 1 View  Result Date: 06/01/2016 CLINICAL DATA:  Follow-up community-acquired pneumonia. History of COPD, previous CABG. EXAM: PORTABLE CHEST 1 VIEW COMPARISON:  Portable chest x-ray of May 31, 2016 FINDINGS: The lungs are well-expanded. Confluent alveolar opacities on the right have become slightly less conspicuous inferiorly. Considerable abnormality remains  however. The interstitial markings of both lungs remain increased but are greatest on the right. The heart is normal in size. There are post CABG changes. There is calcification in the wall of the aortic arch. There is no significant pleural effusion. IMPRESSION: Slight interval improvement in diffuse pneumonia in the right lung. Underlying COPD. Aortic atherosclerosis. Electronically Signed   By: David  Martinique M.D.   On: 06/01/2016 07:13    Scheduled Meds: . albuterol  2.5 mg Nebulization BID  . ALPRAZolam  0.5 mg Oral QHS  . amLODipine  5 mg Oral Daily  . aspirin EC  81 mg Oral Daily  . clopidogrel  75 mg Oral Daily  . heparin  5,000 Units Subcutaneous Q8H  . insulin aspart  0-9 Units Subcutaneous TID WC  . latanoprost  1 drop Both Eyes QHS  . losartan  25 mg Oral Daily  . metoCLOPramide  5 mg Oral TID AC  . metoprolol succinate  50 mg Oral Daily  . mometasone-formoterol  2 puff Inhalation BID  . montelukast  10 mg Oral Daily  . pantoprazole  40 mg Oral BID  . piperacillin-tazobactam (ZOSYN)  IV  3.375 g Intravenous Q8H  . polyethylene glycol  17 g Oral BID  . rosuvastatin  10 mg Oral Daily  . sucralfate  1 g Oral TID WC & HS  . timolol  1 drop Both Eyes Daily  . tiotropium  18 mcg Inhalation Daily   Continuous Infusions:   LOS: 8 days   Lelon Frohlich, MD [facility] Pager [phone]  If 7PM-7AM, please contact night-coverage [email or web address] Password [email or web address] 06/02/2016, 3:26 PM

## 2016-06-02 NOTE — Progress Notes (Signed)
Subjective: He says he feels okay. He is coughing some but not as much. No chest pain. No nausea vomiting diarrhea. No other new symptoms.  Objective: Vital signs in last 24 hours: Temp:  [98.1 F (36.7 C)-98.8 F (37.1 C)] 98.3 F (36.8 C) (11/15 0500) Pulse Rate:  [84-91] 86 (11/15 0500) Resp:  [20] 20 (11/15 0500) BP: (119-121)/(54-56) 119/55 (11/15 0500) SpO2:  [93 %-96 %] 94 % (11/15 0500) Weight change:  Last BM Date: 06/01/16  Intake/Output from previous day: 11/14 0701 - 11/15 0700 In: 1210 [P.O.:960; IV Piggyback:250] Out: -   PHYSICAL EXAM General appearance: alert, cooperative and no distress Resp: He has bilateral rhonchi and some wheezing but better Cardio: regular rate and rhythm, S1, S2 normal, no murmur, click, rub or gallop GI: soft, non-tender; bowel sounds normal; no masses,  no organomegaly Extremities: extremities normal, atraumatic, no cyanosis or edema Skin warm and dry. Mucous membranes are moist  Lab Results:  Results for orders placed or performed during the hospital encounter of 05/25/16 (from the past 48 hour(s))  CBC     Status: Abnormal   Collection Time: 05/31/16  9:21 AM  Result Value Ref Range   WBC 15.1 (H) 4.0 - 10.5 K/uL   RBC 4.29 4.22 - 5.81 MIL/uL   Hemoglobin 13.2 13.0 - 17.0 g/dL   HCT 38.9 (L) 39.0 - 52.0 %   MCV 90.7 78.0 - 100.0 fL   MCH 30.8 26.0 - 34.0 pg   MCHC 33.9 30.0 - 36.0 g/dL   RDW 13.6 11.5 - 15.5 %   Platelets 452 (H) 150 - 400 K/uL  Basic metabolic panel     Status: Abnormal   Collection Time: 05/31/16  9:21 AM  Result Value Ref Range   Sodium 133 (L) 135 - 145 mmol/L   Potassium 3.9 3.5 - 5.1 mmol/L   Chloride 103 101 - 111 mmol/L   CO2 22 22 - 32 mmol/L   Glucose, Bld 160 (H) 65 - 99 mg/dL   BUN 11 6 - 20 mg/dL   Creatinine, Ser 0.81 0.61 - 1.24 mg/dL   Calcium 8.7 (L) 8.9 - 10.3 mg/dL   GFR calc non Af Amer >60 >60 mL/min   GFR calc Af Amer >60 >60 mL/min    Comment: (NOTE) The eGFR has been  calculated using the CKD EPI equation. This calculation has not been validated in all clinical situations. eGFR's persistently <60 mL/min signify possible Chronic Kidney Disease.    Anion gap 8 5 - 15  Glucose, capillary     Status: Abnormal   Collection Time: 05/31/16 11:37 AM  Result Value Ref Range   Glucose-Capillary 149 (H) 65 - 99 mg/dL  MRSA PCR Screening     Status: None   Collection Time: 05/31/16 12:22 PM  Result Value Ref Range   MRSA by PCR NEGATIVE NEGATIVE    Comment:        The GeneXpert MRSA Assay (FDA approved for NASAL specimens only), is one component of a comprehensive MRSA colonization surveillance program. It is not intended to diagnose MRSA infection nor to guide or monitor treatment for MRSA infections.   Glucose, capillary     Status: Abnormal   Collection Time: 05/31/16  4:18 PM  Result Value Ref Range   Glucose-Capillary 120 (H) 65 - 99 mg/dL   Comment 1 Notify RN    Comment 2 Document in Chart   Glucose, capillary     Status: Abnormal   Collection Time:  05/31/16  9:54 PM  Result Value Ref Range   Glucose-Capillary 131 (H) 65 - 99 mg/dL  Basic metabolic panel     Status: Abnormal   Collection Time: 06/01/16  5:52 AM  Result Value Ref Range   Sodium 136 135 - 145 mmol/L   Potassium 3.5 3.5 - 5.1 mmol/L   Chloride 103 101 - 111 mmol/L   CO2 25 22 - 32 mmol/L   Glucose, Bld 108 (H) 65 - 99 mg/dL   BUN 12 6 - 20 mg/dL   Creatinine, Ser 0.89 0.61 - 1.24 mg/dL   Calcium 8.4 (L) 8.9 - 10.3 mg/dL   GFR calc non Af Amer >60 >60 mL/min   GFR calc Af Amer >60 >60 mL/min    Comment: (NOTE) The eGFR has been calculated using the CKD EPI equation. This calculation has not been validated in all clinical situations. eGFR's persistently <60 mL/min signify possible Chronic Kidney Disease.    Anion gap 8 5 - 15  CBC     Status: Abnormal   Collection Time: 06/01/16  5:52 AM  Result Value Ref Range   WBC 13.5 (H) 4.0 - 10.5 K/uL   RBC 3.96 (L) 4.22 -  5.81 MIL/uL   Hemoglobin 11.8 (L) 13.0 - 17.0 g/dL   HCT 36.3 (L) 39.0 - 52.0 %   MCV 91.7 78.0 - 100.0 fL   MCH 29.8 26.0 - 34.0 pg   MCHC 32.5 30.0 - 36.0 g/dL   RDW 13.5 11.5 - 15.5 %   Platelets 504 (H) 150 - 400 K/uL  Glucose, capillary     Status: Abnormal   Collection Time: 06/01/16  7:59 AM  Result Value Ref Range   Glucose-Capillary 107 (H) 65 - 99 mg/dL  Glucose, capillary     Status: Abnormal   Collection Time: 06/01/16 11:25 AM  Result Value Ref Range   Glucose-Capillary 107 (H) 65 - 99 mg/dL  Glucose, capillary     Status: Abnormal   Collection Time: 06/01/16  4:54 PM  Result Value Ref Range   Glucose-Capillary 123 (H) 65 - 99 mg/dL   Comment 1 Notify RN   Glucose, capillary     Status: Abnormal   Collection Time: 06/01/16  8:43 PM  Result Value Ref Range   Glucose-Capillary 178 (H) 65 - 99 mg/dL   Comment 1 Notify RN    Comment 2 Document in Chart   Basic metabolic panel     Status: Abnormal   Collection Time: 06/02/16  6:00 AM  Result Value Ref Range   Sodium 137 135 - 145 mmol/L   Potassium 4.0 3.5 - 5.1 mmol/L   Chloride 104 101 - 111 mmol/L   CO2 25 22 - 32 mmol/L   Glucose, Bld 115 (H) 65 - 99 mg/dL   BUN 10 6 - 20 mg/dL   Creatinine, Ser 0.85 0.61 - 1.24 mg/dL   Calcium 8.6 (L) 8.9 - 10.3 mg/dL   GFR calc non Af Amer >60 >60 mL/min   GFR calc Af Amer >60 >60 mL/min    Comment: (NOTE) The eGFR has been calculated using the CKD EPI equation. This calculation has not been validated in all clinical situations. eGFR's persistently <60 mL/min signify possible Chronic Kidney Disease.    Anion gap 8 5 - 15  CBC     Status: Abnormal   Collection Time: 06/02/16  6:00 AM  Result Value Ref Range   WBC 14.6 (H) 4.0 - 10.5 K/uL  RBC 4.37 4.22 - 5.81 MIL/uL   Hemoglobin 13.3 13.0 - 17.0 g/dL   HCT 40.1 39.0 - 52.0 %   MCV 91.8 78.0 - 100.0 fL   MCH 30.4 26.0 - 34.0 pg   MCHC 33.2 30.0 - 36.0 g/dL   RDW 13.6 11.5 - 15.5 %   Platelets 531 (H) 150 - 400  K/uL  Glucose, capillary     Status: Abnormal   Collection Time: 06/02/16  7:55 AM  Result Value Ref Range   Glucose-Capillary 112 (H) 65 - 99 mg/dL   Comment 1 Notify RN    Comment 2 Document in Chart     ABGS No results for input(s): PHART, PO2ART, TCO2, HCO3 in the last 72 hours.  Invalid input(s): PCO2 CULTURES Recent Results (from the past 240 hour(s))  Culture, blood (Routine X 2) w Reflex to ID Panel     Status: Abnormal   Collection Time: 05/24/16 12:30 PM  Result Value Ref Range Status   Specimen Description BLOOD BLOOD LEFT ARM  Final   Special Requests BOTTLES DRAWN AEROBIC AND ANAEROBIC [facility] EACH  Final   Culture  Setup Time   Final    Anaerobic bottle recovered Gram Positive Cocci Gram Stain Report Called to,Read Back By and Verified With: White,M at 845am by H Flynt  05/25/16 CRITICAL RESULT CALLED TO, READ BACK BY AND VERIFIED WITH: C. KNIGHT, RN ([facility]) AT [place] ON 05/25/16 BY C. JESSUP, MLT.    Culture (A)  Final    STAPHYLOCOCCUS SPECIES (COAGULASE NEGATIVE) THE SIGNIFICANCE OF ISOLATING THIS ORGANISM FROM A SINGLE SET OF BLOOD CULTURES WHEN MULTIPLE SETS ARE DRAWN IS UNCERTAIN. PLEASE NOTIFY THE MICROBIOLOGY DEPARTMENT WITHIN ONE WEEK IF SPECIATION AND SENSITIVITIES ARE REQUIRED. Performed at [facility]    Report Status 05/27/2016 FINAL  Final  Culture, blood (Routine X 2) w Reflex to ID Panel     Status: None   Collection Time: 05/24/16 12:30 PM  Result Value Ref Range Status   Specimen Description BLOOD RIGHT ANTECUBITAL  Final   Special Requests BOTTLES DRAWN AEROBIC AND ANAEROBIC [place]  Final   Culture NO GROWTH 5 DAYS  Final   Report Status 05/29/2016 FINAL  Final  Blood Culture ID Panel (Reflexed)     Status: Abnormal   Collection Time: 05/24/16 12:30 PM  Result Value Ref Range Status   Enterococcus species NOT DETECTED NOT DETECTED Final   Listeria monocytogenes NOT DETECTED NOT DETECTED Final   Staphylococcus species DETECTED (A) NOT  DETECTED Final    Comment: CRITICAL RESULT CALLED TO, READ BACK BY AND VERIFIED WITH: C. KNIGHT, RN ([facility]) AT [place] ON 05/25/16 BY C. JESSUP, MLT.    Staphylococcus aureus NOT DETECTED NOT DETECTED Final   Methicillin resistance NOT DETECTED NOT DETECTED Final   Streptococcus species NOT DETECTED NOT DETECTED Final   Streptococcus agalactiae NOT DETECTED NOT DETECTED Final   Streptococcus pneumoniae NOT DETECTED NOT DETECTED Final   Streptococcus pyogenes NOT DETECTED NOT DETECTED Final   Acinetobacter baumannii NOT DETECTED NOT DETECTED Final   Enterobacteriaceae species NOT DETECTED NOT DETECTED Final   Enterobacter cloacae complex NOT DETECTED NOT DETECTED Final   Escherichia coli NOT DETECTED NOT DETECTED Final   Klebsiella oxytoca NOT DETECTED NOT DETECTED Final   Klebsiella pneumoniae NOT DETECTED NOT DETECTED Final   Proteus species NOT DETECTED NOT DETECTED Final   Serratia marcescens NOT DETECTED NOT DETECTED Final   Haemophilus influenzae NOT DETECTED NOT DETECTED Final   Neisseria  meningitidis NOT DETECTED NOT DETECTED Final   Pseudomonas aeruginosa NOT DETECTED NOT DETECTED Final   Candida albicans NOT DETECTED NOT DETECTED Final   Candida glabrata NOT DETECTED NOT DETECTED Final   Candida krusei NOT DETECTED NOT DETECTED Final   Candida parapsilosis NOT DETECTED NOT DETECTED Final   Candida tropicalis NOT DETECTED NOT DETECTED Final    Comment: Performed at [facility]  Culture, expectorated sputum-assessment     Status: None   Collection Time: 05/28/16  9:00 AM  Result Value Ref Range Status   Specimen Description EXPECTORATED SPUTUM  Final   Special Requests NONE  Final   Sputum evaluation   Final    THIS SPECIMEN IS ACCEPTABLE. RESPIRATORY CULTURE REPORT TO FOLLOW. Performed at [facility]    Report Status 05/28/2016 FINAL  Final  Culture, respiratory (NON-Expectorated)     Status: None   Collection Time: 05/28/16  9:00 AM  Result Value Ref Range  Status   Specimen Description EXPECTORATED SPUTUM  Final   Special Requests NONE  Final   Gram Stain   Final    MODERATE WBC PRESENT, PREDOMINANTLY PMN FEW SQUAMOUS EPITHELIAL CELLS PRESENT MODERATE GRAM POSITIVE COCCI IN CLUSTERS FEW GRAM POSITIVE RODS FEW GRAM NEGATIVE RODS FEW GRAM POSITIVE COCCI IN PAIRS FEW GRAM NEGATIVE COCCOBACILLI    Culture   Final    Consistent with normal respiratory flora. Performed at [facility]    Report Status 05/30/2016 FINAL  Final  MRSA PCR Screening     Status: None   Collection Time: 05/31/16 12:22 PM  Result Value Ref Range Status   MRSA by PCR NEGATIVE NEGATIVE Final    Comment:        The GeneXpert MRSA Assay (FDA approved for NASAL specimens only), is one component of a comprehensive MRSA colonization surveillance program. It is not intended to diagnose MRSA infection nor to guide or monitor treatment for MRSA infections.    Studies/Results: Dg Chest Port 1 View  Result Date: 06/01/2016 CLINICAL DATA:  Follow-up community-acquired pneumonia. History of COPD, previous CABG. EXAM: PORTABLE CHEST 1 VIEW COMPARISON:  Portable chest x-ray of May 31, 2016 FINDINGS: The lungs are well-expanded. Confluent alveolar opacities on the right have become slightly less conspicuous inferiorly. Considerable abnormality remains however. The interstitial markings of both lungs remain increased but are greatest on the right. The heart is normal in size. There are post CABG changes. There is calcification in the wall of the aortic arch. There is no significant pleural effusion. IMPRESSION: Slight interval improvement in diffuse pneumonia in the right lung. Underlying COPD. Aortic atherosclerosis. Electronically Signed   By: David  Martinique M.D.   On: 06/01/2016 07:13    Medications:  Prior to Admission:  Prescriptions Prior to Admission  Medication Sig Dispense Refill Last Dose  . albuterol (PROVENTIL) (2.5 MG/3ML) 0.083% nebulizer solution  Take 3 mLs (2.5 mg total) by nebulization every 6 (six) hours as needed for wheezing or shortness of breath. Patient will also need nebulizer machine please 75 mL 12 05/24/2016 at Unknown time  . ALPRAZolam (XANAX) 0.5 MG tablet Take 1 tablet (0.5 mg total) by mouth at bedtime. 30 tablet 0 05/24/2016 at Unknown time  . amLODipine (NORVASC) 5 MG tablet TAKE ONE (1) TABLET BY MOUTH EVERY DAY 90 tablet 3 05/25/2016 at Unknown time  . aspirin 81 MG EC tablet Take 81 mg by mouth daily.     05/25/2016 at Unknown time  . budesonide-formoterol (SYMBICORT) 160-4.5 MCG/ACT inhaler  Inhale 2 puffs into the lungs 2 (two) times daily. 2 Inhaler 0 05/25/2016 at Unknown time  . clopidogrel (PLAVIX) 75 MG tablet TAKE ONE (1) TABLET BY MOUTH EVERY DAY 30 tablet 6 05/25/2016 at 0930  . latanoprost (XALATAN) 0.005 % ophthalmic solution Place 1 drop into both eyes at bedtime.    05/24/2016 at Unknown time  . levofloxacin (LEVAQUIN) 500 MG tablet Take 1 tablet (500 mg total) by mouth daily. 10 tablet 0 05/24/2016 at Unknown time  . losartan (COZAAR) 50 MG tablet Take 1.5 tablets (75 mg total) by mouth daily. 135 tablet 0 05/25/2016 at Unknown time  . metoprolol succinate (TOPROL-XL) 50 MG 24 hr tablet Take 1 tablet (50 mg total) by mouth daily. 90 tablet 0 05/25/2016 at 0930  . montelukast (SINGULAIR) 10 MG tablet Take 1 tablet by mouth daily.   05/25/2016 at Unknown time  . Multiple Vitamins-Minerals (CENTRUM SILVER PO) Take 1 tablet by mouth daily.     05/25/2016 at Unknown time  . nitroGLYCERIN (NITROSTAT) 0.4 MG SL tablet Place 1 tablet (0.4 mg total) under the tongue every 5 (five) minutes as needed for chest pain. 25 tablet 3 unknown  . pantoprazole (PROTONIX) 40 MG tablet TAKE ONE (1) TABLET EACH DAY 30 tablet 11 05/25/2016 at Unknown time  . ranitidine (ZANTAC) 150 MG tablet Take 150 mg by mouth at bedtime.   05/24/2016 at Unknown time  . rosuvastatin (CRESTOR) 10 MG tablet TAKE ONE (1) TABLET BY MOUTH EVERY DAY 90 tablet 0  05/25/2016 at Unknown time  . sucralfate (CARAFATE) 1 G tablet Take 1 tablet (1 g total) by mouth 4 (four) times daily -  with meals and at bedtime. 40 tablet 0 05/24/2016 at Unknown time  . timolol (TIMOPTIC) 0.5 % ophthalmic solution Place 1 drop into both eyes daily.    05/25/2016 at Unknown time  . tiotropium (SPIRIVA) 18 MCG inhalation capsule Place 1 capsule (18 mcg total) into inhaler and inhale daily. 30 capsule 6 05/25/2016 at Unknown time   Scheduled: . albuterol  2.5 mg Nebulization BID  . ALPRAZolam  0.5 mg Oral QHS  . amLODipine  5 mg Oral Daily  . aspirin EC  81 mg Oral Daily  . clopidogrel  75 mg Oral Daily  . heparin  5,000 Units Subcutaneous Q8H  . insulin aspart  0-9 Units Subcutaneous TID WC  . latanoprost  1 drop Both Eyes QHS  . losartan  25 mg Oral Daily  . metoCLOPramide  5 mg Oral TID AC  . metoprolol succinate  50 mg Oral Daily  . mometasone-formoterol  2 puff Inhalation BID  . montelukast  10 mg Oral Daily  . pantoprazole  40 mg Oral BID  . piperacillin-tazobactam (ZOSYN)  IV  3.375 g Intravenous Q8H  . polyethylene glycol  17 g Oral BID  . rosuvastatin  10 mg Oral Daily  . sucralfate  1 g Oral TID WC & HS  . timolol  1 drop Both Eyes Daily  . tiotropium  18 mcg Inhalation Daily   Continuous:  ZDG:[id number], albuterol, benzonatate, ketorolac, nitroGLYCERIN, ondansetron (ZOFRAN) IV  Assesment: He was admitted with sepsis from pneumonia. This is almost certainly from aspiration. He is improving. He is on Zosyn alone now and when he is ready for discharge patient should be discharged home on Augmentin for probably another week. He is clinically better although his x-ray findings have waxed and waned and he still has some elevation of his white blood cell count.  Principal Problem:   Sepsis due to pneumonia [facility]) Active Problems:   Essential hypertension   CAP (community acquired pneumonia)   DM type 2 (diabetes mellitus, type 2) ([place])   S/P CABG x 5   COPD  GOLD II    Sepsis ([place])   Healthcare-associated pneumonia    Plan: As above. I think from a pulmonary point of view it's reasonable to keep him another 24 hours and make sure he is doing better on Zosyn and then get him home if he's doing well.    LOS: 8 days   , L 06/02/2016, 8:39 AM

## 2016-06-03 LAB — GLUCOSE, CAPILLARY: Glucose-Capillary: 120 mg/dL — ABNORMAL HIGH (ref 65–99)

## 2016-06-03 MED ORDER — AMOXICILLIN-POT CLAVULANATE 875-125 MG PO TABS: 1.0000 | ORAL_TABLET | Freq: Two times a day (BID) | ORAL | 0 refills | Status: DC

## 2016-06-03 NOTE — Progress Notes (Signed)
Subjective: He says he feels better. No fever. No productive cough. No nausea or vomiting.  Objective: Vital signs in last 24 hours: Temp:  [97 F (36.1 C)-98.6 F (37 C)] 98.5 F (36.9 C) (11/16 0645) Pulse Rate:  [82-87] 82 (11/16 0645) Resp:  [14-20] 14 (11/16 0645) BP: (118-121)/(65-68) 118/67 (11/16 0645) SpO2:  [92 %-96 %] 95 % (11/16 0645) Weight change:  Last BM Date: 06/02/16  Intake/Output from previous day: 11/15 0701 - 11/16 0700 In: 240 [P.O.:240] Out: -   PHYSICAL EXAM General appearance: alert, cooperative and no distress Resp: clear to auscultation bilaterally Cardio: regular rate and rhythm, S1, S2 normal, no murmur, click, rub or gallop GI: soft, non-tender; bowel sounds normal; no masses,  no organomegaly Extremities: extremities normal, atraumatic, no cyanosis or edema Skin, warm and dry  Lab Results:  Results for orders placed or performed during the hospital encounter of 05/25/16 (from the past 48 hour(s))  Glucose, capillary     Status: Abnormal   Collection Time: 06/01/16 11:25 AM  Result Value Ref Range   Glucose-Capillary 107 (H) 65 - 99 mg/dL  Glucose, capillary     Status: Abnormal   Collection Time: 06/01/16  4:54 PM  Result Value Ref Range   Glucose-Capillary 123 (H) 65 - 99 mg/dL   Comment 1 Notify RN   Glucose, capillary     Status: Abnormal   Collection Time: 06/01/16  8:43 PM  Result Value Ref Range   Glucose-Capillary 178 (H) 65 - 99 mg/dL   Comment 1 Notify RN    Comment 2 Document in Chart   Basic metabolic panel     Status: Abnormal   Collection Time: 06/02/16  6:00 AM  Result Value Ref Range   Sodium 137 135 - 145 mmol/L   Potassium 4.0 3.5 - 5.1 mmol/L   Chloride 104 101 - 111 mmol/L   CO2 25 22 - 32 mmol/L   Glucose, Bld 115 (H) 65 - 99 mg/dL   BUN 10 6 - 20 mg/dL   Creatinine, Ser 0.85 0.61 - 1.24 mg/dL   Calcium 8.6 (L) 8.9 - 10.3 mg/dL   GFR calc non Af Amer >60 >60 mL/min   GFR calc Af Amer >60 >60 mL/min     Comment: (NOTE) The eGFR has been calculated using the CKD EPI equation. This calculation has not been validated in all clinical situations. eGFR's persistently <60 mL/min signify possible Chronic Kidney Disease.    Anion gap 8 5 - 15  CBC     Status: Abnormal   Collection Time: 06/02/16  6:00 AM  Result Value Ref Range   WBC 14.6 (H) 4.0 - 10.5 K/uL   RBC 4.37 4.22 - 5.81 MIL/uL   Hemoglobin 13.3 13.0 - 17.0 g/dL   HCT 40.1 39.0 - 52.0 %   MCV 91.8 78.0 - 100.0 fL   MCH 30.4 26.0 - 34.0 pg   MCHC 33.2 30.0 - 36.0 g/dL   RDW 13.6 11.5 - 15.5 %   Platelets 531 (H) 150 - 400 K/uL  Glucose, capillary     Status: Abnormal   Collection Time: 06/02/16  7:55 AM  Result Value Ref Range   Glucose-Capillary 112 (H) 65 - 99 mg/dL   Comment 1 Notify RN    Comment 2 Document in Chart   Glucose, capillary     Status: Abnormal   Collection Time: 06/02/16 11:43 AM  Result Value Ref Range   Glucose-Capillary 141 (H) 65 - 99 mg/dL  Comment 1 Notify RN    Comment 2 Document in Chart   Glucose, capillary     Status: None   Collection Time: 06/02/16  4:42 PM  Result Value Ref Range   Glucose-Capillary 90 65 - 99 mg/dL   Comment 1 Notify RN    Comment 2 Document in Chart   Glucose, capillary     Status: Abnormal   Collection Time: 06/02/16  8:53 PM  Result Value Ref Range   Glucose-Capillary 166 (H) 65 - 99 mg/dL    ABGS No results for input(s): PHART, PO2ART, TCO2, HCO3 in the last 72 hours.  Invalid input(s): PCO2 CULTURES Recent Results (from the past 240 hour(s))  Culture, blood (Routine X 2) w Reflex to ID Panel     Status: Abnormal   Collection Time: 05/24/16 12:30 PM  Result Value Ref Range Status   Specimen Description BLOOD BLOOD LEFT ARM  Final   Special Requests BOTTLES DRAWN AEROBIC AND ANAEROBIC [facility] EACH  Final   Culture  Setup Time   Final    Anaerobic bottle recovered Gram Positive Cocci Gram Stain Report Called to,Read Back By and Verified With: White,M at 845am by  H Flynt  05/25/16 CRITICAL RESULT CALLED TO, READ BACK BY AND VERIFIED WITH: C. KNIGHT, RN ([facility]) AT [place] ON 05/25/16 BY C. JESSUP, MLT.    Culture (A)  Final    STAPHYLOCOCCUS SPECIES (COAGULASE NEGATIVE) THE SIGNIFICANCE OF ISOLATING THIS ORGANISM FROM A SINGLE SET OF BLOOD CULTURES WHEN MULTIPLE SETS ARE DRAWN IS UNCERTAIN. PLEASE NOTIFY THE MICROBIOLOGY DEPARTMENT WITHIN ONE WEEK IF SPECIATION AND SENSITIVITIES ARE REQUIRED. Performed at [facility]    Report Status 05/27/2016 FINAL  Final  Culture, blood (Routine X 2) w Reflex to ID Panel     Status: None   Collection Time: 05/24/16 12:30 PM  Result Value Ref Range Status   Specimen Description BLOOD RIGHT ANTECUBITAL  Final   Special Requests BOTTLES DRAWN AEROBIC AND ANAEROBIC [place]  Final   Culture NO GROWTH 5 DAYS  Final   Report Status 05/29/2016 FINAL  Final  Blood Culture ID Panel (Reflexed)     Status: Abnormal   Collection Time: 05/24/16 12:30 PM  Result Value Ref Range Status   Enterococcus species NOT DETECTED NOT DETECTED Final   Listeria monocytogenes NOT DETECTED NOT DETECTED Final   Staphylococcus species DETECTED (A) NOT DETECTED Final    Comment: CRITICAL RESULT CALLED TO, READ BACK BY AND VERIFIED WITH: C. KNIGHT, RN ([facility]) AT [place] ON 05/25/16 BY C. JESSUP, MLT.    Staphylococcus aureus NOT DETECTED NOT DETECTED Final   Methicillin resistance NOT DETECTED NOT DETECTED Final   Streptococcus species NOT DETECTED NOT DETECTED Final   Streptococcus agalactiae NOT DETECTED NOT DETECTED Final   Streptococcus pneumoniae NOT DETECTED NOT DETECTED Final   Streptococcus pyogenes NOT DETECTED NOT DETECTED Final   Acinetobacter baumannii NOT DETECTED NOT DETECTED Final   Enterobacteriaceae species NOT DETECTED NOT DETECTED Final   Enterobacter cloacae complex NOT DETECTED NOT DETECTED Final   Escherichia coli NOT DETECTED NOT DETECTED Final   Klebsiella oxytoca NOT DETECTED NOT DETECTED Final   Klebsiella  pneumoniae NOT DETECTED NOT DETECTED Final   Proteus species NOT DETECTED NOT DETECTED Final   Serratia marcescens NOT DETECTED NOT DETECTED Final   Haemophilus influenzae NOT DETECTED NOT DETECTED Final   Neisseria meningitidis NOT DETECTED NOT DETECTED Final   Pseudomonas aeruginosa NOT DETECTED NOT DETECTED Final   Candida albicans NOT  DETECTED NOT DETECTED Final   Candida glabrata NOT DETECTED NOT DETECTED Final   Candida krusei NOT DETECTED NOT DETECTED Final   Candida parapsilosis NOT DETECTED NOT DETECTED Final   Candida tropicalis NOT DETECTED NOT DETECTED Final    Comment: Performed at [facility]  Culture, expectorated sputum-assessment     Status: None   Collection Time: 05/28/16  9:00 AM  Result Value Ref Range Status   Specimen Description EXPECTORATED SPUTUM  Final   Special Requests NONE  Final   Sputum evaluation   Final    THIS SPECIMEN IS ACCEPTABLE. RESPIRATORY CULTURE REPORT TO FOLLOW. Performed at [facility]    Report Status 05/28/2016 FINAL  Final  Culture, respiratory (NON-Expectorated)     Status: None   Collection Time: 05/28/16  9:00 AM  Result Value Ref Range Status   Specimen Description EXPECTORATED SPUTUM  Final   Special Requests NONE  Final   Gram Stain   Final    MODERATE WBC PRESENT, PREDOMINANTLY PMN FEW SQUAMOUS EPITHELIAL CELLS PRESENT MODERATE GRAM POSITIVE COCCI IN CLUSTERS FEW GRAM POSITIVE RODS FEW GRAM NEGATIVE RODS FEW GRAM POSITIVE COCCI IN PAIRS FEW GRAM NEGATIVE COCCOBACILLI    Culture   Final    Consistent with normal respiratory flora. Performed at [facility]    Report Status 05/30/2016 FINAL  Final  MRSA PCR Screening     Status: None   Collection Time: 05/31/16 12:22 PM  Result Value Ref Range Status   MRSA by PCR NEGATIVE NEGATIVE Final    Comment:        The GeneXpert MRSA Assay (FDA approved for NASAL specimens only), is one component of a comprehensive MRSA colonization surveillance  program. It is not intended to diagnose MRSA infection nor to guide or monitor treatment for MRSA infections.    Studies/Results: No results found.  Medications:  Prior to Admission:  Prescriptions Prior to Admission  Medication Sig Dispense Refill Last Dose  . albuterol (PROVENTIL) (2.5 MG/3ML) 0.083% nebulizer solution Take 3 mLs (2.5 mg total) by nebulization every 6 (six) hours as needed for wheezing or shortness of breath. Patient will also need nebulizer machine please 75 mL 12 05/24/2016 at Unknown time  . ALPRAZolam (XANAX) 0.5 MG tablet Take 1 tablet (0.5 mg total) by mouth at bedtime. 30 tablet 0 05/24/2016 at Unknown time  . amLODipine (NORVASC) 5 MG tablet TAKE ONE (1) TABLET BY MOUTH EVERY DAY 90 tablet 3 05/25/2016 at Unknown time  . aspirin 81 MG EC tablet Take 81 mg by mouth daily.     05/25/2016 at Unknown time  . budesonide-formoterol (SYMBICORT) 160-4.5 MCG/ACT inhaler Inhale 2 puffs into the lungs 2 (two) times daily. 2 Inhaler 0 05/25/2016 at Unknown time  . clopidogrel (PLAVIX) 75 MG tablet TAKE ONE (1) TABLET BY MOUTH EVERY DAY 30 tablet 6 05/25/2016 at 0930  . latanoprost (XALATAN) 0.005 % ophthalmic solution Place 1 drop into both eyes at bedtime.    05/24/2016 at Unknown time  . levofloxacin (LEVAQUIN) 500 MG tablet Take 1 tablet (500 mg total) by mouth daily. 10 tablet 0 05/24/2016 at Unknown time  . losartan (COZAAR) 50 MG tablet Take 1.5 tablets (75 mg total) by mouth daily. 135 tablet 0 05/25/2016 at Unknown time  . metoprolol succinate (TOPROL-XL) 50 MG 24 hr tablet Take 1 tablet (50 mg total) by mouth daily. 90 tablet 0 05/25/2016 at 0930  . montelukast (SINGULAIR) 10 MG tablet Take 1 tablet by mouth daily.  05/25/2016 at Unknown time  . Multiple Vitamins-Minerals (CENTRUM SILVER PO) Take 1 tablet by mouth daily.     05/25/2016 at Unknown time  . nitroGLYCERIN (NITROSTAT) 0.4 MG SL tablet Place 1 tablet (0.4 mg total) under the tongue every 5 (five) minutes as needed  for chest pain. 25 tablet 3 unknown  . pantoprazole (PROTONIX) 40 MG tablet TAKE ONE (1) TABLET EACH DAY 30 tablet 11 05/25/2016 at Unknown time  . ranitidine (ZANTAC) 150 MG tablet Take 150 mg by mouth at bedtime.   05/24/2016 at Unknown time  . rosuvastatin (CRESTOR) 10 MG tablet TAKE ONE (1) TABLET BY MOUTH EVERY DAY 90 tablet 0 05/25/2016 at Unknown time  . sucralfate (CARAFATE) 1 G tablet Take 1 tablet (1 g total) by mouth 4 (four) times daily -  with meals and at bedtime. 40 tablet 0 05/24/2016 at Unknown time  . timolol (TIMOPTIC) 0.5 % ophthalmic solution Place 1 drop into both eyes daily.    05/25/2016 at Unknown time  . tiotropium (SPIRIVA) 18 MCG inhalation capsule Place 1 capsule (18 mcg total) into inhaler and inhale daily. 30 capsule 6 05/25/2016 at Unknown time   Scheduled: . albuterol  2.5 mg Nebulization BID  . ALPRAZolam  0.5 mg Oral QHS  . amLODipine  5 mg Oral Daily  . aspirin EC  81 mg Oral Daily  . clopidogrel  75 mg Oral Daily  . heparin  5,000 Units Subcutaneous Q8H  . insulin aspart  0-9 Units Subcutaneous TID WC  . latanoprost  1 drop Both Eyes QHS  . losartan  25 mg Oral Daily  . metoCLOPramide  5 mg Oral TID AC  . metoprolol succinate  50 mg Oral Daily  . mometasone-formoterol  2 puff Inhalation BID  . montelukast  10 mg Oral Daily  . pantoprazole  40 mg Oral BID  . piperacillin-tazobactam (ZOSYN)  IV  3.375 g Intravenous Q8H  . polyethylene glycol  17 g Oral BID  . rosuvastatin  10 mg Oral Daily  . sucralfate  1 g Oral TID WC & HS  . timolol  1 drop Both Eyes Daily  . tiotropium  18 mcg Inhalation Daily   Continuous:  YIA:[id number], albuterol, benzonatate, nitroGLYCERIN, ondansetron (ZOFRAN) IV  Assesment: He was admitted with pneumonia. This is likely aspiration. He seems to be improving. He's been on Zosyn. No fever. He is off oxygen now. He says he is not back at baseline but he is getting better Principal Problem:   Sepsis due to pneumonia  [facility]) Active Problems:   Essential hypertension   CAP (community acquired pneumonia)   DM type 2 (diabetes mellitus, type 2) ([place])   S/P CABG x 5   COPD GOLD II    Sepsis ([place])   Healthcare-associated pneumonia    Plan: Continue current treatments. Another 5-7 days of Augmentin as an outpatient and he'll need a chest x-ray in about 4-6 weeks to document clearing.  I will plan to sign off since he hopes to go home. Thanks for allowing me to see him with you    LOS: 9 days   Kotaro Buer L 06/03/2016, 8:12 AM

## 2016-06-03 NOTE — Progress Notes (Signed)
SATURATION QUALIFICATIONS: (This note is used to comply with regulatory documentation for home oxygen)  Patient Saturations on Room Air at Rest = 96%  Patient Saturations on Room Air while Ambulating = 88%  Patient Saturations on 2 Liters of oxygen while Ambulating = 94%  Please briefly explain why patient needs home oxygen: Pt. O2 sats drop to 88% while ambulating.

## 2016-06-03 NOTE — Care Management (Signed)
Patient qualifies for oxygen. He however feels he does not need it and refuses oxygen at this time. States that if he becomes to feel SOB, he will follow up with his PCP or Pulmonologist.

## 2016-06-03 NOTE — Discharge Summary (Addendum)
Physician Discharge Summary  Gregory Eaton [id number] DOB: March 22, 1946 DOA: 05/25/2016  PCP: Purvis Kilts, MD  Admit date: 05/25/2016 Discharge date: 06/03/2016  Time spent: 45 minutes  Recommendations for Outpatient Follow-up:  -Will be discharged home today. -Advised to follow up with PCP in 2 weeks to determine continued oxygen needs. -7 days of Augmentin prescribed. -Recommend repeat CXR in 4-6 weeks to document complete resolution of PNA.   Discharge Diagnoses:  Principal Problem:   Sepsis due to pneumonia [facility]) Active Problems:   Essential hypertension   CAP (community acquired pneumonia)   DM type 2 (diabetes mellitus, type 2) ([place])   S/P CABG x 5   COPD GOLD II    Sepsis ([place])   Healthcare-associated pneumonia   Discharge Condition: Stable and improved  Filed Weights   05/25/16 1045 05/25/16 1601  Weight: 72.6 kg (160 lb) 71.7 kg (158 lb)    History of present illness:  As per Dr. Candiss Norse on 11/7: Gregory Eaton  is a 70 y.o. male, with a history of insufficient cancer which is being treated, COPD, DM type 2, and CAD s/p CABG, who was admitted yesterday for possible aspiration in which he has severe ongoing reflux. He was found to have pneumonia in which he left AMA. He has returned to the ED After he was called by the ER with a positive blood culture result which came from yesterday's visit. He is being admitted for further evaluation of sepsis secondary to pneumonia.   He denies fever chills, he does feel weak all over, he is positive for cough and shortness of breath his cough is productive, he says that on Saturday he woke up from sleep with a large amount of food that had regurgitated it in his mouth and he subsequently aspirated and since then he's been feeling sick. This is a ongoing problem for which she is being followed by GI physician Dr. [name] and he has a upcoming EGD in the next few weeks.  He denies any chest pain, no abdominal pain, no  diarrhea, he is infected and constipated, no blood in stool or urine, no focal weakness, no new skin rashes or bruises.  Hospital Course:   1. Sepsis secondary to aspiration pneumonia -Patient was initially continued on vancomycin and Zosyn, later Zosyn monotherapy -Appreciate input by speech pathology, recommendations for regular diet with thin liquids. -Patient noted to have 1 out of 2 coag negative staph species-likely contaminant.  -Presenting lactate of over 4 noted. Lactic acid has since resolved -Repeat chest x-ray on 05/28/2016 demonstrated worsening pneumonia and antibiotic was transitioned to Levaquin monotherapy -Follow CXR again demonstrated worsening PNA and patient changed to vanc and zosyn -Patient is strep pneumo negative  -MRSA screen neg -Will be sent home today on a 7 day course of Augmentin to complete treatment for his PNA. -Recommend CXR in 4-6 weeks.  2. COPD. -no audible wheezing -Qualifies for home oxygen, will have CM arrange.  3. GERD. -STable currently  -no abd pain  4. DM type 2.  -Glucose trends well controlled overnight -Will continue SSI as ordered  5. CAD s/p CABG.  -Remains stable and asymptomatic -Will cont current regimen  6. HTN.  -vital signs reviewed. Blood pressure well controlled. -Will continue current beta blocker, ARB, norvasc  7. Hypokalemia -Stable at present  Procedures:  None   Consultations:  Pulmonary, Dr. Luan Pulling  Discharge Instructions  Discharge Instructions    Diet - low sodium heart healthy  Complete by:  As directed    Increase activity slowly    Complete by:  As directed        Medication List    STOP taking these medications   budesonide-formoterol 160-4.5 MCG/ACT inhaler Commonly known as:  SYMBICORT   levofloxacin 500 MG tablet Commonly known as:  LEVAQUIN     TAKE these medications   albuterol (2.5 MG/3ML) 0.083% nebulizer solution Commonly known as:  PROVENTIL Take 3 mLs (2.5 mg  total) by nebulization every 6 (six) hours as needed for wheezing or shortness of breath. Patient will also need nebulizer machine please   ALPRAZolam 0.5 MG tablet Commonly known as:  XANAX Take 1 tablet (0.5 mg total) by mouth at bedtime.   amLODipine 5 MG tablet Commonly known as:  NORVASC TAKE ONE (1) TABLET BY MOUTH EVERY DAY   amoxicillin-clavulanate 875-125 MG tablet Commonly known as:  AUGMENTIN Take 1 tablet by mouth 2 (two) times daily.   aspirin 81 MG EC tablet Take 81 mg by mouth daily.   CENTRUM SILVER PO Take 1 tablet by mouth daily.   clopidogrel 75 MG tablet Commonly known as:  PLAVIX TAKE ONE (1) TABLET BY MOUTH EVERY DAY   latanoprost 0.005 % ophthalmic solution Commonly known as:  XALATAN Place 1 drop into both eyes at bedtime.   losartan 50 MG tablet Commonly known as:  COZAAR Take 1.5 tablets (75 mg total) by mouth daily.   metoprolol succinate 50 MG 24 hr tablet Commonly known as:  TOPROL-XL Take 1 tablet (50 mg total) by mouth daily.   montelukast 10 MG tablet Commonly known as:  SINGULAIR Take 1 tablet by mouth daily.   nitroGLYCERIN 0.4 MG SL tablet Commonly known as:  NITROSTAT Place 1 tablet (0.4 mg total) under the tongue every 5 (five) minutes as needed for chest pain.   pantoprazole 40 MG tablet Commonly known as:  PROTONIX TAKE ONE (1) TABLET EACH DAY   ranitidine 150 MG tablet Commonly known as:  ZANTAC Take 150 mg by mouth at bedtime.   rosuvastatin 10 MG tablet Commonly known as:  CRESTOR TAKE ONE (1) TABLET BY MOUTH EVERY DAY   sucralfate 1 g tablet Commonly known as:  CARAFATE Take 1 tablet (1 g total) by mouth 4 (four) times daily -  with meals and at bedtime.   timolol 0.5 % ophthalmic solution Commonly known as:  TIMOPTIC Place 1 drop into both eyes daily.   tiotropium 18 MCG inhalation capsule Commonly known as:  SPIRIVA Place 1 capsule (18 mcg total) into inhaler and inhale daily.      Allergies  Allergen  Reactions  . Altace [Ramipril] Cough  . Prednisone Other (See Comments)    "throat broke out"    Follow-up Information    Purvis Kilts, MD. Schedule an appointment as soon as possible for a visit in 2 week(s).   Specialty:  Family Medicine Contact information: [address] [place] [id number] [phone]            The results of significant diagnostics from this hospitalization (including imaging, microbiology, ancillary and laboratory) are listed below for reference.    Significant Diagnostic Studies: Dg Chest 2 View  Result Date: 05/24/2016 CLINICAL DATA:  Pt comes in with productive cough starting 3 days ago. Pt states he is coughing up yellow and green sputum. Pt has hx of COPD. Wheezing noted to RLL. NAD noted EXAM: CHEST  2 VIEW COMPARISON:  05/12/2016 FINDINGS: Hyperinflation. Prior  median sternotomy. Moderate thoracic spondylosis. Midline trachea. Normal heart size. Atherosclerosis in the transverse aorta. No pleural effusion or pneumothorax. Diffuse peribronchial thickening. Right middle lobe airspace disease is new. Left base volume loss and scarring. IMPRESSION: Right middle lobe airspace disease, consistent with pneumonia. Underlying hyperinflation is consistent with COPD. Electronically Signed   By: Abigail Miyamoto M.D.   On: 05/24/2016 10:31   Dg Chest 2 View  Result Date: 05/12/2016 CLINICAL DATA:  Follow-up pneumonia.  Persistent cough. EXAM: CHEST  2 VIEW COMPARISON:  12/02/2015 FINDINGS: Linear densities persist in the lingula, favor scarring. Linear densities in the left upper lobe likely reflects scarring as well. There is hyperinflation of the lungs compatible with COPD. Prior CABG. Heart is normal size. No confluent opacities or effusions. IMPRESSION: Lingular and left upper lobe scarring. COPD. No active disease. Electronically Signed   By: Rolm Baptise M.D.   On: 05/12/2016 10:33   Dg Chest Port 1 View  Result Date: 06/01/2016 CLINICAL DATA:   Follow-up community-acquired pneumonia. History of COPD, previous CABG. EXAM: PORTABLE CHEST 1 VIEW COMPARISON:  Portable chest x-ray of May 31, 2016 FINDINGS: The lungs are well-expanded. Confluent alveolar opacities on the right have become slightly less conspicuous inferiorly. Considerable abnormality remains however. The interstitial markings of both lungs remain increased but are greatest on the right. The heart is normal in size. There are post CABG changes. There is calcification in the wall of the aortic arch. There is no significant pleural effusion. IMPRESSION: Slight interval improvement in diffuse pneumonia in the right lung. Underlying COPD. Aortic atherosclerosis. Electronically Signed   By: David  Martinique M.D.   On: 06/01/2016 07:13   Dg Chest Port 1 View  Result Date: 05/31/2016 CLINICAL DATA:  Shortness of breath. History of esophageal carcinoma EXAM: PORTABLE CHEST 1 VIEW COMPARISON:  May 28, 2016 FINDINGS: There is increase in airspace consolidation in the right lower lung zone region. There is apparent scarring with volume loss in the right upper lobe, stable. There is mild left base scarring or atelectasis. Heart size is within normal limits. The pulmonary vascularity is within normal limits. There is atherosclerotic calcification in the aorta. There is calcification in each carotid artery. No adenopathy is evident. No bone lesions are appreciable. IMPRESSION: Progressive consolidation/ airspace disease right lower lung zone stable apparent scarring with volume loss right upper lobe. Scar versus atelectasis left base stable. Stable cardiac silhouette. Aortic atherosclerosis. There is also calcification in each carotid artery. Electronically Signed   By: Lowella Grip III M.D.   On: 05/31/2016 07:35   Dg Chest Port 1 View  Result Date: 05/28/2016 CLINICAL DATA:  Shortness of breath today. Recent diagnosis cysts of middle lobe pneumonia. History of COPD, coronary artery  disease and CABG, former smoker. EXAM: PORTABLE CHEST 1 VIEW COMPARISON:  PA and lateral chest x-ray of May 24, 2016. FINDINGS: The left lung is mildly hyperinflated and clear. On the right progressive increase in interstitial density has occurred at the lung base. There is obscuration of portions of the hemidiaphragm and right heart border. The heart is top-normal in size. The pulmonary vascularity is not engorged. There are post CABG changes. There is calcification in the wall of the aortic arch. IMPRESSION: Worsening of right mid and lower lobe density consistent with progressive pneumonia. Underlying COPD. No CHF. Aortic atherosclerosis. Electronically Signed   By: David  Martinique M.D.   On: 05/28/2016 09:05   Dg Swallowing Func-speech Pathology  Result Date: 05/26/2016 Objective Swallowing Evaluation: Type  of Study: MBS-Modified Barium Swallow Study Patient Details Name: Gregory Eaton MRN: [id number] Date of Birth: 07/14/1946  Today's Date: 05/26/2016 Time: SLP Start Time (ACUTE ONLY): 1022-SLP Stop Time (ACUTE ONLY): 1115 SLP Time Calculation (min) (ACUTE ONLY): 53 min  Past Medical History:    Past Medical History: Diagnosis Date . Adenomatous colon polyp 03/2009  Last colonoscopy by Dr. [name]  . Adenomatous polyp 2010 . Adenomatous polyp of colon 11/03/2010 . Barrett's esophagus  . CAD (coronary artery disease)  . COPD (chronic obstructive pulmonary disease) (H[facility])   Severe emphysema per CT . Diverticulosis  . DM type 2 (diabetes mellitus, type 2) ([place]) 04/09/2014  A1c 6.5 (04/08/14)  . Esophageal carcinoma ([place]) 03/2009  T1N1M0 . GERD (gastroesophageal reflux disease)  . History of Doppler ultrasound 11/09/2011  03/2014- 50-69% L ICA stenosis;carotid doppler; L bulb/prox ICA 0-49% diameter reduction; L vertebral artery - occlusive ds; L ECA  demonstrates severe amount of fibrous plaque . History of Doppler ultrasound 11/09/11  LEAs; R ABI - mod art. insuff.; L ABI normal at rest; R SFA - occlusive  ds; L SFA - occlusive ds; patent fem-pop graft . History of echocardiogram 08/27/2009  EF >55% . History of nuclear stress test 11/24/2011  lexiscan; normal perfusion; low risk scan; non-diagnostic for ischemia . Hyperlipidemia  . Hypertension  . Left carotid artery stenosis 04/08/2014 . Pulmonary nodule, right 04/08/2014  2.8 mm-incidental finding on CT . PVD (peripheral vascular disease) ([place])  . Tachyarrhythmia 1999  Status post ablation at [facility] . Tobacco abuse   Past Surgical History:     Past Surgical History: Procedure Laterality Date . COLONOSCOPY  03/17/2009  Dr.Rourk- normal rectum, sigmoid diverticula, some pale sigmoid mucosa with diffuse petechiae. pedunculated polyp at the splenic flexure, remainder of colonic mucosa appeared normal. bx= adenomatous polyp . COLONOSCOPY  04/19/2003  Dr.Rehman- few diverticu;a at the sigmoid colon, 3 small polyps, one at the transverse colon and 2 at the sigmoid, small external hemorrhoids. bx report not available.  . CORONARY ARTERY BYPASS GRAFT  1998  [name] . ESOPHAGECTOMY  2010  [facility] Dr. [name] . ESOPHAGOGASTRODUODENOSCOPY  11/25/2010  Dr.Rourk- s/p esophagectomy with gastric pull-up, esophageal erosions straddling the surgical anastomosis, salmon colored epithelium coming up a good centimeter to a centimeter and a half above the suture line, islands of salmon colored epithelium in the most poximal residual esophagus, remainder of gastric mucosa appeared normal. bx= swamous &gastric glandular mucosa w/chronic active inflammation . ESOPHAGOGASTRODUODENOSCOPY  03/17/2009  Dr.Rourk- 4cm segment of salmon-colored epithlium distal esophagus suspicious for barretts esophagus. area of suspicious nodularity w/in this segment bx seperately. small to moderate size hiatal hernia, o/w normal stomach D1 and D2 bx=adenocarcinoma . FEMORAL-POPLITEAL BYPASS GRAFT  1993  occlusive ds in R SFA . GASTRIC PULL THROUGH  2010  With esophagectomy . LIPOMA  EXCISION  2010  HPI: [name] a 70 y.o.male,with a history of insufficient cancer which is being treated, COPD, DM type 2, and CAD s/p CABG, who was admitted yesterday for possible aspiration in which he has severe ongoing reflux. He was found to have pneumonia in which he left AMA. He has returned to the ED After he was called by the ER with a positive blood culture result which came from yesterday's visit. He is being admitted for further evaluation of sepsis secondary to pneumonia.  Subjective: "Sometimes I have food and phlegm come up at night."   Assessment / Plan / Recommendation  CHL IP CLINICAL IMPRESSIONS 05/26/2016 Therapy Diagnosis  Suspected primary esophageal dysphagia Clinical Impression Pt seen for MBSS in lateral position in [place] chair and assessed with thin cup and straw, puree, regular textures, and barium tablet with thin. Oropharyngeal swallow is essentially WNL with only mild pyriform pooling with thin liquids after the primary swallow from residuals in valleculae/base of tongue spilling to pyriforms. Pt was generally not aware of pooling and did not always spontaneously clear. When cued to initiate a "dry swallow", pyriform residuals cleared. Several esophageal sweeps were completed due to suspicion of decreased motility. Incomplete clearance of puree and regular textures noted and stasis in what would be his thoracic esophagus (pt s/p esophagectomy with gastric pull up) which did not clear after one minute. Pt was then given liquid wash, which did clear the majority of bolus, but not all. Suspect that over the course of an entire meal, residuals would increase without liquid wash. Pt and his wife state that pt typically has very little fluid intake throughout the day. Recommend self regulated regular textures (pt advised by his surgeon to follow reflux diet and avoid dry,crunchy foods) and follow solids with liquids periodically to facilitate clearance to lower portion of  stomach. Importance of reflux precautions were reinforced to pt and his wife. Pt was shown on video, stasis in mid chest. Please see additional images on PACS hyperlink tab at the top of this report. No further SLP services indicated at this time. Pt to follow up with Dr. [name] in a few weeks per wife. Above to Dr. Wyline Copas. Impact on safety and function Mild aspiration risk    CHL IP TREATMENT RECOMMENDATION 05/26/2016 Treatment Recommendations No treatment recommended at this time   Prognosis 05/25/2016 Prognosis for Safe Diet Advancement Good Barriers to Reach Goals -- Barriers/Prognosis Comment --   CHL IP DIET RECOMMENDATION 05/26/2016 SLP Diet Recommendations Regular solids;Thin liquid Liquid Administration via Cup Medication Administration Whole meds with liquid Compensations Follow solids with liquid Postural Changes Remain semi-upright after after feeds/meals (Comment);Seated upright at 90 degrees    CHL IP OTHER RECOMMENDATIONS 05/26/2016 Recommended Consults Consider esophageal assessment Oral Care Recommendations Oral care BID Other Recommendations Clarify dietary restrictions    CHL IP FOLLOW UP RECOMMENDATIONS 05/26/2016 Follow up Recommendations None    CHL IP FREQUENCY AND DURATION 05/25/2016 Speech Therapy Frequency (ACUTE ONLY) min 1 x/week Treatment Duration 1 week       CHL IP ORAL PHASE 05/26/2016 Oral Phase WFL Oral - Pudding Teaspoon -- Oral - Pudding Cup -- Oral - Honey Teaspoon -- Oral - Honey Cup -- Oral - Nectar Teaspoon -- Oral - Nectar Cup -- Oral - Nectar Straw -- Oral - Thin Teaspoon -- Oral - Thin Cup -- Oral - Thin Straw -- Oral - Puree -- Oral - Mech Soft -- Oral - Regular -- Oral - Multi-Consistency -- Oral - Pill -- Oral Phase - Comment --  CHL IP PHARYNGEAL PHASE 05/26/2016 Pharyngeal Phase Impaired Pharyngeal- Pudding Teaspoon -- Pharyngeal -- Pharyngeal- Pudding Cup -- Pharyngeal -- Pharyngeal- Honey Teaspoon -- Pharyngeal -- Pharyngeal- Honey Cup -- Pharyngeal --  Pharyngeal- Nectar Teaspoon -- Pharyngeal -- Pharyngeal- Nectar Cup -- Pharyngeal -- Pharyngeal- Nectar Straw -- Pharyngeal -- Pharyngeal- Thin Teaspoon -- Pharyngeal -- Pharyngeal- Thin Cup Pharyngeal residue - pyriform Pharyngeal -- Pharyngeal- Thin Straw Pharyngeal residue - pyriform;Lateral channel residue Pharyngeal -- Pharyngeal- Puree Pharyngeal residue - valleculae;Pharyngeal residue - posterior pharnyx Pharyngeal -- Pharyngeal- Mechanical Soft -- Pharyngeal -- Pharyngeal- Regular WFL Pharyngeal -- Pharyngeal- Multi-consistency -- Pharyngeal -- Pharyngeal- Pill [facility]  Pharyngeal -- Pharyngeal Comment min residuals thin in pyriforms post swallow from vallecular residue spilling to pyriforms with decreased awareness- cued swallow clears   CHL IP CERVICAL ESOPHAGEAL PHASE 05/26/2016 Cervical Esophageal Phase Impaired Pudding Teaspoon -- Pudding Cup -- Honey Teaspoon -- Honey Cup -- Nectar Teaspoon -- Nectar Cup -- Nectar Straw -- Thin Teaspoon -- Thin Cup -- Thin Straw -- Puree Other (Comment) Mechanical Soft -- Regular -- Multi-consistency -- Pill -- Cervical Esophageal Comment --   Thank you,  Genene Churn, CCC-SLP [phone]  PORTER,DABNEY 05/26/2016, 3:23 PM      Microbiology: Recent Results (from the past 240 hour(s))  Culture, blood (Routine X 2) w Reflex to ID Panel     Status: Abnormal   Collection Time: 05/24/16 12:30 PM  Result Value Ref Range Status   Specimen Description BLOOD BLOOD LEFT ARM  Final   Special Requests BOTTLES DRAWN AEROBIC AND ANAEROBIC [facility] EACH  Final   Culture  Setup Time   Final    Anaerobic bottle recovered Gram Positive Cocci Gram Stain Report Called to,Read Back By and Verified With: White,M at 845am by H Flynt  05/25/16 CRITICAL RESULT CALLED TO, READ BACK BY AND VERIFIED WITH: C. KNIGHT, RN (APH) AT [place] ON 05/25/16 BY C. JESSUP, MLT.    Culture (A)  Final    STAPHYLOCOCCUS SPECIES (COAGULASE NEGATIVE) THE SIGNIFICANCE OF ISOLATING THIS ORGANISM FROM A  SINGLE SET OF BLOOD CULTURES WHEN MULTIPLE SETS ARE DRAWN IS UNCERTAIN. PLEASE NOTIFY THE MICROBIOLOGY DEPARTMENT WITHIN ONE WEEK IF SPECIATION AND SENSITIVITIES ARE REQUIRED. Performed at [facility]    Report Status 05/27/2016 FINAL  Final  Culture, blood (Routine X 2) w Reflex to ID Panel     Status: None   Collection Time: 05/24/16 12:30 PM  Result Value Ref Range Status   Specimen Description BLOOD RIGHT ANTECUBITAL  Final   Special Requests BOTTLES DRAWN AEROBIC AND ANAEROBIC [place]  Final   Culture NO GROWTH 5 DAYS  Final   Report Status 05/29/2016 FINAL  Final  Blood Culture ID Panel (Reflexed)     Status: Abnormal   Collection Time: 05/24/16 12:30 PM  Result Value Ref Range Status   Enterococcus species NOT DETECTED NOT DETECTED Final   Listeria monocytogenes NOT DETECTED NOT DETECTED Final   Staphylococcus species DETECTED (A) NOT DETECTED Final    Comment: CRITICAL RESULT CALLED TO, READ BACK BY AND VERIFIED WITH: C. KNIGHT, RN (APH) AT [place] ON 05/25/16 BY C. JESSUP, MLT.    Staphylococcus aureus NOT DETECTED NOT DETECTED Final   Methicillin resistance NOT DETECTED NOT DETECTED Final   Streptococcus species NOT DETECTED NOT DETECTED Final   Streptococcus agalactiae NOT DETECTED NOT DETECTED Final   Streptococcus pneumoniae NOT DETECTED NOT DETECTED Final   Streptococcus pyogenes NOT DETECTED NOT DETECTED Final   Acinetobacter baumannii NOT DETECTED NOT DETECTED Final   Enterobacteriaceae species NOT DETECTED NOT DETECTED Final   Enterobacter cloacae complex NOT DETECTED NOT DETECTED Final   Escherichia coli NOT DETECTED NOT DETECTED Final   Klebsiella oxytoca NOT DETECTED NOT DETECTED Final   Klebsiella pneumoniae NOT DETECTED NOT DETECTED Final   Proteus species NOT DETECTED NOT DETECTED Final   Serratia marcescens NOT DETECTED NOT DETECTED Final   Haemophilus influenzae NOT DETECTED NOT DETECTED Final   Neisseria meningitidis NOT DETECTED NOT DETECTED Final    Pseudomonas aeruginosa NOT DETECTED NOT DETECTED Final   Candida albicans NOT DETECTED NOT DETECTED Final   Candida glabrata NOT DETECTED  NOT DETECTED Final   Candida krusei NOT DETECTED NOT DETECTED Final   Candida parapsilosis NOT DETECTED NOT DETECTED Final   Candida tropicalis NOT DETECTED NOT DETECTED Final    Comment: Performed at [facility]  Culture, expectorated sputum-assessment     Status: None   Collection Time: 05/28/16  9:00 AM  Result Value Ref Range Status   Specimen Description EXPECTORATED SPUTUM  Final   Special Requests NONE  Final   Sputum evaluation   Final    THIS SPECIMEN IS ACCEPTABLE. RESPIRATORY CULTURE REPORT TO FOLLOW. Performed at [facility]    Report Status 05/28/2016 FINAL  Final  Culture, respiratory (NON-Expectorated)     Status: None   Collection Time: 05/28/16  9:00 AM  Result Value Ref Range Status   Specimen Description EXPECTORATED SPUTUM  Final   Special Requests NONE  Final   Gram Stain   Final    MODERATE WBC PRESENT, PREDOMINANTLY PMN FEW SQUAMOUS EPITHELIAL CELLS PRESENT MODERATE GRAM POSITIVE COCCI IN CLUSTERS FEW GRAM POSITIVE RODS FEW GRAM NEGATIVE RODS FEW GRAM POSITIVE COCCI IN PAIRS FEW GRAM NEGATIVE COCCOBACILLI    Culture   Final    Consistent with normal respiratory flora. Performed at [facility]    Report Status 05/30/2016 FINAL  Final  MRSA PCR Screening     Status: None   Collection Time: 05/31/16 12:22 PM  Result Value Ref Range Status   MRSA by PCR NEGATIVE NEGATIVE Final    Comment:        The GeneXpert MRSA Assay (FDA approved for NASAL specimens only), is one component of a comprehensive MRSA colonization surveillance program. It is not intended to diagnose MRSA infection nor to guide or monitor treatment for MRSA infections.      Labs: Basic Metabolic Panel:  Recent Labs Lab 05/29/16 0653 05/30/16 0641 05/31/16 0921 06/01/16 0552 06/02/16 0600  NA 138 136 133* 136 137   K 2.9* 3.8 3.9 3.5 4.0  CL 105 104 103 103 104  CO2 25 22 22 25 25   GLUCOSE 110* 108* 160* 108* 115*  BUN 8 10 11 12 10   CREATININE 0.66 0.72 0.81 0.89 0.85  CALCIUM 8.2* 8.6* 8.7* 8.4* 8.6*   Liver Function Tests: No results for input(s): AST, ALT, ALKPHOS, BILITOT, PROT, ALBUMIN in the last 168 hours. No results for input(s): LIPASE, AMYLASE in the last 168 hours. No results for input(s): AMMONIA in the last 168 hours. CBC:  Recent Labs Lab 05/29/16 0653 05/30/16 0641 05/31/16 0921 06/01/16 0552 06/02/16 0600  WBC 10.3 11.7* 15.1* 13.5* 14.6*  HGB 12.1* 12.6* 13.2 11.8* 13.3  HCT 36.1* 37.4* 38.9* 36.3* 40.1  MCV 90.3 90.6 90.7 91.7 91.8  PLT 340 405* 452* 504* 531*   Cardiac Enzymes: No results for input(s): CKTOTAL, CKMB, CKMBINDEX, TROPONINI in the last 168 hours. BNP: BNP (last 3 results) No results for input(s): BNP in the last 8760 hours.  ProBNP (last 3 results) No results for input(s): PROBNP in the last 8760 hours.  CBG:  Recent Labs Lab 06/02/16 0755 06/02/16 1143 06/02/16 1642 06/02/16 2053 06/03/16 0825  GLUCAP 112* 141* 90 166* 120*       Signed:  HERNANDEZ ACOSTA,Marica Trentham  [facility] Pager: [phone] 06/03/2016, 11:03 AM

## 2016-06-03 NOTE — Progress Notes (Signed)
IV discontinued. Discharge instructions given - verbalizes understanding.

## 2016-06-04 ENCOUNTER — Telehealth: Payer: Self-pay | Admitting: Pulmonary Disease

## 2016-06-04 NOTE — Telephone Encounter (Signed)
Spoke with pt. States that he was discharged from the hospital with PNA. He was offered oxygen at discharge but he declined. Now he feels he needs it. Advised him that we can prescribe oxygen without doing the pertinent testing. He has been scheduled to see TP on 06/07/16 at 9:45am. Nothing further was needed.

## 2016-06-07 ENCOUNTER — Encounter: Payer: Self-pay | Admitting: Adult Health

## 2016-06-07 ENCOUNTER — Ambulatory Visit (INDEPENDENT_AMBULATORY_CARE_PROVIDER_SITE_OTHER): Payer: Medicare Other | Admitting: Adult Health

## 2016-06-07 ENCOUNTER — Ambulatory Visit (INDEPENDENT_AMBULATORY_CARE_PROVIDER_SITE_OTHER)
Admission: RE | Admit: 2016-06-07 | Discharge: 2016-06-07 | Disposition: A | Discharge status: Home / Self Care | Payer: Medicare Other | Source: Ambulatory Visit | Attending: Adult Health | Admitting: Adult Health

## 2016-06-07 VITALS — BP 120/60 | HR 88 | Temp 98.0°F | Ht 68.0 in | Wt 152.6 lb

## 2016-06-07 DIAGNOSIS — J181 Lobar pneumonia, unspecified organism: Secondary | ICD-10-CM | POA: Diagnosis not present

## 2016-06-07 DIAGNOSIS — R918 Other nonspecific abnormal finding of lung field: Secondary | ICD-10-CM | POA: Diagnosis not present

## 2016-06-07 DIAGNOSIS — A419 Sepsis, unspecified organism: Secondary | ICD-10-CM

## 2016-06-07 DIAGNOSIS — J189 Pneumonia, unspecified organism: Secondary | ICD-10-CM | POA: Diagnosis not present

## 2016-06-07 DIAGNOSIS — I2510 Atherosclerotic heart disease of native coronary artery without angina pectoris: Secondary | ICD-10-CM

## 2016-06-07 DIAGNOSIS — J449 Chronic obstructive pulmonary disease, unspecified: Secondary | ICD-10-CM | POA: Diagnosis not present

## 2016-06-07 NOTE — Progress Notes (Signed)
Subjective:    Patient ID: Gregory Eaton, male    DOB: Apr 19, 1946, 70 y.o.   MRN: [id number]  HPI 70 yo male former smoker followed for COPD/Emphysema and lung nodule  Hx of esophageal cancer s/p esophagectomy   TEST   CT Chest 11/06/14 at [facility] showed COPD/emphysema, unchanged 29mm RLL nodule, no new lesions, post-op changes after esophagectomy & gastric pull thru...  Spirometry 01/30/15 showed FVC=3.45 (82%), FEV1=1.76 (54%), %1sec=51, mid-flows reduced at 23% predicted;  Mod severe airflow obstruction w/ GOLD Stage 2-3 COPD....    06/07/2016 Post Hospital follow up : Extended OV  Patient returns for a post hospital follow-up. Patient was recently admitted to the hospital  November 7 through 06/03/2016 for sepsis secondary to aspiration pneumonia. Chest x-ray showed a right-sided pneumonia .He was treated with aggressive IV antibiotics, and transition to Augmentin at discharge. Also started on oxygen at discharge but pt declined.  Says night before admission he got severe reflux and vomited. Then got very sick next day.  Since discharge he is feeling better . Energy level and appetite are picking up .  Walk test in office w/ minimal desats around 88-89% walking fast pace with quick recovery  Has few days of abx left.  She denies any chest pain, orthopnea, PND, or increased leg swelling   Past Medical History:  Diagnosis Date  . Adenomatous colon polyp 03/2009   Last colonoscopy by Dr. [name]   . Adenomatous polyp 2010  . Adenomatous polyp of colon 11/03/2010  . Barrett's esophagus   . CAD (coronary artery disease)   . COPD (chronic obstructive pulmonary disease) (HCC)    Severe emphysema per CT  . Diverticulosis   . DM type 2 (diabetes mellitus, type 2) ([place]) 04/09/2014   A1c 6.5 (04/08/14)   . Esophageal carcinoma ([place]) 03/2009   T1N1M0  . GERD (gastroesophageal reflux disease)   . History of Doppler ultrasound 11/09/2011   03/2014- 50-69% L ICA stenosis;carotid doppler; L bulb/prox  ICA 0-49% diameter reduction; L vertebral artery - occlusive ds; L ECA  demonstrates severe amount of fibrous plaque  . History of Doppler ultrasound 11/09/11   LEAs; R ABI - mod art. insuff.; L ABI normal at rest; R SFA - occlusive ds; L SFA - occlusive ds; patent fem-pop graft  . History of echocardiogram 08/27/2009   EF >55%  . History of nuclear stress test 11/24/2011   lexiscan; normal perfusion; low risk scan; non-diagnostic for ischemia  . Hyperlipidemia   . Hypertension   . Left carotid artery stenosis 04/08/2014  . Pulmonary nodule, right 04/08/2014   2.8 mm-incidental finding on CT  . PVD (peripheral vascular disease) ([place])   . Tachyarrhythmia 1999   Status post ablation at [facility]  . Tobacco abuse    Current Outpatient Prescriptions on File Prior to Visit  Medication Sig Dispense Refill  . albuterol (PROVENTIL) (2.5 MG/3ML) 0.083% nebulizer solution Take 3 mLs (2.5 mg total) by nebulization every 6 (six) hours as needed for wheezing or shortness of breath. Patient will also need nebulizer machine please 75 mL 12  . ALPRAZolam (XANAX) 0.5 MG tablet Take 1 tablet (0.5 mg total) by mouth at bedtime. 30 tablet 0  . amLODipine (NORVASC) 5 MG tablet TAKE ONE (1) TABLET BY MOUTH EVERY DAY 90 tablet 3  . amoxicillin-clavulanate (AUGMENTIN) 875-125 MG tablet Take 1 tablet by mouth 2 (two) times daily. 14 tablet 0  . aspirin 81 MG EC tablet Take 81 mg by mouth  daily.      . clopidogrel (PLAVIX) 75 MG tablet TAKE ONE (1) TABLET BY MOUTH EVERY DAY 30 tablet 6  . latanoprost (XALATAN) 0.005 % ophthalmic solution Place 1 drop into both eyes at bedtime.     Marland Kitchen losartan (COZAAR) 50 MG tablet Take 1.5 tablets (75 mg total) by mouth daily. 135 tablet 0  . metoprolol succinate (TOPROL-XL) 50 MG 24 hr tablet Take 1 tablet (50 mg total) by mouth daily. 90 tablet 0  . montelukast (SINGULAIR) 10 MG tablet Take 1 tablet by mouth daily.    . Multiple Vitamins-Minerals (CENTRUM SILVER PO) Take 1 tablet by mouth  daily.      . nitroGLYCERIN (NITROSTAT) 0.4 MG SL tablet Place 1 tablet (0.4 mg total) under the tongue every 5 (five) minutes as needed for chest pain. 25 tablet 3  . pantoprazole (PROTONIX) 40 MG tablet TAKE ONE (1) TABLET EACH DAY 30 tablet 11  . ranitidine (ZANTAC) 150 MG tablet Take 150 mg by mouth at bedtime.    . rosuvastatin (CRESTOR) 10 MG tablet TAKE ONE (1) TABLET BY MOUTH EVERY DAY 90 tablet 0  . sucralfate (CARAFATE) 1 G tablet Take 1 tablet (1 g total) by mouth 4 (four) times daily -  with meals and at bedtime. 40 tablet 0  . timolol (TIMOPTIC) 0.5 % ophthalmic solution Place 1 drop into both eyes daily.     Marland Kitchen tiotropium (SPIRIVA) 18 MCG inhalation capsule Place 1 capsule (18 mcg total) into inhaler and inhale daily. 30 capsule 6   No current facility-administered medications on file prior to visit.      Review of Systems     .Constitutional:   No  weight loss, night sweats,  Fevers, chills,  +fatigue, or  lassitude.  HEENT:   No headaches,  Difficulty swallowing,  Tooth/dental problems, or  Sore throat,                No sneezing, itching, ear ache, nasal congestion, post nasal drip,   CV:  No chest pain,  Orthopnea, PND, swelling in lower extremities, anasarca, dizziness, palpitations, syncope.   GI  No heartburn, indigestion, abdominal pain, nausea, vomiting, diarrhea, change in bowel habits, loss of appetite, bloody stools.   Resp:  .  No chest wall deformity  Skin: no rash or lesions.  GU: no dysuria, change in color of urine, no urgency or frequency.  No flank pain, no hematuria   MS:  No joint pain or swelling.  No decreased range of motion.  No back pain.  Psych:  No change in mood or affect. No depression or anxiety.  No memory loss.      Objective:   Physical Exam Vitals:   06/07/16 1008  BP: 120/60  Pulse: 88  Temp: 98 F (36.7 C)  TempSrc: Oral  SpO2: 92%  Weight: 152 lb 9.6 oz (69.2 kg)  Height: 5\' 8"  (1.727 m)   GEN: A/Ox3; pleasant ,  NAD, elderly    HEENT:  [place]/AT,  EACs-clear, TMs-wnl, NOSE-clear, THROAT-clear, no lesions, no postnasal drip or exudate noted.   NECK:  Supple w/ fair ROM; no JVD; normal carotid impulses w/o bruits; no thyromegaly or nodules palpated; no lymphadenopathy.    RESP  Clear  P & A; w/o, wheezes/ rales/ or rhonchi. no accessory muscle use, no dullness to percussion  CARD:  RRR, no m/r/g  , no peripheral edema, pulses intact, no cyanosis or clubbing.  GI:   Soft & nt; nml  bowel sounds; no organomegaly or masses detected.   Musco: Warm bil, no deformities or joint swelling noted.   Neuro: alert, no focal deficits noted.    Skin: Warm, no lesions or rashes  Mattheus Rauls NP-C  [facility]  06/07/2016        Assessment & Plan:

## 2016-06-07 NOTE — Patient Instructions (Addendum)
Chest xray today  Finish Augmentin .  Mucinex DM Twice daily As needed  Cough/congestion  follow up Dr. Lenna Gilford  In Jan as planned Please contact office for sooner follow up if symptoms do not improve or worsen or seek emergency care

## 2016-06-07 NOTE — Assessment & Plan Note (Addendum)
Compensated without flare   Plan  Patient Instructions  Chest xray today  Finish Augmentin .  Mucinex DM Twice daily As needed  Cough/congestion  follow up Dr. Lenna Gilford  In Jan as planned Please contact office for sooner follow up if symptoms do not improve or worsen or seek emergency care

## 2016-06-07 NOTE — Assessment & Plan Note (Signed)
Probable Aspiration PNA  Clinically improving  GERD and aspiration precautions discussed   Plan Patient Instructions  Chest xray today  Finish Augmentin .  Mucinex DM Twice daily As needed  Cough/congestion  follow up Dr. Lenna Gilford  In Jan as planned Please contact office for sooner follow up if symptoms do not improve or worsen or seek emergency care

## 2016-06-22 DIAGNOSIS — I10 Essential (primary) hypertension: Secondary | ICD-10-CM | POA: Diagnosis not present

## 2016-06-22 DIAGNOSIS — Z6823 Body mass index (BMI) 23.0-23.9, adult: Secondary | ICD-10-CM | POA: Diagnosis not present

## 2016-06-22 DIAGNOSIS — I252 Old myocardial infarction: Secondary | ICD-10-CM | POA: Diagnosis not present

## 2016-06-22 DIAGNOSIS — J449 Chronic obstructive pulmonary disease, unspecified: Secondary | ICD-10-CM | POA: Diagnosis not present

## 2016-06-22 DIAGNOSIS — I2510 Atherosclerotic heart disease of native coronary artery without angina pectoris: Secondary | ICD-10-CM | POA: Diagnosis not present

## 2016-06-22 DIAGNOSIS — K210 Gastro-esophageal reflux disease with esophagitis: Secondary | ICD-10-CM | POA: Diagnosis not present

## 2016-06-22 DIAGNOSIS — E785 Hyperlipidemia, unspecified: Secondary | ICD-10-CM | POA: Diagnosis not present

## 2016-06-23 ENCOUNTER — Encounter: Payer: Self-pay | Admitting: Cardiovascular Disease

## 2016-06-23 ENCOUNTER — Ambulatory Visit (INDEPENDENT_AMBULATORY_CARE_PROVIDER_SITE_OTHER): Payer: Medicare Other | Admitting: Cardiovascular Disease

## 2016-06-23 VITALS — BP 142/82 | HR 86 | Ht 68.0 in | Wt 153.5 lb

## 2016-06-23 DIAGNOSIS — I2510 Atherosclerotic heart disease of native coronary artery without angina pectoris: Secondary | ICD-10-CM | POA: Diagnosis not present

## 2016-06-23 DIAGNOSIS — I10 Essential (primary) hypertension: Secondary | ICD-10-CM

## 2016-06-23 DIAGNOSIS — J439 Emphysema, unspecified: Secondary | ICD-10-CM | POA: Diagnosis not present

## 2016-06-23 DIAGNOSIS — I739 Peripheral vascular disease, unspecified: Secondary | ICD-10-CM | POA: Diagnosis not present

## 2016-06-23 DIAGNOSIS — E785 Hyperlipidemia, unspecified: Secondary | ICD-10-CM | POA: Diagnosis not present

## 2016-06-23 MED ORDER — METOPROLOL SUCCINATE ER 50 MG PO TB24: 50.0000 mg | ORAL_TABLET | Freq: Every day | ORAL | 11 refills | Status: DC

## 2016-06-23 MED ORDER — LOSARTAN POTASSIUM 100 MG PO TABS: 100.0000 mg | ORAL_TABLET | Freq: Every day | ORAL | 11 refills | Status: DC

## 2016-06-23 MED ORDER — CLOPIDOGREL BISULFATE 75 MG PO TABS: ORAL_TABLET | ORAL | 6 refills | Status: DC

## 2016-06-23 MED ORDER — ROSUVASTATIN CALCIUM 10 MG PO TABS: ORAL_TABLET | ORAL | 11 refills | Status: DC

## 2016-06-23 MED ORDER — AMLODIPINE BESYLATE 5 MG PO TABS: ORAL_TABLET | ORAL | 11 refills | Status: DC

## 2016-06-23 NOTE — Patient Instructions (Signed)
Your physician has recommended you make the following change in your medication:   1.) the losartan has been increased from 50 mg to 100 mg daily.   Your physician wants you to follow-up in: 6 months or sooner if needed. You will receive a reminder letter in the mail two months in advance. If you don't receive a letter, please call our office to schedule the follow-up appointment.

## 2016-06-25 NOTE — Progress Notes (Signed)
Patient ID: Gregory Eaton, male   DOB: 07-04-1946, 70 y.o.   MRN: [id number]   PCP: Dr. Wende Neighbors  HPI: Gregory Eaton is a 70 y.o. male presents to the office today for an 87 month cardiology evaluation.  Gregory Eaton has CAD and underwent CABG revascularization surgery by Dr. Nils Pyle in 1998. In December 2010 he underwent esophageal cancer surgery at [facility]. Additional problems include hypertension as well as hyperlipidemia in addition to peripheral vascular disease. He has documented occluded left vertebral artery with normal antegrade flow the left external carotid and did have mild carotid stenoses are noted on his last Doppler study in detecting April 2013. He also is status post femoropopliteal bypass surgery and has occlusive disease in his right SFA.   A followup nuclear perfusion study on 07/24/2013 continued to reveal normal perfusion without scar or ischemia, 17 years after his CABG revascularization surgery. He had also undergone followup carotid studies which again showed an occluded left vertebral artery and mild disease in his right and left internal carotid system with narrowings less than 49%. He also is status post femoropopliteal bypass surgery. His last LE Doppler study from 07/10/2013 showed ABIs of 1.4 bilaterally at his ankles. He did have occlusive disease with reconstitution above the knee and the right SFA. His ABI values were now noncompressible.  Laboratory in August 2015 by his primary physician revealed mildly increased SGPT at 71 and an elevated calcium of 11.6.  He was hospitalized in September 2015 with pneumonia.  Followup calcium level was normal at 9.2.  LFTs were normal.  He did have a followup carotid duplex scan, which again showed a 50-69% stenosis of the left internal carotid artery.  Additionally, there was preocclusive stenosis and left external carotid.  Since I last saw him, he was recently hospitalized from November 7 - June 03, 2016 and was  felt to have sepsis due to pneumonia.  He initially was treated with vancomycin and Zosyn. He has a history of remote esophageal cancer and is status post surgery.  He has GERD which has been controlled with Protonix.  He denies any chest pain and continues to be on dual antiplatelet therapy.  He has been on amlodipine 5 mg, losartan 75 mg, Toprol 50 mg for hypertension  With his history of lung disease.  He is on Spiriva and  Singulair therapy for COPD.  He continues to be on crestor for hyperlipidemia. He presents for follow up evaluation.  Past Medical History:  Diagnosis Date  . Adenomatous colon polyp 03/2009   Last colonoscopy by Dr. [name]   . Adenomatous polyp 2010  . Adenomatous polyp of colon 11/03/2010  . Barrett's esophagus   . CAD (coronary artery disease)   . COPD (chronic obstructive pulmonary disease) (HCC)    Severe emphysema per CT  . Diverticulosis   . DM type 2 (diabetes mellitus, type 2) ([place]) 04/09/2014   A1c 6.5 (04/08/14)   . Esophageal carcinoma ([place]) 03/2009   T1N1M0  . GERD (gastroesophageal reflux disease)   . History of Doppler ultrasound 11/09/2011   03/2014- 50-69% L ICA stenosis;carotid doppler; L bulb/prox ICA 0-49% diameter reduction; L vertebral artery - occlusive ds; L ECA  demonstrates severe amount of fibrous plaque  . History of Doppler ultrasound 11/09/11   LEAs; R ABI - mod art. insuff.; L ABI normal at rest; R SFA - occlusive ds; L SFA - occlusive ds; patent fem-pop graft  . History of echocardiogram 08/27/2009  EF >55%  . History of nuclear stress test 11/24/2011   lexiscan; normal perfusion; low risk scan; non-diagnostic for ischemia  . Hyperlipidemia   . Hypertension   . Left carotid artery stenosis 04/08/2014  . Pulmonary nodule, right 04/08/2014   2.8 mm-incidental finding on CT  . PVD (peripheral vascular disease) ([place])   . Tachyarrhythmia 1999   Status post ablation at [facility]  . Tobacco abuse     Past Surgical History:  Procedure Laterality Date   . COLONOSCOPY  03/17/2009   Dr.Rourk- normal rectum, sigmoid diverticula, some pale sigmoid mucosa with diffuse petechiae. pedunculated polyp at the splenic flexure, remainder of colonic mucosa appeared normal. bx= adenomatous polyp  . COLONOSCOPY  04/19/2003   Dr.Rehman- few diverticu;a at the sigmoid colon, 3 small polyps, one at the transverse colon and 2 at the sigmoid, small external hemorrhoids. bx report not available.   . CORONARY ARTERY BYPASS GRAFT  1998   [name]  . ESOPHAGECTOMY  2010   [facility] Dr. [name]  . ESOPHAGOGASTRODUODENOSCOPY  11/25/2010   Dr.Rourk- s/p esophagectomy with gastric pull-up, esophageal erosions straddling the surgical anastomosis, salmon colored epithelium coming up a good centimeter to a centimeter and a half above the suture line, islands of salmon colored epithelium in the most poximal residual esophagus, remainder of gastric mucosa appeared normal. bx= swamous &gastric glandular mucosa w/chronic active inflammation  . ESOPHAGOGASTRODUODENOSCOPY  03/17/2009   Dr.Rourk- 4cm segment of salmon-colored epithlium distal esophagus suspicious for barretts esophagus. area of suspicious nodularity w/in this segment bx seperately. small to moderate size hiatal hernia, o/w normal stomach D1 and D2 bx=adenocarcinoma  . FEMORAL-POPLITEAL BYPASS GRAFT  1993   occlusive ds in R SFA  . GASTRIC PULL THROUGH  2010   With esophagectomy  . LIPOMA EXCISION  2010    Allergies  Allergen Reactions  . Altace [Ramipril] Cough  . Prednisone Other (See Comments)    "throat broke out"     Current Outpatient Prescriptions  Medication Sig Dispense Refill  . albuterol (PROVENTIL) (2.5 MG/3ML) 0.083% nebulizer solution Take 3 mLs (2.5 mg total) by nebulization every 6 (six) hours as needed for wheezing or shortness of breath. Patient will also need nebulizer machine please 75 mL 12  . ALPRAZolam (XANAX) 0.5 MG tablet Take 1 tablet (0.5 mg total) by mouth at bedtime. 30 tablet  0  . amLODipine (NORVASC) 5 MG tablet TAKE ONE (1) TABLET BY MOUTH EVERY DAY 30 tablet 11  . amoxicillin-clavulanate (AUGMENTIN) 875-125 MG tablet Take 1 tablet by mouth 2 (two) times daily. 14 tablet 0  . aspirin 81 MG EC tablet Take 81 mg by mouth daily.      . budesonide-formoterol (SYMBICORT) 160-4.5 MCG/ACT inhaler Inhale 2 puffs into the lungs 2 (two) times daily.    . clopidogrel (PLAVIX) 75 MG tablet TAKE ONE (1) TABLET BY MOUTH EVERY DAY 30 tablet 6  . latanoprost (XALATAN) 0.005 % ophthalmic solution Place 1 drop into both eyes at bedtime.     . metoprolol succinate (TOPROL-XL) 50 MG 24 hr tablet Take 1 tablet (50 mg total) by mouth daily. 30 tablet 11  . montelukast (SINGULAIR) 10 MG tablet Take 1 tablet by mouth daily.    . Multiple Vitamins-Minerals (CENTRUM SILVER PO) Take 1 tablet by mouth daily.      . nitroGLYCERIN (NITROSTAT) 0.4 MG SL tablet Place 1 tablet (0.4 mg total) under the tongue every 5 (five) minutes as needed for chest pain. 25 tablet 3  .  pantoprazole (PROTONIX) 40 MG tablet TAKE ONE (1) TABLET EACH DAY 30 tablet 11  . ranitidine (ZANTAC) 150 MG tablet Take 150 mg by mouth at bedtime.    . rosuvastatin (CRESTOR) 10 MG tablet TAKE ONE (1) TABLET BY MOUTH EVERY DAY 30 tablet 11  . sucralfate (CARAFATE) 1 G tablet Take 1 tablet (1 g total) by mouth 4 (four) times daily -  with meals and at bedtime. 40 tablet 0  . timolol (TIMOPTIC) 0.5 % ophthalmic solution Place 1 drop into both eyes daily.     Marland Kitchen tiotropium (SPIRIVA) 18 MCG inhalation capsule Place 1 capsule (18 mcg total) into inhaler and inhale daily. 30 capsule 6  . losartan (COZAAR) 100 MG tablet Take 1 tablet (100 mg total) by mouth daily. 30 tablet 11   No current facility-administered medications for this visit.     Socially he is widowed has 2 children. He quit smoking in 1997.  ROS General: Negative; No fevers, chills, or night sweats;  HEENT: Negative; No changes in vision or hearing, sinus congestion,  difficulty swallowing Pulmonary: Positive for  COPD , pulmonary nodule; and recent pneumonia. Cardiovascular: Negative; No chest pain, presyncope, syncope, palpitations GI: Positive for GERD; No nausea, vomiting, diarrhea, or abdominal pain GU: Negative; No dysuria, hematuria, or difficulty voiding Musculoskeletal: Negative; no myalgias, joint pain, or weakness Hematologic/Oncology: Negative; no easy bruising, bleeding Endocrine: Negative; no heat/cold intolerance; no diabetes Neuro: Negative; no changes in balance, headaches Skin: Negative; No rashes or skin lesions Psychiatric: Negative; No behavioral problems, depression Sleep: Negative; No snoring, daytime sleepiness, hypersomnolence, bruxism, restless legs, hypnogognic hallucinations, no cataplexy   PE BP (!) 142/82 (BP Location: Left Arm, Patient Position: Sitting, Cuff Size: Normal)   Pulse 86   Ht '5\' 8"'$  (1.727 m)   Wt 153 lb 8 oz (69.6 kg)   BMI 23.34 kg/m   Repeat blood pressure by me 140/80  Wt Readings from Last 3 Encounters:  06/23/16 153 lb 8 oz (69.6 kg)  06/07/16 152 lb 9.6 oz (69.2 kg)  05/25/16 158 lb (71.7 kg)   General: Alert, oriented, no distress.  Skin: normal turgor, no rashes HEENT: Normocephalic, atraumatic. Pupils round and reactive; sclera anicteric;no lid lag.  Nose without nasal septal hypertrophy Mouth/Parynx benign; Mallinpatti scale 2 Neck: No JVD, soft carotid bruits; normal carotid upstrokes. Lungs: clear to ausculatation and percussion; no wheezing or rales Chest wall: Nontender to palpation Heart: RRR, s1 s2 normal 1/6 systolic murmur; no diastolic murmur.  No rubs, thrills or heaves Back: No CVA tenderness. Abdomen: soft, nontender; no hepatosplenomehaly, BS+; abdominal aorta nontender and not dilated by palpation. Pulses 2+ with exception of distally which were reduced bilaterally. Extremities: no clubbing cyanosis or edema, Homan's sign negative  Neurologic: grossly  nonfocal Psychological: Normal affect and mood.  ECG (independently read by me): normal sinus rhythm at 86 bpm.  Right bundle branch block with repolarization changes.  Small Q wave inferiorly.  November 2016 ECG (independently read by me):  Normal sinus rhythm with right bundle branch block and repolarization changes. First-degree AV block with PR interval of 210 ms.  March 2016 ECG (independently read by me): Normal sinus rhythm at 64 bpm.  Right bundle branch block with repolarization changes.  First-degree AV block with a PR interval of 228 ms.  September 2015 ECG (independently read by me): Sinus rhythm with first-degree AV block.  PR interval 214 ms.  Right bundle branch block with repolarization changes.  January 2015 ECG (independently read by  me): Sinus rhythm 86 beats per minute. PR interval 200 ms.  Prior ECG of July 2014 :Sinus rhythm at 70 beats per minute. First degree AV block. 1 isolated unifocal PVC.  LABS:  BMP Latest Ref Rng & Units 06/02/2016 06/01/2016 05/31/2016  Glucose 65 - 99 mg/dL 115(H) 108(H) 160(H)  BUN 6 - 20 mg/dL '10 12 11  '$ Creatinine 0.61 - 1.24 mg/dL 0.85 0.89 0.81  Sodium 135 - 145 mmol/L 137 136 133(L)  Potassium 3.5 - 5.1 mmol/L 4.0 3.5 3.9  Chloride 101 - 111 mmol/L 104 103 103  CO2 22 - 32 mmol/L '25 25 22  '$ Calcium 8.9 - 10.3 mg/dL 8.6(L) 8.4(L) 8.7(L)    Hepatic Function Latest Ref Rng & Units 05/25/2016 04/07/2014 03/15/2014  Total Protein 6.5 - 8.1 g/dL 6.8 7.7 6.9  Albumin 3.5 - 5.0 g/dL 3.1(L) 4.1 3.7  AST 15 - 41 U/L 14(L) 20 15  ALT 17 - 63 U/L 13(L) 19 15  Alk Phosphatase 38 - 126 U/L 63 89 77  Total Bilirubin 0.3 - 1.2 mg/dL 0.9 0.5 0.5    CBC Latest Ref Rng & Units 06/02/2016 06/01/2016 05/31/2016  WBC 4.0 - 10.5 K/uL 14.6(H) 13.5(H) 15.1(H)  Hemoglobin 13.0 - 17.0 g/dL 13.3 11.8(L) 13.2  Hematocrit 39.0 - 52.0 % 40.1 36.3(L) 38.9(L)  Platelets 150 - 400 K/uL 531(H) 504(H) 452(H)    Lab Results  Component Value Date   MCV 91.8  06/02/2016   MCV 91.7 06/01/2016   MCV 90.7 05/31/2016   Lab Results  Component Value Date   TSH 1.850 04/07/2014    BNP    Component Value Date/Time   PROBNP 190.6 (H) 04/07/2014 1600    Lipid Panel     Component Value Date/Time   CHOL 96 01/31/2013 0940   TRIG 78 01/31/2013 0940   HDL 37 (L) 01/31/2013 0940   CHOLHDL 2.6 01/31/2013 0940   VLDL 16 01/31/2013 0940   LDLCALC 43 01/31/2013 0940     RADIOLOGY: No results found.    ASSESSMENT AND PLAN: Gregory Eaton is a 70 year old gentleman with both CAD as well as PVD. He is s/p CABG revascularization surgery in 1998.  His last nuclear perfusion study continues to suggest patency of his grafts and this was without scar or ischemia. His CAD appears stable and he is not having anginal symptoms on current therapy. His last carotid ultrasound  on 04/07/2014 showed bilateral atherosclerotic plaque in the carotid bulb and internal carotid arteries with focal 60-69% stenosis in the left internal carotid.  Preocclusive narrowing was noted in the left external carotid.  His left vertebral artery was not visualized on the most recent study.  He has occlusive disease in his right SFA but apparently continues to do well, status post femoropopliteal bypass surgery with ABIs at 1.4 which now are noncompressible. eviewed his recent hospitalization records when he presented with pneumonia and sepsis and was hospitalized for 9 days.  His blood pressure today is elevated and I am further titrating losartan to 100 mg daily.  He will continue to take Toprol 50 mg and amlodipine 5 mg.  There is no wheezing on exam today on Symbicort, Spiriva, and Singulair. He is on Crestor for hyperlipidemia with target LDL less than 70. He continues to be on Protonix for his GERD which is well-controlled.  He denies any new claudication symptoms regarding his PVD. Is labs are to be done by his new primary physician, Dr. Wende Neighbors.  I will try to  obtain these for my review.   He will return in 6 months for cardiology reevaluation. As long as he remains stable, I will see him in one year for reevaluation.  Time spent: 25 minutes Troy Sine, MD, [facility]  06/25/2016 10:46 PM

## 2016-07-07 DIAGNOSIS — Z1159 Encounter for screening for other viral diseases: Secondary | ICD-10-CM | POA: Diagnosis not present

## 2016-07-07 DIAGNOSIS — I10 Essential (primary) hypertension: Secondary | ICD-10-CM | POA: Diagnosis not present

## 2016-07-07 DIAGNOSIS — Z125 Encounter for screening for malignant neoplasm of prostate: Secondary | ICD-10-CM | POA: Diagnosis not present

## 2016-07-09 DIAGNOSIS — I10 Essential (primary) hypertension: Secondary | ICD-10-CM | POA: Diagnosis not present

## 2016-07-09 DIAGNOSIS — E785 Hyperlipidemia, unspecified: Secondary | ICD-10-CM | POA: Diagnosis not present

## 2016-07-09 DIAGNOSIS — J449 Chronic obstructive pulmonary disease, unspecified: Secondary | ICD-10-CM | POA: Diagnosis not present

## 2016-07-10 ENCOUNTER — Other Ambulatory Visit: Payer: Self-pay | Admitting: Cardiovascular Disease

## 2016-07-13 ENCOUNTER — Encounter: Payer: Self-pay | Admitting: Adult Health

## 2016-07-13 ENCOUNTER — Ambulatory Visit (INDEPENDENT_AMBULATORY_CARE_PROVIDER_SITE_OTHER): Payer: Medicare Other | Admitting: Adult Health

## 2016-07-13 ENCOUNTER — Ambulatory Visit (INDEPENDENT_AMBULATORY_CARE_PROVIDER_SITE_OTHER)
Admission: RE | Admit: 2016-07-13 | Discharge: 2016-07-13 | Disposition: A | Discharge status: Home / Self Care | Payer: Medicare Other | Source: Ambulatory Visit | Attending: Adult Health | Admitting: Adult Health

## 2016-07-13 VITALS — BP 126/70 | HR 88 | Ht 68.0 in | Wt 156.8 lb

## 2016-07-13 DIAGNOSIS — R05 Cough: Secondary | ICD-10-CM | POA: Diagnosis not present

## 2016-07-13 DIAGNOSIS — J449 Chronic obstructive pulmonary disease, unspecified: Secondary | ICD-10-CM | POA: Diagnosis not present

## 2016-07-13 DIAGNOSIS — I2510 Atherosclerotic heart disease of native coronary artery without angina pectoris: Secondary | ICD-10-CM

## 2016-07-13 DIAGNOSIS — J189 Pneumonia, unspecified organism: Secondary | ICD-10-CM

## 2016-07-13 DIAGNOSIS — R0602 Shortness of breath: Secondary | ICD-10-CM | POA: Diagnosis not present

## 2016-07-13 DIAGNOSIS — R079 Chest pain, unspecified: Secondary | ICD-10-CM | POA: Diagnosis not present

## 2016-07-13 MED ORDER — PREDNISONE 10 MG PO TABS: ORAL_TABLET | ORAL | 0 refills | Status: DC

## 2016-07-13 NOTE — Assessment & Plan Note (Signed)
CXR shows resolving PNA  Hold on additonal abx att this time  follow up next month as planned

## 2016-07-13 NOTE — Telephone Encounter (Signed)
REFILL 

## 2016-07-13 NOTE — Progress Notes (Signed)
Subjective:     Patient ID: Gregory Eaton, male   DOB: 1946-05-12, 70 y.o.   MRN: [id number]  HPI ~  January 30, 2015:  Initial pulmonary consult w/ SN>   59 y/o WM, referred by DrGolding in [place] for a pulmonary evaluation due to COPD/ emphysema/ & 7-1m RLL nodule on CT Chest>      CCejayrelates a history of sinus infection/ URI in April2016 & went to his PCP DrGolding, treated w/ antibiotic & Pred=> improved but didn't get over it so he was given a second antibiotic + Spiriva, Proair, Singulair => finally improved so he tells me he stopped the Spiriva "I'm better"...  He is an ex-smoker, starting in his teens, smoked for 40 yrs up to 2ppd at max for a 55-60 pack-year smoking history... 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     He has a hx HBP, HL, CAD, s/p CABG in 1998, known carotid art dis & ASPVD w/ prev Fem-Pop bypass> followed by DrManuela Neptunel & his 10/07/14 Epic note is reviewed; meds adjusted, see below...    [facility]... EXAM revealed Afeb, VSS, O2sat=95% on RA;  HEENT- neg, mallampati2;  Chest- decr BS bilat w/o w/r/r;  Heart- RR gr1/6 SEM w/o r/g;  Abd- soft, neg;  Ext- w/o c/c/e...  CT Angio Chest 04/07/14 showed neg for PE, s/p CABG, severe atherosclerosis in Ao, severe bilat emphysema, 23m17module RLL, inflamm changes in lingula, post surg changes related to esophagectomy & gastric pull thru, no signif LNs...  EKG 09/2014 showed NSR, rate64, RBBB, NAD...  [facility], unchanged 7mm24mL nodule, no new lesions, post-op changes after esophagectomy & gastric pull thru...  CXR 12/11/14 showed norm heart size, s/p CABG, COPD/ emphysema, NAD...   Spirometry 01/30/15 showed FVC=3.45 (82%),  FEV1=1.76 (54%), %1sec=51, mid-flows reduced at 23% predicted;  Mod severe airflow obstruction w/ GOLD Stage 2-3 COPD....   Ambulatory oxygen saturation test 01/30/15> O2sat=96% on RA at rest w/ pulse=67/min;  He ambulated 3 laps in the office w/ nadir O2sat=93% w/ pulse=85/min...  LABS 7/16:  Alpha-1-Antitrypsin level ==> 133 (83-199 mg/dL) and phenotype= M1M2  IMP/PLAN>>  CharAbdirahman mod severe COPD/ emphysema based on his PFTs and heavy smoking hx; he would benefit from ICS/LABA and LAMA Rx=> rec to use SYMBICORT160-2spBid & SPIRIVA daily; he will continue PROAIR-HFA for acute symptoms prn, SINGULAIR10, and MUCINEX 600mg423mfor thick phlegm/ mucus; he needs to maintain a vigorous antireflux regimen w/ his esoph dis & surg; as much as poss he will avoid infections and needs to start a vigorous exercise program (eg- silver sneakers or similar)... Prognosis is guarded w/ his signif co-morbidities (ASHD, ASPVD, Hx esoph ca w/ surg 2010, etc);  He will ret in 732mo w2732mollPFTs to check DLCO...  ~  April 03, 2015:  732mo RO37mo SN>         CharlesEncarnacions for f/u on his Advair115-2spBid, Spiriva daily, Singulair10, and ProairHFA prn;  He has had some trouble w/ cost of the meds and asked to get a copy of his insurance company prescription drug formulary for us to rKoreaiew;  He notes that his breathing is stable  overall- still mows yard & does ok, not much cough/ sput/ no hemoptysis/ no CP/ no edema;  He has some reflux issues on Protonix40 & reminded of the vigorous antireflux regimen he needs to follow including NPO after dinner, elev HOB 6", take Pepcid ~46mn before bedtime, etc... We reviewed the following medical problems during today's office visit >>     COPD/ emphysema ==> GOLD Stage 2-3 disease>  Continue Advair115-2spBid, Spiriva daily, Singulair10, ProairHFA prn; get [place] copy of his prescription drug formulary...    7-88mRLL pulmonary nodule on CT Chest>  No change noted from 2015=>2016 ([facility]- they  felt it was benign) & we discussed repeat scan in spring of 2017...    HBP>  [place] Toprol[place], Amlod5, Cozaar50; BP=130/74 & he denies CP, palpit, SOB, edema...    CAD- s/p 5 vessel CABG, RBBB>  On ASA/ Plavix; [facility]...    Carotid stenosis>  Mild bilat carotid stenoses on CDopplers    ASPVD>  s/p right Fem-Pop    Hx esophageal cancer- s/[facility]>  His GI is DrRourk, [facility] and [facility] but no CareEverywhere records are avail...    Other medical issues as noted>  See below... EXAM revealed Afeb, VSS, O2sat=97% on RA;  HEENT- neg, mallampati2;  Chest- decr BS bilat w/o w/r/r;  Heart- RR gr1/6 SEM w/o r/g;  Abd- soft, neg;  Ext- w/o c/c/e...  FullPFT 04/03/15 showed FVC=4.22 (100%), FEV1=2.16 (70%), %1sec=51, mid-flows reduced at 36% predicted; post-bronchodil there was a 3% incr in FEV1; TLC=6.80 (100%), RV=1.79 (75%), RV/TLC=26; DLCO=55% predicted... These PFTs indicate moderately severe airflow obstruction, GOLD Stage2-3 COPD, decr diffusion c/w emphysema...  IMP/PLAN>>  As prev noted- ChEthels rec to stay on the Advair,Spiriva, Proair; avoid infections, get all approp vaccinations from his PCP; follow the vigorous antireflux regimen as outlined, and plan f/u w/ usKorean about 96m58moooner if needed...   ~  September 18, 2015:  2mo90mo w/ SN>  CharMarsean a COPD exac 06/2015 & saw DrWert- he had stopped his Symbicort & Spiriva, presented w/ some congestion & wheezing, & given ZPak, Pred, Symbicort, Mucinex, Zantac;  He did well after this therapy until recent similar symptoms- cough, chest congestion, yellow sput, but he denies f/c/s, incr SOB, chest tightness/ wheezing/ etc;  He is again NOT using his Symbicort regularly as it costs him $150/mo; he has been active- using his exerc bike, caring for wife s/p TKR, yard work & push mows...     COPD/ emphysema ==> GOLD Stage 2-3  disease>  He again stopped his Symbicort & he is off the Spiriva; on Singulair10, ProairHFA prn; needs to get us aKoreaopy of his prescription drug formulary (reminded again)...    7-8mm 27m pulmonary nodule on CT Chest>  No change noted from 2015=>2016 ([facility]- they felt it was benign) & we discussed repeat CT Chest but he wants to hold-off...    He had Cards appt 05/2015 w/ DrKelly> HBP, CAD, s/pCABG 1998, ASPVD- s/p right Fem-Pop/ occluded left vertebral/ mild carotid dis, HL, [facility], no changes made. EXAM revealed Afeb, VSS, O2sat=98% on RA;  HEENT- neg, mallampati2;  Chest- decr BS bilat w/o w/r/r;  Heart- RR gr1/6 SEM w/o r/g;  Abd- soft, neg;  Ext- w/o c/c/e;  Neuro- intact... IMP/PLAN>>  I  again explained to [facility] the need for regular ICS/LABA medication- he needs to contact his insurance regarding a their prescription drug formulary, in the meanwhile we will refill the Symbicort160- 2spBid;  Rx written for ZPak w/ refills per his request...  ~  March 23, 2016:  47mo ROV w/ SN>  Adryen reports that he is doing well- no new complaints or concerns;  He has min cough Qam, sm amt clear sput (no color or blood), "it may be reflux" he says; denies SOB/ CP/ palpit/ etc; he does have chr stable DOE (no change- eg. climbing hills);  On SYMBICORT160-2spBid (taking it "most of the time", notes $45 copay now), SINGULAIR10, Mucinex- just taking this prn...  NOTE: his FEV1 was 2.16L Sep2016 & has dropped to 1.76L today => he is asked to add the [facility] daily to his med regimen (alternatives- [employer], [employer], he will check formulary),,,     See prob list above-- HBP, CAD- s/p 5vessel CABG, RBBB, ASPVD- s/p R fem-pop, bilat carotid stenoses> on ASA/PLAVIX, MetopXL50, Amlod5, Losar75;  BP stable, denies CP, palpit, ch in DOE, edema, etc...     He has hx esoph Ca, s/p esophagectomy & felt to be cured; released from [facility] follow up & followed by DrRourk ([facility]) seen  03/03/16- plans f/u EGD/ Colon soon...  EXAM revealed Afeb, VSS, O2sat=98% on RA;  HEENT- neg, mallampati2;  Chest- decr BS bilat w/o w/r/r;  Heart- RR gr1/6 SEM w/o r/g;  Abd- soft, neg;  Ext- w/o c/c/e;  Neuro- intact...  CXR 12/02/15 by DrGolding>  Norm heart size s/p CABG, mild hyperinflation, coarse interstitial markings, small HH w/ A/F level...   [facility] 12[facility]2015>  FLP- all parameters at goals;  Chems- ok w/ Cr=0.74, BS=88;  CBC- wnl...   Spirometry 03/23/16>  FVC=4.10 (101%), FEV1=1.76 (59%), %1sec=43, mid-flows reduced at 20% predicted... This is c/w a severe obstructive ventilatory defect & GOLD Stage 2 CPD... IMP/PLAN>>  His PCP is DrGolding (seen 2-3x/yr, Cards-DrKelly, GI-DrRourk;  We reviewed his deterioration in FEV1 over the last yr & rec to take the Symbicort- 2sp Bid regularly & add-in SPIRIVA once daily every day... we plan routine ROV in 82mo...  ~  May 12, 2016:  7wk ROV & add-on appt s/p Hosp in [place] for Pneumonia>  Sidharth reports that he was vacationing at [employer] w/ family when he became ill w/ chest congestion, SOB, fever to 101, chills, & weak w/ decr in BP; he went to the local [facility] & sent to [facility] for 2d he says;  He had left lung pneumonia on CXR, WBC=14.8, Sput grew NTF only, BNP=119;  He was treated w/ Levaquin and slowly improved; [place] 3 [place] to recoup & now back home to [place] & feeling much better...     COPD/ emphysema ==> GOLD Stage 2-3 disease>  He again stopped his Symbicort & he is off the Spiriva; on Singulair10, ProairHFA prn; needs to get [place] a copy of his prescription drug formulary (reminded again)...    7-49mm RLL pulmonary nodule on CT Chest>  No change noted from 2015=>2016 ([facility]- they felt it was benign) & we discussed repeat CT Chest but he wants to hold-off...    CAP, Adm for 3d in [facility] 03/2016>  No organism isolated, treated w/ Levaquin and slowly resolved back to  baseline...    CARDIAC issues>  HBP, CAD- s/p 5 vessel bypass 1998, RBBB, ASPVD- s/p right Fem-Pop/ occluded  left vertebral/ mild carotid dis>  On ASA/ Plavix, + ToprolXL50, Amlod5, Cozaar50 & followed by [facility] for CARDS...     Hx esophageal cancer- s/p esophagectomy w/ gastric pull through at [facility] in 2010>  His GI is DrRourk, [facility] and his surgery was done at [facility] in 2010 but no CareEverywhere records are avail...    Other medical issues as noted>  HBP, HL, IFG, GERD, Barrett's, Esoph Ca, colon polyps, divertics, anxiety on Xanax...  EXAM revealed Afeb, VSS, O2sat=95% on RA;  HEENT- neg, mallampati2;  Chest- decr BS bilat w/o w/r/r;  Heart- RR gr1/6 SEM w/o r/g;  Abd- soft, neg;  Ext- w/o c/c/e;  Neuro- intact...  CXR 05/12/16>  Norm heart size, s/p CABG, hyperinflation w/ COPD, linear scarring in LUL & lingula, NAD...   IMP/PLAN>>  MrHall has made a nice recovery from his recent CAP 9/17 in [place]; he is off antibiotics and rec to continue his Symbicort160-2spBid, Spiriva daily, Singulair10, Mucinex '1200mg'$  Bid fluids, etc...    Past Medical History  Diagnosis Date  . Esophageal carcinoma 03/2009    T1N1M0  . Hypertension >> on Metoprolol, Amlodipine, Losartan   . Barrett's esophagus   . GERD (gastroesophageal reflux disease) >> on Protonix40 & Carafate 1gmQid   . CAD (coronary artery disease)   . PVD (peripheral vascular disease)   . Hyperlipidemia >> on Crestor10, Fish Oil + diet   . Adenomatous colon polyp 03/2009    Last colonoscopy by Dr. [name]   . Diverticulosis   . Tobacco abuse   . Tachyarrhythmia 1999    Status post ablation at [facility]  . COPD (chronic obstructive pulmonary disease)     Severe emphysema per CT  . History of echocardiogram 08/27/2009    EF >55%  . History of nuclear stress test 11/24/2011    lexiscan; normal perfusion; low risk scan; non-diagnostic for ischemia  . History of Doppler ultrasound 11/09/2011    03/2014- 50-69% L ICA stenosis;carotid doppler;  L bulb/prox ICA 0-49% diameter reduction; L vertebral artery - occlusive ds; L ECA  demonstrates severe amount of fibrous plaque  . History of Doppler ultrasound 11/09/11    LEAs; R ABI - mod art. insuff.; L ABI normal at rest; R SFA - occlusive ds; L SFA - occlusive ds; patent fem-pop graft  . Adenomatous polyp of colon 11/03/2010  . Left carotid artery stenosis 04/08/2014  . Pulmonary nodule, right 04/08/2014    8 mm-incidental finding on CT  . DM type 2 (diabetes mellitus, type 2) >> on diet alone 04/09/2014    A1c 6.5 (04/08/14)     Past Surgical History:  Procedure Laterality Date  . COLONOSCOPY  03/17/2009   Dr.Rourk- normal rectum, sigmoid diverticula, some pale sigmoid mucosa with diffuse petechiae. pedunculated polyp at the splenic flexure, remainder of colonic mucosa appeared normal. bx= adenomatous polyp  . COLONOSCOPY  04/19/2003   Dr.Rehman- few diverticu;a at the sigmoid colon, 3 small polyps, one at the transverse colon and 2 at the sigmoid, small external hemorrhoids. bx report not available.   . CORONARY ARTERY BYPASS GRAFT  1998   [name]  . ESOPHAGECTOMY  2010   [facility] Dr. [name]  . ESOPHAGOGASTRODUODENOSCOPY  11/25/2010   Dr.Rourk- s/p esophagectomy with gastric pull-up, esophageal erosions straddling the surgical anastomosis, salmon colored epithelium coming up a good centimeter to a centimeter and a half above the suture line, islands of salmon colored epithelium in the most poximal residual esophagus, remainder of gastric mucosa appeared  normal. bx= swamous &gastric glandular mucosa w/chronic active inflammation  . ESOPHAGOGASTRODUODENOSCOPY  03/17/2009   Dr.Rourk- 4cm segment of salmon-colored epithlium distal esophagus suspicious for barretts esophagus. area of suspicious nodularity w/in this segment bx seperately. small to moderate size hiatal hernia, o/w normal stomach D1 and D2 bx=adenocarcinoma  . FEMORAL-POPLITEAL BYPASS GRAFT  1993   occlusive ds in R SFA  .  GASTRIC PULL THROUGH  2010   With esophagectomy  . LIPOMA EXCISION  2010    Outpatient Encounter Prescriptions as of 05/12/2016  Medication Sig  . ALPRAZolam (XANAX) 0.5 MG tablet Take 1 tablet (0.5 mg total) by mouth at bedtime.  Marland Kitchen aspirin 81 MG EC tablet Take 81 mg by mouth daily.    Marland Kitchen latanoprost (XALATAN) 0.005 % ophthalmic solution Place 1 drop into both eyes at bedtime.   . montelukast (SINGULAIR) 10 MG tablet Take 1 tablet by mouth daily.  . Multiple Vitamins-Minerals (CENTRUM SILVER PO) Take 1 tablet by mouth daily.    . nitroGLYCERIN (NITROSTAT) 0.4 MG SL tablet Place 1 tablet (0.4 mg total) under the tongue every 5 (five) minutes as needed for chest pain.  . pantoprazole (PROTONIX) 40 MG tablet TAKE ONE (1) TABLET EACH DAY  . ranitidine (ZANTAC) 150 MG tablet Take 150 mg by mouth at bedtime.  . sucralfate (CARAFATE) 1 G tablet Take 1 tablet (1 g total) by mouth 4 (four) times daily -  with meals and at bedtime.  . timolol (TIMOPTIC) 0.5 % ophthalmic solution Place 1 drop into both eyes daily.   Marland Kitchen tiotropium (SPIRIVA) 18 MCG inhalation capsule Place 1 capsule (18 mcg total) into inhaler and inhale daily.  . [DISCONTINUED] amLODipine (NORVASC) 5 MG tablet TAKE ONE (1) TABLET BY MOUTH EVERY DAY  . [DISCONTINUED] clopidogrel (PLAVIX) 75 MG tablet TAKE ONE (1) TABLET BY MOUTH EVERY DAY  . [DISCONTINUED] dextromethorphan-guaiFENesin (MUCINEX DM) 30-600 MG 12hr tablet Take 1 tablet by mouth 2 (two) times daily as needed for cough.  . [DISCONTINUED] losartan (COZAAR) 50 MG tablet TAKE ONE AND ONE-HALF TABLETS BY MOUTH DAILY  . [DISCONTINUED] metoprolol succinate (TOPROL-XL) 50 MG 24 hr tablet TAKE ONE (1) TABLET EACH DAY. TAKE WITH OR IMMEDIATELY FOLLOWING A MEAL.  . [DISCONTINUED] rosuvastatin (CRESTOR) 10 MG tablet TAKE ONE (1) TABLET BY MOUTH EVERY DAY  . [DISCONTINUED] SYMBICORT 160-4.5 MCG/ACT inhaler INHALE TWO PUFFS INTO THE LUNGS TWO TIMES DAILY (Patient not taking: Reported on  05/24/2016)  . [DISCONTINUED] budesonide-formoterol (SYMBICORT) 160-4.5 MCG/ACT inhaler Inhale 2 puffs into the lungs 2 (two) times daily.  . [DISCONTINUED] tiZANidine (ZANAFLEX) 4 MG tablet Take 4 mg by mouth 3 (three) times daily as needed. Muscle spasm   No facility-administered encounter medications on file as of 05/12/2016.     Allergies  Allergen Reactions  . Altace [Ramipril] Cough  . Prednisone Other (See Comments)    "throat broke out"     Current Medications, Allergies, Past Medical History, Past Surgical History, Family History, and Social History were reviewed in [employer] record.   Review of Systems            All symptoms NEG except where BOLDED >>  Constitutional:  F/C/S, fatigue, anorexia, unexpected weight change. HEENT:  HA, visual changes, hearing loss, earache, nasal symptoms, sore throat, mouth sores, hoarseness. Resp:  cough, sputum, hemoptysis; SOB, tightness, wheezing. Cardio:  CP, palpit, DOE, orthopnea, edema. GI:  N/V/D/C, blood in stool; reflux, abd pain, distention, gas. GU:  dysuria, freq, urgency, hematuria, flank  pain, voiding difficulty. MS:  joint pain, swelling, tenderness, decr ROM; neck pain, back pain, etc. Neuro:  HA, tremors, seizures, dizziness, syncope, weakness, numbness, gait abn. Skin:  suspicious lesions or skin rash. Heme:  adenopathy, bruising, bleeding. Psyche:  confusion, agitation, sleep disturbance, hallucinations, anxiety, depression suicidal.   Objective:   Physical Exam      Vital Signs:  Reviewed...   General:  WD, WN, 70 y/o WM in NAD; alert & oriented; pleasant & cooperative... HEENT:  [place]/AT; Conjunctiva- pink, Sclera- nonicteric, EOM-wnl, PERRLA, EACs-clear, TMs-wnl; NOSE-clear; THROAT-clear & wnl.  Neck:  Supple w/ fair ROM; no JVD; normal carotid impulses w/o bruits; no thyromegaly or nodules palpated; no lymphadenopathy.  Chest:  decr BS bilat, without wheezes, rales, or rhonchi  heard. Heart:  Regular Rhythm; gr1/6 SEM w/o rubs or gallops detected. Abdomen:  Soft & nontender- no guarding or rebound; normal bowel sounds; no organomegaly or masses palpated. Ext:  decrROM; without deformities +arthritic changes; no varicose veins, +venous insuffic, or edema;  Pulses intact w/o bruits. Neuro:  No focal neuro deficits; sensory testing normal; gait normal & balance OK. Derm:  No lesions noted; no rash etc. Lymph:  No cervical, supraclavicular, axillary, or inguinal adenopathy palpated.   Assessment:      IMP >>     COPD/ emphysema ==> GOLD Stage 2-3 disease    7-45m RLL pulmonary nodule on CT Chest    [place]    [place]    CAD- s/p 5 vessel CABG    Carotid stenosis    ASPVD- s/p Fem-Pop    Hx esophageal [facility]    Other medical issues as noted...   PLAN >>  01/30/15>  CJamaalhas mod severe COPD/ emphysema based on his PFTs and heaqvy smoking hx; he would benefit from ICS/LABA and LAMA Rx=> rec to use SYMBICORT160-2spBid & SPIRIVA daily; he will continue PROAIR-HFA for acute symptoms prn, SINGULAIR10, and MUCINEX '600mg'$ Qid for thick phlegm/ mucus;; he needs to maintain a vigorous antireflux regimen w/ his esoph dis & surg; as much as poss he will avoid infections and needs to start a vigorous exercise program (eg- silver sneakers or similar)... Prognosis is guarded w/ his signif co-morbidities (ASHD, ASPVD, Hx esoph ca w/ surg 2010, etc);  He will ret in 280mo/ FullPFTs to check DLCO. 04/03/15>  As prev noted- ChKouroshs rec to stay on the Advair,Spiriva, Proair; avoid infections, get all approp vaccinations from his PCP; follow the vigorous antireflux regimen as outlined, and plan f/u w/ usKorean about 50m72moooner if needed. 09/18/15>   I again explained to ChaCaidine need for regular ICS/LABA medication- he needs to contact his insurance regarding a their prescription drug formulary, in the meanwhile we  will refill the Symbicort160- 2spBid;  Rx written for ZPak w/ refills per his request. 05/12/16>   [place]Hall [place] [facility] [place], treated w/ Levaquin & made a slow but steady recovery; again requested to continue regular Rx w/ his Symbicort160-2spBid, Spiriva daily, Singulair10, Mucinex '1200mg'$  Bid fluids, etc     Plan:       Patient's Medications  New Prescriptions   ALBUTEROL (PROVENTIL) (2.5 MG/3ML) 0.083% NEBULIZER SOLUTION    Take 3 mLs (2.5 mg total) by nebulization every 6 (six) hours as needed for wheezing or shortness of breath. Patient will also need nebulizer machine please  LOSARTAN (COZAAR) 100 MG TABLET    Take 1 tablet (100 mg total) by mouth daily.   PREDNISONE (DELTASONE) 10 MG TABLET    4 tabs for 2 days, then 3 tabs for 2 days, 2 tabs for 2 days, then 1 tab for 2 days, then stop  Previous Medications   ALPRAZOLAM (XANAX) 0.5 MG TABLET    Take 1 tablet (0.5 mg total) by mouth at bedtime.   ASPIRIN 81 MG EC TABLET    Take 81 mg by mouth daily.     BUDESONIDE-FORMOTEROL (SYMBICORT) 160-4.5 MCG/ACT INHALER    Inhale 2 puffs into the lungs 2 (two) times daily.   LATANOPROST (XALATAN) 0.005 % OPHTHALMIC SOLUTION    Place 1 drop into both eyes at bedtime.    MONTELUKAST (SINGULAIR) 10 MG TABLET    Take 1 tablet by mouth daily.   MULTIPLE VITAMINS-MINERALS (CENTRUM SILVER PO)    Take 1 tablet by mouth daily.     NITROGLYCERIN (NITROSTAT) 0.4 MG SL TABLET    Place 1 tablet (0.4 mg total) under the tongue every 5 (five) minutes as needed for chest pain.   PANTOPRAZOLE (PROTONIX) 40 MG TABLET    TAKE ONE (1) TABLET EACH DAY   RANITIDINE (ZANTAC) 150 MG TABLET    Take 150 mg by mouth at bedtime.   SUCRALFATE (CARAFATE) 1 G TABLET    Take 1 tablet (1 g total) by mouth 4 (four) times daily -  with meals and at bedtime.   TIMOLOL (TIMOPTIC) 0.5 % OPHTHALMIC SOLUTION    Place 1 drop into both eyes daily.    TIOTROPIUM (SPIRIVA) 18 MCG  INHALATION CAPSULE    Place 1 capsule (18 mcg total) into inhaler and inhale daily.  Modified Medications   Modified Medication Previous Medication   AMLODIPINE (NORVASC) 5 MG TABLET amLODipine (NORVASC) 5 MG tablet      TAKE ONE TABLET BY MOUTH EVERY DAY.    TAKE ONE (1) TABLET BY MOUTH EVERY DAY   CLOPIDOGREL (PLAVIX) 75 MG TABLET clopidogrel (PLAVIX) 75 MG tablet      TAKE ONE (1) TABLET BY MOUTH EVERY DAY    TAKE ONE (1) TABLET BY MOUTH EVERY DAY   METOPROLOL SUCCINATE (TOPROL-XL) 50 MG 24 HR TABLET metoprolol succinate (TOPROL-XL) 50 MG 24 hr tablet      Take 1 tablet (50 mg total) by mouth daily.    Take 1 tablet (50 mg total) by mouth daily.   ROSUVASTATIN (CRESTOR) 10 MG TABLET rosuvastatin (CRESTOR) 10 MG tablet      TAKE ONE (1) TABLET BY MOUTH EVERY DAY    TAKE ONE (1) TABLET BY MOUTH EVERY DAY  Discontinued Medications   BUDESONIDE-FORMOTEROL (SYMBICORT) 160-4.5 MCG/ACT INHALER    Inhale 2 puffs into the lungs 2 (two) times daily.   DEXTROMETHORPHAN-GUAIFENESIN (MUCINEX DM) 30-600 MG 12HR TABLET    Take 1 tablet by mouth 2 (two) times daily as needed for cough.   LOSARTAN (COZAAR) 50 MG TABLET    TAKE ONE AND ONE-HALF TABLETS BY MOUTH DAILY   METOPROLOL SUCCINATE (TOPROL-XL) 50 MG 24 HR TABLET    TAKE ONE (1) TABLET EACH DAY. TAKE WITH OR IMMEDIATELY FOLLOWING A MEAL.   SYMBICORT 160-4.5 MCG/ACT INHALER    INHALE TWO PUFFS INTO THE LUNGS TWO TIMES DAILY   TIZANIDINE (ZANAFLEX) 4 MG TABLET    Take 4 mg by mouth 3 (three) times daily as needed. Muscle spasm

## 2016-07-13 NOTE — Progress Notes (Signed)
@Patient  ID: Gregory Eaton, male    DOB: [birth year], 70 y.o.   MRN: [id number]  Chief Complaint  Patient presents with  . Follow-up    PNA     Referring provider: Celene Squibb, MD  HPI: 70 yo male former smoker followed for COPD /Emphysema and lung nodule  Hx of esophageal caner s/p esophagectomy .   07/13/2016 Follow up : PNA  Pt returns for follow up for recent PNA .  4 days of increased cough w/ thick clear mucus , sinus drainage and wheezing /tightness . No fever or discolored mucus. No choking episodes.  Appetite is good w/ no v/d.  CXR today was reviewed independently and appears to have improved aeration and resolving PNA  Not taking mucinex right now. Remains on Symbicort and Spiriva .     TEST /Events   CT Chest 11/06/14 at [facility] showed COPD/emphysema, unchanged 68mm RLL nodule, no new lesions, post-op changes after esophagectomy & gastric pull thru.Marland Kitchen  Spirometry 01/30/15 showed FVC=3.45 (82%), FEV1=1.76 (54%), %1sec=51, mid-flows reduced at 23% predicted; Mod severe airflow obstruction w/ GOLD Stage 2-3 COPD....   Admitted 05/2016 with sepsis /Aspiration PNA , CXR w/ RUL infiltrate     Allergies  Allergen Reactions  . Altace [Ramipril] Cough  . Prednisone Other (See Comments)    "throat broke out"     Immunization History  Administered Date(s) Administered  . Influenza,inj,Quad PF,36+ Mos 04/19/2015, 04/17/2016  . Influenza-Unspecified 05/01/2014  . Pneumococcal Conjugate-13 04/01/2014  . Pneumococcal Polysaccharide-23 04/17/2016    Past Medical History:  Diagnosis Date  . Adenomatous colon polyp 03/2009   Last colonoscopy by Dr. [name]   . Adenomatous polyp 2010  . Adenomatous polyp of colon 11/03/2010  . Barrett's esophagus   . CAD (coronary artery disease)   . COPD (chronic obstructive pulmonary disease) (HCC)    Severe emphysema per CT  . Diverticulosis   . DM type 2 (diabetes mellitus, type 2) ([place]) 04/09/2014   A1c 6.5 (04/08/14)   . Esophageal  carcinoma ([place]) 03/2009   T1N1M0  . GERD (gastroesophageal reflux disease)   . History of Doppler ultrasound 11/09/2011   03/2014- 50-69% L ICA stenosis;carotid doppler; L bulb/prox ICA 0-49% diameter reduction; L vertebral artery - occlusive ds; L ECA  demonstrates severe amount of fibrous plaque  . History of Doppler ultrasound 11/09/11   LEAs; R ABI - mod art. insuff.; L ABI normal at rest; R SFA - occlusive ds; L SFA - occlusive ds; patent fem-pop graft  . History of echocardiogram 08/27/2009   EF >55%  . History of nuclear stress test 11/24/2011   lexiscan; normal perfusion; low risk scan; non-diagnostic for ischemia  . Hyperlipidemia   . Hypertension   . Left carotid artery stenosis 04/08/2014  . Pulmonary nodule, right 04/08/2014   2.8 mm-incidental finding on CT  . PVD (peripheral vascular disease) ([place])   . Tachyarrhythmia 1999   Status post ablation at [facility]  . Tobacco abuse     Tobacco History: History  Smoking Status  . Former Smoker  . Packs/day: 2.00  . Years: 40.00  . Types: Cigarettes  . Quit date: 07/20/1995  Smokeless Tobacco  . Never Used   Counseling given: Not Answered   Outpatient Encounter Prescriptions as of 07/13/2016  Medication Sig  . albuterol (PROVENTIL) (2.5 MG/3ML) 0.083% nebulizer solution Take 3 mLs (2.5 mg total) by nebulization every 6 (six) hours as needed for wheezing or shortness of breath. Patient will also  need nebulizer machine please  . ALPRAZolam (XANAX) 0.5 MG tablet Take 1 tablet (0.5 mg total) by mouth at bedtime.  Marland Kitchen amLODipine (NORVASC) 5 MG tablet TAKE ONE TABLET BY MOUTH EVERY DAY.  Marland Kitchen aspirin 81 MG EC tablet Take 81 mg by mouth daily.    . budesonide-formoterol (SYMBICORT) 160-4.5 MCG/ACT inhaler Inhale 2 puffs into the lungs 2 (two) times daily.  . clopidogrel (PLAVIX) 75 MG tablet TAKE ONE (1) TABLET BY MOUTH EVERY DAY  . latanoprost (XALATAN) 0.005 % ophthalmic solution Place 1 drop into both eyes at bedtime.   Marland Kitchen losartan (COZAAR)  100 MG tablet Take 1 tablet (100 mg total) by mouth daily.  . metoprolol succinate (TOPROL-XL) 50 MG 24 hr tablet Take 1 tablet (50 mg total) by mouth daily.  . montelukast (SINGULAIR) 10 MG tablet Take 1 tablet by mouth daily.  . Multiple Vitamins-Minerals (CENTRUM SILVER PO) Take 1 tablet by mouth daily.    . nitroGLYCERIN (NITROSTAT) 0.4 MG SL tablet Place 1 tablet (0.4 mg total) under the tongue every 5 (five) minutes as needed for chest pain.  . pantoprazole (PROTONIX) 40 MG tablet TAKE ONE (1) TABLET EACH DAY  . ranitidine (ZANTAC) 150 MG tablet Take 150 mg by mouth at bedtime.  . rosuvastatin (CRESTOR) 10 MG tablet TAKE ONE (1) TABLET BY MOUTH EVERY DAY  . sucralfate (CARAFATE) 1 G tablet Take 1 tablet (1 g total) by mouth 4 (four) times daily -  with meals and at bedtime.  . timolol (TIMOPTIC) 0.5 % ophthalmic solution Place 1 drop into both eyes daily.   Marland Kitchen tiotropium (SPIRIVA) 18 MCG inhalation capsule Place 1 capsule (18 mcg total) into inhaler and inhale daily.  . predniSONE (DELTASONE) 10 MG tablet 4 tabs for 2 days, then 3 tabs for 2 days, 2 tabs for 2 days, then 1 tab for 2 days, then stop  . [DISCONTINUED] amoxicillin-clavulanate (AUGMENTIN) 875-125 MG tablet Take 1 tablet by mouth 2 (two) times daily. (Patient not taking: Reported on 07/13/2016)   No facility-administered encounter medications on file as of 07/13/2016.      Review of Systems  Constitutional:   No  weight loss, night sweats,  Fevers, chills,  +fatigue, or  lassitude.  HEENT:   No headaches,  Difficulty swallowing,  Tooth/dental problems, or  Sore throat,                No sneezing, itching, ear ache, + nasal congestion, post nasal drip,   CV:  No chest pain,  Orthopnea, PND, swelling in lower extremities, anasarca, dizziness, palpitations, syncope.   GI  No heartburn, indigestion, abdominal pain, nausea, vomiting, diarrhea, change in bowel habits, loss of appetite, bloody stools.   Resp:  .  No chest  wall deformity  Skin: no rash or lesions.  GU: no dysuria, change in color of urine, no urgency or frequency.  No flank pain, no hematuria   MS:  No joint pain or swelling.  No decreased range of motion.  No back pain.    Physical Exam  BP 126/70 (BP Location: Left Arm, Cuff Size: Normal)   Pulse 88   Ht 5\' 8"  (1.727 m)   Wt 156 lb 12.8 oz (71.1 kg)   SpO2 95%   BMI 23.84 kg/m    GEN: A/Ox3; pleasant , NAD, elderly    HEENT:  [place]/AT,  EACs-clear, TMs-wnl, NOSE-clear, THROAT-clear, no lesions, no postnasal drip or exudate noted.   NECK:  Supple w/ fair ROM; no  JVD; normal carotid impulses w/o bruits; no thyromegaly or nodules palpated; no lymphadenopathy.    RESP  Decreased BS in bases w/o, wheezes/ rales/ or rhonchi. no accessory muscle use, no dullness to percussion  CARD:  RRR, no m/r/g, no peripheral edema, pulses intact, no cyanosis or clubbing.  GI:   Soft & nt; nml bowel sounds; no organomegaly or masses detected.   Musco: Warm bil, no deformities or joint swelling noted.   Neuro: alert, no focal deficits noted.    Skin: Warm, no lesions or rashes  Psych:  No change in mood or affect. No depression or anxiety.  No memory loss.  Lab Results:  CBC    Component Value Date/Time   WBC 14.6 (H) 06/02/2016 0600   RBC 4.37 06/02/2016 0600   HGB 13.3 06/02/2016 0600   HCT 40.1 06/02/2016 0600   PLT 531 (H) 06/02/2016 0600   MCV 91.8 06/02/2016 0600   MCH 30.4 06/02/2016 0600   MCHC 33.2 06/02/2016 0600   RDW 13.6 06/02/2016 0600   LYMPHSABS 1.2 05/25/2016 1119   MONOABS 1.4 (H) 05/25/2016 1119   EOSABS 0.0 05/25/2016 1119   BASOSABS 0.0 05/25/2016 1119    BMET    Component Value Date/Time   NA 137 06/02/2016 0600   K 4.0 06/02/2016 0600   CL 104 06/02/2016 0600   CO2 25 06/02/2016 0600   GLUCOSE 115 (H) 06/02/2016 0600   BUN 10 06/02/2016 0600   CREATININE 0.85 06/02/2016 0600   CREATININE 0.79 01/31/2013 0940   CALCIUM 8.6 (L) 06/02/2016 0600    GFRNONAA >60 06/02/2016 0600   GFRAA >60 06/02/2016 0600    BNP No results found for: BNP  ProBNP    Component Value Date/Time   PROBNP 190.6 (H) 04/07/2014 1600    Imaging: Dg Chest 2 View  Result Date: 07/13/2016 CLINICAL DATA:  Productive cough for 3 days with chest pain shortness of breath. COPD. EXAM: CHEST  2 VIEW COMPARISON:  06/07/2016 FINDINGS: Previous coronary bypass changes noted. Background COPD/ emphysema evident with hyperinflation and parenchymal scarring. Improvement in the patchy airspace disease in the medial right upper lobe and in the right lower lobe compatible with resolving pneumonia. No current acute airspace process, collapse or consolidation. Negative for edema, effusion or pneumothorax. Trachea is midline. Aorta is atherosclerotic. Minor thoracic spondylosis. IMPRESSION: Chronic COPD/ emphysema. Resolving patchy airspace process/ pneumonia throughout the right lung Previous coronary bypass Thoracic aortic atherosclerosis No current acute process. Electronically Signed   By: Jerilynn Mages.  Shick M.D.   On: 07/13/2016 15:46    CXR ,07/13/2016  Chronic COPD/ emphysema.  Resolving patchy airspace process/ pneumonia throughout the right lung  Assessment & Plan:   COPD GOLD II  Flare   CXR w/ resolving PNA   Plan  Patient Instructions  Prednisone taper over next week.  Mucinex DM Twice daily  As needed  Cough /congestion .  Continue on Symbicort and Spiriva .  Follow up with Dr. Lenna Gilford  Next month as planned  Please contact office for sooner follow up if symptoms do not improve or worsen or seek emergency care      CAP (community acquired pneumonia) CXR shows resolving PNA  Hold on additonal abx att this time  follow up next month as planned      Rexene Edison, NP 07/13/2016

## 2016-07-13 NOTE — Assessment & Plan Note (Signed)
Flare   CXR w/ resolving PNA   Plan  Patient Instructions  Prednisone taper over next week.  Mucinex DM Twice daily  As needed  Cough /congestion .  Continue on Symbicort and Spiriva .  Follow up with Dr. Lenna Gilford  Next month as planned  Please contact office for sooner follow up if symptoms do not improve or worsen or seek emergency care

## 2016-07-13 NOTE — Patient Instructions (Addendum)
Prednisone taper over next week.  Mucinex DM Twice daily  As needed  Cough /congestion .  Continue on Symbicort and Spiriva .  Follow up with Dr. Lenna Gilford  Next month as planned  Please contact office for sooner follow up if symptoms do not improve or worsen or seek emergency care

## 2016-07-27 ENCOUNTER — Encounter: Payer: Self-pay | Admitting: Pulmonary Disease

## 2016-07-27 ENCOUNTER — Ambulatory Visit (INDEPENDENT_AMBULATORY_CARE_PROVIDER_SITE_OTHER): Payer: Medicare Other | Admitting: Pulmonary Disease

## 2016-07-27 VITALS — BP 120/62 | HR 72 | Temp 97.0°F | Ht 68.0 in | Wt 154.5 lb

## 2016-07-27 DIAGNOSIS — J189 Pneumonia, unspecified organism: Secondary | ICD-10-CM | POA: Diagnosis not present

## 2016-07-27 DIAGNOSIS — I2510 Atherosclerotic heart disease of native coronary artery without angina pectoris: Secondary | ICD-10-CM | POA: Diagnosis not present

## 2016-07-27 DIAGNOSIS — K219 Gastro-esophageal reflux disease without esophagitis: Secondary | ICD-10-CM

## 2016-07-27 DIAGNOSIS — J439 Emphysema, unspecified: Secondary | ICD-10-CM

## 2016-07-27 DIAGNOSIS — I739 Peripheral vascular disease, unspecified: Secondary | ICD-10-CM | POA: Diagnosis not present

## 2016-07-27 DIAGNOSIS — C159 Malignant neoplasm of esophagus, unspecified: Secondary | ICD-10-CM

## 2016-07-27 DIAGNOSIS — R911 Solitary pulmonary nodule: Secondary | ICD-10-CM

## 2016-07-27 DIAGNOSIS — I10 Essential (primary) hypertension: Secondary | ICD-10-CM

## 2016-07-27 DIAGNOSIS — Z951 Presence of aortocoronary bypass graft: Secondary | ICD-10-CM | POA: Diagnosis not present

## 2016-07-27 DIAGNOSIS — I679 Cerebrovascular disease, unspecified: Secondary | ICD-10-CM

## 2016-07-27 MED ORDER — BUDESONIDE-FORMOTEROL FUMARATE 160-4.5 MCG/ACT IN AERO: 2.0000 | INHALATION_SPRAY | Freq: Two times a day (BID) | RESPIRATORY_TRACT | 0 refills | Status: DC

## 2016-07-27 NOTE — Patient Instructions (Signed)
Today we updated your med list in our EPIC system...    Continue your current medications the same...  We discussed increasing the [place] to 600mg  4 times daily w/ extra water by mouth...    And using the NEBULIZER more regularly... Say 2-3-4 times daily as we discussed...  Continue the [place], [place], & the SINGULAIR...  Keep up the great work w/ your exercise program...  Call for any questions...  Let's plan a follow up visit in 48mo, sooner if needed for breathing problems.Marland KitchenMarland Kitchen

## 2016-07-27 NOTE — Progress Notes (Signed)
Subjective:     Patient ID: Gregory Eaton, male   DOB: 1946-05-12, 71 y.o.   MRN: [id number]  HPI ~  January 30, 2015:  Initial pulmonary consult w/ SN>   59 y/o WM, referred by DrGolding in [place] for a pulmonary evaluation due to COPD/ emphysema/ & 7-1m RLL nodule on CT Chest>      CCejayrelates a history of sinus infection/ URI in April2016 & went to his PCP DrGolding, treated w/ antibiotic & Pred=> improved but didn't get over it so he was given a second antibiotic + Spiriva, Proair, Singulair => finally improved so he tells me he stopped the Spiriva "I'm better"...  He is an ex-smoker, starting in his teens, smoked for 40 yrs up to 2ppd at max for a 55-60 pack-year smoking history... 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     He has a hx HBP, HL, CAD, s/p CABG in 1998, known carotid art dis & ASPVD w/ prev Fem-Pop bypass> followed by DrManuela Neptunel & his 10/07/14 Epic note is reviewed; meds adjusted, see below...    [facility]... EXAM revealed Afeb, VSS, O2sat=95% on RA;  HEENT- neg, mallampati2;  Chest- decr BS bilat w/o w/r/r;  Heart- RR gr1/6 SEM w/o r/g;  Abd- soft, neg;  Ext- w/o c/c/e...  CT Angio Chest 04/07/14 showed neg for PE, s/p CABG, severe atherosclerosis in Ao, severe bilat emphysema, 23m17module RLL, inflamm changes in lingula, post surg changes related to esophagectomy & gastric pull thru, no signif LNs...  EKG 09/2014 showed NSR, rate64, RBBB, NAD...  [facility], unchanged 7mm24mL nodule, no new lesions, post-op changes after esophagectomy & gastric pull thru...  CXR 12/11/14 showed norm heart size, s/p CABG, COPD/ emphysema, NAD...   Spirometry 01/30/15 showed FVC=3.45 (82%),  FEV1=1.76 (54%), %1sec=51, mid-flows reduced at 23% predicted;  Mod severe airflow obstruction w/ GOLD Stage 2-3 COPD....   Ambulatory oxygen saturation test 01/30/15> O2sat=96% on RA at rest w/ pulse=67/min;  He ambulated 3 laps in the office w/ nadir O2sat=93% w/ pulse=85/min...  LABS 7/16:  Alpha-1-Antitrypsin level ==> 133 (83-199 mg/dL) and phenotype= M1M2  IMP/PLAN>>  Gregory Eaton mod severe COPD/ emphysema based on his PFTs and heavy smoking hx; he would benefit from ICS/LABA and LAMA Rx=> rec to use SYMBICORT160-2spBid & SPIRIVA daily; he will continue PROAIR-HFA for acute symptoms prn, SINGULAIR10, and MUCINEX 600mg423mfor thick phlegm/ mucus; he needs to maintain a vigorous antireflux regimen w/ his esoph dis & surg; as much as poss he will avoid infections and needs to start a vigorous exercise program (eg- silver sneakers or similar)... Prognosis is guarded w/ his signif co-morbidities (ASHD, ASPVD, Hx esoph ca w/ surg 2010, etc);  He will ret in 732mo w2732mollPFTs to check DLCO...  ~  April 03, 2015:  732mo RO37mo SN>         Gregory Eaton for f/u on his Advair115-2spBid, Spiriva daily, Singulair10, and ProairHFA prn;  He has had some trouble w/ cost of the meds and asked to get a copy of his insurance company prescription drug formulary for us to rKoreaiew;  He notes that his breathing is stable  overall- still mows yard & does ok, not much cough/ sput/ no hemoptysis/ no CP/ no edema;  He has some reflux issues on Protonix40 & reminded of the vigorous antireflux regimen he needs to follow including NPO after dinner, elev HOB 6", take Pepcid ~46mn before bedtime, etc... We reviewed the following medical problems during today's office visit >>     COPD/ emphysema ==> GOLD Stage 2-3 disease>  Continue Advair115-2spBid, Spiriva daily, Singulair10, ProairHFA prn; get uKoreaa copy of his prescription drug formulary...    7-88mRLL pulmonary nodule on CT Chest>  No change noted from 2015=>2016 ([facility]- they  felt it was benign) & we discussed repeat scan in spring of 2017...    HBP>  [place] Toprol[place], Amlod5, Cozaar50; BP=130/74 & he denies CP, palpit, SOB, edema...    CAD- s/p 5 vessel CABG, RBBB>  On ASA/ Plavix; [facility]...    Carotid stenosis>  Mild bilat carotid stenoses on CDopplers    ASPVD>  s/p right Fem-Pop    Hx esophageal cancer- s/[facility]>  His GI is DrRourk, [facility] and [facility] but no CareEverywhere records are avail...    Other medical issues as noted>  See below... EXAM revealed Afeb, VSS, O2sat=97% on RA;  HEENT- neg, mallampati2;  Chest- decr BS bilat w/o w/r/r;  Heart- RR gr1/6 SEM w/o r/g;  Abd- soft, neg;  Ext- w/o c/c/e...  FullPFT 04/03/15 showed FVC=4.22 (100%), FEV1=2.16 (70%), %1sec=51, mid-flows reduced at 36% predicted; post-bronchodil there was a 3% incr in FEV1; TLC=6.80 (100%), RV=1.79 (75%), RV/TLC=26; DLCO=55% predicted... These PFTs indicate moderately severe airflow obstruction, GOLD Stage2-3 COPD, decr diffusion c/w emphysema...  IMP/PLAN>>  As prev noted- ChEthels rec to stay on the Advair,Spiriva, Proair; avoid infections, get all approp vaccinations from his PCP; follow the vigorous antireflux regimen as outlined, and plan f/u w/ usKorean about 96m58moooner if needed...   ~  September 18, 2015:  2mo90mo w/ SN>  Gregory Eaton a COPD exac 06/2015 & saw DrWert- he had stopped his Symbicort & Spiriva, presented w/ some congestion & wheezing, & given ZPak, Pred, Symbicort, Mucinex, Zantac;  He did well after this therapy until recent similar symptoms- cough, chest congestion, yellow sput, but he denies f/c/s, incr SOB, chest tightness/ wheezing/ etc;  He is again NOT using his Symbicort regularly as it costs him $150/mo; he has been active- using his exerc bike, caring for wife s/p TKR, yard work & push mows...     COPD/ emphysema ==> GOLD Stage 2-3  disease>  He again stopped his Symbicort & he is off the Spiriva; on Singulair10, ProairHFA prn; needs to get us aKoreaopy of his prescription drug formulary (reminded again)...    7-8mm 27m pulmonary nodule on CT Chest>  No change noted from 2015=>2016 ([facility]- they felt it was benign) & we discussed repeat CT Chest but he wants to hold-off...    He had Cards appt 05/2015 w/ DrKelly> HBP, CAD, s/pCABG 1998, ASPVD- s/p right Fem-Pop/ occluded left vertebral/ mild carotid dis, HL, [facility], no changes made. EXAM revealed Afeb, VSS, O2sat=98% on RA;  HEENT- neg, mallampati2;  Chest- decr BS bilat w/o w/r/r;  Heart- RR gr1/6 SEM w/o r/g;  Abd- soft, neg;  Ext- w/o c/c/e;  Neuro- intact... IMP/PLAN>>  I  again explained to Gregory Eaton the need for regular ICS/LABA medication- he needs to contact his insurance regarding a their prescription drug formulary, in the meanwhile we will refill the Symbicort160- 2spBid;  Rx written for ZPak w/ refills per his request...  ~  March 23, 2016:  96moROV w/ SN>  CDeavenreports that he is doing well- no new complaints or concerns;  He has min cough Qam, sm amt clear sput (no color or blood), "it may be reflux" he says; denies SOB/ CP/ palpit/ etc; he does have chr stable DOE (no change- eg. climbing hills);  On SYMBICORT160-2spBid (taking it "most of the time", notes $45 copay now), SINGULAIR10, Mucinex- just taking this prn...  NOTE: his FEV1 was 2.16L Sep2016 & has dropped to 1.76L today => he is asked to add the [facility] regimen (alternatives- Incruse, TCaprice Renshaw he will check formulary),,,     See prob list above-- HBP, CAD- s/p 5vessel CABG, RBBB, ASPVD- s/p R fem-pop, bilat carotid stenoses> on ASA/PLAVIX, MetopXL50, Amlod5, Losar75;  BP stable, denies CP, palpit, ch in DOE, edema, etc...     He has hx esoph Ca, s/p esophagectomy & felt to be cured; [facility] ([facility]) seen  03/03/16- plans f/u EGD/ Colon soon...  EXAM revealed Afeb, VSS, O2sat=98% on RA;  HEENT- neg, mallampati2;  Chest- decr BS bilat w/o w/r/r;  Heart- RR gr1/6 SEM w/o r/g;  Abd- soft, neg;  Ext- w/o c/c/e;  Neuro- intact...  CXR 12/02/15 by DrGolding>  Norm heart size s/p CABG, mild hyperinflation, coarse interstitial markings, small HH w/ A/F level...   [facility]  FLP- all parameters at goals;  Chems- ok w/ Cr=0.74, BS=88;  CBC- wnl...   Spirometry 03/23/16>  FVC=4.10 (101%), FEV1=1.76 (59%), %1sec=43, mid-flows reduced at 20% predicted... This is c/w a severe obstructive ventilatory defect & GOLD Stage 2 CPD... IMP/PLAN>>  His PCP is DrGolding (seen 2-3x/yr, Cards-DrKelly, GI-DrRourk;  We reviewed his deterioration in FEV1 over the last yr & rec to take the Symbicort- 2sp Bid regularly & add-in SPIRIVA once daily every day... we plan routine ROV in 670mo.  ~  May 12, 2016:  7wk ROV & add[place]>  ChDama[place]ieports that [place] when he became ill w/ chest congestion, SOB, fever to 101, chills, & weak w/ decr in BP; [facility] [place];  He had left lung pneumonia on CXR, WBC=14.8, Sput grew NTF only, BNP=119;  He was treated w/ Levaquin and slowly improved; he [place] to [place] for 3 w[place] to recoup & [place]...     COPD/ emphysema ==> GOLD Stage 2-3 disease>  He again stopped his Symbicort & he is off the Spiriva; on Singulair10, ProairHFA prn; needs to get usKorea copy of his prescription drug formulary (reminded again)...    7-54m66mLL pulmonary nodule on CT Chest>  No change noted from 2015=>2016 ([facility]- they felt it was benign) & we discussed repeat CT Chest but he wants to hold-off...    [facility]>  No organism isolated, treated w/ Levaquin and slowly resolved back to  baseline...    CARDIAC issues>  HBP, CAD- s/p 5 vessel bypass 1998, RBBB, ASPVD- s/p right Fem-Pop/ occluded  left vertebral/ mild carotid dis>  On ASA/ Plavix, + ToprolXL50, Amlod5, Cozaar50 & [facility] for [facility]...     Hx esophageal cancer- s/p esophagectomy w/ gastric pull through at [facility] in 2010>  His GI is DrRourk, [facility] and his surgery was done at [facility] in [facility] but no CareEverywhere records are avail...    Other medical issues as noted>  HBP, HL, IFG, GERD, Barrett's, Esoph Ca, colon polyps, divertics, anxiety on Xanax...  EXAM revealed Afeb, VSS, O2sat=95% on RA;  HEENT- neg, mallampati2;  Chest- decr BS bilat w/o w/r/r;  Heart- RR gr1/6 SEM w/o r/g;  Abd- soft, neg;  Ext- w/o c/c/e;  Neuro- intact...  CXR 05/12/16>  Norm heart size, s/p CABG, hyperinflation w/ COPD, linear scarring in LUL & lingula, NAD...   IMP/PLAN>>  Gregory Eaton has made a nice recovery from his recent CAP 9/17 in [place]; he is off antibiotics and rec to continue his Symbicort160-2spBid, Spiriva daily, Singulair10, Mucinex '1200mg'$  Bid fluids, etc...  ~  July 27, 2016:  2-67moROV & Gregory Eaton re-hospitalized 11/7 - 06/03/16 by [facility] w/ aspiration pneumonia & severe on-going reflux s/p [facility];  He was treated w/ IV Zosyn=> Augmentin, eval by speech Path, sent home w/ new home O2;  Since disch he has been seen by CARDS-DrKelly> HBP, CAD, s/pCABG1998, HL, [facility] known ASPVD w/ occluded left vertebral art, mild carotid dis, s/p fem-pop bypass & occlusive right SFA dis;  Stable on ASA/Plavix, ToprolXL, Amlod, Losar, Crestor;  He was also eval post hosp by TP> improved but seen again 07/13/16 w/ incr cough, thick mucus, & CXR showed improved aeration/ resolving pneumonia (rec same inhalers + incr ?Mucinex, fluids, & Pred taper)...     SEE PROB LIST ABOVE => reviewed in detail w/ pt...    CChampis back to baseline- sl cough,  small amt clear sput, no hemoptysis, SOB/DOE is stable (eg-he bikes 39m/d);  Problems as listed above- on Symbicort 160-2spBid, Spiriva daily, Singulair10, and ALBUT NEBS prn EXAM revealed Afeb, VSS, O2sat=98% on RA;  HEENT- neg, mallampati2;  Chest- decr BS bilat w/o w/r/r;  Heart- RR gr1/6 SEM w/o r/g;  Abd- soft, neg;  Ext- w/o c/c/e;  Neuro- intact...  CXR 07/13/16>  S/p CABG & atherosclerotic changes in Ao, background COPD/E w/ hyperinflation & scarring, marked improvement in RUL/RLL areas...  LABS 05/2016>  Chems- ok w/ BS~110-160;  CBC- ok x WBC~10-15K;   IMP/PLAN>>  He is reminded to use his inhalers regularly & encouraged to use the NEBULIZER as needed;  Needs to incr the Mucinex'600mg'$  to 2Bidf w/ fluids;  O2sats are all in the 90s on RA- he [facility];  Rec to continue his exercise program 7 call for any set backs in his status... we plan ROV in 65m78mo  Past Medical History  Diagnosis Date  . Esophageal carcinoma 03/2009    T1N1M0  . Hypertension >> on Metoprolol, Amlodipine, Losartan   . Barrett's esophagus   . GERD (gastroesophageal reflux disease) >> on Protonix40 & Carafate 1gmQid   . CAD (coronary artery disease)   . PVD (peripheral vascular disease)   . Hyperlipidemia >> on Crestor10, Fish Oil + diet   . Adenomatous colon polyp 03/2009    Last colonoscopy by Dr. RouGala Romney. Diverticulosis   . Tobacco abuse   . [place]  Status post ablation at [facility]  . COPD (chronic obstructive pulmonary disease)     Severe emphysema per CT  . History of echocardiogram 08/27/2009    EF >55%  . History of nuclear stress test 11/24/2011    lexiscan; normal perfusion; low risk scan; non-diagnostic for ischemia  . History of Doppler ultrasound 11/09/2011    03/2014- 50-69% L ICA stenosis;carotid doppler; L bulb/prox ICA 0-49% diameter reduction; L vertebral artery - occlusive ds; L ECA  demonstrates severe amount of fibrous plaque  . History of Doppler  ultrasound 11/09/11    LEAs; R ABI - mod art. insuff.; L ABI normal at rest; R SFA - occlusive ds; L SFA - occlusive ds; patent fem-pop graft  . Adenomatous polyp of colon 11/03/2010  . Left carotid artery stenosis 04/08/2014  . Pulmonary nodule, right 04/08/2014    8 mm-incidental finding on CT  . DM type 2 (diabetes mellitus, type 2) >> on diet alone 04/09/2014    A1c 6.5 (04/08/14)     Past Surgical History:  Procedure Laterality Date  . COLONOSCOPY  03/17/2009   Dr.Rourk- normal rectum, sigmoid diverticula, some pale sigmoid mucosa with diffuse petechiae. pedunculated polyp at the splenic flexure, remainder of colonic mucosa appeared normal. bx= adenomatous polyp  . COLONOSCOPY  04/19/2003   Dr.Rehman- few diverticu;a at the sigmoid colon, 3 small polyps, one at the transverse colon and 2 at the sigmoid, small external hemorrhoids. bx report not available.   . CORONARY ARTERY BYPASS GRAFT  1998   [name]  . ESOPHAGECTOMY  [facility]   [facility] Dr. [name]  . ESOPHAGOGASTRODUODENOSCOPY  11/25/2010   Dr.Rourk- s/p esophagectomy with gastric pull-up, esophageal erosions straddling the surgical anastomosis, salmon colored epithelium coming up a good centimeter to a centimeter and a half above the suture line, islands of salmon colored epithelium in the most poximal residual esophagus, remainder of gastric mucosa appeared normal. bx= swamous &gastric glandular mucosa w/chronic active inflammation  . ESOPHAGOGASTRODUODENOSCOPY  03/17/2009   Dr.Rourk- 4cm segment of salmon-colored epithlium distal esophagus suspicious for barretts esophagus. area of suspicious nodularity w/in this segment bx seperately. small to moderate size hiatal hernia, o/w normal stomach D1 and D2 bx=adenocarcinoma  . FEMORAL-POPLITEAL BYPASS GRAFT  1993   occlusive ds in R SFA  . GASTRIC PULL THROUGH  2010   With esophagectomy  . LIPOMA EXCISION  2010    Outpatient Encounter Prescriptions as of 07/27/2016  Medication Sig  .  albuterol (PROVENTIL) (2.5 MG/3ML) 0.083% nebulizer solution Take 3 mLs (2.5 mg total) by nebulization every 6 (six) hours as needed for wheezing or shortness of breath. Patient will also need nebulizer machine please  . ALPRAZolam (XANAX) 0.5 MG tablet Take 1 tablet (0.5 mg total) by mouth at bedtime.  Marland Kitchen amLODipine (NORVASC) 5 MG tablet TAKE ONE TABLET BY MOUTH EVERY DAY.  Marland Kitchen aspirin 81 MG EC tablet Take 81 mg by mouth daily.    . budesonide-formoterol (SYMBICORT) 160-4.5 MCG/ACT inhaler Inhale 2 puffs into the lungs 2 (two) times daily.  . clopidogrel (PLAVIX) 75 MG tablet TAKE ONE (1) TABLET BY MOUTH EVERY DAY  . latanoprost (XALATAN) 0.005 % ophthalmic solution Place 1 drop into both eyes at bedtime.   Marland Kitchen losartan (COZAAR) 100 MG tablet Take 1 tablet (100 mg total) by mouth daily.  . metoprolol succinate (TOPROL-XL) 50 MG 24 hr tablet Take 1 tablet (50 mg total) by mouth daily.  . montelukast (SINGULAIR) 10 MG tablet Take 1 tablet by mouth  daily.  . Multiple Vitamins-Minerals (CENTRUM SILVER PO) Take 1 tablet by mouth daily.    . nitroGLYCERIN (NITROSTAT) 0.4 MG SL tablet Place 1 tablet (0.4 mg total) under the tongue every 5 (five) minutes as needed for chest pain.  . pantoprazole (PROTONIX) 40 MG tablet TAKE ONE (1) TABLET EACH DAY  . ranitidine (ZANTAC) 150 MG tablet Take 150 mg by mouth at bedtime.  . rosuvastatin (CRESTOR) 10 MG tablet TAKE ONE (1) TABLET BY MOUTH EVERY DAY  . sucralfate (CARAFATE) 1 G tablet Take 1 tablet (1 g total) by mouth 4 (four) times daily -  with meals and at bedtime.  . timolol (TIMOPTIC) 0.5 % ophthalmic solution Place 1 drop into both eyes daily.   Marland Kitchen tiotropium (SPIRIVA) 18 MCG inhalation capsule Place 1 capsule (18 mcg total) into inhaler and inhale daily.  . budesonide-formoterol (SYMBICORT) 160-4.5 MCG/ACT inhaler Inhale 2 puffs into the lungs 2 (two) times daily.  . [DISCONTINUED] predniSONE (DELTASONE) 10 MG tablet 4 tabs for 2 days, then 3 tabs for 2 days,  2 tabs for 2 days, then 1 tab for 2 days, then stop (Patient not taking: Reported on 07/27/2016)   No facility-administered encounter medications on file as of 07/27/2016.     Allergies  Allergen Reactions  . Altace [Ramipril] Cough  . Prednisone Other (See Comments)    "throat broke out"     Current Medications, Allergies, Past Medical History, Past Surgical History, Family History, and Social History were reviewed in [employer] record.   Review of Systems            All symptoms NEG except where BOLDED >>  Constitutional:  F/C/S, fatigue, anorexia, unexpected weight change. HEENT:  HA, visual changes, hearing loss, earache, nasal symptoms, sore throat, mouth sores, hoarseness. Resp:  cough, sputum, hemoptysis; SOB, tightness, wheezing. Cardio:  CP, palpit, DOE, orthopnea, edema. GI:  N/V/D/C, blood in stool; reflux, abd pain, distention, gas. GU:  dysuria, freq, urgency, hematuria, flank pain, voiding difficulty. MS:  joint pain, swelling, tenderness, decr ROM; neck pain, back pain, etc. Neuro:  HA, tremors, seizures, dizziness, syncope, weakness, numbness, gait abn. Skin:  suspicious lesions or skin rash. Heme:  adenopathy, bruising, bleeding. Psyche:  confusion, agitation, sleep disturbance, hallucinations, anxiety, depression suicidal.   Objective:   Physical Exam      Vital Signs:  Reviewed...   General:  WD, WN, 71 y/o WM in NAD; alert & oriented; pleasant & cooperative... HEENT:  [place]/AT; Conjunctiva- pink, Sclera- nonicteric, EOM-wnl, PERRLA, EACs-clear, TMs-wnl; NOSE-clear; THROAT-clear & wnl.  Neck:  Supple w/ fair ROM; no JVD; normal carotid impulses w/o bruits; no thyromegaly or nodules palpated; no lymphadenopathy.  Chest:  decr BS bilat, without wheezes, rales, or rhonchi heard. Heart:  Regular Rhythm; gr1/6 SEM w/o rubs or gallops detected. Abdomen:  Soft & nontender- no guarding or rebound; normal bowel sounds; no organomegaly or masses  palpated. Ext:  decrROM; without deformities +arthritic changes; no varicose veins, +venous insuffic, or edema;  Pulses intact w/o bruits. Neuro:  No focal neuro deficits; sensory testing normal; gait normal & balance OK. Derm:  No lesions noted; no rash etc. Lymph:  No cervical, supraclavicular, axillary, or inguinal adenopathy palpated.   Assessment:      IMP >>     COPD/ emphysema ==> GOLD Stage 2-3 disease    7-20m RLL pulmonary nodule on CT Chest    [place]  [place]    CAD- s/p 5 vessel CABG    Carotid stenosis    ASPVD- s/p Fem-Pop    Hx esophageal cancer- s/p esophagectomy w/ gastric pull through at [facility] in 2010    Other medical issues as noted...   PLAN >>  01/30/15>  Gregory Eaton has mod severe COPD/ emphysema based on his PFTs and heaqvy smoking hx; he would benefit from ICS/LABA and LAMA Rx=> rec to use SYMBICORT160-2spBid & SPIRIVA daily; he will continue PROAIR-HFA for acute symptoms prn, SINGULAIR10, and MUCINEX '600mg'$ Qid for thick phlegm/ mucus;; he needs to maintain a vigorous antireflux regimen w/ his esoph dis & surg; as much as poss he will avoid infections and needs to start a vigorous exercise program (eg- silver sneakers or similar)... Prognosis is guarded w/ his signif co-morbidities (ASHD, ASPVD, Hx esoph ca w/ surg 2010, etc);  He will ret in 52mow/ FullPFTs to check DLCO. 04/03/15>  As prev noted- CMiladis rec to stay on the Advair,Spiriva, Proair; avoid infections, get all approp vaccinations from his PCP; follow the vigorous antireflux regimen as outlined, and plan f/u w/ u[place]ore[place] about 484mosooner if needed. 09/18/15>   I again explained to Gregory Eaton need for regular ICS/LABA medication- he needs to contact his insurance regarding a their prescription drug formulary, in the meanwhile we will refill the Symbicort160- 2spBid;  Rx written for ZPak w/ refills per his request. 05/12/16>   Gregory Eaton [place] a [place] [facility] [place] [facility], treated w/ Levaquin & made a slow but steady recovery; again requested to continue regular Rx w/ his Symbicort160-2spBid, Spiriva daily, Singulair10, Mucinex '1200mg'$  Bid fluids, etc 11/7 - 06/03/16>  Hosp by [facility] w/ asp pneumonia & severe on-going reflux s/p [facility]; he responded to antibiotics w/ Zosyn=> Augmentin, and CXR resolved over the next month... 07/27/16>  He is back to baseline, & reminded to use his inhalers regularly & encouraged to use the NEBULIZER as needed;  Needs to incr the Mucinex'600mg'$  to 2Bidf w/ fluids;  O2sats are all in the 90s on RA- he [facility];  Rec to continue his exercise program & call for any set backs in his status... we plan ROV in 63m2mo   Plan:       Patient's Medications  New Prescriptions   BUDESONIDE-FORMOTEROL (SYMBICORT) 160-4.5 MCG/ACT INHALER    Inhale 2 puffs into the lungs 2 (two) times daily.  Previous Medications   ALBUTEROL (PROVENTIL) (2.5 MG/3ML) 0.083% NEBULIZER SOLUTION    Take 3 mLs (2.5 mg total) by nebulization every 6 (six) hours as needed for wheezing or shortness of breath. Patient will also need nebulizer machine please   ALPRAZOLAM (XANAX) 0.5 MG TABLET    Take 1 tablet (0.5 mg total) by mouth at bedtime.   AMLODIPINE (NORVASC) 5 MG TABLET    TAKE ONE TABLET BY MOUTH EVERY DAY.   ASPIRIN 81 MG EC TABLET    Take 81 mg by mouth daily.     BUDESONIDE-FORMOTEROL (SYMBICORT) 160-4.5 MCG/ACT INHALER    Inhale 2 puffs into the lungs 2 (two) times daily.   CLOPIDOGREL (PLAVIX) 75 MG TABLET    TAKE ONE (1) TABLET BY MOUTH EVERY DAY   LATANOPROST (XALATAN) 0.005 % OPHTHALMIC SOLUTION    Place 1 drop into both eyes at bedtime.    LOSARTAN (COZAAR) 100  MG TABLET    Take 1 tablet (100 mg total) by mouth daily.   METOPROLOL SUCCINATE (TOPROL-XL) 50 MG 24 HR TABLET    Take 1 tablet (50 mg total) by mouth daily.    MONTELUKAST (SINGULAIR) 10 MG TABLET    Take 1 tablet by mouth daily.   MULTIPLE VITAMINS-MINERALS (CENTRUM SILVER PO)    Take 1 tablet by mouth daily.     NITROGLYCERIN (NITROSTAT) 0.4 MG SL TABLET    Place 1 tablet (0.4 mg total) under the tongue every 5 (five) minutes as needed for chest pain.   PANTOPRAZOLE (PROTONIX) 40 MG TABLET    TAKE ONE (1) TABLET EACH DAY   RANITIDINE (ZANTAC) 150 MG TABLET    Take 150 mg by mouth at bedtime.   ROSUVASTATIN (CRESTOR) 10 MG TABLET    TAKE ONE (1) TABLET BY MOUTH EVERY DAY   SUCRALFATE (CARAFATE) 1 G TABLET    Take 1 tablet (1 g total) by mouth 4 (four) times daily -  with meals and at bedtime.   TIMOLOL (TIMOPTIC) 0.5 % OPHTHALMIC SOLUTION    Place 1 drop into both eyes daily.    TIOTROPIUM (SPIRIVA) 18 MCG INHALATION CAPSULE    Place 1 capsule (18 mcg total) into inhaler and inhale daily.  Modified Medications   No medications on file  Discontinued Medications   PREDNISONE (DELTASONE) 10 MG TABLET    4 tabs for 2 days, then 3 tabs for 2 days, 2 tabs for 2 days, then 1 tab for 2 days, then stop

## 2016-07-30 DIAGNOSIS — Z6824 Body mass index (BMI) 24.0-24.9, adult: Secondary | ICD-10-CM | POA: Diagnosis not present

## 2016-07-30 DIAGNOSIS — H9209 Otalgia, unspecified ear: Secondary | ICD-10-CM | POA: Diagnosis not present

## 2016-08-19 ENCOUNTER — Other Ambulatory Visit: Payer: Self-pay

## 2016-08-19 MED ORDER — METOPROLOL SUCCINATE ER 50 MG PO TB24: 50.0000 mg | ORAL_TABLET | Freq: Every day | ORAL | 0 refills | Status: DC

## 2016-08-19 MED ORDER — CLOPIDOGREL BISULFATE 75 MG PO TABS: ORAL_TABLET | ORAL | 0 refills | Status: DC

## 2016-08-19 MED ORDER — ROSUVASTATIN CALCIUM 10 MG PO TABS: ORAL_TABLET | ORAL | 0 refills | Status: DC

## 2016-09-13 ENCOUNTER — Other Ambulatory Visit: Payer: Self-pay | Admitting: Cardiovascular Disease

## 2016-09-16 ENCOUNTER — Ambulatory Visit (INDEPENDENT_AMBULATORY_CARE_PROVIDER_SITE_OTHER): Payer: Medicare Other | Admitting: Otolaryngology

## 2016-09-16 DIAGNOSIS — H6983 Other specified disorders of Eustachian tube, bilateral: Secondary | ICD-10-CM

## 2016-09-16 DIAGNOSIS — H9201 Otalgia, right ear: Secondary | ICD-10-CM | POA: Diagnosis not present

## 2016-09-16 DIAGNOSIS — H903 Sensorineural hearing loss, bilateral: Secondary | ICD-10-CM

## 2016-09-20 ENCOUNTER — Ambulatory Visit: Payer: Medicare Other | Admitting: Pulmonary Disease

## 2016-09-20 DIAGNOSIS — H401110 Primary open-angle glaucoma, right eye, stage unspecified: Secondary | ICD-10-CM | POA: Diagnosis not present

## 2016-09-22 DIAGNOSIS — S338XXA Sprain of other parts of lumbar spine and pelvis, initial encounter: Secondary | ICD-10-CM | POA: Diagnosis not present

## 2016-09-22 DIAGNOSIS — M9903 Segmental and somatic dysfunction of lumbar region: Secondary | ICD-10-CM | POA: Diagnosis not present

## 2016-09-22 DIAGNOSIS — M47816 Spondylosis without myelopathy or radiculopathy, lumbar region: Secondary | ICD-10-CM | POA: Diagnosis not present

## 2016-09-23 DIAGNOSIS — M9903 Segmental and somatic dysfunction of lumbar region: Secondary | ICD-10-CM | POA: Diagnosis not present

## 2016-09-23 DIAGNOSIS — M47816 Spondylosis without myelopathy or radiculopathy, lumbar region: Secondary | ICD-10-CM | POA: Diagnosis not present

## 2016-09-23 DIAGNOSIS — S338XXA Sprain of other parts of lumbar spine and pelvis, initial encounter: Secondary | ICD-10-CM | POA: Diagnosis not present

## 2016-09-28 DIAGNOSIS — M47816 Spondylosis without myelopathy or radiculopathy, lumbar region: Secondary | ICD-10-CM | POA: Diagnosis not present

## 2016-09-28 DIAGNOSIS — S338XXA Sprain of other parts of lumbar spine and pelvis, initial encounter: Secondary | ICD-10-CM | POA: Diagnosis not present

## 2016-09-28 DIAGNOSIS — M9903 Segmental and somatic dysfunction of lumbar region: Secondary | ICD-10-CM | POA: Diagnosis not present

## 2016-10-04 DIAGNOSIS — M47816 Spondylosis without myelopathy or radiculopathy, lumbar region: Secondary | ICD-10-CM | POA: Diagnosis not present

## 2016-10-04 DIAGNOSIS — S338XXA Sprain of other parts of lumbar spine and pelvis, initial encounter: Secondary | ICD-10-CM | POA: Diagnosis not present

## 2016-10-04 DIAGNOSIS — M9903 Segmental and somatic dysfunction of lumbar region: Secondary | ICD-10-CM | POA: Diagnosis not present

## 2016-10-07 DIAGNOSIS — M47816 Spondylosis without myelopathy or radiculopathy, lumbar region: Secondary | ICD-10-CM | POA: Diagnosis not present

## 2016-10-07 DIAGNOSIS — M9903 Segmental and somatic dysfunction of lumbar region: Secondary | ICD-10-CM | POA: Diagnosis not present

## 2016-10-07 DIAGNOSIS — S338XXA Sprain of other parts of lumbar spine and pelvis, initial encounter: Secondary | ICD-10-CM | POA: Diagnosis not present

## 2016-10-25 ENCOUNTER — Other Ambulatory Visit: Payer: Self-pay | Admitting: Cardiovascular Disease

## 2016-10-25 ENCOUNTER — Ambulatory Visit: Payer: Medicare Other | Admitting: Pulmonary Disease

## 2016-10-25 NOTE — Telephone Encounter (Signed)
Rx(s) sent to pharmacy electronically.  

## 2016-10-27 ENCOUNTER — Ambulatory Visit (INDEPENDENT_AMBULATORY_CARE_PROVIDER_SITE_OTHER): Payer: Medicare Other | Admitting: Pulmonary Disease

## 2016-10-27 ENCOUNTER — Encounter: Payer: Self-pay | Admitting: Pulmonary Disease

## 2016-10-27 VITALS — BP 140/72 | HR 62 | Temp 97.9°F | Ht 68.0 in | Wt 161.0 lb

## 2016-10-27 DIAGNOSIS — J439 Emphysema, unspecified: Secondary | ICD-10-CM

## 2016-10-27 DIAGNOSIS — I2510 Atherosclerotic heart disease of native coronary artery without angina pectoris: Secondary | ICD-10-CM

## 2016-10-27 DIAGNOSIS — K219 Gastro-esophageal reflux disease without esophagitis: Secondary | ICD-10-CM | POA: Diagnosis not present

## 2016-10-27 DIAGNOSIS — I739 Peripheral vascular disease, unspecified: Secondary | ICD-10-CM | POA: Diagnosis not present

## 2016-10-27 DIAGNOSIS — I10 Essential (primary) hypertension: Secondary | ICD-10-CM

## 2016-10-27 DIAGNOSIS — R911 Solitary pulmonary nodule: Secondary | ICD-10-CM

## 2016-10-27 DIAGNOSIS — Z951 Presence of aortocoronary bypass graft: Secondary | ICD-10-CM | POA: Diagnosis not present

## 2016-10-27 DIAGNOSIS — I679 Cerebrovascular disease, unspecified: Secondary | ICD-10-CM | POA: Diagnosis not present

## 2016-10-27 DIAGNOSIS — C159 Malignant neoplasm of esophagus, unspecified: Secondary | ICD-10-CM

## 2016-10-27 MED ORDER — TIOTROPIUM BROMIDE MONOHYDRATE 1.25 MCG/ACT IN AERS: 2.0000 | INHALATION_SPRAY | Freq: Every day | RESPIRATORY_TRACT | 0 refills | Status: DC

## 2016-10-27 MED ORDER — BUDESONIDE-FORMOTEROL FUMARATE 160-4.5 MCG/ACT IN AERO: 2.0000 | INHALATION_SPRAY | Freq: Two times a day (BID) | RESPIRATORY_TRACT | 0 refills | Status: DC

## 2016-10-27 NOTE — Patient Instructions (Signed)
Today we updated your med list in our EPIC system...    Continue your current medications the same...  Keep up the good work w/ your exercise program- stay as active as possible...  Call for any questions...  Let's plan a follow up visit in 69mo, sooner if needed for problems.Marland KitchenMarland Kitchen

## 2016-10-27 NOTE — Progress Notes (Signed)
Subjective:     Patient ID: Gregory Eaton, male   DOB: Feb 10, 1946, 71 y.o.   MRN: [id number]  HPI 71 y/o WM ex-smoker w/ COPD/ emphysema (GOLD Stage2-3), 7-86m RLL nodule on CT Chest, CAD- s/p CABG 1998 w/ known carotid & periph vasc dis (s/p Fem-Pop bypass), hx esoph cancer- [facility] [facility] [facility] [facility]   ~  January 30, 2015:  Initial pulmonary consult w/ SN>   89y/o WM, [place] to COPD/ emphysema/ & 7-869mRLL nodule on CT Chest>      Gregory Eaton a Eaton of sinus infection/ URI in April2016 & went to his PCP Gregory Eaton, treated w/ antibiotic & Pred=> improved but didn't get over it so he was given a second antibiotic + Spiriva, Proair, Singulair => finally improved so he tells me he stopped the Spiriva "I'm better"...  He is an ex-smoker, starting in his teens, smoked for 40 yrs up to 2ppd at max for a 55-60 pack-year smoking Eaton... 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     He has a hx HBP, HL, CAD, s/p CABG in 1998, known carotid art dis & ASPVD w/ prev Fem-Pop bypass> followed by Gregory Eaton & his 10/07/14 Epic note is reviewed; meds adjusted, see below...    [facility]... EXAM revealed Afeb, VSS, O2sat=95% on RA;  HEENT- neg, mallampati2;  Chest- decr BS bilat w/o w/r/r;  Heart- RR gr1/6 SEM w/o r/g;  Abd- soft, neg;  Ext- w/o c/c/e...  CT Angio Chest 04/07/14 showed neg for PE, s/p CABG, severe atherosclerosis in Ao, severe bilat emphysema, 8mm12mdule RLL, inflamm changes in lingula, post surg changes related to esophagectomy & gastric pull thru, no signif LNs...  EKG 09/2014 showed NSR, rate64, RBBB, NAD...  [facility]  [facility]D/emphysema, unchanged 7mm 32m nodule, no new lesions, post-op changes after esophagectomy & gastric pull thru...  CXR 12/11/14 showed norm heart size, s/p CABG, COPD/ emphysema, NAD...   Spirometry 01/30/15 showed FVC=3.45 (82%), FEV1=1.76 (54%), %1sec=51, mid-flows reduced at 23% predicted;  Mod severe airflow obstruction w/ GOLD Stage 2-3 COPD....   Ambulatory oxygen saturation test 01/30/15> O2sat=96% on RA at rest w/ pulse=67/min;  He ambulated 3 laps in the office w/ nadir O2sat=93% w/ pulse=85/min...  LABS 7/16:  Alpha-1-Antitrypsin level ==> 133 (83-199 mg/dL) and phenotype= M1M2  IMP/PLAN>>  [name] severe COPD/ emphysema based on his PFTs and heavy smoking hx; he would benefit from ICS/LABA and LAMA Rx=> rec to use SYMBICORT160-2spBid & SPIRIVA daily; he will continue PROAIR-HFA for acute symptoms prn, SINGULAIR10, and MUCINEX '600mg'$ Qid for thick phlegm/ mucus; he needs to maintain a vigorous antireflux regimen w/ his esoph dis & surg; as much as poss he will avoid infections and needs to start a vigorous exercise program (eg- silver sneakers or similar)... Prognosis is guarded w/ his signif co-morbidities (ASHD, ASPVD, Hx esoph ca w/ surg 2010, etc);  He will ret in 57mo w50mollPFTs to check DLCO...  ~  April 03, 2015:  57mo RO5mo SN>         Gregory Eaton on his Advair115-2spBid,  Spiriva daily, Singulair10, and ProairHFA prn;  He has had some trouble w/ cost of the meds and asked to get a copy of his insurance company prescription drug formulary for [place] to review;  He notes that his breathing is stable overall- still mows yard & does ok, not much cough/ sput/ no hemoptysis/ no CP/ no edema;  He has some reflux issues on Protonix40 & reminded of the vigorous antireflux regimen he needs to follow including NPO after dinner, elev HOB 6", take Pepcid ~62mn before bedtime, etc... We reviewed the following medical problems during today's office visit >>     COPD/ emphysema ==>  GOLD Stage 2-3 disease>  Continue Advair115-2spBid, Spiriva daily, Singulair10, ProairHFA prn; get [place] copy of his prescription drug formulary...    7-812mRLL pulmonary nodule on CT Chest>  No change noted from 2015=>[facility] ([facility]- [facility]) & we discussed repeat scan in spring of 2017...    HBP>  Followed by Gregory Eaton ToprolXL50, Amlod5, Cozaar50; BP=130/74 & he denies CP, palpit, SOB, edema...    CAD- s/p 5 vessel CABG, RBBB>  On ASA/ Plavix; [facility]    Carotid stenosis>  Mild bilat carotid stenoses on CDopplers    ASPVD>  s/p right Fem-Pop    H[facility] esophageal [facility]- [facility]>  His GI is Gregory [facility], [facility] and [facility] but no CareEverywhere records are avail...    Other medical issues as noted>  See below... EXAM revealed Afeb, VSS, O2sat=97% on RA;  HEENT- neg, mallampati2;  Chest- decr BS bilat w/o w/r/r;  Heart- RR gr1/6 SEM w/o r/g;  Abd- soft, neg;  Ext- w/o c/c/e...  FullPFT 04/03/15 showed FVC=4.22 (100%), FEV1=2.16 (70%), %1sec=51, mid-flows reduced at 36% predicted; post-bronchodil there was a 3% incr in FEV1; TLC=6.80 (100%), RV=1.79 (75%), RV/TLC=26; DLCO=55% predicted... These PFTs indicate moderately severe airflow obstruction, GOLD Stage2-3 COPD, decr diffusion c/w emphysema...  IMP/PLAN>>  As prev noted- [name] rec to stay on the Advair,Spiriva, Proair; avoid infections, get all approp vaccinations from his PCP; follow the vigorous antireflux regimen as outlined, and plan Eaton w/ usKorean about 10m7moooner if needed...   ~  September 18, 2015:  31mo631mo w/ SN>  Gregory Eaton 06/2015 & saw Gregory Eaton- he had stopped his Symbicort & Spiriva, presented w/ some congestion & wheezing, & given ZPak, Pred, Symbicort, Mucinex, Zantac;  He did well after this therapy until recent similar symptoms- cough, chest congestion, yellow sput, but he denies f/c/s, incr SOB,  chest tightness/ wheezing/ etc;  He is again NOT using his Symbicort regularly as it costs him $150/mo; he has been active- using his exerc bike, caring for wife s/p TKR, yard work & push mows...     COPD/ emphysema ==> GOLD Stage 2-3 disease>  He again stopped his Symbicort & he is off the Spiriva; on Singulair10, ProairHFA prn; needs to get us a[place]opy of his prescription drug formulary (reminded again)...    7-8mm 610m pulmonary nodule on CT Chest>  No change noted from 2015=>2016 ([facility]- they felt [facility] was [facility]) & we discussed repeat CT Chest but he wants to hold-off...    He had Cards appt 05/2015 w/ DrKelly> HBP, CAD, s/pCABG 1998, ASPVD- s/p right Fem-Pop/ occluded left vertebral/ mild carotid dis, HL, [facility]  no changes made. EXAM revealed Afeb, VSS, O2sat=98% on RA;  HEENT- neg, mallampati2;  Chest- decr BS bilat w/o w/r/r;  Heart- RR gr1/6 SEM w/o r/g;  Abd- soft, neg;  Ext- w/o c/c/e;  Neuro- intact... IMP/PLAN>>  I again explained to [name] the need for regular ICS/LABA medication- he needs to contact his insurance regarding a their prescription drug formulary, in the meanwhile we will refill the Symbicort160- 2spBid;  Rx written for ZPak w/ refills per his request...  ~  March 23, 2016:  17moROV w/ SN>  CHimmatreports that he is doing well- no new complaints or concerns;  He has min cough Qam, sm amt clear sput (no color or blood), "it may be reflux" he says; denies SOB/ CP/ palpit/ etc; he does have chr stable DOE (no change- eg. climbing hills);  On SYMBICORT160-2spBid (taking it "most of the time", notes $45 copay now), SINGULAIR10, Mucinex- just taking this prn...  NOTE: his FEV1 was 2.16L Sep2016 & has dropped to 1.76L today => he is asked to add the [facility] med regimen (alternatives- Incruse, TCaprice Renshaw he will check formulary),,,     See prob list above-- HBP, CAD- s/p 5vessel CABG, RBBB, ASPVD- s/p R fem-pop, bilat carotid stenoses>  on ASA/PLAVIX, MetopXL50, Amlod5, Losar75;  BP stable, denies CP, palpit, ch in DOE, edema, etc...     He has hx esoph Ca, s/p esophagectomy & felt to be cured; [facility] ([facility]) seen 03/03/16- plans Eaton EGD/ Colon soon...  EXAM revealed Afeb, VSS, O2sat=98% on RA;  HEENT- neg, mallampati2;  Chest- decr BS bilat w/o w/r/r;  Heart- RR gr1/6 SEM w/o r/g;  Abd- soft, neg;  Ext- w/o c/c/e;  Neuro- intact...  CXR 12/02/15 by Gregory Eaton>  Norm heart size s/p CABG, mild hyperinflation, coarse interstitial markings, small HH w/ A/F level...   [facility]  FLP- all parameters at goals;  Chems- ok w/ Cr=0.74, BS=88;  CBC- wnl...   Spirometry 03/23/16>  FVC=4.10 (101%), FEV1=1.76 (59%), %1sec=43, mid-flows reduced at 20% predicted... This is c/w a severe obstructive ventilatory defect & GOLD Stage 2 CPD... IMP/PLAN>>  His PCP is Gregory Eaton (seen 2-3x/yr, Cards-DrKelly, GI-Gregory Eaton;  We reviewed his deterioration in FEV1 over the last yr & rec to take the Symbicort- 2sp Bid regularly & add-in SPIRIVA once daily every day... we plan routine ROV in 646mo.  ~  May 12, 2016:  7wk ROV & [place]  ChCraigeports that [place] when he became ill w/ chest congestion, SOB, fever to 101, chills, & weak w/ decr in BP; [facility] [place];  He had left lung pneumonia on CXR, WBC=14.8, Sput grew NTF only, BNP=119;  He was treated w/ Levaquin and slowly improved; [place] to [place] 3 [place] to recoup & [place] better...     COPD/ emphysema ==> GOLD Stage 2-3 disease>  He again stopped his Symbicort & he is off the Spiriva; on Singulair10, ProairHFA prn; needs to get [place] copy of his prescription drug formulary (reminded again)...    7-48m57mLL pulmonary nodule on CT Chest>  No change  noted from 2015=>2016 ([facility]- [facility] felt [facility] was [facility]) & we discussed repeat CT Chest but he wants to hold-off...Marland KitchenMarland Kitchen  CAP, Adm for 3d in [facility] 03/2016>  No organism isolated, treated w/ Levaquin and slowly resolved back to baseline...    CARDIAC issues>  HBP, CAD- s/p 5 vessel bypass 1998, RBBB, ASPVD- s/p right Fem-Pop/ occluded left vertebral/ mild carotid dis>  On ASA/ Plavix, + ToprolXL50, Amlod5, Cozaar50 & followed by [facility] for CARDS...     Hx esophageal cancer- s/p esophagectomy w/ gastric pull through at [place] in 2010>  His GI is Gregory Eaton, [facility] and his surgery was done at [facility] in 2010 but no CareEverywhere records are avail...    Other medical issues as noted>  HBP, HL, IFG, GERD, Barrett's, Esoph Ca, colon polyps, divertics, anxiety on Xanax...  EXAM revealed Afeb, VSS, O2sat=95% on RA;  HEENT- neg, mallampati2;  Chest- decr BS bilat w/o w/r/r;  Heart- RR gr1/6 SEM w/o r/g;  Abd- soft, neg;  Ext- w/o c/c/e;  Neuro- intact...  CXR 05/12/16>  Norm heart size, s/p CABG, hyperinflation w/ COPD, linear scarring in LUL & lingula, NAD...   IMP/PLAN>>  MrHall has made a nice recovery from his recent CAP 9/17 in [place]; he is off antibiotics and rec to continue his Symbicort160-2spBid, Spiriva daily, Singulair10, Mucinex '1200mg'$  Bid fluids, etc...  ~  July 27, 2016:  2-44moROV & CUlyseeswas re-hospitalized 11/7 - 06/03/16 by [facility] w/ aspiration pneumonia & severe on-going reflux s/[facility];  He was treated w/ IV Zosyn=> Augmentin, eval by speech Path, sent home w/ new home O2;  Since disch he has been seen by CARDS-DrKelly> HBP, CAD, s/pCABG1998, HL, [place] known ASPVD w/ occluded left vertebral art, mild carotid dis, s/p fem-pop bypass & occlusive right SFA dis;  Stable on ASA/Plavix, ToprolXL, Amlod, Losar, Crestor;  He was also eval post hosp by TP> improved but seen again 07/13/16 w/ incr cough,  thick mucus, & CXR showed improved aeration/ resolving pneumonia (rec same inhalers + incr ?Mucinex, fluids, & Pred taper)...     SEE PROB LIST ABOVE => reviewed in detail w/ pt...    CSalvatoris back to baseline- sl cough, small amt clear sput, no hemoptysis, SOB/DOE is stable (eg-he bikes 357m/d);  Problems as listed above- on Symbicort 160-2spBid, Spiriva daily, Singulair10, and ALBUT NEBS prn EXAM revealed Afeb, VSS, O2sat=98% on RA;  HEENT- neg, mallampati2;  Chest- decr BS bilat w/o w/r/r;  Heart- RR gr1/6 SEM w/o r/g;  Abd- soft, neg;  Ext- w/o c/c/e;  Neuro- intact...  CXR 07/13/16>  S/p CABG & atherosclerotic changes in Ao, background COPD/E w/ hyperinflation & scarring, marked improvement in RUL/RLL areas...  LABS 05/2016>  Chems- ok w/ BS~110-160;  CBC- ok x WBC~10-15K;   IMP/PLAN>>  He is reminded to use his inhalers regularly & encouraged to use the NEBULIZER as needed;  Needs to incr the Mucinex'600mg'$  to 2Bidf w/ fluids;  O2sats are all in the 90s on RA- [facility];  Rec to continue his exercise program 7 call for any set backs in his status... we plan ROV in 57m28mo ~  October 27, 2016:  57mo64mo & Jatavius reports a good interval- breathing is stable, no new complaints or concerns;  He notes min cough, scant intermit sput (clear), no hemoptysis, chr stable SOB/DOE w/ activities like rushing about, ok w/ ADLs, he's been painting a room, somewhat lim by LBP;  He denies any swallowing  difficulty- eating well, maintaining wt, no reflux/ regurg/ dysphagia/ n/v etc... we reviewed the following medical problems during today's office visit >>     COPD/ emphysema ==> GOLD Stage 2-3 disease w/ severe bilat emphysema on CT & normal A1AT level & phenotypeM1M2>  On Symbicort160- 2spBid, Spiriva daily, Singulair10, NEBS w/ Albut Qid prn & he hasn't needed this in months...    7-42m RLL pulmonary nodule on CT Chest>  No change noted from 2015=>2016 ([facility]- [facility] felt [facility] was [facility])  & serial CXRs w/o nodules seen (Last CXR 12/17 showed s/p CABG & atherosclerotic changes in Ao, background COPD/E w/ hyperinflation & scarring...    HBP>  [place] Top[place], Amlod5, Cozaar100; BP=140/72 & he denies CP, palpit, SOB, edema...    CAD- s/p 5 vessel CABG, RBBB>  On ASA/ Plavix; [facility]-     Carotid stenosis & occluded left vertebral>  [place] in bulbs and 0-39% R-ICA stenosis and 50-69% L-ICA stenosis; he has a bruit on the right side on exam...    ASPVD>  s/p right Fem-Pop in 1993;  Last LE arterial dopplers 06/2015 showed stable patent left Fem-Pop bypass graft with irregularities & three vessel run-off on the left; ABIs= 0.99-right & 1.1-left...    [facility]- [facility]>  His GI is Gregory E[facility], [facility] but no [facility]Everywhere records are avail...    Other medical issues as noted>  See below... EXAM revealed Afeb, VSS, O2sat=96% on RA;  HEENT- neg, mallampati2;  Chest- decr BS bilat w/o w/r/r;  Heart- RR gr1/6 SEM w/o r/g;  Abd- soft, neg;  Ext- w/o c/c/e;  Neuro- intact... IMP/PLAN>>  CSammieis stable w/ his severe cardiovasc & pulmonary issues as above;  He will continue the same meds for now &is encouraged to exercise as best he can;  He is up-to-date on vaccinations and he will call prn any problems w/ rov planned in 667mo.    Past Medical Eaton  Diagnosis Date  . Esophageal carcinoma 03/2009    T1N1M0  . Hypertension >> on Metoprolol, Amlodipine, Losartan   . Barrett's esophagus   . GERD (gastroesophageal reflux disease) >> on Protonix40 & Carafate 1gmQid   . CAD (coronary artery disease)   . PVD (peripheral vascular disease)   . Hyperlipidemia >> on Crestor10, Fish Oil + diet   . Adenomatous colon polyp 03/2009    Last colonoscopy by Dr. RoGala Romney .  Diverticulosis   . Tobacco abuse   . Tachyarrhythmia 1999    [facility] COPD (chronic obstructive pulmonary disease)     Severe emphysema per CT  . Eaton of echocardiogram 08/27/2009    EF >55%  . Eaton of nuclear stress test 11/24/2011    lexiscan; normal perfusion; low risk scan; non-diagnostic for ischemia  . Eaton of Doppler ultrasound 11/09/2011    03/2014- 50-69% L ICA stenosis;carotid doppler; L bulb/prox ICA 0-49% diameter reduction; L vertebral artery - occlusive ds; L ECA  demonstrates severe amount of fibrous plaque  . Eaton of Doppler ultrasound 11/09/11    LEAs; R ABI - mod art. insuff.; L ABI normal at rest; R SFA - occlusive ds; L SFA - occlusive ds; patent fem-pop graft  . Adenomatous polyp of colon 11/03/2010  . Left  carotid artery stenosis 04/08/2014  . Pulmonary nodule, right 04/08/2014    8 mm-incidental finding on CT  . DM type 2 (diabetes mellitus, type 2) >> on diet alone 04/09/2014    A1c 6.5 (04/08/14)     Past Surgical E[facility]:  Procedure Laterality Date  . COLONOSCOPY  03/17/2009   Dr.Rourk- normal rectum, sigmoid diverticula, some pale sigmoid mucosa with diffuse petechiae. pedunculated polyp at the splenic flexure, remainder of colonic mucosa appeared normal. bx= adenomatous polyp  . COLONOSCOPY  04/19/2003   Dr.Rehman- few diverticu;a at the sigmoid colon, 3 small polyps, one at the transverse colon and 2 at the sigmoid, small external hemorrhoids. bx report not available.   . CORONARY ARTERY BYPASS GRAFT  1998   [name]  . ESOPHAGECTOMY  2010   [facility] Dr. [name]  . ESOPHAGOGASTRODUODENOSCOPY  11/25/2010   Dr.Rourk- s/p esophagectomy with gastric pull-up, esophageal erosions straddling the surgical anastomosis, salmon colored epithelium coming up a good centimeter to a centimeter and a half above the suture line, islands of salmon colored epithelium in the most poximal residual esophagus, remainder of gastric mucosa appeared  normal. bx= swamous &gastric glandular mucosa w/chronic active inflammation  . ESOPHAGOGASTRODUODENOSCOPY  03/17/2009   Dr.Rourk- 4cm segment of salmon-colored epithlium distal esophagus suspicious for barretts esophagus. area of suspicious nodularity w/in this segment bx seperately. small to moderate size hiatal hernia, o/w normal stomach D1 and D2 bx=adenocarcinoma  . FEMORAL-POPLITEAL BYPASS GRAFT  1993   occlusive ds in R SFA  . GASTRIC PULL THROUGH  2010   With esophagectomy  . LIPOMA EXCISION  2010    Outpatient Encounter Prescriptions as of 10/27/2016  Medication Sig  . albuterol (PROVENTIL) (2.5 MG/3ML) 0.083% nebulizer solution Take 3 mLs (2.5 mg total) by nebulization every 6 (six) hours as needed for wheezing or shortness of breath. Patient will also need nebulizer machine please  . ALPRAZolam (XANAX) 0.5 MG tablet Take 1 tablet (0.5 mg total) by mouth at bedtime.  Marland Kitchen amLODipine (NORVASC) 5 MG tablet TAKE ONE TABLET BY MOUTH EVERY DAY.  Marland Kitchen aspirin 81 MG EC tablet Take 81 mg by mouth daily.    . budesonide-formoterol (SYMBICORT) 160-4.5 MCG/ACT inhaler Inhale 2 puffs into the lungs 2 (two) times daily.  . clopidogrel (PLAVIX) 75 MG tablet TAKE ONE (1) TABLET BY MOUTH EVERY DAY  . latanoprost (XALATAN) 0.005 % ophthalmic solution Place 1 drop into both eyes at bedtime.   Marland Kitchen losartan (COZAAR) 100 MG tablet TAKE ONE (1) TABLET EACH DAY  . metoprolol succinate (TOPROL-XL) 50 MG 24 hr tablet Take 1 tablet (50 mg total) by mouth daily.  . montelukast (SINGULAIR) 10 MG tablet Take 1 tablet by mouth daily.  . Multiple Vitamins-Minerals (CENTRUM SILVER PO) Take 1 tablet by mouth daily.    . nitroGLYCERIN (NITROSTAT) 0.4 MG SL tablet Place 1 tablet (0.4 mg total) under the tongue every 5 (five) minutes as needed for chest pain.  . pantoprazole (PROTONIX) 40 MG tablet TAKE ONE (1) TABLET EACH DAY  . ranitidine (ZANTAC) 150 MG tablet Take 150 mg by mouth at bedtime.  . rosuvastatin (CRESTOR) 10 MG  tablet TAKE ONE (1) TABLET BY MOUTH EVERY DAY  . sucralfate (CARAFATE) 1 G tablet Take 1 tablet (1 g total) by mouth 4 (four) times daily -  with meals and at bedtime.  . timolol (TIMOPTIC) 0.5 % ophthalmic solution Place 1 drop into both eyes daily.   Marland Kitchen tiotropium (SPIRIVA) 18 MCG inhalation capsule Place 1 capsule (  18 mcg total) into inhaler and inhale daily.  . budesonide-formoterol (SYMBICORT) 160-4.5 MCG/ACT inhaler Inhale 2 puffs into the lungs 2 (two) times daily.  . Tiotropium Bromide Monohydrate (SPIRIVA RESPIMAT) 1.25 MCG/ACT AERS Inhale 2 puffs into the lungs daily.  . [DISCONTINUED] budesonide-formoterol (SYMBICORT) 160-4.5 MCG/ACT inhaler Inhale 2 puffs into the lungs 2 (two) times daily. (Patient not taking: Reported on 10/27/2016)   No facility-administered encounter medications on file as of 10/27/2016.     Allergies  Allergen Reactions  . Altace [Ramipril] Cough  . Prednisone Other (See Comments)    "throat broke out"     Current Medications, Allergies, Past Medical [place], Past Surgical [facility], Family E[place], and Social Eaton were reviewed in [employer] record.   Review of Systems            All symptoms NEG except where BOLDED >>  Constitutional:  F/C/S, fatigue, anorexia, unexpected weight change. HEENT:  HA, visual changes, hearing loss, earache, nasal symptoms, sore throat, mouth sores, hoarseness. Resp:  cough, sputum, hemoptysis; SOB, tightness, wheezing. Cardio:  CP, palpit, DOE, orthopnea, edema. GI:  N/V/D/C, blood in stool; reflux, abd pain, distention, gas. GU:  dysuria, freq, urgency, hematuria, flank pain, voiding difficulty. MS:  joint pain, swelling, tenderness, decr ROM; neck pain, back pain, etc. Neuro:  HA, tremors, seizures, dizziness, syncope, weakness, numbness, gait abn. Skin:  suspicious lesions or skin rash. Heme:  adenopathy, bruising, bleeding. Psyche:  confusion, agitation, sleep disturbance, hallucinations,  anxiety, depression suicidal.   Objective:   Physical Exam      Vital Signs:  Reviewed...   General:  WD, WN, 71 y/o WM in NAD; alert & oriented; pleasant & cooperative... HEENT:  [place]/AT; Conjunctiva- pink, Sclera- nonicteric, EOM-wnl, PERRLA, EACs-clear, TMs-wnl; NOSE-clear; THROAT-clear & wnl.  Neck:  Supple w/ fair ROM; no JVD; normal carotid impulses w/o bruits; no thyromegaly or nodules palpated; no lymphadenopathy.  Chest:  decr BS bilat, without wheezes, rales, or rhonchi heard. Heart:  Regular Rhythm; gr1/6 SEM w/o rubs or gallops detected. Abdomen:  Soft & nontender- no guarding or rebound; normal bowel sounds; no organomegaly or masses palpated. Ext:  decrROM; without deformities +arthritic changes; no varicose veins, +venous insuffic, or edema;  Pulses intact w/o bruits. Neuro:  No focal neuro deficits; sensory testing normal; gait normal & balance OK. Derm:  No lesions noted; no rash etc. Lymph:  No cervical, supraclavicular, axillary, or inguinal adenopathy palpated.   Assessment:      IMP >>     COPD/ emphysema ==> GOLD Stage 2-3 disease    7-71m RLL pulmonary nodule on CT Chest    [place]    HBP    CAD- s/p 5 vessel CABG    Carotid stenosis    ASPVD- s/p Fem-Pop    Hx esophageal [facility]    Other medical issues as noted...   PLAN >>  01/30/15>  CCanehas mod severe COPD/ emphysema based on his PFTs and heaqvy smoking hx; he would benefit from ICS/LABA and LAMA Rx=> rec to use SYMBICORT160-2spBid & SPIRIVA daily; he will continue PROAIR-HFA for acute symptoms prn, SINGULAIR10, and MUCINEX '600mg'$ Qid for thick phlegm/ mucus;; he needs to maintain a vigorous antireflux regimen w/ his esoph dis & surg; as much as poss he will avoid infections and needs to start a vigorous exercise program (eg- silver sneakers or similar)... Prognosis is guarded  w/ his signif  co-morbidities (ASHD, ASPVD, Hx esoph ca w/ surg 2010, etc);  He will ret in 40mow/ FullPFTs to check DLCO. 04/03/15>  As prev noted- COlawaleis rec to stay on the Advair,Spiriva, Proair; avoid infections, get all approp vaccinations from his PCP; follow the vigorous antireflux regimen as outlined, and plan Eaton w/ uKoreain about 49mosooner if needed. 09/18/15>   I again explained to ChBudhe need for regular ICS/LABA medication- he needs to contact his insurance regarding a their prescription drug formulary, in the meanwhile we will refill the Symbicort160- 2spBid;  Rx written for ZPak w/ refills per his request. 05/12/16>   [place] [facility], treated w/ Levaquin & made a slow but steady recovery; again requested to continue regular Rx w/ his Symbicort160-2spBid, Spiriva daily, Singulair10, Mucinex '1200mg'$  Bid fluids, etc 11/7 - 06/03/16>  [facility] w/ asp pneumonia & severe on-going reflux s/p esophageal [facility]/ [facility]; he responded to antibiotics w/ Zosyn=> Augmentin, and CXR resolved over the next month... 07/27/16>  He is back to baseline, & reminded to use his inhalers regularly & encouraged to use the NEBULIZER as needed;  Needs to incr the Mucinex'600mg'$  to 2Bidf w/ fluids;  O2sats are all in the 90s on RA- [facility];  Rec to continue his exercise program & call for any set backs in his status. 10/27/16>   Jabril is stable w/ his severe cardiovasc & pulmonary issues as above;  He will continue the same meds for now &is encouraged to exercise as best he can;  He is up-to-date on vaccinations and he will call prn any problems w/ rov planned in 73m13mo   Plan:       Patient's Medications  New Prescriptions   BUDESONIDE-FORMOTEROL (SYMBICORT) 160-4.5 MCG/ACT INHALER    Inhale 2 puffs into the lungs 2 (two) times daily.   TIOTROPIUM BROMIDE MONOHYDRATE (SPIRIVA  RESPIMAT) 1.25 MCG/ACT AERS    Inhale 2 puffs into the lungs daily.  Previous Medications   ALBUTEROL (PROVENTIL) (2.5 MG/3ML) 0.083% NEBULIZER SOLUTION    Take 3 mLs (2.5 mg total) by nebulization every 6 (six) hours as needed for wheezing or shortness of breath. Patient will also need nebulizer machine please   ALPRAZOLAM (XANAX) 0.5 MG TABLET    Take 1 tablet (0.5 mg total) by mouth at bedtime.   AMLODIPINE (NORVASC) 5 MG TABLET    TAKE ONE TABLET BY MOUTH EVERY DAY.   ASPIRIN 81 MG EC TABLET    Take 81 mg by mouth daily.     BUDESONIDE-FORMOTEROL (SYMBICORT) 160-4.5 MCG/ACT INHALER    Inhale 2 puffs into the lungs 2 (two) times daily.   CLOPIDOGREL (PLAVIX) 75 MG TABLET    TAKE ONE (1) TABLET BY MOUTH EVERY DAY   LATANOPROST (XALATAN) 0.005 % OPHTHALMIC SOLUTION    Place 1 drop into both eyes at bedtime.    LOSARTAN (COZAAR) 100 MG TABLET    TAKE ONE (1) TABLET EACH DAY   METOPROLOL SUCCINATE (TOPROL-XL) 50 MG 24 HR TABLET    Take 1 tablet (50 mg total) by mouth daily.   MONTELUKAST (SINGULAIR) 10 MG TABLET    Take 1 tablet by mouth daily.   MULTIPLE VITAMINS-MINERALS (CENTRUM SILVER PO)    Take 1 tablet by mouth daily.  NITROGLYCERIN (NITROSTAT) 0.4 MG SL TABLET    Place 1 tablet (0.4 mg total) under the tongue every 5 (five) minutes as needed for chest pain.   PANTOPRAZOLE (PROTONIX) 40 MG TABLET    TAKE ONE (1) TABLET EACH DAY   RANITIDINE (ZANTAC) 150 MG TABLET    Take 150 mg by mouth at bedtime.   ROSUVASTATIN (CRESTOR) 10 MG TABLET    TAKE ONE (1) TABLET BY MOUTH EVERY DAY   SUCRALFATE (CARAFATE) 1 G TABLET    Take 1 tablet (1 g total) by mouth 4 (four) times daily -  with meals and at bedtime.   TIMOLOL (TIMOPTIC) 0.5 % OPHTHALMIC SOLUTION    Place 1 drop into both eyes daily.    TIOTROPIUM (SPIRIVA) 18 MCG INHALATION CAPSULE    Place 1 capsule (18 mcg total) into inhaler and inhale daily.  Modified Medications   No medications on file  Discontinued Medications    BUDESONIDE-FORMOTEROL (SYMBICORT) 160-4.5 MCG/ACT INHALER    Inhale 2 puffs into the lungs 2 (two) times daily.

## 2016-11-05 ENCOUNTER — Other Ambulatory Visit: Payer: Self-pay | Admitting: Cardiovascular Disease

## 2016-11-05 DIAGNOSIS — I739 Peripheral vascular disease, unspecified: Secondary | ICD-10-CM

## 2016-11-11 ENCOUNTER — Ambulatory Visit (HOSPITAL_COMMUNITY)
Admission: RE | Admit: 2016-11-11 | Discharge: 2016-11-11 | Disposition: A | Discharge status: Home / Self Care | Payer: Medicare Other | Source: Ambulatory Visit | Attending: Cardiology | Admitting: Cardiology

## 2016-11-11 DIAGNOSIS — I739 Peripheral vascular disease, unspecified: Secondary | ICD-10-CM | POA: Diagnosis not present

## 2016-11-17 DIAGNOSIS — M25559 Pain in unspecified hip: Secondary | ICD-10-CM | POA: Diagnosis not present

## 2016-11-18 ENCOUNTER — Ambulatory Visit (HOSPITAL_COMMUNITY)
Admission: RE | Admit: 2016-11-18 | Discharge: 2016-11-18 | Disposition: A | Discharge status: Home / Self Care | Payer: Medicare Other | Source: Ambulatory Visit | Attending: Internal Medicine | Admitting: Internal Medicine

## 2016-11-18 ENCOUNTER — Other Ambulatory Visit: Payer: Self-pay | Admitting: Internal Medicine

## 2016-11-18 DIAGNOSIS — M160 Bilateral primary osteoarthritis of hip: Secondary | ICD-10-CM | POA: Diagnosis not present

## 2016-11-18 DIAGNOSIS — M25552 Pain in left hip: Secondary | ICD-10-CM

## 2016-11-18 DIAGNOSIS — M1611 Unilateral primary osteoarthritis, right hip: Secondary | ICD-10-CM | POA: Diagnosis not present

## 2016-11-18 DIAGNOSIS — M1612 Unilateral primary osteoarthritis, left hip: Secondary | ICD-10-CM | POA: Diagnosis not present

## 2016-11-22 ENCOUNTER — Other Ambulatory Visit: Payer: Self-pay | Admitting: Cardiovascular Disease

## 2016-11-26 ENCOUNTER — Other Ambulatory Visit: Payer: Self-pay | Admitting: Cardiovascular Disease

## 2016-11-30 ENCOUNTER — Encounter: Payer: Self-pay | Admitting: *Deleted

## 2016-12-01 DIAGNOSIS — J449 Chronic obstructive pulmonary disease, unspecified: Secondary | ICD-10-CM | POA: Diagnosis not present

## 2016-12-01 DIAGNOSIS — M25559 Pain in unspecified hip: Secondary | ICD-10-CM | POA: Diagnosis not present

## 2016-12-01 DIAGNOSIS — Z6824 Body mass index (BMI) 24.0-24.9, adult: Secondary | ICD-10-CM | POA: Diagnosis not present

## 2017-01-06 DIAGNOSIS — E785 Hyperlipidemia, unspecified: Secondary | ICD-10-CM | POA: Diagnosis not present

## 2017-01-06 DIAGNOSIS — I10 Essential (primary) hypertension: Secondary | ICD-10-CM | POA: Diagnosis not present

## 2017-01-11 ENCOUNTER — Other Ambulatory Visit: Payer: Self-pay | Admitting: Cardiovascular Disease

## 2017-01-25 ENCOUNTER — Ambulatory Visit (INDEPENDENT_AMBULATORY_CARE_PROVIDER_SITE_OTHER): Payer: Medicare Other | Admitting: Cardiovascular Disease

## 2017-01-25 ENCOUNTER — Encounter: Payer: Self-pay | Admitting: Cardiovascular Disease

## 2017-01-25 VITALS — BP 162/76 | HR 65 | Ht 68.0 in | Wt 161.2 lb

## 2017-01-25 DIAGNOSIS — I679 Cerebrovascular disease, unspecified: Secondary | ICD-10-CM | POA: Diagnosis not present

## 2017-01-25 DIAGNOSIS — E785 Hyperlipidemia, unspecified: Secondary | ICD-10-CM | POA: Diagnosis not present

## 2017-01-25 DIAGNOSIS — I2510 Atherosclerotic heart disease of native coronary artery without angina pectoris: Secondary | ICD-10-CM | POA: Diagnosis not present

## 2017-01-25 DIAGNOSIS — I739 Peripheral vascular disease, unspecified: Secondary | ICD-10-CM

## 2017-01-25 DIAGNOSIS — J439 Emphysema, unspecified: Secondary | ICD-10-CM

## 2017-01-25 DIAGNOSIS — I6522 Occlusion and stenosis of left carotid artery: Secondary | ICD-10-CM | POA: Diagnosis not present

## 2017-01-25 DIAGNOSIS — G8929 Other chronic pain: Secondary | ICD-10-CM | POA: Diagnosis not present

## 2017-01-25 DIAGNOSIS — M545 Low back pain: Secondary | ICD-10-CM | POA: Diagnosis not present

## 2017-01-25 DIAGNOSIS — I10 Essential (primary) hypertension: Secondary | ICD-10-CM

## 2017-01-25 DIAGNOSIS — M25552 Pain in left hip: Secondary | ICD-10-CM | POA: Diagnosis not present

## 2017-01-25 DIAGNOSIS — Z8501 Personal history of malignant neoplasm of esophagus: Secondary | ICD-10-CM

## 2017-01-25 DIAGNOSIS — Z951 Presence of aortocoronary bypass graft: Secondary | ICD-10-CM

## 2017-01-25 MED ORDER — PANTOPRAZOLE SODIUM 40 MG PO TBEC: DELAYED_RELEASE_TABLET | ORAL | 1 refills | Status: DC

## 2017-01-25 NOTE — Progress Notes (Signed)
Patient ID: Gregory Eaton, male   DOB: [birth year], 71 y.o.   MRN: [id number]   PCP: Gregory Eaton  HPI: Gregory Eaton is a 71 y.o. male presents to the office today for an 8 month cardiology evaluation.  Gregory Eaton has CAD and underwent CABG revascularization surgery by Gregory Eaton in 1998. In December 2010 he underwent esophageal cancer surgery at [facility]. Additional problems include hypertension as well as hyperlipidemia in addition to peripheral vascular disease. He has documented occluded left vertebral artery with normal antegrade flow the left external carotid and did have mild carotid stenoses are noted on his last Doppler study in detecting April 2013. He also is status post femoropopliteal bypass surgery and has occlusive disease in his right SFA.   A followup nuclear perfusion study on 07/24/2013 continued to reveal normal perfusion without scar or ischemia, 17 years after his CABG revascularization surgery. He had also undergone followup carotid studies which again showed an occluded left vertebral artery and mild disease in his right and left internal carotid system with narrowings less than 49%. He also is status post femoropopliteal bypass surgery. His last LE Doppler study from 07/10/2013 showed ABIs of 1.4 bilaterally at his ankles. He did have occlusive disease with reconstitution above the knee and the right SFA. His ABI values were now noncompressible.  Laboratory in August 2015 by his primary physician revealed mildly increased SGPT at 71 and an elevated calcium of 11.6.  He was hospitalized in September 2015 with pneumonia.  Followup calcium level was normal at 9.2.  LFTs were normal.  He did have a followup carotid duplex scan, which again showed a 50-69% stenosis of the left internal carotid artery.  Additionally, there was preocclusive stenosis and left external carotid.  He was hospitalized from November 7 - June 03, 2016 and was felt to have sepsis due to  pneumonia.  He initially was treated with vancomycin and Zosyn. He has a history of remote esophageal cancer and is status post surgery.  He has GERD which has been controlled with Protonix.    This.  I last saw him, he has continued to feel well.  He denies recurrent chest pain.  He sees Gregory Eaton call for primary care and had laboratory.  He saw Gregory Eaton 2018 for his COPD/emphysema.  There is no change in his prior right lower lobe pulmonary nodules.  He underwent lower extremity Doppler studies in April 2018, which revealed his known SFA disease.  The right ABI was within normal limits with abnormal waveforms.  The left ABI was not obtainable due to noncompressible vessels.  Bilateral great toe pressures were abnormal.  He denies any change in his claudication.  He denies any chest pain and continues to be on dual antiplatelet therapy.  He has been on amlodipine 5 mg, losartan 75 mg, Toprol 50 mg for hypertension  With his history of lung disease.  He is on Spiriva and  Singulair therapy for COPD.  He continues to be on crestor for hyperlipidemia. He presents for follow up evaluation.  Past Medical History:  Diagnosis Date  . Adenomatous colon polyp 03/2009   Last colonoscopy by Gregory Eaton   . Adenomatous polyp 2010  . Adenomatous polyp of colon 11/03/2010  . Barrett's esophagus   . CAD (coronary artery disease)   . COPD (chronic obstructive pulmonary disease) (HCC)    Severe emphysema per CT  . Diverticulosis   . DM type 2 (diabetes mellitus,  type 2) ([place]) 04/09/2014   A1c 6.5 (04/08/14)   . Esophageal carcinoma ([place]) 03/2009   T1N1M0  . GERD (gastroesophageal reflux disease)   . History of Doppler ultrasound 11/09/2011   03/2014- 50-69% L ICA stenosis;carotid doppler; L bulb/prox ICA 0-49% diameter reduction; L vertebral artery - occlusive ds; L ECA  demonstrates severe amount of fibrous plaque  . History of Doppler ultrasound 11/09/11   LEAs; R ABI - mod art. insuff.; L ABI normal at rest; R  SFA - occlusive ds; L SFA - occlusive ds; patent fem-pop graft  . History of echocardiogram 08/27/2009   EF >55%  . History of nuclear stress test 11/24/2011   lexiscan; normal perfusion; low risk scan; non-diagnostic for ischemia  . Hyperlipidemia   . Hypertension   . Left carotid artery stenosis 04/08/2014  . Pulmonary nodule, right 04/08/2014   2.8 mm-incidental finding on CT  . PVD (peripheral vascular disease) ([place])   . Tachyarrhythmia 1999   Status post ablation at [facility]  . Tobacco abuse     Past Surgical History:  Procedure Laterality Date  . COLONOSCOPY  03/17/2009   Gregory Eaton- normal rectum, sigmoid diverticula, some pale sigmoid mucosa with diffuse petechiae. pedunculated polyp at the splenic flexure, remainder of colonic mucosa appeared normal. bx= adenomatous polyp  . COLONOSCOPY  04/19/2003   Gregory Eaton- few diverticu;a at the sigmoid colon, 3 small polyps, one at the transverse colon and 2 at the sigmoid, small external hemorrhoids. bx report not available.   . CORONARY ARTERY BYPASS GRAFT  1998   Gregory Eaton  . ESOPHAGECTOMY  2010   [facility] Gregory E[facility]  . ESOPHAGOGASTRODUODENOSCOPY  11/25/2010   Gregory Eaton- s/p esophagectomy with gastric pull-up, esophageal erosions straddling the surgical anastomosis, salmon colored epithelium coming up a good centimeter to a centimeter and a half above the suture line, islands of salmon colored epithelium in the most poximal residual esophagus, remainder of gastric mucosa appeared normal. bx= swamous &gastric glandular mucosa w/chronic active inflammation  . ESOPHAGOGASTRODUODENOSCOPY  03/17/2009   Gregory Eaton- 4cm segment of salmon-colored epithlium distal esophagus suspicious for barretts esophagus. area of suspicious nodularity w/in this segment bx seperately. small to moderate size hiatal hernia, o/w normal stomach D1 and D2 bx=adenocarcinoma  . FEMORAL-POPLITEAL BYPASS GRAFT  1993   occlusive ds in R SFA  . GASTRIC PULL THROUGH  2010   With  esophagectomy  . LIPOMA EXCISION  2010    Allergies  Allergen Reactions  . Altace [Ramipril] Cough  . Prednisone Other (See Comments)    "throat broke out"     Current Outpatient Prescriptions  Medication Sig Dispense Refill  . albuterol (PROVENTIL) (2.5 MG/3ML) 0.083% nebulizer solution Take 3 mLs (2.5 mg total) by nebulization every 6 (six) hours as needed for wheezing or shortness of breath. Patient will also need nebulizer machine please 75 mL 12  . ALPRAZolam (XANAX) 0.5 MG tablet Take 1 tablet (0.5 mg total) by mouth at bedtime. 30 tablet 0  . amLODipine (NORVASC) 5 MG tablet TAKE ONE (1) TABLET BY MOUTH EVERY DAY 90 tablet 1  . aspirin 81 MG EC tablet Take 81 mg by mouth daily.      . budesonide-formoterol (SYMBICORT) 160-4.5 MCG/ACT inhaler Inhale 2 puffs into the lungs 2 (two) times daily.    . clopidogrel (PLAVIX) 75 MG tablet Take 1 tablet (75 mg total) by mouth daily. 90 tablet 1  . latanoprost (XALATAN) 0.005 % ophthalmic solution Place 1 drop into both eyes at bedtime.     Marland Kitchen  losartan (COZAAR) 100 MG tablet TAKE ONE (1) TABLET EACH DAY 90 tablet 1  . metoprolol succinate (TOPROL-XL) 50 MG 24 hr tablet TAKE ONE TABLET ONCE DAILY 90 tablet 1  . montelukast (SINGULAIR) 10 MG tablet Take 1 tablet by mouth daily.    . Multiple Vitamins-Minerals (CENTRUM SILVER PO) Take 1 tablet by mouth daily.      . nitroGLYCERIN (NITROSTAT) 0.4 MG SL tablet Place 1 tablet (0.4 mg total) under the tongue every 5 (five) minutes as needed for chest pain. 25 tablet 3  . pantoprazole (PROTONIX) 40 MG tablet TAKE ONE (1) TABLET EACH DAY 90 tablet 1  . ranitidine (ZANTAC) 150 MG tablet Take 150 mg by mouth at bedtime.    . rosuvastatin (CRESTOR) 10 MG tablet Take 1 tablet (10 mg total) by mouth daily. 90 tablet 1  . sucralfate (CARAFATE) 1 G tablet Take 1 tablet (1 g total) by mouth 4 (four) times daily -  with meals and at bedtime. 40 tablet 0  . timolol (TIMOPTIC) 0.5 % ophthalmic solution Place 1  drop into both eyes daily.     Marland Kitchen tiotropium (SPIRIVA) 18 MCG inhalation capsule Place 1 capsule (18 mcg total) into inhaler and inhale daily. 30 capsule 6  . Tiotropium Bromide Monohydrate (SPIRIVA RESPIMAT) 1.25 MCG/ACT AERS Inhale 2 puffs into the lungs daily. 2 Inhaler 0   No current facility-administered medications for this visit.     Socially he is widowed has 2 children. He quit smoking in 1997.  ROS General: Negative; No fevers, chills, or night sweats;  HEENT: Negative; No changes in vision or hearing, sinus congestion, difficulty swallowing Pulmonary: Positive for  COPD , pulmonary nodule; and recent pneumonia. Cardiovascular: Negative; No chest pain, presyncope, syncope, palpitations GI: Positive for GERD; No nausea, vomiting, diarrhea, or abdominal pain GU: Negative; No dysuria, hematuria, or difficulty voiding Musculoskeletal: Negative; no myalgias, joint pain, or weakness Hematologic/Oncology: Negative; no easy bruising, bleeding Endocrine: Negative; no heat/cold intolerance; no diabetes Neuro: Negative; no changes in balance, headaches Skin: Negative; No rashes or skin lesions Psychiatric: Negative; No behavioral problems, depression Sleep: Negative; No snoring, daytime sleepiness, hypersomnolence, bruxism, restless legs, hypnogognic hallucinations, no cataplexy   PE BP (!) 162/76   Pulse 65   Ht 5' 8" (1.727 m)   Wt 161 lb 3.2 oz (73.1 kg)   BMI 24.51 kg/m    Repeat blood pressure by me 142/76  Wt Readings from Last 3 Encounters:  01/25/17 161 lb 3.2 oz (73.1 kg)  10/27/16 161 lb (73 kg)  07/27/16 154 lb 8 oz (70.1 kg)    General: Alert, oriented, no distress.  Skin: normal turgor, no rashes, warm and dry HEENT: Normocephalic, atraumatic. Pupils equal round and reactive to light; sclera anicteric; extraocular muscles intact;  Nose without nasal septal hypertrophy Mouth/Parynx benign; Mallinpatti scale 2 Neck: No JVD, soft left carotid bruit; normal  carotid upstroke Lungs: clear to ausculatation and percussion; no wheezing or rales Chest wall: without tenderness to palpitation Heart: PMI not displaced, RRR, s1 s2 normal, 1/6 systolic murmur, no diastolic murmur, no rubs, gallops, thrills, or heaves Abdomen: soft, nontender; no hepatosplenomehaly, BS+; abdominal aorta nontender and not dilated by palpation. Back: no CVA tenderness Pulses 2+, but decreased distally Musculoskeletal: full range of motion, normal strength, no joint deformities Extremities: no clubbing cyanosis or edema, Homan's sign negative  Neurologic: grossly nonfocal; Cranial nerves grossly wnl Psychologic: Normal mood and affect   ECG (independently read by me): Normal sinus rhythm at  65 bpm.  Right bundle-branch block with repolarization changes.  Borderline first-degree AV block with a PR interval 22 ms.  06/23/2016 ECG (independently read by me): normal sinus rhythm at 86 bpm.  Right bundle branch block with repolarization changes.  Small Q wave inferiorly.  November 2016 ECG (independently read by me):  Normal sinus rhythm with right bundle branch block and repolarization changes. First-degree AV block with PR interval of 210 ms.  March 2016 ECG (independently read by me): Normal sinus rhythm at 64 bpm.  Right bundle branch block with repolarization changes.  First-degree AV block with a PR interval of 228 ms.  September 2015 ECG (independently read by me): Sinus rhythm with first-degree AV block.  PR interval 214 ms.  Right bundle branch block with repolarization changes.  January 2015 ECG (independently read by me): Sinus rhythm 86 beats per minute. PR interval 200 ms.  Prior ECG of July 2014 :Sinus rhythm at 70 beats per minute. First degree AV block. 1 isolated unifocal PVC.  LABS:  BMP Latest Ref Rng & Units 06/02/2016 06/01/2016 05/31/2016  Glucose 65 - 99 mg/dL 115(H) 108(H) 160(H)  BUN 6 - 20 mg/dL _0 Creatinine 0.61 - 1.24 mg/dL 0.85 0.89 0.81   Sodium 135 - 145 mmol/L 137 136 133(L)  Potassium 3.5 - 5.1 mmol/L 4.0 3.5 3.9  Chloride 101 - 111 mmol/L 104 103 103  CO2 22 - 32 mmol/L _1 Calcium 8.9 - 10.3 mg/dL 8.6(L) 8.4(L) 8.7(L)    Hepatic Function Latest Ref Rng & Units 05/25/2016 04/07/2014 03/15/2014  Total Protein 6.5 - 8.1 g/dL 6.8 7.7 6.9  Albumin 3.5 - 5.0 g/dL 3.1(L) 4.1 3.7  AST 15 - 41 U/L 14(L) 20 15  ALT 17 - 63 U/L 13(L) 19 15  Alk Phosphatase 38 - 126 U/L 63 89 77  Total Bilirubin 0.3 - 1.2 mg/dL 0.9 0.5 0.5    CBC Latest Ref Rng & Units 06/02/2016 06/01/2016 05/31/2016  WBC 4.0 - 10.5 K/uL 14.6(H) 13.5(H) 15.1(H)  Hemoglobin 13.0 - 17.0 g/dL 13.3 11.8(L) 13.2  Hematocrit 39.0 - 52.0 % 40.1 36.3(L) 38.9(L)  Platelets 150 - 400 K/uL 531(H) 504(H) 452(H)    Lab Results  Component Value Date   MCV 91.8 06/02/2016   MCV 91.7 06/01/2016   MCV 90.7 05/31/2016   Lab Results  Component Value Date   TSH 1.850 04/07/2014    BNP    Component Value Date/Time   PROBNP 190.6 (H) 04/07/2014 1600    Lipid Panel     Component Value Date/Time   CHOL 96 01/31/2013 0940   TRIG 78 01/31/2013 0940   HDL 37 (L) 01/31/2013 0940   CHOLHDL 2.6 01/31/2013 0940   VLDL 16 01/31/2013 0940   LDLCALC 43 01/31/2013 0940     RADIOLOGY: No results found.  IMPRESSION:  1. Left carotid artery stenosis   2. Essential hypertension   3. CAD in native artery   4. Hx of CABG   5. Cerebrovascular disease   6. PVD (peripheral vascular disease) ([place])   7. Pulmonary emphysema, unspecified emphysema type ([place])   8. Hyperlipidemia with target LDL less than 70   9. History of esophageal cancer     ASSESSMENT AND PLAN: Mr. Fettig is a 71 year old gentleman with both CAD as well as PVD. He is s/p CABG revascularization surgery in 1998.  His  nuclear perfusion study in 2015 suggested  patency of his grafts and this was without scar  or ischemia. His CAD appears stable and he is not having anginal symptoms on current  therapy. His lcarotid ultrasound  on 04/07/2014 showed bilateral atherosclerotic plaque in the carotid bulb and internal carotid arteries with focal 60-69% stenosis in the left internal carotid.  Preocclusive narrowing was noted in the left external carotid.  His left vertebral artery was not visualized on the most recent study. He has occlusive disease in his right SFA but apparently continues to do well, status post femoropopliteal bypass surgery.  I reviewed with him in detail.  His most recent lower 70 Doppler study from 11/11/2016.  His right ABI was within normal range with abnormal waveforms in his left ABI remained noncompressible.  When I last saw him, I further titrated his losartan and his blood pressure today is mildly elevated on amlodipine 5 mg, losartan 100 mg, Toprol 50 mg.  Repeat by me BP was improved at 142/76.  He continues to take Crestor 10 mg for hyperlipidemia with target LDL less than 70.  He has cold stage II/3.  COPD/emphysema with severe bilateral emphysema on CT.  I have recommended that I see him in 6 months and prior to that office visit, he undergo a follow-up carotid duplex evaluation.  Since his last carotid study was done in 2015.  I will try to obtain  recent laboratory that he may have had with his primary physician , Gregory Eaton.  Time spent: 25 minutes Troy Sine, Eaton, [facility]  01/27/2017 9:56 PM

## 2017-01-25 NOTE — Patient Instructions (Addendum)
Your physician recommends that you schedule a follow-up appointment and carotid doppler in: 6 months  Your physician has requested that you have a carotid duplex. This test is an ultrasound of the carotid arteries in your neck. It looks at blood flow through these arteries that supply the brain with blood. Allow one hour for this exam. There are no restrictions or special instructions.

## 2017-01-28 DIAGNOSIS — K210 Gastro-esophageal reflux disease with esophagitis: Secondary | ICD-10-CM | POA: Diagnosis not present

## 2017-01-28 DIAGNOSIS — J449 Chronic obstructive pulmonary disease, unspecified: Secondary | ICD-10-CM | POA: Diagnosis not present

## 2017-01-28 DIAGNOSIS — I10 Essential (primary) hypertension: Secondary | ICD-10-CM | POA: Diagnosis not present

## 2017-01-28 DIAGNOSIS — E785 Hyperlipidemia, unspecified: Secondary | ICD-10-CM | POA: Diagnosis not present

## 2017-01-28 DIAGNOSIS — I2510 Atherosclerotic heart disease of native coronary artery without angina pectoris: Secondary | ICD-10-CM | POA: Diagnosis not present

## 2017-02-16 ENCOUNTER — Other Ambulatory Visit: Payer: Self-pay | Admitting: Pulmonary Disease

## 2017-02-17 ENCOUNTER — Other Ambulatory Visit: Payer: Self-pay | Admitting: Pulmonary Disease

## 2017-02-17 MED ORDER — BUDESONIDE-FORMOTEROL FUMARATE 160-4.5 MCG/ACT IN AERO: INHALATION_SPRAY | RESPIRATORY_TRACT | 3 refills | Status: DC

## 2017-03-01 ENCOUNTER — Ambulatory Visit (HOSPITAL_COMMUNITY)
Admission: RE | Admit: 2017-03-01 | Discharge: 2017-03-01 | Disposition: A | Discharge status: Home / Self Care | Payer: Medicare Other | Source: Ambulatory Visit | Attending: Cardiovascular Disease | Admitting: Cardiovascular Disease

## 2017-03-01 DIAGNOSIS — I679 Cerebrovascular disease, unspecified: Secondary | ICD-10-CM | POA: Diagnosis not present

## 2017-03-01 DIAGNOSIS — I10 Essential (primary) hypertension: Secondary | ICD-10-CM | POA: Insufficient documentation

## 2017-03-01 DIAGNOSIS — J449 Chronic obstructive pulmonary disease, unspecified: Secondary | ICD-10-CM | POA: Insufficient documentation

## 2017-03-01 DIAGNOSIS — Z951 Presence of aortocoronary bypass graft: Secondary | ICD-10-CM | POA: Insufficient documentation

## 2017-03-01 DIAGNOSIS — I6522 Occlusion and stenosis of left carotid artery: Secondary | ICD-10-CM | POA: Diagnosis present

## 2017-03-01 DIAGNOSIS — E785 Hyperlipidemia, unspecified: Secondary | ICD-10-CM | POA: Insufficient documentation

## 2017-03-01 DIAGNOSIS — Z87891 Personal history of nicotine dependence: Secondary | ICD-10-CM | POA: Diagnosis not present

## 2017-03-01 DIAGNOSIS — I6523 Occlusion and stenosis of bilateral carotid arteries: Secondary | ICD-10-CM | POA: Diagnosis not present

## 2017-03-01 DIAGNOSIS — I2510 Atherosclerotic heart disease of native coronary artery without angina pectoris: Secondary | ICD-10-CM | POA: Diagnosis not present

## 2017-03-01 DIAGNOSIS — E1151 Type 2 diabetes mellitus with diabetic peripheral angiopathy without gangrene: Secondary | ICD-10-CM | POA: Diagnosis not present

## 2017-05-09 ENCOUNTER — Ambulatory Visit (INDEPENDENT_AMBULATORY_CARE_PROVIDER_SITE_OTHER)
Admission: RE | Admit: 2017-05-09 | Discharge: 2017-05-09 | Disposition: A | Discharge status: Home / Self Care | Payer: Medicare Other | Source: Ambulatory Visit | Attending: Pulmonary Disease | Admitting: Pulmonary Disease

## 2017-05-09 ENCOUNTER — Ambulatory Visit (INDEPENDENT_AMBULATORY_CARE_PROVIDER_SITE_OTHER): Payer: Medicare Other | Admitting: Pulmonary Disease

## 2017-05-09 ENCOUNTER — Ambulatory Visit (INDEPENDENT_AMBULATORY_CARE_PROVIDER_SITE_OTHER): Payer: Medicare Other

## 2017-05-09 VITALS — BP 140/72 | HR 70 | Temp 97.6°F | Ht 68.0 in | Wt 163.4 lb

## 2017-05-09 DIAGNOSIS — Z951 Presence of aortocoronary bypass graft: Secondary | ICD-10-CM

## 2017-05-09 DIAGNOSIS — I2510 Atherosclerotic heart disease of native coronary artery without angina pectoris: Secondary | ICD-10-CM

## 2017-05-09 DIAGNOSIS — R911 Solitary pulmonary nodule: Secondary | ICD-10-CM

## 2017-05-09 DIAGNOSIS — J439 Emphysema, unspecified: Secondary | ICD-10-CM

## 2017-05-09 DIAGNOSIS — Z23 Encounter for immunization: Secondary | ICD-10-CM | POA: Diagnosis not present

## 2017-05-09 DIAGNOSIS — I10 Essential (primary) hypertension: Secondary | ICD-10-CM

## 2017-05-09 DIAGNOSIS — K219 Gastro-esophageal reflux disease without esophagitis: Secondary | ICD-10-CM

## 2017-05-09 DIAGNOSIS — C159 Malignant neoplasm of esophagus, unspecified: Secondary | ICD-10-CM

## 2017-05-09 DIAGNOSIS — I739 Peripheral vascular disease, unspecified: Secondary | ICD-10-CM

## 2017-05-09 DIAGNOSIS — I679 Cerebrovascular disease, unspecified: Secondary | ICD-10-CM

## 2017-05-09 MED ORDER — BUDESONIDE-FORMOTEROL FUMARATE 160-4.5 MCG/ACT IN AERO: 2.0000 | INHALATION_SPRAY | Freq: Two times a day (BID) | RESPIRATORY_TRACT | 0 refills | Status: DC

## 2017-05-09 MED ORDER — AZITHROMYCIN 250 MG PO TABS: ORAL_TABLET | ORAL | 0 refills | Status: DC

## 2017-05-09 NOTE — Patient Instructions (Signed)
Today we updated your med list in our EPIC system...    Continue your current medications the same...  We wrote for a ZPak to keep on hand as needed for infection...  Today we checked a follow up CXR...    We will contact you w/ the results when available...   We gave you the 2018 FLU vaccine today...  Call for any questions...  Let's plan a follow up visit in 4mo, sooner if needed for problems.Marland KitchenMarland Kitchen

## 2017-05-10 ENCOUNTER — Encounter: Payer: Self-pay | Admitting: Pulmonary Disease

## 2017-05-10 NOTE — Progress Notes (Signed)
Subjective:     Patient ID: Gregory Eaton, male   DOB: 1946-04-15, 71 y.o.   MRN: [id number]  HPI 71 y/o WM ex-smoker w/ COPD/ emphysema (GOLD Stage2-3), 7-68m RLL nodule on CT Chest, CAD- s/p CABG 1998 w/ known carotid & periph vasc dis (s/p Fem-Pop bypass), hx esoph cancer- s/[facility]..   ~  January 30, 2015:  Initial pulmonary consult w/ Gregory Eaton>   89y/o WM, [place] due to COPD/ emphysema/ & 7-840mRLL nodule on CT Chest>      Gregory Eaton in April2016 & went to his PCP Gregory Eaton, treated w/ antibiotic & Pred=> improved but didn't get over it so he was given a second antibiotic + Spiriva, Proair, Singulair => finally improved so he tells me he stopped the Spiriva "I'm better"...  He is an ex-smoker, starting in his teens, smoked for 40 yrs up to 2ppd at max for a 55-60 pack-year smoking history... 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     He has a hx HBP, HL, CAD, s/p CABG in 1998, known carotid art dis & ASPVD w/ prev Fem-Pop bypass> followed by Gregory Eaton & his 10/07/14 Epic note is reviewed; meds adjusted, see below...    H[facility] esophageal [facility] w/ [facility]... EXAM revealed Afeb, VSS, O2sat=95% on RA;  HEENT- neg, mallampati2;  Chest- decr BS bilat w/o w/r/r;  Heart- RR gr1/6 SEM w/o r/g;  Abd- soft, neg;  Ext- w/o c/c/e...  CT Angio Chest 04/07/14 showed neg for PE, s/p CABG, severe atherosclerosis in Ao, severe bilat emphysema, 8mm39mdule RLL, inflamm changes in lingula, post surg changes related to esophagectomy & gastric pull thru, no signif LNs...  EKG 09/2014 showed NSR, rate64, RBBB, NAD...  [facility]  COPD/emphysema, unchanged 7mm 36m nodule, no new lesions, post-op changes after esophagectomy & gastric pull thru...  CXR 12/11/14 showed norm heart size, s/p CABG, COPD/ emphysema, NAD...   Spirometry 01/30/15 showed FVC=3.45 (82%), FEV1=1.76 (54%), %1sec=51, mid-flows reduced at 23% predicted;  Mod severe airflow obstruction w/ GOLD Stage 2-3 COPD....   Ambulatory oxygen saturation test 01/30/15> O2sat=96% on RA at rest w/ pulse=67/min;  He ambulated 3 laps in the office w/ nadir O2sat=93% w/ pulse=85/min...  LABS 7/16:  Alpha-1-Antitrypsin level ==> 133 (83-199 mg/dL) and phenotype= M1M2  IMP/PLAN>>  Gregory Eaton severe COPD/ emphysema based on his PFTs and heavy smoking hx; he would benefit from ICS/LABA and LAMA Rx=> Eaton to use SYMBICORT160-2spBid & SPIRIVA daily; he will continue PROAIR-HFA for acute symptoms prn, SINGULAIR10, and MUCINEX 600mgQ25mor thick phlegm/ mucus; he needs to maintain a vigorous antireflux regimen w/ his esoph dis & surg; as much as poss he will avoid infections and needs to start a vigorous exercise program (eg- silver sneakers or similar)... Prognosis is guarded w/ his signif co-morbidities (ASHD, ASPVD, Hx esoph ca w/ surg 2010, etc);  He will ret in 50mo w/11molPFTs to check DLCO...  ~  April 03, 2015:  50mo ROV24moSN>         Gregory Eaton for f/u on his Advair115-2spBid,  Spiriva daily, Singulair10, and ProairHFA prn;  He has had some trouble w/ cost of the meds and asked to get a copy of his insurance company prescription drug formulary for [place] to review;  He notes that his breathing is stable overall- still mows yard & does ok, not much cough/ sput/ no hemoptysis/ no CP/ no edema;  He has some reflux issues on Protonix40 & reminded of the vigorous antireflux regimen he needs to follow including NPO after dinner, elev HOB 6", take Pepcid ~62mn before bedtime, etc... We reviewed the following medical problems during today's office visit >>     COPD/ emphysema ==>  GOLD Stage 2-3 disease>  Continue Advair115-2spBid, Spiriva daily, Singulair10, ProairHFA prn; get [place] copy of his prescription drug formulary...    7-812mRLL pulmonary nodule on CT Chest>  No change noted from 2015=>2016 ([facility]- [facility] felt [facility]) & we discussed repeat scan in spring of 2017...    HBP>  [place] Toprol[place], Amlod5, Cozaar50; BP=130/74 & he denies CP, palpit, SOB, edema...    CAD- s/p 5 vessel CABG, RBBB>  On ASA/ Plavix; followed by Gregory Eaton...    Carotid stenosis>  Mild bilat carotid stenoses on CDopplers    ASPVD>  s/p right Fem-Pop    Hx esophageal [facility]- [facility]>  His GI is Gregory Eaton, [facility] and [facility] but no CareEverywhere records are avail...    Other medical issues as noted>  See below... EXAM revealed Afeb, VSS, O2sat=97% on RA;  HEENT- neg, mallampati2;  Chest- decr BS bilat w/o w/r/r;  Heart- RR gr1/6 SEM w/o r/g;  Abd- soft, neg;  Ext- w/o c/c/e...  FullPFT 04/03/15 showed FVC=4.22 (100%), FEV1=2.16 (70%), %1sec=51, mid-flows reduced at 36% predicted; post-bronchodil there was a 3% incr in FEV1; TLC=6.80 (100%), RV=1.79 (75%), RV/TLC=26; DLCO=55% predicted... These PFTs indicate moderately severe airflow obstruction, GOLD Stage2-3 COPD, decr diffusion c/w emphysema...  IMP/PLAN>>  As prev noted- Gregory Eaton to stay on the Advair,Spiriva, Proair; avoid infections, get all approp vaccinations from his PCP; follow the vigorous antireflux regimen as outlined, and plan f/u w/ us[place] about 10m7moooner if needed...   ~  September 18, 2015:  31mo631mo w/ Gregory Eaton>  Gregory Eaton a COPD exac 06/2015 & saw Gregory Eaton- he had stopped his Symbicort & Spiriva, presented w/ some congestion & wheezing, & given ZPak, Pred, Symbicort, Mucinex, Zantac;  He did well after this therapy until recent similar symptoms- cough, chest congestion, yellow sput, but he denies f/c/s, incr SOB,  chest tightness/ wheezing/ etc;  He is again NOT using his Symbicort regularly as it costs him $150/mo; he has been active- using his exerc bike, caring for wife s/p TKR, yard work & push mows...     COPD/ emphysema ==> GOLD Stage 2-3 disease>  He again stopped his Symbicort & he is off the Spiriva; on Singulair10, ProairHFA prn; needs to get us a[place]opy of his prescription drug formulary (reminded again)...    7-8mm 610m pulmonary nodule on CT Chest>  No change noted from 2015=>2016 ([facility]- they felt it was [facility]) & we discussed repeat CT Chest but he wants to hold-off...    He had [place] appt 05/2015 w/ DrKelly> HBP, CAD, s/pCABG 1998, ASPVD- s/p right Fem-Pop/ occluded left vertebral/ mild carotid dis, HL[facility]  no changes made. EXAM revealed Afeb, VSS, O2sat=98% on RA;  HEENT- neg, mallampati2;  Chest- decr BS bilat w/o w/r/r;  Heart- RR gr1/6 SEM w/o r/g;  Abd- soft, neg;  Ext- w/o c/c/e;  Neuro- intact... IMP/PLAN>>  I again explained to Gregory Eaton the need for regular ICS/LABA medication- he needs to contact his insurance regarding a their prescription drug formulary, in the meanwhile we will refill the Symbicort160- 2spBid;  Rx written for ZPak w/ refills per his request...  ~  March 23, 2016:  17moROV w/ Gregory Eaton>  CHimmatreports that he is doing well- no new complaints or concerns;  He has min cough Qam, sm amt clear sput (no color or blood), "it may be reflux" he says; denies SOB/ CP/ palpit/ etc; he does have chr stable DOE (no change- eg. climbing hills);  On SYMBICORT160-2spBid (taking it "most of the time", notes $45 copay now), SINGULAIR10, Mucinex- just taking this prn...  NOTE: his FEV1 was 2.16L Sep2016 & has dropped to 1.76L today => he is asked to add the [facility] med regimen (alternatives- Incruse, TCaprice Renshaw he will check formulary),,,     See prob list above-- HBP, CAD- s/p 5vessel CABG, RBBB, ASPVD- s/p R fem-pop, bilat carotid stenoses>  on ASA/PLAVIX, MetopXL50, Amlod5, Losar75;  BP stable, denies CP, palpit, ch in DOE, edema, etc...     He has hx esoph Ca, s/p esophagectomy & felt to be cured; [facility] ([facility]) seen 03/03/16- plans f/u EGD/ Colon soon...  EXAM revealed Afeb, VSS, O2sat=98% on RA;  HEENT- neg, mallampati2;  Chest- decr BS bilat w/o w/r/r;  Heart- RR gr1/6 SEM w/o r/g;  Abd- soft, neg;  Ext- w/o c/c/e;  Neuro- intact...  CXR 12/02/15 by Gregory Eaton>  Norm heart size s/p CABG, mild hyperinflation, coarse interstitial markings, small HH w/ A/F level...   [facility]  FLP- all parameters at goals;  Chems- ok w/ Cr=0.74, BS=88;  CBC- wnl...   Spirometry 03/23/16>  FVC=4.10 (101%), FEV1=1.76 (59%), %1sec=43, mid-flows reduced at 20% predicted... This is c/w a severe obstructive ventilatory defect & GOLD Stage 2 CPD... IMP/PLAN>>  His PCP is Gregory Eaton (seen 2-3x/yr, Eaton-DrKelly, GI-Gregory Eaton;  We reviewed his deterioration in FEV1 over the last yr & Eaton to take the Symbicort- 2sp Bid regularly & add-in SPIRIVA once daily every day... we plan routine ROV in 646mo.  ~  May 12, 2016:  7wk ROV & [place]  [place] when he became ill w/ chest congestion, SOB, fever to 101, chills, & weak w/ decr in BP; [facility] [place];  He had left lung pneumonia on CXR, WBC=14.8, Sput grew NTF only, BNP=119;  He was treated w/ Levaquin and slowly improved; [place] to [place] 3 [place] to recoup & [place] much better...     COPD/ emphysema ==> GOLD Stage 2-3 disease>  He again stopped his Symbicort & he is off the Spiriva; on Singulair10, ProairHFA prn; needs to get [place] copy of his prescription drug formulary (reminded again)...    7-48m57mLL pulmonary nodule on CT Chest>  No change  noted from 2015=>2016 ([facility]- [facility] felt it was [facility]) & we discussed repeat CT Chest but he wants to hold-off...Marland KitchenMarland Kitchen  CAP, Adm for 3d in [facility] 03/2016>  No organism isolated, treated w/ Levaquin and slowly resolved back to baseline...    CARDIAC issues>  HBP, CAD- s/p 5 vessel bypass 1998, RBBB, ASPVD- s/p right Fem-Pop/ occluded left vertebral/ mild carotid dis>  On ASA/ Plavix, + ToprolXL50, Amlod5, Cozaar50 & [facility]...     Hx esophageal cancer- s/p esophagectomy w/ gastric pull through at [facility] in 2010>  His GI is Gregory Eaton, [facility] and his [facility] in [facility] but no CareEverywhere records are avail...    Other medical issues as noted>  HBP, HL, IFG, GERD, Barrett's, Esoph Ca, colon polyps, divertics, anxiety on Xanax...  EXAM revealed Afeb, VSS, O2sat=95% on RA;  HEENT- neg, mallampati2;  Chest- decr BS bilat w/o w/r/r;  Heart- RR gr1/6 SEM w/o r/g;  Abd- soft, neg;  Ext- w/o c/c/e;  Neuro- intact...  CXR 05/12/16>  Norm heart size, s/p CABG, hyperinflation w/ COPD, linear scarring in LUL & lingula, NAD...   IMP/PLAN>>  Gregory Eaton has made a nice recovery from his recent CAP 9/17 in [place]; he is off antibiotics and Eaton to continue his Symbicort160-2spBid, Spiriva daily, Singulair10, Mucinex 1253m Bid fluids, etc...  ~  July 27, 2016:  2-3739moOV & ChLaceyas re-hospitalized 11/7 - 06/03/16 by [facility] w/ aspiration pneumonia & severe on-going reflux s/p esophageal [facility];  He was treated w/ IV Zosyn=> Augmentin, eval by speech Path, sent home w/ new home O2;  Since disch he has been seen by Eaton-DrKelly> HBP, CAD, s/pCABG1998, HL, [facility]/ occluded left vertebral art, mild carotid dis, s/p fem-pop bypass & occlusive right SFA dis;  Stable on ASA/Plavix, ToprolXL, Amlod, Losar, Crestor;  He was also eval post hosp by TP> improved but seen again 07/13/16 w/ incr cough,  thick mucus, & CXR showed improved aeration/ resolving pneumonia (Eaton same inhalers + incr ?Mucinex, fluids, & Pred taper)...     SEE PROB LIST ABOVE => reviewed in detail w/ pt...    ChReseans back to baseline- sl cough, small amt clear sput, no hemoptysis, SOB/DOE is stable (eg-he bikes 3031md);  Problems as listed above- on Symbicort 160-2spBid, Spiriva daily, Singulair10, and ALBUT NEBS prn EXAM revealed Afeb, VSS, O2sat=98% on RA;  HEENT- neg, mallampati2;  Chest- decr BS bilat w/o w/r/r;  Heart- RR gr1/6 SEM w/o r/g;  Abd- soft, neg;  Ext- w/o c/c/e;  Neuro- intact...  CXR 07/13/16>  S/p CABG & atherosclerotic changes in Ao, background COPD/E w/ hyperinflation & scarring, marked improvement in RUL/RLL areas...  LABS 05/2016>  Chems- ok w/ BS~110-160;  CBC- ok x WBC~10-15K;   IMP/PLAN>>  He is reminded to use his inhalers regularly & encouraged to use the NEBULIZER as needed;  Needs to incr the Mucinex600m59m 2Bidf w/ fluids;  O2sats are all in the 90s on RA- [facility];  Eaton to continue his exercise program 7 call for any set backs in his status... we plan ROV in 39mo.68mo October 27, 2016:  39mo R50mo Gregory Eaton reports a good interval- breathing is stable, no new complaints or concerns;  He notes min cough, scant intermit sput (clear), no hemoptysis, chr stable SOB/DOE w/ activities like rushing about, ok w/ ADLs, he's been painting a room, somewhat lim by LBP;  He denies any swallowing difficulty-  eating well, maintaining wt, no reflux/ regurg/ dysphagia/ n/v etc...  We reviewed the following medical problems during today's office visit>     COPD/ emphysema ==> GOLD Stage 2-3 disease w/ severe bilat emphysema on CT & normal A1AT level & phenotypeM1M2>  On Symbicort160- 2spBid, Spiriva daily, Singulair10, NEBS w/ Albut Qid prn & he hasn't needed this in months...    7-65m RLL pulmonary nodule on CT Chest>  No change noted from 2015=>2016 ([facility]- [facility] felt [facility] was [facility]) &  serial CXRs w/o nodules seen (Last CXR 12/17 showed s/p CABG & atherosclerotic changes in Ao, background COPD/E w/ hyperinflation & scarring...    HBP>  [place] Toprol[place], Amlod5, Cozaar100; BP=140/72 & he denies CP, palpit, SOB, edema...    CAD- s/p 5 vessel CABG, RBBB>  On ASA/ Plavix; [facility]-     Carotid stenosis & occluded left vertebral>  Last CDopplers [place] bil[place] plaque in bulbs and 0-39% R-ICA stenosis and 50-69% L-ICA stenosis; he has a bruit on the right side on exam...    ASPVD>  s/p right Fem-Pop in 1993;  Last LE arterial dopplers 06/2015 showed stable patent left Fem-Pop bypass graft with irregularities & three vessel run-off on the left; ABIs= 0.99-right & 1.1-left...    [facility]- [facility]>  His GI is Gregory Eaton, [facility] and [facility] but no CareEverywhere records are avail...    Other medical issues as noted>  See below... EXAM revealed Afeb, VSS, O2sat=96% on RA;  HEENT- neg, mallampati2;  Chest- decr BS bilat w/o w/r/r;  Heart- RR gr1/6 SEM w/o r/g;  Abd- soft, neg;  Ext- w/o c/c/e;  Neuro- intact... IMP/PLAN>>  CDemarkois stable w/ his severe cardiovasc & pulmonary issues as above;  He will continue the same meds for now &is encouraged to exercise as best he can;  He is up-to-date on vaccinations and he will call prn any problems w/ rov planned in 69mo.   ~  May 10, 2017:  6 month ROQuintoneports a good interval, breathing well, rarely notes any wheezing, and min cough, no signif sput, SOB/DOE w/o change (ADLs ok, uses ex-bike, more lim by hip pain); denies f/c/s, no CP/ palpit/ edema; PCP= DrJohn Z Wolpert & seen about 3-25m90moo he says;  On Advair250Bid, Spiriva daily, Singulair10, Albut NEB + HFA as needed... We reviewed the following medical problems during today's office visit>       COPD/ emphysema ==> GOLD Stage 2-3 disease w/ severe bilat emphysema on CT & normal A1AT level & phenotypeM1M2>  On Symbicort160- 2spBid, Spiriva daily, Singulair10, NEBS w/ Albut Qid prn & he hasn't needed this in months...    7-8mm53mL pulmonary nodule on CT Chest>  No change noted from 2015=>2016 ([facility]- they felt [facility] was benign) & serial CXRs w/o nodules seen (Last CXR 12/17 showed s/p CABG & atherosclerotic changes in Ao, background COPD/E w/ hyperinflation & scarring...    HBP>  [place], Amlod5, Cozaar100; BP=140/72 & he denies CP, palpit, SOB, edema...    CAD- s/p 5 vessel CABG, RBBB>  On ASA/ Plavix; [facility] & last seen 06/2016-     Carotid stenosis & occluded left vertebral>  Last CDopplers [place] in bulbs and 0-39%  R-ICA stenosis and 50-69% L-ICA stenosis; he has a bruit on the right side on exam...    ASPVD>  s/p right Fem-Pop in 1993;  Last LE arterial dopplers 06/2015 showed stable patent left Fem-Pop bypass graft with irregularities & three vessel run-off on the left; ABIs= 0.99-right & 1.1-left...    Hx esophageal cancer- s/p esophagectomy w/ gastric pull through at [facility] in 2010>  His GI is Gregory Eaton, [facility] and his [facility] was [facility] in [facility] but no CareEverywhere records are avail...    Other medical issues as noted>  See below... EXAM revealed Afeb, VSS, O2sat=96% on RA;  HEENT- neg, mallampati2;  Chest- decr BS bilat w/o w/r/r;  Heart- RR gr1/6 SEM w/o r/g;  Abd- soft, neg;  Ext- w/o c/c/e;  Neuro- intact...  CXR 05/09/17 (independently reviewed by me in the PACS system) showed norm heart size, s/p CABG, underlying COPD, chr changes w/ scarring at left base- NAD...  IMP/PLAN>>  We reviewed Gregory Eaton' medical issues- Eaton to continue same rx + exercise; given 2018 Flu vaccine and we discussed ZPak to keep on hand for prn use; we plan rov recheck in 70mo sooner if needed for acute  problems...     Past Medical History  Diagnosis Date  . Esophageal carcinoma 03/2009    T1N1M0  . Hypertension >> on Metoprolol, Amlodipine, Losartan   . Barrett's esophagus   . GERD (gastroesophageal reflux disease) >> on Protonix40 & Carafate 1gmQid   . CAD (coronary artery disease)   . PVD (peripheral vascular disease)   . Hyperlipidemia >> on Crestor10, Fish Oil + diet   . Adenomatous colon polyp 03/2009    Last colonoscopy by Dr. RGala Romney  . Diverticulosis   . Tobacco abuse   . Tachyarrhythmia 1999    [facility] . COPD (chronic obstructive pulmonary disease)     Severe emphysema per CT  . History of echocardiogram 08/27/2009    EF >55%  . History of nuclear stress test 11/24/2011    lexiscan; normal perfusion; low risk scan; non-diagnostic for ischemia  . History of Doppler ultrasound 11/09/2011    03/2014- 50-69% L ICA stenosis;carotid doppler; L bulb/prox ICA 0-49% diameter reduction; L vertebral artery - occlusive ds; L ECA  demonstrates severe amount of fibrous plaque  . History of Doppler ultrasound 11/09/11    LEAs; R ABI - mod art. insuff.; L ABI normal at rest; R SFA - occlusive ds; L SFA - occlusive ds; patent fem-pop graft  . Adenomatous polyp of colon 11/03/2010  . Left carotid artery stenosis 04/08/2014  . Pulmonary nodule, right 04/08/2014    8 mm-incidental finding on CT  . DM type 2 (diabetes mellitus, type 2) >> on diet alone 04/09/2014    A1c 6.5 (04/08/14)     Past Surgical History:  Procedure Laterality Date  . COLONOSCOPY  03/17/2009   Dr.Rourk- normal rectum, sigmoid diverticula, some pale sigmoid mucosa with diffuse petechiae. pedunculated polyp at the splenic flexure, remainder of colonic mucosa appeared normal. bx= adenomatous polyp  . COLONOSCOPY  04/19/2003   Dr.Rehman- few diverticu;a at the sigmoid colon, 3 small polyps, one at the transverse colon and 2 at the sigmoid, small external hemorrhoids. bx report not available.   . CORONARY  ARTERY BYPASS GRAFT  1998   [name]  . ES[facility]AGECT[facility]Y  [facility]   [facility] . ESOPHAGOGASTRODUODENOSCOPY  11/25/2010   Dr.Rourk- s/p esophagectomy with gastric pull-up, esophageal erosions straddling  the surgical anastomosis, salmon colored epithelium coming up a good centimeter to a centimeter and a half above the suture line, islands of salmon colored epithelium in the most poximal residual esophagus, remainder of gastric mucosa appeared normal. bx= swamous &gastric glandular mucosa w/chronic active inflammation  . ESOPHAGOGASTRODUODENOSCOPY  03/17/2009   Dr.Rourk- 4cm segment of salmon-colored epithlium distal esophagus suspicious for barretts esophagus. area of suspicious nodularity w/in this segment bx seperately. small to moderate size hiatal hernia, o/w normal stomach D1 and D2 bx=adenocarcinoma  . FEMORAL-POPLITEAL BYPASS GRAFT  1993   occlusive ds in R SFA  . GASTRIC PULL THROUGH  2010   With esophagectomy  . LIPOMA EXCISION  2010    Outpatient Encounter Prescriptions as of 05/09/2017  Medication Sig  . albuterol (PROVENTIL) (2.5 MG/3ML) 0.083% nebulizer solution Take 3 mLs (2.5 mg total) by nebulization every 6 (six) hours as needed for wheezing or shortness of breath. Patient will also need nebulizer machine please  . ALPRAZolam (XANAX) 0.5 MG tablet Take 1 tablet (0.5 mg total) by mouth at bedtime.  Marland Kitchen amLODipine (NORVASC) 5 MG tablet TAKE ONE (1) TABLET BY MOUTH EVERY DAY  . aspirin 81 MG EC tablet Take 81 mg by mouth daily.    . budesonide-formoterol (SYMBICORT) 160-4.5 MCG/ACT inhaler INHALE TWO PUFFS INTO THE LUNGS TWO TIMES DAILY  . clopidogrel (PLAVIX) 75 MG tablet Take 1 tablet (75 mg total) by mouth daily.  Marland Kitchen latanoprost (XALATAN) 0.005 % ophthalmic solution Place 1 drop into both eyes at bedtime.   Marland Kitchen losartan (COZAAR) 100 MG tablet TAKE ONE (1) TABLET EACH DAY  . metoprolol succinate (TOPROL-XL) 50 MG 24 hr tablet TAKE ONE TABLET ONCE DAILY  . montelukast  (SINGULAIR) 10 MG tablet Take 1 tablet by mouth daily.  . Multiple Vitamins-Minerals (CENTRUM SILVER PO) Take 1 tablet by mouth daily.    . nitroGLYCERIN (NITROSTAT) 0.4 MG SL tablet Place 1 tablet (0.4 mg total) under the tongue every 5 (five) minutes as needed for chest pain.  . pantoprazole (PROTONIX) 40 MG tablet TAKE ONE (1) TABLET EACH DAY  . ranitidine (ZANTAC) 150 MG tablet Take 150 mg by mouth at bedtime.  . rosuvastatin (CRESTOR) 10 MG tablet Take 1 tablet (10 mg total) by mouth daily.  . sucralfate (CARAFATE) 1 G tablet Take 1 tablet (1 g total) by mouth 4 (four) times daily -  with meals and at bedtime.  Marland Kitchen tiotropium (SPIRIVA) 18 MCG inhalation capsule Place 1 capsule (18 mcg total) into inhaler and inhale daily.  Marland Kitchen azithromycin (ZITHROMAX) 250 MG tablet Take 2 today then 1 daily until gone  . budesonide-formoterol (SYMBICORT) 160-4.5 MCG/ACT inhaler Inhale 2 puffs into the lungs 2 (two) times daily.  . timolol (TIMOPTIC) 0.5 % ophthalmic solution Place 1 drop into both eyes daily.   . Tiotropium Bromide Monohydrate (SPIRIVA RESPIMAT) 1.25 MCG/ACT AERS Inhale 2 puffs into the lungs daily.   No facility-administered encounter medications on file as of 05/09/2017.     Allergies  Allergen Reactions  . Altace [Ramipril] Cough  . Prednisone Other (See Comments)    "throat broke out"     Immunization History  Administered Date(s) Administered  . Influenza, High Dose Seasonal PF 05/09/2017  . Influenza,inj,Quad PF,6+ Mos 04/19/2015, 04/17/2016  . Influenza-Unspecified 05/01/2014  . Pneumococcal Conjugate-13 04/01/2014  . Pneumococcal Polysaccharide-23 04/17/2016    Current Medications, Allergies, Past Medical History, Past Surgical History, Family History, and Social History were reviewed in [employer] record.  Review of Systems            All symptoms NEG except where BOLDED >>  Constitutional:  F/C/S, fatigue, anorexia, unexpected weight  change. HEENT:  HA, visual changes, hearing loss, earache, nasal symptoms, sore throat, mouth sores, hoarseness. Resp:  cough, sputum, hemoptysis; SOB, tightness, wheezing. Cardio:  CP, palpit, DOE, orthopnea, edema. GI:  N/V/D/C, blood in stool; reflux, abd pain, distention, gas. GU:  dysuria, freq, urgency, hematuria, flank pain, voiding difficulty. MS:  joint pain, swelling, tenderness, decr ROM; neck pain, back pain, etc. Neuro:  HA, tremors, seizures, dizziness, syncope, weakness, numbness, gait abn. Skin:  suspicious lesions or skin rash. Heme:  adenopathy, bruising, bleeding. Psyche:  confusion, agitation, sleep disturbance, hallucinations, anxiety, depression suicidal.   Objective:   Physical Exam      Vital Signs:  Reviewed...   General:  WD, WN, 71 y/o WM in NAD; alert & oriented; pleasant & cooperative... HEENT:  [place]/AT; Conjunctiva- pink, Sclera- nonicteric, EOM-wnl, PERRLA, EACs-clear, TMs-wnl; NOSE-clear; THROAT-clear & wnl.  Neck:  Supple w/ fair ROM; no JVD; normal carotid impulses w/o bruits; no thyromegaly or nodules palpated; no lymphadenopathy.  Chest:  decr BS bilat, without wheezes, rales, or rhonchi heard. Heart:  Regular Rhythm; gr1/6 SEM w/o rubs or gallops detected. Abdomen:  Soft & nontender- no guarding or rebound; normal bowel sounds; no organomegaly or masses palpated. Ext:  decrROM; without deformities +arthritic changes; no varicose veins, +venous insuffic, or edema;  Pulses intact w/o bruits. Neuro:  No focal neuro deficits; sensory testing normal; gait normal & balance OK. Derm:  No lesions noted; no rash etc. Lymph:  No cervical, supraclavicular, axillary, or inguinal adenopathy palpated.   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   PLAN >>  01/30/15>  CEtiennehas mod severe COPD/ emphysema based on his PFTs and heaqvy smoking hx; he would benefit from ICS/LABA and LAMA Rx=> Eaton to use SYMBICORT160-2spBid & SPIRIVA daily; he will continue PROAIR-HFA for acute symptoms prn, SINGULAIR10, and MUCINEX 60476mid for thick phlegm/ mucus;; he needs to maintain a vigorous antireflux regimen w/ his esoph dis & surg; as much as poss he will avoid infections and needs to start a vigorous exercise program (eg- silver sneakers or similar)... Prognosis is guarded w/ his signif co-morbidities (ASHD, ASPVD, Hx esoph ca w/ surg 2010, etc);  He will ret in 76m890mo FullPFTs to check DLCO. 04/03/15>  As prev noted- ChaRayansh Eaton to stay on the Advair,Spiriva, Proair; avoid infections, get all approp vaccinations from his PCP; follow the vigorous antireflux regimen as outlined, and plan f/u w/ us [place] about 90mo66mooner if needed. 09/18/15>   I again explained to CharDaire need for regular ICS/LABA medication- he needs to contact his insurance regarding a their prescription drug formulary, in the meanwhile we will refill the Symbicort160- 2spBid;  Rx written for ZPak w/ refills per his request. 05/12/16>   [place] [facility],  treated w/ Levaquin & made a slow but steady recovery; again requested to continue regular Rx w/ his Symbicort160-2spBid, Spiriva daily, Singulair10, Mucinex 1250m Bid fluids, etc 11/7 - 06/03/16>  [facility] w/ asp pneumonia & severe on-going reflux [facility]; he responded to antibiotics w/ Zosyn=> Augmentin, and CXR resolved over the next month... 07/27/16>  He is back to baseline, & reminded to use his inhalers regularly & encouraged to use the NEBULIZER as needed;  Needs to incr the Mucinex6052mto  2Bidf w/ fluids;  O2sats are all in the 90s on RA- [facility];  Eaton to continue his exercise program & call for any set backs in his status. 10/27/16>   Gregory Eaton is stable w/ his severe cardiovasc & pulmonary issues as above;  He will continue the same meds for now &is encouraged to exercise as best he can;  He is up-to-date on vaccinations and he will call prn any problems w/ rov planned in 74m74mo0/23/18>   We reviewed ChaRomenedical issues- Eaton to continue same rx + exercise; given 2018 Flu vaccine and we discussed ZPak to keep on hand for prn use; we plan rov recheck in 74mo53mooner if needed for acute problems.   Plan:       Patient's Medications  New Prescriptions   AZITHROMYCIN (ZITHROMAX) 250 MG TABLET    Take 2 today then 1 daily until gone   BUDESONIDE-FORMOTEROL (SYMBICORT) 160-4.5 MCG/ACT INHALER    Inhale 2 puffs into the lungs 2 (two) times daily.  Previous Medications   ALBUTEROL (PROVENTIL) (2.5 MG/3ML) 0.083% NEBULIZER SOLUTION    Take 3 mLs (2.5 mg total) by nebulization every 6 (six) hours as needed for wheezing or shortness of breath. Patient will also need nebulizer machine please   ALPRAZOLAM (XANAX) 0.5 MG TABLET    Take 1 tablet (0.5 mg total) by mouth at bedtime.   AMLODIPINE (NORVASC) 5 MG TABLET    TAKE ONE (1) TABLET BY MOUTH EVERY DAY   ASPIRIN 81 MG EC TABLET    Take 81 mg by mouth daily.     BUDESONIDE-FORMOTEROL (SYMBICORT) 160-4.5 MCG/ACT INHALER    INHALE TWO PUFFS INTO THE LUNGS TWO TIMES DAILY   CLOPIDOGREL (PLAVIX) 75 MG TABLET    Take 1 tablet (75 mg total) by mouth daily.   LATANOPROST (XALATAN) 0.005 % OPHTHALMIC SOLUTION    Place 1 drop into both eyes at bedtime.    LOSARTAN (COZAAR) 100 MG TABLET    TAKE ONE (1) TABLET EACH DAY   METOPROLOL SUCCINATE (TOPROL-XL) 50 MG 24 HR TABLET    TAKE ONE TABLET ONCE DAILY   MONTELUKAST (SINGULAIR) 10 MG TABLET    Take 1 tablet by mouth daily.   MULTIPLE VITAMINS-MINERALS (CENTRUM SILVER  PO)    Take 1 tablet by mouth daily.     NITROGLYCERIN (NITROSTAT) 0.4 MG SL TABLET    Place 1 tablet (0.4 mg total) under the tongue every 5 (five) minutes as needed for chest pain.   PANTOPRAZOLE (PROTONIX) 40 MG TABLET    TAKE ONE (1) TABLET EACH DAY   RANITIDINE (ZANTAC) 150 MG TABLET    Take 150 mg by mouth at bedtime.   ROSUVASTATIN (CRESTOR) 10 MG TABLET    Take 1 tablet (10 mg total) by mouth daily.   SUCRALFATE (CARAFATE) 1 G TABLET    Take 1 tablet (1  g total) by mouth 4 (four) times daily -  with meals and at bedtime.   TIMOLOL (TIMOPTIC) 0.5 % OPHTHALMIC SOLUTION    Place 1 drop into both eyes daily.    TIOTROPIUM (SPIRIVA) 18 MCG INHALATION CAPSULE    Place 1 capsule (18 mcg total) into inhaler and inhale daily.   TIOTROPIUM BROMIDE MONOHYDRATE (SPIRIVA RESPIMAT) 1.25 MCG/ACT AERS    Inhale 2 puffs into the lungs daily.  Modified Medications   No medications on file  Discontinued Medications   No medications on file

## 2017-05-11 ENCOUNTER — Other Ambulatory Visit: Payer: Self-pay | Admitting: *Deleted

## 2017-05-11 DIAGNOSIS — I739 Peripheral vascular disease, unspecified: Secondary | ICD-10-CM

## 2017-05-18 DIAGNOSIS — M25552 Pain in left hip: Secondary | ICD-10-CM | POA: Diagnosis not present

## 2017-05-18 DIAGNOSIS — M545 Low back pain: Secondary | ICD-10-CM | POA: Diagnosis not present

## 2017-05-23 ENCOUNTER — Other Ambulatory Visit: Payer: Self-pay | Admitting: Cardiovascular Disease

## 2017-05-23 DIAGNOSIS — M25552 Pain in left hip: Secondary | ICD-10-CM | POA: Diagnosis not present

## 2017-05-23 NOTE — Telephone Encounter (Signed)
REFILL 

## 2017-06-01 ENCOUNTER — Other Ambulatory Visit: Payer: Self-pay | Admitting: Cardiovascular Disease

## 2017-06-01 NOTE — Telephone Encounter (Signed)
REFILL 

## 2017-06-13 DIAGNOSIS — H9201 Otalgia, right ear: Secondary | ICD-10-CM | POA: Diagnosis not present

## 2017-06-20 DIAGNOSIS — M25552 Pain in left hip: Secondary | ICD-10-CM | POA: Diagnosis not present

## 2017-07-18 ENCOUNTER — Other Ambulatory Visit: Payer: Self-pay | Admitting: Cardiovascular Disease

## 2017-07-18 NOTE — Telephone Encounter (Signed)
REFILL 

## 2017-07-21 DIAGNOSIS — H9201 Otalgia, right ear: Secondary | ICD-10-CM | POA: Diagnosis not present

## 2017-07-21 DIAGNOSIS — E785 Hyperlipidemia, unspecified: Secondary | ICD-10-CM | POA: Diagnosis not present

## 2017-07-21 DIAGNOSIS — Z6823 Body mass index (BMI) 23.0-23.9, adult: Secondary | ICD-10-CM | POA: Diagnosis not present

## 2017-07-21 DIAGNOSIS — I10 Essential (primary) hypertension: Secondary | ICD-10-CM | POA: Diagnosis not present

## 2017-07-26 DIAGNOSIS — I10 Essential (primary) hypertension: Secondary | ICD-10-CM | POA: Diagnosis not present

## 2017-07-26 DIAGNOSIS — I252 Old myocardial infarction: Secondary | ICD-10-CM | POA: Diagnosis not present

## 2017-07-26 DIAGNOSIS — Z0001 Encounter for general adult medical examination with abnormal findings: Secondary | ICD-10-CM | POA: Diagnosis not present

## 2017-08-24 ENCOUNTER — Other Ambulatory Visit: Payer: Self-pay | Admitting: Cardiovascular Disease

## 2017-08-26 ENCOUNTER — Telehealth: Payer: Self-pay | Admitting: Pulmonary Disease

## 2017-08-26 MED ORDER — AZITHROMYCIN 250 MG PO TABS: ORAL_TABLET | ORAL | 0 refills | Status: DC

## 2017-08-26 NOTE — Telephone Encounter (Signed)
Pt c/o runny nose, sore scratchy throat, chest congestion, sinus congestion, prod cough with white mucus, fatigue X2-3 days. Denies fever, chest pain, chills, sweats.    Taking Mucinex, requesting additional recs.  Uses [employer].  SN please advise.  Thanks!

## 2017-08-26 NOTE — Telephone Encounter (Signed)
Per SN: Please order z pack. Take as directed.

## 2017-08-31 ENCOUNTER — Telehealth: Payer: Self-pay | Admitting: Pulmonary Disease

## 2017-08-31 MED ORDER — LEVOFLOXACIN 500 MG PO TABS: 500.0000 mg | ORAL_TABLET | Freq: Every day | ORAL | 0 refills | Status: DC

## 2017-08-31 MED ORDER — PREDNISONE 5 MG (21) PO TBPK: ORAL_TABLET | ORAL | 0 refills | Status: DC

## 2017-08-31 NOTE — Telephone Encounter (Signed)
Called patient back due to flag stating allergy to prednisone. Patient states he does not have allergy. Will send medication in.

## 2017-08-31 NOTE — Telephone Encounter (Signed)
Per SN Call in Levaquin 500mg  daily #7, Use neb TID,symbicort and spriva, Prednisone 5 mg 6 day pack  #21  take as directed, Follow up next week or two if no better.

## 2017-08-31 NOTE — Telephone Encounter (Signed)
Called and spoke with pt.  Pt is requesting apt with SN- no availability  this week with SN.  Pt states he developed a head cold that has moved to his chest. Pt reports of chest/head congestion and left side discomfort.  (feels like a pulled a muscle) x7. Pt is concerned about PNA, as his symptoms are similar to before when he was dx with PNA.  Denies additional symptoms. Pt was prescribed zpak on 08/26/17 with no improvement in symptoms.   SN please advise. Thanks.   Current Outpatient Medications on File Prior to Visit  Medication Sig Dispense Refill  . albuterol (PROVENTIL) (2.5 MG/3ML) 0.083% nebulizer solution Take 3 mLs (2.5 mg total) by nebulization every 6 (six) hours as needed for wheezing or shortness of breath. Patient will also need nebulizer machine please 75 mL 12  . ALPRAZolam (XANAX) 0.5 MG tablet Take 1 tablet (0.5 mg total) by mouth at bedtime. 30 tablet 0  . amLODipine (NORVASC) 5 MG tablet TAKE ONE (1) TABLET BY MOUTH EVERY DAY 90 tablet 1  . aspirin 81 MG EC tablet Take 81 mg by mouth daily.      Marland Kitchen azithromycin (ZITHROMAX) 250 MG tablet Take 2 today then 1 daily until gone 6 tablet 0  . azithromycin (ZITHROMAX) 250 MG tablet Take as directed. 6 tablet 0  . budesonide-formoterol (SYMBICORT) 160-4.5 MCG/ACT inhaler INHALE TWO PUFFS INTO THE LUNGS TWO TIMES DAILY 3 Inhaler 3  . budesonide-formoterol (SYMBICORT) 160-4.5 MCG/ACT inhaler Inhale 2 puffs into the lungs 2 (two) times daily. 2 Inhaler 0  . clopidogrel (PLAVIX) 75 MG tablet TAKE ONE (1) TABLET EACH DAY 90 tablet 1  . latanoprost (XALATAN) 0.005 % ophthalmic solution Place 1 drop into both eyes at bedtime.     Marland Kitchen losartan (COZAAR) 100 MG tablet TAKE ONE (1) TABLET EACH DAY 90 tablet 2  . metoprolol succinate (TOPROL-XL) 50 MG 24 hr tablet TAKE ONE (1) TABLET EACH DAY 90 tablet 1  . montelukast (SINGULAIR) 10 MG tablet Take 1 tablet by mouth daily.    . Multiple Vitamins-Minerals (CENTRUM SILVER PO) Take 1 tablet by mouth  daily.      . nitroGLYCERIN (NITROSTAT) 0.4 MG SL tablet Place 1 tablet (0.4 mg total) under the tongue every 5 (five) minutes as needed for chest pain. 25 tablet 3  . pantoprazole (PROTONIX) 40 MG tablet TAKE ONE (1) TABLET EACH DAY 90 tablet 1  . ranitidine (ZANTAC) 150 MG tablet Take 150 mg by mouth at bedtime.    . rosuvastatin (CRESTOR) 10 MG tablet TAKE ONE (1) TABLET EACH DAY 90 tablet 1  . sucralfate (CARAFATE) 1 G tablet Take 1 tablet (1 g total) by mouth 4 (four) times daily -  with meals and at bedtime. 40 tablet 0  . timolol (TIMOPTIC) 0.5 % ophthalmic solution Place 1 drop into both eyes daily.     Marland Kitchen tiotropium (SPIRIVA) 18 MCG inhalation capsule Place 1 capsule (18 mcg total) into inhaler and inhale daily. 30 capsule 6  . Tiotropium Bromide Monohydrate (SPIRIVA RESPIMAT) 1.25 MCG/ACT AERS Inhale 2 puffs into the lungs daily. 2 Inhaler 0   No current facility-administered medications on file prior to visit.     Allergies  Allergen Reactions  . Altace [Ramipril] Cough  . Prednisone Other (See Comments)    "throat broke out"

## 2017-09-26 ENCOUNTER — Encounter: Payer: Self-pay | Admitting: Gastroenterology

## 2017-09-26 ENCOUNTER — Telehealth: Payer: Self-pay | Admitting: Internal Medicine

## 2017-09-26 ENCOUNTER — Ambulatory Visit: Payer: Medicare Other | Admitting: Gastroenterology

## 2017-09-26 ENCOUNTER — Encounter: Payer: Self-pay | Admitting: Internal Medicine

## 2017-09-26 ENCOUNTER — Encounter: Payer: Self-pay | Admitting: *Deleted

## 2017-09-26 ENCOUNTER — Telehealth: Payer: Self-pay | Admitting: *Deleted

## 2017-09-26 ENCOUNTER — Other Ambulatory Visit: Payer: Self-pay | Admitting: *Deleted

## 2017-09-26 VITALS — BP 152/73 | HR 82 | Temp 96.5°F | Ht 68.0 in | Wt 164.4 lb

## 2017-09-26 DIAGNOSIS — C159 Malignant neoplasm of esophagus, unspecified: Secondary | ICD-10-CM

## 2017-09-26 DIAGNOSIS — R1310 Dysphagia, unspecified: Secondary | ICD-10-CM | POA: Diagnosis not present

## 2017-09-26 DIAGNOSIS — Z86010 Personal history of colonic polyps: Secondary | ICD-10-CM

## 2017-09-26 DIAGNOSIS — G8929 Other chronic pain: Secondary | ICD-10-CM | POA: Insufficient documentation

## 2017-09-26 DIAGNOSIS — R1013 Epigastric pain: Secondary | ICD-10-CM | POA: Diagnosis not present

## 2017-09-26 DIAGNOSIS — R1319 Other dysphagia: Secondary | ICD-10-CM | POA: Insufficient documentation

## 2017-09-26 DIAGNOSIS — R109 Unspecified abdominal pain: Secondary | ICD-10-CM

## 2017-09-26 NOTE — Telephone Encounter (Signed)
Spoke with pt, pt is currently on Plavix 75mg  daily. Plavix will be added to pts list.

## 2017-09-26 NOTE — H&P (View-Only) (Signed)
Primary Care Physician:  Celene Squibb, MD  Primary Gastroenterologist:  Garfield Cornea, MD   Chief Complaint  Patient presents with  . Dysphagia    HPI:  Gregory Eaton is a 72 y.o. male here for dysphagia and due for colonoscopy. Patient was seen back in August 2017. 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 Also colonic adenomas removed in 2010, overdue for surveillance. Plans were for EGD and colonoscopy in 2017 but patient was going out of town, and apparently for whatever reason he was never scheduled. See past surgical history for details of last endoscopy and colonoscopy.  Complains of epigastric pain just at night time. Feels like squeezing pain. Wakes him up several times per night. Has been occurring for months.  More lately regurgitation, belching a lot. Not every day. During the day feels much better. Appetite is good. Has gained weight in the last year or so. Few weeks ago some swallowing issues, thinks it was related to bad cold/copd/drainage. Doesn't happen all the time. BM every couple of days. Small stools. Sometimes strains. Takes miralax once in awhile. Metamucil every morning. No melena, brbpr.   Can't hold a lot in the stomach at one time. Worried about being able to take his prep.  Current Outpatient Medications  Medication Sig Dispense Refill  . albuterol (PROVENTIL) (2.5 MG/3ML) 0.083% nebulizer solution Take 3 mLs (2.5 mg total) by nebulization every 6 (six) hours as needed for wheezing or shortness of breath. Patient will also need nebulizer machine please 75 mL 12  . ALPRAZolam (XANAX) 0.5 MG tablet Take 1 tablet (0.5 mg total) by mouth at bedtime. 30 tablet 0  . amLODipine (NORVASC) 5 MG tablet TAKE ONE (1) TABLET BY MOUTH EVERY DAY 90 tablet 1  . aspirin 81 MG EC tablet Take 81 mg by mouth daily.      . budesonide-formoterol (SYMBICORT) 160-4.5 MCG/ACT inhaler INHALE TWO PUFFS INTO THE LUNGS TWO TIMES DAILY 3  Inhaler 3  . budesonide-formoterol (SYMBICORT) 160-4.5 MCG/ACT inhaler Inhale 2 puffs into the lungs 2 (two) times daily. 2 Inhaler 0  . clopidogrel (PLAVIX) 75 MG tablet TAKE ONE (1) TABLET EACH DAY 90 tablet 1  . latanoprost (XALATAN) 0.005 % ophthalmic solution Place 1 drop into both eyes at bedtime.     Marland Kitchen losartan (COZAAR) 100 MG tablet TAKE ONE (1) TABLET EACH DAY 90 tablet 2  . metoprolol succinate (TOPROL-XL) 50 MG 24 hr tablet TAKE ONE (1) TABLET EACH DAY 90 tablet 1  . montelukast (SINGULAIR) 10 MG tablet Take 1 tablet by mouth daily.    . Multiple Vitamins-Minerals (CENTRUM SILVER PO) Take 1 tablet by mouth daily.      . nitroGLYCERIN (NITROSTAT) 0.4 MG SL tablet Place 1 tablet (0.4 mg total) under the tongue every 5 (five) minutes as needed for chest pain. 25 tablet 3  . pantoprazole (PROTONIX) 40 MG tablet TAKE ONE (1) TABLET EACH DAY 90 tablet 1  . ranitidine (ZANTAC) 150 MG tablet Take 150 mg by mouth at bedtime.    . rosuvastatin (CRESTOR) 10 MG tablet TAKE ONE (1) TABLET EACH DAY 90 tablet 1  . sucralfate (CARAFATE) 1 G tablet Take 1 tablet (1 g total) by mouth 4 (four) times daily -  with meals and at bedtime. 40 tablet 0  . timolol (TIMOPTIC) 0.5 % ophthalmic solution Place 1 drop into both eyes daily.     . Tiotropium Bromide Monohydrate (SPIRIVA RESPIMAT)  1.25 MCG/ACT AERS Inhale 2 puffs into the lungs daily. 2 Inhaler 0   No current facility-administered medications for this visit.     Allergies as of 09/26/2017 - Review Complete 09/26/2017  Allergen Reaction Noted  . Altace [ramipril] Cough 04/07/2014  . Prednisone Other (See Comments) 10/28/2010    Past Medical History:  Diagnosis Date  . Adenomatous colon polyp 03/2009   Last colonoscopy by Dr. [name]   . Adenomatous polyp 2010  . Adenomatous polyp of colon 11/03/2010  . Barrett's esophagus   . CAD (coronary artery disease)   . COPD (chronic obstructive pulmonary disease) (HCC)    Severe emphysema per CT  .  Diverticulosis   . DM type 2 (diabetes mellitus, type 2) ([place]) 04/09/2014   A1c 6.5 (04/08/14)   . Esophageal carcinoma ([place]) 03/2009   T1N1M0  . GERD (gastroesophageal reflux disease)   . History of Doppler ultrasound 11/09/2011   03/2014- 50-69% L ICA stenosis;carotid doppler; L bulb/prox ICA 0-49% diameter reduction; L vertebral artery - occlusive ds; L ECA  demonstrates severe amount of fibrous plaque  . History of Doppler ultrasound 11/09/11   LEAs; R ABI - mod art. insuff.; L ABI normal at rest; R SFA - occlusive ds; L SFA - occlusive ds; patent fem-pop graft  . History of echocardiogram 08/27/2009   EF >55%  . History of nuclear stress test 11/24/2011   lexiscan; normal perfusion; low risk scan; non-diagnostic for ischemia  . Hyperlipidemia   . Hypertension   . Left carotid artery stenosis 04/08/2014  . Pulmonary nodule, right 04/08/2014   2.8 mm-incidental finding on CT  . PVD (peripheral vascular disease) ([place])   . Tachyarrhythmia 1999   Status post ablation at [facility]  . Tobacco abuse     Past Surgical History:  Procedure Laterality Date  . COLONOSCOPY  03/17/2009   Dr.Rourk- normal rectum, sigmoid diverticula, some pale sigmoid mucosa with diffuse petechiae. pedunculated polyp at the splenic flexure, remainder of colonic mucosa appeared normal. bx= adenomatous polyp  . COLONOSCOPY  04/19/2003   Dr.Rehman- few diverticu;a at the sigmoid colon, 3 small polyps, one at the transverse colon and 2 at the sigmoid, small external hemorrhoids. bx report not available.   . CORONARY ARTERY BYPASS GRAFT  1998   [name]  . ESOPHAGECTOMY  2010   [facility] Dr. [name]  . ESOPHAGOGASTRODUODENOSCOPY  11/25/2010   Dr.Rourk- s/p esophagectomy with gastric pull-up, esophageal erosions straddling the surgical anastomosis, salmon colored epithelium coming up a good centimeter to a centimeter and a half above the suture line, islands of salmon colored epithelium in the most poximal residual esophagus,  remainder of gastric mucosa appeared normal. bx= swamous &gastric glandular mucosa w/chronic active inflammation  . ESOPHAGOGASTRODUODENOSCOPY  03/17/2009   Dr.Rourk- 4cm segment of salmon-colored epithlium distal esophagus suspicious for barretts esophagus. area of suspicious nodularity w/in this segment bx seperately. small to moderate size hiatal hernia, o/w normal stomach D1 and D2 bx=adenocarcinoma  . FEMORAL-POPLITEAL BYPASS GRAFT  1993   occlusive ds in R SFA  . GASTRIC PULL THROUGH  2010   With esophagectomy  . LIPOMA EXCISION  2010    Family History  Problem Relation Age of Onset  . GER disease Mother   . Coronary artery disease Brother   . Congenital heart disease Sister   . Colon cancer Neg Hx     Social History   Socioeconomic History  . Marital status: Married    Spouse name: Not on file  .  Number of children: 2  . Years of education: Not on file  . Highest education level: Not on file  Social Needs  . Financial resource strain: Not on file  . Food insecurity - worry: Not on file  . Food insecurity - inability: Not on file  . Transportation needs - medical: Not on file  . Transportation needs - non-medical: Not on file  Occupational History  . Occupation: retired    Fish farm manager: RETIRED    Comment: truck Geophysicist/field seismologist  Tobacco Use  . Smoking status: Former Smoker    Packs/day: 2.00    Years: 40.00    Pack years: 80.00    Types: Cigarettes    Last attempt to quit: 07/20/1995    Years since quitting: 22.2  . Smokeless tobacco: Never Used  Substance and Sexual Activity  . Alcohol use: No    Alcohol/week: 0.0 oz  . Drug use: No  . Sexual activity: Yes    Partners: Female    Birth control/protection: Condom    Comment: friend  Other Topics Concern  . Not on file  Social History Narrative  . Not on file      ROS:  General: Negative for anorexia, weight loss, fever, chills, fatigue, weakness. Eyes: Negative for vision changes.  ENT: Negative for hoarseness,  difficulty swallowing , nasal congestion. CV: Negative for chest pain, angina, palpitations,  peripheral edema. +DOE Respiratory: Negative for dyspnea at rest,  cough, sputum, wheezing. +DOE GI: See history of present illness. GU:  Negative for dysuria, hematuria, urinary incontinence, urinary frequency, nocturnal urination.  MS: Negative for joint pain, low back pain.  Derm: Negative for rash or itching.  Neuro: Negative for weakness, abnormal sensation, seizure, frequent headaches, memory loss, confusion.  Psych: Negative for anxiety, depression, suicidal ideation, hallucinations.  Endo: Negative for unusual weight change.  Heme: Negative for bruising or bleeding. Allergy: Negative for rash or hives.    Physical Examination:  BP (!) 152/73   Pulse 82   Temp (!) 96.5 F (35.8 C) (Oral)   Ht 5\' 8"  (1.727 m)   Wt 164 lb 6.4 oz (74.6 kg)   BMI 25.00 kg/m    General: Well-nourished, well-developed in no acute distress.  Head: Normocephalic, atraumatic.   Eyes: Conjunctiva pink, no icterus. Mouth: Oropharyngeal mucosa moist and pink , no lesions erythema or exudate. Neck: Supple without thyromegaly, masses, or lymphadenopathy.  Lungs: Clear to auscultation bilaterally.  Heart: Regular rate and rhythm, no murmurs rubs or gallops.  Abdomen: Bowel sounds are normal, nontender, nondistended, no hepatosplenomegaly or masses, no abdominal bruits or    hernia , no rebound or guarding.   Rectal: not performed Extremities: No lower extremity edema. No clubbing or deformities.  Neuro: Alert and oriented x 4 , grossly normal neurologically.  Skin: Warm and dry, no rash or jaundice.   Psych: Alert and cooperative, normal mood and affect.   Imaging Studies: No results found.

## 2017-09-26 NOTE — Progress Notes (Signed)
CC'D TO PCP °

## 2017-09-26 NOTE — Assessment & Plan Note (Addendum)
Overdue for surveillance colonoscopy for history of adenomas colon polyps. Due to polypharmacy, will utilize will plan on deep sedation. Also requested patient to take MiraLAX daily until regular soft bowel movements, cutting back to every other day or half dose daily only if his tools are too frequent or loose at least until he has his colonoscopy. After that he can use daily prn.  I have discussed the risks, alternatives, benefits with regards to but not limited to the risk of reaction to medication, bleeding, infection, perforation and the patient is agreeable to proceed. Written consent to be obtained.  Patient has concerns about being able to consume significant quantities of fluid at any short interval since his surgery. We will use low volume prep the patient is aware that he must consume additional glasses of water to make the prep work.

## 2017-09-26 NOTE — Assessment & Plan Note (Signed)
History of esophagectomy in 2010 for adenocarcinoma of the esophagus. Complains of persistent nocturnal epigastric pain, wakes him up from sleep several times nightly. More recently with regurgitation, belching. Feels like food is not go down well on occasion but tends to be more related to when he is having COPD flare. Recommend upper endoscopy with possible dilation based on findings. Plan for deep sedation given polypharmacy.  I have discussed the risks, alternatives, benefits with regards to but not limited to the risk of reaction to medication, bleeding, infection, perforation and the patient is agreeable to proceed. Written consent to be obtained.

## 2017-09-26 NOTE — Progress Notes (Signed)
Primary Care Physician:  Celene Squibb, MD  Primary Gastroenterologist:  Garfield Cornea, MD   Chief Complaint  Patient presents with  . Dysphagia    HPI:  Gregory Eaton is a 72 y.o. male here for dysphagia and due for colonoscopy. Patient was seen back in August 2017. 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 Also colonic adenomas removed in 2010, overdue for surveillance. Plans were for EGD and colonoscopy in 2017 but patient was going out of town, and apparently for whatever reason he was never scheduled. See past surgical history for details of last endoscopy and colonoscopy.  Complains of epigastric pain just at night time. Feels like squeezing pain. Wakes him up several times per night. Has been occurring for months.  More lately regurgitation, belching a lot. Not every day. During the day feels much better. Appetite is good. Has gained weight in the last year or so. Few weeks ago some swallowing issues, thinks it was related to bad cold/copd/drainage. Doesn't happen all the time. BM every couple of days. Small stools. Sometimes strains. Takes miralax once in awhile. Metamucil every morning. No melena, brbpr.   Can't hold a lot in the stomach at one time. Worried about being able to take his prep.  Current Outpatient Medications  Medication Sig Dispense Refill  . albuterol (PROVENTIL) (2.5 MG/3ML) 0.083% nebulizer solution Take 3 mLs (2.5 mg total) by nebulization every 6 (six) hours as needed for wheezing or shortness of breath. Patient will also need nebulizer machine please 75 mL 12  . ALPRAZolam (XANAX) 0.5 MG tablet Take 1 tablet (0.5 mg total) by mouth at bedtime. 30 tablet 0  . amLODipine (NORVASC) 5 MG tablet TAKE ONE (1) TABLET BY MOUTH EVERY DAY 90 tablet 1  . aspirin 81 MG EC tablet Take 81 mg by mouth daily.      . budesonide-formoterol (SYMBICORT) 160-4.5 MCG/ACT inhaler INHALE TWO PUFFS INTO THE LUNGS TWO TIMES DAILY 3  Inhaler 3  . budesonide-formoterol (SYMBICORT) 160-4.5 MCG/ACT inhaler Inhale 2 puffs into the lungs 2 (two) times daily. 2 Inhaler 0  . clopidogrel (PLAVIX) 75 MG tablet TAKE ONE (1) TABLET EACH DAY 90 tablet 1  . latanoprost (XALATAN) 0.005 % ophthalmic solution Place 1 drop into both eyes at bedtime.     Marland Kitchen losartan (COZAAR) 100 MG tablet TAKE ONE (1) TABLET EACH DAY 90 tablet 2  . metoprolol succinate (TOPROL-XL) 50 MG 24 hr tablet TAKE ONE (1) TABLET EACH DAY 90 tablet 1  . montelukast (SINGULAIR) 10 MG tablet Take 1 tablet by mouth daily.    . Multiple Vitamins-Minerals (CENTRUM SILVER PO) Take 1 tablet by mouth daily.      . nitroGLYCERIN (NITROSTAT) 0.4 MG SL tablet Place 1 tablet (0.4 mg total) under the tongue every 5 (five) minutes as needed for chest pain. 25 tablet 3  . pantoprazole (PROTONIX) 40 MG tablet TAKE ONE (1) TABLET EACH DAY 90 tablet 1  . ranitidine (ZANTAC) 150 MG tablet Take 150 mg by mouth at bedtime.    . rosuvastatin (CRESTOR) 10 MG tablet TAKE ONE (1) TABLET EACH DAY 90 tablet 1  . sucralfate (CARAFATE) 1 G tablet Take 1 tablet (1 g total) by mouth 4 (four) times daily -  with meals and at bedtime. 40 tablet 0  . timolol (TIMOPTIC) 0.5 % ophthalmic solution Place 1 drop into both eyes daily.     . Tiotropium Bromide Monohydrate (SPIRIVA RESPIMAT)  1.25 MCG/ACT AERS Inhale 2 puffs into the lungs daily. 2 Inhaler 0   No current facility-administered medications for this visit.     Allergies as of 09/26/2017 - Review Complete 09/26/2017  Allergen Reaction Noted  . Altace [ramipril] Cough 04/07/2014  . Prednisone Other (See Comments) 10/28/2010    Past Medical History:  Diagnosis Date  . Adenomatous colon polyp 03/2009   Last colonoscopy by Dr. [name]   . Adenomatous polyp 2010  . Adenomatous polyp of colon 11/03/2010  . Barrett's esophagus   . CAD (coronary artery disease)   . COPD (chronic obstructive pulmonary disease) ([facility])    Severe emphysema per CT  .  Diverticulosis   . DM type 2 (diabetes mellitus, type 2) ([place]) 04/09/2014   A1c 6.5 (04/08/14)   . Esophageal carcinoma ([place]) 03/2009   T1N1M0  . GERD (gastroesophageal reflux disease)   . History of Doppler ultrasound 11/09/2011   03/2014- 50-69% L ICA stenosis;carotid doppler; L bulb/prox ICA 0-49% diameter reduction; L vertebral artery - occlusive ds; L ECA  demonstrates severe amount of fibrous plaque  . History of Doppler ultrasound 11/09/11   LEAs; R ABI - mod art. insuff.; L ABI normal at rest; R SFA - occlusive ds; L SFA - occlusive ds; patent fem-pop graft  . History of echocardiogram 08/27/2009   EF >55%  . History of nuclear stress test 11/24/2011   lexiscan; normal perfusion; low risk scan; non-diagnostic for ischemia  . Hyperlipidemia   . Hypertension   . Left carotid artery stenosis 04/08/2014  . Pulmonary nodule, right 04/08/2014   2.8 mm-incidental finding on CT  . PVD (peripheral vascular disease) ([place])   . Tachyarrhythmia 1999   Status post ablation at [facility]  . Tobacco abuse     Past Surgical History:  Procedure Laterality Date  . COLONOSCOPY  03/17/2009   Dr.Rourk- normal rectum, sigmoid diverticula, some pale sigmoid mucosa with diffuse petechiae. pedunculated polyp at the splenic flexure, remainder of colonic mucosa appeared normal. bx= adenomatous polyp  . COLONOSCOPY  04/19/2003   Dr.Rehman- few diverticu;a at the sigmoid colon, 3 small polyps, one at the transverse colon and 2 at the sigmoid, small external hemorrhoids. bx report not available.   . CORONARY ARTERY BYPASS GRAFT  1998   [name]  . ESOPHAGECTOMY  2010   [facility] Dr. [name]  . ESOPHAGOGASTRODUODENOSCOPY  11/25/2010   Dr.Rourk- s/p esophagectomy with gastric pull-up, esophageal erosions straddling the surgical anastomosis, salmon colored epithelium coming up a good centimeter to a centimeter and a half above the suture line, islands of salmon colored epithelium in the most poximal residual esophagus,  remainder of gastric mucosa appeared normal. bx= swamous &gastric glandular mucosa w/chronic active inflammation  . ESOPHAGOGASTRODUODENOSCOPY  03/17/2009   Dr.Rourk- 4cm segment of salmon-colored epithlium distal esophagus suspicious for barretts esophagus. area of suspicious nodularity w/in this segment bx seperately. small to moderate size hiatal hernia, o/w normal stomach D1 and D2 bx=adenocarcinoma  . FEMORAL-POPLITEAL BYPASS GRAFT  1993   occlusive ds in R SFA  . GASTRIC PULL THROUGH  2010   With esophagectomy  . LIPOMA EXCISION  2010    Family History  Problem Relation Age of Onset  . GER disease Mother   . Coronary artery disease Brother   . Congenital heart disease Sister   . Colon cancer Neg Hx     Social History   Socioeconomic History  . Marital status: Married    Spouse name: Not on file  .  Number of children: 2  . Years of education: Not on file  . Highest education level: Not on file  Social Needs  . Financial resource strain: Not on file  . Food insecurity - worry: Not on file  . Food insecurity - inability: Not on file  . Transportation needs - medical: Not on file  . Transportation needs - non-medical: Not on file  Occupational History  . Occupation: retired    Fish farm manager: RETIRED    Comment: truck Geophysicist/field seismologist  Tobacco Use  . Smoking status: Former Smoker    Packs/day: 2.00    Years: 40.00    Pack years: 80.00    Types: Cigarettes    Last attempt to quit: 07/20/1995    Years since quitting: 22.2  . Smokeless tobacco: Never Used  Substance and Sexual Activity  . Alcohol use: No    Alcohol/week: 0.0 oz  . Drug use: No  . Sexual activity: Yes    Partners: Female    Birth control/protection: Condom    Comment: friend  Other Topics Concern  . Not on file  Social History Narrative  . Not on file      ROS:  General: Negative for anorexia, weight loss, fever, chills, fatigue, weakness. Eyes: Negative for vision changes.  ENT: Negative for hoarseness,  difficulty swallowing , nasal congestion. CV: Negative for chest pain, angina, palpitations,  peripheral edema. +DOE Respiratory: Negative for dyspnea at rest,  cough, sputum, wheezing. +DOE GI: See history of present illness. GU:  Negative for dysuria, hematuria, urinary incontinence, urinary frequency, nocturnal urination.  MS: Negative for joint pain, low back pain.  Derm: Negative for rash or itching.  Neuro: Negative for weakness, abnormal sensation, seizure, frequent headaches, memory loss, confusion.  Psych: Negative for anxiety, depression, suicidal ideation, hallucinations.  Endo: Negative for unusual weight change.  Heme: Negative for bruising or bleeding. Allergy: Negative for rash or hives.    Physical Examination:  BP (!) 152/73   Pulse 82   Temp (!) 96.5 F (35.8 C) (Oral)   Ht 5\' 8"  (1.727 m)   Wt 164 lb 6.4 oz (74.6 kg)   BMI 25.00 kg/m    General: Well-nourished, well-developed in no acute distress.  Head: Normocephalic, atraumatic.   Eyes: Conjunctiva pink, no icterus. Mouth: Oropharyngeal mucosa moist and pink , no lesions erythema or exudate. Neck: Supple without thyromegaly, masses, or lymphadenopathy.  Lungs: Clear to auscultation bilaterally.  Heart: Regular rate and rhythm, no murmurs rubs or gallops.  Abdomen: Bowel sounds are normal, nontender, nondistended, no hepatosplenomegaly or masses, no abdominal bruits or    hernia , no rebound or guarding.   Rectal: not performed Extremities: No lower extremity edema. No clubbing or deformities.  Neuro: Alert and oriented x 4 , grossly normal neurologically.  Skin: Warm and dry, no rash or jaundice.   Psych: Alert and cooperative, normal mood and affect.   Imaging Studies: No results found.

## 2017-09-26 NOTE — Telephone Encounter (Signed)
Pt was seen this morning and called back to let the nurse know that he takes a blood thinner (Plavix)

## 2017-09-26 NOTE — Telephone Encounter (Signed)
Called spoke with pt and is aware pre-op is scheduled for 10/19/17 at 10:00am. Letter mailed.

## 2017-09-26 NOTE — Patient Instructions (Signed)
1. Take Miralax once capful daily to get your bowel regular/soft especially before your colonoscopy. You may cut back to 1/2 dose or take 1 dose every other day if you are going too frequently or stools are too loose.  2. Colonoscopy and upper endoscopy as scheduled. See separate instructions.

## 2017-09-27 DIAGNOSIS — H401130 Primary open-angle glaucoma, bilateral, stage unspecified: Secondary | ICD-10-CM | POA: Diagnosis not present

## 2017-10-03 ENCOUNTER — Telehealth: Payer: Self-pay | Admitting: Internal Medicine

## 2017-10-03 DIAGNOSIS — Z6824 Body mass index (BMI) 24.0-24.9, adult: Secondary | ICD-10-CM | POA: Diagnosis not present

## 2017-10-03 DIAGNOSIS — R1013 Epigastric pain: Secondary | ICD-10-CM | POA: Diagnosis not present

## 2017-10-03 DIAGNOSIS — I10 Essential (primary) hypertension: Secondary | ICD-10-CM | POA: Diagnosis not present

## 2017-10-03 DIAGNOSIS — Z6823 Body mass index (BMI) 23.0-23.9, adult: Secondary | ICD-10-CM | POA: Diagnosis not present

## 2017-10-03 DIAGNOSIS — E785 Hyperlipidemia, unspecified: Secondary | ICD-10-CM | POA: Diagnosis not present

## 2017-10-03 NOTE — Telephone Encounter (Signed)
Currently we do not have anything sooner. Patient is aware. Nothing further needed

## 2017-10-03 NOTE — Telephone Encounter (Signed)
PATIENT WOULD LIKE HIS PROCEDURE SOONER IF POSSIBLE, CAN YOU ADD HIM TO A CANCELLATION LIST?

## 2017-10-04 ENCOUNTER — Ambulatory Visit (HOSPITAL_COMMUNITY)
Admission: RE | Admit: 2017-10-04 | Discharge: 2017-10-04 | Disposition: A | Discharge status: Home / Self Care | Payer: Medicare Other | Source: Ambulatory Visit | Attending: Internal Medicine | Admitting: Internal Medicine

## 2017-10-04 ENCOUNTER — Other Ambulatory Visit: Payer: Self-pay | Admitting: Internal Medicine

## 2017-10-04 DIAGNOSIS — R109 Unspecified abdominal pain: Secondary | ICD-10-CM | POA: Insufficient documentation

## 2017-10-04 DIAGNOSIS — R932 Abnormal findings on diagnostic imaging of liver and biliary tract: Secondary | ICD-10-CM | POA: Diagnosis not present

## 2017-10-04 DIAGNOSIS — K8020 Calculus of gallbladder without cholecystitis without obstruction: Secondary | ICD-10-CM | POA: Insufficient documentation

## 2017-10-14 NOTE — Patient Instructions (Signed)
Gregory Eaton  10/14/2017     @PREFPERIOPPHARMACY @   Your procedure is scheduled on  10/24/2017 .  Report to [facility] at  1100   A.M.  Call this number if you have problems the morning of surgery:  [phone]   Remember:  Do not eat food or drink liquids after midnight.  Take these medicines the morning of surgery with A SIP OF WATER  Xanax, norvasc, cozaar, toprol XL, singulair, prilosec or Zantac. Use your nebulizer and your inhaler before you come.   Do not wear jewelry, make-up or nail polish.  Do not wear lotions, powders, or perfumes, or deodorant.  Do not shave 48 hours prior to surgery.  Men may shave face and neck.  Do not bring valuables to the hospital.  [facility] is not responsible for any belongings or valuables.  Contacts, dentures or bridgework may not be worn into surgery.  Leave your suitcase in the car.  After surgery it may be brought to your room.  For patients admitted to the hospital, discharge time will be determined by your treatment team.  Patients discharged the day of surgery will not be allowed to drive home.   Name and phone number of your driver:   family Special instructions:  Follow the diet and prep instructions given to you by Dr Roseanne Kaufman office.  Please read over the following fact sheets that you were given. Anesthesia Post-op Instructions and Care and Recovery After Surgery       Esophagogastroduodenoscopy Esophagogastroduodenoscopy (EGD) is a procedure to examine the lining of the esophagus, stomach, and first part of the small intestine (duodenum). This procedure is done to check for problems such as inflammation, bleeding, ulcers, or growths. During this procedure, a long, flexible, lighted tube with a camera attached (endoscope) is inserted down the throat. Tell a health care provider about:  Any allergies you have.  All medicines you are taking, including vitamins, herbs, eye drops, creams, and  over-the-counter medicines.  Any problems you or family members have had with anesthetic medicines.  Any blood disorders you have.  Any surgeries you have had.  Any medical conditions you have.  Whether you are pregnant or may be pregnant. What are the risks? Generally, this is a safe procedure. However, problems may occur, including:  Infection.  Bleeding.  A tear (perforation) in the esophagus, stomach, or duodenum.  Trouble breathing.  Excessive sweating.  Spasms of the larynx.  A slowed heartbeat.  Low blood pressure.  What happens before the procedure?  Follow instructions from your health care provider about eating or drinking restrictions.  Ask your health care provider about: ? Changing or stopping your regular medicines. This is especially important if you are taking diabetes medicines or blood thinners. ? Taking medicines such as aspirin and ibuprofen. These medicines can thin your blood. Do not take these medicines before your procedure if your health care provider instructs you not to.  Plan to have someone take you home after the procedure.  If you wear dentures, be ready to remove them before the procedure. What happens during the procedure?  To reduce your risk of infection, your health care team will wash or sanitize their hands.  An IV tube will be put in a vein in your hand or arm. You will get medicines and fluids through this tube.  You will be given one or more of the following: ?  A medicine to help you relax (sedative). ? A medicine to numb the area (local anesthetic). This medicine may be sprayed into your throat. It will make you feel more comfortable and keep you from gagging or coughing during the procedure. ? A medicine for pain.  A mouth guard may be placed in your mouth to protect your teeth and to keep you from biting on the endoscope.  You will be asked to lie on your left side.  The endoscope will be lowered down your throat into  your esophagus, stomach, and duodenum.  Air will be put into the endoscope. This will help your health care provider see better.  The lining of your esophagus, stomach, and duodenum will be examined.  Your health care provider may: ? Take a tissue sample so it can be looked at in a lab (biopsy). ? Remove growths. ? Remove objects (foreign bodies) that are stuck. ? Treat any bleeding with medicines or other devices that stop tissue from bleeding. ? Widen (dilate) or stretch narrowed areas of your esophagus and stomach.  The endoscope will be taken out. The procedure may vary among health care providers and hospitals. What happens after the procedure?  Your blood pressure, heart rate, breathing rate, and blood oxygen level will be monitored often until the medicines you were given have worn off.  Do not eat or drink anything until the numbing medicine has worn off and your gag reflex has returned. This information is not intended to replace advice given to you by your health care provider. Make sure you discuss any questions you have with your health care provider. Document Released: 11/05/2004 Document Revised: 12/11/2015 Document Reviewed: 05/29/2015 Elsevier Interactive Patient Education  2018 [employer]. Esophagogastroduodenoscopy, Care After Refer to this sheet in the next few weeks. These instructions provide you with information about caring for yourself after your procedure. Your health care provider may also give you more specific instructions. Your treatment has been planned according to current medical practices, but problems sometimes occur. Call your health care provider if you have any problems or questions after your procedure. What can I expect after the procedure? After the procedure, it is common to have:  A sore throat.  Nausea.  Bloating.  Dizziness.  Fatigue.  Follow these instructions at home:  Do not eat or drink anything until the numbing medicine  (local anesthetic) has worn off and your gag reflex has returned. You will know that the local anesthetic has worn off when you can swallow comfortably.  Do not drive for 24 hours if you received a medicine to help you relax (sedative).  If your health care provider took a tissue sample for testing during the procedure, make sure to get your test results. This is your responsibility. Ask your health care provider or the department performing the test when your results will be ready.  Keep all follow-up visits as told by your health care provider. This is important. Contact a health care provider if:  You cannot stop coughing.  You are not urinating.  You are urinating less than usual. Get help right away if:  You have trouble swallowing.  You cannot eat or drink.  You have throat or chest pain that gets worse.  You are dizzy or light-headed.  You faint.  You have nausea or vomiting.  You have chills.  You have a fever.  You have severe abdominal pain.  You have black, tarry, or bloody stools. This information is not intended to  replace advice given to you by your health care provider. Make sure you discuss any questions you have with your health care provider. Document Released: 06/21/2012 Document Revised: 12/11/2015 Document Reviewed: 05/29/2015 Elsevier Interactive Patient Education  2018 [employer].  Esophageal Dilatation Esophageal dilatation is a procedure to open a blocked or narrowed part of the esophagus. The esophagus is the long tube in your throat that carries food and liquid from your mouth to your stomach. The procedure is also called esophageal dilation. You may need this procedure if you have a buildup of scar tissue in your esophagus that makes it difficult, painful, or even impossible to swallow. This can be caused by gastroesophageal reflux disease (GERD). In rare cases, people need this procedure because they have cancer of the esophagus or a problem  with the way food moves through the esophagus. Sometimes you may need to have another dilatation to enlarge the opening of the esophagus gradually. Tell a health care provider about:  Any allergies you have.  All medicines you are taking, including vitamins, herbs, eye drops, creams, and over-the-counter medicines.  Any problems you or family members have had with anesthetic medicines.  Any blood disorders you have.  Any surgeries you have had.  Any medical conditions you have.  Any antibiotic medicines you are required to take before dental procedures. What are the risks? Generally, this is a safe procedure. However, problems can occur and include:  Bleeding from a tear in the lining of the esophagus.  A hole (perforation) in the esophagus.  What happens before the procedure?  Do not eat or drink anything after midnight on the night before the procedure or as directed by your health care provider.  Ask your health care provider about changing or stopping your regular medicines. This is especially important if you are taking diabetes medicines or blood thinners.  Plan to have someone take you home after the procedure. What happens during the procedure?  You will be given a medicine that makes you relaxed and sleepy (sedative).  A medicine may be sprayed or gargled to numb the back of the throat.  Your health care provider can use various instruments to do an esophageal dilatation. During the procedure, the instrument used will be placed in your mouth and passed down into your esophagus. Options include: ? Simple dilators. This instrument is carefully placed in the esophagus to stretch it. ? Guided wire bougies. In this method, a flexible tube (endoscope) is used to insert a wire into the esophagus. The dilator is passed over this wire to enlarge the esophagus. Then the wire is removed. ? Balloon dilators. An endoscope with a small balloon at the end is passed down into the  esophagus. Inflating the balloon gently stretches the esophagus and opens it up. What happens after the procedure?  Your blood pressure, heart rate, breathing rate, and blood oxygen level will be monitored often until the medicines you were given have worn off.  Your throat may feel slightly sore and will probably still feel numb. This will improve slowly over time.  You will not be allowed to eat or drink until the throat numbness has resolved.  If this is a same-day procedure, you may be allowed to go home once you have been able to drink, urinate, and sit on the edge of the bed without nausea or dizziness.  If this is a same-day procedure, you should have a friend or family member with you for the next 24 hours after  the procedure. This information is not intended to replace advice given to you by your health care provider. Make sure you discuss any questions you have with your health care provider. Document Released: 08/26/2005 Document Revised: 12/11/2015 Document Reviewed: 11/14/2013 Elsevier Interactive Patient Education  [employer].  Colonoscopy, Adult A colonoscopy is an exam to look at the large intestine. It is done to check for problems, such as:  Lumps (tumors).  Growths (polyps).  Swelling (inflammation).  Bleeding.  What happens before the procedure? Eating and drinking Follow instructions from your doctor about eating and drinking. These instructions may include:  A few days before the procedure - follow a low-fiber diet. ? Avoid nuts. ? Avoid seeds. ? Avoid dried fruit. ? Avoid raw fruits. ? Avoid vegetables.  1-3 days before the procedure - follow a clear liquid diet. Avoid liquids that have red or purple dye. Drink only clear liquids, such as: ? Clear broth or bouillon. ? Black coffee or tea. ? Clear juice. ? Clear soft drinks or sports drinks. ? Gelatin dessert. ? Popsicles.  On the day of the procedure - do not eat or drink anything during  the 2 hours before the procedure.  Bowel prep If you were prescribed an oral bowel prep:  Take it as told by your doctor. Starting the day before your procedure, you will need to drink a lot of liquid. The liquid will cause you to poop (have bowel movements) until your poop is almost clear or light green.  If your skin or butt gets irritated from diarrhea, you may: ? Wipe the area with wipes that have medicine in them, such as adult wet wipes with aloe and vitamin E. ? Put something on your skin that soothes the area, such as petroleum jelly.  If you throw up (vomit) while drinking the bowel prep, take a break for up to 60 minutes. Then begin the bowel prep again. If you keep throwing up and you cannot take the bowel prep without throwing up, call your doctor.  General instructions  Ask your doctor about changing or stopping your normal medicines. This is important if you take diabetes medicines or blood thinners.  Plan to have someone take you home from the hospital or clinic. What happens during the procedure?  An IV tube may be put into one of your veins.  You will be given medicine to help you relax (sedative).  To reduce your risk of infection: ? Your doctors will wash their hands. ? Your anal area will be washed with soap.  You will be asked to lie on your side with your knees bent.  Your doctor will get a long, thin, flexible tube ready. The tube will have a camera and a light on the end.  The tube will be put into your anus.  The tube will be gently put into your large intestine.  Air will be delivered into your large intestine to keep it open. You may feel some pressure or cramping.  The camera will be used to take photos.  A small tissue sample may be removed from your body to be looked at under a microscope (biopsy). If any possible problems are found, the tissue will be sent to a lab for testing.  If small growths are found, your doctor may remove them and have  them checked for cancer.  The tube that was put into your anus will be slowly removed. The procedure may vary among doctors and hospitals. What happens  after the procedure?  Your doctor will check on you often until the medicines you were given have worn off.  Do not drive for 24 hours after the procedure.  You may have a small amount of blood in your poop.  You may pass gas.  You may have mild cramps or bloating in your belly (abdomen).  It is up to you to get the results of your procedure. Ask your doctor, or the department performing the procedure, when your results will be ready. This information is not intended to replace advice given to you by your health care provider. Make sure you discuss any questions you have with your health care provider. Document Released: 08/07/2010 Document Revised: 05/05/2016 Document Reviewed: 09/16/2015 Elsevier Interactive Patient Education  2017 [employer]  Colonoscopy, Adult, Care After This sheet gives you information about how to care for yourself after your procedure. Your health care provider may also give you more specific instructions. If you have problems or questions, contact your health care provider. What can I expect after the procedure? After the procedure, it is common to have:  A small amount of blood in your stool for 24 hours after the procedure.  Some gas.  Mild abdominal cramping or bloating.  Follow these instructions at home: General instructions   For the first 24 hours after the procedure: ? Do not drive or use machinery. ? Do not sign important documents. ? Do not drink alcohol. ? Do your regular daily activities at a slower pace than normal. ? Eat soft, easy-to-digest foods. ? Rest often.  Take over-the-counter or prescription medicines only as told by your health care provider.  It is up to you to get the results of your procedure. Ask your health care provider, or the department performing the  procedure, when your results will be ready. Relieving cramping and bloating  Try walking around when you have cramps or feel bloated.  Apply heat to your abdomen as told by your health care provider. Use a heat source that your health care provider recommends, such as a moist heat pack or a heating pad. ? Place a towel between your skin and the heat source. ? Leave the heat on for 20-30 minutes. ? Remove the heat if your skin turns bright red. This is especially important if you are unable to feel pain, heat, or cold. You may have a greater risk of getting burned. Eating and drinking  Drink enough fluid to keep your urine clear or pale yellow.  Resume your normal diet as instructed by your health care provider. Avoid heavy or fried foods that are hard to digest.  Avoid drinking alcohol for as long as instructed by your health care provider. Contact a health care provider if:  You have blood in your stool 2-3 days after the procedure. Get help right away if:  You have more than a small spotting of blood in your stool.  You pass large blood clots in your stool.  Your abdomen is swollen.  You have nausea or vomiting.  You have a fever.  You have increasing abdominal pain that is not relieved with medicine. This information is not intended to replace advice given to you by your health care provider. Make sure you discuss any questions you have with your health care provider. Document Released: 02/17/2004 Document Revised: 03/29/2016 Document Reviewed: 09/16/2015 Elsevier Interactive Patient Education  2018 [employer] Anesthesia is a term that refers to techniques, procedures, and medicines that  help a person stay safe and comfortable during a medical procedure. Monitored anesthesia care, or sedation, is one type of anesthesia. Your anesthesia specialist may recommend sedation if you will be having a procedure that does not require you to be unconscious,  such as:  Cataract surgery.  A dental procedure.  A biopsy.  A colonoscopy.  During the procedure, you may receive a medicine to help you relax (sedative). There are three levels of sedation:  Mild sedation. At this level, you may feel awake and relaxed. You will be able to follow directions.  Moderate sedation. At this level, you will be sleepy. You may not remember the procedure.  Deep sedation. At this level, you will be asleep. You will not remember the procedure.  The more medicine you are given, the deeper your level of sedation will be. Depending on how you respond to the procedure, the anesthesia specialist may change your level of sedation or the type of anesthesia to fit your needs. An anesthesia specialist will monitor you closely during the procedure. Let your health care provider know about:  Any allergies you have.  All medicines you are taking, including vitamins, herbs, eye drops, creams, and over-the-counter medicines.  Any use of steroids (by mouth or as a cream).  Any problems you or family members have had with sedatives and anesthetic medicines.  Any blood disorders you have.  Any surgeries you have had.  Any medical conditions you have, such as sleep apnea.  Whether you are pregnant or may be pregnant.  Any use of cigarettes, alcohol, or street drugs. What are the risks? Generally, this is a safe procedure. However, problems may occur, including:  Getting too much medicine (oversedation).  Nausea.  Allergic reaction to medicines.  Trouble breathing. If this happens, a breathing tube may be used to help with breathing. It will be removed when you are awake and breathing on your own.  Heart trouble.  Lung trouble.  Before the procedure Staying hydrated Follow instructions from your health care provider about hydration, which may include:  Up to 2 hours before the procedure - you may continue to drink clear liquids, such as water, clear  fruit juice, black coffee, and plain tea.  Eating and drinking restrictions Follow instructions from your health care provider about eating and drinking, which may include:  8 hours before the procedure - stop eating heavy meals or foods such as meat, fried foods, or fatty foods.  6 hours before the procedure - stop eating light meals or foods, such as toast or cereal.  6 hours before the procedure - stop drinking milk or drinks that contain milk.  2 hours before the procedure - stop drinking clear liquids.  Medicines Ask your health care provider about:  Changing or stopping your regular medicines. This is especially important if you are taking diabetes medicines or blood thinners.  Taking medicines such as aspirin and ibuprofen. These medicines can thin your blood. Do not take these medicines before your procedure if your health care provider instructs you not to.  Tests and exams  You will have a physical exam.  You may have blood tests done to show: ? How well your kidneys and liver are working. ? How well your blood can clot.  General instructions  Plan to have someone take you home from the hospital or clinic.  If you will be going home right after the procedure, plan to have someone with you for 24 hours.  What happens during  the procedure?  Your blood pressure, heart rate, breathing, level of pain and overall condition will be monitored.  An IV tube will be inserted into one of your veins.  Your anesthesia specialist will give you medicines as needed to keep you comfortable during the procedure. This may mean changing the level of sedation.  The procedure will be performed. After the procedure  Your blood pressure, heart rate, breathing rate, and blood oxygen level will be monitored until the medicines you were given have worn off.  Do not drive for 24 hours if you received a sedative.  You may: ? Feel sleepy, clumsy, or nauseous. ? Feel forgetful about what  happened after the procedure. ? Have a sore throat if you had a breathing tube during the procedure. ? Vomit. This information is not intended to replace advice given to you by your health care provider. Make sure you discuss any questions you have with your health care provider. Document Released: 03/31/2005 Document Revised: 12/12/2015 Document Reviewed: 10/26/2015 Elsevier Interactive Patient Education  2018 [place], Care After These instructions provide you with information about caring for yourself after your procedure. Your health care provider may also give you more specific instructions. Your treatment has been planned according to current medical practices, but problems sometimes occur. Call your health care provider if you have any problems or questions after your procedure. What can I expect after the procedure? After your procedure, it is common to:  Feel sleepy for several hours.  Feel clumsy and have poor balance for several hours.  Feel forgetful about what happened after the procedure.  Have poor judgment for several hours.  Feel nauseous or vomit.  Have a sore throat if you had a breathing tube during the procedure.  Follow these instructions at home: For at least 24 hours after the procedure:   Do not: ? Participate in activities in which you could fall or become injured. ? Drive. ? Use heavy machinery. ? Drink alcohol. ? Take sleeping pills or medicines that cause drowsiness. ? Make important decisions or sign legal documents. ? Take care of children on your own.  Rest. Eating and drinking  Follow the diet that is recommended by your health care provider.  If you vomit, drink water, juice, or soup when you can drink without vomiting.  Make sure you have little or no nausea before eating solid foods. General instructions  Have a responsible adult stay with you until you are awake and alert.  Take over-the-counter and  prescription medicines only as told by your health care provider.  If you smoke, do not smoke without supervision.  Keep all follow-up visits as told by your health care provider. This is important. Contact a health care provider if:  You keep feeling nauseous or you keep vomiting.  You feel light-headed.  You develop a rash.  You have a fever. Get help right away if:  You have trouble breathing. This information is not intended to replace advice given to you by your health care provider. Make sure you discuss any questions you have with your health care provider. Document Released: 10/26/2015 Document Revised: 02/25/2016 Document Reviewed: 10/26/2015 Elsevier Interactive Patient Education  [employer].

## 2017-10-18 DIAGNOSIS — H401131 Primary open-angle glaucoma, bilateral, mild stage: Secondary | ICD-10-CM | POA: Diagnosis not present

## 2017-10-19 ENCOUNTER — Encounter (HOSPITAL_COMMUNITY): Payer: Self-pay

## 2017-10-19 ENCOUNTER — Other Ambulatory Visit: Payer: Self-pay

## 2017-10-19 ENCOUNTER — Encounter (HOSPITAL_COMMUNITY)
Admission: RE | Admit: 2017-10-19 | Discharge: 2017-10-19 | Disposition: A | Discharge status: Home / Self Care | Payer: Medicare Other | Source: Ambulatory Visit | Attending: Internal Medicine | Admitting: Internal Medicine

## 2017-10-19 DIAGNOSIS — R1310 Dysphagia, unspecified: Secondary | ICD-10-CM | POA: Diagnosis not present

## 2017-10-19 DIAGNOSIS — R1013 Epigastric pain: Secondary | ICD-10-CM | POA: Diagnosis not present

## 2017-10-19 DIAGNOSIS — Z01818 Encounter for other preprocedural examination: Secondary | ICD-10-CM | POA: Insufficient documentation

## 2017-10-19 DIAGNOSIS — Z86010 Personal history of colonic polyps: Secondary | ICD-10-CM | POA: Insufficient documentation

## 2017-10-19 HISTORY — DX: Unspecified glaucoma: H40.9

## 2017-10-19 HISTORY — DX: Personal history of urinary calculi: Z87.442

## 2017-10-19 HISTORY — DX: Unspecified osteoarthritis, unspecified site: M19.90

## 2017-10-19 LAB — BASIC METABOLIC PANEL
ANION GAP: 13 (ref 5–15)
BUN: 13 mg/dL (ref 6–20)
CO2: 21 mmol/L — AB (ref 22–32)
Calcium: 9.1 mg/dL (ref 8.9–10.3)
Chloride: 105 mmol/L (ref 101–111)
Creatinine, Ser: 0.82 mg/dL (ref 0.61–1.24)
GFR calc Af Amer: >60 mL/min (ref >60)
Glucose, Bld: 142 mg/dL — ABNORMAL HIGH (ref 65–99)
POTASSIUM: 4.2 mmol/L (ref 3.5–5.1)
Sodium: 139 mmol/L (ref 135–145)

## 2017-10-19 LAB — CBC
HEMATOCRIT: 44.1 % (ref 39.0–52.0)
Hemoglobin: 14.6 g/dL (ref 13.0–17.0)
MCH: 30.5 pg (ref 26.0–34.0)
MCHC: 33.1 g/dL (ref 30.0–36.0)
MCV: 92.3 fL (ref 78.0–100.0)
PLATELETS: 282 10*3/uL (ref 150–400)
RBC: 4.78 MIL/uL (ref 4.22–5.81)
RDW: 13.1 % (ref 11.5–15.5)
WBC: 6.4 10*3/uL (ref 4.0–10.5)

## 2017-10-21 DIAGNOSIS — K2960 Other gastritis without bleeding: Secondary | ICD-10-CM | POA: Diagnosis not present

## 2017-10-24 ENCOUNTER — Other Ambulatory Visit: Payer: Self-pay

## 2017-10-24 ENCOUNTER — Encounter (HOSPITAL_COMMUNITY): Payer: Self-pay | Admitting: *Deleted

## 2017-10-24 ENCOUNTER — Ambulatory Visit (HOSPITAL_COMMUNITY)
Admission: RE | Admit: 2017-10-24 | Discharge: 2017-10-24 | Disposition: A | Discharge status: Home / Self Care | Payer: Medicare Other | Source: Ambulatory Visit | Attending: Internal Medicine | Admitting: Internal Medicine

## 2017-10-24 ENCOUNTER — Encounter (HOSPITAL_COMMUNITY)
Admission: RE | Disposition: A | Discharge status: Home / Self Care | Payer: Self-pay | Source: Ambulatory Visit | Attending: Internal Medicine

## 2017-10-24 ENCOUNTER — Ambulatory Visit (HOSPITAL_COMMUNITY): Payer: Medicare Other | Admitting: Anesthesiology

## 2017-10-24 DIAGNOSIS — E785 Hyperlipidemia, unspecified: Secondary | ICD-10-CM | POA: Diagnosis not present

## 2017-10-24 DIAGNOSIS — Z7982 Long term (current) use of aspirin: Secondary | ICD-10-CM | POA: Diagnosis not present

## 2017-10-24 DIAGNOSIS — I2510 Atherosclerotic heart disease of native coronary artery without angina pectoris: Secondary | ICD-10-CM | POA: Diagnosis not present

## 2017-10-24 DIAGNOSIS — Z87891 Personal history of nicotine dependence: Secondary | ICD-10-CM | POA: Diagnosis not present

## 2017-10-24 DIAGNOSIS — Z951 Presence of aortocoronary bypass graft: Secondary | ICD-10-CM | POA: Insufficient documentation

## 2017-10-24 DIAGNOSIS — K208 Other esophagitis: Secondary | ICD-10-CM | POA: Diagnosis not present

## 2017-10-24 DIAGNOSIS — K228 Other specified diseases of esophagus: Secondary | ICD-10-CM

## 2017-10-24 DIAGNOSIS — D125 Benign neoplasm of sigmoid colon: Secondary | ICD-10-CM

## 2017-10-24 DIAGNOSIS — R1013 Epigastric pain: Secondary | ICD-10-CM

## 2017-10-24 DIAGNOSIS — Z9049 Acquired absence of other specified parts of digestive tract: Secondary | ICD-10-CM | POA: Diagnosis not present

## 2017-10-24 DIAGNOSIS — K209 Esophagitis, unspecified: Secondary | ICD-10-CM | POA: Diagnosis not present

## 2017-10-24 DIAGNOSIS — R1310 Dysphagia, unspecified: Secondary | ICD-10-CM | POA: Diagnosis not present

## 2017-10-24 DIAGNOSIS — J449 Chronic obstructive pulmonary disease, unspecified: Secondary | ICD-10-CM | POA: Insufficient documentation

## 2017-10-24 DIAGNOSIS — K5730 Diverticulosis of large intestine without perforation or abscess without bleeding: Secondary | ICD-10-CM | POA: Diagnosis not present

## 2017-10-24 DIAGNOSIS — K219 Gastro-esophageal reflux disease without esophagitis: Secondary | ICD-10-CM | POA: Diagnosis not present

## 2017-10-24 DIAGNOSIS — Z8501 Personal history of malignant neoplasm of esophagus: Secondary | ICD-10-CM | POA: Diagnosis not present

## 2017-10-24 DIAGNOSIS — K2270 Barrett's esophagus without dysplasia: Secondary | ICD-10-CM | POA: Insufficient documentation

## 2017-10-24 DIAGNOSIS — Z79899 Other long term (current) drug therapy: Secondary | ICD-10-CM | POA: Insufficient documentation

## 2017-10-24 DIAGNOSIS — Z86010 Personal history of colonic polyps: Secondary | ICD-10-CM

## 2017-10-24 DIAGNOSIS — Z1211 Encounter for screening for malignant neoplasm of colon: Secondary | ICD-10-CM | POA: Insufficient documentation

## 2017-10-24 DIAGNOSIS — I10 Essential (primary) hypertension: Secondary | ICD-10-CM | POA: Diagnosis not present

## 2017-10-24 DIAGNOSIS — D120 Benign neoplasm of cecum: Secondary | ICD-10-CM | POA: Insufficient documentation

## 2017-10-24 DIAGNOSIS — E1151 Type 2 diabetes mellitus with diabetic peripheral angiopathy without gangrene: Secondary | ICD-10-CM | POA: Insufficient documentation

## 2017-10-24 DIAGNOSIS — Z8249 Family history of ischemic heart disease and other diseases of the circulatory system: Secondary | ICD-10-CM | POA: Diagnosis not present

## 2017-10-24 DIAGNOSIS — Z888 Allergy status to other drugs, medicaments and biological substances status: Secondary | ICD-10-CM | POA: Diagnosis not present

## 2017-10-24 HISTORY — PX: ESOPHAGOGASTRODUODENOSCOPY (EGD) WITH PROPOFOL: SHX5813

## 2017-10-24 HISTORY — PX: COLONOSCOPY WITH PROPOFOL: SHX5780

## 2017-10-24 HISTORY — PX: BIOPSY: SHX5522

## 2017-10-24 HISTORY — PX: POLYPECTOMY: SHX5525

## 2017-10-24 SURGERY — COLONOSCOPY WITH PROPOFOL
Anesthesia: General

## 2017-10-24 MED ORDER — LACTATED RINGERS IV SOLN
INTRAVENOUS | Status: DC | PRN
  Administered 2017-10-24: 10:00:00 via INTRAVENOUS

## 2017-10-24 MED ORDER — PHENYLEPHRINE 40 MCG/ML (10ML) SYRINGE FOR IV PUSH (FOR BLOOD PRESSURE SUPPORT)
PREFILLED_SYRINGE | INTRAVENOUS | Status: AC
  Filled 2017-10-24: qty 10

## 2017-10-24 MED ORDER — PROPOFOL 10 MG/ML IV BOLUS
INTRAVENOUS | Status: AC
  Filled 2017-10-24: qty 20

## 2017-10-24 MED ORDER — LIDOCAINE VISCOUS 2 % MT SOLN
OROMUCOSAL | Status: AC
  Filled 2017-10-24: qty 15

## 2017-10-24 MED ORDER — PROPOFOL 10 MG/ML IV BOLUS
INTRAVENOUS | Status: DC | PRN
  Administered 2017-10-24 (×2): 20 mg via INTRAVENOUS

## 2017-10-24 MED ORDER — LIDOCAINE HCL (CARDIAC) 20 MG/ML IV SOLN
INTRAVENOUS | Status: DC | PRN
  Administered 2017-10-24: 30 mg via INTRAVENOUS

## 2017-10-24 MED ORDER — LIDOCAINE VISCOUS 2 % MT SOLN
5.0000 mL | Freq: Once | OROMUCOSAL | Status: AC
  Administered 2017-10-24: 5 mL via OROMUCOSAL

## 2017-10-24 MED ORDER — PHENYLEPHRINE HCL 10 MG/ML IJ SOLN
INTRAMUSCULAR | Status: DC | PRN
  Administered 2017-10-24 (×4): 40 ug via INTRAVENOUS

## 2017-10-24 MED ORDER — PROPOFOL 500 MG/50ML IV EMUL
INTRAVENOUS | Status: DC | PRN
  Administered 2017-10-24: 100 ug/kg/min via INTRAVENOUS

## 2017-10-24 NOTE — Addendum Note (Signed)
Addendum  created 10/24/17 1806 by Vista Deck, CRNA   Charge Capture section accepted

## 2017-10-24 NOTE — Op Note (Signed)
[facility] Patient Name: Gregory Eaton Procedure Date: 10/24/2017 10:44 AM MRN: [id number] Date of Birth: 1946-03-17 Attending MD: Norvel Richards , MD CSN: [id number] Age: 72 Admit Type: Outpatient Procedure:                Colonoscopy Indications:              High risk colon cancer surveillance: Personal                            history of colonic polyps Providers:                Norvel Richards, MD, Otis Peak B. Sharon Seller, RN,                            Nelma Rothman, Technician Referring MD:              Medicines:                Propofol per Anesthesia Complications:            No immediate complications. Estimated Blood Loss:     Estimated blood loss: none. Procedure:                Pre-Anesthesia Assessment:                           - Prior to the procedure, a History and Physical                            was performed, and patient medications and                            allergies were reviewed. The patient's tolerance of                            previous anesthesia was also reviewed. The risks                            and benefits of the procedure and the sedation                            options and risks were discussed with the patient.                            All questions were answered, and informed consent                            was obtained. Prior Anticoagulants: The patient                            last took Plavix (clopidogrel) 2 days prior to the                            procedure. ASA Grade Assessment: III - A patient  with severe systemic disease. After reviewing the                            risks and benefits, the patient was deemed in                            satisfactory condition to undergo the procedure.                           After obtaining informed consent, the colonoscope                            was passed under direct vision. Throughout the                            procedure, the  patient's blood pressure, pulse, and                            oxygen saturations were monitored continuously. The                            [id number] ([id number]) scope was introduced through                            the anus and advanced to the the cecum, identified                            by appendiceal orifice and ileocecal valve. The                            colonoscopy was performed without difficulty. The                            patient tolerated the procedure well. The ileocecal                            valve, appendiceal orifice, and rectum were                            photographed. The entire colon was well visualized.                            The quality of the bowel preparation was adequate. Scope In: 10:51:50 AM Scope Out: 11:07:26 AM Scope Withdrawal Time: 0 hours 6 minutes 14 seconds  Total Procedure Duration: 0 hours 15 minutes 36 seconds  Findings:      The perianal and digital rectal examinations were normal.      Multiple large-mouthed diverticula were found in the sigmoid colon,       descending colon and transverse colon.      A 11 mm polyp was found in the ileocecal valve. The polyp was sessile.       The polyp was removed with a hot snare. Resection and retrieval were       complete. Estimated blood loss: none.  The exam was otherwise without abnormality on direct and retroflexion       views. Impression:               - Diverticulosis in the sigmoid colon, in the                            descending colon and in the transverse colon.                           - One 11 mm polyp at the ileocecal valve, removed                            with a hot snare. Resected and retrieved.                           - The examination was otherwise normal on direct                            and retroflexion views. Moderate Sedation:      Moderate (conscious) sedation was personally administered by an       anesthesia professional. The following parameters  were monitored: oxygen       saturation, heart rate, blood pressure, respiratory rate, EKG, adequacy       of pulmonary ventilation, and response to care. Total physician       intraservice time was 51 minutes. Recommendation:           - Patient has a contact number available for                            emergencies. The signs and symptoms of potential                            delayed complications were discussed with the                            patient. Return to normal activities tomorrow.                            Written discharge instructions were provided to the                            patient.                           - Resume previous diet.                           - Continue present medications.                           - Repeat colonoscopy date to be determined after                            pending pathology results are reviewed for  surveillance based on pathology results.                           - Return to GI office in 3 months. Procedure Code(s):        --- Professional ---                           [phone], Colonoscopy, flexible; with removal of                            tumor(s), polyp(s), or other lesion(s) by snare                            technique Diagnosis Code(s):        --- Professional ---                           Z86.010, Personal history of colonic polyps                           D12.0, Benign neoplasm of cecum                           K57.30, Diverticulosis of large intestine without                            perforation or abscess without bleeding CPT copyright 2017 [employer]. All rights reserved. The codes documented in this report are preliminary and upon coder review may  be revised to meet current compliance requirements. Cristopher Estimable. Louine Tenpenny, MD Norvel Richards, MD 10/24/2017 11:11:11 AM This report has been signed electronically. Number of Addenda: 0

## 2017-10-24 NOTE — Interval H&P Note (Signed)
History and Physical Interval Note:  10/24/2017 10:04 AM  Gregory Eaton  has presented today for surgery, with the diagnosis of h/o colon polyps, dysphagia, epigatsric pain  The various methods of treatment have been discussed with the patient and family. After consideration of risks, benefits and other options for treatment, the patient has consented to  Procedure(s) with comments: COLONOSCOPY WITH PROPOFOL (N/A) - 1:00pm ESOPHAGOGASTRODUODENOSCOPY (EGD) WITH PROPOFOL (N/A) MALONEY DILATION (N/A) as a surgical intervention .  The patient's history has been reviewed, patient examined, no change in status, stable for surgery.  I have reviewed the patient's chart and labs.  Questions were answered to the patient's satisfaction.     Gregory Eaton  Denies dysphagia. Epigastric discomfort much better since she was switched from Protonix to omeprazole 40 mg twice daily. Otherwise no change. EGD and colonoscopy per plan.  The risks, benefits, limitations, imponderables and alternatives regarding both EGD and colonoscopy have been reviewed with the patient. Questions have been answered. All parties agreeable.

## 2017-10-24 NOTE — Anesthesia Postprocedure Evaluation (Signed)
Anesthesia Post Note  Patient: Gregory Eaton  Procedure(s) Performed: COLONOSCOPY WITH PROPOFOL (N/A ) ESOPHAGOGASTRODUODENOSCOPY (EGD) WITH PROPOFOL (N/A ) BIOPSY POLYPECTOMY  Patient location during evaluation: Short Stay Anesthesia Type: General Level of consciousness: awake and alert and patient cooperative Pain management: satisfactory to patient Respiratory status: spontaneous breathing Cardiovascular status: stable Postop Assessment: no apparent nausea or vomiting Anesthetic complications: no     Last Vitals:  Vitals:   10/24/17 1140 10/24/17 1151  BP: (!) 171/78 (!) 166/88  Pulse: 66   Resp: 11   Temp:    SpO2: 97%     Last Pain:  Vitals:   10/24/17 1130  PainSc: 0-No pain                 Basya Casavant

## 2017-10-24 NOTE — Anesthesia Preprocedure Evaluation (Signed)
Anesthesia Evaluation  Patient identified by MRN, date of birth, ID band Patient awake    Reviewed: Allergy & Precautions, H&P , NPO status , Patient's Chart, lab work & pertinent test results  Airway Mallampati: I  TM Distance: >3 FB Neck ROM: full    Dental no notable dental hx. (+) Implants, Dental Advidsory Given   Pulmonary neg pulmonary ROS, COPD,  COPD inhaler, former smoker,    Pulmonary exam normal breath sounds clear to auscultation + decreased breath sounds      Cardiovascular Exercise Tolerance: Good hypertension, On Medications + CAD, + CABG, + Peripheral Vascular Disease and + PND  negative cardio ROS   Rhythm:regular Rate:Normal     Neuro/Psych negative neurological ROS  negative psych ROS   GI/Hepatic negative GI ROS, Neg liver ROS, GERD  ,  Endo/Other  negative endocrine ROSdiabetes, Type 2  Renal/GU negative Renal ROS  negative genitourinary   Musculoskeletal   Abdominal   Peds  Hematology negative hematology ROS (+)   Anesthesia Other Findings   Reproductive/Obstetrics negative OB ROS                             Anesthesia Physical Anesthesia Plan  ASA: II  Anesthesia Plan: General   Post-op Pain Management:    Induction:   PONV Risk Score and Plan:   Airway Management Planned:   Additional Equipment:   Intra-op Plan:   Post-operative Plan:   Informed Consent: I have reviewed the patients History and Physical, chart, labs and discussed the procedure including the risks, benefits and alternatives for the proposed anesthesia with the patient or authorized representative who has indicated his/her understanding and acceptance.   Dental Advisory Given  Plan Discussed with: CRNA  Anesthesia Plan Comments:         Anesthesia Quick Evaluation

## 2017-10-24 NOTE — Transfer of Care (Signed)
Immediate Anesthesia Transfer of Care Note  Patient: PIPER ALBRO  Procedure(s) Performed: COLONOSCOPY WITH PROPOFOL (N/A ) ESOPHAGOGASTRODUODENOSCOPY (EGD) WITH PROPOFOL (N/A ) BIOPSY POLYPECTOMY  Patient Location: PACU  Anesthesia Type:MAC  Level of Consciousness: awake, alert , oriented and patient cooperative  Airway & Oxygen Therapy: Patient Spontanous Breathing and Patient connected to nasal cannula oxygen  Post-op Assessment: Report given to RN, Post -op Vital signs reviewed and stable and Patient moving all extremities X 4  Post vital signs: Reviewed and stable  Last Vitals:  Vitals Value Taken Time  BP    Temp    Pulse    Resp    SpO2      Last Pain:  Vitals:   10/24/17 1016  PainSc: 0-No pain         Complications: No apparent anesthesia complications

## 2017-10-24 NOTE — Op Note (Signed)
[facility] Patient Name: Gregory Eaton Procedure Date: 10/24/2017 9:47 AM MRN: [id number] Date of Birth: Jul 02, 1946 Attending MD: Norvel Richards , MD CSN: [id number] Age: 72 Admit Type: Outpatient Procedure:                Upper GI endoscopy Indications:              Epigastric abdominal pain Providers:                Norvel Richards, MD, Otis Peak B. Sharon Seller, RN,                            Nelma Rothman, Technician Referring MD:              Medicines:                Propofol per Anesthesia Complications:            No immediate complications. Estimated Blood Loss:     Estimated blood loss was minimal. Procedure:                After obtaining informed consent, the endoscope was                            passed under direct vision. Throughout the                            procedure, the patient's blood pressure, pulse, and                            oxygen saturations were monitored continuously. The                            [id number]) scope was introduced through the                            mouth, and advanced to the second part of duodenum.                            The upper GI endoscopy was accomplished without                            difficulty. The upper GI endoscopy was accomplished                            without difficulty. The patient tolerated the                            procedure well. Scope In: 10:33:58 AM Scope Out: 10:44:14 AM Total Procedure Duration: 0 hours 10 minutes 16 seconds  Findings:      Status post esophagectomy with remnant esophagus; anastomosis with       stomach at 24 cm from the incisors. Circumferential salmon colored       epithelium coming up 2 cm above the anastomosis suspicious for Barrett's       epithelium. No nodularity. No esophagitis. Residual stomach appeared       normal. Patent pylorus. Normal-appearing first and second portion  of the       duodenum      The duodenal bulb was normal. The first portion  of the duodenum and       second portion of the duodenum were normal.      Salmon-colored mucosa was present. This was biopsied with a cold forceps       for evaluation to rule out Barrett's Esophagus. Impression:               -Mucosal changes in the remnant esophagus -                            Suspicious for Barrett's esophagus.. Biopsied.                           -Normal Stomach.                           -Normal duodenal bulb and second portion of the                            duodenum. Status post esophagectomy with gastric                            pull-up.                           - Salmon-colored mucosa suspicious for Barrett's                            esophagus. Biopsied. Moderate Sedation:      Moderate (conscious) sedation was personally administered by an       anesthesia professional. The following parameters were monitored: oxygen       saturation, heart rate, blood pressure, and response to care. Total       physician intraservice time was 32 minutes. Recommendation:           - Written discharge instructions were provided to                            the patient.                           - The signs and symptoms of potential delayed                            complications were discussed with the patient.                           - Patient has a contact number available for                            emergencies.                           - Return to normal activities tomorrow.                           - Resume  previous diet.                           - Continue present medications. PCP recently                            changed PPI to omeprazole 40mg  twice daily. This                            has been associated with near resolution of his                            presenting epigastric pain.                           - Resume Plavix (clopidogrel) today.                           - Repeat upper endoscopy (date not yet determined)                             for surveillance based on pathology results.                           - Return to GI clinic (date not yet determined). Procedure Code(s):        --- Professional ---                           [phone], Esophagogastroduodenoscopy, flexible,                            transoral; with biopsy, single or multiple Diagnosis Code(s):        --- Professional ---                           K22.8, Other specified diseases of esophagus                           R10.13, Epigastric pain CPT copyright 2017 American Medical Association. All rights reserved. The codes documented in this report are preliminary and upon coder review may  be revised to meet current compliance requirements. Cristopher Estimable. Jaegar Croft, MD Norvel Richards, MD 10/24/2017 12:27:22 PM This report has been signed electronically. Number of Addenda: 0

## 2017-10-24 NOTE — Discharge Instructions (Signed)
Colonoscopy Discharge Instructions  Read the instructions outlined below and refer to this sheet in the next few weeks. These discharge instructions provide you with general information on caring for yourself after you leave the hospital. Your doctor may also give you specific instructions. While your treatment has been planned according to the most current medical practices available, unavoidable complications occasionally occur. If you have any problems or questions after discharge, call Dr. [name] at [phone]. ACTIVITY  You may resume your regular activity, but move at a slower pace for the next 24 hours.   Take frequent rest periods for the next 24 hours.   Walking will help get rid of the air and reduce the bloated feeling in your belly (abdomen).   No driving for 24 hours (because of the medicine (anesthesia) used during the test).    Do not sign any important legal documents or operate any machinery for 24 hours (because of the anesthesia used during the test).  NUTRITION  Drink plenty of fluids.   You may resume your normal diet as instructed by your doctor.   Begin with a light meal and progress to your normal diet. Heavy or fried foods are harder to digest and may make you feel sick to your stomach (nauseated).   Avoid alcoholic beverages for 24 hours or as instructed.  MEDICATIONS  You may resume your normal medications unless your doctor tells you otherwise.  WHAT YOU CAN EXPECT TODAY  Some feelings of bloating in the abdomen.   Passage of more gas than usual.   Spotting of blood in your stool or on the toilet paper.  IF YOU HAD POLYPS REMOVED DURING THE COLONOSCOPY:  No aspirin products for 7 days or as instructed.   No alcohol for 7 days or as instructed.   Eat a soft diet for the next 24 hours.  FINDING OUT THE RESULTS OF YOUR TEST Not all test results are available during your visit. If your test results are not back during the visit, make an appointment  with your caregiver to find out the results. Do not assume everything is normal if you have not heard from your caregiver or the medical facility. It is important for you to follow up on all of your test results.  SEEK IMMEDIATE MEDICAL ATTENTION IF:  You have more than a spotting of blood in your stool.   Your belly is swollen (abdominal distention).   You are nauseated or vomiting.   You have a temperature over 101.   You have abdominal pain or discomfort that is severe or gets worse throughout the day.    Polyp and diverticulosis information provided  Further recommendations to follow pending review of pathology report   EGD Discharge instructions Please read the instructions outlined below and refer to this sheet in the next few weeks. These discharge instructions provide you with general information on caring for yourself after you leave the hospital. Your doctor may also give you specific instructions. While your treatment has been planned according to the most current medical practices available, unavoidable complications occasionally occur. If you have any problems or questions after discharge, please call your doctor. ACTIVITY  You may resume your regular activity but move at a slower pace for the next 24 hours.   Take frequent rest periods for the next 24 hours.   Walking will help expel (get rid of) the air and reduce the bloated feeling in your abdomen.   No driving for 24 hours (because of  the anesthesia (medicine) used during the test).   You may shower.   Do not sign any important legal documents or operate any machinery for 24 hours (because of the anesthesia used during the test).  NUTRITION  Drink plenty of fluids.   You may resume your normal diet.   Begin with a light meal and progress to your normal diet.   Avoid alcoholic beverages for 24 hours or as instructed by your caregiver.  MEDICATIONS  You may resume your normal medications unless your  caregiver tells you otherwise.  WHAT YOU CAN EXPECT TODAY  You may experience abdominal discomfort such as a feeling of fullness or gas pains.  FOLLOW-UP  Your doctor will discuss the results of your test with you.  SEEK IMMEDIATE MEDICAL ATTENTION IF ANY OF THE FOLLOWING OCCUR:  Excessive nausea (feeling sick to your stomach) and/or vomiting.   Severe abdominal pain and distention (swelling).   Trouble swallowing.   Temperature over 101 F (37.8 C).   Rectal bleeding or vomiting of blood.    GERD information provided  Continue omeprazole 40 mg twice daily  Office visit with [place] in 3 months  July 12 th at 9:00 am  Further recommendations to follow pending review of pathology report  Gastroesophageal Reflux Disease, Adult Normally, food travels down the esophagus and stays in the stomach to be digested. However, when a person has gastroesophageal reflux disease (GERD), food and stomach acid move back up into the esophagus. When this happens, the esophagus becomes sore and inflamed. Over time, GERD can create small holes (ulcers) in the lining of the esophagus. What are the causes? This condition is caused by a problem with the muscle between the esophagus and the stomach (lower esophageal sphincter, or LES). Normally, the LES muscle closes after food passes through the esophagus to the stomach. When the LES is weakened or abnormal, it does not close properly, and that allows food and stomach acid to go back up into the esophagus. The LES can be weakened by certain dietary substances, medicines, and medical conditions, including:  Tobacco use.  Pregnancy.  Having a hiatal hernia.  Heavy alcohol use.  Certain foods and beverages, such as coffee, chocolate, onions, and peppermint.  What increases the risk? This condition is more likely to develop in:  People who have an increased body weight.  People who have connective tissue disorders.  People who use NSAID  medicines.  What are the signs or symptoms? Symptoms of this condition include:  Heartburn.  Difficult or painful swallowing.  The feeling of having a lump in the throat.  Abitter taste in the mouth.  Bad breath.  Having a large amount of saliva.  Having an upset or bloated stomach.  Belching.  Chest pain.  Shortness of breath or wheezing.  Ongoing (chronic) cough or a night-time cough.  Wearing away of tooth enamel.  Weight loss.  Different conditions can cause chest pain. Make sure to see your health care provider if you experience chest pain. How is this diagnosed? Your health care provider will take a medical history and perform a physical exam. To determine if you have mild or severe GERD, your health care provider may also monitor how you respond to treatment. You may also have other tests, including:  An endoscopy toexamine your stomach and esophagus with a small camera.  A test thatmeasures the acidity level in your esophagus.  A test thatmeasures how much pressure is on your esophagus.  A barium swallow  or modified barium swallow to show the shape, size, and functioning of your esophagus.  How is this treated? The goal of treatment is to help relieve your symptoms and to prevent complications. Treatment for this condition may vary depending on how severe your symptoms are. Your health care provider may recommend:  Changes to your diet.  Medicine.  Surgery.  Follow these instructions at home: Diet  Follow a diet as recommended by your health care provider. This may involve avoiding foods and drinks such as: ? Coffee and tea (with or without caffeine). ? Drinks that containalcohol. ? Energy drinks and sports drinks. ? Carbonated drinks or sodas. ? Chocolate and cocoa. ? Peppermint and mint flavorings. ? Garlic and onions. ? Horseradish. ? Spicy and acidic foods, including peppers, chili powder, curry powder, vinegar, hot sauces, and barbecue  sauce. ? Citrus fruit juices and citrus fruits, such as oranges, lemons, and limes. ? Tomato-based foods, such as red sauce, chili, salsa, and pizza with red sauce. ? Fried and fatty foods, such as donuts, french fries, potato chips, and high-fat dressings. ? High-fat meats, such as hot dogs and fatty cuts of red and white meats, such as rib eye steak, sausage, ham, and bacon. ? High-fat dairy items, such as whole milk, butter, and cream cheese.  Eat small, frequent meals instead of large meals.  Avoid drinking large amounts of liquid with your meals.  Avoid eating meals during the 2-3 hours before bedtime.  Avoid lying down right after you eat.  Do not exercise right after you eat. General instructions  Pay attention to any changes in your symptoms.  Take over-the-counter and prescription medicines only as told by your health care provider. Do not take aspirin, ibuprofen, or other NSAIDs unless your health care provider told you to do so.  Do not use any tobacco products, including cigarettes, chewing tobacco, and e-cigarettes. If you need help quitting, ask your health care provider.  Wear loose-fitting clothing. Do not wear anything tight around your waist that causes pressure on your abdomen.  Raise (elevate) the head of your bed 6 inches (15cm).  Try to reduce your stress, such as with yoga or meditation. If you need help reducing stress, ask your health care provider.  If you are overweight, reduce your weight to an amount that is healthy for you. Ask your health care provider for guidance about a safe weight loss goal.  Keep all follow-up visits as told by your health care provider. This is important. Contact a health care provider if:  You have new symptoms.  You have unexplained weight loss.  You have difficulty swallowing, or it hurts to swallow.  You have wheezing or a persistent cough.  Your symptoms do not improve with treatment.  You have a hoarse  voice. Get help right away if:  You have pain in your arms, neck, jaw, teeth, or back.  You feel sweaty, dizzy, or light-headed.  You have chest pain or shortness of breath.  You vomit and your vomit looks like blood or coffee grounds.  You faint.  Your stool is bloody or black.  You cannot swallow, drink, or eat. This information is not intended to replace advice given to you by your health care provider. Make sure you discuss any questions you have with your health care provider. Document Released: 04/14/2005 Document Revised: 12/03/2015 Document Reviewed: 10/30/2014 Elsevier Interactive Patient Education  2018 [employer].   Diverticulosis Diverticulosis is a condition that develops when small pouches (diverticula)  form in the wall of the large intestine (colon). The colon is where water is absorbed and stool is formed. The pouches form when the inside layer of the colon pushes through weak spots in the outer layers of the colon. You may have a few pouches or many of them. What are the causes? The cause of this condition is not known. What increases the risk? The following factors may make you more likely to develop this condition:  Being older than age 51. Your risk for this condition increases with age. Diverticulosis is rare among people younger than age 73. By age 63, many people have it.  Eating a low-fiber diet.  Having frequent constipation.  Being overweight.  Not getting enough exercise.  Smoking.  Taking over-the-counter pain medicines, like aspirin and ibuprofen.  Having a family history of diverticulosis.  What are the signs or symptoms? In most people, there are no symptoms of this condition. If you do have symptoms, they may include:  Bloating.  Cramps in the abdomen.  Constipation or diarrhea.  Pain in the lower left side of the abdomen.  How is this diagnosed? This condition is most often diagnosed during an exam for other colon problems.  Because diverticulosis usually has no symptoms, it often cannot be diagnosed independently. This condition may be diagnosed by:  Using a flexible scope to examine the colon (colonoscopy).  Taking an X-ray of the colon after dye has been put into the colon (barium enema).  Doing a CT scan.  How is this treated? You may not need treatment for this condition if you have never developed an infection related to diverticulosis. If you have had an infection before, treatment may include:  Eating a high-fiber diet. This may include eating more fruits, vegetables, and grains.  Taking a fiber supplement.  Taking a live bacteria supplement (probiotic).  Taking medicine to relax your colon.  Taking antibiotic medicines.  Follow these instructions at home:  Drink 6-8 glasses of water or more each day to prevent constipation.  Try not to strain when you have a bowel movement.  If you have had an infection before: ? Eat more fiber as directed by your health care provider or your diet and nutrition specialist (dietitian). ? Take a fiber supplement or probiotic, if your health care provider approves.  Take over-the-counter and prescription medicines only as told by your health care provider.  If you were prescribed an antibiotic, take it as told by your health care provider. Do not stop taking the antibiotic even if you start to feel better.  Keep all follow-up visits as told by your health care provider. This is important. Contact a health care provider if:  You have pain in your abdomen.  You have bloating.  You have cramps.  You have not had a bowel movement in 3 days. Get help right away if:  Your pain gets worse.  Your bloating becomes very bad.  You have a fever or chills, and your symptoms suddenly get worse.  You vomit.  You have bowel movements that are bloody or black.  You have bleeding from your rectum. Summary  Diverticulosis is a condition that develops when  small pouches (diverticula) form in the wall of the large intestine (colon).  You may have a few pouches or many of them.  This condition is most often diagnosed during an exam for other colon problems.  If you have had an infection related to diverticulosis, treatment may include increasing the fiber  in your diet, taking supplements, or taking medicines. This information is not intended to replace advice given to you by your health care provider. Make sure you discuss any questions you have with your health care provider. Document Released: 04/01/2004 Document Revised: 05/24/2016 Document Reviewed: 05/24/2016 Elsevier Interactive Patient Education  2017 [employer].

## 2017-10-28 ENCOUNTER — Encounter (HOSPITAL_COMMUNITY): Payer: Self-pay | Admitting: Internal Medicine

## 2017-10-30 ENCOUNTER — Encounter: Payer: Self-pay | Admitting: Internal Medicine

## 2017-11-01 ENCOUNTER — Telehealth: Payer: Self-pay | Admitting: Internal Medicine

## 2017-11-01 NOTE — Telephone Encounter (Signed)
Pt called to discuss the pain he has in his abdomen. He was also given his results from his procedure he recently had. Pt notices pain only when laying down at night. The pain can also wake him up in the middle of the night. Pt report no nausea, no burning sensations. Pt does state that he burps a lot. Pt takes his reflux medication Omeprazole as directed.

## 2017-11-01 NOTE — Telephone Encounter (Signed)
Pt had procedure recently and has a 3 month follow up scheduled for July. Patient said he is still having problems and not feeling any better. He asked for the nurse to call him back. [phone]

## 2017-11-01 NOTE — Telephone Encounter (Signed)
Given his history of esophageal cancer and his symptoms, he needs a contrast CT of the chest abdomen and pelvis in the near future to further evaluate these symptoms.

## 2017-11-02 ENCOUNTER — Other Ambulatory Visit: Payer: Self-pay

## 2017-11-02 DIAGNOSIS — C159 Malignant neoplasm of esophagus, unspecified: Secondary | ICD-10-CM

## 2017-11-02 DIAGNOSIS — R109 Unspecified abdominal pain: Secondary | ICD-10-CM

## 2017-11-02 NOTE — Telephone Encounter (Signed)
No PA needed for CT chest, abd/pelivs per [facility] website.

## 2017-11-02 NOTE — Telephone Encounter (Signed)
Pt needs a CT of abdomen, chest, pelvis per RMR.

## 2017-11-02 NOTE — Telephone Encounter (Signed)
Called and informed pt of RMR's recommendation. Ok to schedule CT chest, abd/pelvis. He will be out of town starting 11/14/17 and return 11/21/17. CT's scheduled for 11/22/17 at 8:00am, arrive at 7:45am. NPO 4 hours prior to test, pickup contrast before day of test. Called and informed pt of appt and details. Letter mailed.

## 2017-11-07 ENCOUNTER — Encounter: Payer: Self-pay | Admitting: Pulmonary Disease

## 2017-11-07 ENCOUNTER — Ambulatory Visit: Payer: Medicare Other | Admitting: Pulmonary Disease

## 2017-11-07 VITALS — BP 148/82 | HR 68 | Temp 97.7°F | Ht 68.0 in | Wt 161.2 lb

## 2017-11-07 DIAGNOSIS — R1310 Dysphagia, unspecified: Secondary | ICD-10-CM

## 2017-11-07 DIAGNOSIS — I679 Cerebrovascular disease, unspecified: Secondary | ICD-10-CM

## 2017-11-07 DIAGNOSIS — Z951 Presence of aortocoronary bypass graft: Secondary | ICD-10-CM | POA: Diagnosis not present

## 2017-11-07 DIAGNOSIS — I10 Essential (primary) hypertension: Secondary | ICD-10-CM | POA: Diagnosis not present

## 2017-11-07 DIAGNOSIS — J439 Emphysema, unspecified: Secondary | ICD-10-CM

## 2017-11-07 DIAGNOSIS — K219 Gastro-esophageal reflux disease without esophagitis: Secondary | ICD-10-CM

## 2017-11-07 DIAGNOSIS — I2510 Atherosclerotic heart disease of native coronary artery without angina pectoris: Secondary | ICD-10-CM | POA: Diagnosis not present

## 2017-11-07 DIAGNOSIS — R911 Solitary pulmonary nodule: Secondary | ICD-10-CM | POA: Diagnosis not present

## 2017-11-07 DIAGNOSIS — I739 Peripheral vascular disease, unspecified: Secondary | ICD-10-CM

## 2017-11-07 DIAGNOSIS — C159 Malignant neoplasm of esophagus, unspecified: Secondary | ICD-10-CM

## 2017-11-07 DIAGNOSIS — R1319 Other dysphagia: Secondary | ICD-10-CM

## 2017-11-07 NOTE — Patient Instructions (Signed)
Today we updated your med list in our EPIC system...    Continue your current medications the same...  We discussed your exercise program- stay as active as possible...  Call for any questions...  Let's plan a follow up visit in 53mo, sooner if needed for problems.Marland KitchenMarland Kitchen

## 2017-11-07 NOTE — Progress Notes (Signed)
Subjective:     Patient ID: Gregory Eaton, male   DOB: June 13, 1946, 72 y.o.   MRN: [id number]  HPI 72 y/o WM ex-smoker w/ COPD/ emphysema (GOLD Stage2-3), 7-28m RLL nodule on CT Chest, CAD- s/p CABG 1998 w/ known carotid & periph vasc dis (s/p Fem-Pop bypass), hx esoph cancer- [facility]   ~  January 30, 2015:  Initial pulmonary consult w/ SN>   89y/o WM, [place] due to COPD/ emphysema/ & 7-841mRLL nodule on CT Chest>      ChTRUEelates a history of sinus infection/ URI in April2016 & went to his PCP DrGolding, treated w/ antibiotic & Pred=> improved but didn't get over it so he was given a second antibiotic + Spiriva, Proair, Singulair => finally improved so he tells me he stopped the Spiriva "I'm better"...  He is an ex-smoker, starting in his teens, smoked for 40 yrs up to 2ppd at max for a 55-60 pack-year smoking history... 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     He has a hx HBP, HL, CAD, s/p CABG in 1998, known carotid art dis & ASPVD w/ prev Fem-Pop bypass> followed by DrKManuela Neptune & his 10/07/14 Epic note is reviewed; meds adjusted, see below...    [facility]... EXAM revealed Afeb, VSS, O2sat=95% on RA;  HEENT- neg, mallampati2;  Chest- decr BS bilat w/o w/r/r;  Heart- RR gr1/6 SEM w/o r/g;  Abd- soft, neg;  Ext- w/o c/c/e...  CT Angio Chest 04/07/14 showed neg for PE, s/p CABG, severe atherosclerosis in Ao, severe bilat emphysema, 8mm48mdule RLL, inflamm changes in lingula, post surg changes related to esophagectomy & gastric pull thru, no signif LNs...  EKG 09/2014 showed NSR, rate64, RBBB, NAD...  [facility]  [facility]D/emphysema, unchanged 7mm 42m nodule, no new lesions, post-op changes after esophagectomy & gastric pull thru...  CXR 12/11/14 showed norm heart size, s/p CABG, COPD/ emphysema, NAD...   Spirometry 01/30/15 showed FVC=3.45 (82%), FEV1=1.76 (54%), %1sec=51, mid-flows reduced at 23% predicted;  Mod severe airflow obstruction w/ GOLD Stage 2-3 COPD....   Ambulatory oxygen saturation test 01/30/15> O2sat=96% on RA at rest w/ pulse=67/min;  He ambulated 3 laps in the office w/ nadir O2sat=93% w/ pulse=85/min...  LABS 7/16:  Alpha-1-Antitrypsin level ==> 133 (83-199 mg/dL) and phenotype= M1M2  IMP/PLAN>>  CharlWilliemod severe COPD/ emphysema based on his PFTs and heavy smoking hx; he would benefit from ICS/LABA and LAMA Rx=> rec to use SYMBICORT160-2spBid & SPIRIVA daily; he will continue PROAIR-HFA for acute symptoms prn, SINGULAIR10, and MUCINEX 600mgQ29mor thick phlegm/ mucus; he needs to maintain a vigorous antireflux regimen w/ his esoph dis & surg; as much as poss he will avoid infections and needs to start a vigorous exercise program (eg- silver sneakers or similar)... Prognosis is guarded w/ his signif co-morbidities (ASHD, ASPVD, Hx esoph ca w/ surg 2010, etc);  He will ret in 34mo w/47molPFTs to check DLCO...    FullPFT 04/03/15 showed FVC=4.22 (100%), FEV1=2.16 (70%), %1sec=51, mid-flows reduced at 36% predicted; post-bronchodil there was a 3% incr in FEV1; TLC=6.80 (100%),  RV=1.79 (75%), RV/TLC=26; DLCO=55% predicted... These PFTs indicate moderately severe airflow obstruction, GOLD Stage2-3 COPD, decr diffusion c/w emphysema...   CXR 12/02/15 by DrGolding>  Norm heart size s/p CABG, mild hyperinflation, coarse interstitial markings, small HH w/ A/F level...   LABS from [facility] 12/2015>  FLP- all parameters at goals;  Chems- ok w/ Cr=0.74, BS=88;  CBC- wnl...   Spirometry 03/23/16>  FVC=4.10 (101%), FEV1=1.76 (59%), %1sec=43, mid-flows reduced at 20% predicted... This is c/w a severe  obstructive ventilatory defect & GOLD Stage 2 CPD...   ~  May 12, 2016:  7wk ROV & add-on appt s/p Hosp in [place] for Pneumonia>  Hercules reports that he was vacationing at [place] w/ family when he became ill w/ chest congestion, SOB, fever to 101, chills, & weak w/ decr in BP; he went to the local [facility] & sent to [place] for 2d he says;  He had left lung pneumonia on CXR, WBC=14.8, Sput grew NTF only, BNP=119;  He was treated w/ Levaquin and slowly improved; [place] to recoup & now back home to [place] & feeling much better...    CXR 05/12/16>  Norm heart size, s/p CABG, hyperinflation w/ COPD, linear scarring in LUL & lingula, NAD...   IMP/PLAN>>  MrHall has made a nice recovery from his recent CAP 9/17 in [place]; he is off antibiotics and rec to continue his Symbicort160-2spBid, Spiriva daily, Singulair10, Mucinex 1241m Bid fluids, etc...  ~  July 27, 2016:  2-331moOV & ChCaylonas re-hospitalized 11/7 - 06/03/16 by [facility] w/ aspiration pneumonia & severe on-going reflux s/p esophageal [facility];  He was treated w/ IV Zosyn=> Augmentin, eval by speech Path, sent home w/ new home O2;  Since disch he has been seen by [facility]-DrKelly> HBP, CAD, s/pCABG1998, HL, [facility]/ oc[facility] left vertebral art, mild carotid dis, s/p fem-pop bypass & occlusive right SFA dis;  Stable on ASA/Plavix, ToprolXL, Amlod, Losar, Crestor;  He was also eval post hosp by TP> improved but seen again 07/13/16 w/ incr cough, thick mucus, & CXR showed improved aeration/ resolving pneumonia (rec same inhalers + incr ?Mucinex, fluids, & Pred taper)...   CXR 07/13/16>  S/p CABG & atherosclerotic changes in Ao, background COPD/E w/ hyperinflation & scarring, marked improvement in RUL/RLL areas...  LABS 05/2016>  Chems- ok w/ BS~110-160;  CBC- ok x WBC~10-15K;   IMP/PLAN>>   He is reminded to use his inhalers regularly & encouraged to use the NEBULIZER as needed;  Needs to incr the Mucinex6004mo 2Bidf w/ fluids;  O2sats are all in the 90s on RA- [facility];  Rec to continue his exercise program 7 call for any set backs in his status... we plan ROV in 31mo26mo  October 27, 2016:  31mo 40mo& Gregor reports a good interval- breathing is stable, no new complaints or concerns;  He notes min cough, scant intermit sput (clear), no hemoptysis, chr stable SOB/DOE w/ activities like rushing about, ok w/ ADLs, he's been painting a room, somewhat lim by LBP;  He denies any swallowing difficulty- eating well, maintaining wt, no reflux/ regurg/ dysphagia/ n/v etc...  We reviewed the following medical problems during today's office visit>     COPD/ emphysema ==> GOLD Stage 2-3 disease w/ severe bilat emphysema on CT &  normal A1AT level & phenotypeM1M2>  On Symbicort160- 2spBid, Spiriva daily, Singulair10, NEBS w/ Albut Qid prn & he hasn't needed this in months...    7-634m RLL pulmonary nodule on CT Chest>  No change noted from 2015=>[facility] ([facility]- [facility] felt [facility]) & serial CXRs w/o nodules seen (Last CXR 12/17 showed s/p CABG & atherosclerotic changes in Ao, background COPD/E w/ hyperinflation & scarring...    HBP>  [place] Top[place], Amlod5, Cozaar100; BP=140/72 & he denies CP, palpit, SOB, edema...    CAD- s/p 5 vessel CABG, RBBB>  On ASA/ Plavix; [facility] & [facility]/2016-     Carotid stenosis & occluded left vertebral>  Last CDopplers [place] plaque in bulbs and 0-39% R-ICA stenosis and 50-69% L-ICA stenosis; he has a bruit on the right side on exam...    ASPVD>  s/p right Fem-Pop in 1993;  Last LE arterial dopplers 06/2015 showed stable patent left Fem-Pop bypass graft with irregularities & three vessel run-off on the left; ABIs= 0.99-right & 1.1-left...    [facility]  [facility]- [facility]>  His GI is DrRourk, [facility] and [facility] but no CareEverywhere records are avail...    Other medical issues as noted>  See below... EXAM revealed Afeb, VSS, O2sat=96% on RA;  HEENT- neg, mallampati2;  Chest- decr BS bilat w/o w/r/r;  Heart- RR gr1/6 SEM w/o r/g;  Abd- soft, neg;  Ext- w/o c/c/e;  Neuro- intact... IMP/PLAN>>  CAkshithis stable w/ his severe cardiovasc & pulmonary issues as above;  He will continue the same meds for now &is encouraged to exercise as best he can;  He is up-to-date on vaccinations and he will call prn any problems w/ rov planned in 628mo.  ~  May 10, 2017:  6 [place] good interval, breathing well, rarely notes any wheezing, and min cough, no signif sput, SOB/DOE w/o change (ADLs ok, uses ex-bike, more lim by hip pain); denies f/c/s, no CP/ palpit/ edema; PCP= DrJohn Z Klinge & seen about 3-34m58moo he says;  On Advair250Bid, Spiriva daily, Singulair10, Albut NEB + HFA as needed... We reviewed the following medical problems during today's office visit>      COPD/ emphysema ==> GOLD Stage 2-3 disease w/ severe bilat emphysema on CT & normal A1AT level & phenotypeM1M2>  On Symbicort160- 2spBid, Spiriva daily, Singulair10, NEBS w/ Albut Qid prn & he hasn't needed this in months...    7-8mm71mL pulmonary nodule on CT Chest>  No change noted from 2015=>2016 ([facility]- they felt it was [facility]) & serial CXRs w/o nodules seen (Last CXR 12/17 showed s/p CABG & atherosclerotic changes in Ao, background COPD/E w/ hyperinflation & scarring...    HBP>  [place], Amlod5, Cozaar100; BP=140/72 & he denies CP, palpit, SOB, edema...    CAD- s/p 5 vessel CABG, RBBB>  On ASA/ Plavix; [facility] & last seen 01/2017 and note reviewed...    Carotid stenosis & occluded left vertebral>  [place] bulbs and 0-39% R-ICA stenosis and 50-69% L-ICA stenosis; he has a bruit on the right side on exam...    ASPVD>  s/p right Fem-Pop in 1993;  Last LE arterial dopplers 06/2015 showed stable patent left Fem-Pop bypass  graft with irregularities & three vessel run-off on the left; ABIs= 0.99-right & 1.1-left...    Hx esophageal cancer- s/p esophagectomy w/ [facility] in 2010>  His GI is DrRourk, [facility] and his surgery was [facility] in [facility] but no CareEverywhere records are avail...    Other medical issues as noted>  See below... EXAM revealed Afeb, VSS, O2sat=96% on RA;  HEENT- neg, mallampati2;  Chest- decr BS bilat w/o w/r/r;  Heart- RR gr1/6 SEM w/o r/g;  Abd- soft, neg;  Ext- w/o c/c/e;  Neuro- intact...  CXR 05/09/17 (independently reviewed by me in the PACS system) showed norm heart size, s/p CABG, underlying COPD, chr changes w/ scarring at left base- NAD...  IMP/PLAN>>  We reviewed Castulo' medical issues- rec to continue same rx + exercise; given 2018 Flu vaccine and we discussed ZPak to keep on hand for prn use; we plan rov recheck in 58mo sooner if needed for acute problems...    ~  November 07, 2017:  621moOV & Braidon reports a good interval & denies new complaints or concerns... He denies much cough, sputum, no hemoptysis, chr stable DOE if rushing about or on hills, exercises w/ yard work/ walking/ etc & denies CP/ palpit/ edema/ etc...    He had GI follow up w/ DrRourk, [facility] w/ EGD and Colonoscopy 10/24/17>  EGD showed s/p esophagectomy w/ remnant esoph, anastomosis w/ stomach at 24cm, 2cm segment of Barrett's epithelium but no dysplasia or malig on Bx;  Colon showed 1136molyp at the ileocecal valve (Bx=tub adenoma), divertics, no other lesions...  REC to continue PPI Bid... We reviewed the following medical problems during today's office visit>      COPD/ emphysema ==> GOLD Stage 2-3 disease w/ severe bilat emphysema on CT &  normal A1AT level & phenotypeM1M2>  On Symbicort160- 2spBid, Spiriva daily, Singulair10, NEBS w/ Albut Qid prn & he hasn't needed this in months...    7-8mm54mL pulmonary nodule on CT Chest>  No change noted from 2015=>[facility] ([facility]- they felt it was [facility]) & serial CXRs w/o nodules seen (Last CXR 10/18 showed s/p CABG & atherosclerotic changes in Ao, background COPD/E w/ hyperinflation & scarring- NAD...    HBP>  [place], Amlod5, Cozaar100; BP=140/80 & he denies CP, palpit, SOB, edema...    CAD- s/p 5 vessel CABG, RBBB>  On ASA/ Plavix; [facility] & last seen 01/2017 & note reviewed...     Carotid stenosis & occluded left vertebral>  Last CDopplers done 03/01/17 showed heterogen plaque bilat and 0-39% R-ICA stenosis and 40-59% L-ICA stenosis; patent right vertebral & occluded left vertebral...    ASPVD>  s/p right Fem-Pop in 1993;  Last LE arterial dopplers 11/11/16 showed stable patent left Fem-Pop bypass graft with irregularities & three vessel run-off on the left; ABIs= 0.93-right & non-compressible vessels on left; TBIs= .67R & .46L...    Hx esophageal cancer- [facility]>  His GI is DrRourk, [facility] and [facility] but no CareEverywhere records are avail;  ED[place] & [place])    Other medical issues as noted>  See below... EXAM revealed Afeb, VSS, O2sat=99% on RA;  HEENT- neg, mallampati2;  Chest- decr BS bilat w/o w/r/r;  Heart- RR gr1/6 SEM w/o r/g;  Abd- soft, neg;  Ext- w/o c/c/e;  Neuro- intact...Marland KitchenMarland Kitchen  LABS 10/2017 in Epic>  Chems- ok x BS=142;  CBC- wnl w/ Hg=14.6 IMP/PLAN>>  Kadarius is stable w/ his mult medical issues- from the pulmonary standpoint he remains stable on his Symbicort/ Spiriva regularly, and NEBS prn; no interval infections but he needs to remain diligent regarding his antireflux regimen s/p esoph surgery in 2010... We plan pulm  follow up visit in 9mo sooner if needed prn...    Past Medical History  Diagnosis Date  . Esophageal carcinoma 03/2009    T1N1M0  . Hypertension >> on Metoprolol, Amlodipine, Losartan   . Barrett's esophagus   . GERD (gastroesophageal reflux disease) >> on Protonix40 & Carafate 1gmQid   . CAD (coronary artery disease)   . PVD (peripheral vascular disease)   . Hyperlipidemia >> on Crestor10, Fish Oil + diet   . Adenomatous colon polyp 03/2009    Last colonoscopy by Dr. RGala Romney  . Diverticulosis   . Tobacco abuse   . Tachyarrhythmia 1999    [facility] COPD (chronic obstructive pulmonary disease)     Severe emphysema per CT  . History of echocardiogram 08/27/2009    EF >55%  . History of nuclear stress test 11/24/2011    lexiscan; normal perfusion; low risk scan; non-diagnostic for ischemia  . History of Doppler ultrasound 11/09/2011    03/2014- 50-69% L ICA stenosis;carotid doppler; L bulb/prox ICA 0-49% diameter reduction; L vertebral artery - occlusive ds; L ECA  demonstrates severe amount of fibrous plaque  . History of Doppler ultrasound 11/09/11    LEAs; R ABI - mod art. insuff.; L ABI normal at rest; R SFA - occlusive ds; L SFA - occlusive ds; patent fem-pop graft  . Adenomatous polyp of colon 11/03/2010  . Left carotid artery stenosis 04/08/2014  . Pulmonary nodule, right 04/08/2014    8 mm-incidental finding on CT  . DM type 2 (diabetes mellitus, type 2) >> on diet alone 04/09/2014    A1c 6.5 (04/08/14)     Past Surgical History:  Procedure Laterality Date  . BIOPSY  10/24/2017   Procedure: BIOPSY;  Surgeon: RDaneil Dolin MD;  Location: [facility];  Service: Endoscopy;;  esophagus  . COLONOSCOPY  03/17/2009   Dr.Rourk- normal rectum, sigmoid diverticula, some pale sigmoid mucosa with diffuse petechiae. pedunculated polyp at the splenic flexure, remainder of colonic mucosa appeared normal. bx= adenomatous polyp  . COLONOSCOPY  04/19/2003   Dr.Rehman- few  diverticu;a at the sigmoid colon, 3 small polyps, one at the transverse colon and 2 at the sigmoid, small external hemorrhoids. bx report not available.   . COLONOSCOPY WITH PROPOFOL N/A 10/24/2017   Procedure: COLONOSCOPY WITH PROPOFOL;  Surgeon: RDaneil Dolin MD;  Location: [facility];  Service: Endoscopy;  Laterality: N/A;  1:00pm  . CORONARY ARTERY BYPASS GRAFT  1998   [name]  . ES[facility]HAGECTOMY  [facility]   [facility] . ESOPHAGOGASTRODUODENOSCOPY  11/25/2010   Dr.Rourk- s/p esophagectomy with gastric pull-up, esophageal erosions straddling the surgical anastomosis, salmon colored epithelium coming up a good centimeter to a centimeter and a half above the suture line, islands of salmon colored epithelium in the most poximal residual esophagus, remainder of gastric mucosa appeared normal. bx= swamous &gastric glandular mucosa w/chronic active inflammation  . ESOPHAGOGASTRODUODENOSCOPY  03/17/2009   Dr.Rourk- 4cm segment of salmon-colored epithlium distal esophagus suspicious for barretts esophagus. area of suspicious nodularity w/in this segment bx seperately. small to moderate size hiatal hernia, o/w  normal stomach D1 and D2 bx=adenocarcinoma  . ESOPHAGOGASTRODUODENOSCOPY (EGD) WITH PROPOFOL N/A 10/24/2017   Procedure: ESOPHAGOGASTRODUODENOSCOPY (EGD) WITH PROPOFOL;  Surgeon: [name], MD;  Location[facility];  Service: Endoscopy;  Laterality: N/A;  . FEMORAL-POPLITEAL BYPASS GRAFT  1993   occlusive ds in R SFA  . GASTRIC PULL THROUGH  2010   With esophagectomy  . LIPOMA EXCISION  2010  . POLYPECTOMY  10/24/2017   Procedure: POLYPECTOMY;  Surgeon: [name], MD;  Location: [facility];  Service: Endoscopy;;  colon     Outpatient Encounter Medications as of 11/07/2017  Medication Sig  . acetaminophen (TYLENOL) 500 MG tablet Take 500 mg by mouth daily as needed for headache.  . albuterol (PROVENTIL) (2.5 MG/3ML) 0.083% nebulizer solution Take 3 mLs (2.5 mg  total) by nebulization every 6 (six) hours as needed for wheezing or shortness of breath. Patient will also need nebulizer machine please  . ALPRAZolam (XANAX) 0.5 MG tablet Take 1 tablet (0.5 mg total) by mouth at bedtime.  Marland Kitchen amLODipine (NORVASC) 5 MG tablet TAKE ONE (1) TABLET BY MOUTH EVERY DAY  . aspirin 81 MG EC tablet Take 81 mg by mouth at bedtime.   . brimonidine (ALPHAGAN) 0.2 % ophthalmic solution Place 1 drop into both eyes at bedtime.   . budesonide-formoterol (SYMBICORT) 160-4.5 MCG/ACT inhaler Inhale 2 puffs into the lungs 2 (two) times daily.  . clopidogrel (PLAVIX) 75 MG tablet TAKE ONE (1) TABLET EACH DAY  . latanoprost (XALATAN) 0.005 % ophthalmic solution Place 1 drop into both eyes at bedtime.   Marland Kitchen losartan (COZAAR) 100 MG tablet TAKE ONE (1) TABLET EACH DAY  . metoprolol succinate (TOPROL-XL) 50 MG 24 hr tablet TAKE ONE (1) TABLET EACH DAY  . montelukast (SINGULAIR) 10 MG tablet Take 10 mg by mouth daily.   . Multiple Vitamins-Minerals (CENTRUM SILVER PO) Take 1 tablet by mouth daily.    . nitroGLYCERIN (NITROSTAT) 0.4 MG SL tablet Place 1 tablet (0.4 mg total) under the tongue every 5 (five) minutes as needed for chest pain.  Marland Kitchen omeprazole (PRILOSEC) 40 MG capsule Take 40 mg by mouth daily.  . ranitidine (ZANTAC) 150 MG tablet Take 150 mg by mouth daily as needed for heartburn.   . rosuvastatin (CRESTOR) 10 MG tablet TAKE ONE (1) TABLET EACH DAY  . sucralfate (CARAFATE) 1 G tablet Take 1 tablet (1 g total) by mouth 4 (four) times daily -  with meals and at bedtime. (Patient taking differently: Take 1 g by mouth 4 (four) times daily -  with meals and at bedtime. As needed)  . tiotropium (SPIRIVA) 18 MCG inhalation capsule Place 18 mcg into inhaler and inhale daily.   No facility-administered encounter medications on file as of 11/07/2017.     Allergies  Allergen Reactions  . Altace [Ramipril] Cough    Immunization History  Administered Date(s) Administered  . Influenza,  High Dose Seasonal PF 05/09/2017  . Influenza,inj,Quad PF,6+ Mos 04/19/2015, 04/17/2016  . Influenza-Unspecified 05/01/2014  . Pneumococcal Conjugate-13 04/01/2014  . Pneumococcal Polysaccharide-23 04/17/2016    Current Medications, Allergies, Past Medical History, Past Surgical History, Family History, and Social History were reviewed in [employer] record.   Review of Systems            All symptoms NEG except where BOLDED >>  Constitutional:  F/C/S, fatigue, anorexia, unexpected weight change. HEENT:  HA, visual changes, hearing loss, earache, nasal symptoms, sore throat, mouth sores, hoarseness. Resp:  cough, sputum, hemoptysis; SOB, tightness, wheezing. Cardio:  CP, palpit, DOE, orthopnea, edema. GI:  N/V/D/C, blood in stool; reflux, abd pain, distention, gas. GU:  dysuria, freq, urgency, hematuria, flank pain, voiding difficulty. MS:  joint pain, swelling, tenderness, decr ROM; neck pain, back pain, etc. Neuro:  HA, tremors, seizures, dizziness, syncope, weakness, numbness, gait abn. Skin:  suspicious lesions or skin rash. Heme:  adenopathy, bruising, bleeding. Psyche:  confusion, agitation, sleep disturbance, hallucinations, anxiety, depression suicidal.   Objective:   Physical Exam      Vital Signs:  Reviewed...   General:  WD, WN, 72 y/o WM in NAD; alert & oriented; pleasant & cooperative... HEENT:  [place]/AT; Conjunctiva- pink, Sclera- nonicteric, EOM-wnl, PERRLA, EACs-clear, TMs-wnl; NOSE-clear; THROAT-clear & wnl.  Neck:  Supple w/ fair ROM; no JVD; normal carotid impulses w/o bruits; no thyromegaly or nodules palpated; no lymphadenopathy.  Chest:  decr BS bilat, without wheezes, rales, or rhonchi heard. Heart:  Regular Rhythm; gr1/6 SEM w/o rubs or gallops detected. Abdomen:  Soft & nontender- no guarding or rebound; normal bowel sounds; no organomegaly or masses palpated. Ext:  decrROM; without deformities +arthritic changes; no varicose veins,  +venous insuffic, or edema;  Pulses intact w/o bruits. Neuro:  No focal neuro deficits; sensory testing normal; gait normal & balance OK. Derm:  No lesions noted; no rash etc. Lymph:  No cervical, supraclavicular, axillary, or inguinal adenopathy palpated.   Assessment:      IMP >>     COPD/ emphysema ==> GOLD Stage 2-3 disease    7-34m RLL pulmonary nodule on CT Chest    [place]    HBP    CAD- s/p 5 vessel CABG    Carotid stenosis    ASPVD- s/p Fem-Pop    [facility] esophageal [facility]    Other medical issues as noted...   PLAN >>  01/30/15>  CBerthahas mod severe COPD/ emphysema based on his PFTs and heaqvy smoking hx; he would benefit from ICS/LABA and LAMA Rx=> rec to use SYMBICORT160-2spBid & SPIRIVA daily; he will continue PROAIR-HFA for acute symptoms prn, SINGULAIR10, and MUCINEX 6039mid for thick phlegm/ mucus;; he needs to maintain a vigorous antireflux regimen w/ his esoph dis & surg; as much as poss he will avoid infections and needs to start a vigorous exercise program (eg- silver sneakers or similar)... Prognosis is guarded w/ his signif co-morbidities (ASHD, ASPVD, Hx esoph ca w/ surg 2010, etc);  He will ret in 24m15mo FullPFTs to check DLCO. 04/03/15>  As prev noted- ChaMuhamad rec to stay on the Advair,Spiriva, Proair; avoid infections, get all approp vaccinations from his PCP; follow the vigorous antireflux regimen as outlined, and plan f/u w/ us [place] about 57mo45mooner if needed. 09/18/15>   I again explained to CharJshawn need for regular ICS/LABA medication- he needs to contact his insurance regarding a their prescription drug formulary, in the meanwhile we will refill the Symbicort160- 2spBid;  Rx written for ZPak w/ refills per his request. 05/12/16>   [place] [facility], treated w/ Levaquin & made a slow but steady  recovery; again requested to continue regular Rx w/ his Symbicort160-2spBid, Spiriva daily, Singulair10, Mucinex 1200mg324m fluids, etc 11/7 - 06/03/16>  [facility] w/ asp pneumonia & severe on-going reflux s/p esophageal cancer w/ esophagectomy &  gastric pull through at [facility] in 2010; he responded to antibiotics w/ Zosyn=> Augmentin, and CXR resolved over the next month... 07/27/16>  He is back to baseline, & reminded to use his inhalers regularly & encouraged to use the NEBULIZER as needed;  Needs to incr the Mucinex61m to 2Bidf w/ fluids;  O2sats are all in the 90s on RA- [facility]  Rec to continue his exercise program & call for any set backs in his status. 10/27/16>   Leven is stable w/ his severe cardiovasc & pulmonary issues as above;  He will continue the same meds for now &is encouraged to exercise as best he can;  He is up-to-date on vaccinations and he will call prn any problems w/ rov planned in 6422mo10/23/18>   We reviewed ChShamanmedical issues- rec to continue same rx + exercise; given 2018 Flu vaccine and we discussed ZPak to keep on hand for prn use; we plan rov recheck in [id number] if needed for acute problems. 11/07/17>   Jaxsyn is stable w/ his mult medical issues- from the pulmonary standpoint he remains stable on his Symbicort/ Spiriva regularly, and NEBS prn; no interval infections but he needs to remain diligent regarding his antireflux regimen s/p esoph surgery in 2010... We plan pulm follow up visit in [id number] if needed prn   Plan:       Patient's Medications  New Prescriptions   No medications on file  Previous Medications   ACETAMINOPHEN (TYLENOL) 500 MG TABLET    Take 500 mg by mouth daily as needed for headache.   ALBUTEROL (PROVENTIL) (2.5 MG/3ML) 0.083% NEBULIZER SOLUTION    Take 3 mLs (2.5 mg total) by nebulization every 6 (six) hours as needed for wheezing or shortness of breath. Patient will also need nebulizer machine please     ALPRAZOLAM (XANAX) 0.5 MG TABLET    Take 1 tablet (0.5 mg total) by mouth at bedtime.   AMLODIPINE (NORVASC) 5 MG TABLET    TAKE ONE (1) TABLET BY MOUTH EVERY DAY   ASPIRIN 81 MG EC TABLET    Take 81 mg by mouth at bedtime.    BRIMONIDINE (ALPHAGAN) 0.2 % OPHTHALMIC SOLUTION    Place 1 drop into both eyes at bedtime.    BUDESONIDE-FORMOTEROL (SYMBICORT) 160-4.5 MCG/ACT INHALER    Inhale 2 puffs into the lungs 2 (two) times daily.   CLOPIDOGREL (PLAVIX) 75 MG TABLET    TAKE ONE (1) TABLET EACH DAY   LATANOPROST (XALATAN) 0.005 % OPHTHALMIC SOLUTION    Place 1 drop into both eyes at bedtime.    LOSARTAN (COZAAR) 100 MG TABLET    TAKE ONE (1) TABLET EACH DAY   METOPROLOL SUCCINATE (TOPROL-XL) 50 MG 24 HR TABLET    TAKE ONE (1) TABLET EACH DAY   MONTELUKAST (SINGULAIR) 10 MG TABLET    Take 10 mg by mouth daily.    MULTIPLE VITAMINS-MINERALS (CENTRUM SILVER PO)    Take 1 tablet by mouth daily.     NITROGLYCERIN (NITROSTAT) 0.4 MG SL TABLET    Place 1 tablet (0.4 mg total) under the tongue every 5 (five) minutes as needed for chest pain.   OMEPRAZOLE (PRILOSEC) 40 MG CAPSULE    Take 40 mg by mouth daily.   RANITIDINE (ZANTAC) 150 MG TABLET    Take 150 mg by mouth daily as needed for heartburn.    ROSUVASTATIN (CRESTOR) 10 MG TABLET    TAKE ONE (  1) TABLET EACH DAY   SUCRALFATE (CARAFATE) 1 G TABLET    Take 1 tablet (1 g total) by mouth 4 (four) times daily -  with meals and at bedtime.   TIOTROPIUM (SPIRIVA) 18 MCG INHALATION CAPSULE    Place 18 mcg into inhaler and inhale daily.  Modified Medications   No medications on file  Discontinued Medications   No medications on file

## 2017-11-12 IMAGING — DX DG CHEST 2V
2 series · 2 of 2 positions shown · non-contrast
Comparison: PA and lateral chest x-ray December 11, 2014

CLINICAL DATA: Mild productive cough and chest congestion last
month; history of esophageal malignancy, open heart surgery, former
smoker.

EXAM:
CHEST  2 VIEW

[chest pa]
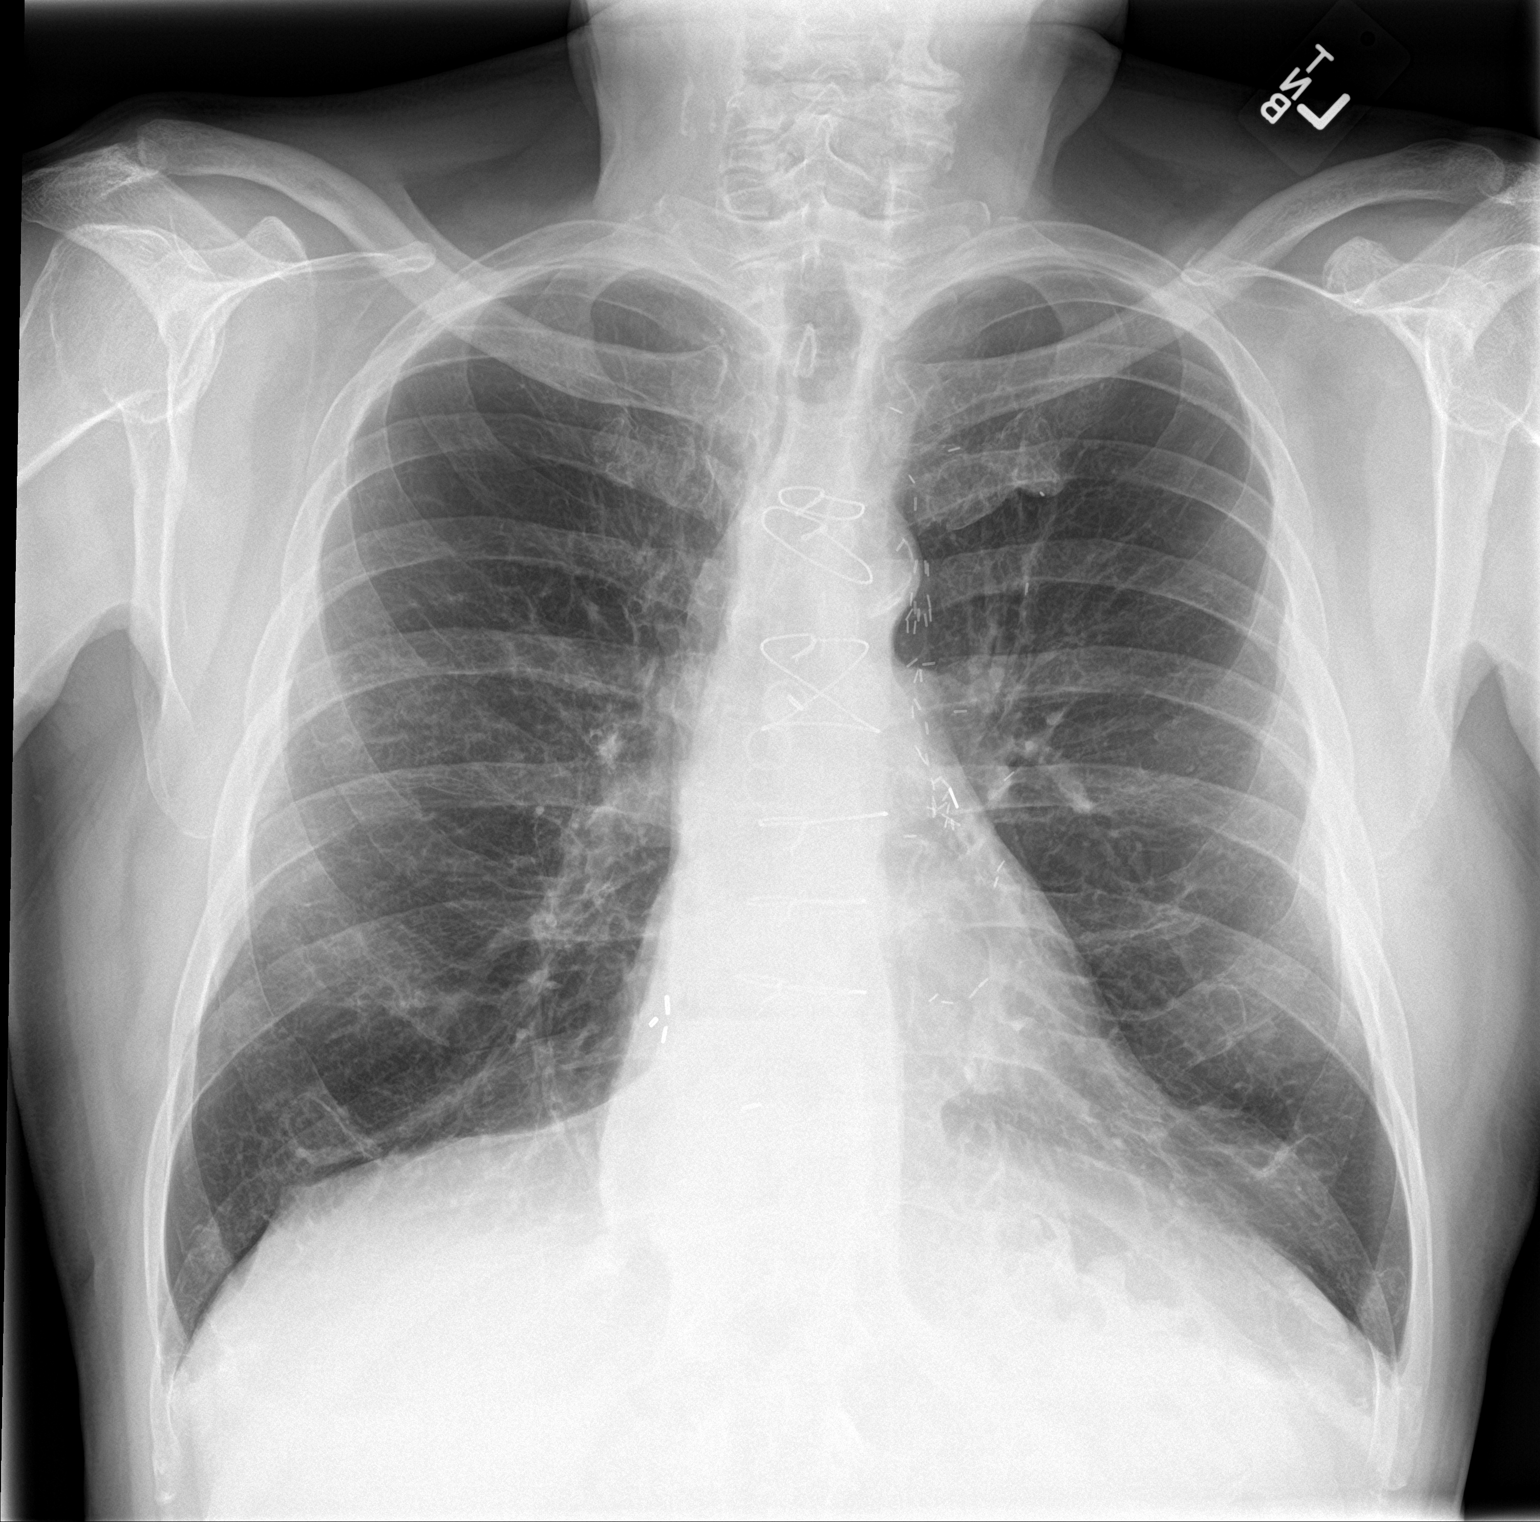

[chest lat]
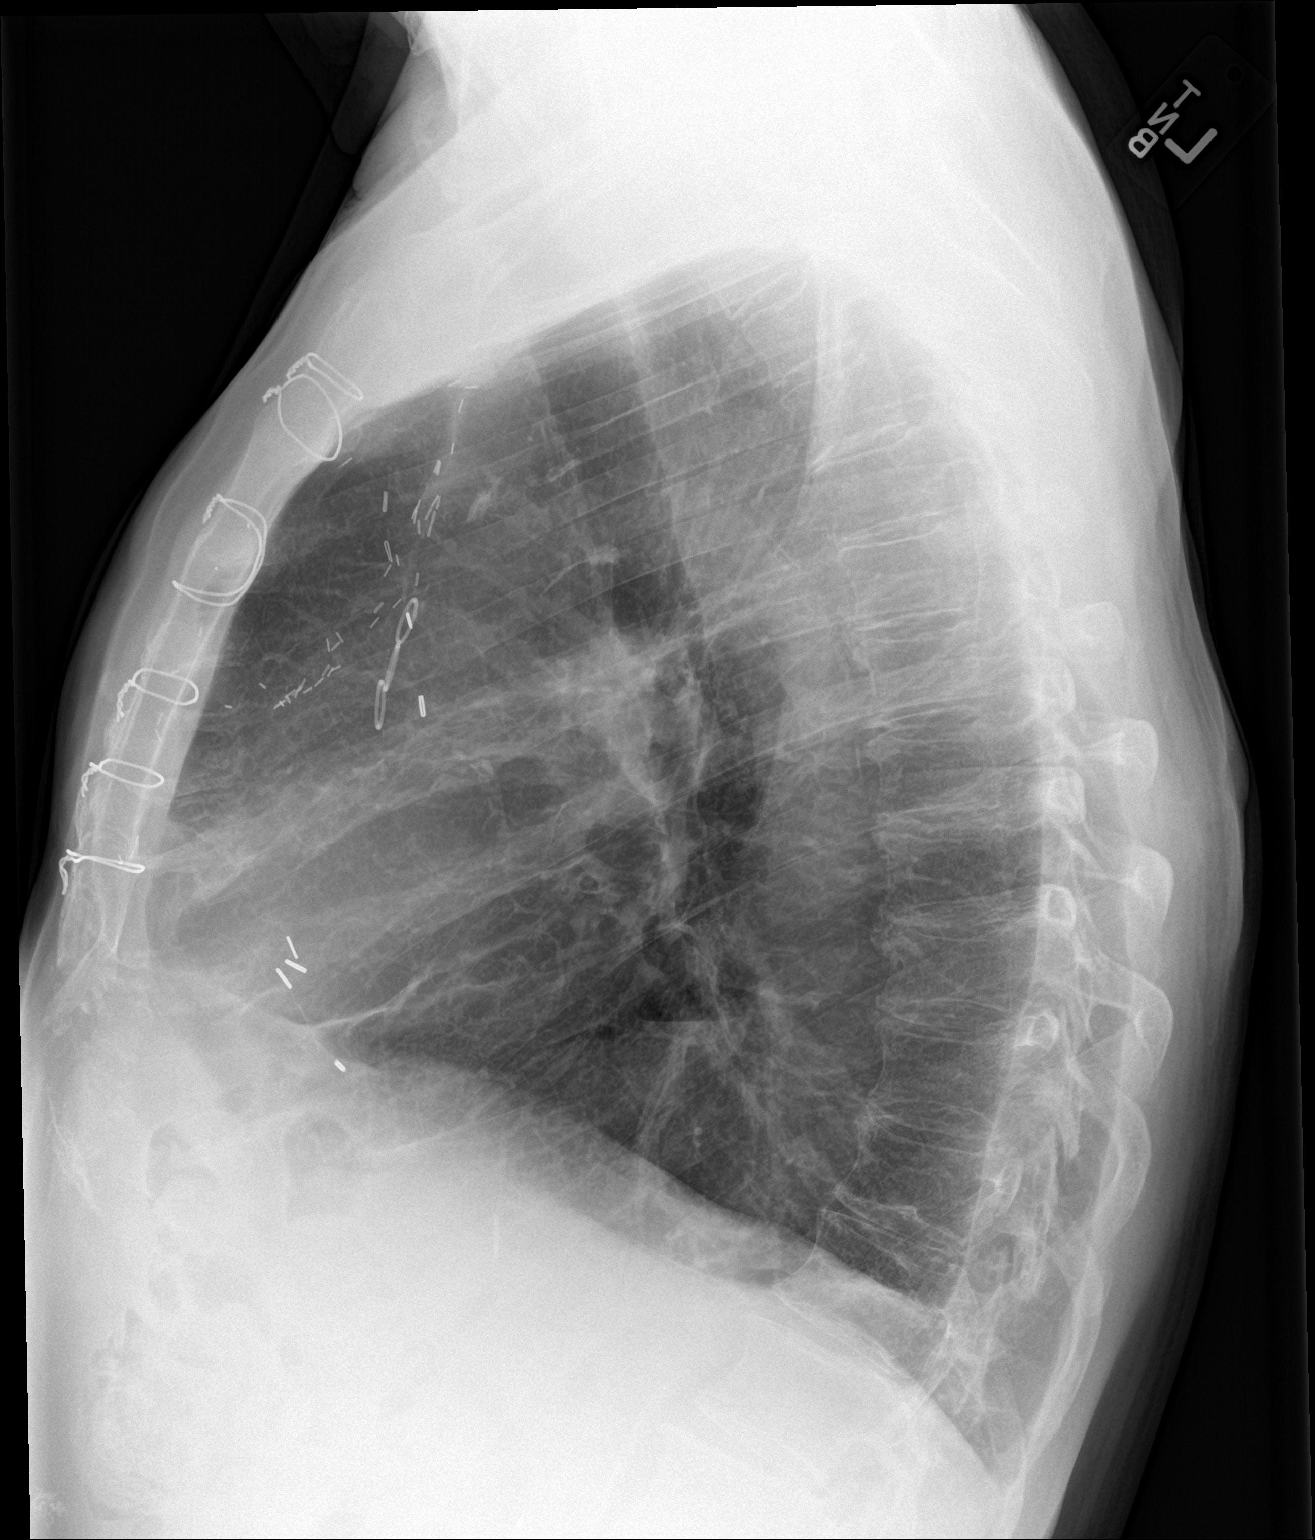

[2 of 2 positions shown; findings below may reference images not displayed]

FINDINGS: The lungs are mildly hyperinflated. There is no focal infiltrate.
The interstitial markings are coarse projecting over the cardiac
silhouette. There is a small hiatal hernia containing an air-fluid
level. The heart is normal in size. The patient has undergone
previous CABG. The mediastinum is normal in width. The bony thorax
exhibits no acute abnormality.
IMPRESSION: COPD. Mild interstitial prominence in the lingula likely reflects
acute bronchitic change. There is no alveolar pneumonia nor
pulmonary edema.

## 2017-11-14 ENCOUNTER — Ambulatory Visit (HOSPITAL_COMMUNITY)
Admission: RE | Admit: 2017-11-14 | Discharge: 2017-11-14 | Disposition: A | Discharge status: Home / Self Care | Payer: Medicare Other | Source: Ambulatory Visit | Attending: Cardiovascular Disease | Admitting: Cardiovascular Disease

## 2017-11-14 DIAGNOSIS — Z951 Presence of aortocoronary bypass graft: Secondary | ICD-10-CM | POA: Diagnosis not present

## 2017-11-14 DIAGNOSIS — Z95828 Presence of other vascular implants and grafts: Secondary | ICD-10-CM | POA: Insufficient documentation

## 2017-11-14 DIAGNOSIS — I2510 Atherosclerotic heart disease of native coronary artery without angina pectoris: Secondary | ICD-10-CM | POA: Insufficient documentation

## 2017-11-14 DIAGNOSIS — I10 Essential (primary) hypertension: Secondary | ICD-10-CM | POA: Insufficient documentation

## 2017-11-14 DIAGNOSIS — Z87891 Personal history of nicotine dependence: Secondary | ICD-10-CM | POA: Insufficient documentation

## 2017-11-14 DIAGNOSIS — I739 Peripheral vascular disease, unspecified: Secondary | ICD-10-CM | POA: Diagnosis not present

## 2017-11-14 DIAGNOSIS — J449 Chronic obstructive pulmonary disease, unspecified: Secondary | ICD-10-CM | POA: Insufficient documentation

## 2017-11-14 DIAGNOSIS — E785 Hyperlipidemia, unspecified: Secondary | ICD-10-CM | POA: Insufficient documentation

## 2017-11-16 ENCOUNTER — Ambulatory Visit (HOSPITAL_COMMUNITY): Payer: Medicare Other

## 2017-11-18 ENCOUNTER — Ambulatory Visit: Payer: Medicare Other | Admitting: Internal Medicine

## 2017-11-18 ENCOUNTER — Encounter

## 2017-11-21 ENCOUNTER — Other Ambulatory Visit: Payer: Self-pay | Admitting: Cardiovascular Disease

## 2017-11-21 NOTE — Telephone Encounter (Signed)
REFILL 

## 2017-11-22 ENCOUNTER — Ambulatory Visit (HOSPITAL_COMMUNITY)
Admission: RE | Admit: 2017-11-22 | Discharge: 2017-11-22 | Disposition: A | Discharge status: Home / Self Care | Payer: Medicare Other | Source: Ambulatory Visit | Attending: Internal Medicine | Admitting: Internal Medicine

## 2017-11-22 DIAGNOSIS — E279 Disorder of adrenal gland, unspecified: Secondary | ICD-10-CM | POA: Diagnosis not present

## 2017-11-22 DIAGNOSIS — R911 Solitary pulmonary nodule: Secondary | ICD-10-CM | POA: Diagnosis not present

## 2017-11-22 DIAGNOSIS — J432 Centrilobular emphysema: Secondary | ICD-10-CM | POA: Insufficient documentation

## 2017-11-22 DIAGNOSIS — C159 Malignant neoplasm of esophagus, unspecified: Secondary | ICD-10-CM

## 2017-11-22 DIAGNOSIS — Z9049 Acquired absence of other specified parts of digestive tract: Secondary | ICD-10-CM | POA: Diagnosis not present

## 2017-11-22 DIAGNOSIS — I700 Atherosclerosis of aorta: Secondary | ICD-10-CM | POA: Insufficient documentation

## 2017-11-22 DIAGNOSIS — R918 Other nonspecific abnormal finding of lung field: Secondary | ICD-10-CM | POA: Insufficient documentation

## 2017-11-22 DIAGNOSIS — R109 Unspecified abdominal pain: Secondary | ICD-10-CM | POA: Insufficient documentation

## 2017-11-22 MED ORDER — IOPAMIDOL (ISOVUE-300) INJECTION 61%
100.0000 mL | Freq: Once | INTRAVENOUS | Status: AC | PRN
  Administered 2017-11-22: 100 mL via INTRAVENOUS

## 2017-11-23 ENCOUNTER — Other Ambulatory Visit: Payer: Self-pay | Admitting: *Deleted

## 2017-11-23 DIAGNOSIS — R109 Unspecified abdominal pain: Secondary | ICD-10-CM

## 2017-11-23 NOTE — Progress Notes (Signed)
Hi-d

## 2017-11-28 ENCOUNTER — Encounter (HOSPITAL_COMMUNITY)
Admission: RE | Admit: 2017-11-28 | Discharge: 2017-11-28 | Disposition: A | Discharge status: Home / Self Care | Payer: Medicare Other | Source: Ambulatory Visit | Attending: Internal Medicine | Admitting: Internal Medicine

## 2017-11-28 ENCOUNTER — Encounter (HOSPITAL_COMMUNITY): Payer: Self-pay

## 2017-11-28 DIAGNOSIS — R109 Unspecified abdominal pain: Secondary | ICD-10-CM | POA: Diagnosis not present

## 2017-11-28 MED ORDER — TECHNETIUM TC 99M MEBROFENIN IV KIT
5.0000 | PACK | Freq: Once | INTRAVENOUS | Status: AC | PRN
  Administered 2017-11-28: 4.99 via INTRAVENOUS

## 2017-11-28 MED ORDER — SODIUM CHLORIDE 0.9% FLUSH
INTRAVENOUS | Status: AC
  Filled 2017-11-28: qty 250

## 2017-12-01 ENCOUNTER — Telehealth: Payer: Self-pay | Admitting: Cardiovascular Disease

## 2017-12-01 ENCOUNTER — Other Ambulatory Visit: Payer: Self-pay | Admitting: Cardiovascular Disease

## 2017-12-01 NOTE — Telephone Encounter (Signed)
New message    Patient calling for [place] results. Please call

## 2017-12-01 NOTE — Telephone Encounter (Signed)
Spoke with pt, aware of doppler results 

## 2017-12-06 ENCOUNTER — Other Ambulatory Visit: Payer: Self-pay | Admitting: *Deleted

## 2017-12-06 DIAGNOSIS — I739 Peripheral vascular disease, unspecified: Secondary | ICD-10-CM

## 2018-01-02 ENCOUNTER — Encounter: Payer: Self-pay | Admitting: Internal Medicine

## 2018-01-05 ENCOUNTER — Ambulatory Visit: Payer: Medicare Other | Admitting: Physician Assistant

## 2018-01-05 ENCOUNTER — Other Ambulatory Visit: Payer: Self-pay | Admitting: Physician Assistant

## 2018-01-05 ENCOUNTER — Encounter: Payer: Self-pay | Admitting: Physician Assistant

## 2018-01-05 VITALS — BP 154/78 | HR 72 | Ht 68.0 in | Wt 160.4 lb

## 2018-01-05 DIAGNOSIS — I10 Essential (primary) hypertension: Secondary | ICD-10-CM | POA: Diagnosis not present

## 2018-01-05 DIAGNOSIS — E119 Type 2 diabetes mellitus without complications: Secondary | ICD-10-CM

## 2018-01-05 DIAGNOSIS — E785 Hyperlipidemia, unspecified: Secondary | ICD-10-CM | POA: Diagnosis not present

## 2018-01-05 DIAGNOSIS — I25810 Atherosclerosis of coronary artery bypass graft(s) without angina pectoris: Secondary | ICD-10-CM | POA: Diagnosis not present

## 2018-01-05 DIAGNOSIS — I739 Peripheral vascular disease, unspecified: Secondary | ICD-10-CM

## 2018-01-05 DIAGNOSIS — I6523 Occlusion and stenosis of bilateral carotid arteries: Secondary | ICD-10-CM | POA: Diagnosis not present

## 2018-01-05 MED ORDER — CLOPIDOGREL BISULFATE 75 MG PO TABS: ORAL_TABLET | ORAL | 3 refills | Status: DC

## 2018-01-05 MED ORDER — AMLODIPINE BESYLATE 5 MG PO TABS: ORAL_TABLET | ORAL | 3 refills | Status: DC

## 2018-01-05 MED ORDER — NITROGLYCERIN 0.4 MG SL SUBL: 0.4000 mg | SUBLINGUAL_TABLET | SUBLINGUAL | 3 refills | Status: DC | PRN

## 2018-01-05 MED ORDER — ROSUVASTATIN CALCIUM 10 MG PO TABS: ORAL_TABLET | ORAL | 3 refills | Status: DC

## 2018-01-05 MED ORDER — LOSARTAN POTASSIUM 100 MG PO TABS: 100.0000 mg | ORAL_TABLET | Freq: Every day | ORAL | 3 refills | Status: DC

## 2018-01-05 MED ORDER — LOSARTAN POTASSIUM 100 MG PO TABS: ORAL_TABLET | ORAL | 3 refills | Status: DC

## 2018-01-05 MED ORDER — METOPROLOL SUCCINATE ER 50 MG PO TB24: ORAL_TABLET | ORAL | 3 refills | Status: DC

## 2018-01-05 NOTE — Patient Instructions (Signed)
Medication Instructions:  Take your Losartan daily at bedtime  Labwork: None   Testing/Procedures: Your physician has requested that you have a carotid duplex. This test is an ultrasound of the carotid arteries in your neck. It looks at blood flow through these arteries that supply the brain with blood. Allow one hour for this exam. There are no restrictions or special instructions. Schedule for August or September 2019  Follow-Up: Your physician wants you to follow-up in: 6-9 months with Dr Claiborne Billings. You will receive a reminder letter in the mail two months in advance. If you don't receive a letter, please call our office to schedule the follow-up appointment.  Any Other Special Instructions Will Be Listed Below (If Applicable).  If you need a refill on your cardiac medications before your next appointment, please call your pharmacy.

## 2018-01-05 NOTE — Progress Notes (Signed)
Cardiology Office Note    Date:  01/05/2018   ID:  [name], [name] [birth year], MRN [id number]  PCP:  Celene Squibb, MD  Cardiologist:  Dr. Claiborne Billings  Chief Complaint  Patient presents with  . Follow-up    seen for Dr. Claiborne Billings.     History of Present Illness:  Gregory Eaton is a 72 y.o. male with PMH of COPD, CAD s/p CABG 1998 by Dr. Prescott Gum, DM 2, PAD s/p femoral-popliteal bypass surgery with known occluded right SFA, Barrett's esophagus, hypertension, hyperlipidemia, carotid artery disease, and a history of right pulmonary nodule.  In December 2010, he underwent esophageal cancer surgery at [facility].  He has documented history of left vertebral artery occlusion with normal antegrade flow of the left external carotid artery.  Myoview in January 2015 showed normal perfusion without scar or ischemia.  His last cardiology office visit with Dr. Claiborne Billings was on 01/25/2017, he was doing well at the time.  Last carotid Doppler obtained on 03/01/2017 showed stable 1 to 39% right ICA stenosis, 40 to 59% left ICA stenosis.  Last ABI obtained on 11/14/2017 showed noncompressible bilateral lower extremity artery, short segment of occlusive left mid CFA, stable patent femoral-popliteal artery on the left, 30 to 49% stenosis noted in the tibioperoneal trunk.  He was recently seen by GI for abdominal pain, CT of abdomen and pelvis obtained on 11/22/2017 showed 8 mm right lower lobe nodule unchanged compared to 2015, 9 mm adrenal nodule unchanged compared to 2015 compatible with benign adrenal adenoma.  Otherwise no CT finding to explain the patient's abdominal pain.  Patient presents today for cardiology office visit.  He denies any recent chest pain or shortness of breath.  He has been able to chop wood's without any exertional symptoms.  He says his previous angina has always been tingling sensation in the chest, he has not really experienced any of the previous symptoms.  Overall he has been doing well, he  continued to have very mild abdominal discomfort.  He also has been dealing with some congestion recently, although I do not hear any obvious crackle, wheezing or rhonchi.  He is using his nebulizer every night.  Overall he appears to be euvolemic without significant JVD or lower extremity edema.  He has been compliant with this medication.  He does notice his blood pressures higher in the morning than in the afternoon.  By afternoon time, his blood pressure is usually in the 120s 130s.  It appears he is taking all 3 of his blood pressure in the morning, this is likely responsible for the elevated blood pressure early in the morning.  I will move his losartan to nighttime and keep his amlodipine and metoprolol for a.m. dose.  Otherwise he is due for carotid Doppler in August 2019.  He can follow-up with Dr. Claiborne Billings in 6 to 9 months.   Past Medical History:  Diagnosis Date  . Adenomatous colon polyp 03/2009   Last colonoscopy by Dr. [name]   . Adenomatous polyp 2010  . Adenomatous polyp of colon 11/03/2010  . Arthritis   . Barrett's esophagus   . CAD (coronary artery disease)   . COPD (chronic obstructive pulmonary disease) (HCC)    Severe emphysema per CT  . Diverticulosis   . DM type 2 (diabetes mellitus, type 2) ([place]) 04/09/2014   A1c 6.5 (04/08/14)   . Esophageal carcinoma ([place]) 03/2009   T1N1M0  . GERD (gastroesophageal reflux disease)   . Glaucoma   .  History of Doppler ultrasound 11/09/2011   03/2014- 50-69% L ICA stenosis;carotid doppler; L bulb/prox ICA 0-49% diameter reduction; L vertebral artery - occlusive ds; L ECA  demonstrates severe amount of fibrous plaque  . History of Doppler ultrasound 11/09/11   LEAs; R ABI - mod art. insuff.; L ABI normal at rest; R SFA - occlusive ds; L SFA - occlusive ds; patent fem-pop graft  . History of echocardiogram 08/27/2009   EF >55%  . History of kidney stones   . History of nuclear stress test 11/24/2011   lexiscan; normal perfusion; low risk scan;  non-diagnostic for ischemia  . Hyperlipidemia   . Hypertension   . Left carotid artery stenosis 04/08/2014  . Pulmonary nodule, right 04/08/2014   2.8 mm-incidental finding on CT  . PVD (peripheral vascular disease) ([place])   . Tachyarrhythmia 1999   Status post ablation at [facility]  . Tobacco abuse     Past Surgical History:  Procedure Laterality Date  . BIOPSY  10/24/2017   Procedure: BIOPSY;  Surgeon: [name], MD;  [facility];  Service: Endoscopy;;  esophagus  . COLONOSCOPY  03/17/2009   Dr.Rourk- normal rectum, sigmoid diverticula, some pale sigmoid mucosa with diffuse petechiae. pedunculated polyp at the splenic flexure, remainder of colonic mucosa appeared normal. bx= adenomatous polyp  . COLONOSCOPY  04/19/2003   Dr.Rehman- few diverticu;a at the sigmoid colon, 3 small polyps, one at the transverse colon and 2 at the sigmoid, small external hemorrhoids. bx report not available.   . COLONOSCOPY WITH PROPOFOL N/A 10/24/2017   Procedure: COLONOSCOPY WITH PROPOFOL;  Surgeon: [name], MD;  Location: [facility];  Service: Endoscopy;  Laterality: N/A;  1:00pm  . CORONARY ARTERY BYPASS GRAFT  1998   [name]  . ESOPHAGECTOMY  2010   [facility] Dr. [name]  . ESOPHAGOGASTRODUODENOSCOPY  11/25/2010   Dr.Rourk- s/p esophagectomy with gastric pull-up, esophageal erosions straddling the surgical anastomosis, salmon colored epithelium coming up a good centimeter to a centimeter and a half above the suture line, islands of salmon colored epithelium in the most poximal residual esophagus, remainder of gastric mucosa appeared normal. bx= swamous &gastric glandular mucosa w/chronic active inflammation  . ESOPHAGOGASTRODUODENOSCOPY  03/17/2009   Dr.Rourk- 4cm segment of salmon-colored epithlium distal esophagus suspicious for barretts esophagus. area of suspicious nodularity w/in this segment bx seperately. small to moderate size hiatal hernia, o/w normal stomach D1 and D2  bx=adenocarcinoma  . ESOPHAGOGASTRODUODENOSCOPY (EGD) WITH PROPOFOL N/A 10/24/2017   Procedure: ESOPHAGOGASTRODUODENOSCOPY (EGD) WITH PROPOFOL;  Surgeon: [name], MD;  [facility];  Service: Endoscopy;  Laterality: N/A;  . FEMORAL-POPLITEAL BYPASS GRAFT  1993   occlusive ds in R SFA  . GASTRIC PULL THROUGH  2010   With esophagectomy  . LIPOMA EXCISION  2010  . POLYPECTOMY  10/24/2017   Procedure: POLYPECTOMY;  Surgeon: [name], MD;  [facility];  Service: Endoscopy;;  colon     Current Medications: Outpatient Medications Prior to Visit  Medication Sig Dispense Refill  . acetaminophen (TYLENOL) 500 MG tablet Take 500 mg by mouth daily as needed for headache.    . albuterol (PROVENTIL) (2.5 MG/3ML) 0.083% nebulizer solution Take 3 mLs (2.5 mg total) by nebulization every 6 (six) hours as needed for wheezing or shortness of breath. Patient will also need nebulizer machine please 75 mL 12  . ALPRAZolam (XANAX) 0.5 MG tablet Take 1 tablet (0.5 mg total) by mouth at bedtime. Tripp  tablet 0  . aspirin 81 MG EC tablet Take 81 mg by mouth at bedtime.     . brimonidine (ALPHAGAN) 0.2 % ophthalmic solution Place 1 drop into both eyes at bedtime.     . budesonide-formoterol (SYMBICORT) 160-4.5 MCG/ACT inhaler Inhale 2 puffs into the lungs 2 (two) times daily. 2 Inhaler 0  . guaiFENesin (MUCINEX PO) Take 1,200 mg by mouth every 12 (twelve) hours as needed.    . latanoprost (XALATAN) 0.005 % ophthalmic solution Place 1 drop into both eyes at bedtime.     . montelukast (SINGULAIR) 10 MG tablet Take 10 mg by mouth daily.     . Multiple Vitamins-Minerals (CENTRUM SILVER PO) Take 1 tablet by mouth daily.      Marland Kitchen omeprazole (PRILOSEC) 40 MG capsule Take 40 mg by mouth daily.    . ranitidine (ZANTAC) 150 MG tablet Take 150 mg by mouth daily as needed for heartburn.     . sucralfate (CARAFATE) 1 G tablet Take 1 tablet (1 g total) by mouth 4 (four) times daily -  with meals and  at bedtime. (Patient taking differently: Take 1 g by mouth 4 (four) times daily -  with meals and at bedtime. As needed) 40 tablet 0  . tiotropium (SPIRIVA) 18 MCG inhalation capsule Place 18 mcg into inhaler and inhale daily.    Marland Kitchen amLODipine (NORVASC) 5 MG tablet TAKE ONE (1) TABLET BY MOUTH EVERY DAY 90 tablet 1  . clopidogrel (PLAVIX) 75 MG tablet TAKE ONE (1) TABLET BY MOUTH EVERY DAY 60 tablet 0  . losartan (COZAAR) 100 MG tablet TAKE ONE (1) TABLET EACH DAY 90 tablet 2  . metoprolol succinate (TOPROL-XL) 50 MG 24 hr tablet TAKE ONE (1) TABLET BY MOUTH EVERY DAY 60 tablet 0  . nitroGLYCERIN (NITROSTAT) 0.4 MG SL tablet Place 1 tablet (0.4 mg total) under the tongue every 5 (five) minutes as needed for chest pain. 25 tablet 3  . rosuvastatin (CRESTOR) 10 MG tablet TAKE ONE (1) TABLET BY MOUTH EVERY DAY 90 tablet 0   No facility-administered medications prior to visit.      Allergies:   Altace [ramipril]   Social History   Socioeconomic History  . Marital status: Married    Spouse name: Not on file  . Number of children: 2  . Years of education: Not on file  . Highest education level: Not on file  Occupational History  . 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 Financial resource strain: Not on file  . Food insecurity:    Worry: Not on file    Inability: Not on file  . Transportation needs:    Medical: Not on file    Non-medical: Not on file  Tobacco Use  . Smoking status: Former Smoker    Packs/day: 2.00    Years: 40.00    Pack years: 80.00    Types: Cigarettes    Last attempt to quit: 07/20/1995    Years since quitting: 22.4  . Smokeless tobacco: Never Used  Substance and Sexual Activity  . Alcohol use: No    Alcohol/week: 0.0 oz  . Drug use: No  . Sexual activity: Yes    Partners: Female    Birth control/protection: Condom    Comment: friend  Lifestyle  . Physical activity:    Days per week: Not on file    Minutes per  session: Not on file  . Stress: Not  on file  Relationships  . Social connections:    Talks on phone: Not on file    Gets together: Not on file    Attends religious service: Not on file    Active member of club or organization: Not on file    Attends meetings of clubs or organizations: Not on file    Relationship status: Not on file  Other Topics Concern  . Not on file  Social History Narrative  . Not on file     Family History:  The patient's family history includes Congenital heart disease in his sister; Coronary artery disease in his brother; GER disease in his mother.   ROS:   Please see the history of present illness.    ROS All other systems reviewed and are negative.   PHYSICAL EXAM:   VS:  BP (!) 154/78   Pulse 72   Ht 5\' 8"  (1.727 m)   Wt 160 lb 6.4 oz (72.8 kg)   BMI 24.39 kg/m    GEN: Well nourished, well developed, in no acute distress  HEENT: normal  Neck: no JVD, carotid bruits, or masses Cardiac: RRR; no murmurs, rubs, or gallops,no edema  Respiratory:  clear to auscultation bilaterally, normal work of breathing GI: soft, nontender, nondistended, + BS MS: no deformity or atrophy  Skin: warm and dry, no rash Neuro:  Alert and Oriented x 3, Strength and sensation are intact Psych: euthymic mood, full affect  Wt Readings from Last 3 Encounters:  01/05/18 160 lb 6.4 oz (72.8 kg)  11/07/17 161 lb 3.2 oz (73.1 kg)  10/19/17 164 lb (74.4 kg)      Studies/Labs Reviewed:   EKG:  EKG is ordered today.  The ekg ordered today demonstrates normal sinus rhythm, first-degree AV block, right bundle branch block  Recent Labs: 10/19/2017: BUN 13; Creatinine, Ser 0.82; Hemoglobin 14.6; Platelets 282; Potassium 4.2; Sodium 139   Lipid Panel    Component Value Date/Time   CHOL 96 01/31/2013 0940   TRIG 78 01/31/2013 0940   HDL 37 (L) 01/31/2013 0940   CHOLHDL 2.6 01/31/2013 0940   VLDL 16 01/31/2013 0940   LDLCALC 43 01/31/2013 0940    Additional studies/  records that were reviewed today include:   Abd CT 11/22/2017 IMPRESSION: Status post esophagectomy with gastric pull-through.  8 mm right lower lobe nodule, unchanged since 2015, benign given stability. Dedicated follow-up imaging is not required per Fleischner Society guidelines.  No evidence of acute cardiopulmonary disease.  9 mm right adrenal nodule, unchanged since 2015, compatible with a benign adrenal adenoma.  No CT findings to account for the patient's abdominal pain.  Additional ancillary findings as above.  Aortic Atherosclerosis (ICD10-I70.0) and Emphysema (ICD10-J43.9).    ASSESSMENT:    1. Coronary artery disease involving coronary bypass graft of native heart without angina pectoris   2. Essential hypertension   3. Hyperlipidemia, unspecified hyperlipidemia type   4. Controlled type 2 diabetes mellitus without complication, without long-term current use of insulin ([place])   5. PAD (peripheral artery disease) ([place])   6. Bilateral carotid artery stenosis      PLAN:  In order of problems listed above:  1. CAD s/p CABG: Continue aspirin and Crestor.  No obvious angina  2. Hypertension: Blood pressure high, he takes all 3 blood pressure medication in the morning, will switch losartan to p.m. dosing.  Reassess on follow-up.  3. Hyperlipidemia: On Crestor 10 mg daily, lipid panel followed by primary care provider  4.  DM2: Managed by primary care provider  5. PAD: Denies significant leg discomfort.  History of bypass graft in the left lower extremity.  Due for lower extremity arterial Doppler in April 2020  6. Bilateral carotid artery disease: Patient is due for carotid Doppler in August 2019.  Previous Doppler in 2018 shows moderate disease on the left, mild disease on the right.    Medication Adjustments/Labs and Tests Ordered: Current medicines are reviewed at length with the patient today.  Concerns regarding medicines are outlined above.  Medication  changes, Labs and Tests ordered today are listed in the Patient Instructions below. Patient Instructions  Medication Instructions:  Take your Losartan daily at bedtime  Labwork: None   Testing/Procedures: Your physician has requested that you have a carotid duplex. This test is an ultrasound of the carotid arteries in your neck. It looks at blood flow through these arteries that supply the brain with blood. Allow one hour for this exam. There are no restrictions or special instructions. Schedule for August or September 2019  Follow-Up: Your physician wants you to follow-up in: 6-9 months with Dr Claiborne Billings. You will receive a reminder letter in the mail two months in advance. If you don't receive a letter, please call our office to schedule the follow-up appointment.  Any Other Special Instructions Will Be Listed Below (If Applicable).  If you need a refill on your cardiac medications before your next appointment, please call your pharmacy.     Hilbert Corrigan, [place]  01/05/2018 10:04 AM    [address], [place], [place]  [address] Phone: [phone]; Fax: [phone]

## 2018-01-10 ENCOUNTER — Telehealth: Payer: Self-pay | Admitting: Pulmonary Disease

## 2018-01-10 MED ORDER — AMOXICILLIN-POT CLAVULANATE 875-125 MG PO TABS: 1.0000 | ORAL_TABLET | Freq: Two times a day (BID) | ORAL | 0 refills | Status: DC

## 2018-01-10 MED ORDER — PREDNISONE 20 MG PO TABS: ORAL_TABLET | ORAL | 0 refills | Status: DC

## 2018-01-10 NOTE — Telephone Encounter (Signed)
Spoke with patient. He stated that he has been coughing up yellow phlegm for the past few days. He has also has a sinus infection. Lots of pressure in his face around his nose. He has been using his albuterol neb. Solution and Mucinex, but they have not been helping him. Denies any SOB, chest pain or body aches. He wishes to have an abx called in for him.   He wishes to use [facility].   SN, please advise. Thanks!

## 2018-01-10 NOTE — Telephone Encounter (Signed)
Per SN- Augmentin 875mg  #14, 1 BID and Prednisone 20mg , #12, 1 BID x's 3 days, then 1 daily x's 3 days, then 1/2 tab daily until gone. Patient notified and stated understanding.  Prescription sent to [facility]. Nothing further at this time.

## 2018-01-13 DIAGNOSIS — E785 Hyperlipidemia, unspecified: Secondary | ICD-10-CM | POA: Diagnosis not present

## 2018-01-13 DIAGNOSIS — I10 Essential (primary) hypertension: Secondary | ICD-10-CM | POA: Diagnosis not present

## 2018-01-16 ENCOUNTER — Ambulatory Visit: Payer: Medicare Other | Admitting: Gastroenterology

## 2018-01-25 DIAGNOSIS — I10 Essential (primary) hypertension: Secondary | ICD-10-CM | POA: Diagnosis not present

## 2018-01-25 DIAGNOSIS — R7301 Impaired fasting glucose: Secondary | ICD-10-CM | POA: Diagnosis not present

## 2018-01-25 DIAGNOSIS — K210 Gastro-esophageal reflux disease with esophagitis: Secondary | ICD-10-CM | POA: Diagnosis not present

## 2018-01-25 DIAGNOSIS — J449 Chronic obstructive pulmonary disease, unspecified: Secondary | ICD-10-CM | POA: Diagnosis not present

## 2018-01-25 DIAGNOSIS — E782 Mixed hyperlipidemia: Secondary | ICD-10-CM | POA: Diagnosis not present

## 2018-01-27 ENCOUNTER — Ambulatory Visit: Payer: Medicare Other | Admitting: Gastroenterology

## 2018-03-09 ENCOUNTER — Other Ambulatory Visit: Payer: Self-pay

## 2018-03-15 ENCOUNTER — Other Ambulatory Visit: Payer: Self-pay | Admitting: Pulmonary Disease

## 2018-03-16 ENCOUNTER — Inpatient Hospital Stay (HOSPITAL_COMMUNITY): Admission: RE | Admit: 2018-03-16 | Payer: Medicare Other | Source: Ambulatory Visit

## 2018-04-05 ENCOUNTER — Ambulatory Visit (HOSPITAL_COMMUNITY)
Admission: RE | Admit: 2018-04-05 | Discharge: 2018-04-05 | Disposition: A | Discharge status: Home / Self Care | Payer: Medicare Other | Source: Ambulatory Visit | Attending: Cardiovascular Disease | Admitting: Cardiovascular Disease

## 2018-04-05 DIAGNOSIS — I6523 Occlusion and stenosis of bilateral carotid arteries: Secondary | ICD-10-CM | POA: Insufficient documentation

## 2018-04-07 ENCOUNTER — Ambulatory Visit (INDEPENDENT_AMBULATORY_CARE_PROVIDER_SITE_OTHER): Payer: Medicare Other | Admitting: Gastroenterology

## 2018-04-07 ENCOUNTER — Encounter: Payer: Self-pay | Admitting: Gastroenterology

## 2018-04-07 VITALS — BP 172/73 | HR 64 | Temp 97.0°F | Ht 68.0 in | Wt 163.2 lb

## 2018-04-07 DIAGNOSIS — R1013 Epigastric pain: Secondary | ICD-10-CM

## 2018-04-07 DIAGNOSIS — G8929 Other chronic pain: Secondary | ICD-10-CM

## 2018-04-07 DIAGNOSIS — K219 Gastro-esophageal reflux disease without esophagitis: Secondary | ICD-10-CM

## 2018-04-07 DIAGNOSIS — K2270 Barrett's esophagus without dysplasia: Secondary | ICD-10-CM | POA: Diagnosis not present

## 2018-04-07 MED ORDER — DEXLANSOPRAZOLE 60 MG PO CPDR: DELAYED_RELEASE_CAPSULE | ORAL | 0 refills | Status: DC

## 2018-04-07 NOTE — Patient Instructions (Signed)
1. Stop omeprazole for now. Try Dexilant, take 30 minutes before evening meal. Samples provided.  2. Call and let me know how you do with the samples. It looks like it is covered by your insurance now but we wouldn't know for sure unless we send in a prescription.  3. Try elevating the head of your mattress as discussed.

## 2018-04-07 NOTE — Progress Notes (Signed)
Primary Care Physician: Celene Squibb, MD  Primary Gastroenterologist:  Garfield Cornea, MD   Chief Complaint  Patient presents with  . Abdominal Pain    still has pain that wakes him up in the middle of the night, not on a regular basis.   Marland Kitchen Dysphagia    no issues    HPI: Gregory Eaton is a 72 y.o. male here for follow-up.  He has a history of chronic epigastric pain undergoing extensive evaluation since his last office visit here.  History of remote esophageal cancer in 2010.  Underwent surgical resection.  Considered cured.  Recent EGD in April showed previous esophagectomy with remnant of esophagus, anastomosis with stomach at 24 cm from the incisors.  Centimeters above the anastomosis, suspicious for Barrett's, confirmed with biopsy, no dysplasia.  Remaining stomach and duodenum are normal.  He also had a colonoscopy for personal history of adenomatous colon polyps.  He had multiple diverticula, 11 mm polyp at the ileocecal valve was tubular adenoma.  Advised to have repeat colonoscopy in 3 years.  Patient also had right upper quadrant ultrasound, CT chest/abdomen/pelvis, HIDA scan.  He has gallstones but felt to be asymptomatic.  No other explanation for his pain seen on radiographical studies.  Patient states his symptoms are not as bad as they were before but he continues to wake up every morning around 3-4 AM due to epigastric pain.  This is followed by belching.  Only getting about 5 hours of sleep.  When he wakes up he has to go sit up in the recliner.  About once a week he actually has regurgitation during these episodes.  He denies having any symptoms throughout the day.  No postprandial symptoms.  In the past he tried elevating the head of the bed with bricks but his current bed will not allow that.  He has tried sleeping with a pillow wedge but did not tolerate due to his back.  Currently sleeping on a couple of pillows.  Denies dysphagia.  Continues to take omeprazole twice  daily before meals.  Bowel movements are fine.  No melena or rectal bleeding.  No weight loss.    Current Outpatient Medications  Medication Sig Dispense Refill  . acetaminophen (TYLENOL) 500 MG tablet Take 500 mg by mouth daily as needed for headache.    . albuterol (PROVENTIL) (2.5 MG/3ML) 0.083% nebulizer solution Take 3 mLs (2.5 mg total) by nebulization every 6 (six) hours as needed for wheezing or shortness of breath. Patient will also need nebulizer machine please 75 mL 12  . ALPRAZolam (XANAX) 0.5 MG tablet Take 1 tablet (0.5 mg total) by mouth at bedtime. (Patient taking differently: Take 0.5 mg by mouth at bedtime as needed. ) 30 tablet 0  . amLODipine (NORVASC) 5 MG tablet TAKE ONE (1) TABLET BY MOUTH EVERY DAY 90 tablet 3  . aspirin 81 MG EC tablet Take 81 mg by mouth at bedtime.     . brimonidine (ALPHAGAN) 0.2 % ophthalmic solution Place 1 drop into both eyes at bedtime.     . budesonide-formoterol (SYMBICORT) 160-4.5 MCG/ACT inhaler INHALE TWO PUFFS INTO THE LUNGS TWO TIMES DAILY 30.6 g 3  . clopidogrel (PLAVIX) 75 MG tablet TAKE ONE (1) TABLET BY MOUTH EVERY DAY 90 tablet 3  . guaiFENesin (MUCINEX PO) Take 1,200 mg by mouth every 12 (twelve) hours as needed.    . latanoprost (XALATAN) 0.005 % ophthalmic solution Place 1 drop into both eyes at  bedtime.     Marland Kitchen losartan (COZAAR) 100 MG tablet Take 1 tablet (100 mg total) by mouth at bedtime. 90 tablet 3  . metoprolol succinate (TOPROL-XL) 50 MG 24 hr tablet TAKE ONE (1) TABLET BY MOUTH EVERY DAY 90 tablet 3  . montelukast (SINGULAIR) 10 MG tablet Take 10 mg by mouth daily.     . Multiple Vitamins-Minerals (CENTRUM SILVER PO) Take 1 tablet by mouth daily.      . nitroGLYCERIN (NITROSTAT) 0.4 MG SL tablet Place 1 tablet (0.4 mg total) under the tongue every 5 (five) minutes as needed for chest pain. 25 tablet 3  . omeprazole (PRILOSEC) 40 MG capsule Take 40 mg by mouth daily.    . ranitidine (ZANTAC) 150 MG tablet Take 150 mg by mouth  daily as needed for heartburn.     . rosuvastatin (CRESTOR) 10 MG tablet TAKE ONE (1) TABLET BY MOUTH EVERY DAY 90 tablet 3  . sucralfate (CARAFATE) 1 G tablet Take 1 tablet (1 g total) by mouth 4 (four) times daily -  with meals and at bedtime. (Patient taking differently: Take 1 g by mouth 4 (four) times daily -  with meals and at bedtime. As needed) 40 tablet 0  . tiotropium (SPIRIVA) 18 MCG inhalation capsule Place 18 mcg into inhaler and inhale daily.     No current facility-administered medications for this visit.     Allergies as of 04/07/2018 - Review Complete 04/07/2018  Allergen Reaction Noted  . Altace [ramipril] Cough 04/07/2014    ROS:  General: Negative for anorexia, weight loss, fever, chills, fatigue, weakness. ENT: Negative for hoarseness, difficulty swallowing , nasal congestion. CV: Negative for chest pain, angina, palpitations, dyspnea on exertion, peripheral edema.  Respiratory: Negative for dyspnea at rest, dyspnea on exertion, cough, sputum, wheezing.  GI: See history of present illness. GU:  Negative for dysuria, hematuria, urinary incontinence, urinary frequency, nocturnal urination.  Endo: Negative for unusual weight change.    Physical Examination:   BP (!) 172/73   Pulse 64   Temp (!) 97 F (36.1 C) (Oral)   Ht 5\' 8"  (1.727 m)   Wt 163 lb 3.2 oz (74 kg)   BMI 24.81 kg/m   General: Well-nourished, well-developed in no acute distress.  Eyes: No icterus. Mouth: Oropharyngeal mucosa moist and pink , no lesions erythema or exudate. Lungs: Clear to auscultation bilaterally.  Heart: Regular rate and rhythm, no murmurs rubs or gallops.  Abdomen: Bowel sounds are normal, nontender, nondistended, no hepatosplenomegaly or masses, no abdominal bruits or hernia , no rebound or guarding.   Extremities: No lower extremity edema. No clubbing or deformities. Neuro: Alert and oriented x 4   Skin: Warm and dry, no jaundice.   Psych: Alert and cooperative, normal  mood and affect.  Labs:  Lab Results  Component Value Date   CREATININE 0.82 10/19/2017   BUN 13 10/19/2017   NA 139 10/19/2017   K 4.2 10/19/2017   CL 105 10/19/2017   CO2 21 (L) 10/19/2017    Lab Results  Component Value Date   WBC 6.4 10/19/2017   HGB 14.6 10/19/2017   HCT 44.1 10/19/2017   MCV 92.3 10/19/2017   PLT 282 10/19/2017    Imaging Studies: No results found.

## 2018-04-07 NOTE — Assessment & Plan Note (Signed)
Persistent nocturnal epigastric pain, waking patient up from sleep every morning between 3 and 4 AM.  Associated with belching, occasional regurgitation.  No symptoms throughout the day.  Suspect symptoms related to reflux.  Symptoms somewhat better initially on omeprazole twice daily but now complaining of ongoing pain which wakes him up.Marland Kitchen  Extensive evaluation as outlined including right upper quadrant ultrasound, CT chest/abdomen/pelvis, EGD, HIDA scan.  Patient has gallstones but it is not felt that his symptoms are related to cholelithiasis at this point.  Trial of Dexilant 60 mg 30 minutes before his evening meal.  Patient reports that this helped him in the past when he had similar issues.  Fortunately back then his insurance will not cover it but it appears that it may now.  He wants to try the medication and will let me know how he does in the next 1 to 2 weeks.  Also would advise patient raise his mattress up about 4 inches, consider putting something under the [place] to do this.  Patient not feel like he can raise the head or up based on the style of bed he has.  He did not tolerate probably wedge due to his back.  Patient was found to have Barrett's on most recent EGD.  EGD planned years.

## 2018-04-10 NOTE — Progress Notes (Signed)
CC'D TO PCP °

## 2018-04-14 ENCOUNTER — Other Ambulatory Visit: Payer: Self-pay | Admitting: Physician Assistant

## 2018-04-14 DIAGNOSIS — E041 Nontoxic single thyroid nodule: Secondary | ICD-10-CM

## 2018-04-21 ENCOUNTER — Ambulatory Visit (HOSPITAL_COMMUNITY)
Admission: RE | Admit: 2018-04-21 | Discharge: 2018-04-21 | Disposition: A | Discharge status: Home / Self Care | Payer: Medicare Other | Source: Ambulatory Visit | Attending: Physician Assistant | Admitting: Physician Assistant

## 2018-04-21 DIAGNOSIS — Z23 Encounter for immunization: Secondary | ICD-10-CM | POA: Diagnosis not present

## 2018-04-21 DIAGNOSIS — E041 Nontoxic single thyroid nodule: Secondary | ICD-10-CM

## 2018-04-23 IMAGING — DX DG CHEST 2V
2 series · 2 of 2 positions shown · non-contrast
Comparison: 12/02/2015

CLINICAL DATA: Follow-up pneumonia.  Persistent cough.

EXAM:
CHEST  2 VIEW

[chest pa]
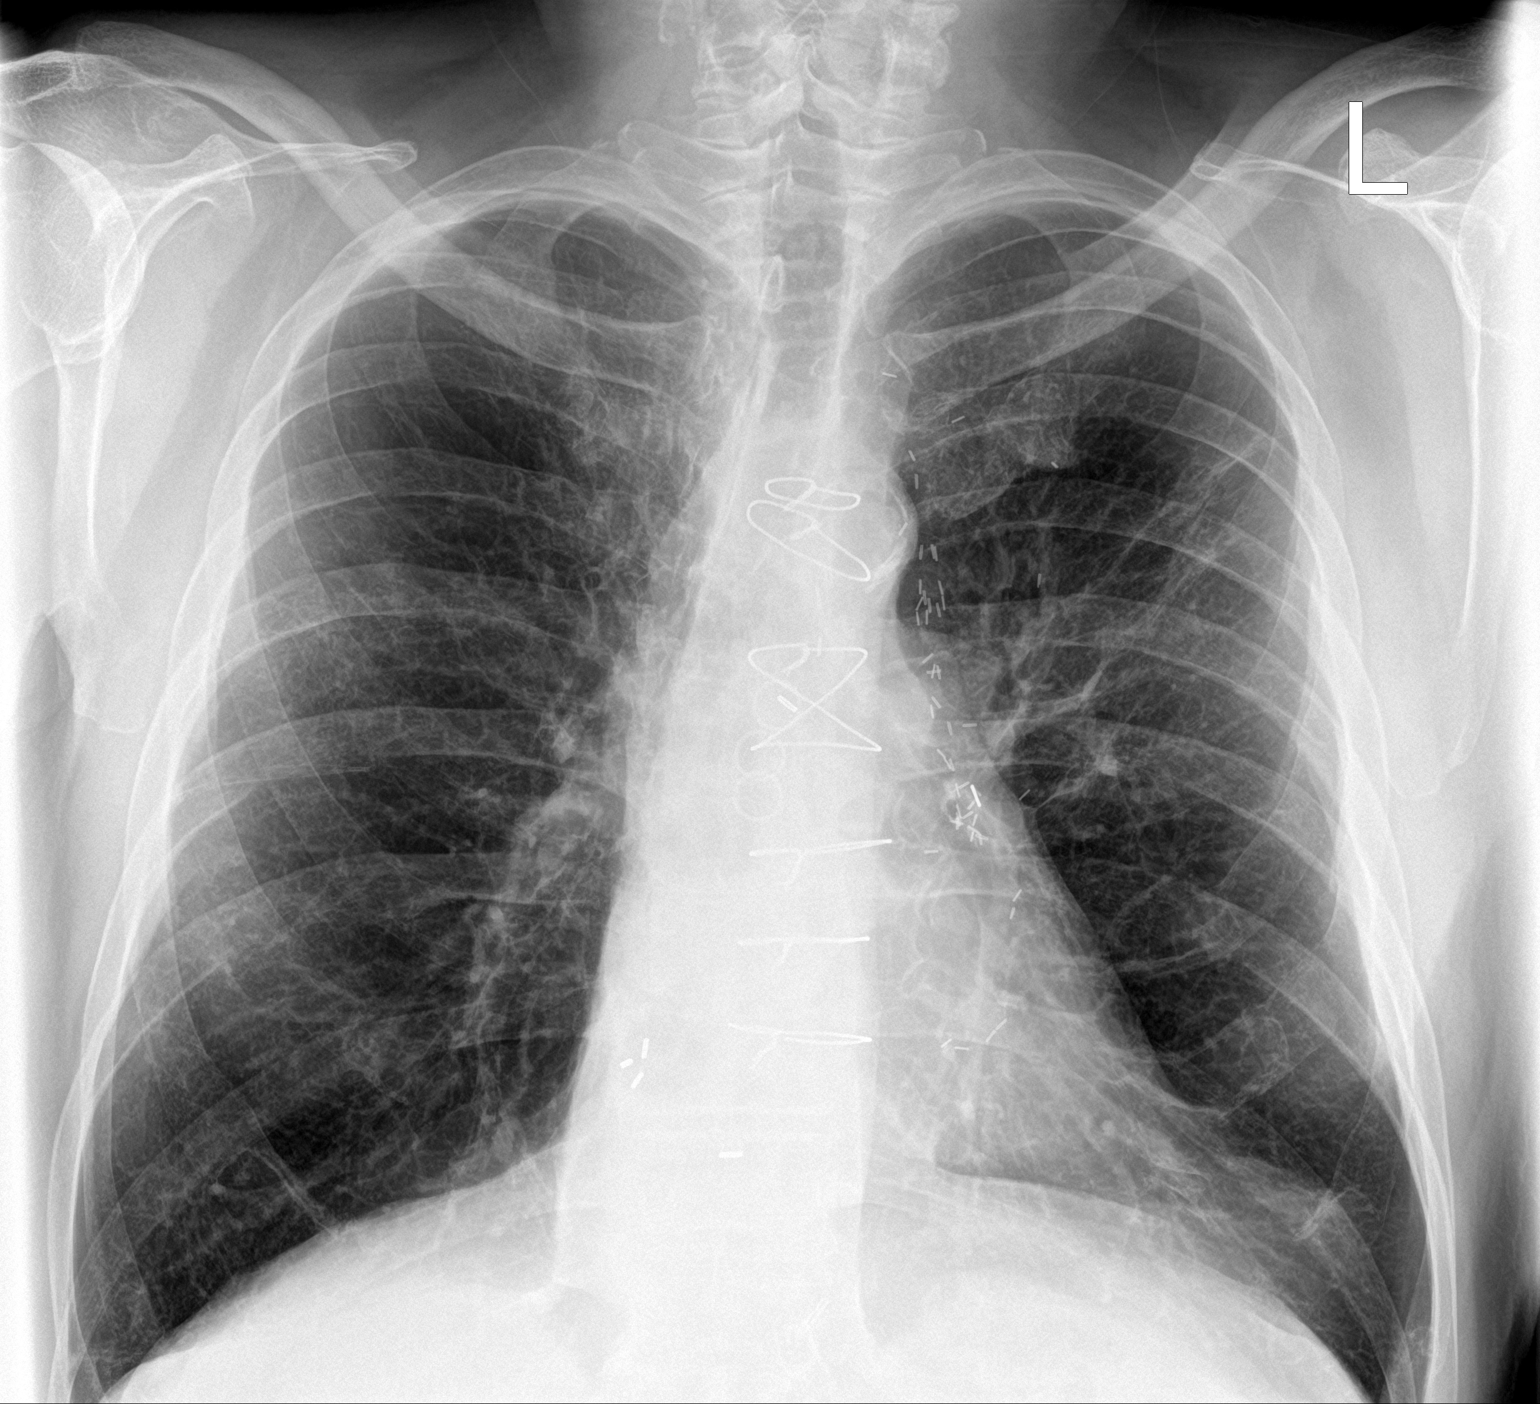

[chest lat]
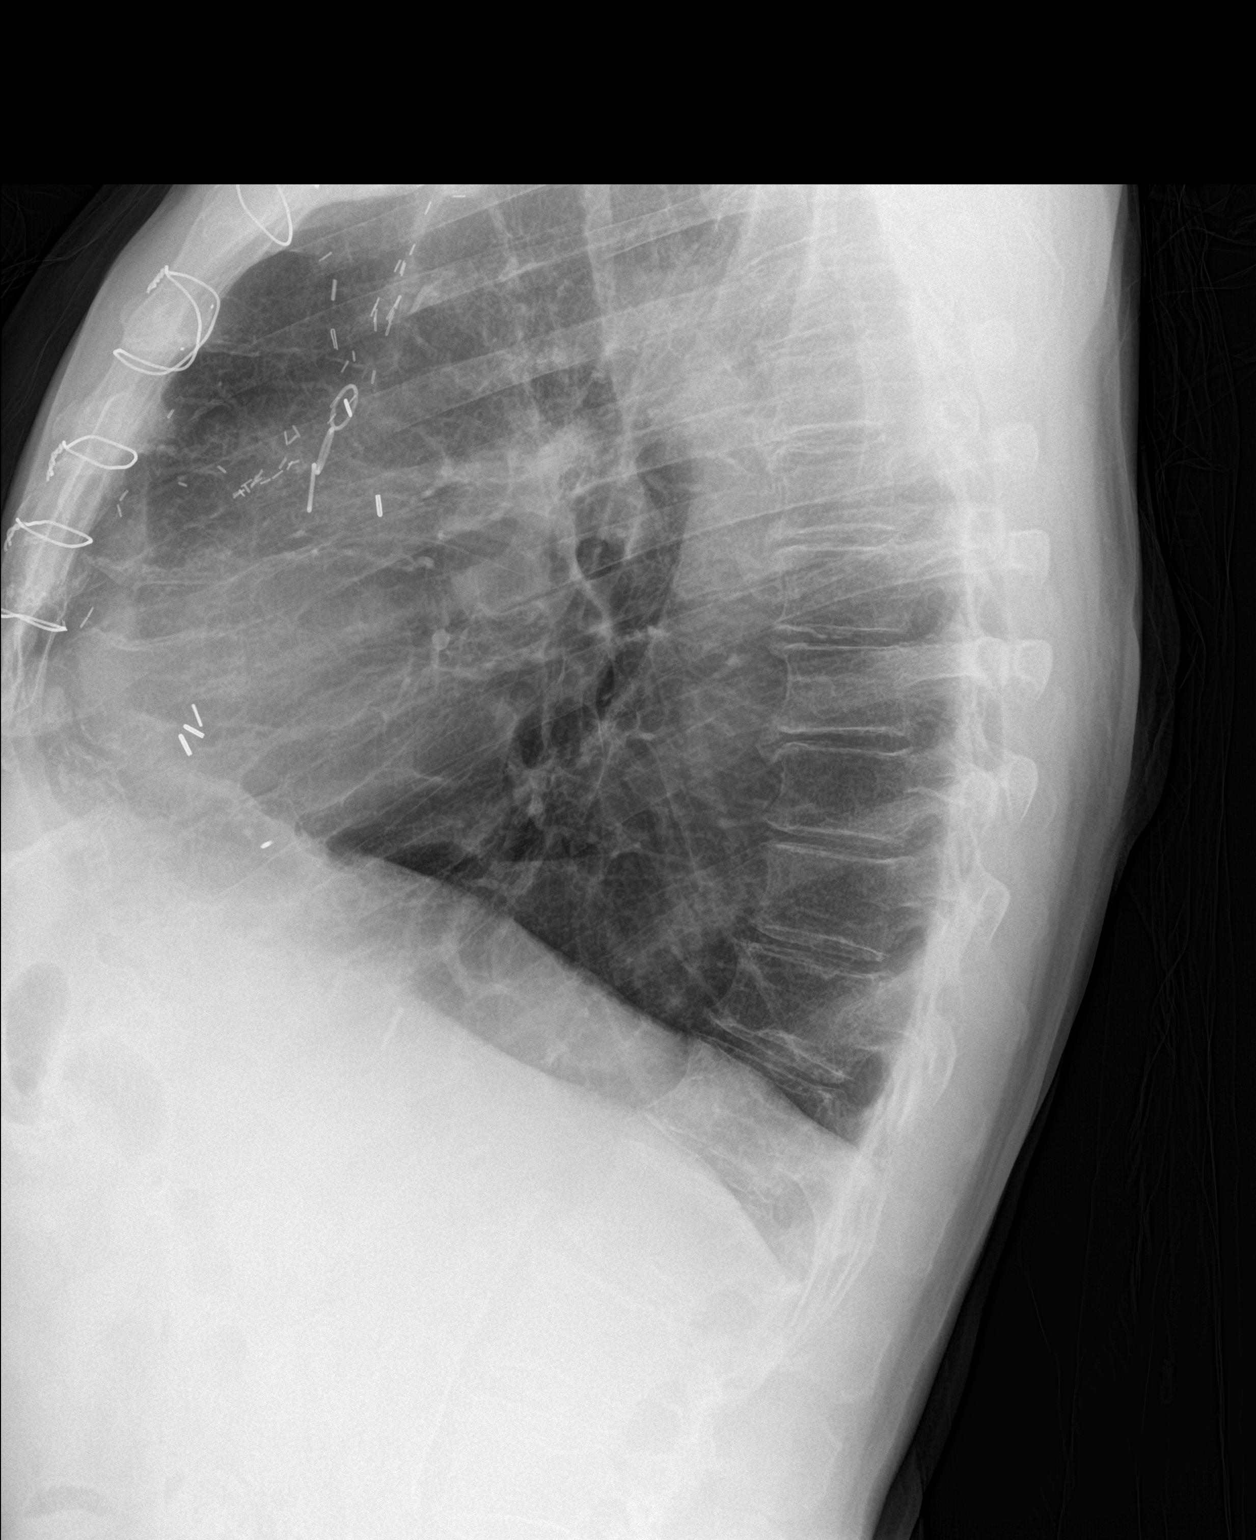

[2 of 2 positions shown; findings below may reference images not displayed]

FINDINGS: Linear densities persist in the lingula, favor scarring. Linear
densities in the left upper lobe likely reflects scarring as well.
There is hyperinflation of the lungs compatible with COPD. Prior
CABG. Heart is normal size. No confluent opacities or effusions.
IMPRESSION: Lingular and left upper lobe scarring.

COPD.

No active disease.

## 2018-04-25 DIAGNOSIS — E041 Nontoxic single thyroid nodule: Secondary | ICD-10-CM

## 2018-04-26 ENCOUNTER — Telehealth: Payer: Self-pay | Admitting: Internal Medicine

## 2018-04-26 NOTE — Telephone Encounter (Signed)
Pt wanted to know if he could try Dexilant samples again before having a prescription called in. He was asking to speak with LSL about his stomach issues. [phone]

## 2018-04-26 NOTE — Telephone Encounter (Signed)
Spoke with pt. He wanted some Dexilant samples. Samples are ready for pickup.

## 2018-04-27 ENCOUNTER — Telehealth: Payer: Self-pay | Admitting: Cardiovascular Disease

## 2018-04-27 NOTE — Telephone Encounter (Signed)
Follow Up:    Pt would like Thyroid Scan results from 04-21-18 please.

## 2018-04-27 NOTE — Telephone Encounter (Signed)
Spoke with pt.pt aware of  Thyroid ultra sound results with verbal understanding. Notes recorded by Lonn Georgia, PA-C on 04/25/2018 at 11:44 AM EDT Please let him know that there is 1 nodule that needs to be followed up in a year. No biopsy or other testing needed at this time.  Just need to make sure nothing changes with time.  Results fwd to the pt pcp.

## 2018-05-09 ENCOUNTER — Encounter: Payer: Self-pay | Admitting: Pulmonary Disease

## 2018-05-09 ENCOUNTER — Ambulatory Visit (INDEPENDENT_AMBULATORY_CARE_PROVIDER_SITE_OTHER): Payer: Medicare Other | Admitting: Pulmonary Disease

## 2018-05-09 VITALS — BP 156/70 | HR 73 | Temp 97.6°F | Ht 68.0 in | Wt 160.4 lb

## 2018-05-09 DIAGNOSIS — I2510 Atherosclerotic heart disease of native coronary artery without angina pectoris: Secondary | ICD-10-CM

## 2018-05-09 DIAGNOSIS — R911 Solitary pulmonary nodule: Secondary | ICD-10-CM | POA: Diagnosis not present

## 2018-05-09 DIAGNOSIS — I10 Essential (primary) hypertension: Secondary | ICD-10-CM

## 2018-05-09 DIAGNOSIS — R1319 Other dysphagia: Secondary | ICD-10-CM

## 2018-05-09 DIAGNOSIS — R1310 Dysphagia, unspecified: Secondary | ICD-10-CM

## 2018-05-09 DIAGNOSIS — I739 Peripheral vascular disease, unspecified: Secondary | ICD-10-CM

## 2018-05-09 DIAGNOSIS — K219 Gastro-esophageal reflux disease without esophagitis: Secondary | ICD-10-CM

## 2018-05-09 DIAGNOSIS — J439 Emphysema, unspecified: Secondary | ICD-10-CM | POA: Diagnosis not present

## 2018-05-09 DIAGNOSIS — Z951 Presence of aortocoronary bypass graft: Secondary | ICD-10-CM | POA: Diagnosis not present

## 2018-05-09 DIAGNOSIS — C159 Malignant neoplasm of esophagus, unspecified: Secondary | ICD-10-CM

## 2018-05-09 DIAGNOSIS — I679 Cerebrovascular disease, unspecified: Secondary | ICD-10-CM

## 2018-05-09 MED ORDER — BUDESONIDE-FORMOTEROL FUMARATE 160-4.5 MCG/ACT IN AERO: INHALATION_SPRAY | RESPIRATORY_TRACT | 6 refills | Status: DC

## 2018-05-09 MED ORDER — TIOTROPIUM BROMIDE MONOHYDRATE 18 MCG IN CAPS: 18.0000 ug | ORAL_CAPSULE | Freq: Every day | RESPIRATORY_TRACT | 6 refills | Status: DC

## 2018-05-09 MED ORDER — ALBUTEROL SULFATE (2.5 MG/3ML) 0.083% IN NEBU: 2.5000 mg | INHALATION_SOLUTION | Freq: Four times a day (QID) | RESPIRATORY_TRACT | 12 refills | Status: DC | PRN

## 2018-05-09 MED ORDER — BUDESONIDE-FORMOTEROL FUMARATE 160-4.5 MCG/ACT IN AERO: 2.0000 | INHALATION_SPRAY | Freq: Two times a day (BID) | RESPIRATORY_TRACT | 0 refills | Status: DC

## 2018-05-09 NOTE — Patient Instructions (Signed)
Today we updated your med list in our EPIC system...    Continue your current medications the same...  Keep up the good work with your exercise program & the great job taking your meds regularly...  We discussed my up-coming retirement & I recommend that you follow up w/ my young (and much better looking) partner DrEllison in about 6 months...  Gregory Eaton,  It has been my great honor to have known you and to have been one of your doctors over these last few years...    Wishing you good luck & my best wishes for health & happiness in the years to come!!!

## 2018-05-09 NOTE — Progress Notes (Signed)
Subjective:     Patient ID: Gregory Eaton, male   DOB: 1945-10-31, 72 y.o.   MRN: [id number]  HPI 72 y/o WM ex-smoker w/ COPD/ emphysema (GOLD Stage2-3), 7-64m RLL nodule on CT Chest, CAD- s/p CABG 1998 w/ known carotid & periph vasc dis (s/p Fem-Pop bypass), hx esoph cancer- s/p [facility]..   ~  January 30, 2015:  Initial pulmonary consult w/ SN>   89y/o WM[place] to [place]/ emphysema/ & 7-82mRLL nodule on CT Chest>      ChRileeelates a history of sinus infection/ URI in April2016 & went to his PCP [place], treated w/ antibiotic & Pred=> improved but didn't get over it so he was given a second antibiotic + Spiriva, Proair, Singulair => finally improved so he tells me he stopped the Spiriva "I'm better"...  He is an ex-smoker, starting in his teens, smoked for 40 yrs up to 2ppd at max for a 55-60 pack-year smoking history... 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     He has a hx HBP, HL, CAD, s/p CABG in 1998, known carotid art dis & ASPVD w/ prev Fem-Pop bypass> followed by [facility] & his 10/07/14 Epic note is reviewed; meds adjusted, see below...    Hx esophageal cancer w/ esophagectomy & gastric pull through at [facility] 2010... EXAM revealed Afeb, VSS, O2sat=95% on RA;  HEENT- neg, mallampati2;  Chest- decr BS bilat w/o w/r/r;  Heart- RR gr1/6 SEM w/o r/g;  Abd- soft, neg;  Ext- w/o c/c/e...  CT Angio Chest 04/07/14 showed neg for PE, s/p CABG, severe atherosclerosis in Ao, severe bilat emphysema, 8mm66mdule RLL, inflamm changes in lingula, post surg changes related to esophagectomy & gastric pull thru, no signif LNs...  EKG 09/2014 showed NSR, rate64, RBBB, NAD...  [facility]  [facility]D/emphysema, unchanged 7mm 8m nodule, no new lesions, post-op changes after esophagectomy & gastric pull thru...  CXR 12/11/14 showed norm heart size, s/p CABG, COPD/ emphysema, NAD...   Spirometry 01/30/15 showed FVC=3.45 (82%), FEV1=1.76 (54%), %1sec=51, mid-flows reduced at 23% predicted;  Mod severe airflow obstruction w/ GOLD Stage 2-3 COPD....   Ambulatory oxygen saturation test 01/30/15> O2sat=96% on RA at rest w/ pulse=67/min;  He ambulated 3 laps in the office w/ nadir O2sat=93% w/ pulse=85/min...  LABS 7/16:  Alpha-1-Antitrypsin level ==> 133 (83-199 mg/dL) and phenotype= M1M2  IMP/PLAN>>  CharlEnochmod severe COPD/ emphysema based on his PFTs and heavy smoking hx; he would benefit from ICS/LABA and LAMA Rx=> rec to use SYMBICORT160-2spBid & SPIRIVA daily; he will continue PROAIR-HFA for acute symptoms prn, SINGULAIR10, and MUCINEX '600mg'$ Qid for thick phlegm/ mucus; he needs to maintain a vigorous antireflux regimen w/ his esoph dis & surg; as much as poss he will avoid infections and needs to start a vigorous exercise program (eg- silver sneakers or similar)... Prognosis is guarded w/ his signif co-morbidities (ASHD, ASPVD, Hx esoph ca w/ surg 2010, etc);  He will ret in 21mo w54mollPFTs to check DLCO...    FullPFT 04/03/15 showed FVC=4.22 (100%), FEV1=2.16 (70%), %1sec=51, mid-flows reduced at 36% predicted; post-bronchodil there was a 3% incr in FEV1; TLC=6.80 (100%),  RV=1.79 (75%), RV/TLC=26; DLCO=55% predicted... These PFTs indicate moderately severe airflow obstruction, GOLD Stage2-3 COPD, decr diffusion c/w emphysema...   CXR 12/02/15 by Gregory [facility]>  Norm heart size s/p CABG, mild hyperinflation, coarse interstitial markings, small HH w/ A/F level...   LABS from [facility] 12/2015>  FLP- all parameters at goals;  Chems- ok w/ Cr=0.74, BS=88;  CBC- wnl...   Spirometry 03/23/16>  FVC=4.10 (101%), FEV1=1.76 (59%), %1sec=43, mid-flows reduced at 20% predicted... This is c/w a severe  obstructive ventilatory defect & GOLD Stage 2 CPD...   ~  May 12, 2016:  7wk ROV & add-on appt s/p Hosp in [place] for Pneumonia>  Gregory Eaton reports that he was [place] w/ family when he became ill w/ chest congestion, SOB, fever to 101, chills, & weak w/ decr in BP; [facility] & sent to [facility] for 2d he says;  He had left lung pneumonia on CXR, WBC=14.8, Sput grew NTF only, BNP=119;  He was treated w/ Levaquin and slowly improved; [place] 3 [place] to rec[place] & now back home to [place] & feeling much better...    CXR 05/12/16>  Norm heart size, s/p CABG, hyperinflation w/ COPD, linear scarring in LUL & lingula, NAD...   IMP/PLAN>>  Gregory Eaton has made a nice recovery from his recent CAP 9/17 in [place]; he is off antibiotics and rec to continue his Symbicort160-2spBid, Spiriva daily, Singulair10, Mucinex 1241m Bid fluids, etc...  ~  July 27, 2016:  2-331moOV & ChCaylonas re-hospitalized 11/7 - 06/03/16 by [facility] w/ aspiration pneumonia & severe on-going reflux s/p esophageal cancer w/ esophagectomy & gastric pull through at [facility] 2010;  He was treated w/ IV Zosyn=> Augmentin, eval by speech Path, sent home w/ new home O2;  Since disch he has been seen by [facility]> HBP, CAD, s/pCABG1998, HL, [facility]/ occluded left [facility] art, mild carotid dis, s/p fem-pop bypass & occlusive right SFA dis;  Stable on ASA/Plavix, ToprolXL, Amlod, Losar, Crestor;  He was also eval post hosp by TP> improved but seen again 07/13/16 w/ incr cough, thick mucus, & CXR showed improved aeration/ resolving pneumonia (rec same inhalers + incr ?Mucinex, fluids, & Pred taper)...   CXR 07/13/16>  S/p CABG & atherosclerotic changes in Ao, background COPD/E w/ hyperinflation & scarring, marked improvement in RUL/RLL areas...  LABS 05/2016>  Chems- ok w/ BS~110-160;  CBC- ok x WBC~10-15K;   IMP/PLAN>>   He is reminded to use his inhalers regularly & encouraged to use the NEBULIZER as needed;  Needs to incr the Mucinex6004mo 2Bidf w/ fluids;  O2sats are all in the 90s on RA- [facility];  Rec to continue his exercise program 7 call for any set backs in his status... we plan ROV in 31mo26mo  October 27, 2016:  31mo 40mo& Rickardo reports a good interval- breathing is stable, no new complaints or concerns;  He notes min cough, scant intermit sput (clear), no hemoptysis, chr stable SOB/DOE w/ activities like rushing about, ok w/ ADLs, he's been painting a room, somewhat lim by LBP;  He denies any swallowing difficulty- eating well, maintaining wt, no reflux/ regurg/ dysphagia/ n/v etc...  We reviewed the following medical problems during today's office visit>     COPD/ emphysema ==> GOLD Stage 2-3 disease w/ severe bilat emphysema on CT &  normal A1AT level & phenotypeM1M2>  On Symbicort160- 2spBid, Spiriva daily, Singulair10, NEBS w/ Albut Qid prn & he hasn't needed this in months...    7-129m RLL pulmonary nodule on CT Chest>  No change noted from 2015=>2016 ([facility]- they felt [facility] was [facility]) & serial CXRs w/o nodules seen (Last CXR 12/17 showed s/p CABG & atherosclerotic changes in Ao, background COPD/E w/ hyperinflation & scarring...    HBP>  [place] Toprol[place], Amlod5, Cozaar100; BP=140/72 & he denies CP, palpit, SOB, edema...    CAD- s/p 5 vessel CABG, RBBB>  On ASA/ Plavix; [facility]-     Carotid stenosis & occluded left vertebral>  [place] bulbs and 0-39% R-ICA stenosis and 50-69% L-ICA stenosis; he has a bruit on the right side on exam...    ASPVD>  s/p right Fem-Pop in 1993;  Last LE arterial dopplers 06/2015 showed stable patent left Fem-Pop bypass graft with irregularities & three vessel run-off on the left; ABIs= 0.99-right & 1.1-left...    [facility]  [facility]- [facility]>  [facility] GI is DrRourk, [facility] but no CareEverywhere records are avail...    Other medical issues as noted>  See below... EXAM revealed Afeb, VSS, O2sat=96% on RA;  HEENT- neg, mallampati2;  Chest- decr BS bilat w/o w/r/r;  Heart- RR gr1/6 SEM w/o r/g;  Abd- soft, neg;  Ext- w/o c/c/e;  Neuro- intact... IMP/PLAN>>  CHobieis stable w/ his severe cardiovasc & pulmonary issues as above;  He will continue the same meds for now &is encouraged to exercise as best he can;  He is up-to-date on vaccinations and he will call prn any problems w/ rov planned in 667mo.  ~  May 10, 2017:  6 month ROTaboreports a good interval, breathing well, rarely notes any wheezing, and min cough, no signif sput, SOB/DOE w/o change (ADLs ok, uses ex-bike, more lim by hip pain); denies f/c/s, no CP/ palpit/ edema; PCP= DrJohn Z Farrier & seen about 3-29m61moo he says;  On Advair250Bid, Spiriva daily, Singulair10, Albut NEB + HFA as needed... We reviewed the following medical problems during today's office visit>      COPD/ emphysema ==> GOLD Stage 2-3 disease w/ severe bilat emphysema on CT & normal A1AT level & phenotypeM1M2>  On Symbicort160- 2spBid, Spiriva daily, Singulair10, NEBS w/ Albut Qid prn & he hasn't needed this in months...    7-8mm329mL pulmonary nodule on CT Chest>  No change noted from 2015=>2016 ([facility]- they felt it was benign) & serial CXRs w/o nodules seen (Last CXR 12/17 showed s/p CABG & atherosclerotic changes in Ao, background COPD/E w/ hyperinflation & scarring...    HBP>  [place], Amlod5, Cozaar100; BP=140/72 & he denies CP, palpit, SOB, edema...    CAD- s/p 5 vessel CABG, RBBB>  On ASA/ Plavix; [facility] & last seen 01/2017 and note reviewed...    Carotid stenosis & occluded left vertebral>  [place] and 0-39% R-ICA stenosis and 50-69% L-ICA stenosis; he has a bruit on the right side on exam...    ASPVD>  s/p right Fem-Pop in 1993;  Last LE arterial dopplers 06/2015 showed stable patent left Fem-Pop bypass  graft with irregularities & three vessel run-off on the left; ABIs= 0.99-right & 1.1-left...    Hx esophageal cancer- s/p esophagectomy w/ [facility]>  His GI is DrRourk, [facility] and [facility] but no CareEverywhere records are avail...    Other medical issues as noted>  See below... EXAM revealed Afeb, VSS, O2sat=96% on RA;  HEENT- neg, mallampati2;  Chest- decr BS bilat w/o w/r/r;  Heart- RR gr1/6 SEM w/o r/g;  Abd- soft, neg;  Ext- w/o c/c/e;  Neuro- intact...  CXR 05/09/17 (independently reviewed by me in the PACS system) showed norm heart size, s/p CABG, underlying COPD, chr changes w/ scarring at left base- NAD...  IMP/PLAN>>  We reviewed Yerachmiel' medical issues- rec to continue same rx + exercise; given 2018 Flu vaccine and we discussed ZPak to keep on hand for prn use; we plan rov recheck in 85mo sooner if needed for acute problems...   ~  November 07, 2017:  654moOV & Monica reports a good interval & denies new complaints or concerns... He denies much cough, sputum, no hemoptysis, chr stable DOE if rushing about or on hills, exercises w/ yard work/ walking/ etc & denies CP/ palpit/ edema/ etc...    He had GI follow up w/ DrRourk, [facility] w/ EGD and Colonoscopy 10/24/17>  EGD showed s/p esophagectomy w/ remnant esoph, anastomosis w/ stomach at 24cm, 2cm segment of Barrett's epithelium but no dysplasia or malig on Bx;  Colon showed 1142molyp at the ileocecal valve (Bx=tub adenoma), divertics, no other lesions...  REC to continue PPI Bid... We reviewed the following medical problems during today's office visit>      COPD/ emphysema ==> GOLD Stage 2-3 disease w/ severe bilat emphysema on CT &  normal A1AT level & phenotypeM1M2>  On Symbicort160- 2spBid, Spiriva daily, Singulair10, NEBS w/ Albut Qid prn & he hasn't needed this in months...    7-8mm88mL pulmonary nodule on CT Chest>  No change noted from 2015=>2016 ([facility]- they felt it was benign) & serial CXRs w/o nodules seen (Last CXR 10/18 showed s/p CABG & atherosclerotic changes in Ao, background COPD/E w/ hyperinflation & scarring- NAD...    HBP>  [place], Amlod5, Cozaar100; BP=140/80 & he denies CP, palpit, SOB, edema...    CAD- s/p 5 vessel CABG, RBBB>  On ASA/ Plavix; [facility] & last seen 01/2017 & note reviewed...     Carotid stenosis & occluded left vertebral>  Last CDopplers done 03/01/17 showed heterogen plaque bilat and 0-39% R-ICA stenosis and 40-59% L-ICA stenosis; patent right vertebral & occluded left vertebral...    ASPVD>  s/p right Fem-Pop in 1993;  Last LE arterial dopplers 11/11/16 showed stable patent left Fem-Pop bypass graft with irregularities & three vessel run-off on the left; ABIs= 0.93-right & non-compressible vessels on left; TBIs= .67R & .46L...    Hx esophageal cancer- s[facility]>  His GI is DrRourk, [facility] and [facility] but no CareEverywhere records are avail;  [place]    Other medical issues as noted>  See below... EXAM revealed Afeb, VSS, O2sat=99% on RA;  HEENT- neg, mallampati2;  Chest- decr BS bilat w/o w/r/r;  Heart- RR gr1/6 SEM w/o r/g;  Abd- soft, neg;  Ext- w/o c/c/e;  Neuro- intact...  LABS  10/2017 in Epic>  Chems- ok x BS=142;  CBC- wnl w/ Hg=14.6 IMP/PLAN>>  Serjio is stable w/ his mult medical issues- from the pulmonary standpoint he remains stable on his Symbicort/ Spiriva regularly, and NEBS prn; no interval infections but he needs to remain diligent regarding his antireflux regimen s/p esoph surgery in 2010... We plan pulm  follow up visit in 63mo sooner if needed prn...   ~  May 09, 2018:  6 month ROV & pulmonary follow up visit>         Past Medical History  Diagnosis Date  . Esophageal carcinoma 03/2009    T1N1M0  . Hypertension >> on Metoprolol, Amlodipine, Losartan   . Barrett's esophagus   . GERD (gastroesophageal reflux disease) >> on Protonix40 & Carafate 1gmQid   . CAD (coronary artery disease)   . PVD (peripheral vascular disease)   . Hyperlipidemia >> on Crestor10, Fish Oil + diet   . Adenomatous colon polyp 03/2009    Last colonoscopy by Dr. RGala Romney  . Diverticulosis   . Tobacco abuse   . Tachyarrhythmia 1999    [facility] . COPD (chronic obstructive pulmonary disease)     Severe emphysema per CT  . History of echocardiogram 08/27/2009    EF >55%  . History of nuclear stress test 11/24/2011    lexiscan; normal perfusion; low risk scan; non-diagnostic for ischemia  . History of Doppler ultrasound 11/09/2011    03/2014- 50-69% L ICA stenosis;carotid doppler; L bulb/prox ICA 0-49% diameter reduction; L vertebral artery - occlusive ds; L ECA  demonstrates severe amount of fibrous plaque  . History of Doppler ultrasound 11/09/11    LEAs; R ABI - mod art. insuff.; L ABI normal at rest; R SFA - occlusive ds; L SFA - occlusive ds; patent fem-pop graft  . Adenomatous polyp of colon 11/03/2010  . Left carotid artery stenosis 04/08/2014  . Pulmonary nodule, right 04/08/2014    8 mm-incidental finding on CT  . DM type 2 (diabetes mellitus, type 2) >> on diet alone 04/09/2014    A1c 6.5 (04/08/14)     Past Surgical History:  Procedure Laterality Date  . BIOPSY  10/24/2017   Procedure: BIOPSY;  Surgeon: RDaneil Dolin MD;  Location: [facility];  Service: Endoscopy;;  esophagus  . COLONOSCOPY  03/17/2009   Dr.Rourk- normal rectum, sigmoid diverticula, some pale sigmoid mucosa with diffuse petechiae. pedunculated polyp at the splenic flexure, remainder of colonic mucosa appeared  normal. bx= adenomatous polyp  . COLONOSCOPY  04/19/2003   Dr.Rehman- few diverticu;a at the sigmoid colon, 3 small polyps, one at the transverse colon and 2 at the sigmoid, small external hemorrhoids. bx report not available.   . COLONOSCOPY WITH PROPOFOL N/A 10/24/2017   Dr. RGala Romney Diverticulosis, 11 mm polyp at the ileocecal valve, tubular adenoma.  Repeat colonoscopy in 3 years  . CORONARY ARTERY BYPASS GRAFT  1998   [name]  . [facility]  [facility] . ESOPHAGOGASTRODUODENOSCOPY  11/25/2010   Dr.Rourk- s/p esophagectomy with gastric pull-up, esophageal erosions straddling the surgical anastomosis, salmon colored epithelium coming up a good centimeter to a centimeter and a half above the suture line, islands of salmon colored epithelium in the most poximal residual esophagus, remainder of gastric mucosa appeared normal. bx= swamous &gastric glandular mucosa w/chronic active inflammation  . ESOPHAGOGASTRODUODENOSCOPY  03/17/2009   Dr.Rourk- 4cm segment of salmon-colored epithlium distal esophagus suspicious for barretts esophagus. area  of suspicious nodularity w/in this segment bx seperately. small to moderate size hiatal hernia, o/w normal stomach D1 and D2 bx=adenocarcinoma  . ESOPHAGOGASTRODUODENOSCOPY (EGD) WITH PROPOFOL N/A 10/24/2017   Dr. [name]: Remnant of esophagus with anastomosis with stomach at 24 cm from the incisors, 2 cm above the anastomosis he was found to have Barrett's but no dysplasia.  Advised for repeat EGD in 3 years  . FEMORAL-POPLITEAL BYPASS GRAFT  1993   occlusive ds in R SFA  . GASTRIC PULL THROUGH  2010   With esophagectomy  . LIPOMA EXCISION  2010  . POLYPECTOMY  10/24/2017   Procedure: POLYPECTOMY;  Surgeon: [name], MD;  Location: [facility];  Service: Endoscopy;;  colon     Outpatient Encounter Medications as of 05/09/2018  Medication Sig  . acetaminophen (TYLENOL) 500 MG tablet Take 500 mg by mouth daily as needed for  headache.  . albuterol (PROVENTIL) (2.5 MG/3ML) 0.083% nebulizer solution Take 3 mLs (2.5 mg total) by nebulization every 6 (six) hours as needed for wheezing or shortness of breath.  . ALPRAZolam (XANAX) 0.5 MG tablet Take 1 tablet (0.5 mg total) by mouth at bedtime. (Patient taking differently: Take 0.5 mg by mouth at bedtime as needed. )  . amLODipine (NORVASC) 5 MG tablet TAKE ONE (1) TABLET BY MOUTH EVERY DAY  . aspirin 81 MG EC tablet Take 81 mg by mouth at bedtime.   . brimonidine (ALPHAGAN) 0.2 % ophthalmic solution Place 1 drop into both eyes at bedtime.   . budesonide-formoterol (SYMBICORT) 160-4.5 MCG/ACT inhaler INHALE TWO PUFFS INTO THE LUNGS TWO TIMES DAILY  . clopidogrel (PLAVIX) 75 MG tablet TAKE ONE (1) TABLET BY MOUTH EVERY DAY  . dexlansoprazole (DEXILANT) 60 MG capsule Take 30 minutes daily before evening meal.  . guaiFENesin (MUCINEX PO) Take 1,200 mg by mouth every 12 (twelve) hours as needed.  . latanoprost (XALATAN) 0.005 % ophthalmic solution Place 1 drop into both eyes at bedtime.   Marland Kitchen losartan (COZAAR) 100 MG tablet Take 1 tablet (100 mg total) by mouth at bedtime.  . metoprolol succinate (TOPROL-XL) 50 MG 24 hr tablet TAKE ONE (1) TABLET BY MOUTH EVERY DAY  . montelukast (SINGULAIR) 10 MG tablet Take 10 mg by mouth daily.   . Multiple Vitamins-Minerals (CENTRUM SILVER PO) Take 1 tablet by mouth daily.    . nitroGLYCERIN (NITROSTAT) 0.4 MG SL tablet Place 1 tablet (0.4 mg total) under the tongue every 5 (five) minutes as needed for chest pain.  . ranitidine (ZANTAC) 150 MG tablet Take 150 mg by mouth daily as needed for heartburn.   . rosuvastatin (CRESTOR) 10 MG tablet TAKE ONE (1) TABLET BY MOUTH EVERY DAY  . sucralfate (CARAFATE) 1 G tablet Take 1 tablet (1 g total) by mouth 4 (four) times daily -  with meals and at bedtime. (Patient taking differently: Take 1 g by mouth 4 (four) times daily -  with meals and at bedtime. As needed)  . tiotropium (SPIRIVA) 18 MCG  inhalation capsule Place 1 capsule (18 mcg total) into inhaler and inhale daily.  . [DISCONTINUED] albuterol (PROVENTIL) (2.5 MG/3ML) 0.083% nebulizer solution Take 3 mLs (2.5 mg total) by nebulization every 6 (six) hours as needed for wheezing or shortness of breath. Patient will also need nebulizer machine please  . [DISCONTINUED] budesonide-formoterol (SYMBICORT) 160-4.5 MCG/ACT inhaler INHALE TWO PUFFS INTO THE LUNGS TWO TIMES DAILY  . [DISCONTINUED] tiotropium (SPIRIVA) 18 MCG inhalation capsule Place 18 mcg into inhaler and inhale daily.  Marland Kitchen  budesonide-formoterol (SYMBICORT) 160-4.5 MCG/ACT inhaler Inhale 2 puffs into the lungs 2 (two) times daily.  Marland Kitchen omeprazole (PRILOSEC) 40 MG capsule Take 40 mg by mouth daily.   No facility-administered encounter medications on file as of 05/09/2018.     Allergies  Allergen Reactions  . Altace [Ramipril] Cough    Immunization History  Administered Date(s) Administered  . Influenza, High Dose Seasonal PF 05/09/2017, 04/21/2018  . Influenza,inj,Quad PF,6+ Mos 04/19/2015, 04/17/2016  . Influenza-Unspecified 05/01/2014  . Pneumococcal Conjugate-13 04/01/2014  . Pneumococcal Polysaccharide-23 04/17/2016    Current Medications, Allergies, Past Medical History, Past Surgical History, Family History, and Social History were reviewed in [employer] record.   Review of Systems            All symptoms NEG except where BOLDED >>  Constitutional:  F/C/S, fatigue, anorexia, unexpected weight change. HEENT:  HA, visual changes, hearing loss, earache, nasal symptoms, sore throat, mouth sores, hoarseness. Resp:  cough, sputum, hemoptysis; SOB, tightness, wheezing. Cardio:  CP, palpit, DOE, orthopnea, edema. GI:  N/V/D/C, blood in stool; reflux, abd pain, distention, gas. GU:  dysuria, freq, urgency, hematuria, flank pain, voiding difficulty. MS:  joint pain, swelling, tenderness, decr ROM; neck pain, back pain, etc. Neuro:  HA,  tremors, seizures, dizziness, syncope, weakness, numbness, gait abn. Skin:  suspicious lesions or skin rash. Heme:  adenopathy, bruising, bleeding. Psyche:  confusion, agitation, sleep disturbance, hallucinations, anxiety, depression suicidal.   Objective:   Physical Exam      Vital Signs:  Reviewed...   General:  WD, WN, 72 y/o WM in NAD; alert & oriented; pleasant & cooperative... HEENT:  [place]/AT; Conjunctiva- pink, Sclera- nonicteric, EOM-wnl, PERRLA, EACs-clear, TMs-wnl; NOSE-clear; THROAT-clear & wnl.  Neck:  Supple w/ fair ROM; no JVD; normal carotid impulses w/o bruits; no thyromegaly or nodules palpated; no lymphadenopathy.  Chest:  decr BS bilat, without wheezes, rales, or rhonchi heard. Heart:  Regular Rhythm; gr1/6 SEM w/o rubs or gallops detected. Abdomen:  Soft & nontender- no guarding or rebound; normal bowel sounds; no organomegaly or masses palpated. Ext:  decrROM; without deformities +arthritic changes; no varicose veins, +venous insuffic, or edema;  Pulses intact w/o bruits. Neuro:  No focal neuro deficits; sensory testing normal; gait normal & balance OK. Derm:  No lesions noted; no rash etc. Lymph:  No cervical, supraclavicular, axillary, or inguinal adenopathy palpated.   Assessment:      IMP >>     COPD/ emphysema ==> GOLD Stage 2-3 disease    7-9m RLL pulmonary nodule on CT Chest    [place]    HBP    CAD- s/p 5 vessel CABG    Carotid stenosis    ASPVD- s/p Fem-Pop    [facility]    Other medical issues as noted...   PLAN >>  01/30/15>  CAntionohas mod severe COPD/ emphysema based on his PFTs and heaqvy smoking hx; he would benefit from ICS/LABA and LAMA Rx=> rec to use SYMBICORT160-2spBid & SPIRIVA daily; he will continue PROAIR-HFA for acute symptoms prn, SINGULAIR10, and MUCINEX '600mg'$ Qid for thick phlegm/ mucus;; he needs to maintain a  vigorous antireflux regimen w/ his esoph dis & surg; as much as poss he will avoid infections and needs to start a vigorous exercise program (eg- silver sneakers or similar)... Prognosis is guarded w/ his signif co-morbidities (ASHD, ASPVD, Hx esoph ca w/ surg  2010, etc);  He will ret in 42mow/ FullPFTs to check DLCO. 04/03/15>  As prev noted- CKenithis rec to stay on the Advair,Spiriva, Proair; avoid infections, get all approp vaccinations from his PCP; follow the vigorous antireflux regimen as outlined, and plan f/u w/ uKoreain about 478mosooner if needed. 09/18/15>   I again explained to Gregory Eaton need for regular ICS/LABA medication- he needs to contact his insurance regarding a their prescription drug formulary, in the meanwhile we will refill the Symbicort160- 2spBid;  Rx written for ZPak w/ refills per his request. 05/12/16>   [place] [facility], treated w/ Levaquin & made a slow but steady recovery; again requested to continue regular Rx w/ his Symbicort160-2spBid, Spiriva daily, Singulair10, Mucinex '1200mg'$  Bid fluids, etc 11/7 - 06/03/16>  Hosp by Gregory Eaton w/ asp pneumonia & severe on-going reflux s/p [facility]; he responded to antibiotics w/ Zosyn=> Augmentin, and CXR resolved over the next month... 07/27/16>  He is back to baseline, & reminded to use his inhalers regularly & encouraged to use the NEBULIZER as needed;  Needs to incr the Mucinex'600mg'$  to 2Bidf w/ fluids;  O2sats are all in the 90s on RA- he has the O2 from the [facility] prn use;  Rec to continue his exercise program & call for any set backs in his status. 10/27/16>   Truong is stable w/ his severe cardiovasc & pulmonary issues as above;  He will continue the same meds for now &is encouraged to exercise as best he can;  He is up-to-date on vaccinations and he will call prn any problems w/ rov planned in 4m25mo0/23/18>   We  reviewed ChaKymiredical issues- rec to continue same rx + exercise; given 2018 Flu vaccine and we discussed ZPak to keep on hand for prn use; we plan rov recheck in 4mo109mooner if needed for acute problems. 11/07/17>   Antoni is stable w/ his mult medical issues- from the pulmonary standpoint he remains stable on his Symbicort/ Spiriva regularly, and NEBS prn; no interval infections but he needs to remain diligent regarding his antireflux regimen s/p esoph surgery in 2010... We plan pulm follow up visit in 4mo,47moner if needed prn   Plan:       Patient's Medications  New Prescriptions   BUDESONIDE-FORMOTEROL (SYMBICORT) 160-4.5 MCG/ACT INHALER    Inhale 2 puffs into the lungs 2 (two) times daily.  Previous Medications   ACETAMINOPHEN (TYLENOL) 500 MG TABLET    Take 500 mg by mouth daily as needed for headache.   ALPRAZOLAM (XANAX) 0.5 MG TABLET    Take 1 tablet (0.5 mg total) by mouth at bedtime.   AMLODIPINE (NORVASC) 5 MG TABLET    TAKE ONE (1) TABLET BY MOUTH EVERY DAY   ASPIRIN 81 MG EC TABLET    Take 81 mg by mouth at bedtime.    BRIMONIDINE (ALPHAGAN) 0.2 % OPHTHALMIC SOLUTION    Place 1 drop into both eyes at bedtime.    CLOPIDOGREL (PLAVIX) 75 MG TABLET    TAKE ONE (1) TABLET BY MOUTH EVERY DAY   DEXLANSOPRAZOLE (DEXILANT) 60 MG CAPSULE    Take 30 minutes daily before evening meal.   GUAIFENESIN (MUCINEX PO)    Take 1,200 mg by mouth every 12 (twelve) hours as needed.   LATANOPROST (XALATAN) 0.005 % OPHTHALMIC SOLUTION  Place 1 drop into both eyes at bedtime.    LOSARTAN (COZAAR) 100 MG TABLET    Take 1 tablet (100 mg total) by mouth at bedtime.   METOPROLOL SUCCINATE (TOPROL-XL) 50 MG 24 HR TABLET    TAKE ONE (1) TABLET BY MOUTH EVERY DAY   MONTELUKAST (SINGULAIR) 10 MG TABLET    Take 10 mg by mouth daily.    MULTIPLE VITAMINS-MINERALS (CENTRUM SILVER PO)    Take 1 tablet by mouth daily.     NITROGLYCERIN (NITROSTAT) 0.4 MG SL TABLET    Place 1 tablet (0.4 mg total) under the  tongue every 5 (five) minutes as needed for chest pain.   OMEPRAZOLE (PRILOSEC) 40 MG CAPSULE    Take 40 mg by mouth daily.   RANITIDINE (ZANTAC) 150 MG TABLET    Take 150 mg by mouth daily as needed for heartburn.    ROSUVASTATIN (CRESTOR) 10 MG TABLET    TAKE ONE (1) TABLET BY MOUTH EVERY DAY   SUCRALFATE (CARAFATE) 1 G TABLET    Take 1 tablet (1 g total) by mouth 4 (four) times daily -  with meals and at bedtime.  Modified Medications   Modified Medication Previous Medication   ALBUTEROL (PROVENTIL) (2.5 MG/3ML) 0.083% NEBULIZER SOLUTION albuterol (PROVENTIL) (2.5 MG/3ML) 0.083% nebulizer solution      Take 3 mLs (2.5 mg total) by nebulization every 6 (six) hours as needed for wheezing or shortness of breath.    Take 3 mLs (2.5 mg total) by nebulization every 6 (six) hours as needed for wheezing or shortness of breath. Patient will also need nebulizer machine please   BUDESONIDE-FORMOTEROL (SYMBICORT) 160-4.5 MCG/ACT INHALER budesonide-formoterol (SYMBICORT) 160-4.5 MCG/ACT inhaler      INHALE TWO PUFFS INTO THE LUNGS TWO TIMES DAILY    INHALE TWO PUFFS INTO THE LUNGS TWO TIMES DAILY   TIOTROPIUM (SPIRIVA) 18 MCG INHALATION CAPSULE tiotropium (SPIRIVA) 18 MCG inhalation capsule      Place 1 capsule (18 mcg total) into inhaler and inhale daily.    Place 18 mcg into inhaler and inhale daily.  Discontinued Medications   No medications on file

## 2018-05-24 DIAGNOSIS — M25552 Pain in left hip: Secondary | ICD-10-CM | POA: Diagnosis not present

## 2018-06-02 DIAGNOSIS — H9201 Otalgia, right ear: Secondary | ICD-10-CM | POA: Diagnosis not present

## 2018-06-12 ENCOUNTER — Telehealth: Payer: Self-pay | Admitting: Internal Medicine

## 2018-06-12 MED ORDER — DEXLANSOPRAZOLE 60 MG PO CPDR: 60.0000 mg | DELAYED_RELEASE_CAPSULE | Freq: Every day | ORAL | 11 refills | Status: DC

## 2018-06-12 NOTE — Telephone Encounter (Signed)
Pt tried samples of Dexilant and is needing a prescription sent into [facility].

## 2018-06-12 NOTE — Telephone Encounter (Signed)
rx done

## 2018-06-12 NOTE — Telephone Encounter (Signed)
Pt called to report that Dexilant is working well and he would like an RX called into his pharmacy.

## 2018-07-05 ENCOUNTER — Ambulatory Visit: Payer: Medicare Other | Admitting: Cardiovascular Disease

## 2018-07-05 ENCOUNTER — Encounter: Payer: Self-pay | Admitting: Cardiovascular Disease

## 2018-07-05 ENCOUNTER — Encounter (INDEPENDENT_AMBULATORY_CARE_PROVIDER_SITE_OTHER): Payer: Self-pay

## 2018-07-05 VITALS — BP 164/72 | HR 72 | Ht 68.0 in | Wt 155.6 lb

## 2018-07-05 DIAGNOSIS — I739 Peripheral vascular disease, unspecified: Secondary | ICD-10-CM | POA: Diagnosis not present

## 2018-07-05 DIAGNOSIS — I10 Essential (primary) hypertension: Secondary | ICD-10-CM

## 2018-07-05 DIAGNOSIS — E785 Hyperlipidemia, unspecified: Secondary | ICD-10-CM

## 2018-07-05 DIAGNOSIS — Z951 Presence of aortocoronary bypass graft: Secondary | ICD-10-CM

## 2018-07-05 DIAGNOSIS — I6522 Occlusion and stenosis of left carotid artery: Secondary | ICD-10-CM

## 2018-07-05 DIAGNOSIS — I2510 Atherosclerotic heart disease of native coronary artery without angina pectoris: Secondary | ICD-10-CM | POA: Diagnosis not present

## 2018-07-05 MED ORDER — AMLODIPINE BESYLATE 10 MG PO TABS: 10.0000 mg | ORAL_TABLET | Freq: Every day | ORAL | 3 refills | Status: DC

## 2018-07-05 NOTE — Progress Notes (Signed)
Patient ID: Gregory Eaton, male   DOB: 10-20-1945, 72 y.o.   MRN: [id number]   PCP: Dr. Wende Neighbors  HPI: Gregory Eaton is a 72 y.o. male presents to the office today for a 64 month cardiology evaluation.  Gregory Eaton has CAD and underwent CABG revascularization surgery by Dr. Nils Pyle in 1998. In December 2010 he underwent esophageal cancer surgery at [facility]. Additional problems include hypertension as well as hyperlipidemia in addition to peripheral vascular disease. He has documented occluded left vertebral artery with normal antegrade flow the left external carotid and did have mild carotid stenoses are noted on his last Doppler study in detecting April 2013. He also is status post femoropopliteal bypass surgery and has occlusive disease in his right SFA.   A followup nuclear perfusion study on 07/24/2013 continued to reveal normal perfusion without scar or ischemia, 17 years after his CABG revascularization surgery. He had also undergone followup carotid studies which again showed an occluded left vertebral artery and mild disease in his right and left internal carotid system with narrowings less than 49%. He also is status post femoropopliteal bypass surgery. His last LE Doppler study from 07/10/2013 showed ABIs of 1.4 bilaterally at his ankles. He did have occlusive disease with reconstitution above the knee and the right SFA. His ABI values were now noncompressible.  Laboratory in August 2015 by his primary physician revealed mildly increased SGPT at 71 and an elevated calcium of 11.6.  He was hospitalized in September 2015 with pneumonia.  Followup calcium level was normal at 9.2.  LFTs were normal.  He did have a followup carotid duplex scan, which again showed a 50-69% stenosis of the left internal carotid artery.  Additionally, there was preocclusive stenosis and left external carotid.  He was hospitalized from November 7 - June 03, 2016 and was felt to have sepsis due to  pneumonia.  He initially was treated with vancomycin and Zosyn. He has a history of remote esophageal cancer and is status post surgery.  He has GERD which has been controlled with Protonix.    Whe I saw him in April 2018 he denied any recurrent chest pain. He saw Dr. Lenna Gilford  In 2018 for his COPD/emphysema.  There is no change in his prior right lower lobe pulmonary nodules.  He underwent lower extremity Doppler studies in April 2018, which revealed his known SFA disease.  The right ABI was within normal limits with abnormal waveforms.  The left ABI was not obtainable due to noncompressible vessels.  Bilateral great toe pressures were abnormal.  He denies any change in his claudication.  He denies any chest pain and continues to be on dual antiplatelet therapy.  He was on amlodipine 5 mg, losartan 75 mg, Toprol 50 mg for hypertension.  Losartan was titrated to 100 mg.  He was on Spiriva and  Singulair therapy for COPD.  He wason crestor for hyperlipidemia.   Since I last saw him in July 2018 he has continued to do relatively well but his blood pressure has been elevated.  He underwent  follow-up  lower extremity Dopplers in April 2019.  His right ABI was slightly increased from 1 year previously and his left ABIs were unchanged.  He saw [name], [place] in June 2019 and at that time he recommended that he switch taking losartan at bedtime but continue with amlodipine 5 mg and Toprol 50 mg in the morning.  In September 2019 he underwent follow-up carotid studies which  showed 1 to 39% stenoses bilaterally in the internal carotids.  There was nonhemodynamically significant plaque less than 50% in the right common carotid.  The right vertebral artery demonstrated antegrade flow.  The left vertebral artery was not visualized.  Incidentally, he was noted to have 3 vascular and heterogeneous areas in the left thyroid. Presently, he denies chest pain.  His blood pressures have been elevated.  He is to see his primary  physician Dr. Wende Neighbors in January and repeat laboratory will be obtained.  He is unaware of any PND orthopnea.  He has reflux.  He presents for reevaluation.   Past Medical History:  Diagnosis Date  . Adenomatous colon polyp 03/2009   Last colonoscopy by Dr. [name]   . Adenomatous polyp 2010  . Adenomatous polyp of colon 11/03/2010  . Arthritis   . Barrett's esophagus   . CAD (coronary artery disease)   . COPD (chronic obstructive pulmonary disease) (HCC)    Severe emphysema per CT  . Diverticulosis   . DM type 2 (diabetes mellitus, type 2) ([place]) 04/09/2014   A1c 6.5 (04/08/14)   . Esophageal carcinoma ([place]) 03/2009   T1N1M0  . GERD (gastroesophageal reflux disease)   . Glaucoma   . History of Doppler ultrasound 11/09/2011   03/2014- 50-69% L ICA stenosis;carotid doppler; L bulb/prox ICA 0-49% diameter reduction; L vertebral artery - occlusive ds; L ECA  demonstrates severe amount of fibrous plaque  . History of Doppler ultrasound 11/09/11   LEAs; R ABI - mod art. insuff.; L ABI normal at rest; R SFA - occlusive ds; L SFA - occlusive ds; patent fem-pop graft  . History of echocardiogram 08/27/2009   EF >55%  . History of kidney stones   . History of nuclear stress test 11/24/2011   lexiscan; normal perfusion; low risk scan; non-diagnostic for ischemia  . Hyperlipidemia   . Hypertension   . Left carotid artery stenosis 04/08/2014  . Pulmonary nodule, right 04/08/2014   2.8 mm-incidental finding on CT  . PVD (peripheral vascular disease) ([place])   . Tachyarrhythmia 1999   Status post ablation at [facility]  . Tobacco abuse     Past Surgical History:  Procedure Laterality Date  . BIOPSY  10/24/2017   Procedure: BIOPSY;  Surgeon: [name], MD;  [facility];  Service: Endoscopy;;  esophagus  . COLONOSCOPY  03/17/2009   Dr.Rourk- normal rectum, sigmoid diverticula, some pale sigmoid mucosa with diffuse petechiae. pedunculated polyp at the splenic flexure, remainder of colonic  mucosa appeared normal. bx= adenomatous polyp  . COLONOSCOPY  04/19/2003   Dr.Rehman- few diverticu;a at the sigmoid colon, 3 small polyps, one at the transverse colon and 2 at the sigmoid, small external hemorrhoids. bx report not available.   . COLONOSCOPY WITH PROPOFOL N/A 10/24/2017   Dr. [name]: Diverticulosis, 11 mm polyp at the ileocecal valve, tubular adenoma.  Repeat colonoscopy in 3 years  . CORONARY ARTERY BYPASS GRAFT  1998   [name]  . ESOPHAGECTOMY  2010   [facility] Dr. [name]  . ESOPHAGOGASTRODUODENOSCOPY  11/25/2010   Dr.Rourk- s/p esophagectomy with gastric pull-up, esophageal erosions straddling the surgical anastomosis, salmon colored epithelium coming up a good centimeter to a centimeter and a half above the suture line, islands of salmon colored epithelium in the most poximal residual esophagus, remainder of gastric mucosa appeared normal. bx= swamous &gastric glandular mucosa w/chronic active inflammation  . ESOPHAGOGASTRODUODENOSCOPY  03/17/2009   Dr.Rourk- 4cm segment of salmon-colored epithlium distal  esophagus suspicious for barretts esophagus. area of suspicious nodularity w/in this segment bx seperately. small to moderate size hiatal hernia, o/w normal stomach D1 and D2 bx=adenocarcinoma  . ESOPHAGOGASTRODUODENOSCOPY (EGD) WITH PROPOFOL N/A 10/24/2017   Dr. [name]: Remnant of esophagus with anastomosis with stomach at 24 cm from the incisors, 2 cm above the anastomosis he was found to have Barrett's but no dysplasia.  Advised for repeat EGD in 3 years  . FEMORAL-POPLITEAL BYPASS GRAFT  1993   occlusive ds in R SFA  . GASTRIC PULL THROUGH  2010   With esophagectomy  . LIPOMA EXCISION  2010  . POLYPECTOMY  10/24/2017   Procedure: POLYPECTOMY;  Surgeon: [name], MD;  Location: [facility];  Service: Endoscopy;;  colon     Allergies  Allergen Reactions  . Altace [Ramipril] Cough    Current Outpatient Medications  Medication Sig Dispense Refill  .  acetaminophen (TYLENOL) 500 MG tablet Take 500 mg by mouth daily as needed for headache.    . albuterol (PROVENTIL) (2.5 MG/3ML) 0.083% nebulizer solution Take 3 mLs (2.5 mg total) by nebulization every 6 (six) hours as needed for wheezing or shortness of breath. 75 mL 12  . ALPRAZolam (XANAX) 0.5 MG tablet Take 1 tablet (0.5 mg total) by mouth at bedtime. (Patient taking differently: Take 0.5 mg by mouth at bedtime as needed. ) 30 tablet 0  . amLODipine (NORVASC) 10 MG tablet Take 1 tablet (10 mg total) by mouth daily. 90 tablet 3  . aspirin 81 MG EC tablet Take 81 mg by mouth at bedtime.     . brimonidine (ALPHAGAN) 0.2 % ophthalmic solution Place 1 drop into both eyes at bedtime.     . budesonide-formoterol (SYMBICORT) 160-4.5 MCG/ACT inhaler INHALE TWO PUFFS INTO THE LUNGS TWO TIMES DAILY 30.6 g 6  . budesonide-formoterol (SYMBICORT) 160-4.5 MCG/ACT inhaler Inhale 2 puffs into the lungs 2 (two) times daily. 2 Inhaler 0  . clopidogrel (PLAVIX) 75 MG tablet TAKE ONE (1) TABLET BY MOUTH EVERY DAY 90 tablet 3  . dexlansoprazole (DEXILANT) 60 MG capsule Take 1 capsule (60 mg total) by mouth daily before supper. 30 capsule 11  . guaiFENesin (MUCINEX PO) Take 1,200 mg by mouth every 12 (twelve) hours as needed.    . latanoprost (XALATAN) 0.005 % ophthalmic solution Place 1 drop into both eyes at bedtime.     Marland Kitchen losartan (COZAAR) 100 MG tablet Take 1 tablet (100 mg total) by mouth at bedtime. 90 tablet 3  . metoprolol succinate (TOPROL-XL) 50 MG 24 hr tablet TAKE ONE (1) TABLET BY MOUTH EVERY DAY 90 tablet 3  . montelukast (SINGULAIR) 10 MG tablet Take 10 mg by mouth daily.     . Multiple Vitamins-Minerals (CENTRUM SILVER PO) Take 1 tablet by mouth daily.      . nitroGLYCERIN (NITROSTAT) 0.4 MG SL tablet Place 1 tablet (0.4 mg total) under the tongue every 5 (five) minutes as needed for chest pain. 25 tablet 3  . rosuvastatin (CRESTOR) 10 MG tablet TAKE ONE (1) TABLET BY MOUTH EVERY DAY 90 tablet 3  .  sucralfate (CARAFATE) 1 G tablet Take 1 tablet (1 g total) by mouth 4 (four) times daily -  with meals and at bedtime. (Patient taking differently: Take 1 g by mouth 4 (four) times daily -  with meals and at bedtime. As needed) 40 tablet 0  . tiotropium (SPIRIVA) 18 MCG inhalation capsule Place 1 capsule (18 mcg total) into inhaler and inhale daily.  30 capsule 6   No current facility-administered medications for this visit.     Socially he is widowed has 2 children. He quit smoking in 1997.  ROS General: Negative; No fevers, chills, or night sweats;  HEENT: Negative; No changes in vision or hearing, sinus congestion, difficulty swallowing Pulmonary: Positive for  COPD , pulmonary nodule; and recent pneumonia. Cardiovascular: Negative; No chest pain, presyncope, syncope, palpitations Status post femoropopliteal surgery 1993 GI: Positive for GERD; No nausea, vomiting, diarrhea, or abdominal pain GU: Negative; No dysuria, hematuria, or difficulty voiding Musculoskeletal: Negative; no myalgias, joint pain, or weakness Hematologic/Oncology: Negative; no easy bruising, bleeding Endocrine: Negative; no heat/cold intolerance; no diabetes Neuro: Negative; no changes in balance, headaches Skin: Negative; No rashes or skin lesions Psychiatric: Negative; No behavioral problems, depression Sleep: Negative; No snoring, daytime sleepiness, hypersomnolence, bruxism, restless legs, hypnogognic hallucinations, no cataplexy   PE BP (!) 164/72   Pulse 72   Ht 5' 8" (1.727 m)   Wt 155 lb 9.6 oz (70.6 kg)   BMI 23.66 kg/m    Repeat blood pressure by me was 162/74  Wt Readings from Last 3 Encounters:  07/05/18 155 lb 9.6 oz (70.6 kg)  05/09/18 160 lb 6.4 oz (72.8 kg)  04/07/18 163 lb 3.2 oz (74 kg)   General: Alert, oriented, no distress.  Skin: normal turgor, no rashes, warm and dry HEENT: Normocephalic, atraumatic. Pupils equal round and reactive to light; sclera anicteric; extraocular muscles  intact;  Nose without nasal septal hypertrophy Mouth/Parynx benign; Mallinpatti scale Neck: No JVD, soft left  carotid bruit; normal carotid upstroke Lungs: clear to ausculatation and percussion; no wheezing or rales Chest wall: without tenderness to palpitation Heart: PMI not displaced, RRR, s1 s2 normal, 1/6 systolic murmur, no diastolic murmur, no rubs, gallops, thrills, or heaves Abdomen: soft, nontender; no hepatosplenomehaly, BS+; abdominal aorta nontender and not dilated by palpation. Back: no CVA tenderness Pulses 2+ Musculoskeletal: full range of motion, normal strength, no joint deformities Extremities: no clubbing cyanosis or edema, Homan's sign negative  Neurologic: grossly nonfocal; Cranial nerves grossly wnl Psychologic: Normal mood and affect   ECG (independently read by me): Normal sinus rhythm at 72 bpm.  Right bundle branch block with repolarization changes.  No ectopy.  April 2018 ECG (independently read by me): Normal sinus rhythm at 65 bpm.  Right bundle-branch block with repolarization changes.  Borderline first-degree AV block with a PR interval 22 ms.  06/23/2016 ECG (independently read by me): normal sinus rhythm at 86 bpm.  Right bundle branch block with repolarization changes.  Small Q wave inferiorly.  November 2016 ECG (independently read by me):  Normal sinus rhythm with right bundle branch block and repolarization changes. First-degree AV block with PR interval of 210 ms.  March 2016 ECG (independently read by me): Normal sinus rhythm at 64 bpm.  Right bundle branch block with repolarization changes.  First-degree AV block with a PR interval of 228 ms.  September 2015 ECG (independently read by me): Sinus rhythm with first-degree AV block.  PR interval 214 ms.  Right bundle branch block with repolarization changes.  January 2015 ECG (independently read by me): Sinus rhythm 86 beats per minute. PR interval 200 ms.  Prior ECG of July 2014 :Sinus rhythm at 70  beats per minute. First degree AV block. 1 isolated unifocal PVC.  LABS:  BMP Latest Ref Rng & Units 10/19/2017 06/02/2016 06/01/2016  Glucose 65 - 99 mg/dL 142(H) 115(H) 108(H)  BUN 6 - 20 mg/dL  _0 Creatinine 0.61 - 1.24 mg/dL 0.82 0.85 0.89  Sodium 135 - 145 mmol/L 139 137 136  Potassium 3.5 - 5.1 mmol/L 4.2 4.0 3.5  Chloride 101 - 111 mmol/L 105 104 103  CO2 22 - 32 mmol/L 21(L) 25 25  Calcium 8.9 - 10.3 mg/dL 9.1 8.6(L) 8.4(L)    Hepatic Function Latest Ref Rng & Units 05/25/2016 04/07/2014 03/15/2014  Total Protein 6.5 - 8.1 g/dL 6.8 7.7 6.9  Albumin 3.5 - 5.0 g/dL 3.1(L) 4.1 3.7  AST 15 - 41 U/L 14(L) 20 15  ALT 17 - 63 U/L 13(L) 19 15  Alk Phosphatase 38 - 126 U/L 63 89 77  Total Bilirubin 0.3 - 1.2 mg/dL 0.9 0.5 0.5    CBC Latest Ref Rng & Units 10/19/2017 06/02/2016 06/01/2016  WBC 4.0 - 10.5 K/uL 6.4 14.6(H) 13.5(H)  Hemoglobin 13.0 - 17.0 g/dL 14.6 13.3 11.8(L)  Hematocrit 39.0 - 52.0 % 44.1 40.1 36.3(L)  Platelets 150 - 400 K/uL 282 531(H) 504(H)    Lab Results  Component Value Date   MCV 92.3 10/19/2017   MCV 91.8 06/02/2016   MCV 91.7 06/01/2016   Lab Results  Component Value Date   TSH 1.850 04/07/2014    BNP    Component Value Date/Time   PROBNP 190.6 (H) 04/07/2014 1600    Lipid Panel     Component Value Date/Time   CHOL 96 01/31/2013 0940   TRIG 78 01/31/2013 0940   HDL 37 (L) 01/31/2013 0940   CHOLHDL 2.6 01/31/2013 0940   VLDL 16 01/31/2013 0940   LDLCALC 43 01/31/2013 0940     RADIOLOGY: No results found.  IMPRESSION:  1. Essential hypertension   2. CAD in native artery   3. Hx of CABG   4. PVD (peripheral vascular disease) ([place])   5. Hyperlipidemia with target LDL less than 70   6. Left carotid artery stenosis     ASSESSMENT AND PLAN: Gregory Eaton is a 72 year old gentleman with both CAD as well as PVD. He is s/p CABG revascularization surgery in 1998.  His  nuclear perfusion study in 2015 suggested  patency of his grafts and  this was without scar or ischemia. His CAD appears stable and he is not having anginal symptoms on current therapy. His carotid ultrasound  on 04/07/2014 showed bilateral atherosclerotic plaque in the carotid bulb and internal carotid arteries with focal 60-69% stenosis in the left internal carotid.  Preocclusive narrowing was noted in the left external carotid.  His left vertebral artery was not visualized.  I reviewed his most recent Doppler findings with him.  He has occlusive disease in his right SFA but apparently continues to do well, status post femoropopliteal bypass surgery.  His 2019 lower extremity Doppler revealed noncompressible arteries with abnormal toe brachial index but on the most recent study the right ABI was slightly increased from 1 year previously.  His blood pressure today is elevated despite taking losartan 100 mg, amlodipine 5 mg, and Toprol-XL 50 mg daily.  I am further titrating amlodipine to 10 mg for more optimal blood pressure with target blood pressure less than 130/80.  He continues to be on rosuvastatin 10 mg for hyperlipidemia.  He will be undergoing repeat laboratory by his primary physician in January.  Target LDL is less than 70 and if he is not at goal I would recommend further titration of rosuvastatin in attempt to reach this goal.  With his CAD and PVD continues to be  on aspirin and Plavix.  He has GERD for which he takes Dexilant with benefit.  He has COPD and continues to take Spiriva in addition to Singulair and Symbicort.  Dr. Melynda Ripple is now retired who had previously followed him.  He is not having any anginal symptomatology with reference to his CAD.  He believes he is sleeping well.  We discussed the benefits of continued exercise.  I will see him in 6 months for reevaluation or sooner if problems arise.  Time spent: 25 minutes Troy Sine, MD, [facility]  07/10/2018 9:37 AM

## 2018-07-05 NOTE — Patient Instructions (Signed)
Medication Instructions:  INCREASE amlodipine to 10 mg daily  If you need a refill on your cardiac medications before your next appointment, please call your pharmacy.   Follow-Up: At [facility], you and your health needs are our priority.  As part of our continuing mission to provide you with exceptional heart care, we have created designated Provider Care Teams.  These Care Teams include your primary Cardiologist (physician) and Advanced Practice Providers (APPs -  Physician Assistants and Nurse Practitioners) who all work together to provide you with the care you need, when you need it. You will need a follow up appointment in 6 months.  Please call our office 2 months in advance to schedule this appointment.  You may see Dr. Claiborne Billings or one of the following Advanced Practice Providers on your designated Care Team: [place], [place] . Fabian Sharp, PA-C

## 2018-07-10 ENCOUNTER — Encounter: Payer: Self-pay | Admitting: Cardiovascular Disease

## 2018-07-21 DIAGNOSIS — B3784 Candidal otitis externa: Secondary | ICD-10-CM | POA: Diagnosis not present

## 2018-07-21 DIAGNOSIS — J029 Acute pharyngitis, unspecified: Secondary | ICD-10-CM | POA: Diagnosis not present

## 2018-08-01 DIAGNOSIS — I10 Essential (primary) hypertension: Secondary | ICD-10-CM | POA: Diagnosis not present

## 2018-08-01 DIAGNOSIS — E785 Hyperlipidemia, unspecified: Secondary | ICD-10-CM | POA: Diagnosis not present

## 2018-08-01 DIAGNOSIS — R7301 Impaired fasting glucose: Secondary | ICD-10-CM | POA: Diagnosis not present

## 2018-08-01 DIAGNOSIS — E782 Mixed hyperlipidemia: Secondary | ICD-10-CM | POA: Diagnosis not present

## 2018-08-02 NOTE — Progress Notes (Signed)
Discussed with RMR. Plans for EGD for follow up of Barrett's at 3 year interval from last (short segment). Patient is NIC'd.

## 2018-08-08 DIAGNOSIS — J449 Chronic obstructive pulmonary disease, unspecified: Secondary | ICD-10-CM | POA: Diagnosis not present

## 2018-08-08 DIAGNOSIS — E782 Mixed hyperlipidemia: Secondary | ICD-10-CM | POA: Diagnosis not present

## 2018-08-08 DIAGNOSIS — I10 Essential (primary) hypertension: Secondary | ICD-10-CM | POA: Diagnosis not present

## 2018-08-08 DIAGNOSIS — K219 Gastro-esophageal reflux disease without esophagitis: Secondary | ICD-10-CM | POA: Diagnosis not present

## 2018-08-08 DIAGNOSIS — I2510 Atherosclerotic heart disease of native coronary artery without angina pectoris: Secondary | ICD-10-CM | POA: Diagnosis not present

## 2018-10-25 ENCOUNTER — Telehealth: Payer: Self-pay | Admitting: Pharmacist

## 2018-10-25 DIAGNOSIS — J449 Chronic obstructive pulmonary disease, unspecified: Secondary | ICD-10-CM

## 2018-10-25 MED ORDER — MONTELUKAST SODIUM 10 MG PO TABS: 10.0000 mg | ORAL_TABLET | Freq: Every day | ORAL | 3 refills | Status: DC

## 2018-10-25 MED ORDER — UMECLIDINIUM-VILANTEROL 62.5-25 MCG/INH IN AEPB: 1.0000 | INHALATION_SPRAY | Freq: Every day | RESPIRATORY_TRACT | 11 refills | Status: DC

## 2018-10-25 NOTE — Progress Notes (Signed)
Gregory Eaton is a 73 y.o. male who was contacted in collaboration with Dr. Lenna Gilford to de-escalate.  Patient's current COPD medication regimen consists of: Symbicort, Spiriva, and PRN albuterol. Patient reports COPD is stable, but sometimes is aggravated by pollen. He reports using albuterol once weekly.  Allergies  Allergen Reactions  . Altace [Ramipril] Cough   Medication Sig  acetaminophen (TYLENOL) 500 MG tablet Take 500 mg by mouth daily as needed for headache.  albuterol (PROVENTIL) (2.5 MG/3ML) 0.083% nebulizer solution Take 3 mLs (2.5 mg total) by nebulization every 6 (six) hours as needed for wheezing or shortness of breath.  ALPRAZolam (XANAX) 0.5 MG tablet Take 1 tablet (0.5 mg total) by mouth at bedtime. Patient taking differently: Take 0.5 mg by mouth at bedtime as needed.   amLODipine (NORVASC) 10 MG tablet Take 1 tablet (10 mg total) by mouth daily.  aspirin 81 MG EC tablet Take 81 mg by mouth at bedtime.   brimonidine (ALPHAGAN) 0.2 % ophthalmic solution Place 1 drop into both eyes at bedtime.   budesonide-formoterol (SYMBICORT) 160-4.5 MCG/ACT inhaler INHALE TWO PUFFS INTO THE LUNGS TWO TIMES DAILY  budesonide-formoterol (SYMBICORT) 160-4.5 MCG/ACT inhaler Inhale 2 puffs into the lungs 2 (two) times daily.  clopidogrel (PLAVIX) 75 MG tablet TAKE ONE (1) TABLET BY MOUTH EVERY DAY  dexlansoprazole (DEXILANT) 60 MG capsule Take 1 capsule (60 mg total) by mouth daily before supper.  guaiFENesin (MUCINEX PO) Take 1,200 mg by mouth every 12 (twelve) hours as needed.  latanoprost (XALATAN) 0.005 % ophthalmic solution Place 1 drop into both eyes at bedtime.   losartan (COZAAR) 100 MG tablet Take 1 tablet (100 mg total) by mouth at bedtime.  metoprolol succinate (TOPROL-XL) 50 MG 24 hr tablet TAKE ONE (1) TABLET BY MOUTH EVERY DAY  montelukast (SINGULAIR) 10 MG tablet Take 10 mg by mouth daily.   Multiple Vitamins-Minerals (CENTRUM SILVER PO) Take 1 tablet by mouth daily.     nitroGLYCERIN (NITROSTAT) 0.4 MG SL tablet Place 1 tablet (0.4 mg total) under the tongue every 5 (five) minutes as needed for chest pain.  rosuvastatin (CRESTOR) 10 MG tablet TAKE ONE (1) TABLET BY MOUTH EVERY DAY  sucralfate (CARAFATE) 1 G tablet Take 1 tablet (1 g total) by mouth 4 (four) times daily -  with meals and at bedtime. Patient taking differently: Take 1 g by mouth 4 (four) times daily -  with meals and at bedtime. As needed  tiotropium (SPIRIVA) 18 MCG inhalation capsule Place 1 capsule (18 mcg total) into inhaler and inhale daily.   Past Medical History:  Diagnosis Date  . Adenomatous colon polyp 03/2009   Last colonoscopy by Dr. [name]   . Adenomatous polyp 2010  . Adenomatous polyp of colon 11/03/2010  . Arthritis   . Barrett's esophagus   . CAD (coronary artery disease)   . COPD (chronic obstructive pulmonary disease) (HCC)    Severe emphysema per CT  . Diverticulosis   . DM type 2 (diabetes mellitus, type 2) ([place]) 04/09/2014   A1c 6.5 (04/08/14)   . Esophageal carcinoma ([place]) 03/2009   T1N1M0  . GERD (gastroesophageal reflux disease)   . Glaucoma   . History of Doppler ultrasound 11/09/2011   03/2014- 50-69% L ICA stenosis;carotid doppler; L bulb/prox ICA 0-49% diameter reduction; L vertebral artery - occlusive ds; L ECA  demonstrates severe amount of fibrous plaque  . History of Doppler ultrasound 11/09/11   LEAs; R ABI - mod art. insuff.; L ABI normal at rest;  R SFA - occlusive ds; L SFA - occlusive ds; patent fem-pop graft  . History of echocardiogram 08/27/2009   EF >55%  . History of kidney stones   . History of nuclear stress test 11/24/2011   lexiscan; normal perfusion; low risk scan; non-diagnostic for ischemia  . Hyperlipidemia   . Hypertension   . Left carotid artery stenosis 04/08/2014  . Pulmonary nodule, right 04/08/2014   2.8 mm-incidental finding on CT  . PVD (peripheral vascular disease) ([place])   . Tachyarrhythmia 1999   Status post ablation [facility]  .  Tobacco abuse    Social History   Socioeconomic History  . Marital status: Married    Spouse name: Not on file  . Number of children: 2  . Years of education: Not on file  . Highest education level: Not on file  Occupational History  . 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 Financial resource strain: Not on file  . Food insecurity:    Worry: Not on file    Inability: Not on file  . Transportation needs:    Medical: Not on file    Non-medical: Not on file  Tobacco Use  . Smoking status: Former Smoker    Packs/day: 2.00    Years: 40.00    Pack years: 80.00    Types: Cigarettes    Last attempt to quit: 07/20/1995    Years since quitting: 23.2  . Smokeless tobacco: Never Used  Substance and Sexual Activity  . Alcohol use: No    Alcohol/week: 0.0 standard drinks  . Drug use: No  . Sexual activity: Yes    Partners: Female    Birth control/protection: Condom    Comment: friend  Lifestyle  . Physical activity:    Days per week: Not on file    Minutes per session: Not on file  . Stress: Not on file  Relationships  . Social connections:    Talks on phone: Not on file    Gets together: Not on file    Attends religious service: Not on file    Active member of club or organization: Not on file    Attends meetings of clubs or organizations: Not on file    Relationship status: Not on file  Other Topics Concern  . Not on file  Social History Narrative  . Not on file   Family History  Problem Relation Age of Onset  . GER disease Mother   . Coronary artery disease Brother   . Congenital heart disease Sister   . Colon cancer Neg Hx     O: Ht Readings from Last 2 Encounters:  07/05/18 5\' 8"  (1.727 m)  05/09/18 5\' 8"  (1.727 m)   Wt Readings from Last 2 Encounters:  07/05/18 155 lb 9.6 oz (70.6 kg)  05/09/18 160 lb 6.4 oz (72.8 kg)   There is no height or weight on file to calculate BMI.  CAT ASSESSMENT  Rank each of the  following items on a scale of 0 to 5 (with 5 being most severe) Write a # 0-5 in each box  I never cough (0) > I cough all the time (5) 1  I have no phlegm (mucus) in my chest (0) > My chest is completely full of phlegm (mucus) (5) 1  My chest does not feel tight at all (0) > My chest feels very tight (5) 2  When I walk up  a hill or one flight of stairs I am not breathless (0) > When I walk up a hill or one flight of stairs I am very breathless (5) 3  I am not limited doing any activities at home (0) > I am very limited doing activities at home (5) 2  I am confident leaving my home despite my lung function (0) > I am not at all confident leaving my home because of my lung condition (5)  1  I sleep soundly (0) > I don't sleep soundly because of my lung condition (5) 0  I have lots of energy (0) > I have no energy at all (5) 0   Total CAT Score: 10 Absolute eosinophil count was 0 in 2017  A/P: . Based on a review of the patient's current COPD medications and symptoms, switch patient from Symbicort/Spiriva to Anoro per Dr. Lenna Gilford. Confirmed copay with [place], inhaler cost for patient will decrease from $90/month to $45/month. . Educated patient on proper use of inhalers, importance of medication adherence and identified other risk factors for COPD exacerbation.  . Patient requested refill of Singulair, sent to [facility]. . Counseled patient to report any changes in COPD medications, breathing, or shortness of breath.  . Will follow up with patient to assess response to therapy change (if symptoms arise or worsen, can switch back to Symbicort/Spiriva). . Will notify PCP.  Follow up phone call within 1 month and 3 months. Patient advised to contact me if any changes in condition or questions regarding medications arise. The patient verbalized understanding of information provided by repeating back concepts discussed.

## 2018-10-30 ENCOUNTER — Encounter: Payer: Self-pay | Admitting: Podiatry

## 2018-10-30 ENCOUNTER — Ambulatory Visit: Payer: Medicare Other | Admitting: Podiatry

## 2018-10-30 ENCOUNTER — Other Ambulatory Visit: Payer: Self-pay

## 2018-10-30 ENCOUNTER — Ambulatory Visit (INDEPENDENT_AMBULATORY_CARE_PROVIDER_SITE_OTHER): Payer: Medicare Other

## 2018-10-30 VITALS — Temp 97.9°F

## 2018-10-30 DIAGNOSIS — M7742 Metatarsalgia, left foot: Secondary | ICD-10-CM

## 2018-10-30 DIAGNOSIS — I739 Peripheral vascular disease, unspecified: Secondary | ICD-10-CM | POA: Diagnosis not present

## 2018-10-30 DIAGNOSIS — M7751 Other enthesopathy of right foot: Secondary | ICD-10-CM | POA: Diagnosis not present

## 2018-10-30 DIAGNOSIS — M7741 Metatarsalgia, right foot: Secondary | ICD-10-CM

## 2018-10-30 DIAGNOSIS — M7752 Other enthesopathy of left foot: Secondary | ICD-10-CM

## 2018-10-30 DIAGNOSIS — M779 Enthesopathy, unspecified: Secondary | ICD-10-CM

## 2018-10-30 MED ORDER — DICLOFENAC SODIUM 1 % TD GEL: 2.0000 g | Freq: Four times a day (QID) | TRANSDERMAL | 2 refills | Status: DC

## 2018-11-01 ENCOUNTER — Telehealth: Payer: Self-pay | Admitting: *Deleted

## 2018-11-01 DIAGNOSIS — I739 Peripheral vascular disease, unspecified: Secondary | ICD-10-CM

## 2018-11-01 DIAGNOSIS — M79672 Pain in left foot: Secondary | ICD-10-CM

## 2018-11-01 DIAGNOSIS — M79671 Pain in right foot: Secondary | ICD-10-CM

## 2018-11-01 NOTE — Progress Notes (Signed)
Subjective:   Patient ID: Gregory Eaton, male   DOB: 73 y.o.   MRN: [id number]   HPI 73 year old male presents the office today for concerns of pain to the ball of his toes mostly submetatarsal 1 that he points to.  He states this is been worse over the last couple of weeks. He states that it hurts more when he puts weight on the ball the foot.Marland Kitchen  He thinks it started when he was on his feet more.  He was painting and on his feet a lot when the pain started.  Is also concerned about his circulation to him he has poor circulation as well.  He has noticed some redness of the toes.  He states the symptoms have improved some.  He denies any recent injury or trauma.  No swelling.   Review of Systems  All other systems reviewed and are negative.  Past Medical History:  Diagnosis Date  . Adenomatous colon polyp 03/2009   Last colonoscopy by Dr. [name]   . Adenomatous polyp 2010  . Adenomatous polyp of colon 11/03/2010  . Arthritis   . Barrett's esophagus   . CAD (coronary artery disease)   . COPD (chronic obstructive pulmonary disease) (HCC)    Severe emphysema per CT  . Diverticulosis   . DM type 2 (diabetes mellitus, type 2) ([place]) 04/09/2014   A1c 6.5 (04/08/14)   . Esophageal carcinoma ([place]) 03/2009   T1N1M0  . GERD (gastroesophageal reflux disease)   . Glaucoma   . History of Doppler ultrasound 11/09/2011   03/2014- 50-69% L ICA stenosis;carotid doppler; L bulb/prox ICA 0-49% diameter reduction; L vertebral artery - occlusive ds; L ECA  demonstrates severe amount of fibrous plaque  . History of Doppler ultrasound 11/09/11   LEAs; R ABI - mod art. insuff.; L ABI normal at rest; R SFA - occlusive ds; L SFA - occlusive ds; patent fem-pop graft  . History of echocardiogram 08/27/2009   EF >55%  . History of kidney stones   . History of nuclear stress test 11/24/2011   lexiscan; normal perfusion; low risk scan; non-diagnostic for ischemia  . Hyperlipidemia   . Hypertension   . Left carotid artery  stenosis 04/08/2014  . Pulmonary nodule, right 04/08/2014   2.8 mm-incidental finding on CT  . PVD (peripheral vascular disease) ([place])   . Tachyarrhythmia 1999   Status post ablation at [facility]  . Tobacco abuse     Past Surgical History:  Procedure Laterality Date  . BIOPSY  10/24/2017   Procedure: BIOPSY;  Surgeon: [name], MD;  [facility];  Service: Endoscopy;;  esophagus  . COLONOSCOPY  03/17/2009   Dr.Rourk- normal rectum, sigmoid diverticula, some pale sigmoid mucosa with diffuse petechiae. pedunculated polyp at the splenic flexure, remainder of colonic mucosa appeared normal. bx= adenomatous polyp  . COLONOSCOPY  04/19/2003   Dr.Rehman- few diverticu;a at the sigmoid colon, 3 small polyps, one at the transverse colon and 2 at the sigmoid, small external hemorrhoids. bx report not available.   . COLONOSCOPY WITH PROPOFOL N/A 10/24/2017   Dr. [name]: Diverticulosis, 11 mm polyp at the ileocecal valve, tubular adenoma.  Repeat colonoscopy in 3 years  . CORONARY ARTERY BYPASS GRAFT  1998   [name]  . ESOPHAGECTOMY  2010   [facility] Dr. [name]  . ESOPHAGOGASTRODUODENOSCOPY  11/25/2010   Dr.Rourk- s/p esophagectomy with gastric pull-up, esophageal erosions straddling the surgical anastomosis, salmon colored epithelium coming up a good centimeter to  a centimeter and a half above the suture line, islands of salmon colored epithelium in the most poximal residual esophagus, remainder of gastric mucosa appeared normal. bx= swamous &gastric glandular mucosa w/chronic active inflammation  . ESOPHAGOGASTRODUODENOSCOPY  03/17/2009   Dr.Rourk- 4cm segment of salmon-colored epithlium distal esophagus suspicious for barretts esophagus. area of suspicious nodularity w/in this segment bx seperately. small to moderate size hiatal hernia, o/w normal stomach D1 and D2 bx=adenocarcinoma  . ESOPHAGOGASTRODUODENOSCOPY (EGD) WITH PROPOFOL N/A 10/24/2017   Dr. [name]: Remnant of esophagus with  anastomosis with stomach at 24 cm from the incisors, 2 cm above the anastomosis he was found to have Barrett's but no dysplasia.  Advised for repeat EGD in 3 years  . FEMORAL-POPLITEAL BYPASS GRAFT  1993   occlusive ds in R SFA  . GASTRIC PULL THROUGH  2010   With esophagectomy  . LIPOMA EXCISION  2010  . POLYPECTOMY  10/24/2017   Procedure: POLYPECTOMY;  Surgeon: [name], MD;  [facility]: [facility];  Service: Endoscopy;;  colon      Current Outpatient Medications:  .  Budesonide-Formoterol Fumarate (SYMBICORT IN), Inhale into the lungs., Disp: , Rfl:  .  acetaminophen (TYLENOL) 500 MG tablet, Take 500 mg by mouth daily as needed for headache., Disp: , Rfl:  .  albuterol (PROVENTIL) (2.5 MG/3ML) 0.083% nebulizer solution, Take 3 mLs (2.5 mg total) by nebulization every 6 (six) hours as needed for wheezing or shortness of breath., Disp: 75 mL, Rfl: 12 .  ALPRAZolam (XANAX) 0.5 MG tablet, Take 1 tablet (0.5 mg total) by mouth at bedtime. (Patient taking differently: Take 0.5 mg by mouth at bedtime as needed. ), Disp: 30 tablet, Rfl: 0 .  amLODipine (NORVASC) 10 MG tablet, Take 1 tablet (10 mg total) by mouth daily., Disp: 90 tablet, Rfl: 3 .  amoxicillin-clavulanate (AUGMENTIN) 875-125 MG tablet, , Disp: , Rfl:  .  aspirin 81 MG EC tablet, Take 81 mg by mouth at bedtime. , Disp: , Rfl:  .  azithromycin (ZITHROMAX) 250 MG tablet, , Disp: , Rfl:  .  brimonidine (ALPHAGAN) 0.2 % ophthalmic solution, Place 1 drop into both eyes at bedtime. , Disp: , Rfl:  .  clopidogrel (PLAVIX) 75 MG tablet, TAKE ONE (1) TABLET BY MOUTH EVERY DAY, Disp: 90 tablet, Rfl: 3 .  dexlansoprazole (DEXILANT) 60 MG capsule, Take 1 capsule (60 mg total) by mouth daily before supper., Disp: 30 capsule, Rfl: 11 .  diclofenac sodium (VOLTAREN) 1 % GEL, Apply 2 g topically 4 (four) times daily. Rub into affected area of foot 2 to 4 times daily, Disp: 100 g, Rfl: 2 .  guaiFENesin (MUCINEX PO), Take 1,200 mg by mouth  every 12 (twelve) hours as needed., Disp: , Rfl:  .  latanoprost (XALATAN) 0.005 % ophthalmic solution, Place 1 drop into both eyes at bedtime. , Disp: , Rfl:  .  losartan (COZAAR) 100 MG tablet, Take 1 tablet (100 mg total) by mouth at bedtime., Disp: 90 tablet, Rfl: 3 .  metoprolol succinate (TOPROL-XL) 50 MG 24 hr tablet, TAKE ONE (1) TABLET BY MOUTH EVERY DAY, Disp: 90 tablet, Rfl: 3 .  montelukast (SINGULAIR) 10 MG tablet, Take 1 tablet (10 mg total) by mouth daily., Disp: 90 tablet, Rfl: 3 .  Multiple Vitamins-Minerals (CENTRUM SILVER PO), Take 1 tablet by mouth daily.  , Disp: , Rfl:  .  neomycin-polymyxin-hydrocortisone (CORTISPORIN) 3.5-10000-1 OTIC suspension, , Disp: , Rfl:  .  nitroGLYCERIN (NITROSTAT) 0.4 MG SL tablet, Place  1 tablet (0.4 mg total) under the tongue every 5 (five) minutes as needed for chest pain., Disp: 25 tablet, Rfl: 3 .  rosuvastatin (CRESTOR) 10 MG tablet, TAKE ONE (1) TABLET BY MOUTH EVERY DAY, Disp: 90 tablet, Rfl: 3 .  sucralfate (CARAFATE) 1 G tablet, Take 1 tablet (1 g total) by mouth 4 (four) times daily -  with meals and at bedtime. (Patient taking differently: Take 1 g by mouth 4 (four) times daily -  with meals and at bedtime. As needed), Disp: 40 tablet, Rfl: 0 .  umeclidinium-vilanterol (ANORO ELLIPTA) 62.5-25 MCG/INH AEPB, Inhale 1 puff into the lungs daily., Disp: 60 each, Rfl: 11  Allergies  Allergen Reactions  . Altace [Ramipril] Cough          Objective:  Physical Exam  General: AAO x3, NAD  Dermatological: Skin is warm, dry and supple bilateral. There are no open sores, no preulcerative lesions, no rash or signs of infection present.  Vascular: Dorsalis Pedis artery and Posterior Tibial artery pedal pulses are decreased bilateral.  No varicosities and no lower extremity edema present bilateral. There is no pain with calf compression, swelling, warmth, erythema.   Neruologic: Grossly intact via light touch bilateral.   Musculoskeletal:  There is prominence of metatarsal heads plantarly with atrophy of the fat pad.  The majority tenderness that he is experiencing submetatarsal 1 on the course of the sesamoids bilaterally.  There is prominence of the sesamoids.  There is no edema, erythema unable to identified today.  There is no area pinpoint bony tenderness.  There is no edema, erythema.  Muscular strength 5/5 in all groups tested bilateral.  Gait: Unassisted, Nonantalgic.       Assessment:   Metatarsalgia, PAD     Plan:  -Treatment options discussed including all alternatives, risks, and complications -Etiology of symptoms were discussed -X-rays were obtained and reviewed with the patient. There is no evidence of acute fracture or stress fracture identified today. -I do think a lot of his symptoms of because he has been on his feet more.  Given the trauma metatarsal heads with atrophy of the fat pad he is putting more pressure of metatarsal heads.  We discussed shoe modifications and possibly inserts inside of his shoes.  We also dispensed gel metatarsal pads to help offload the area.  Voltaren gel to use only as needed. -Given symptoms and known PAD will order his yearly arterial duplex.   Return in about 3 weeks (around 11/20/2018). Likely will be a telephone/video visit.   Trula Slade DPM  Cc: Shelva Majestic, MD

## 2018-11-01 NOTE — Telephone Encounter (Signed)
Orders faxed to [facility]. 

## 2018-11-01 NOTE — Telephone Encounter (Signed)
-----   Message from Trula Slade, DPM sent at 11/01/2018  7:55 AM EDT ----- Can you please order arterial duplex for him? He has known PAD and he needs a yearly check. He has bene having increased burning to his feet as well. Can this be done at [facility]? Thanks.

## 2018-11-06 ENCOUNTER — Other Ambulatory Visit: Payer: Self-pay

## 2018-11-06 ENCOUNTER — Ambulatory Visit (HOSPITAL_COMMUNITY)
Admission: RE | Admit: 2018-11-06 | Discharge: 2018-11-06 | Disposition: A | Discharge status: Home / Self Care | Payer: Medicare Other | Source: Ambulatory Visit | Attending: Cardiology | Admitting: Cardiology

## 2018-11-06 DIAGNOSIS — I739 Peripheral vascular disease, unspecified: Secondary | ICD-10-CM | POA: Insufficient documentation

## 2018-11-06 DIAGNOSIS — M79671 Pain in right foot: Secondary | ICD-10-CM | POA: Insufficient documentation

## 2018-11-06 DIAGNOSIS — M79672 Pain in left foot: Secondary | ICD-10-CM | POA: Diagnosis not present

## 2018-11-08 ENCOUNTER — Telehealth: Payer: Self-pay | Admitting: *Deleted

## 2018-11-08 ENCOUNTER — Telehealth: Payer: Self-pay | Admitting: Pulmonary Disease

## 2018-11-08 DIAGNOSIS — M25552 Pain in left hip: Secondary | ICD-10-CM | POA: Diagnosis not present

## 2018-11-08 NOTE — Telephone Encounter (Signed)
   [facility] Pre-operative Risk Assessment    Request for surgical clearance:  1. What type of surgery is being performed? Left total hip replacement   2. When is this surgery scheduled? TBD   3. What type of clearance is required (medical clearance vs. Pharmacy clearance to hold med vs. Both)? both  4. Are there any medications that need to be held prior to surgery and how long?plavix and aspirin-they are asking for direction   5. Practice name and name of physician performing surgery? Dr Fredonia Highland   6. What is your office phone number [phone]    7.   What is your office fax number [phone] [email or web address]  8.   Anesthesia type (None, local, MAC, general) ? Not listed   Gregory Eaton 11/08/2018, 2:21 PM  _________________________________________________________________   (provider comments below)

## 2018-11-08 NOTE — Telephone Encounter (Signed)
Fax received from [facility] orthopedic specialist, and spoke with Claiborne Billings [phone] ext [phone]).  Claiborne Billings stated hip surgery was not urgent and will not be done until Covid 19 restrictions are over. Patient was last seen by Dr Lenna Gilford, 05/09/2018, for pulmonary emphysema follow up, and was suppose to follow up with Dr Loanne Drilling, but due to Covid 19, follow up has not been scheduled. Patient last PFT was 04/03/15, last spirometry was 03/23/2016. Surgical release form with [facility] Dr Lenna Gilford  folder on A pod.  Per Geraldo Pitter, NP, App of the day- Patient will need surgical clearance OV with PFT, once restrictions are cleared in office.

## 2018-11-08 NOTE — Telephone Encounter (Signed)
Okay to hold for 5 days prior to planned procedure

## 2018-11-08 NOTE — Telephone Encounter (Signed)
Dr. Claiborne Billings, Please comment on holding ASA and plavix for hip replacement. Thanks Angie

## 2018-11-09 NOTE — Telephone Encounter (Signed)
   Primary Cardiologist: Shelva Majestic, MD  Chart reviewed as part of pre-operative protocol coverage. Patient was contacted 11/09/2018 in reference to pre-operative risk assessment for pending surgery as outlined below.  ZAVEN KLEMENS was last seen on 07/05/18 by Dr. Claiborne Billings.  Since that day, DAMONTRE MILLEA has done well. His mobility has been limited by severe hip pain.   Per Dr. Claiborne Billings,  [place] to hold ASA and plavix for 5 days prior to planned procedure.  Therefore, based on ACC/AHA guidelines, the patient would be at acceptable risk for the planned procedure without further cardiovascular testing.   I will route this recommendation to the requesting party via Epic fax function and remove from pre-op pool.  These recommendations are good for two months and based on the patient's current clinical picture.  Please call with questions.   Tami Lin Deondrea Markos, PA 11/09/2018, 8:41 AM

## 2018-11-09 NOTE — Telephone Encounter (Signed)
Follow Up:     Pt said he was retuning a call.

## 2018-11-14 ENCOUNTER — Telehealth: Payer: Self-pay | Admitting: Pharmacist

## 2018-11-14 NOTE — Progress Notes (Signed)
Gregory Eaton is a 73 y.o. male who was contacted for follow up after inhaled corticosteroid de-escalation per Dr. Lenna Gilford. Patient identity was verified using date of birth and address. Patient reports improvement in symptoms and he has not used albuterol for rescue this month.  Allergies  Allergen Reactions  . Altace [Ramipril] Cough     Medication Sig      acetaminophen (TYLENOL) 500 MG tablet Take 500 mg by mouth daily as needed for headache.      albuterol (PROVENTIL) (2.5 MG/3ML) 0.083% nebulizer solution Take 3 mLs (2.5 mg total) by nebulization every 6 (six) hours as needed for wheezing or shortness of breath.      ALPRAZolam (XANAX) 0.5 MG tablet Take 1 tablet (0.5 mg total) by mouth at bedtime. Patient taking differently: Take 0.5 mg by mouth at bedtime as needed.       amLODipine (NORVASC) 10 MG tablet Take 1 tablet (10 mg total) by mouth daily.      amoxicillin-clavulanate (AUGMENTIN) 875-125 MG tablet       aspirin 81 MG EC tablet Take 81 mg by mouth at bedtime.       azithromycin (ZITHROMAX) 250 MG tablet       brimonidine (ALPHAGAN) 0.2 % ophthalmic solution Place 1 drop into both eyes at bedtime.       Budesonide-Formoterol Fumarate (SYMBICORT IN) Inhale into the lungs.      clopidogrel (PLAVIX) 75 MG tablet TAKE ONE (1) TABLET BY MOUTH EVERY DAY      dexlansoprazole (DEXILANT) 60 MG capsule Take 1 capsule (60 mg total) by mouth daily before supper.      diclofenac sodium (VOLTAREN) 1 % GEL Apply 2 g topically 4 (four) times daily. Rub into affected area of foot 2 to 4 times daily      guaiFENesin (MUCINEX PO) Take 1,200 mg by mouth every 12 (twelve) hours as needed.      latanoprost (XALATAN) 0.005 % ophthalmic solution Place 1 drop into both eyes at bedtime.       losartan (COZAAR) 100 MG tablet Take 1 tablet (100 mg total) by mouth at bedtime.      metoprolol succinate (TOPROL-XL) 50 MG 24 hr tablet TAKE ONE (1) TABLET BY MOUTH EVERY DAY      montelukast (SINGULAIR) 10 MG  tablet Take 1 tablet (10 mg total) by mouth daily.      Multiple Vitamins-Minerals (CENTRUM SILVER PO) Take 1 tablet by mouth daily.        neomycin-polymyxin-hydrocortisone (CORTISPORIN) 3.5-10000-1 OTIC suspension       nitroGLYCERIN (NITROSTAT) 0.4 MG SL tablet Place 1 tablet (0.4 mg total) under the tongue every 5 (five) minutes as needed for chest pain.      rosuvastatin (CRESTOR) 10 MG tablet TAKE ONE (1) TABLET BY MOUTH EVERY DAY      sucralfate (CARAFATE) 1 G tablet Take 1 tablet (1 g total) by mouth 4 (four) times daily -  with meals and at bedtime. Patient taking differently: Take 1 g by mouth 4 (four) times daily -  with meals and at bedtime. As needed      umeclidinium-vilanterol (ANORO ELLIPTA) 62.5-25 MCG/INH AEPB Inhale 1 puff into the lungs daily.       Past Medical History:  Diagnosis Date  . Adenomatous colon polyp 03/2009   Last colonoscopy by Dr. [name]   . Adenomatous polyp 2010  . Adenomatous polyp of colon 11/03/2010  . Arthritis   . Barrett's esophagus   .  CAD (coronary artery disease)   . COPD (chronic obstructive pulmonary disease) (HCC)    Severe emphysema per CT  . Diverticulosis   . DM type 2 (diabetes mellitus, type 2) ([place]) 04/09/2014   A1c 6.5 (04/08/14)   . Esophageal carcinoma ([place]) 03/2009   T1N1M0  . GERD (gastroesophageal reflux disease)   . Glaucoma   . History of Doppler ultrasound 11/09/2011   03/2014- 50-69% L ICA stenosis;carotid doppler; L bulb/prox ICA 0-49% diameter reduction; L vertebral artery - occlusive ds; L ECA  demonstrates severe amount of fibrous plaque  . History of Doppler ultrasound 11/09/11   LEAs; R ABI - mod art. insuff.; L ABI normal at rest; R SFA - occlusive ds; L SFA - occlusive ds; patent fem-pop graft  . History of echocardiogram 08/27/2009   EF >55%  . History of kidney stones   . History of nuclear stress test 11/24/2011   lexiscan; normal perfusion; low risk scan; non-diagnostic for ischemia  . Hyperlipidemia   .  Hypertension   . Left carotid artery stenosis 04/08/2014  . Pulmonary nodule, right 04/08/2014   2.8 mm-incidental finding on CT  . PVD (peripheral vascular disease) ([place])   . Tachyarrhythmia 1999   Status post ablation at [facility]  . Tobacco abuse    Social History   Socioeconomic History  . Marital status: Married    Spouse name: Not on file  . Number of children: 2  . Years of education: Not on file  . Highest education level: Not on file  Occupational History  . 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 Financial resource strain: Not on file  . Food insecurity:    Worry: Not on file    Inability: Not on file  . Transportation needs:    Medical: Not on file    Non-medical: Not on file  Tobacco Use  . Smoking status: Former Smoker    Packs/day: 2.00    Years: 40.00    Pack years: 80.00    Types: Cigarettes    Last attempt to quit: 07/20/1995    Years since quitting: 23.3  . Smokeless tobacco: Never Used  Substance and Sexual Activity  . Alcohol use: No    Alcohol/week: 0.0 standard drinks  . Drug use: No  . Sexual activity: Yes    Partners: Female    Birth control/protection: Condom    Comment: friend  Lifestyle  . Physical activity:    Days per week: Not on file    Minutes per session: Not on file  . Stress: Not on file  Relationships  . Social connections:    Talks on phone: Not on file    Gets together: Not on file    Attends religious service: Not on file    Active member of club or organization: Not on file    Attends meetings of clubs or organizations: Not on file    Relationship status: Not on file  Other Topics Concern  . Not on file  Social History Narrative  . Not on file   Family History  Problem Relation Age of Onset  . GER disease Mother   . Coronary artery disease Brother   . Congenital heart disease Sister   . Colon cancer Neg Hx    O: Ht Readings from Last 2 Encounters:  07/05/18 5\' 8"  (1.727  m)  05/09/18 5\' 8"  (1.727 m)   Wt Readings from Last  2 Encounters:  07/05/18 155 lb 9.6 oz (70.6 kg)  05/09/18 160 lb 6.4 oz (72.8 kg)   There is no height or weight on file to calculate BMI.  CAT ASSESSMENT  Rank each of the following items on a scale of 0 to 5 (with 5 being most severe) Write a # 0-5 in each box  I never cough (0) > I cough all the time (5) 1  I have no phlegm (mucus) in my chest (0) > My chest is completely full of phlegm (mucus) (5) 0  My chest does not feel tight at all (0) > My chest feels very tight (5) 0  When I walk up a hill or one flight of stairs I am not breathless (0) > When I walk up a hill or one flight of stairs I am very breathless (5) 2  I am not limited doing any activities at home (0) > I am very limited doing activities at home (5) 0  I am confident leaving my home despite my lung function (0) > I am not at all confident leaving my home because of my lung condition (5)  0  I sleep soundly (0) > I don't sleep soundly because of my lung condition (5) 0  I have lots of energy (0) > I have no energy at all (5) 0   Total CAT Score: 3  A/P: . Patient reports symptoms have improved (3 weeks ago, CAT score 10) following switch from Spiriva/Symbicort to Anoro. . Patient correctly reports Anoro utilization/instructions. . No changes recommended at this time. . Counseled patient to report any changes in COPD, breathing, or shortness of breath.  . Will send note to PCP and pulmonology team. . Follow up with patient once more within 2 months.   The patient verbalized understanding of information provided by repeating back concepts discussed.

## 2018-11-15 ENCOUNTER — Ambulatory Visit: Payer: Medicare Other | Admitting: Pulmonary Disease

## 2018-11-22 ENCOUNTER — Other Ambulatory Visit: Payer: Self-pay

## 2018-11-22 DIAGNOSIS — Z0000 Encounter for general adult medical examination without abnormal findings: Secondary | ICD-10-CM | POA: Diagnosis not present

## 2018-11-22 NOTE — Patient Outreach (Signed)
[facility]) Care Management  11/22/2018  Gregory Eaton December 30, 1945 [id number]   Medication Adherence call to [name] [facility] Ident[facility] Verify spoke with patient he is due on Rosuvastatin 10 mg and Losartan 100 mg patient is still taking both medication once daily and has already pick up form the pharmacy. [name] is showing past due under [place].   [place] Management Direct Dial [phone]  Fax [phone] [email or web address]

## 2018-11-23 ENCOUNTER — Other Ambulatory Visit (HOSPITAL_COMMUNITY): Payer: Self-pay | Admitting: Physician Assistant

## 2018-11-23 ENCOUNTER — Telehealth: Payer: Self-pay | Admitting: *Deleted

## 2018-11-23 DIAGNOSIS — I739 Peripheral vascular disease, unspecified: Secondary | ICD-10-CM

## 2018-11-23 DIAGNOSIS — M79672 Pain in left foot: Secondary | ICD-10-CM

## 2018-11-23 DIAGNOSIS — M79671 Pain in right foot: Secondary | ICD-10-CM

## 2018-11-23 DIAGNOSIS — I6523 Occlusion and stenosis of bilateral carotid arteries: Secondary | ICD-10-CM

## 2018-11-23 NOTE — Telephone Encounter (Signed)
Faxed referral, clinicals and demographics to [facility]. 

## 2018-11-23 NOTE — Telephone Encounter (Signed)
-----   Message from Trula Slade, DPM sent at 11/22/2018  3:35 PM EDT ----- Tivis Ringer- I had talked to him the other week about the results and also sent to his cardiologist. Can you please put in a referral for him to see Dr. Gwenlyn Found for his circulation? Please let the patient know I just wanted to get it checked since there was some increase in stenosis in an area from his exam last year? Thanks.

## 2018-11-23 NOTE — Telephone Encounter (Signed)
I informed pt and informed of Dr. Leigh Aurora referral, pt asked why did Dr. Jacqualyn Posey want pt to change heart doctors, since he had been seeing his cardiologist since 1989. I told pt I would ask Dr. Jacqualyn Posey and call again.

## 2018-11-24 NOTE — Telephone Encounter (Signed)
He can go back to his regular cardiologist. Given there is some worsening of the circulation, I think he should see someone that does vascular work. He can follow up with his regular cardiologist first and if they feel the need to send him to someone else that is OK. Last I spoke to his foot pain had improved and he didn't really have vascular symptoms.

## 2018-11-27 NOTE — Telephone Encounter (Signed)
I informed pt Dr. Jacqualyn Posey had not known if Dr. Claiborne Billings performed the procedures pt would need to increase his blood flow to his feet, but Dr. Gwenlyn Found is in the same cardiovascular group as Dr. Claiborne Billings so could continue his care with him. Pt states understanding and thanked me for my call.

## 2018-12-06 ENCOUNTER — Telehealth: Payer: Self-pay | Admitting: Internal Medicine

## 2018-12-06 NOTE — Telephone Encounter (Signed)
Called and spoke with patient regarding setting up mychart Pt refuses to have this set up for him, he doesn't have intranet nor smart phone Nothing further needed.

## 2018-12-19 ENCOUNTER — Ambulatory Visit (INDEPENDENT_AMBULATORY_CARE_PROVIDER_SITE_OTHER): Payer: Medicare Other

## 2018-12-19 ENCOUNTER — Other Ambulatory Visit: Payer: Self-pay

## 2018-12-19 ENCOUNTER — Encounter: Payer: Self-pay | Admitting: Internal Medicine

## 2018-12-19 ENCOUNTER — Ambulatory Visit (INDEPENDENT_AMBULATORY_CARE_PROVIDER_SITE_OTHER): Payer: Medicare Other | Admitting: Internal Medicine

## 2018-12-19 VITALS — BP 132/70 | HR 77 | Temp 98.2°F | Ht 68.0 in | Wt 157.8 lb

## 2018-12-19 DIAGNOSIS — J449 Chronic obstructive pulmonary disease, unspecified: Secondary | ICD-10-CM | POA: Diagnosis not present

## 2018-12-19 DIAGNOSIS — R05 Cough: Secondary | ICD-10-CM | POA: Diagnosis not present

## 2018-12-19 DIAGNOSIS — H401131 Primary open-angle glaucoma, bilateral, mild stage: Secondary | ICD-10-CM | POA: Diagnosis not present

## 2018-12-19 MED ORDER — GLYCOPYRROLATE-FORMOTEROL 9-4.8 MCG/ACT IN AERO: 2.0000 | INHALATION_SPRAY | Freq: Two times a day (BID) | RESPIRATORY_TRACT | 0 refills | Status: DC

## 2018-12-19 MED ORDER — GLYCOPYRROLATE-FORMOTEROL 9-4.8 MCG/ACT IN AERO: 2.0000 | INHALATION_SPRAY | Freq: Two times a day (BID) | RESPIRATORY_TRACT | 11 refills | Status: DC

## 2018-12-19 NOTE — Progress Notes (Signed)
Gregory Eaton, male    DOB: December 31, 1945, 73 y.o.   MRN: [id number]   Brief patient profile:  73 yowm quit smoking 1997 with GOLD II criteria 03/2015 referred to pulmonary clinic 12/19/2018 by Dr   [facility] for L THR. Clearance     History of Present Illness  12/19/2018  Pulmonary consultation office eval/Jaman Aro  Dyspnea:  Limited by hip p several blocks Cough: min in am/ mucoid / "just to clear throat" ever since es ca surgery 2010 s RT/ chemo Sleep: under 8 in blocks due to regurg SABA use: not using  ?   throat clearing some worse since started anoro   No obvious day to day or daytime variability or assoc purulent sputum or mucus plugs or hemoptysis or cp or chest tightness, subjective wheeze or overt sinus or hb symptoms.   Sleeping as above  without nocturnal   exacerbation  of respiratory  c/o's or need for noct saba. Also denies any obvious fluctuation of symptoms with weather or environmental changes or other aggravating or alleviating factors except as outlined above   No unusual exposure hx or h/o childhood pna/ asthma or knowledge of premature birth.  Current Allergies, Complete Past Medical History, Past Surgical History, Family History, and Social History were reviewed in [employer] [employer] record.  ROS  The following are not active complaints unless bolded Hoarseness, sore throat, dysphagia, dental problems, itching, sneezing,  nasal congestion or discharge of excess mucus or purulent secretions, ear ache,   fever, chills, sweats, unintended wt loss or wt gain, classically pleuritic or exertional cp,  orthopnea pnd or arm/hand swelling  or leg swelling, presyncope, palpitations, abdominal pain, anorexia, nausea, vomiting, diarrhea  or change in bowel habits or change in bladder habits, change in stools or change in urine, dysuria, hematuria,  rash, arthralgias, visual complaints, headache, numbness, weakness or ataxia or problems with walking or coordination,   change in mood or  memory.           Past Medical History:  Diagnosis Date   Adenomatous colon polyp 03/2009   Last colonoscopy by Dr. [name]    Adenomatous polyp 2010   Adenomatous polyp of colon 11/03/2010   Arthritis    Barrett's esophagus    CAD (coronary artery disease)    COPD (chronic obstructive pulmonary disease) (HCC)    Severe emphysema per CT   Diverticulosis    DM type 2 (diabetes mellitus, type 2) ([place]) 04/09/2014   A1c 6.5 (04/08/14)    Esophageal carcinoma ([place]) 03/2009   T1N1M0   GERD (gastroesophageal reflux disease)    Glaucoma    History of Doppler ultrasound 11/09/2011   03/2014- 50-69% L ICA stenosis;carotid doppler; L bulb/prox ICA 0-49% diameter reduction; L vertebral artery - occlusive ds; L ECA  demonstrates severe amount of fibrous plaque   History of Doppler ultrasound 11/09/11   LEAs; R ABI - mod art. insuff.; L ABI normal at rest; R SFA - occlusive ds; L SFA - occlusive ds; patent fem-pop graft   History of echocardiogram 08/27/2009   EF >55%   History of kidney stones    History of nuclear stress test 11/24/2011   lexiscan; normal perfusion; low risk scan; non-diagnostic for ischemia   Hyperlipidemia    Hypertension    Left carotid artery stenosis 04/08/2014   Pulmonary nodule, right 04/08/2014   2.8 mm-incidental finding on CT   PVD (peripheral vascular disease) ([place])    Tachyarrhythmia 1999   Status  post ablation at [facility]   Tobacco abuse     Outpatient Medications Prior to Visit  Medication Sig Dispense Refill   acetaminophen (TYLENOL) 500 MG tablet Take 500 mg by mouth daily as needed for headache.     albuterol (PROVENTIL) (2.5 MG/3ML) 0.083% nebulizer solution Take 3 mLs (2.5 mg total) by nebulization every 6 (six) hours as needed for wheezing or shortness of breath. 75 mL 12   ALPRAZolam (XANAX) 0.5 MG tablet Take 1 tablet (0.5 mg total) by mouth at bedtime. (Patient taking differently: Take 0.5 mg by mouth at bedtime as  needed. ) 30 tablet 0   amLODipine (NORVASC) 10 MG tablet Take 1 tablet (10 mg total) by mouth daily. 90 tablet 3   aspirin 81 MG EC tablet Take 81 mg by mouth at bedtime.      brimonidine (ALPHAGAN) 0.2 % ophthalmic solution Place 1 drop into both eyes at bedtime.      clopidogrel (PLAVIX) 75 MG tablet TAKE ONE (1) TABLET BY MOUTH EVERY DAY 90 tablet 3   dexlansoprazole (DEXILANT) 60 MG capsule Take 1 capsule (60 mg total) by mouth daily before supper. 30 capsule 11   diclofenac sodium (VOLTAREN) 1 % GEL Apply 2 g topically 4 (four) times daily. Rub into affected area of foot 2 to 4 times daily 100 g 2   guaiFENesin (MUCINEX PO) Take 1,200 mg by mouth every 12 (twelve) hours as needed.     latanoprost (XALATAN) 0.005 % ophthalmic solution Place 1 drop into both eyes at bedtime.      losartan (COZAAR) 100 MG tablet Take 1 tablet (100 mg total) by mouth at bedtime. 90 tablet 3   metoprolol succinate (TOPROL-XL) 50 MG 24 hr tablet TAKE ONE (1) TABLET BY MOUTH EVERY DAY 90 tablet 3   montelukast (SINGULAIR) 10 MG tablet Take 1 tablet (10 mg total) by mouth daily. 90 tablet 3   Multiple Vitamins-Minerals (CENTRUM SILVER PO) Take 1 tablet by mouth daily.       nitroGLYCERIN (NITROSTAT) 0.4 MG SL tablet Place 1 tablet (0.4 mg total) under the tongue every 5 (five) minutes as needed for chest pain. 25 tablet 3   rosuvastatin (CRESTOR) 10 MG tablet TAKE ONE (1) TABLET BY MOUTH EVERY DAY 90 tablet 3   sucralfate (CARAFATE) 1 G tablet Take 1 tablet (1 g total) by mouth 4 (four) times daily -  with meals and at bedtime. (Patient taking differently: Take 1 g by mouth 4 (four) times daily -  with meals and at bedtime. As needed) 40 tablet 0   umeclidinium-vilanterol (ANORO ELLIPTA) 62.5-25 MCG/INH AEPB Inhale 1 puff into the lungs daily. 60 each 11   amoxicillin-clavulanate (AUGMENTIN) 875-125 MG tablet      azithromycin (ZITHROMAX) 250 MG tablet      neomycin-polymyxin-hydrocortisone  (CORTISPORIN) 3.5-10000-1 OTIC suspension         Objective:     BP 132/70 (BP Location: Left Arm, Patient Position: Sitting, Cuff Size: Normal)    Pulse 77    Temp 98.2 F (36.8 C)    Ht 5\' 8"  (1.727 m)    Wt 157 lb 12.8 oz (71.6 kg)    SpO2 98%    BMI 23.99 kg/m   SpO2: 98 % RA    HEENT: nl dentition / oropharynx. Nl external ear canals without cough reflex -  Mild bilateral non-specific turbinate edema     NECK :  without JVD/Nodes/TM/ nl carotid upstrokes bilaterally   LUNGS:  no acc muscle use,  Mild barrel  contour chest wall with bilateral  Distant bs s audible wheeze and  without cough on insp or exp maneuver and mild  Hyperresonant  to  percussion bilaterally     CV:  RRR  no s3 or murmur or increase in P2, and no edema   ABD:  soft and nontender with pos end  insp Hoover's  in the supine position. No bruits or organomegaly appreciated, bowel sounds nl  MS:    ext warm without deformities, calf tenderness, cyanosis or clubbing No obvious joint restrictions   SKIN: warm and dry without lesions    NEURO:  alert, approp, nl sensorium with  no motor or cerebellar deficits apparent.    CXR PA and Lateral:   12/19/2018 :    I personally reviewed images and agree with radiology impression as follows:    COPD/chronic changes.  No active disease.       Assessment   No problem-specific Assessment & Plan notes found for this encounter.     Christinia Gully, MD 12/19/2018

## 2018-12-19 NOTE — Patient Instructions (Addendum)
Stop anoro to see if throat irritation is any better  Start bevespi Take 2 puffs first thing in am and then another 2 puffs about 12 hours later - call if don't like it for trial of stiolto or spiriva or resume anoro    Please remember to go to the  x-ray department  for your tests - we will call you with the results when they are available     You are cleared for surgery pending your cxr

## 2018-12-25 ENCOUNTER — Telehealth: Payer: Self-pay | Admitting: *Deleted

## 2018-12-25 ENCOUNTER — Encounter: Payer: Self-pay | Admitting: Internal Medicine

## 2018-12-25 NOTE — Telephone Encounter (Signed)
Called and spoke with pt letting him know that MW has cleared him for surgery and that OV was sent to [facility] and pt verbalized understanding. Nothing further needed.

## 2018-12-25 NOTE — Telephone Encounter (Signed)
-----   Message from Tanda Rockers, MD sent at 12/25/2018  6:16 AM EDT ----- Let him know I cleared him for surgery and sent at copy of ov to [facility]

## 2018-12-25 NOTE — Assessment & Plan Note (Addendum)
Quit smoking  1997  PFT's 03/2015   FEV1 2.24 (72 % ) ratio 522  p 3 % improvement from saba with DLCO  55 % corrects to 57 % for alv volume - 12/19/2018  After extensive coaching inhaler device,  effectiveness =    90% with dpi and 75% with hfa > try bevespi to reduce upper airway irritation   Pt is likely Pt is Group B in terms of symptom/risk and laba/lama therefore appropriate rx at this point >>>  Lama/laba approp but due to upper airway irritation tendencies I instructed on hfa and suggested he try bevesopi 2 bid if works as well and insurance covers > if not resume anoro   We don't have more recent spirometry and can't do routine studies due to [place] restrictions but even if we could there is no "red line" for non-thoracic surgery in terms of FEV1 being prohibitive in this setting and it is unlikely he's progressed very much since quit smoking.  Can use duoneb qid periop instead of bevespi or anoro   not available   >>> cleared for hip surgery and pulmonary f/u can be prn.   Total time devoted to counseling  > 50 % of initial 65 min office visit:  review case with pt/  device teaching which extended face to face time for this visit  discussion of options/alternatives/ personally creating written customized instructions  in presence of pt  then going over those specific  Instructions directly with the pt including how to use all of the meds but in particular covering each new medication in detail and the difference between the maintenance= "automatic" meds and the prns using an action plan format for the latter (If this problem/symptom => do that organization reading Left to right).  Please see AVS from this visit for a full list of these instructions which I personally wrote for this pt and  are unique to this visit.

## 2018-12-25 NOTE — Telephone Encounter (Signed)
LMTCB

## 2018-12-25 NOTE — Telephone Encounter (Signed)
Pt is calling back [phone]

## 2019-01-04 ENCOUNTER — Telehealth: Payer: Self-pay

## 2019-01-08 DIAGNOSIS — M25552 Pain in left hip: Secondary | ICD-10-CM | POA: Diagnosis not present

## 2019-01-08 NOTE — H&P (Signed)
HIP ARTHROPLASTY ADMISSION H&P  Patient ID: Gregory Eaton MRN: [id number] DOB/AGE: Nov 22, 1945 73 y.o.  Chief Complaint: left hip pain.  Planned Procedure Date: 01/30/19 Medical Clearance by Dr. Nevada Crane    Cardiac Clearance by Dr. Claiborne Billings Additional clearance by Pulmonology: Dr. Lenna Gilford   HPI: Gregory Eaton is a 73 y.o. male with a history of CAD, PVD s/p Left fem/pop bypass, HTN, esophogeal cancer s/p resection, CABG x5, GERD, and COPD who presents for evaluation of OA LEFT HIP. The patient has a history of pain and functional disability in the left hip due to arthritis and has failed non-surgical conservative treatments for greater than 12 weeks to include NSAID's and/or analgesics, corticosteriod injections and activity modification.  Onset of symptoms was gradual, starting 3 years ago with gradually worsening course since that time.  Patient currently rates pain at 9 out of 10 with activity. Patient has night pain, worsening of pain with activity and weight bearing and pain that interferes with activities of daily living.  Patient has evidence of subchondral cysts, subchondral sclerosis, periarticular osteophytes and joint space narrowing by imaging studies.  There is no active infection.  Past Medical History:  Diagnosis Date  . Adenomatous colon polyp 03/2009   Last colonoscopy by Dr. [name]   . Adenomatous polyp 2010  . Adenomatous polyp of colon 11/03/2010  . Arthritis   . Barrett's esophagus   . CAD (coronary artery disease)   . COPD (chronic obstructive pulmonary disease) (HCC)    Severe emphysema per CT  . Diverticulosis   . DM type 2 (diabetes mellitus, type 2) ([place]) 04/09/2014   A1c 6.5 (04/08/14)   . Esophageal carcinoma ([place]) 03/2009   T1N1M0  . GERD (gastroesophageal reflux disease)   . Glaucoma   . History of Doppler ultrasound 11/09/2011   03/2014- 50-69% L ICA stenosis;carotid doppler; L bulb/prox ICA 0-49% diameter reduction; L vertebral artery - occlusive ds; L ECA   demonstrates severe amount of fibrous plaque  . History of Doppler ultrasound 11/09/11   LEAs; R ABI - mod art. insuff.; L ABI normal at rest; R SFA - occlusive ds; L SFA - occlusive ds; patent fem-pop graft  . History of echocardiogram 08/27/2009   EF >55%  . History of kidney stones   . History of nuclear stress test 11/24/2011   lexiscan; normal perfusion; low risk scan; non-diagnostic for ischemia  . Hyperlipidemia   . Hypertension   . Left carotid artery stenosis 04/08/2014  . Pulmonary nodule, right 04/08/2014   2.8 mm-incidental finding on CT  . PVD (peripheral vascular disease) ([place])   . Tachyarrhythmia 1999   Status post ablation at [facility]  . Tobacco abuse    Past Surgical History:  Procedure Laterality Date  . BIOPSY  10/24/2017   Procedure: BIOPSY;  Surgeon: [name], MD;  Location: [facility];  Service: Endoscopy;;  esophagus  . COLONOSCOPY  03/17/2009   Dr.Rourk- normal rectum, sigmoid diverticula, some pale sigmoid mucosa with diffuse petechiae. pedunculated polyp at the splenic flexure, remainder of colonic mucosa appeared normal. bx= adenomatous polyp  . COLONOSCOPY  04/19/2003   Dr.Rehman- few diverticu;a at the sigmoid colon, 3 small polyps, one at the transverse colon and 2 at the sigmoid, small external hemorrhoids. bx report not available.   . COLONOSCOPY WITH PROPOFOL N/A 10/24/2017   Dr. [name]: Diverticulosis, 11 mm polyp at the ileocecal valve, tubular adenoma.  Repeat colonoscopy in 3 years  . [place]  [name]  . ESOPHAGECTOMY  2010   [facility] Dr. [name]  . ESOPHAGOGASTRODUODENOSCOPY  11/25/2010   Dr.Rourk- s/p esophagectomy with gastric pull-up, esophageal erosions straddling the surgical anastomosis, salmon colored epithelium coming up a good centimeter to a centimeter and a half above the suture line, islands of salmon colored epithelium in the most poximal residual esophagus, remainder of gastric mucosa appeared normal. bx=  swamous &gastric glandular mucosa w/chronic active inflammation  . ESOPHAGOGASTRODUODENOSCOPY  03/17/2009   Dr.Rourk- 4cm segment of salmon-colored epithlium distal esophagus suspicious for barretts esophagus. area of suspicious nodularity w/in this segment bx seperately. small to moderate size hiatal hernia, o/w normal stomach D1 and D2 bx=adenocarcinoma  . ESOPHAGOGASTRODUODENOSCOPY (EGD) WITH PROPOFOL N/A 10/24/2017   Dr. [name]: Remnant of esophagus with anastomosis with stomach at 24 cm from the incisors, 2 cm above the anastomosis he was found to have Barrett's but no dysplasia.  Advised for repeat EGD in 3 years  . FEMORAL-POPLITEAL BYPASS GRAFT  1993   occlusive ds in R SFA  . GASTRIC PULL THROUGH  2010   With esophagectomy  . LIPOMA EXCISION  2010  . POLYPECTOMY  10/24/2017   Procedure: POLYPECTOMY;  Surgeon: [name], MD;  Location: [facility];  Service: Endoscopy;;  colon    Allergies  Allergen Reactions  . Altace [Ramipril] Cough   Prior to Admission medications   Medication Sig Start Date End Date Taking? Authorizing Provider  acetaminophen (TYLENOL) 500 MG tablet Take 500 mg by mouth daily as needed for headache.    [provider]  albuterol (PROVENTIL) (2.5 MG/3ML) 0.083% nebulizer solution Take 3 mLs (2.5 mg total) by nebulization every 6 (six) hours as needed for wheezing or shortness of breath. 05/09/18   Noralee Space, MD  ALPRAZolam Duanne Moron) 0.5 MG tablet Take 1 tablet (0.5 mg total) by mouth at bedtime. Patient taking differently: Take 0.5 mg by mouth at bedtime as needed.  10/07/14   Troy Sine, MD  amLODipine (NORVASC) 10 MG tablet Take 1 tablet (10 mg total) by mouth daily. 07/05/18   Troy Sine, MD  amoxicillin-clavulanate (AUGMENTIN) 875-125 MG tablet  07/21/18   [provider]  aspirin 81 MG EC tablet Take 81 mg by mouth at bedtime.     [provider]  azithromycin (ZITHROMAX) 250 MG tablet  08/15/18   [provider]  brimonidine (ALPHAGAN) 0.2 % ophthalmic solution Place 1 drop into both eyes at bedtime.     [provider]  clopidogrel (PLAVIX) 75 MG tablet TAKE ONE (1) TABLET BY MOUTH EVERY DAY 01/05/18   [name], PA  dexlansoprazole (DEXILANT) 60 MG capsule Take 1 capsule (60 mg total) by mouth daily before supper. 06/12/18   Mahala Menghini, PA-C  diclofenac sodium (VOLTAREN) 1 % GEL Apply 2 g topically 4 (four) times daily. Rub into affected area of foot 2 to 4 times daily 10/30/18   Trula Slade, DPM  Glycopyrrolate-Formoterol (BEVESPI AEROSPHERE) 9-4.8 MCG/ACT AERO Inhale 2 puffs into the lungs 2 (two) times a day. 12/19/18   Tanda Rockers, MD  guaiFENesin (MUCINEX PO) Take 1,200 mg by mouth every 12 (twelve) hours as needed.    [provider]  latanoprost (XALATAN) 0.005 % ophthalmic solution Place 1 drop into both eyes at bedtime.  01/10/13   [provider]  losartan (COZAAR) 100 MG tablet Take 1 tablet (100 mg total) by mouth at bedtime. 01/05/18   [name], [place]  metoprolol succinate (TOPROL-XL) 50 MG 24 hr tablet TAKE ONE (1) TABLET BY MOUTH EVERY DAY 01/05/18   [name], PA  montelukast (SINGULAIR) 10 MG tablet Take 1 tablet (10 mg total) by mouth daily. 10/25/18   Noralee Space, MD  Multiple Vitamins-Minerals (CENTRUM SILVER PO) Take 1 tablet by mouth daily.      [provider]  neomycin-polymyxin-hydrocortisone (CORTISPORIN) 3.5-10000-1 OTIC suspension  07/21/18   [provider]  nitroGLYCERIN (NITROSTAT) 0.4 MG SL tablet Place 1 tablet (0.4 mg total) under the tongue every 5 (five) minutes as needed for chest pain. 01/05/18   [name], PA  rosuvastatin (CRESTOR) 10 MG tablet TAKE ONE (1) TABLET BY MOUTH EVERY DAY 01/05/18   [name], PA  sucralfate (CARAFATE) 1 G tablet Take 1 tablet (1 g total) by mouth 4 (four) times daily -  with meals and at bedtime. Patient taking differently: Take 1 g by mouth 4 (four) times daily -  with meals and at  bedtime. As needed 03/15/14   Milton Ferguson, MD   Social History   Socioeconomic History  . Marital status: Married    Spouse name: Not on file  . Number of children: 2  . Years of education: Not on file  . Highest education level: Not on file  Occupational History  . 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 Financial resource strain: Not on file  . Food insecurity    Worry: Not on file    Inability: Not on file  . Transportation needs    Medical: Not on file    Non-medical: Not on file  Tobacco Use  . Smoking status: Former Smoker    Packs/day: 2.00    Years: 40.00    Pack years: 80.00    Types: Cigarettes    Quit date: 07/20/1995    Years since quitting: 23.4  . Smokeless tobacco: Never Used  Substance and Sexual Activity  . Alcohol use: No    Alcohol/week: 0.0 standard drinks  . Drug use: No  . Sexual activity: Yes    Partners: Female    Birth control/protection: Condom    Comment: friend  Lifestyle  . Physical activity    Days per week: Not on file    Minutes per session: Not on file  . Stress: Not on file  Relationships  . Social Herbalist on phone: Not on file    Gets together: Not on file    Attends religious service: Not on file    Active member of club or organization: Not on file    Attends meetings of clubs or organizations: Not on file    Relationship status: Not on file  Other Topics Concern  . Not on file  Social History Narrative  . Not on file   Family History  Problem Relation Age of Onset  . GER disease Mother   . Coronary artery disease Brother   . Congenital heart disease Sister   . Colon cancer Neg Hx     ROS: Currently denies lightheadedness, dizziness, Fever, chills, CP, SOB.   No personal history of DVT, PE, CVA. No loose teeth.  Top Partial.  He wears glasses, has reduced hearing, and bruises easily. All other systems have been reviewed and were otherwise currently negative  with the exception of those mentioned in the HPI and as above.  Objective: Vitals: Ht: 5 feet 8 inches wt:  159 temp: 97.6 BP: 154/74 pulse: 70 O2 97 % on room air.   Physical Exam: General: Alert, NAD. Trendelenberg Gait  HEENT: EOMI, Good Neck Extension  Pulm: No increased work of breathing.  Clear B/L A/P w/o crackle or wheeze.  CV: RRR, No m/g/r appreciated  GI: soft, NT, ND Neuro: Neuro without gross focal deficit.  Sensation intact distally Skin: No lesions in the area of chief complaint MSK/Surgical Site: Left Hip pain with passive ROM.  Positive Stinchfield.  5/5 strength.  NVI.  Sensation intact distally.  Imaging Review Plain radiographs demonstrate severe degenerative joint disease of the left hip.   Preoperative templating of the joint replacement has been completed, documented, and submitted to the Operating Room personnel in order to optimize intra-operative equipment management.  Assessment: OA LEFT HIP Active Problems:   Essential hypertension   Coronary atherosclerosis   GERD   Left carotid artery stenosis   DM type 2 (diabetes mellitus, type 2) (HCC)   S/P CABG x 5   COPD GOLD II    Cancer of abdominal esophagus ([place])   Plan: Plan for Procedure(s): TOTAL HIP ARTHROPLASTY  The patient history, physical exam, clinical judgement of the provider and imaging are consistent with end stage degenerative joint disease and total joint arthroplasty is deemed medically necessary. The treatment options including medical management, injection therapy, and arthroplasty were discussed at length. The risks and benefits of Procedure(s): TOTAL HIP ARTHROPLASTY were presented and reviewed.  The risks of nonoperative treatment, versus surgical intervention including but not limited to continued pain, aseptic loosening, stiffness, dislocation/subluxation, infection, bleeding, nerve injury, blood clots, cardiopulmonary complications, morbidity, mortality, among others were discussed.  The patient verbalizes understanding and wishes to proceed with the plan.  Patient is being admitted for surgery, pain control, PT, prophylactic antibiotics, VTE prophylaxis, progressive ambulation, ADL's and discharge planning.   Dental prophylaxis discussed and recommended for 2 years postoperatively.   The patient does meet the criteria for TXA which will be used perioperatively.    ASA 81 mg BID will be used postoperatively for DVT prophylaxis in addition to SCDs, and early ambulation.  Resume Plavix postoperatively.  GERD severe with history of esophagectomy-does not tolerate lying flat.  Avoid NSAIDs dt heart history.  Plan for Norco and 100 mg gabapentin for pain.  Baclofen for spasm.    The patient is planning to be discharged home with HHPT ([facility]) in care of his wife.  Anticipated LOS less than 2 midnights.  Inpatient approval due to: - Age 25 and older with one or more of the following:  - Obesity  - Expected need for hospital services (PT, OT, Nursing) required for safe  discharge  - Anticipated need for postoperative skilled nursing care or inpatient rehab  - Active co-morbidities: Coronary Artery Disease, Heart Attack and Respiratory Failure/COPD   Prudencio Burly III, PA-C 01/08/2019 8:27 AM

## 2019-01-08 NOTE — Telephone Encounter (Signed)
Gregory Eaton is a 73 y.o. male reports to clinical pharmacist appointment for COPD medication management.   Patient's current COPD medication regimen consists of: bevespi, patient correctly reports how to use each inhaler. Takes Bevespi twice a day. Doctor changed because said patient's throat was irritated.  Patient denies COPD related symptoms. Symptoms include none.  Patient denies use of rescue inhaler with an estimated use at about 0 times per week. Has not used for 2-3 month.  Patient denies occurrence of a COPD exacerbation since last appointment.   Patient reports a history of smoking with a current pack-per year of 66. Has not smoked since 1997.  Allergies  Allergen Reactions  . Altace [Ramipril] Cough    Current Outpatient Medications:  .  acetaminophen (TYLENOL) 500 MG tablet, Take 500 mg by mouth daily as needed for headache., Disp: , Rfl:  .  albuterol (PROVENTIL) (2.5 MG/3ML) 0.083% nebulizer solution, Take 3 mLs (2.5 mg total) by nebulization every 6 (six) hours as needed for wheezing or shortness of breath., Disp: 75 mL, Rfl: 12 .  ALPRAZolam (XANAX) 0.5 MG tablet, Take 1 tablet (0.5 mg total) by mouth at bedtime. (Patient taking differently: Take 0.5 mg by mouth at bedtime as needed. ), Disp: 30 tablet, Rfl: 0 .  amLODipine (NORVASC) 10 MG tablet, Take 1 tablet (10 mg total) by mouth daily., Disp: 90 tablet, Rfl: 3 .  amoxicillin-clavulanate (AUGMENTIN) 875-125 MG tablet, , Disp: , Rfl:  .  aspirin 81 MG EC tablet, Take 81 mg by mouth at bedtime. , Disp: , Rfl:  .  azithromycin (ZITHROMAX) 250 MG tablet, , Disp: , Rfl:  .  brimonidine (ALPHAGAN) 0.2 % ophthalmic solution, Place 1 drop into both eyes at bedtime. , Disp: , Rfl:  .  clopidogrel (PLAVIX) 75 MG tablet, TAKE ONE (1) TABLET BY MOUTH EVERY DAY, Disp: 90 tablet, Rfl: 3 .  dexlansoprazole (DEXILANT) 60 MG capsule, Take 1 capsule (60 mg total) by mouth daily before supper., Disp: 30 capsule, Rfl: 11 .   diclofenac sodium (VOLTAREN) 1 % GEL, Apply 2 g topically 4 (four) times daily. Rub into affected area of foot 2 to 4 times daily, Disp: 100 g, Rfl: 2 .  Glycopyrrolate-Formoterol (BEVESPI AEROSPHERE) 9-4.8 MCG/ACT AERO, Inhale 2 puffs into the lungs 2 (two) times a day., Disp: 1 Inhaler, Rfl: 11 .  guaiFENesin (MUCINEX PO), Take 1,200 mg by mouth every 12 (twelve) hours as needed., Disp: , Rfl:  .  latanoprost (XALATAN) 0.005 % ophthalmic solution, Place 1 drop into both eyes at bedtime. , Disp: , Rfl:  .  losartan (COZAAR) 100 MG tablet, Take 1 tablet (100 mg total) by mouth at bedtime., Disp: 90 tablet, Rfl: 3 .  metoprolol succinate (TOPROL-XL) 50 MG 24 hr tablet, TAKE ONE (1) TABLET BY MOUTH EVERY DAY, Disp: 90 tablet, Rfl: 3 .  montelukast (SINGULAIR) 10 MG tablet, Take 1 tablet (10 mg total) by mouth daily., Disp: 90 tablet, Rfl: 3 .  Multiple Vitamins-Minerals (CENTRUM SILVER PO), Take 1 tablet by mouth daily.  , Disp: , Rfl:  .  neomycin-polymyxin-hydrocortisone (CORTISPORIN) 3.5-10000-1 OTIC suspension, , Disp: , Rfl:  .  nitroGLYCERIN (NITROSTAT) 0.4 MG SL tablet, Place 1 tablet (0.4 mg total) under the tongue every 5 (five) minutes as needed for chest pain., Disp: 25 tablet, Rfl: 3 .  rosuvastatin (CRESTOR) 10 MG tablet, TAKE ONE (1) TABLET BY MOUTH EVERY DAY, Disp: 90 tablet, Rfl: 3 .  sucralfate (CARAFATE) 1 G tablet, Take  1 tablet (1 g total) by mouth 4 (four) times daily -  with meals and at bedtime. (Patient taking differently: Take 1 g by mouth 4 (four) times daily -  with meals and at bedtime. As needed), Disp: 40 tablet, Rfl: 0 Past Medical History:  Diagnosis Date  . Adenomatous colon polyp 03/2009   Last colonoscopy by Dr. [name]   . Adenomatous polyp 2010  . Adenomatous polyp of colon 11/03/2010  . Arthritis   . Barrett's esophagus   . CAD (coronary artery disease)   . COPD (chronic obstructive pulmonary disease) (HCC)    Severe emphysema per CT  . Diverticulosis   . DM  type 2 (diabetes mellitus, type 2) ([place]) 04/09/2014   A1c 6.5 (04/08/14)   . Esophageal carcinoma ([place]) 03/2009   T1N1M0  . GERD (gastroesophageal reflux disease)   . Glaucoma   . History of Doppler ultrasound 11/09/2011   03/2014- 50-69% L ICA stenosis;carotid doppler; L bulb/prox ICA 0-49% diameter reduction; L vertebral artery - occlusive ds; L ECA  demonstrates severe amount of fibrous plaque  . History of Doppler ultrasound 11/09/11   LEAs; R ABI - mod art. insuff.; L ABI normal at rest; R SFA - occlusive ds; L SFA - occlusive ds; patent fem-pop graft  . History of echocardiogram 08/27/2009   EF >55%  . History of kidney stones   . History of nuclear stress test 11/24/2011   lexiscan; normal perfusion; low risk scan; non-diagnostic for ischemia  . Hyperlipidemia   . Hypertension   . Left carotid artery stenosis 04/08/2014  . Pulmonary nodule, right 04/08/2014   2.8 mm-incidental finding on CT  . PVD (peripheral vascular disease) ([place])   . Tachyarrhythmia 1999   Status post ablation at [facility]  . Tobacco abuse    Social History   Socioeconomic History  . Marital status: Married    Spouse name: Not on file  . Number of children: 2  . Years of education: Not on file  . Highest education level: Not on file  Occupational History  . 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 Financial resource strain: Not on file  . Food insecurity    Worry: Not on file    Inability: Not on file  . Transportation needs    Medical: Not on file    Non-medical: Not on file  Tobacco Use  . Smoking status: Former Smoker    Packs/day: 2.00    Years: 40.00    Pack years: 80.00    Types: Cigarettes    Quit date: 07/20/1995    Years since quitting: 23.4  . Smokeless tobacco: Never Used  Substance and Sexual Activity  . Alcohol use: No    Alcohol/week: 0.0 standard drinks  . Drug use: No  . Sexual activity: Yes    Partners: Female    Birth control/protection:  Condom    Comment: friend  Lifestyle  . Physical activity    Days per week: Not on file    Minutes per session: Not on file  . Stress: Not on file  Relationships  . Social Herbalist on phone: Not on file    Gets together: Not on file    Attends religious service: Not on file    Active member of club or organization: Not on file    Attends meetings of clubs or organizations: Not on file    Relationship status:  Not on file  Other Topics Concern  . Not on file  Social History Narrative  . Not on file   Family History  Problem Relation Age of Onset  . GER disease Mother   . Coronary artery disease Brother   . Congenital heart disease Sister   . Colon cancer Neg Hx     O: Ht Readings from Last 2 Encounters:  12/19/18 5\' 8"  (1.727 m)  07/05/18 5\' 8"  (1.727 m)   Wt Readings from Last 2 Encounters:  12/19/18 157 lb 12.8 oz (71.6 kg)  07/05/18 155 lb 9.6 oz (70.6 kg)   There is no height or weight on file to calculate BMI.  CAT ASSESSMENT  Rank each of the following items on a scale of 0 to 5 (with 5 being most severe) Write a # 0-5 in each box  I never cough (0) > I cough all the time (5) 1  I have no phlegm (mucus) in my chest (0) > My chest is completely full of phlegm (mucus) (5) 0  My chest does not feel tight at all (0) > My chest feels very tight (5) 0  When I walk up a hill or one flight of stairs I am not breathless (0) > When I walk up a hill or one flight of stairs I am very breathless (5) 1  I am not limited doing any activities at home (0) > I am very limited doing activities at home (5) 0  I am confident leaving my home despite my lung function (0) > I am not at all confident leaving my home because of my lung condition (5)  0  I sleep soundly (0) > I don't sleep soundly because of my lung condition (5) 0  I have lots of energy (0) > I have no energy at all (5) 0    Total CAT Score: 2 Most recent blood eosinophil count was 0 cells/microL taken on  05/25/2016.  A/P:  Findings/Recommendations: no therapy changes . Changes made to patient's COPD regimen include none . Counseled patient to report any changes in COPD medications, breathing, or shortness of breath.   An after visit summary was provided and patient advised to follow up in 4-6 weeks or sooner if any changes in condition or questions regarding medications arise.   The patient verbalized understanding of information provided by repeating back concepts discussed.

## 2019-01-09 NOTE — Telephone Encounter (Signed)
Patient was contacted by Lorenso Courier, PharmD candidate. I agree with the assessment and plan of care documented. Signing off due to improved/stable symptoms. Feel free to consult clinical pharmacy again in the future if needed.

## 2019-01-12 ENCOUNTER — Telehealth: Payer: Self-pay | Admitting: Physician Assistant

## 2019-01-12 NOTE — Telephone Encounter (Signed)
LVM for  Pt, reminding him of his appt on 01-15-19 with [name].

## 2019-01-15 ENCOUNTER — Ambulatory Visit (INDEPENDENT_AMBULATORY_CARE_PROVIDER_SITE_OTHER): Payer: Medicare Other | Admitting: Physician Assistant

## 2019-01-15 ENCOUNTER — Other Ambulatory Visit: Payer: Self-pay

## 2019-01-15 VITALS — BP 152/76 | Temp 98.1°F | Ht 68.0 in | Wt 159.0 lb

## 2019-01-15 DIAGNOSIS — E785 Hyperlipidemia, unspecified: Secondary | ICD-10-CM

## 2019-01-15 DIAGNOSIS — I10 Essential (primary) hypertension: Secondary | ICD-10-CM | POA: Diagnosis not present

## 2019-01-15 DIAGNOSIS — I739 Peripheral vascular disease, unspecified: Secondary | ICD-10-CM

## 2019-01-15 DIAGNOSIS — I25810 Atherosclerosis of coronary artery bypass graft(s) without angina pectoris: Secondary | ICD-10-CM | POA: Diagnosis not present

## 2019-01-15 DIAGNOSIS — E041 Nontoxic single thyroid nodule: Secondary | ICD-10-CM

## 2019-01-15 DIAGNOSIS — M25559 Pain in unspecified hip: Secondary | ICD-10-CM

## 2019-01-15 DIAGNOSIS — E119 Type 2 diabetes mellitus without complications: Secondary | ICD-10-CM

## 2019-01-15 DIAGNOSIS — R911 Solitary pulmonary nodule: Secondary | ICD-10-CM

## 2019-01-15 NOTE — Progress Notes (Signed)
Cardiology Office Note    Date:  01/17/2019   ID:  Abem, Shaddix July 09, 1946, MRN [id number]  PCP:  Celene Squibb, MD  Cardiologist:  Dr. Claiborne Billings   Chief Complaint  Patient presents with  . Follow-up    6 month followup Dr. Claiborne Billings    History of Present Illness:  Gregory Eaton is a 73 y.o. male with past medical history of CAD s/p CABG 1998, history of esophageal cancer surgery in December 2010, DM 2, GERD, PAD s/p femoral-popliteal bypass surgery, COPD, hypertension, hyperlipidemia, tachyarrhythmia s/p ablation.  He has a documented history of occluded left vertebral artery.  Perfusion study in January 2015 was normal without scar or ischemia.  CT of abdomen pelvis obtained in May 2019 showed 8 mm right lower lobe nodule unchanged when compared to 2015, 9 mm adrenal nodule unchanged when compared to 2015 compatible with benign adrenal adenoma.  He was last seen by Dr. Claiborne Billings in December 2019, amlodipine was increased to 10 mg for better blood pressure control.  Last carotid ultrasound obtained in September 2019 showed 1 to 39% disease bilaterally, left vertebral artery was not visualized, right vertebral artery demonstrated antegrade flow.  Thyroid ultrasound obtained in October 2019 showed 1 cm right superior nodule, follow-up study recommended in 1 year.  Lower extremity Doppler obtained in April 2020 showed moderate to severe disease in the right lower extremity, moderate disease in the left lower extremity.  Overall this is unchanged when compared to the previous Doppler from a year ago.  Patient presents today for follow-up, he denies any chest pain or shortness of breath.  He does have hip pain with exercise.  He has upcoming total hip arthroplasty by Dr. Percell Miller.  He is already cleared for the surgery.  He will need to hold aspirin and Plavix for 7 days prior to the surgery and restart them as soon as possible afterward.  His blood pressure in the clinic remains elevated today at 152/76.   However right prior to him leaving home, his systolic blood pressure was in 130s.  He just took his blood pressure medication about an hour prior to his office visit.  I will hold off on titrating his blood pressure medication.  He will continue on amlodipine, losartan and metoprolol.  He has a follow-up with Dr. Nevada Crane in July 2020, I will defer lipid panel to his PCP.  Overall, I think he is doing very well from cardiology perspective.  He does have some thigh weakness with walking, however he is not sure if this is related to his hip issue versus vascular issue.  Given unchanged lower extremity Doppler from April 2020, I recommended continue observation and see how he does after he recovers from the hip surgery.  He can follow-up with Dr. Claiborne Billings in 6 months   Past Medical History:  Diagnosis Date  . Adenomatous colon polyp 03/2009   Last colonoscopy by Dr. [name]   . Adenomatous polyp 2010  . Adenomatous polyp of colon 11/03/2010  . Arthritis   . Barrett's esophagus   . CAD (coronary artery disease)   . COPD (chronic obstructive pulmonary disease) (HCC)    Severe emphysema per CT  . Diverticulosis   . DM type 2 (diabetes mellitus, type 2) ([place]) 04/09/2014   A1c 6.5 (04/08/14)   . Esophageal carcinoma ([place]) 03/2009   T1N1M0  . GERD (gastroesophageal reflux disease)   . Glaucoma   . History of Doppler ultrasound 11/09/2011   03/2014-  50-69% L ICA stenosis;carotid doppler; L bulb/prox ICA 0-49% diameter reduction; L vertebral artery - occlusive ds; L ECA  demonstrates severe amount of fibrous plaque  . History of Doppler ultrasound 11/09/11   LEAs; R ABI - mod art. insuff.; L ABI normal at rest; R SFA - occlusive ds; L SFA - occlusive ds; patent fem-pop graft  . History of echocardiogram 08/27/2009   EF >55%  . History of kidney stones   . History of nuclear stress test 11/24/2011   lexiscan; normal perfusion; low risk scan; non-diagnostic for ischemia  . Hyperlipidemia   . Hypertension   . Left  carotid artery stenosis 04/08/2014  . Pulmonary nodule, right 04/08/2014   2.8 mm-incidental finding on CT  . PVD (peripheral vascular disease) ([place])   . Tachyarrhythmia 1999   Status post ablation at [facility]  . Tobacco abuse     Past Surgical History:  Procedure Laterality Date  . BIOPSY  10/24/2017   Procedure: BIOPSY;  Surgeon: [name], MD;  Location: [facility];  Service: Endoscopy;;  esophagus  . COLONOSCOPY  03/17/2009   Dr.Rourk- normal rectum, sigmoid diverticula, some pale sigmoid mucosa with diffuse petechiae. pedunculated polyp at the splenic flexure, remainder of colonic mucosa appeared normal. bx= adenomatous polyp  . COLONOSCOPY  04/19/2003   Dr.Rehman- few diverticu;a at the sigmoid colon, 3 small polyps, one at the transverse colon and 2 at the sigmoid, small external hemorrhoids. bx report not available.   . COLONOSCOPY WITH PROPOFOL N/A 10/24/2017   Dr. [name]: Diverticulosis, 11 mm polyp at the ileocecal valve, tubular adenoma.  Repeat colonoscopy in 3 years  . CORONARY ARTERY BYPASS GRAFT  1998   [name]  . ESOPHAGECTOMY  2010   [facility] Dr. [name]  . ESOPHAGOGASTRODUODENOSCOPY  11/25/2010   Dr.Rourk- s/p esophagectomy with gastric pull-up, esophageal erosions straddling the surgical anastomosis, salmon colored epithelium coming up a good centimeter to a centimeter and a half above the suture line, islands of salmon colored epithelium in the most poximal residual esophagus, remainder of gastric mucosa appeared normal. bx= swamous &gastric glandular mucosa w/chronic active inflammation  . ESOPHAGOGASTRODUODENOSCOPY  03/17/2009   Dr.Rourk- 4cm segment of salmon-colored epithlium distal esophagus suspicious for barretts esophagus. area of suspicious nodularity w/in this segment bx seperately. small to moderate size hiatal hernia, o/w normal stomach D1 and D2 bx=adenocarcinoma  . ESOPHAGOGASTRODUODENOSCOPY (EGD) WITH PROPOFOL N/A 10/24/2017   Dr. [name]: Remnant of  esophagus with anastomosis with stomach at 24 cm from the incisors, 2 cm above the anastomosis he was found to have Barrett's but no dysplasia.  Advised for repeat EGD in 3 years  . FEMORAL-POPLITEAL BYPASS GRAFT  1993   occlusive ds in R SFA  . GASTRIC PULL THROUGH  2010   With esophagectomy  . LIPOMA EXCISION  2010  . POLYPECTOMY  10/24/2017   Procedure: POLYPECTOMY;  Surgeon: [name], MD;  Location[facility];  Service: Endoscopy;;  colon     Current Medications: Outpatient Medications Prior to Visit  Medication Sig Dispense Refill  . acetaminophen (TYLENOL) 500 MG tablet Take 500 mg by mouth daily as needed for headache.    . albuterol (PROVENTIL) (2.5 MG/3ML) 0.083% nebulizer solution Take 3 mLs (2.5 mg total) by nebulization every 6 (six) hours as needed for wheezing or shortness of breath. 75 mL 12  . ALPRAZolam (XANAX) 0.5 MG tablet Take 1 tablet (0.5 mg total) by mouth at bedtime. 30 tablet 0  . amLODipine (  NORVASC) 10 MG tablet Take 1 tablet (10 mg total) by mouth daily. 90 tablet 3  . aspirin 81 MG EC tablet Take 81 mg by mouth at bedtime.     . brimonidine (ALPHAGAN) 0.2 % ophthalmic solution Place 1 drop into the right eye at bedtime.     . clopidogrel (PLAVIX) 75 MG tablet TAKE ONE (1) TABLET BY MOUTH EVERY DAY 90 tablet 3  . dexlansoprazole (DEXILANT) 60 MG capsule Take 1 capsule (60 mg total) by mouth daily before supper. 30 capsule 11  . diclofenac sodium (VOLTAREN) 1 % GEL Apply 2 g topically 4 (four) times daily. Rub into affected area of foot 2 to 4 times daily (Patient taking differently: Apply 2 g topically 4 (four) times daily as needed (pain). Rub into affected area of foot 2 to 4 times daily) 100 g 2  . Glycopyrrolate-Formoterol (BEVESPI AEROSPHERE) 9-4.8 MCG/ACT AERO Inhale 2 puffs into the lungs 2 (two) times a day. (Patient taking differently: Inhale 2 puffs into the lungs daily. ) 1 Inhaler 11  . guaiFENesin (MUCINEX) 600 MG 12 hr tablet Take 1,200 mg by  mouth every 12 (twelve) hours as needed (cough).     . latanoprost (XALATAN) 0.005 % ophthalmic solution Place 1 drop into both eyes at bedtime.     Marland Kitchen losartan (COZAAR) 100 MG tablet Take 1 tablet (100 mg total) by mouth at bedtime. 90 tablet 3  . metoprolol succinate (TOPROL-XL) 50 MG 24 hr tablet TAKE ONE (1) TABLET BY MOUTH EVERY DAY 90 tablet 3  . montelukast (SINGULAIR) 10 MG tablet Take 1 tablet (10 mg total) by mouth daily. 90 tablet 3  . Multiple Vitamins-Minerals (CENTRUM SILVER PO) Take 1 tablet by mouth daily.      . nitroGLYCERIN (NITROSTAT) 0.4 MG SL tablet Place 1 tablet (0.4 mg total) under the tongue every 5 (five) minutes as needed for chest pain. 25 tablet 3  . rosuvastatin (CRESTOR) 10 MG tablet TAKE ONE (1) TABLET BY MOUTH EVERY DAY 90 tablet 3  . amoxicillin-clavulanate (AUGMENTIN) 875-125 MG tablet     . azithromycin (ZITHROMAX) 250 MG tablet     . neomycin-polymyxin-hydrocortisone (CORTISPORIN) 3.5-10000-1 OTIC suspension     . sucralfate (CARAFATE) 1 G tablet Take 1 tablet (1 g total) by mouth 4 (four) times daily -  with meals and at bedtime. (Patient taking differently: Take 1 g by mouth 4 (four) times daily -  with meals and at bedtime. As needed) 40 tablet 0   No facility-administered medications prior to visit.      Allergies:   Altace [ramipril]   Social History   Socioeconomic History  . Marital status: Married    Spouse name: Not on file  . Number of children: 2  . Years of education: Not on file  . Highest education level: Not on file  Occupational History  . 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 Financial resource strain: Not on file  . Food insecurity    Worry: Not on file    Inability: Not on file  . Transportation needs    Medical: Not on file    Non-medical: Not on file  Tobacco Use  . Smoking status: Former Smoker    Packs/day: 2.00    Years: 40.00    Pack years: 80.00    Types: Cigarettes     Quit date: 07/20/1995    Years since quitting: 23.5  .  Smokeless tobacco: Never Used  Substance and Sexual Activity  . Alcohol use: No    Alcohol/week: 0.0 standard drinks  . Drug use: No  . Sexual activity: Yes    Partners: Female    Birth control/protection: Condom    Comment: friend  Lifestyle  . Physical activity    Days per week: Not on file    Minutes per session: Not on file  . Stress: Not on file  Relationships  . Social Herbalist on phone: Not on file    Gets together: Not on file    Attends religious service: Not on file    Active member of club or organization: Not on file    Attends meetings of clubs or organizations: Not on file    Relationship status: Not on file  Other Topics Concern  . Not on file  Social History Narrative  . Not on file     Family History:  The patient's family history includes Congenital heart disease in his sister; Coronary artery disease in his brother; GER disease in his mother.   ROS:   Please see the history of present illness.    ROS All other systems reviewed and are negative.   PHYSICAL EXAM:   VS:  BP (!) 152/76   Temp 98.1 F (36.7 C)   Ht 5\' 8"  (1.727 m)   Wt 159 lb (72.1 kg)   SpO2 95%   BMI 24.18 kg/m    GEN: Well nourished, well developed, in no acute distress  HEENT: normal  Neck: no JVD, carotid bruits, or masses Cardiac: RRR; no murmurs, rubs, or gallops,no edema  Respiratory:  clear to auscultation bilaterally, normal work of breathing GI: soft, nontender, nondistended, + BS MS: no deformity or atrophy  Skin: warm and dry, no rash Neuro:  Alert and Oriented x 3, Strength and sensation are intact Psych: euthymic mood, full affect  Wt Readings from Last 3 Encounters:  01/15/19 159 lb (72.1 kg)  12/19/18 157 lb 12.8 oz (71.6 kg)  07/05/18 155 lb 9.6 oz (70.6 kg)      Studies/Labs Reviewed:   EKG:  EKG is ordered today.  The ekg ordered today demonstrates normal sinus rhythm with right  bundle branch block  Recent Labs: No results found for requested labs within last 8760 hours.   Lipid Panel    Component Value Date/Time   CHOL 96 01/31/2013 0940   TRIG 78 01/31/2013 0940   HDL 37 (L) 01/31/2013 0940   CHOLHDL 2.6 01/31/2013 0940   VLDL 16 01/31/2013 0940   LDLCALC 43 01/31/2013 0940    Additional studies/ records that were reviewed today include:   Myoview 07/24/2013 Impression Exercise Capacity:  Lexiscan with no exercise. BP Response:  Normal blood pressure response. Clinical Symptoms:  No significant symptoms noted. ECG Impression:  No significant ST segment change suggestive of ischemia. Comparison with Prior Nuclear Study: No significant change from previous study   Overall Impression:  Normal stress nuclear study.     Thyroid US 04/21/2018 IMPRESSION: 1 cm right superior TR 4 nodule meets criteria follow-up in 1 year.    ABI 11/06/2018 Right and Left ABIs appear essentially unchanged compared to prior study on 11/14/2017.  Right and Left TBIs appear essentially unchanged compared to prior study on 11/14/2017.   Summary: Right: Resting right ankle-brachial index indicates noncompressible right lower extremity arteries. The right toe-brachial index is abnormal.   Left: Resting left ankle-brachial index indicates noncompressible left lower  extremity arteries. The left toe-brachial index is abnormal  ASSESSMENT:    1. Coronary artery disease involving coronary bypass graft of native heart without angina pectoris   2. Controlled type 2 diabetes mellitus without complication, without long-term current use of insulin ([place])   3. PAD (peripheral artery disease) ([place])   4. Essential hypertension   5. Hyperlipidemia LDL goal <70   6. Lung nodule   7. Thyroid nodule   8. Arthralgia of hip, unspecified laterality      PLAN:  In order of problems listed above:  1. CAD s/p CABG: Patient denies any recent exertional chest pain.   2. HTN:  Blood pressure elevated in the office today.  However his blood pressure was normal at home prior to today's visit.  3. HLD: On Crestor 10 mg daily.  4. DM II: Managed by primary care provider  5. Thyroid nodule: Due for repeat thyroid Doppler in September 2020  6. Lung nodule: Stable on last CT of the chest.  7. Hip pain: Upcoming hip surgery.  He will need to hold aspirin and Plavix for 7 days prior to the surgery and restart them as soon as possible afterward.  Patient has already been cleared from cardiology perspective to proceed with surgery.  8. PAD: Patient has known occluded left vertebral artery.  He also has lower extremity arterial disease.  ABI is stable.   Medication Adjustments/Labs and Tests Ordered: Current medicines are reviewed at length with the patient today.  Concerns regarding medicines are outlined above.  Medication changes, Labs and Tests ordered today are listed in the Patient Instructions below. Patient Instructions  Medication Instructions:   Your physician recommends that you continue on your current medications as directed. Please refer to the Current Medication list given to you today.  If you need a refill on your cardiac medications before your next appointment, please call your pharmacy.   Lab work:  NONE ordered at this time of appointment   If you have labs (blood work) drawn today and your tests are completely normal, you will receive your results only by: Marland Kitchen MyChart Message (if you have MyChart) OR . A paper copy in the mail If you have any lab test that is abnormal or we need to change your treatment, we will call you to review the results.  Testing/Procedures:  NONE ordered at this time of appointment   Follow-Up: At [facility], you and your health needs are our priority.  As part of our continuing mission to provide you with exceptional heart care, we have created designated Provider Care Teams.  These Care Teams include your primary  Cardiologist (physician) and Advanced Practice Providers (APPs -  Physician Assistants and Nurse Practitioners) who all work together to provide you with the care you need, when you need it. You will need a follow up appointment in 6 months.  Please call our office 2 months in advance to schedule this appointment.  You may see Shelva Majestic, MD or one of the following Advanced Practice Providers on your designated Care Team: [place], [place] . Fabian Sharp, PA-C  Any Other Special Instructions Will Be Listed Below (If Applicable).       Hilbert Corrigan, [place]  01/17/2019 12:11 AM    [address], [place], [place]  [address] Phone: [phone]; Fax: [phone]

## 2019-01-15 NOTE — Patient Instructions (Signed)
Medication Instructions:   Your physician recommends that you continue on your current medications as directed. Please refer to the Current Medication list given to you today.  If you need a refill on your cardiac medications before your next appointment, please call your pharmacy.   Lab work:  NONE ordered at this time of appointment   If you have labs (blood work) drawn today and your tests are completely normal, you will receive your results only by: Marland Kitchen MyChart Message (if you have MyChart) OR . A paper copy in the mail If you have any lab test that is abnormal or we need to change your treatment, we will call you to review the results.  Testing/Procedures:  NONE ordered at this time of appointment   Follow-Up: At [facility], you and your health needs are our priority.  As part of our continuing mission to provide you with exceptional heart care, we have created designated Provider Care Teams.  These Care Teams include your primary Cardiologist (physician) and Advanced Practice Providers (APPs -  Physician Assistants and Nurse Practitioners) who all work together to provide you with the care you need, when you need it. You will need a follow up appointment in 6 months.  Please call our office 2 months in advance to schedule this appointment.  You may see Shelva Majestic, MD or one of the following Advanced Practice Providers on your designated Care Team: [place], [place] . Fabian Sharp, PA-C  Any Other Special Instructions Will Be Listed Below (If Applicable).

## 2019-01-16 ENCOUNTER — Encounter: Payer: Self-pay | Admitting: Physician Assistant

## 2019-01-18 ENCOUNTER — Encounter (HOSPITAL_COMMUNITY): Payer: Self-pay

## 2019-01-18 NOTE — Patient Instructions (Addendum)
YOU NEED TO HAVE A COVID 19 TEST ON Friday 7/10_______ @__3 :30_____, THIS TEST MUST BE DONE BEFORE SURGERY, COME TO [place] ENTRANCE. ONCE YOUR COVID TEST IS COMPLETED, PLEASE BEGIN THE QUARANTINE INSTRUCTIONS AS OUTLINED IN YOUR HANDOUT.                Gregory Eaton    Your procedure is scheduled on: 01-30-2019   Report to Gregory Eaton  Entrance  Report to admitting at 5:30 AM      Call this number if you have problems the morning of surgery [phone]     Remember:  Gregory Eaton, NO Gregory Eaton.  NO SOLID FOOD AFTER MIDNIGHT THE NIGHT PRIOR TO SURGERY.  NOTHING BY MOUTH EXCEPT CLEAR LIQUIDS UNTIL  4:30 am.   PLEASE FINISH  G2  DRINK PER SURGEON ORDER 3 HOURS PRIOR TO SCHEDULED SURGERY TIME WHICH NEEDS TO BE COMPLETED AT 4:30 AM.    CLEAR LIQUID DIET   Foods Allowed                                                                     Foods Excluded  Coffee and tea, regular and decaf                             liquids that you cannot  Plain Jell-O in any flavor                                             see through such as: Fruit ices (not with fruit pulp)                                     milk, soups, orange juice  Iced Popsicles                                    All solid food Carbonated beverages, regular and diet                                    Cranberry, grape and apple juices Sports drinks like Gatorade Lightly seasoned clear broth or consume(fat free) Sugar, honey syrup  Sample Menu Breakfast                                Lunch                                     Supper Cranberry juice                    Beef broth  Chicken broth Jell-O                                     Grape juice                           Apple juice Coffee or tea                        Jell-O                                      Popsicle                                                 Coffee or tea                        Coffee or tea  _____________________________________________________________________      Take these medicines the morning of surgery with A SIP OF WATER: Toprol XL, Norvasc,                                                       Bring albuterol to hospital with you                               You may not have any metal on your body including piercings             Do not wear jewelry, make-up, lotions, powders or perfumes, deodorant                      Do not bring valuables to the hospital. Gregory Eaton.  Contacts, dentures or bridgework may not be worn into surgery.       _____________________________________________________________________             [facility] - Preparing for Surgery  Before surgery, you can play an important role.   Because skin is not sterile, your skin needs to be as free of germs as possible.   You can reduce the number of germs on your skin by washing with CHG (chlorahexidine gluconate) soap before surgery.   CHG is an antiseptic cleaner which kills germs and bonds with the skin to continue killing germs even after washing. Please DO NOT use if you have an allergy to CHG or antibacterial soaps.   If your skin becomes reddened/irritated stop using the CHG and inform your nurse when you arrive at Short Stay. r.  You may shave your face/neck . Please follow these instructions carefully:   1.  Shower with CHG Soap the night before surgery and the  morning of Surgery.  2.  If you choose to wash your hair, wash your hair first as usual with your  normal  shampoo.  3.  After you shampoo, rinse your hair and body thoroughly to remove the  shampoo.                                        4.  Use CHG as you would any other liquid soap.  You can apply chg directly  to the skin and wash                       Gently with a scrungie or clean  washcloth.  5.  Apply the CHG Soap to your body ONLY FROM THE NECK DOWN.   Do not use on face/ open                           Wound or open sores. Avoid contact with eyes, ears mouth and genitals (private parts).                       Wash face,  Genitals (private parts) with your normal soap.             6.  Wash thoroughly, paying special attention to the area where your surgery  will be performed.  7.  Thoroughly rinse your body with warm water from the neck down.  8.  DO NOT shower/wash with your normal soap after using and rinsing off  the CHG Soap.             9.  Pat yourself dry with a clean towel.            10.  Wear clean pajamas.            11.  Place clean sheets on your bed the night of your first shower and do not  sleep with pets.  Day of Surgery : Do not apply any lotions/deodorants the morning of surgery.  Please wear clean clothes to the hospital/surgery center.   FAILURE TO FOLLOW THESE INSTRUCTIONS MAY RESULT IN THE CANCELLATION OF YOUR SURGERY PATIENT SIGNATURE_________________________________  NURSE SIGNATURE__________________________________  ________________________________________________________________________   Gregory Eaton  An incentive spirometer is a tool that can help keep your lungs clear and active. This tool measures how well you are filling your lungs with each breath. Taking long deep breaths may help reverse or decrease the chance of developing breathing (pulmonary) problems (especially infection) following:  A long period of time when you are unable to move or be active. BEFORE THE PROCEDURE   If the spirometer includes an indicator to show your best effort, your nurse or respiratory therapist will set it to a desired goal.  If possible, sit up straight or lean slightly forward. Try not to slouch.  Hold the incentive spirometer in an upright position. INSTRUCTIONS FOR USE  1. Sit on the edge of your bed if possible, or sit up as far  as you can in bed or on a chair. 2. Hold the incentive spirometer in an upright position. 3. Breathe out normally. 4. Place the mouthpiece in your mouth and seal your lips tightly around it. 5. Breathe in slowly and as deeply as possible, raising the piston or the ball toward the top of the column. 6. Hold your breath for 3-5 seconds or for as long as possible. Allow the piston or ball to fall to the bottom of the column.  7. Remove the mouthpiece from your mouth and breathe out normally. 8. Rest for a few seconds and repeat Steps 1 through 7 at least 10 times every 1-2 hours when you are awake. Take your time and take a few normal breaths between deep breaths. 9. The spirometer may include an indicator to show your best effort. Use the indicator as a goal to work toward during each repetition. 10. After each set of 10 deep breaths, practice coughing to be sure your lungs are clear. If you have an incision (the cut made at the time of surgery), support your incision when coughing by placing a pillow or rolled up towels firmly against it. Once you are able to get out of bed, walk around indoors and cough well. You may stop using the incentive spirometer when instructed by your caregiver.  RISKS AND COMPLICATIONS  Take your time so you do not get dizzy or light-headed.  If you are in pain, you may need to take or ask for pain medication before doing incentive spirometry. It is harder to take a deep breath if you are having pain. AFTER USE  Rest and breathe slowly and easily.  It can be helpful to keep track of a log of your progress. Your caregiver can provide you with a simple table to help with this. If you are using the spirometer at home, follow these instructions: [place] IF:   You are having difficultly using the spirometer.  You have trouble using the spirometer as often as instructed.  Your pain medication is not giving enough relief while using the spirometer.  You  develop fever of 100.5 F (38.1 C) or higher. SEEK IMMEDIATE MEDICAL CARE IF:   You cough up bloody sputum that had not been present before.  You develop fever of 102 F (38.9 C) or greater.  You develop worsening pain at or near the incision site. MAKE SURE YOU:   Understand these instructions.  Will watch your condition.  Will get help right away if you are not doing well or get worse. Document Released: 11/15/2006 Document Revised: 09/27/2011 Document Reviewed: 01/16/2007 [facility] Patient Information 2014 [place], [place].   ________________________________________________________________________

## 2019-01-22 ENCOUNTER — Encounter (HOSPITAL_COMMUNITY)
Admission: RE | Admit: 2019-01-22 | Discharge: 2019-01-22 | Disposition: A | Discharge status: Home / Self Care | Payer: Medicare Other | Source: Ambulatory Visit | Attending: Orthopedic Surgery | Admitting: Orthopedic Surgery

## 2019-01-22 ENCOUNTER — Other Ambulatory Visit: Payer: Self-pay

## 2019-01-22 ENCOUNTER — Encounter (HOSPITAL_COMMUNITY): Payer: Self-pay

## 2019-01-22 DIAGNOSIS — I2510 Atherosclerotic heart disease of native coronary artery without angina pectoris: Secondary | ICD-10-CM | POA: Diagnosis not present

## 2019-01-22 DIAGNOSIS — E785 Hyperlipidemia, unspecified: Secondary | ICD-10-CM | POA: Insufficient documentation

## 2019-01-22 DIAGNOSIS — M1612 Unilateral primary osteoarthritis, left hip: Secondary | ICD-10-CM | POA: Diagnosis not present

## 2019-01-22 DIAGNOSIS — J449 Chronic obstructive pulmonary disease, unspecified: Secondary | ICD-10-CM | POA: Diagnosis not present

## 2019-01-22 DIAGNOSIS — Z87891 Personal history of nicotine dependence: Secondary | ICD-10-CM | POA: Insufficient documentation

## 2019-01-22 DIAGNOSIS — K219 Gastro-esophageal reflux disease without esophagitis: Secondary | ICD-10-CM | POA: Diagnosis not present

## 2019-01-22 DIAGNOSIS — Z7902 Long term (current) use of antithrombotics/antiplatelets: Secondary | ICD-10-CM | POA: Insufficient documentation

## 2019-01-22 DIAGNOSIS — Z01818 Encounter for other preprocedural examination: Secondary | ICD-10-CM | POA: Insufficient documentation

## 2019-01-22 DIAGNOSIS — Z7982 Long term (current) use of aspirin: Secondary | ICD-10-CM | POA: Diagnosis not present

## 2019-01-22 DIAGNOSIS — Z951 Presence of aortocoronary bypass graft: Secondary | ICD-10-CM | POA: Diagnosis not present

## 2019-01-22 DIAGNOSIS — I10 Essential (primary) hypertension: Secondary | ICD-10-CM | POA: Diagnosis not present

## 2019-01-22 DIAGNOSIS — Z8501 Personal history of malignant neoplasm of esophagus: Secondary | ICD-10-CM | POA: Insufficient documentation

## 2019-01-22 DIAGNOSIS — Z79899 Other long term (current) drug therapy: Secondary | ICD-10-CM | POA: Insufficient documentation

## 2019-01-22 DIAGNOSIS — H409 Unspecified glaucoma: Secondary | ICD-10-CM | POA: Insufficient documentation

## 2019-01-22 HISTORY — DX: Other complications of anesthesia, initial encounter: T88.59XA

## 2019-01-22 LAB — BASIC METABOLIC PANEL
Anion gap: 7 (ref 5–15)
BUN: 16 mg/dL (ref 8–23)
CO2: 26 mmol/L (ref 22–32)
Calcium: 9.5 mg/dL (ref 8.9–10.3)
Chloride: 110 mmol/L (ref 98–111)
Creatinine, Ser: 0.83 mg/dL (ref 0.61–1.24)
GFR calc Af Amer: >60 mL/min (ref >60)
GFR calc non Af Amer: >60 mL/min (ref >60)
Glucose, Bld: 113 mg/dL — ABNORMAL HIGH (ref 70–99)
Potassium: 4.8 mmol/L (ref 3.5–5.1)
Sodium: 143 mmol/L (ref 135–145)

## 2019-01-22 LAB — HEMOGLOBIN A1C
Hgb A1c MFr Bld: 6.1 % — ABNORMAL HIGH (ref 4.8–5.6)
Mean Plasma Glucose: 128.37 mg/dL

## 2019-01-22 LAB — CBC
HCT: 46.7 % (ref 39.0–52.0)
Hemoglobin: 14.9 g/dL (ref 13.0–17.0)
MCH: 30 pg (ref 26.0–34.0)
MCHC: 31.9 g/dL (ref 30.0–36.0)
MCV: 94 fL (ref 80.0–100.0)
Platelets: 312 10*3/uL (ref 150–400)
RBC: 4.97 MIL/uL (ref 4.22–5.81)
RDW: 13.5 % (ref 11.5–15.5)
WBC: 7.9 10*3/uL (ref 4.0–10.5)
nRBC: 0 % (ref 0.0–0.2)

## 2019-01-22 LAB — SURGICAL PCR SCREEN
MRSA, PCR: NEGATIVE
Staphylococcus aureus: NEGATIVE

## 2019-01-23 NOTE — Anesthesia Preprocedure Evaluation (Addendum)
Anesthesia Evaluation  Patient identified by MRN, date of birth, ID band Patient awake    Reviewed: Allergy & Precautions, NPO status , Patient's Chart, lab work & pertinent test results, reviewed documented beta blocker date and time   History of Anesthesia Complications Negative for: history of anesthetic complications  Airway Mallampati: III  TM Distance: >3 FB Neck ROM: Full    Dental  (+) Dental Advisory Given, Missing, Caps   Pulmonary COPD (used inhaler yesterday),  COPD inhaler, former smoker (quit 1997),  01/26/2019 SARS coronavirus NEG H/o pulmonary nodule/mass   breath sounds clear to auscultation       Cardiovascular hypertension, Pt. on medications and Pt. on home beta blockers (-) angina+ CAD, + CABG and + Peripheral Vascular Disease (s/p fem-pop)   Rhythm:Regular Rate:Normal  '15 myoview: normal perfusion without scar or ischemia, '13 lexiscan:  normal perfusion; low risk scan; non-diagnostic for ischemia   Neuro/Psych glaucoma negative neurological ROS     GI/Hepatic Neg liver ROS, Esophageal cancer: surgical treatment   Endo/Other  negative endocrine ROS  Renal/GU negative Renal ROS     Musculoskeletal  (+) Arthritis ,   Abdominal   Peds  Hematology Off of plavix x7 days   Anesthesia Other Findings   Reproductive/Obstetrics                          Anesthesia Physical Anesthesia Plan  ASA: III  Anesthesia Plan: Spinal   Post-op Pain Management:    Induction:   PONV Risk Score and Plan: 1 and Ondansetron and Dexamethasone  Airway Management Planned: Natural Airway and Simple Face Mask  Additional Equipment:   Intra-op Plan:   Post-operative Plan:   Informed Consent: I have reviewed the patients History and Physical, chart, labs and discussed the procedure including the risks, benefits and alternatives for the proposed anesthesia with the patient or authorized  representative who has indicated his/her understanding and acceptance.     Dental advisory given  Plan Discussed with: CRNA and Surgeon  Anesthesia Plan Comments: (See PAT noet 01/22/2019 by Konrad Felix, PA-C)      Anesthesia Quick Evaluation

## 2019-01-23 NOTE — Progress Notes (Signed)
Anesthesia Chart Review   Case: [id number] Date/Time: 01/30/19 0715   Procedure: TOTAL HIP ARTHROPLASTY (Left )   Anesthesia type: Choice   Pre-op diagnosis: OA LEFT HIP   Location: [facility] ROOM 08 / [facility]   Surgeon: Renette Butters, MD      DISCUSSION:73 y.o. former smoker (80 pack years, quit 07/20/95) with h/o esophageal carcinoma (2010), CAD (CABG 1998), HLD, HTN, GERD, PVD s/p fem-pop bypass, tachyarrhythmia s/p ablation, COPD, left hip OA scheduled for above procedure 01/30/2019 with Dr. Edmonia Lynch.   Pt last seen by cardiology 01/15/2019.  Per [name], PA-C, "He will need to hold aspirin and Plavix for 7 days prior to the surgery and restart them as soon as possible afterward.  Patient has already been cleared from cardiology perspective to proceed with surgery." 6 month follow up recommended.    Last seen by pulmonlogist, Dr. Christinia Gully, 12/25/2018.  Stable at this visit.  Per OV note, "cleared for hip surgery and pulmonary f/u can be prn."  Anticipate pt can proceed with planned procedure barring acute status change.   VS: BP (!) 156/62 (BP Location: Right Arm)   Pulse 82   Temp 37.1 C (Oral)   Resp 18   Ht 5\' 8"  (1.727 m)   Wt 72.2 kg   SpO2 98%   BMI 24.20 kg/m   PROVIDERS: Celene Squibb, MD is PCP   Shelva Majestic, MD is Cardiologist   Christinia Gully, MD is Pulmonologist  LABS: Labs reviewed: Acceptable for surgery. (all labs ordered are listed, but only abnormal results are displayed)  Labs Reviewed  BASIC METABOLIC PANEL - Abnormal; Notable for the following components:      Result Value   Glucose, Bld 113 (*)    All other components within normal limits  HEMOGLOBIN A1C - Abnormal; Notable for the following components:   Hgb A1c MFr Bld 6.1 (*)    All other components within normal limits  SURGICAL PCR SCREEN  CBC     IMAGES: Chest Xray 12/19/2018 FINDINGS: Prior CABG. There is hyperinflation of the lungs compatible with COPD. Heart and mediastinal  contours are within normal limits. No focal opacities or effusions. No acute bony abnormality.  IMPRESSION: COPD/chronic changes.  No active disease.  EKG: 01/15/2019 Rate 66 bpm NSR RBBB  CV: Myocardial Perfusion 07/24/2013 Overall Impression:  Normal stress nuclear study.  Echo 08/27/2009 EF 52% Normal left ventricular systolic function  Mild diastolic dysfunction Postoperative paradoxical septal motion  Mildly dilated left atrium All other chambers are normal in size and function  No significant valvular abnormalities There is no pericardial effusion  No changes are seen.  Past Medical History:  Diagnosis Date  . Adenomatous colon polyp 03/2009   Last colonoscopy by Dr. [name]   . Adenomatous polyp 2010  . Adenomatous polyp of colon 11/03/2010  . Arthritis   . Barrett's esophagus   . CAD (coronary artery disease)   . Complication of anesthesia    has a shortened esophogus due to CA.   Marland Kitchen COPD (chronic obstructive pulmonary disease) (HCC)    Severe emphysema per CT  . Diverticulosis   . Esophageal carcinoma ([place]) 03/2009   T1N1M0  . GERD (gastroesophageal reflux disease)   . Glaucoma   . History of Doppler ultrasound 11/09/2011   03/2014- 50-69% L ICA stenosis;carotid doppler; L bulb/prox ICA 0-49% diameter reduction; L vertebral artery - occlusive ds; L ECA  demonstrates severe amount of fibrous plaque  . History of Doppler  ultrasound 11/09/11   LEAs; R ABI - mod art. insuff.; L ABI normal at rest; R SFA - occlusive ds; L SFA - occlusive ds; patent fem-pop graft  . History of echocardiogram 08/27/2009   EF >55%  . History of kidney stones 1965  . History of nuclear stress test 11/24/2011   lexiscan; normal perfusion; low risk scan; non-diagnostic for ischemia  . Hyperlipidemia   . Hypertension   . Left carotid artery stenosis 04/08/2014  . Pulmonary nodule, right 04/08/2014   2.8 mm-incidental finding on CT  . PVD (peripheral vascular disease) ([facility])   . Tachyarrhythmia 1999    Status post [facility] at [facility]  . Tobacco abuse     Past Surgical History:  Procedure Laterality Date  . BIOPSY  10/24/2017   Procedure: BIOPSY;  Surgeon: [name], MD;  Location[facility];  Service: Endoscopy;;  esophagus  . COLONOSCOPY  03/17/2009   Dr.Rourk- normal rectum, sigmoid diverticula, some pale sigmoid mucosa with diffuse petechiae. pedunculated polyp at the splenic flexure, remainder of colonic mucosa appeared normal. bx= adenomatous polyp  . COLONOSCOPY  04/19/2003   Dr.Rehman- few diverticu;a at the sigmoid colon, 3 small polyps, one at the transverse colon and 2 at the sigmoid, small external hemorrhoids. bx report not available.   . COLONOSCOPY WITH PROPOFOL N/A 10/24/2017   Dr. [name]: Diverticulosis, 11 mm polyp at the ileocecal valve, tubular adenoma.  Repeat colonoscopy in 3 years  . CORONARY ARTERY BYPASS GRAFT  1998   [name]  . ESOPHAGECTOMY  2010   [facility] Dr. [name]  . ESOPHAGOGASTRODUODENOSCOPY  11/25/2010   Dr.Rourk- s/p esophagectomy with gastric pull-up, esophageal erosions straddling the surgical anastomosis, salmon colored epithelium coming up a good centimeter to a centimeter and a half above the suture line, islands of salmon colored epithelium in the most poximal residual esophagus, remainder of gastric mucosa appeared normal. bx= swamous &gastric glandular mucosa w/chronic active inflammation  . ESOPHAGOGASTRODUODENOSCOPY  03/17/2009   Dr.Rourk- 4cm segment of salmon-colored epithlium distal esophagus suspicious for barretts esophagus. area of suspicious nodularity w/in this segment bx seperately. small to moderate size hiatal hernia, o/w normal stomach D1 and D2 bx=adenocarcinoma  . ESOPHAGOGASTRODUODENOSCOPY (EGD) WITH PROPOFOL N/A 10/24/2017   Dr. [name]: Remnant of esophagus with anastomosis with stomach at 24 cm from the incisors, 2 cm above the anastomosis he was found to have Barrett's but no dysplasia.  Advised for repeat EGD in 3 years  .  FEMORAL-POPLITEAL BYPASS GRAFT  1993   occlusive ds in R SFA  . GASTRIC PULL THROUGH  2010   With esophagectomy  . LIPOMA EXCISION  2010  . POLYPECTOMY  10/24/2017   Procedure: POLYPECTOMY;  Surgeon: [name], MD;  [facility];  Service: Endoscopy;;  colon     MEDICATIONS: . acetaminophen (TYLENOL) 500 MG tablet  . albuterol (PROVENTIL) (2.5 MG/3ML) 0.083% nebulizer solution  . ALPRAZolam (XANAX) 0.5 MG tablet  . amLODipine (NORVASC) 10 MG tablet  . aspirin 81 MG EC tablet  . brimonidine (ALPHAGAN) 0.2 % ophthalmic solution  . clopidogrel (PLAVIX) 75 MG tablet  . dexlansoprazole (DEXILANT) 60 MG capsule  . diclofenac sodium (VOLTAREN) 1 % GEL  . Glycopyrrolate-Formoterol (BEVESPI AEROSPHERE) 9-4.8 MCG/ACT AERO  . guaiFENesin (MUCINEX) 600 MG 12 hr tablet  . latanoprost (XALATAN) 0.005 % ophthalmic solution  . losartan (COZAAR) 100 MG tablet  . metoprolol succinate (TOPROL-XL) 50 MG 24 hr tablet  . montelukast (SINGULAIR) 10 MG tablet  .  Multiple Vitamins-Minerals (CENTRUM SILVER PO)  . nitroGLYCERIN (NITROSTAT) 0.4 MG SL tablet  . rosuvastatin (CRESTOR) 10 MG tablet   No current facility-administered medications for this encounter.     Maia Plan [facility] Pre-Surgical Testing [phone] 01/23/19  2:08 PM

## 2019-01-26 ENCOUNTER — Other Ambulatory Visit (HOSPITAL_COMMUNITY)
Admission: RE | Admit: 2019-01-26 | Discharge: 2019-01-26 | Disposition: A | Discharge status: Home / Self Care | Payer: Medicare Other | Source: Ambulatory Visit | Attending: Orthopedic Surgery | Admitting: Orthopedic Surgery

## 2019-01-26 DIAGNOSIS — Z01812 Encounter for preprocedural laboratory examination: Secondary | ICD-10-CM | POA: Insufficient documentation

## 2019-01-26 DIAGNOSIS — Z1159 Encounter for screening for other viral diseases: Secondary | ICD-10-CM | POA: Insufficient documentation

## 2019-01-27 LAB — SARS CORONAVIRUS 2 (TAT 6-24 HRS): SARS Coronavirus 2: NEGATIVE

## 2019-01-29 MED ORDER — BUPIVACAINE LIPOSOME 1.3 % IJ SUSP
10.0000 mL | Freq: Once | INTRAMUSCULAR | Status: DC
  Filled 2019-01-29: qty 10

## 2019-01-30 ENCOUNTER — Inpatient Hospital Stay (HOSPITAL_COMMUNITY): Payer: Medicare Other

## 2019-01-30 ENCOUNTER — Inpatient Hospital Stay (HOSPITAL_COMMUNITY): Payer: Medicare Other | Admitting: Anesthesiology

## 2019-01-30 ENCOUNTER — Inpatient Hospital Stay (HOSPITAL_COMMUNITY)
Admission: RE | Admit: 2019-01-30 | Discharge: 2019-01-31 | DRG: 470 | Disposition: A | Discharge status: Home / Self Care | Payer: Medicare Other | Attending: Orthopedic Surgery | Admitting: Orthopedic Surgery

## 2019-01-30 ENCOUNTER — Inpatient Hospital Stay (HOSPITAL_COMMUNITY): Payer: Medicare Other | Admitting: Physician Assistant

## 2019-01-30 ENCOUNTER — Other Ambulatory Visit: Payer: Self-pay

## 2019-01-30 ENCOUNTER — Encounter (HOSPITAL_COMMUNITY): Payer: Self-pay | Admitting: Anesthesiology

## 2019-01-30 ENCOUNTER — Encounter (HOSPITAL_COMMUNITY)
Admission: RE | Disposition: A | Discharge status: Home / Self Care | Payer: Self-pay | Source: Home / Self Care | Attending: Orthopedic Surgery

## 2019-01-30 DIAGNOSIS — E785 Hyperlipidemia, unspecified: Secondary | ICD-10-CM

## 2019-01-30 DIAGNOSIS — I2510 Atherosclerotic heart disease of native coronary artery without angina pectoris: Secondary | ICD-10-CM | POA: Diagnosis present

## 2019-01-30 DIAGNOSIS — Z7982 Long term (current) use of aspirin: Secondary | ICD-10-CM | POA: Diagnosis not present

## 2019-01-30 DIAGNOSIS — E1151 Type 2 diabetes mellitus with diabetic peripheral angiopathy without gangrene: Secondary | ICD-10-CM | POA: Diagnosis not present

## 2019-01-30 DIAGNOSIS — J449 Chronic obstructive pulmonary disease, unspecified: Secondary | ICD-10-CM | POA: Diagnosis present

## 2019-01-30 DIAGNOSIS — Z419 Encounter for procedure for purposes other than remedying health state, unspecified: Secondary | ICD-10-CM

## 2019-01-30 DIAGNOSIS — K219 Gastro-esophageal reflux disease without esophagitis: Secondary | ICD-10-CM | POA: Diagnosis not present

## 2019-01-30 DIAGNOSIS — Z7902 Long term (current) use of antithrombotics/antiplatelets: Secondary | ICD-10-CM | POA: Diagnosis not present

## 2019-01-30 DIAGNOSIS — Z951 Presence of aortocoronary bypass graft: Secondary | ICD-10-CM

## 2019-01-30 DIAGNOSIS — M1612 Unilateral primary osteoarthritis, left hip: Principal | ICD-10-CM | POA: Diagnosis present

## 2019-01-30 DIAGNOSIS — Z8501 Personal history of malignant neoplasm of esophagus: Secondary | ICD-10-CM | POA: Diagnosis not present

## 2019-01-30 DIAGNOSIS — I10 Essential (primary) hypertension: Secondary | ICD-10-CM | POA: Diagnosis not present

## 2019-01-30 DIAGNOSIS — C155 Malignant neoplasm of lower third of esophagus: Secondary | ICD-10-CM | POA: Diagnosis present

## 2019-01-30 DIAGNOSIS — Z96642 Presence of left artificial hip joint: Secondary | ICD-10-CM | POA: Diagnosis not present

## 2019-01-30 DIAGNOSIS — Z87891 Personal history of nicotine dependence: Secondary | ICD-10-CM

## 2019-01-30 DIAGNOSIS — I6522 Occlusion and stenosis of left carotid artery: Secondary | ICD-10-CM | POA: Diagnosis present

## 2019-01-30 DIAGNOSIS — J439 Emphysema, unspecified: Secondary | ICD-10-CM | POA: Diagnosis not present

## 2019-01-30 DIAGNOSIS — Z471 Aftercare following joint replacement surgery: Secondary | ICD-10-CM | POA: Diagnosis not present

## 2019-01-30 DIAGNOSIS — E1101 Type 2 diabetes mellitus with hyperosmolarity with coma: Secondary | ICD-10-CM

## 2019-01-30 DIAGNOSIS — Z96649 Presence of unspecified artificial hip joint: Secondary | ICD-10-CM

## 2019-01-30 DIAGNOSIS — Z8249 Family history of ischemic heart disease and other diseases of the circulatory system: Secondary | ICD-10-CM | POA: Diagnosis not present

## 2019-01-30 HISTORY — PX: TOTAL HIP ARTHROPLASTY: SHX124

## 2019-01-30 SURGERY — ARTHROPLASTY, HIP, TOTAL, ANTERIOR APPROACH
Anesthesia: Spinal | Laterality: Left

## 2019-01-30 MED ORDER — CHLORHEXIDINE GLUCONATE 4 % EX LIQD: 60.0000 mL | Freq: Once | CUTANEOUS | Status: DC

## 2019-01-30 MED ORDER — BUPIVACAINE IN DEXTROSE 0.75-8.25 % IT SOLN
INTRATHECAL | Status: DC | PRN
  Administered 2019-01-30: 13.5 mg via INTRATHECAL

## 2019-01-30 MED ORDER — BRIMONIDINE TARTRATE 0.2 % OP SOLN
1.0000 [drp] | Freq: Every day | OPHTHALMIC | Status: DC
  Administered 2019-01-30: 1 [drp] via OPHTHALMIC
  Filled 2019-01-30: qty 5

## 2019-01-30 MED ORDER — POLYETHYLENE GLYCOL 3350 17 G PO PACK: 17.0000 g | PACK | Freq: Every day | ORAL | Status: DC | PRN

## 2019-01-30 MED ORDER — BACLOFEN 10 MG PO TABS: 10.0000 mg | ORAL_TABLET | Freq: Three times a day (TID) | ORAL | 0 refills | Status: DC | PRN

## 2019-01-30 MED ORDER — MIDAZOLAM HCL 5 MG/5ML IJ SOLN
INTRAMUSCULAR | Status: DC | PRN
  Administered 2019-01-30: 0.5 mg via INTRAVENOUS
  Administered 2019-01-30: 1.5 mg via INTRAVENOUS

## 2019-01-30 MED ORDER — MIDAZOLAM HCL 2 MG/2ML IJ SOLN
INTRAMUSCULAR | Status: AC
  Filled 2019-01-30: qty 2

## 2019-01-30 MED ORDER — METHOCARBAMOL 500 MG IVPB - SIMPLE MED
500.0000 mg | Freq: Four times a day (QID) | INTRAVENOUS | Status: DC | PRN
  Filled 2019-01-30: qty 50

## 2019-01-30 MED ORDER — ONDANSETRON HCL 4 MG PO TABS: 4.0000 mg | ORAL_TABLET | Freq: Four times a day (QID) | ORAL | Status: DC | PRN

## 2019-01-30 MED ORDER — ACETAMINOPHEN 325 MG PO TABS: 325.0000 mg | ORAL_TABLET | Freq: Four times a day (QID) | ORAL | Status: DC | PRN

## 2019-01-30 MED ORDER — METOCLOPRAMIDE HCL 5 MG PO TABS: 5.0000 mg | ORAL_TABLET | Freq: Three times a day (TID) | ORAL | Status: DC | PRN

## 2019-01-30 MED ORDER — GABAPENTIN 100 MG PO CAPS: 100.0000 mg | ORAL_CAPSULE | Freq: Two times a day (BID) | ORAL | 0 refills | Status: DC | PRN

## 2019-01-30 MED ORDER — HYDROCODONE-ACETAMINOPHEN 5-325 MG PO TABS
1.0000 | ORAL_TABLET | ORAL | Status: DC | PRN
  Administered 2019-01-30 (×3): 1 via ORAL
  Administered 2019-01-31: 2 via ORAL
  Filled 2019-01-30 (×2): qty 1
  Filled 2019-01-30: qty 2
  Filled 2019-01-30: qty 1

## 2019-01-30 MED ORDER — ONDANSETRON HCL 4 MG/2ML IJ SOLN: 4.0000 mg | Freq: Four times a day (QID) | INTRAMUSCULAR | Status: DC | PRN

## 2019-01-30 MED ORDER — CEFAZOLIN SODIUM-DEXTROSE 1-4 GM/50ML-% IV SOLN
1.0000 g | Freq: Four times a day (QID) | INTRAVENOUS | Status: AC
  Administered 2019-01-30 (×2): 1 g via INTRAVENOUS
  Filled 2019-01-30 (×2): qty 50

## 2019-01-30 MED ORDER — ROSUVASTATIN CALCIUM 10 MG PO TABS
10.0000 mg | ORAL_TABLET | Freq: Every day | ORAL | Status: DC
  Administered 2019-01-30 – 2019-01-31 (×2): 10 mg via ORAL
  Filled 2019-01-30 (×2): qty 1

## 2019-01-30 MED ORDER — ALBUTEROL SULFATE (2.5 MG/3ML) 0.083% IN NEBU: 2.5000 mg | INHALATION_SOLUTION | Freq: Four times a day (QID) | RESPIRATORY_TRACT | Status: DC | PRN

## 2019-01-30 MED ORDER — PHENYLEPHRINE HCL-NACL 10-0.9 MG/250ML-% IV SOLN
INTRAVENOUS | Status: AC
  Filled 2019-01-30: qty 250

## 2019-01-30 MED ORDER — ONDANSETRON HCL 4 MG PO TABS: 4.0000 mg | ORAL_TABLET | Freq: Three times a day (TID) | ORAL | 0 refills | Status: DC | PRN

## 2019-01-30 MED ORDER — ONDANSETRON HCL 4 MG/2ML IJ SOLN
INTRAMUSCULAR | Status: DC | PRN
  Administered 2019-01-30: 4 mg via INTRAVENOUS

## 2019-01-30 MED ORDER — SODIUM CHLORIDE (PF) 0.9 % IJ SOLN
INTRAMUSCULAR | Status: AC
  Filled 2019-01-30: qty 20

## 2019-01-30 MED ORDER — NITROGLYCERIN 0.4 MG SL SUBL: 0.4000 mg | SUBLINGUAL_TABLET | SUBLINGUAL | Status: DC | PRN

## 2019-01-30 MED ORDER — METOCLOPRAMIDE HCL 5 MG/ML IJ SOLN: 5.0000 mg | Freq: Three times a day (TID) | INTRAMUSCULAR | Status: DC | PRN

## 2019-01-30 MED ORDER — MENTHOL 3 MG MT LOZG: 1.0000 | LOZENGE | OROMUCOSAL | Status: DC | PRN

## 2019-01-30 MED ORDER — ACETAMINOPHEN 500 MG PO TABS
1000.0000 mg | ORAL_TABLET | Freq: Once | ORAL | Status: AC
  Administered 2019-01-30: 1000 mg via ORAL
  Filled 2019-01-30: qty 2

## 2019-01-30 MED ORDER — HYDROMORPHONE HCL 1 MG/ML IJ SOLN: 0.2500 mg | INTRAMUSCULAR | Status: DC | PRN

## 2019-01-30 MED ORDER — GABAPENTIN 100 MG PO CAPS
100.0000 mg | ORAL_CAPSULE | Freq: Two times a day (BID) | ORAL | Status: DC
  Administered 2019-01-30 – 2019-01-31 (×3): 100 mg via ORAL
  Filled 2019-01-30 (×4): qty 1

## 2019-01-30 MED ORDER — ACETAMINOPHEN 500 MG PO TABS
500.0000 mg | ORAL_TABLET | Freq: Four times a day (QID) | ORAL | Status: DC
  Administered 2019-01-30 – 2019-01-31 (×3): 500 mg via ORAL
  Filled 2019-01-30 (×3): qty 1

## 2019-01-30 MED ORDER — PROPOFOL 10 MG/ML IV BOLUS
INTRAVENOUS | Status: DC | PRN
  Administered 2019-01-30: 20 mg via INTRAVENOUS

## 2019-01-30 MED ORDER — FENTANYL CITRATE (PF) 100 MCG/2ML IJ SOLN
INTRAMUSCULAR | Status: DC | PRN
  Administered 2019-01-30: 50 ug via INTRAVENOUS

## 2019-01-30 MED ORDER — HYDROCODONE-ACETAMINOPHEN 5-325 MG PO TABS: 1.0000 | ORAL_TABLET | Freq: Four times a day (QID) | ORAL | 0 refills | Status: DC | PRN

## 2019-01-30 MED ORDER — LACTATED RINGERS IV SOLN
INTRAVENOUS | Status: DC
  Administered 2019-01-30 (×2): via INTRAVENOUS

## 2019-01-30 MED ORDER — CLOPIDOGREL BISULFATE 75 MG PO TABS: 75.0000 mg | ORAL_TABLET | Freq: Every day | ORAL | Status: DC

## 2019-01-30 MED ORDER — PROPOFOL 10 MG/ML IV BOLUS
INTRAVENOUS | Status: AC
  Filled 2019-01-30: qty 80

## 2019-01-30 MED ORDER — DEXLANSOPRAZOLE 60 MG PO CPDR
60.0000 mg | DELAYED_RELEASE_CAPSULE | Freq: Every day | ORAL | Status: DC
  Filled 2019-01-30 (×2): qty 1

## 2019-01-30 MED ORDER — 0.9 % SODIUM CHLORIDE (POUR BTL) OPTIME
TOPICAL | Status: DC | PRN
  Administered 2019-01-30: 1000 mL

## 2019-01-30 MED ORDER — PROPOFOL 500 MG/50ML IV EMUL
INTRAVENOUS | Status: DC | PRN
  Administered 2019-01-30: 75 ug/kg/min via INTRAVENOUS

## 2019-01-30 MED ORDER — DOCUSATE SODIUM 100 MG PO CAPS
100.0000 mg | ORAL_CAPSULE | Freq: Two times a day (BID) | ORAL | Status: DC
  Administered 2019-01-30 – 2019-01-31 (×2): 100 mg via ORAL
  Filled 2019-01-30 (×2): qty 1

## 2019-01-30 MED ORDER — POVIDONE-IODINE 10 % EX SWAB: 2.0000 "application " | Freq: Once | CUTANEOUS | Status: DC

## 2019-01-30 MED ORDER — PROMETHAZINE HCL 25 MG/ML IJ SOLN: 6.2500 mg | INTRAMUSCULAR | Status: DC | PRN

## 2019-01-30 MED ORDER — TRANEXAMIC ACID-NACL 1000-0.7 MG/100ML-% IV SOLN
1000.0000 mg | INTRAVENOUS | Status: AC
  Administered 2019-01-30: 1000 mg via INTRAVENOUS
  Filled 2019-01-30: qty 100

## 2019-01-30 MED ORDER — MONTELUKAST SODIUM 10 MG PO TABS
10.0000 mg | ORAL_TABLET | Freq: Every day | ORAL | Status: DC
  Administered 2019-01-30 – 2019-01-31 (×2): 10 mg via ORAL
  Filled 2019-01-30 (×2): qty 1

## 2019-01-30 MED ORDER — CEFAZOLIN SODIUM-DEXTROSE 2-4 GM/100ML-% IV SOLN
2.0000 g | INTRAVENOUS | Status: AC
  Administered 2019-01-30: 2 g via INTRAVENOUS
  Filled 2019-01-30: qty 100

## 2019-01-30 MED ORDER — SODIUM CHLORIDE 0.9 % IV SOLN
INTRAVENOUS | Status: DC | PRN
  Administered 2019-01-30: 20 ug/min via INTRAVENOUS

## 2019-01-30 MED ORDER — SODIUM CHLORIDE FLUSH 0.9 % IV SOLN
INTRAVENOUS | Status: DC | PRN
  Administered 2019-01-30: 20 mL

## 2019-01-30 MED ORDER — MEPERIDINE HCL 50 MG/ML IJ SOLN: 6.2500 mg | INTRAMUSCULAR | Status: DC | PRN

## 2019-01-30 MED ORDER — DEXAMETHASONE SODIUM PHOSPHATE 10 MG/ML IJ SOLN
INTRAMUSCULAR | Status: DC | PRN
  Administered 2019-01-30: 10 mg via INTRAVENOUS

## 2019-01-30 MED ORDER — LATANOPROST 0.005 % OP SOLN
1.0000 [drp] | Freq: Every day | OPHTHALMIC | Status: DC
  Administered 2019-01-30: 1 [drp] via OPHTHALMIC
  Filled 2019-01-30 (×2): qty 2.5

## 2019-01-30 MED ORDER — MAGNESIUM CITRATE PO SOLN: 1.0000 | Freq: Once | ORAL | Status: DC | PRN

## 2019-01-30 MED ORDER — DEXAMETHASONE SODIUM PHOSPHATE 10 MG/ML IJ SOLN
10.0000 mg | Freq: Once | INTRAMUSCULAR | Status: AC
  Administered 2019-01-31: 10 mg via INTRAVENOUS
  Filled 2019-01-30: qty 1

## 2019-01-30 MED ORDER — NON FORMULARY: 1.0000 | Freq: Every day | Status: DC

## 2019-01-30 MED ORDER — SORBITOL 70 % SOLN
30.0000 mL | Freq: Every day | Status: DC | PRN
  Filled 2019-01-30: qty 30

## 2019-01-30 MED ORDER — PHENOL 1.4 % MT LIQD
1.0000 | OROMUCOSAL | Status: DC | PRN
  Filled 2019-01-30: qty 177

## 2019-01-30 MED ORDER — LOSARTAN POTASSIUM 50 MG PO TABS
100.0000 mg | ORAL_TABLET | Freq: Every day | ORAL | Status: DC
  Administered 2019-01-30: 100 mg via ORAL
  Filled 2019-01-30: qty 2

## 2019-01-30 MED ORDER — AMLODIPINE BESYLATE 10 MG PO TABS
10.0000 mg | ORAL_TABLET | Freq: Every day | ORAL | Status: DC
  Administered 2019-01-31: 10 mg via ORAL
  Filled 2019-01-30: qty 1

## 2019-01-30 MED ORDER — METHOCARBAMOL 500 MG PO TABS
500.0000 mg | ORAL_TABLET | Freq: Four times a day (QID) | ORAL | Status: DC | PRN
  Administered 2019-01-30 (×2): 500 mg via ORAL
  Filled 2019-01-30 (×2): qty 1

## 2019-01-30 MED ORDER — ALPRAZOLAM 0.5 MG PO TABS
0.5000 mg | ORAL_TABLET | Freq: Every day | ORAL | Status: DC
  Administered 2019-01-30: 0.5 mg via ORAL
  Filled 2019-01-30: qty 1

## 2019-01-30 MED ORDER — DIPHENHYDRAMINE HCL 12.5 MG/5ML PO ELIX: 12.5000 mg | ORAL_SOLUTION | ORAL | Status: DC | PRN

## 2019-01-30 MED ORDER — BUPIVACAINE LIPOSOME 1.3 % IJ SUSP
INTRAMUSCULAR | Status: DC | PRN
  Administered 2019-01-30: 10 mL

## 2019-01-30 MED ORDER — ASPIRIN EC 81 MG PO TBEC: 81.0000 mg | DELAYED_RELEASE_TABLET | Freq: Two times a day (BID) | ORAL | 0 refills | Status: DC

## 2019-01-30 MED ORDER — ASPIRIN 81 MG PO CHEW
81.0000 mg | CHEWABLE_TABLET | Freq: Two times a day (BID) | ORAL | Status: DC
  Administered 2019-01-30 – 2019-01-31 (×2): 81 mg via ORAL
  Filled 2019-01-30 (×2): qty 1

## 2019-01-30 MED ORDER — FENTANYL CITRATE (PF) 100 MCG/2ML IJ SOLN
INTRAMUSCULAR | Status: AC
  Filled 2019-01-30: qty 2

## 2019-01-30 MED ORDER — MIDAZOLAM HCL 2 MG/2ML IJ SOLN: 0.5000 mg | Freq: Once | INTRAMUSCULAR | Status: DC | PRN

## 2019-01-30 MED ORDER — LACTATED RINGERS IV SOLN
INTRAVENOUS | Status: DC
  Administered 2019-01-30 (×2): via INTRAVENOUS

## 2019-01-30 SURGICAL SUPPLY — 43 items
APL PRP STRL LF DISP 70% ISPRP (MISCELLANEOUS) ×1
BLADE SAG 18X100X1.27 (BLADE) IMPLANT
BLADE SURG SZ10 CARB STEEL (BLADE) ×4 IMPLANT
CHLORAPREP W/TINT 26 (MISCELLANEOUS) ×2 IMPLANT
CLSR STERI-STRIP ANTIMIC 1/2X4 (GAUZE/BANDAGES/DRESSINGS) ×2 IMPLANT
COVER PERINEAL POST (MISCELLANEOUS) ×2 IMPLANT
COVER SURGICAL LIGHT HANDLE (MISCELLANEOUS) ×2 IMPLANT
COVER WAND RF STERILE (DRAPES) IMPLANT
DECANTER SPIKE VIAL GLASS SM (MISCELLANEOUS) ×4 IMPLANT
DRAPE IMP U-DRAPE 54X76 (DRAPES) ×2 IMPLANT
DRAPE STERI IOBAN 125X83 (DRAPES) ×2 IMPLANT
DRAPE U-SHAPE 47X51 STRL (DRAPES) ×4 IMPLANT
DRSG MEPILEX BORDER 4X8 (GAUZE/BANDAGES/DRESSINGS) ×2 IMPLANT
ELECT BLADE TIP CTD 4 INCH (ELECTRODE) ×2 IMPLANT
GLOVE BIO SURGEON STRL SZ7.5 (GLOVE) ×4 IMPLANT
GLOVE BIOGEL PI IND STRL 8 (GLOVE) ×2 IMPLANT
GLOVE BIOGEL PI INDICATOR 8 (GLOVE) ×2
GOWN STRL REUS W/TWL LRG LVL3 (GOWN DISPOSABLE) ×2 IMPLANT
GOWN STRL REUS W/TWL XL LVL3 (GOWN DISPOSABLE) ×2 IMPLANT
HEAD BIOLOX HIP 36/-2.5 (Joint) IMPLANT
HIP BIOLOX HD 36/-2.5 (Joint) ×2 IMPLANT
HOLDER FOLEY CATH W/STRAP (MISCELLANEOUS) ×1 IMPLANT
INSERT TRIDENT POLY 36MM 0DEG (Insert) ×1 IMPLANT
KIT TURNOVER KIT A (KITS) IMPLANT
MANIFOLD NEPTUNE II (INSTRUMENTS) ×2 IMPLANT
NS IRRIG 1000ML POUR BTL (IV SOLUTION) ×2 IMPLANT
PACK ANTERIOR HIP CUSTOM (KITS) ×2 IMPLANT
PROTECTOR NERVE ULNAR (MISCELLANEOUS) ×2 IMPLANT
SCREW HEX LP 6.5X20 (Screw) ×1 IMPLANT
SHELL ACETABUL CLUSTER SZ 54 (Shell) ×1 IMPLANT
STEM ACCOLADE SZ 6 (Hips) ×1 IMPLANT
STRIP CLOSURE SKIN 1/2X4 (GAUZE/BANDAGES/DRESSINGS) ×1 IMPLANT
SUT MNCRL AB 4-0 PS2 18 (SUTURE) ×2 IMPLANT
SUT STRATAFIX 0 PDS 27 VIOLET (SUTURE) ×2
SUT VIC AB 0 CT1 36 (SUTURE) ×2 IMPLANT
SUT VIC AB 1 CT1 36 (SUTURE) ×2 IMPLANT
SUT VIC AB 2-0 CT1 27 (SUTURE) ×4
SUT VIC AB 2-0 CT1 TAPERPNT 27 (SUTURE) ×2 IMPLANT
SUTURE STRATFX 0 PDS 27 VIOLET (SUTURE) ×1 IMPLANT
TRAY FOLEY MTR SLVR 14FR STAT (SET/KITS/TRAYS/PACK) ×1 IMPLANT
TRAY FOLEY MTR SLVR 16FR STAT (SET/KITS/TRAYS/PACK) IMPLANT
WATER STERILE IRR 1000ML POUR (IV SOLUTION) ×4 IMPLANT
YANKAUER SUCT BULB TIP 10FT TU (MISCELLANEOUS) ×2 IMPLANT

## 2019-01-30 NOTE — Interval H&P Note (Signed)
I participated in the care of this patient and agree with the above history, physical and evaluation. I performed a review of the history and a physical exam as detailed   Timothy Daniel Murphy MD  

## 2019-01-30 NOTE — Transfer of Care (Signed)
Immediate Anesthesia Transfer of Care Note  Patient: Gregory Eaton  Procedure(s) Performed: Procedure(s): TOTAL HIP ARTHROPLASTY ANTERIOR APPROACH (Left)  Patient Location: PACU  Anesthesia Type:General  Level of Consciousness:  sedated, patient cooperative and responds to stimulation  Airway & Oxygen Therapy:Patient Spontanous Breathing and Patient connected to face mask oxgen  Post-op Assessment:  Report given to PACU RN and Post -op Vital signs reviewed and stable  Post vital signs:  Reviewed and stable  Last Vitals:  Vitals:   01/30/19 0537  BP: (!) 154/66  Pulse: 70  Resp: 15  Temp: 36.9 C  SpO2: 76%    Complications: No apparent anesthesia complications

## 2019-01-30 NOTE — Anesthesia Postprocedure Evaluation (Signed)
Anesthesia Post Note  Patient: Gregory Eaton  Procedure(s) Performed: TOTAL HIP ARTHROPLASTY ANTERIOR APPROACH (Left )     Patient location during evaluation: PACU Anesthesia Type: Spinal Level of consciousness: awake and alert, oriented and patient cooperative Pain management: pain level controlled Vital Signs Assessment: post-procedure vital signs reviewed and stable Respiratory status: spontaneous breathing, nonlabored ventilation and respiratory function stable Cardiovascular status: blood pressure returned to baseline and stable Postop Assessment: no apparent nausea or vomiting and spinal receding Anesthetic complications: no    Last Vitals:  Vitals:   01/30/19 1015 01/30/19 1030  BP: (!) 151/36 (!) 145/66  Pulse: 66 66  Resp: 10 12  Temp:  36.4 C  SpO2: 99% 100%    Last Pain:  Vitals:   01/30/19 1030  TempSrc:   PainSc: 0-No pain                 Kaeo Jacome,E. Breannah Kratt

## 2019-01-30 NOTE — Discharge Instructions (Signed)
You may bear weight as tolerated. Keep your dressing on and dry until follow up. Take medicine to prevent blood clots as directed.  Resume Plavix. Take pain medicine as needed with the goal of transitioning to over the counter medicines.  If needed, you may increase breakthrough pain medication (Norco) for the first few days post op - up to 2 tablets every 4 hours.  Stop this medication as soon as you are able.  INSTRUCTIONS AFTER JOINT REPLACEMENT   o Remove items at home which could result in a fall. This includes throw rugs or furniture in walking pathways o ICE to the affected joint every three hours while awake for 30 minutes at a time, for at least the first 3-5 days, and then as needed for pain and swelling.  Continue to use ice for pain and swelling. You may notice swelling that will progress down to the foot and ankle.  This is normal after surgery.  Elevate your leg when you are not up walking on it.   o Continue to use the breathing machine you got in the hospital (incentive spirometer) which will help keep your temperature down.  It is common for your temperature to cycle up and down following surgery, especially at night when you are not up moving around and exerting yourself.  The breathing machine keeps your lungs expanded and your temperature down.   DIET:  As you were doing prior to hospitalization, we recommend a well-balanced diet.  DRESSING / WOUND CARE / SHOWERING  You may shower 3 days after surgery, but keep the wounds dry during showering.  You may use an occlusive plastic wrap (Press'n Seal for example) with blue painter's tape at edges, NO SOAKING/SUBMERGING IN THE BATHTUB.  If the bandage gets wet, change with a clean dry gauze.  If the incision gets wet, pat the wound dry with a clean towel.  ACTIVITY  o Increase activity slowly as tolerated, but follow the weight bearing instructions below.   o No driving for 6 weeks or until further direction given by your  physician.  You cannot drive while taking narcotics.  o No lifting or carrying greater than 10 lbs. until further directed by your surgeon. o Avoid periods of inactivity such as sitting longer than an hour when not asleep. This helps prevent blood clots.  o You may return to work once you are authorized by your doctor.     WEIGHT BEARING   Weight bearing as tolerated with assist device (walker, cane, etc) as directed, use it as long as suggested by your surgeon or therapist, typically at least 4-6 weeks.   EXERCISES  Results after joint replacement surgery are often greatly improved when you follow the exercise, range of motion and muscle strengthening exercises prescribed by your doctor. Safety measures are also important to protect the joint from further injury. Any time any of these exercises cause you to have increased pain or swelling, decrease what you are doing until you are comfortable again and then slowly increase them. If you have problems or questions, call your caregiver or physical therapist for advice.   Rehabilitation is important following a joint replacement. After just a few days of immobilization, the muscles of the leg can become weakened and shrink (atrophy).  These exercises are designed to build up the tone and strength of the thigh and leg muscles and to improve motion. Often times heat used for twenty to thirty minutes before working out will loosen up your tissues  and help with improving the range of motion but do not use heat for the first two weeks following surgery (sometimes heat can increase post-operative swelling).   These exercises can be done on a training (exercise) mat, on the floor, on a table or on a bed. Use whatever works the best and is most comfortable for you.    Use music or television while you are exercising so that the exercises are a pleasant break in your day. This will make your life better with the exercises acting as a break in your routine that  you can look forward to.   Perform all exercises about fifteen times, three times per day or as directed.  You should exercise both the operative leg and the other leg as well.  Exercises include:    Quad Sets - Tighten up the muscle on the front of the thigh (Quad) and hold for 5-10 seconds.    Straight Leg Raises - With your knee straight (if you were given a brace, keep it on), lift the leg to 60 degrees, hold for 3 seconds, and slowly lower the leg.  Perform this exercise against resistance later as your leg gets stronger.   Leg Slides: Lying on your back, slowly slide your foot toward your buttocks, bending your knee up off the floor (only go as far as is comfortable). Then slowly slide your foot back down until your leg is flat on the floor again.   Angel Wings: Lying on your back spread your legs to the side as far apart as you can without causing discomfort.   Hamstring Strength:  Lying on your back, push your heel against the floor with your leg straight by tightening up the muscles of your buttocks.  Repeat, but this time bend your knee to a comfortable angle, and push your heel against the floor.  You may put a pillow under the heel to make it more comfortable if necessary.   A rehabilitation program following joint replacement surgery can speed recovery and prevent re-injury in the future due to weakened muscles. Contact your doctor or a physical therapist for more information on knee rehabilitation.    CONSTIPATION  Constipation is defined medically as fewer than three stools per week and severe constipation as less than one stool per week.  Even if you have a regular bowel pattern at home, your normal regimen is likely to be disrupted due to multiple reasons following surgery.  Combination of anesthesia, postoperative narcotics, change in appetite and fluid intake all can affect your bowels.   YOU MUST use at least one of the following options; they are listed in order of  increasing strength to get the job done.  They are all available over the counter, and you may need to use some, POSSIBLY even all of these options:    Drink plenty of fluids (prune juice may be helpful) and high fiber foods Colace 100 mg by mouth twice a day  Senokot for constipation as directed and as needed Dulcolax (bisacodyl), take with full glass of water  Miralax (polyethylene glycol) once or twice a day as needed.  If you have tried all these things and are unable to have a bowel movement in the first 3-4 days after surgery call either your surgeon or your primary doctor.    If you experience loose stools or diarrhea, hold the medications until you stool forms back up.  If your symptoms do not get better within 1 week or if  they get worse, check with your doctor.  If you experience "the worst abdominal pain ever" or develop nausea or vomiting, please contact the office immediately for further recommendations for treatment.   ITCHING:  If you experience itching with your medications, try taking only a single pain pill, or even half a pain pill at a time.  You can also use Benadryl over the counter for itching or also to help with sleep.   TED HOSE STOCKINGS:  Use stockings on both legs until for at least 2 weeks or as directed by physician office. They may be removed at night for sleeping.  MEDICATIONS:  See your medication summary on the After Visit Summary that nursing will review with you.  You may have some home medications which will be placed on hold until you complete the course of blood thinner medication.  It is important for you to complete the blood thinner medication as prescribed.  PRECAUTIONS:  If you experience chest pain or shortness of breath - call 911 immediately for transfer to the hospital emergency department.   If you develop a fever greater that 101 F, purulent drainage from wound, increased redness or drainage from wound, foul odor from the wound/dressing, or  calf pain - CONTACT YOUR SURGEON.                                                   FOLLOW-UP APPOINTMENTS:  If you do not already have a post-op appointment, please call the office for an appointment to be seen by your surgeon.  Guidelines for how soon to be seen are listed in your After Visit Summary, but are typically between 1-4 weeks after surgery.  OTHER INSTRUCTIONS:     MAKE SURE YOU:   Understand these instructions.   Get help right away if you are not doing well or get worse.    Thank you for letting us be a part of your medical care team.  It is a privilege we respect greatly.  We hope these instructions will help you stay on track for a fast and full recovery!

## 2019-01-30 NOTE — Op Note (Signed)
01/30/2019  8:41 AM  PATIENT:  Gregory Eaton   MRN: [id number]  PRE-OPERATIVE DIAGNOSIS:  OA LEFT HIP  POST-OPERATIVE DIAGNOSIS:  OA LEFT HIP  PROCEDURE:  Procedure(s): TOTAL HIP ARTHROPLASTY ANTERIOR APPROACH  PREOPERATIVE INDICATIONS:    DOWELL HOON is an 73 y.o. male who has a diagnosis of Primary osteoarthritis of left hip and elected for surgical management after failing conservative treatment.  The risks benefits and alternatives were discussed with the patient including but not limited to the risks of nonoperative treatment, versus surgical intervention including infection, bleeding, nerve injury, periprosthetic fracture, the need for revision surgery, dislocation, leg length discrepancy, blood clots, cardiopulmonary complications, morbidity, mortality, among others, and they were willing to proceed.     OPERATIVE REPORT     SURGEON:   Renette Butters, MD    ASSISTANT:  Roxan Hockey, PA-C, he was present and scrubbed throughout the case, critical for completion in a timely fashion, and for retraction, instrumentation, and closure.     ANESTHESIA:  General    COMPLICATIONS:  None.     COMPONENTS:  [employer]ker acolade fit femur size 6 with a 36 mm -2.5 head ball and a PSL acetabular shell size 54 with a  polyethylene liner    PROCEDURE IN DETAIL:   The patient was met in the holding area and  identified.  The appropriate hip was identified and marked at the operative site.  The patient was then transported to the OR  and  placed under anesthesia per that record.  At that point, the patient was  placed in the supine position and  secured to the operating room table and all bony prominences padded. He received pre-operative antibiotics    The operative lower extremity was prepped from the iliac crest to the distal leg.  Sterile draping was performed.  Time out was performed prior to incision.      Skin incision was made just 2 cm lateral to the ASIS  extending in line with  the tensor fascia lata. Electrocautery was used to control all bleeders. I dissected down sharply to the fascia of the tensor fascia lata was confirmed that the muscle fibers beneath were running posteriorly. I then incised the fascia over the superficial tensor fascia lata in line with the incision. The fascia was elevated off the anterior aspect of the muscle the muscle was retracted posteriorly and protected throughout the case. I then used electrocautery to incise the tensor fascia lata fascia control and all bleeders. Immediately visible was the fat over top of the anterior neck and capsule.  I removed the anterior fat from the capsule and elevated the rectus muscle off of the anterior capsule. I then removed a large time of capsule. The retractors were then placed over the anterior acetabulum as well as around the superior and inferior neck.  I then made a femoral neck cut. Then used the power corkscrew to remove the femoral head from the acetabulum and thoroughly irrigated the acetabulum. I sized the femoral head.    I then exposed the deep acetabulum, cleared out any tissue including the ligamentum teres.   After adequate visualization, I excised the labrum, and then sequentially reamed.  I then impacted the acetabular implant into place using fluoroscopy for guidance.  Appropriate version and inclination was confirmed clinically matching their bony anatomy, and with fluoroscopy.  I placed a 20 mm screw in the posterior/superio position with an excellent bite.    I then placed the polyethylene  liner in place  I then adducted the leg and released the external rotators from the posterior femur allowing it to be easily delivered up lateral and anterior to the acetabulum for preparation of the femoral canal.    I then prepared the proximal femur using the cookie-cutter and then sequentially reamed and broached.  A trial broach, neck, and head was utilized, and I reduced the hip and used  floroscopy to assess the neck length and femoral implant.  I then impacted the femoral prosthesis into place into the appropriate version. The hip was then reduced and fluoroscopy confirmed appropriate position. Leg lengths were restored.  I then irrigated the hip copiously again with, and repaired the fascia with Vicryl, followed by monocryl for the subcutaneous tissue, Monocryl for the skin, Steri-Strips and sterile gauze. The patient was then awakened and returned to PACU in stable and satisfactory condition. There were no complications.  POST OPERATIVE PLAN: WBAT, DVT px: SCD's/TED, ambulation and chemical dvt px  Edmonia Lynch, MD Orthopedic Surgeon [phone]

## 2019-01-30 NOTE — Anesthesia Procedure Notes (Signed)
Spinal  Patient location during procedure: OR End time: 01/30/2019 7:24 AM Staffing Anesthesiologist: Annye Asa, MD Performed: anesthesiologist  Preanesthetic Checklist Completed: patient identified, site marked, surgical consent, pre-op evaluation, timeout performed, IV checked, risks and benefits discussed and monitors and equipment checked Spinal Block Patient position: sitting Prep: site prepped and draped and DuraPrep Patient monitoring: blood pressure, continuous pulse ox, cardiac monitor and heart rate Approach: midline Location: L3-4 Injection technique: single-shot Needle Needle type: Pencan  Needle gauge: 24 G Needle length: 9 cm Additional Notes Pt identified in Operating room.  Monitors applied. Working IV access confirmed. Sterile prep, drape lumbar spine.  1% lido local L 3,4.  #24ga Pencan into clear CSF L 3,4.  13.5mg  0.75% Bupivacaine with dextrose injected with asp CSF beginning and end of injection.  Patient asymptomatic, VSS, no heme aspirated, tolerated well.  Jenita Seashore, MD

## 2019-01-30 NOTE — Evaluation (Signed)
Physical Therapy Evaluation Patient Details Name: Gregory Eaton MRN: [id number] DOB: 1945/08/31 Today's Date: 01/30/2019   History of Present Illness  73 yo male s/p L DA-THA on 01/30/19. PMH includes CAD with CABG x5, PVD s/p L fempop bypass, HTN, esophageal cancer s/p resection, GERD, COPD, HLD.  Clinical Impression   Pt presents with mild L hip pain, post-operative L hip weakness, increased time and effort to perform mobility tasks, and decreased activity tolerance. Pt to benefit from acute PT to address deficits. Pt ambulated 49 ft with RW with min guard assist, verbal cuing for form and safety provided throughout. Pt educated on ankle pumps (20/hour), quad sets, and limited ROM heel slides to perform this afternoon/evening to increase circulation and reduce stiffness, to pt's tolerance and limited by pain. PT to progress mobility as tolerated, and will continue to follow acutely.        Follow Up Recommendations Follow surgeon's recommendation for DC plan and follow-up therapies;Supervision for mobility/OOB(HHPT)    Equipment Recommendations  None recommended by PT    Recommendations for Other Services       Precautions / Restrictions Precautions Precautions: Fall Restrictions Weight Bearing Restrictions: No Other Position/Activity Restrictions: WBAT      Mobility  Bed Mobility Overal bed mobility: Needs Assistance Bed Mobility: Supine to Sit     Supine to sit: Min guard;HOB elevated     General bed mobility comments: min guard for safety, verbal cuing for sequencing. Increased time and effort.  Transfers Overall transfer level: Needs assistance Equipment used: Rolling walker (2 wheeled) Transfers: Sit to/from Stand Sit to Stand: Min guard;From elevated surface         General transfer comment: Min guard for safety. Verbal cuing for hand placement when rising.  Ambulation/Gait Ambulation/Gait assistance: Min guard;+2 safety/equipment Gait Distance (Feet): 80  Feet Assistive device: Rolling walker (2 wheeled) Gait Pattern/deviations: Step-to pattern;Step-through pattern;Decreased stride length;Trunk flexed Gait velocity: decr   General Gait Details: Min guard for safety. Verbal cuing for upright posture, relaxing shoulders with RW use, placement in RW, sequencing with LLE leading.  Stairs            Wheelchair Mobility    Modified Rankin (Stroke Patients Only)       Balance Overall balance assessment: Mild deficits observed, not formally tested                                           Pertinent Vitals/Pain Pain Assessment: 0-10 Pain Score: 3  Pain Location: L hip Pain Descriptors / Indicators: Sore Pain Intervention(s): Limited activity within patient's tolerance;Monitored during session;Premedicated before session;Repositioned;Ice applied    Home Living Family/patient expects to be discharged to:: Private residence Living Arrangements: Spouse/significant other Available Help at Discharge: Family;Available PRN/intermittently Type of Home: House Home Access: Ramped entrance     Home Layout: One level Home Equipment: Walker - 2 wheels;Bedside commode;Shower seat;Cane - single point      Prior Function Level of Independence: Independent         Comments: Pt likes spending time in his yard     Hand Dominance   Dominant Hand: Right    Extremity/Trunk Assessment   Upper Extremity Assessment Upper Extremity Assessment: Overall WFL for tasks assessed    Lower Extremity Assessment Lower Extremity Assessment: Overall WFL for tasks assessed;LLE deficits/detail LLE Deficits / Details: suspected post-surgical hip weakness; able to perform  ankle pumps, quad set, heel slide, LLE lifting during bed mobility LLE Sensation: WNL    Cervical / Trunk Assessment Cervical / Trunk Assessment: Normal  Communication   Communication: No difficulties  Cognition Arousal/Alertness: Awake/alert Behavior During  Therapy: WFL for tasks assessed/performed Overall Cognitive Status: Within Functional Limits for tasks assessed                                        General Comments      Exercises     Assessment/Plan    PT Assessment Patient needs continued PT services  PT Problem List Decreased strength;Decreased mobility;Decreased range of motion;Decreased activity tolerance;Decreased balance;Decreased knowledge of use of DME;Pain       PT Treatment Interventions DME instruction;Therapeutic activities;Gait training;Therapeutic exercise;Patient/family education;Balance training;Functional mobility training    PT Goals (Current goals can be found in the Care Plan section)  Acute Rehab PT Goals Patient Stated Goal: go home, stop hurting PT Goal Formulation: With patient Time For Goal Achievement: 02/06/19 Potential to Achieve Goals: Good    Frequency 7X/week   Barriers to discharge        Co-evaluation               AM-PAC PT "6 Clicks" Mobility  Outcome Measure Help needed turning from your back to your side while in a flat bed without using bedrails?: A Little Help needed moving from lying on your back to sitting on the side of a flat bed without using bedrails?: A Little Help needed moving to and from a bed to a chair (including a wheelchair)?: A Little Help needed standing up from a chair using your arms (e.g., wheelchair or bedside chair)?: A Little Help needed to walk in hospital room?: A Little Help needed climbing 3-5 steps with a railing? : A Little 6 Click Score: 18    End of Session Equipment Utilized During Treatment: Gait belt Activity Tolerance: Patient tolerated treatment well Patient left: in chair;with chair alarm set;with call bell/phone within reach;with SCD's reapplied Nurse Communication: Mobility status PT Visit Diagnosis: Other abnormalities of gait and mobility (R26.89);Difficulty in walking, not elsewhere classified (R26.2)    Time:  6269-4854 PT Time Calculation (min) (ACUTE ONLY): 20 min   Charges:   PT Evaluation $PT Eval Low Complexity: 1 Low         Gregory Eaton, PT Acute Rehabilitation Services Pager [phone]  Office [phone]  Gregory Eaton 01/30/2019, 2:56 PM

## 2019-01-31 ENCOUNTER — Encounter (HOSPITAL_COMMUNITY): Payer: Self-pay | Admitting: Orthopedic Surgery

## 2019-01-31 MED ORDER — CLOPIDOGREL BISULFATE 75 MG PO TABS
75.0000 mg | ORAL_TABLET | Freq: Every day | ORAL | Status: DC
  Administered 2019-01-31: 75 mg via ORAL
  Filled 2019-01-31: qty 1

## 2019-01-31 NOTE — Progress Notes (Signed)
Physical Therapy Treatment Patient Details Name: Gregory Eaton MRN: [id number] DOB: Jan 14, 1946 Today's Date: 01/31/2019    History of Present Illness 73 yo male s/p L DA-THA on 01/30/19. PMH includes CAD with CABG x5, PVD s/p L fempop bypass, HTN, esophageal cancer s/p resection, GERD, COPD, HLD.    PT Comments    Progressing very well with mobility. Reviewed/practiced exercises, gait training, and stair training. All education completed. Okay to d/c from PT standpoint-made RN aware.    Follow Up Recommendations  Follow surgeon's recommendation for DC plan and follow-up therapies     Equipment Recommendations  None recommended by PT    Recommendations for Other Services       Precautions / Restrictions Precautions Precautions: Fall Restrictions Weight Bearing Restrictions: No Other Position/Activity Restrictions: WBAT    Mobility  Bed Mobility               General bed mobility comments: oob in recliner  Transfers Overall transfer level: Needs assistance Equipment used: Rolling walker (2 wheeled) Transfers: Sit to/from Stand Sit to Stand: Supervision         General transfer comment: for safety. Verbal cuing for hand placement when rising.  Ambulation/Gait Ambulation/Gait assistance: Supervision Gait Distance (Feet): 150 Feet Assistive device: Rolling walker (2 wheeled) Gait Pattern/deviations: Step-through pattern;Decreased stride length     General Gait Details: for safety.   Stairs             Wheelchair Mobility    Modified Rankin (Stroke Patients Only)       Balance Overall balance assessment: Mild deficits observed, not formally tested                                          Cognition Arousal/Alertness: Awake/alert Behavior During Therapy: WFL for tasks assessed/performed Overall Cognitive Status: Within Functional Limits for tasks assessed                                        Exercises  Total Joint Exercises Ankle Circles/Pumps: AROM;Both;10 reps;Seated Quad Sets: AROM;Both;10 reps;Seated Heel Slides: AROM;Left;10 reps;Seated Hip ABduction/ADduction: AROM;Left;10 reps;Seated Long Arc Quad: AROM;Left;10 reps;Seated Knee Flexion: AROM;Left;10 reps;Standing Marching in Standing: AROM;Both;10 reps;Standing General Exercises - Lower Extremity Heel Raises: AROM;Both;10 reps;Standing    General Comments        Pertinent Vitals/Pain Pain Assessment: 0-10 Pain Score: 4  Pain Location: L hip Pain Descriptors / Indicators: Sore Pain Intervention(s): Monitored during session;Ice applied    Home Living                      Prior Function            PT Goals (current goals can now be found in the care plan section) Progress towards PT goals: Progressing toward goals    Frequency    7X/week      PT Plan Current plan remains appropriate    Co-evaluation              AM-PAC PT "6 Clicks" Mobility   Outcome Measure  Help needed turning from your back to your side while in a flat bed without using bedrails?: A Little Help needed moving from lying on your back to sitting on the side of a flat bed without using bedrails?:  A Little Help needed moving to and from a bed to a chair (including a wheelchair)?: A Little Help needed standing up from a chair using your arms (e.g., wheelchair or bedside chair)?: A Little Help needed to walk in hospital room?: A Little Help needed climbing 3-5 steps with a railing? : A Little 6 Click Score: 18    End of Session Equipment Utilized During Treatment: Gait belt Activity Tolerance: Patient tolerated treatment well Patient left: in chair;with call bell/phone within reach   PT Visit Diagnosis: Other abnormalities of gait and mobility (R26.89);Difficulty in walking, not elsewhere classified (R26.2)     Time: 2707-8675 PT Time Calculation (min) (ACUTE ONLY): 12 min  Charges:  $Gait Training: 8-22 mins                        Weston Anna, PT Acute Rehabilitation Services Pager: [phone] Office: [phone]

## 2019-01-31 NOTE — Discharge Summary (Addendum)
Discharge Summary  Patient ID: Gregory Eaton MRN: [id number] DOB/AGE: 1946/03/10 73 y.o.  Admit date: 01/30/2019 Discharge date: 01/31/2019  Admission Diagnoses:  Primary osteoarthritis of left hip  Discharge Diagnoses:  Principal Problem:   Primary osteoarthritis of left hip Active Problems:   Essential hypertension   Coronary atherosclerosis   GERD   Left carotid artery stenosis   COPD GOLD II    Cancer of abdominal esophagus (HCC)   S/P total hip arthroplasty   Past Medical History:  Diagnosis Date  . Adenomatous colon polyp 03/2009   Last colonoscopy by Dr. [name]   . Adenomatous polyp 2010  . Adenomatous polyp of colon 11/03/2010  . Arthritis   . Barrett's esophagus   . CAD (coronary artery disease)   . Complication of anesthesia    has a shortened esophogus due to CA.   Marland Kitchen COPD (chronic obstructive pulmonary disease) (HCC)    Severe emphysema per CT  . Diverticulosis   . Esophageal carcinoma ([place]) 03/2009   T1N1M0  . GERD (gastroesophageal reflux disease)   . Glaucoma   . History of Doppler ultrasound 11/09/2011   03/2014- 50-69% L ICA stenosis;carotid doppler; L bulb/prox ICA 0-49% diameter reduction; L vertebral artery - occlusive ds; L ECA  demonstrates severe amount of fibrous plaque  . History of Doppler ultrasound 11/09/11   LEAs; R ABI - mod art. insuff.; L ABI normal at rest; R SFA - occlusive ds; L SFA - occlusive ds; patent fem-pop graft  . History of echocardiogram 08/27/2009   EF >55%  . History of kidney stones 1965  . History of nuclear stress test 11/24/2011   lexiscan; normal perfusion; low risk scan; non-diagnostic for ischemia  . Hyperlipidemia   . Hypertension   . Left carotid artery stenosis 04/08/2014  . Pulmonary nodule, right 04/08/2014   2.8 mm-incidental finding on CT  . PVD (peripheral vascular disease) ([place])   . Tachyarrhythmia 1999   Status post ablation at [facility]  . Tobacco abuse     Surgeries: Procedure(s): TOTAL HIP ARTHROPLASTY  ANTERIOR APPROACH on 01/30/2019   Consultants (if any):   Discharged Condition: Improved  Hospital Course: Gregory Eaton is an 73 y.o. male who was admitted 01/30/2019 with a diagnosis of Primary osteoarthritis of left hip and went to the operating room on 01/30/2019 and underwent the above named procedures.    He was given perioperative antibiotics:  Anti-infectives (From admission, onward)   Start     Dose/Rate Route Frequency Ordered Stop   01/30/19 1400  ceFAZolin (ANCEF) IVPB 1 g/50 mL premix     1 g 100 mL/hr over 30 Minutes Intravenous Every 6 hours 01/30/19 1102 01/30/19 2034   01/30/19 0600  ceFAZolin (ANCEF) IVPB 2g/100 mL premix     2 g 200 mL/hr over 30 Minutes Intravenous On call to O.R. 01/30/19 1443 01/30/19 0720    .  He was given sequential compression devices, early ambulation, and aspirin for DVT prophylaxis.  Plavix was resumed postop day 1.  He benefited maximally from the hospital stay and there were no complications.    Recent vital signs:  Vitals:   01/31/19 0116 01/31/19 0547  BP: 131/68 (!) 151/57  Pulse: 79 81  Resp: 16 18  Temp: 97.7 F (36.5 C) 98 F (36.7 C)  SpO2: 95% 99%    Recent laboratory studies:  Lab Results  Component Value Date   HGB 14.9 01/22/2019   HGB 14.6 10/19/2017   HGB 13.3 06/02/2016  Lab Results  Component Value Date   WBC 7.9 01/22/2019   PLT 312 01/22/2019   No results found for: INR Lab Results  Component Value Date   NA 143 01/22/2019   K 4.8 01/22/2019   CL 110 01/22/2019   CO2 26 01/22/2019   BUN 16 01/22/2019   CREATININE 0.83 01/22/2019   GLUCOSE 113 (H) 01/22/2019    Discharge Medications:   Allergies as of 01/31/2019      Reactions   Altace [ramipril] Cough      Medication List    TAKE these medications   acetaminophen 500 MG tablet Commonly known as: TYLENOL Take 500 mg by mouth daily as needed for headache.   albuterol (2.5 MG/3ML) 0.083% nebulizer solution Commonly known as:  PROVENTIL Take 3 mLs (2.5 mg total) by nebulization every 6 (six) hours as needed for wheezing or shortness of breath.   ALPRAZolam 0.5 MG tablet Commonly known as: XANAX Take 1 tablet (0.5 mg total) by mouth at bedtime.   amLODipine 10 MG tablet Commonly known as: NORVASC Take 1 tablet (10 mg total) by mouth daily.   aspirin EC 81 MG tablet Take 1 tablet (81 mg total) by mouth 2 (two) times daily. For DVT prophylaxis for 30 days after surgery. What changed:   when to take this  additional instructions   baclofen 10 MG tablet Commonly known as: LIORESAL Take 1 tablet (10 mg total) by mouth 3 (three) times daily as needed for muscle spasms.   brimonidine 0.2 % ophthalmic solution Commonly known as: ALPHAGAN Place 1 drop into the right eye at bedtime.   CENTRUM SILVER PO Take 1 tablet by mouth daily.   clopidogrel 75 MG tablet Commonly known as: PLAVIX TAKE ONE (1) TABLET BY MOUTH EVERY DAY   dexlansoprazole 60 MG capsule Commonly known as: Dexilant Take 1 capsule (60 mg total) by mouth daily before supper.   diclofenac sodium 1 % Gel Commonly known as: VOLTAREN Apply 2 g topically 4 (four) times daily. Rub into affected area of foot 2 to 4 times daily What changed:   when to take this  reasons to take this   gabapentin 100 MG capsule Commonly known as: Neurontin Take 1 capsule (100 mg total) by mouth 2 (two) times daily as needed for up to 14 days. For pain.   Glycopyrrolate-Formoterol 9-4.8 MCG/ACT Aero Commonly known as: Airline pilot Inhale 2 puffs into the lungs 2 (two) times a day. What changed: when to take this   HYDROcodone-acetaminophen 5-325 MG tablet Commonly known as: Norco Take 1-2 tablets by mouth every 6 (six) hours as needed for severe pain.   latanoprost 0.005 % ophthalmic solution Commonly known as: XALATAN Place 1 drop into both eyes at bedtime.   losartan 100 MG tablet Commonly known as: COZAAR Take 1 tablet (100 mg total) by  mouth at bedtime.   metoprolol succinate 50 MG 24 hr tablet Commonly known as: TOPROL-XL TAKE ONE (1) TABLET BY MOUTH EVERY DAY   montelukast 10 MG tablet Commonly known as: SINGULAIR Take 1 tablet (10 mg total) by mouth daily.   Mucinex 600 MG 12 hr tablet Generic drug: guaiFENesin Take 1,200 mg by mouth every 12 (twelve) hours as needed (cough).   nitroGLYCERIN 0.4 MG SL tablet Commonly known as: Nitrostat Place 1 tablet (0.4 mg total) under the tongue every 5 (five) minutes as needed for chest pain.   ondansetron 4 MG tablet Commonly known as: Zofran Take 1 tablet (4 mg  total) by mouth every 8 (eight) hours as needed for nausea or vomiting.   rosuvastatin 10 MG tablet Commonly known as: CRESTOR TAKE ONE (1) TABLET BY MOUTH EVERY DAY       Diagnostic Studies: Dg C-arm 1-60 Min-no Report  Result Date: 01/30/2019 Fluoroscopy was utilized by the requesting physician.  No radiographic interpretation.   Dg Hip Operative Unilat W Or W/o Pelvis Left  Result Date: 01/30/2019 CLINICAL DATA:  Left hip replacement. EXAM: OPERATIVE left HIP (WITH PELVIS IF PERFORMED) 6 VIEWS TECHNIQUE: Fluoroscopic spot image(s) were submitted for interpretation post-operatively. Radiation exposure index: 1.22 mGy. COMPARISON:  Radiographs of Nov 18, 2016. FINDINGS: Six intraoperative fluoroscopic images were obtained of the left hip. These demonstrate surgical placement of the left acetabular and femoral components. Expected postoperative changes are noted in the surrounding soft tissues. IMPRESSION: Fluoroscopic guidance provided during left total hip arthroplasty. Electronically Signed   By: Marijo Conception M.D.   On: 01/30/2019 08:56    Disposition: Discharge disposition: 01-Home or Self Care       Discharge Instructions    Discharge patient   Complete by: As directed    After AM therapy eval   Discharge disposition: 01-Home or Self Care   Discharge patient date: 01/31/2019       Follow-up Information    Renette Butters, MD.   Specialty: Orthopedic Surgery Contact information: [address] [place] [address] [phone]            Signed: Prudencio Burly III PA-C 01/31/2019, 7:11 AM

## 2019-01-31 NOTE — Progress Notes (Signed)
    Subjective: Patient reports pain as mild, controlled.  Tolerating diet.  Urinating.  No CP, SOB.  Good early mobilization with therapy.  Objective:   VITALS:   Vitals:   01/30/19 1817 01/30/19 2059 01/31/19 0116 01/31/19 0547  BP: (!) 109/59 (!) 150/51 131/68 (!) 151/57  Pulse: 83 83 79 81  Resp: 16 18 16 18   Temp: 98.4 F (36.9 C) 98.4 F (36.9 C) 97.7 F (36.5 C) 98 F (36.7 C)  TempSrc: Oral Oral Oral Oral  SpO2: 94% 98% 95% 99%  Weight:      Height:       CBC Latest Ref Rng & Units 01/22/2019 10/19/2017 06/02/2016  WBC 4.0 - 10.5 K/uL 7.9 6.4 14.6(H)  Hemoglobin 13.0 - 17.0 g/dL 14.9 14.6 13.3  Hematocrit 39.0 - 52.0 % 46.7 44.1 40.1  Platelets 150 - 400 K/uL 312 282 531(H)   BMP Latest Ref Rng & Units 01/22/2019 10/19/2017 06/02/2016  Glucose 70 - 99 mg/dL 113(H) 142(H) 115(H)  BUN 8 - 23 mg/dL 16 13 10   Creatinine 0.61 - 1.24 mg/dL 0.83 0.82 0.85  Sodium 135 - 145 mmol/L 143 139 137  Potassium 3.5 - 5.1 mmol/L 4.8 4.2 4.0  Chloride 98 - 111 mmol/L 110 105 104  CO2 22 - 32 mmol/L 26 21(L) 25  Calcium 8.9 - 10.3 mg/dL 9.5 9.1 8.6(L)   Intake/Output      07/14 0701 - 07/15 0700 07/15 0701 - 07/16 0700   P.O. 720    I.V. (mL/kg) 2750 (38.1)    IV Piggyback 150    Total Intake(mL/kg) 3620 (50.2)    Urine (mL/kg/hr) 4025 (2.3)    Stool 0    Blood 200    Total Output 4225    Net -605.1         Urine Occurrence 0 x    Stool Occurrence 0 x       Physical Exam: General: NAD.  Upright in bed.  Calm, conversant. Resp: No increased wob Cardio: regular rate and rhythm ABD soft Neurologically intact MSK LLE: Neurovascularly intact Sensation intact distally Feet warm  Dorsiflexion/Plantar flexion intact Incision: dressing C/D/I   Assessment: 1 Day Post-Op  S/P Procedure(s) (LRB): TOTAL HIP ARTHROPLASTY ANTERIOR APPROACH (Left) by Dr. Ernesta Amble. Percell Miller on 01/30/2019  Principal Problem:   Primary osteoarthritis of left hip Active Problems:   Essential  hypertension   Coronary atherosclerosis   GERD   Left carotid artery stenosis   COPD GOLD II    Cancer of abdominal esophagus (HCC)   S/P total hip arthroplasty   Primary osteoarthritis, status post total hip arthroplasty Doing well postop day 1 Eating, drinking, and voiding Pain controlled Good early mobilization  Plan: Up with therapy D/C IV fluids Discharge home with home health Incentive Spirometry Apply ice   Weight Bearing: Weight Bearing as Tolerated (WBAT) LLE Dressings: Maintain Mepilex.   VTE prophylaxis: Aspirin, SCDs, ambulation.  Resume Plavix.  Dispo: Home after a.m. therapy evaluation   Prudencio Burly III, PA-C 01/31/2019, 7:08 AM

## 2019-02-01 ENCOUNTER — Other Ambulatory Visit: Payer: Self-pay | Admitting: Physician Assistant

## 2019-02-14 DIAGNOSIS — M1612 Unilateral primary osteoarthritis, left hip: Secondary | ICD-10-CM | POA: Diagnosis not present

## 2019-02-15 ENCOUNTER — Other Ambulatory Visit: Payer: Self-pay | Admitting: Physician Assistant

## 2019-02-21 DIAGNOSIS — M25552 Pain in left hip: Secondary | ICD-10-CM | POA: Diagnosis not present

## 2019-02-21 DIAGNOSIS — R262 Difficulty in walking, not elsewhere classified: Secondary | ICD-10-CM | POA: Diagnosis not present

## 2019-02-21 DIAGNOSIS — M6281 Muscle weakness (generalized): Secondary | ICD-10-CM | POA: Diagnosis not present

## 2019-02-21 DIAGNOSIS — M25652 Stiffness of left hip, not elsewhere classified: Secondary | ICD-10-CM | POA: Diagnosis not present

## 2019-03-01 DIAGNOSIS — M25652 Stiffness of left hip, not elsewhere classified: Secondary | ICD-10-CM | POA: Diagnosis not present

## 2019-03-01 DIAGNOSIS — M6281 Muscle weakness (generalized): Secondary | ICD-10-CM | POA: Diagnosis not present

## 2019-03-01 DIAGNOSIS — R262 Difficulty in walking, not elsewhere classified: Secondary | ICD-10-CM | POA: Diagnosis not present

## 2019-03-01 DIAGNOSIS — M25552 Pain in left hip: Secondary | ICD-10-CM | POA: Diagnosis not present

## 2019-03-08 ENCOUNTER — Ambulatory Visit: Payer: Medicare Other | Admitting: Pulmonary Disease

## 2019-03-12 ENCOUNTER — Ambulatory Visit (INDEPENDENT_AMBULATORY_CARE_PROVIDER_SITE_OTHER): Payer: Medicare Other | Admitting: Pulmonary Disease

## 2019-03-12 ENCOUNTER — Encounter: Payer: Self-pay | Admitting: Pulmonary Disease

## 2019-03-12 ENCOUNTER — Other Ambulatory Visit: Payer: Self-pay

## 2019-03-12 VITALS — BP 142/60 | HR 81 | Temp 98.3°F | Ht 67.5 in | Wt 158.4 lb

## 2019-03-12 DIAGNOSIS — J449 Chronic obstructive pulmonary disease, unspecified: Secondary | ICD-10-CM

## 2019-03-12 MED ORDER — PREDNISONE 10 MG PO TABS: 40.0000 mg | ORAL_TABLET | Freq: Every day | ORAL | 0 refills | Status: AC

## 2019-03-12 MED ORDER — BEVESPI AEROSPHERE 9-4.8 MCG/ACT IN AERO: 2.0000 | INHALATION_SPRAY | Freq: Two times a day (BID) | RESPIRATORY_TRACT | 0 refills | Status: DC

## 2019-03-12 NOTE — Progress Notes (Signed)
Subjective:   PATIENT ID: Gregory Eaton GENDER: male DOB: 1946-04-13, MRN: [id number]   HPI  Chief Complaint  Patient presents with  . Follow-up    copd    Reason for Visit: Follow-up   [name] is a 73 year old male former smoker with COPD (FEV1 59%), status post CABG, hypertension, PVD and history of esophageal cancer status post esophagectomy and gastric pull-through in 2010 who presents for follow-up.  He was previously followed by Dr. Lenna Eaton for management of his emphysema. Clinic notes reviewed in EMR: When last seen on 05/09/2018, he has remained stable on his Symbicort and Spiriva. He was also recently seen by Dr. Melvyn Eaton in [place] [facility] for surgical clearance for hip arthroplasty.  On his documentation, patient started Anoro and reported throat clearing.  No interval exacerbations.  Advised to use DuoNebs peri-op.  Since his last visit, he underwent hip arthroplasty. His Anoro was changed to Bevespi due to insurance. He has been tolerating the inhaler change well. Denies shortness of breath and wheezing. Reports chronic cough related to his chronic reflux. Denies any change with cough after changing in inhalers. Last COPD exacerbation requiring hospitalization 3 years. Denies last outpatient exacerbation over 1 year ago. Has not required nebulizer. He is active at home and performs activities including yardwork, lawnmowing and painting. No limitations in house. Does report single episode of hemoptysis estimated to be about half-dollar coin size with a mix of clot and frank blood. Has not had any further episodes. Denies chest pain, dizziness, headaches.   Social History: 60-pack-year smoking history. Quit in 1997 Previous employed as a Administrator, previously did Dealer work  Programme researcher, broadcasting/film/video exposures: Hx of Architect x 2 years  I have personally reviewed patient's past medical/family/social history, allergies, current medications.  Past Medical History:   Diagnosis Date  . Adenomatous colon polyp 03/2009   Last colonoscopy by Dr. Gala Eaton   . Adenomatous polyp 2010  . Adenomatous polyp of colon 11/03/2010  . Arthritis   . Barrett's esophagus   . CAD (coronary artery disease)   . Complication of anesthesia    has a shortened esophogus due to CA.   Marland Kitchen COPD (chronic obstructive pulmonary disease) (HCC)    Severe emphysema per CT  . Diverticulosis   . Esophageal carcinoma ([place]) 03/2009   T1N1M0  . GERD (gastroesophageal reflux disease)   . Glaucoma   . History of Doppler ultrasound 11/09/2011   03/2014- 50-69% L ICA stenosis;carotid doppler; L bulb/prox ICA 0-49% diameter reduction; L vertebral artery - occlusive ds; L ECA  demonstrates severe amount of fibrous plaque  . History of Doppler ultrasound 11/09/11   LEAs; R ABI - mod art. insuff.; L ABI normal at rest; R SFA - occlusive ds; L SFA - occlusive ds; patent fem-pop graft  . History of echocardiogram 08/27/2009   EF >55%  . History of kidney stones 1965  . History of nuclear stress test 11/24/2011   lexiscan; normal perfusion; low risk scan; non-diagnostic for ischemia  . Hyperlipidemia   . Hypertension   . Left carotid artery stenosis 04/08/2014  . Pulmonary nodule, right 04/08/2014   2.8 mm-incidental finding on CT  . PVD (peripheral vascular disease) ([place])   . Tachyarrhythmia 1999   Status post ablation at [facility]  . Tobacco abuse      Family History  Problem Relation Age of Onset  . GER disease Mother   . Coronary artery disease Brother   . Congenital heart disease Sister   .  Colon cancer Neg Hx      Social History   Occupational History  . Occupation: retired    Fish farm manager: RETIRED    Comment: truck Geophysicist/field seismologist  Tobacco Use  . Smoking status: Former Smoker    Packs/day: 2.00    Years: 40.00    Pack years: 80.00    Types: Cigarettes    Start date: 78    Quit date: 07/20/1995    Years since quitting: 23.6  . Smokeless tobacco: Never Used  Substance and Sexual Activity  .  Alcohol use: No    Alcohol/week: 0.0 standard drinks  . Drug use: No  . Sexual activity: Yes    Partners: Female    Birth control/protection: Condom    Comment: friend    Allergies  Allergen Reactions  . Altace [Ramipril] Cough     Outpatient Medications Prior to Visit  Medication Sig Dispense Refill  . acetaminophen (TYLENOL) 500 MG tablet Take 500 mg by mouth daily as needed for headache.    . albuterol (PROVENTIL) (2.5 MG/3ML) 0.083% nebulizer solution Take 3 mLs (2.5 mg total) by nebulization every 6 (six) hours as needed for wheezing or shortness of breath. 75 mL 12  . ALPRAZolam (XANAX) 0.5 MG tablet Take 1 tablet (0.5 mg total) by mouth at bedtime. 30 tablet 0  . amLODipine (NORVASC) 10 MG tablet Take 1 tablet (10 mg total) by mouth daily. 90 tablet 3  . aspirin EC 81 MG tablet Take 1 tablet (81 mg total) by mouth 2 (two) times daily. For DVT prophylaxis for 30 days after surgery. 60 tablet 0  . baclofen (LIORESAL) 10 MG tablet Take 1 tablet (10 mg total) by mouth 3 (three) times daily as needed for muscle spasms. 20 each 0  . brimonidine (ALPHAGAN) 0.2 % ophthalmic solution Place 1 drop into the right eye at bedtime.     . clopidogrel (PLAVIX) 75 MG tablet TAKE ONE (1) TABLET BY MOUTH EVERY DAY 90 tablet 3  . dexlansoprazole (DEXILANT) 60 MG capsule Take 1 capsule (60 mg total) by mouth daily before supper. 30 capsule 11  . diclofenac sodium (VOLTAREN) 1 % GEL Apply 2 g topically 4 (four) times daily. Rub into affected area of foot 2 to 4 times daily (Patient taking differently: Apply 2 g topically 4 (four) times daily as needed (pain). Rub into affected area of foot 2 to 4 times daily) 100 g 2  . Glycopyrrolate-Formoterol (BEVESPI AEROSPHERE) 9-4.8 MCG/ACT AERO Inhale 2 puffs into the lungs 2 (two) times a day. (Patient taking differently: Inhale 2 puffs into the lungs daily. ) 1 Inhaler 11  . guaiFENesin (MUCINEX) 600 MG 12 hr tablet Take 1,200 mg by mouth every 12 (twelve) hours  as needed (cough).     Marland Kitchen HYDROcodone-acetaminophen (NORCO) 5-325 MG tablet Take 1-2 tablets by mouth every 6 (six) hours as needed for severe pain. 30 tablet 0  . latanoprost (XALATAN) 0.005 % ophthalmic solution Place 1 drop into both eyes at bedtime.     Marland Kitchen losartan (COZAAR) 100 MG tablet TAKE ONE TABLET BY MOUTH EVERY NIGHT AT BEDTIME 90 tablet 3  . metoprolol succinate (TOPROL-XL) 50 MG 24 hr tablet TAKE ONE (1) TABLET BY MOUTH EVERY DAY 90 tablet 3  . montelukast (SINGULAIR) 10 MG tablet Take 1 tablet (10 mg total) by mouth daily. 90 tablet 3  . Multiple Vitamins-Minerals (CENTRUM SILVER PO) Take 1 tablet by mouth daily.      . nitroGLYCERIN (NITROSTAT) 0.4 MG SL  tablet Place 1 tablet (0.4 mg total) under the tongue every 5 (five) minutes as needed for chest pain. 25 tablet 3  . ondansetron (ZOFRAN) 4 MG tablet Take 1 tablet (4 mg total) by mouth every 8 (eight) hours as needed for nausea or vomiting. 20 tablet 0  . rosuvastatin (CRESTOR) 10 MG tablet TAKE ONE (1) TABLET BY MOUTH EVERY DAY 90 tablet 3  . gabapentin (NEURONTIN) 100 MG capsule Take 1 capsule (100 mg total) by mouth 2 (two) times daily as needed for up to 14 days. For pain. 28 capsule 0   No facility-administered medications prior to visit.     Review of Systems  Constitutional: Negative for chills, diaphoresis, fever, malaise/fatigue and weight loss.  HENT: Negative for congestion, ear pain and sore throat.   Respiratory: Positive for cough and hemoptysis (single episode). Negative for sputum production, shortness of breath and wheezing.   Cardiovascular: Negative for chest pain, palpitations and leg swelling.  Gastrointestinal: Negative for abdominal pain, heartburn and nausea.  Genitourinary: Negative for frequency.  Musculoskeletal: Positive for joint pain. Negative for myalgias.  Skin: Negative for itching and rash.  Neurological: Negative for dizziness, weakness and headaches.  Endo/Heme/Allergies: Bruises/bleeds  easily.  Psychiatric/Behavioral: Negative for depression. The patient is not nervous/anxious.      Objective:   Vitals:   03/12/19 0855 03/12/19 0858  BP:  (!) 142/60  Pulse:  81  Temp: 98.3 F (36.8 C)   TempSrc: Oral   SpO2:  96%  Weight: 158 lb 6.4 oz (71.8 kg)   Height: 5' 7.5" (1.715 m)    SpO2: 96 % O2 Device: None (Room air)  Physical Exam: General: Well-appearing, no acute distress HENT: [place], AT, OP clear, MMM Eyes: EOMI, no scleral icterus Respiratory: Clear to auscultation bilaterally.  No crackles, wheezing or rales Cardiovascular: RRR, -M/R/G, no JVD GI: BS+, soft, nontender Extremities:-Edema,-tenderness Neuro: AAO x4, CNII-XII grossly intact Skin: Intact, no rashes or bruising Psych: Normal mood, normal affect  Data Reviewed:  Imaging: CT Chest 11/22/17 - Centrilobular and paraseptal emphysema. 20mm nodule in the RLL, unchanged since 2015  PFT: 03/23/2016 FVC 4.1 L (101 %) FEV1 1.8 (59 %) Ratio 58  Interpretation: Moderately severe obstructive defect.  Normal vital capacity.  Labs: CBC    Component Value Date/Time   WBC 7.9 01/22/2019 1430   RBC 4.97 01/22/2019 1430   HGB 14.9 01/22/2019 1430   HCT 46.7 01/22/2019 1430   PLT 312 01/22/2019 1430   MCV 94.0 01/22/2019 1430   MCH 30.0 01/22/2019 1430   MCHC 31.9 01/22/2019 1430   RDW 13.5 01/22/2019 1430   LYMPHSABS 1.2 05/25/2016 1119   MONOABS 1.4 (H) 05/25/2016 1119   EOSABS 0.0 05/25/2016 1119   BASOSABS 0.0 05/25/2016 1119   BMET    Component Value Date/Time   NA 143 01/22/2019 1430   K 4.8 01/22/2019 1430   CL 110 01/22/2019 1430   CO2 26 01/22/2019 1430   GLUCOSE 113 (H) 01/22/2019 1430   BUN 16 01/22/2019 1430   CREATININE 0.83 01/22/2019 1430   CREATININE 0.79 01/31/2013 0940   CALCIUM 9.5 01/22/2019 1430   GFRNONAA >60 01/22/2019 1430   GFRAA >60 01/22/2019 1430   Imaging, labs and tests noted above have been reviewed independently by me.    Assessment & Plan:    Discussion: 73 year old male with moderately severe COPD, hx esophageal CA s/p esophagectomy, hx CABG and PVD on DAPT and stable RLL lung nodule who presents for  follow-up.  Moderately severe COPD (FEV1 59%): Reviewed COPD hx and prior management in detail. Discussed reasoning for inhaler change including reducing pna risk for ICS inhalers GOLD Class B Continue Bevespi as instructed  I have provided you a course of steroids as needed. Please take if you have worsening shortness of breath, cough and wheezing. Remember to contact our office to let me know you had to take it.  Hemoptysis: Isolated episode in setting of anticoagulation, no further symptoms Advised patient to continue to monitor and contact office for further episodes May require flex bronch if persistent though likely related to chronic cough in setting of DAPT  RLL lung nodule: Stable 51mm nodule, unchanged since 2015 No further imaging indicated  Health Maintenance-Due. Patient declined, wishing to defer to end of October Immunization History  Administered Date(s) Administered  . Influenza Inj Mdck Quad Pf 04/17/2016  . Influenza, High Dose Seasonal PF 05/09/2017, 04/21/2018  . Influenza,inj,Quad PF,6+ Mos 04/19/2015, 04/17/2016  . Influenza-Unspecified 05/29/2010, 05/01/2014  . Pneumococcal Conjugate-13 04/01/2014  . Pneumococcal Polysaccharide-23 04/17/2016  . Td 05/17/2012   CT Lung Screen - Not qualified  No orders of the defined types were placed in this encounter.  Meds ordered this encounter  Medications  . predniSONE (DELTASONE) 10 MG tablet    Sig: Take 4 tablets (40 mg total) by mouth daily with breakfast for 5 days. Take for shortness of breath, cough, wheeze, call office if started medication    Dispense:  20 tablet    Refill:  0  . Glycopyrrolate-Formoterol (BEVESPI AEROSPHERE) 9-4.8 MCG/ACT AERO    Sig: Inhale 2 puffs into the lungs 2 (two) times daily.    Dispense:  5.9 g    Refill:  0    Order  Specific Question:   Lot Number?    Answer:   [id number]    Order Specific Question:   Expiration Date?    Answer:   12/18/2019    Order Specific Question:   Manufacturer?    Answer:   [employer] [[id number]]    Order Specific Question:   Quantity    Answer:   1    Return in about 6 months (around 09/12/2019).   Greater than 50% of this patient 40-minute office visit was spent face-to-face in counseling with the patient/family. We discussed medical diagnosis and treatment plan as noted.  [place] Carroll, MD [facility] 03/12/2019 9:28 AM  Office Number [phone]

## 2019-03-12 NOTE — Patient Instructions (Addendum)
Moderate severe COPD Continue Bevespi as instructed  I have provided you a course of steroids as needed. Please take if you have worsening shortness of breath, cough and wheezing. Remember to contact our office to let me know you had to take it.  Follow-up with me in 6 months

## 2019-03-14 DIAGNOSIS — M25552 Pain in left hip: Secondary | ICD-10-CM | POA: Diagnosis not present

## 2019-04-09 ENCOUNTER — Other Ambulatory Visit (HOSPITAL_COMMUNITY): Payer: Self-pay | Admitting: Physician Assistant

## 2019-04-09 ENCOUNTER — Other Ambulatory Visit: Payer: Self-pay

## 2019-04-09 ENCOUNTER — Ambulatory Visit (HOSPITAL_COMMUNITY)
Admission: RE | Admit: 2019-04-09 | Discharge: 2019-04-09 | Disposition: A | Discharge status: Home / Self Care | Payer: Medicare Other | Source: Ambulatory Visit | Attending: Cardiology | Admitting: Cardiology

## 2019-04-09 DIAGNOSIS — I25810 Atherosclerosis of coronary artery bypass graft(s) without angina pectoris: Secondary | ICD-10-CM

## 2019-04-09 DIAGNOSIS — I771 Stricture of artery: Secondary | ICD-10-CM

## 2019-04-09 DIAGNOSIS — I6523 Occlusion and stenosis of bilateral carotid arteries: Secondary | ICD-10-CM

## 2019-04-23 DIAGNOSIS — I10 Essential (primary) hypertension: Secondary | ICD-10-CM | POA: Diagnosis not present

## 2019-04-23 DIAGNOSIS — R7301 Impaired fasting glucose: Secondary | ICD-10-CM | POA: Diagnosis not present

## 2019-04-23 DIAGNOSIS — E782 Mixed hyperlipidemia: Secondary | ICD-10-CM | POA: Diagnosis not present

## 2019-04-23 DIAGNOSIS — E785 Hyperlipidemia, unspecified: Secondary | ICD-10-CM | POA: Diagnosis not present

## 2019-04-25 NOTE — Progress Notes (Signed)
The patient has been notified of the result and verbalized understanding.  All questions (if any) were answered. Jacqulynn Cadet, [place] 04/25/2019 4:54 PM

## 2019-04-27 DIAGNOSIS — J449 Chronic obstructive pulmonary disease, unspecified: Secondary | ICD-10-CM | POA: Diagnosis not present

## 2019-04-27 DIAGNOSIS — K219 Gastro-esophageal reflux disease without esophagitis: Secondary | ICD-10-CM | POA: Diagnosis not present

## 2019-04-27 DIAGNOSIS — I10 Essential (primary) hypertension: Secondary | ICD-10-CM | POA: Diagnosis not present

## 2019-04-27 DIAGNOSIS — E782 Mixed hyperlipidemia: Secondary | ICD-10-CM | POA: Diagnosis not present

## 2019-04-27 DIAGNOSIS — I2510 Atherosclerotic heart disease of native coronary artery without angina pectoris: Secondary | ICD-10-CM | POA: Diagnosis not present

## 2019-06-11 DIAGNOSIS — M545 Low back pain: Secondary | ICD-10-CM | POA: Diagnosis not present

## 2019-06-19 ENCOUNTER — Other Ambulatory Visit (HOSPITAL_COMMUNITY): Payer: Self-pay | Admitting: Internal Medicine

## 2019-06-19 DIAGNOSIS — M545 Low back pain, unspecified: Secondary | ICD-10-CM

## 2019-06-21 ENCOUNTER — Other Ambulatory Visit: Payer: Self-pay

## 2019-06-21 ENCOUNTER — Ambulatory Visit (HOSPITAL_COMMUNITY)
Admission: RE | Admit: 2019-06-21 | Discharge: 2019-06-21 | Disposition: A | Discharge status: Home / Self Care | Payer: Medicare Other | Source: Ambulatory Visit | Attending: Internal Medicine | Admitting: Internal Medicine

## 2019-06-21 DIAGNOSIS — M545 Low back pain, unspecified: Secondary | ICD-10-CM

## 2019-06-23 DIAGNOSIS — M545 Low back pain: Secondary | ICD-10-CM | POA: Diagnosis not present

## 2019-06-23 DIAGNOSIS — M5440 Lumbago with sciatica, unspecified side: Secondary | ICD-10-CM | POA: Diagnosis not present

## 2019-07-09 ENCOUNTER — Other Ambulatory Visit: Payer: Self-pay | Admitting: *Deleted

## 2019-07-09 MED ORDER — AMLODIPINE BESYLATE 10 MG PO TABS: 10.0000 mg | ORAL_TABLET | Freq: Every day | ORAL | 3 refills | Status: DC

## 2019-07-10 ENCOUNTER — Other Ambulatory Visit: Payer: Self-pay

## 2019-07-10 MED ORDER — AMLODIPINE BESYLATE 10 MG PO TABS: 10.0000 mg | ORAL_TABLET | Freq: Every day | ORAL | 3 refills | Status: DC

## 2019-07-23 ENCOUNTER — Other Ambulatory Visit: Payer: Self-pay

## 2019-07-23 ENCOUNTER — Ambulatory Visit: Payer: Medicare Other | Admitting: Cardiovascular Disease

## 2019-07-23 ENCOUNTER — Encounter: Payer: Self-pay | Admitting: Cardiovascular Disease

## 2019-07-23 VITALS — BP 152/71 | HR 75 | Ht 68.0 in | Wt 165.0 lb

## 2019-07-23 DIAGNOSIS — I10 Essential (primary) hypertension: Secondary | ICD-10-CM | POA: Diagnosis not present

## 2019-07-23 DIAGNOSIS — Z951 Presence of aortocoronary bypass graft: Secondary | ICD-10-CM

## 2019-07-23 DIAGNOSIS — I6522 Occlusion and stenosis of left carotid artery: Secondary | ICD-10-CM | POA: Diagnosis not present

## 2019-07-23 DIAGNOSIS — I739 Peripheral vascular disease, unspecified: Secondary | ICD-10-CM

## 2019-07-23 DIAGNOSIS — I25810 Atherosclerosis of coronary artery bypass graft(s) without angina pectoris: Secondary | ICD-10-CM

## 2019-07-23 DIAGNOSIS — E785 Hyperlipidemia, unspecified: Secondary | ICD-10-CM

## 2019-07-23 NOTE — Progress Notes (Signed)
Patient ID: Gregory Eaton, male   DOB: 1946/04/19, 74 y.o.   MRN: [id number]   PCP: Dr. Wende Neighbors  HPI: Gregory Eaton is a 74 y.o. male presents to the office today for a 42 month cardiology evaluation.  Gregory Eaton has CAD and underwent CABG revascularization surgery by Dr. Nils Pyle in 1998. In December 2010 Gregory Eaton underwent esophageal cancer surgery at [facility]. Additional problems include hypertension as well as hyperlipidemia in addition to peripheral vascular disease. Gregory Eaton has documented occluded left vertebral artery with normal antegrade flow the left external carotid and did have mild carotid stenoses are noted on his last Doppler study in detecting April 2013. Gregory Eaton also is status post femoropopliteal bypass surgery and has occlusive disease in his right SFA.   A followup nuclear perfusion study on 07/24/2013 continued to reveal normal perfusion without scar or ischemia, 17 years after his CABG revascularization surgery. Gregory Eaton had also undergone followup carotid studies which again showed an occluded left vertebral artery and mild disease in his right and left internal carotid system with narrowings less than 49%. Gregory Eaton also is status post femoropopliteal bypass surgery. His last LE Doppler study from 07/10/2013 showed ABIs of 1.4 bilaterally at his ankles. Gregory Eaton did have occlusive disease with reconstitution above the knee and the right SFA. His ABI values were now noncompressible.  Laboratory in August 2015 by his primary physician revealed mildly increased SGPT at 72 and an elevated calcium of 11.6.  Gregory Eaton was hospitalized in September 2015 with pneumonia.  Followup calcium level was normal at 9.2.  LFTs were normal.  Gregory Eaton did have a followup carotid duplex scan, which again showed a 50-69% stenosis of the left internal carotid artery.  Additionally, there was preocclusive stenosis and left external carotid.  Gregory Eaton was hospitalized from November 7 - June 03, 2016 and was felt to have sepsis due to  pneumonia.  Gregory Eaton initially was treated with vancomycin and Zosyn. Gregory Eaton has a history of remote esophageal cancer and is status post surgery.  Gregory Eaton has GERD which has been controlled with Protonix.    Whe I saw him in April 2018 Gregory Eaton denied any recurrent chest pain. Gregory Eaton saw Dr. Lenna Gilford  In 2018 for his COPD/emphysema.  There is no change in his prior right lower lobe pulmonary nodules.  Gregory Eaton underwent lower extremity Doppler studies in April 2018, which revealed his known SFA disease.  The right ABI was within normal limits with abnormal waveforms.  The left ABI was not obtainable due to noncompressible vessels.  Bilateral great toe pressures were abnormal.  Gregory Eaton denies any change in his claudication.  Gregory Eaton denies any chest pain and continues to be on dual antiplatelet therapy.  Gregory Eaton was on amlodipine 5 mg, losartan 75 mg, Toprol 50 mg for hypertension.  Losartan was titrated to 100 mg.  Gregory Eaton was on Spiriva and  Singulair therapy for COPD.  Gregory Eaton wason crestor for hyperlipidemia.   Since I  saw him in July 2018 Gregory Eaton has continued to do relatively well but his blood pressure has been elevated.  Gregory Eaton underwent  follow-up  lower extremity Dopplers in April 2019.  His right ABI was slightly increased from 1 year previously and his left ABIs were unchanged.  Gregory Eaton saw [name], [place] in June 2019 and at that time Gregory Eaton recommended that Gregory Eaton switch taking losartan at bedtime but continue with amlodipine 5 mg and Toprol 50 mg in the morning.  In September 2019 Gregory Eaton underwent follow-up carotid studies which  showed 1 to 39% stenoses bilaterally in the internal carotids.  There was nonhemodynamically significant plaque less than 50% in the right common carotid.  The right vertebral artery demonstrated antegrade flow.  The left vertebral artery was not visualized.  Incidentally, Gregory Eaton was noted to have 3 vascular and heterogeneous areas in the left thyroid.    I last saw him in December 2019 at which time Gregory Eaton remained stable without chest pain but his blood pressure  was elevated despite taking losartan 100 mg, amlodipine 5 mg, and Toprol-XL 50 mg.  I further titrated amlodipine to 10 mg.  Gregory Eaton was on rosuvastatin for hyperlipidemia.  Since I last saw him Gregory Eaton was seen by [name], [place] on January 15, 2019.  Gregory Eaton was in need for total hip arthroplasty by Dr. Percell Miller.  Gregory Eaton was advised to hold his aspirin and Plavix for the procedure.  Gregory Eaton recently saw Dr. Loanne Drilling for pulmonary issues and his Anoro was changed to bevespi due to insurance.   Presently, Gregory Eaton feels well.  Gregory Eaton is able to be more active since undergoing his left hip surgery.  Gregory Eaton denies recurrent chest pain.  Gregory Eaton denies recent wheezing.  Gregory Eaton sees Gregory Eaton for primary care.  Gregory Eaton presents for reevaluation.   Past Medical History:  Diagnosis Date  . Adenomatous colon polyp 03/2009   Last colonoscopy by Dr. [name]   . Adenomatous polyp 2010  . Adenomatous polyp of colon 11/03/2010  . Arthritis   . Barrett's esophagus   . CAD (coronary artery disease)   . Complication of anesthesia    has a shortened esophogus due to CA.   Marland Kitchen COPD (chronic obstructive pulmonary disease) (HCC)    Severe emphysema per CT  . Diverticulosis   . Esophageal carcinoma ([place]) 03/2009   T1N1M0  . GERD (gastroesophageal reflux disease)   . Glaucoma   . History of Doppler ultrasound 11/09/2011   03/2014- 50-69% L ICA stenosis;carotid doppler; L bulb/prox ICA 0-49% diameter reduction; L vertebral artery - occlusive ds; L ECA  demonstrates severe amount of fibrous plaque  . History of Doppler ultrasound 11/09/11   LEAs; R ABI - mod art. insuff.; L ABI normal at rest; R SFA - occlusive ds; L SFA - occlusive ds; patent fem-pop graft  . History of echocardiogram 08/27/2009   EF >55%  . History of kidney stones 1965  . History of nuclear stress test 11/24/2011   lexiscan; normal perfusion; low risk scan; non-diagnostic for ischemia  . Hyperlipidemia   . Hypertension   . Left carotid artery stenosis 04/08/2014  . Pulmonary nodule, right 04/08/2014   2.8  mm-incidental finding on CT  . PVD (peripheral vascular disease) ([place])   . Tachyarrhythmia 1999   Status post ablation at [facility]  . Tobacco abuse     Past Surgical History:  Procedure Laterality Date  . BIOPSY  10/24/2017   Procedure: BIOPSY;  Surgeon: [name], MD;  Location: [facility];  Service: Endoscopy;;  esophagus  . COLONOSCOPY  03/17/2009   Dr.Rourk- normal rectum, sigmoid diverticula, some pale sigmoid mucosa with diffuse petechiae. pedunculated polyp at the splenic flexure, remainder of colonic mucosa appeared normal. bx= adenomatous polyp  . COLONOSCOPY  04/19/2003   Dr.Rehman- few diverticu;a at the sigmoid colon, 3 small polyps, one at the transverse colon and 2 at the sigmoid, small external hemorrhoids. bx report not available.   . COLONOSCOPY WITH PROPOFOL N/A 10/24/2017   Dr. [name]: Diverticulosis, 11 mm polyp at the  ileocecal valve, tubular adenoma.  Repeat colonoscopy in 3 years  . CORONARY ARTERY BYPASS GRAFT  1998   [name]  . ESOPHAGECTOMY  2010   [facility] Dr. [name]  . ESOPHAGOGASTRODUODENOSCOPY  11/25/2010   Dr.Rourk- s/p esophagectomy with gastric pull-up, esophageal erosions straddling the surgical anastomosis, salmon colored epithelium coming up a good centimeter to a centimeter and a half above the suture line, islands of salmon colored epithelium in the most poximal residual esophagus, remainder of gastric mucosa appeared normal. bx= swamous &gastric glandular mucosa w/chronic active inflammation  . ESOPHAGOGASTRODUODENOSCOPY  03/17/2009   Dr.Rourk- 4cm segment of salmon-colored epithlium distal esophagus suspicious for barretts esophagus. area of suspicious nodularity w/in this segment bx seperately. small to moderate size hiatal hernia, o/w normal stomach D1 and D2 bx=adenocarcinoma  . ESOPHAGOGASTRODUODENOSCOPY (EGD) WITH PROPOFOL N/A 10/24/2017   Dr. [name]: Remnant of esophagus with anastomosis with stomach at 24 cm from the incisors, 2 cm above the  anastomosis Gregory Eaton was found to have Barrett's but no dysplasia.  Advised for repeat EGD in 3 years  . FEMORAL-POPLITEAL BYPASS GRAFT  1993   occlusive ds in R SFA  . GASTRIC PULL THROUGH  2010   With esophagectomy  . LIPOMA EXCISION  2010  . POLYPECTOMY  10/24/2017   Procedure: POLYPECTOMY;  Surgeon: [name], MD;  Location: [facility];  Service: Endoscopy;;  colon   . TOTAL HIP ARTHROPLASTY Left 01/30/2019   Procedure: TOTAL HIP ARTHROPLASTY ANTERIOR APPROACH;  Surgeon: Renette Butters, MD;  Location: [facility];  Service: Orthopedics;  Laterality: Left;    Allergies  Allergen Reactions  . Altace [Ramipril] Cough    Current Outpatient Medications  Medication Sig Dispense Refill  . acetaminophen (TYLENOL) 500 MG tablet Take 500 mg by mouth daily as needed for headache.    . albuterol (PROVENTIL) (2.5 MG/3ML) 0.083% nebulizer solution Take 3 mLs (2.5 mg total) by nebulization every 6 (six) hours as needed for wheezing or shortness of breath. 75 mL 12  . ALPRAZolam (XANAX) 0.5 MG tablet Take 1 tablet (0.5 mg total) by mouth at bedtime. 30 tablet 0  . amLODipine (NORVASC) 10 MG tablet Take 1 tablet (10 mg total) by mouth daily. 90 tablet 3  . aspirin EC 81 MG tablet Take 1 tablet (81 mg total) by mouth 2 (two) times daily. For DVT prophylaxis for 30 days after surgery. 60 tablet 0  . baclofen (LIORESAL) 10 MG tablet Take 1 tablet (10 mg total) by mouth 3 (three) times daily as needed for muscle spasms. 20 each 0  . brimonidine (ALPHAGAN) 0.2 % ophthalmic solution Place 1 drop into the right eye at bedtime.     . clopidogrel (PLAVIX) 75 MG tablet TAKE ONE (1) TABLET BY MOUTH EVERY DAY 90 tablet 3  . dexlansoprazole (DEXILANT) 60 MG capsule Take 1 capsule (60 mg total) by mouth daily before supper. 30 capsule 11  . diclofenac sodium (VOLTAREN) 1 % GEL Apply 2 g topically 4 (four) times daily. Rub into affected area of foot 2 to 4 times daily (Patient taking differently: Apply 2 g topically  4 (four) times daily as needed (pain). Rub into affected area of foot 2 to 4 times daily) 100 g 2  . Glycopyrrolate-Formoterol (BEVESPI AEROSPHERE) 9-4.8 MCG/ACT AERO Inhale 2 puffs into the lungs 2 (two) times daily. 5.9 g 0  . guaiFENesin (MUCINEX) 600 MG 12 hr tablet Take 1,200 mg by mouth every 12 (twelve) hours as needed (cough).     Marland Kitchen  latanoprost (XALATAN) 0.005 % ophthalmic solution Place 1 drop into both eyes at bedtime.     Marland Kitchen losartan (COZAAR) 100 MG tablet TAKE ONE TABLET BY MOUTH EVERY NIGHT AT BEDTIME 90 tablet 3  . metoprolol succinate (TOPROL-XL) 50 MG 24 hr tablet TAKE ONE (1) TABLET BY MOUTH EVERY DAY 90 tablet 3  . montelukast (SINGULAIR) 10 MG tablet Take 1 tablet (10 mg total) by mouth daily. 90 tablet 3  . Multiple Vitamins-Minerals (CENTRUM SILVER PO) Take 1 tablet by mouth daily.      . nitroGLYCERIN (NITROSTAT) 0.4 MG SL tablet Place 1 tablet (0.4 mg total) under the tongue every 5 (five) minutes as needed for chest pain. 25 tablet 3  . rosuvastatin (CRESTOR) 10 MG tablet TAKE ONE (1) TABLET BY MOUTH EVERY DAY 90 tablet 3   No current facility-administered medications for this visit.    Socially Gregory Eaton is widowed has 2 children. Gregory Eaton quit smoking in 1997.  ROS General: Negative; No fevers, chills, or night sweats;  HEENT: Negative; No changes in vision or hearing, sinus congestion, difficulty swallowing Pulmonary: Positive for  COPD , pulmonary nodule; and recent pneumonia. Cardiovascular: Negative; No chest pain, presyncope, syncope, palpitations Status post femoropopliteal surgery 1993 GI: Positive for GERD; No nausea, vomiting, diarrhea, or abdominal pain GU: Negative; No dysuria, hematuria, or difficulty voiding Musculoskeletal: Negative; no myalgias, joint pain, or weakness Hematologic/Oncology: Negative; no easy bruising, bleeding Endocrine: Negative; no heat/cold intolerance; no diabetes Neuro: Negative; no changes in balance, headaches Skin: Negative; No rashes or  skin lesions Psychiatric: Negative; No behavioral problems, depression Sleep: Negative; No snoring, daytime sleepiness, hypersomnolence, bruxism, restless legs, hypnogognic hallucinations, no cataplexy   PE BP (!) 152/71   Pulse 75   Ht '5\' 8"'$  (1.727 m)   Wt 165 lb (74.8 kg)   SpO2 96%   BMI 25.09 kg/m    Repeat blood pressure by me in the right arm was 140/70 and in the left arm was 136/68  Wt Readings from Last 3 Encounters:  07/23/19 165 lb (74.8 kg)  03/12/19 158 lb 6.4 oz (71.8 kg)  01/30/19 158 lb 15.2 oz (72.1 kg)   General: Alert, oriented, no distress.  Skin: normal turgor, no rashes, warm and dry HEENT: Normocephalic, atraumatic. Pupils equal round and reactive to light; sclera anicteric; extraocular muscles intact;  Nose without nasal septal hypertrophy Mouth/Parynx benign; Mallinpatti scale 3 Neck: No JVD, no carotid bruits; normal carotid upstroke Lungs: clear to ausculatation and percussion; no wheezing or rales Chest wall: without tenderness to palpitation Heart: PMI not displaced, RRR, s1 s2 normal, 1/6 systolic murmur, no diastolic murmur, no rubs, gallops, thrills, or heaves Abdomen: soft, nontender; no hepatosplenomehaly, BS+; abdominal aorta nontender and not dilated by palpation. Back: no CVA tenderness Pulses 2+ Musculoskeletal: full range of motion, normal strength, no joint deformities Extremities: no clubbing cyanosis or edema, Homan's sign negative  Neurologic: grossly nonfocal; Cranial nerves grossly wnl Psychologic: Normal mood and affect   ECG (independently read by me): Sinus rhythm at 75 bpm, right bundle branch block with repolarization changes, borderline first-degree AV block with a PR normal at 202 ms.  QTc interval 455 ms.  December 2019 ECG (independently read by me): Normal sinus rhythm at 72 bpm.  Right bundle branch block with repolarization changes.  No ectopy.  April 2018 ECG (independently read by me): Normal sinus rhythm at 65 bpm.   Right bundle-branch block with repolarization changes.  Borderline first-degree AV block with a PR interval 22  ms.  06/23/2016 ECG (independently read by me): normal sinus rhythm at 86 bpm.  Right bundle branch block with repolarization changes.  Small Q wave inferiorly.  November 2016 ECG (independently read by me):  Normal sinus rhythm with right bundle branch block and repolarization changes. First-degree AV block with PR interval of 210 ms.  March 2016 ECG (independently read by me): Normal sinus rhythm at 64 bpm.  Right bundle branch block with repolarization changes.  First-degree AV block with a PR interval of 228 ms.  September 2015 ECG (independently read by me): Sinus rhythm with first-degree AV block.  PR interval 214 ms.  Right bundle branch block with repolarization changes.  January 2015 ECG (independently read by me): Sinus rhythm 86 beats per minute. PR interval 200 ms.  Prior ECG of July 2014 :Sinus rhythm at 70 beats per minute. First degree AV block. 1 isolated unifocal PVC.  LABS:  BMP Latest Ref Rng & Units 01/22/2019 10/19/2017 06/02/2016  Glucose 70 - 99 mg/dL 113(H) 142(H) 115(H)  BUN 8 - 23 mg/dL '16 13 10  '$ Creatinine 0.61 - 1.24 mg/dL 0.83 0.82 0.85  Sodium 135 - 145 mmol/L 143 139 137  Potassium 3.5 - 5.1 mmol/L 4.8 4.2 4.0  Chloride 98 - 111 mmol/L 110 105 104  CO2 22 - 32 mmol/L 26 21(L) 25  Calcium 8.9 - 10.3 mg/dL 9.5 9.1 8.6(L)    Hepatic Function Latest Ref Rng & Units 05/25/2016 04/07/2014 03/15/2014  Total Protein 6.5 - 8.1 g/dL 6.8 7.7 6.9  Albumin 3.5 - 5.0 g/dL 3.1(L) 4.1 3.7  AST 15 - 41 U/L 14(L) 20 15  ALT 17 - 63 U/L 13(L) 19 15  Alk Phosphatase 38 - 126 U/L 63 89 77  Total Bilirubin 0.3 - 1.2 mg/dL 0.9 0.5 0.5    CBC Latest Ref Rng & Units 01/22/2019 10/19/2017 06/02/2016  WBC 4.0 - 10.5 K/uL 7.9 6.4 14.6(H)  Hemoglobin 13.0 - 17.0 g/dL 14.9 14.6 13.3  Hematocrit 39.0 - 52.0 % 46.7 44.1 40.1  Platelets 150 - 400 K/uL 312 282 531(H)    Lab  Results  Component Value Date   MCV 94.0 01/22/2019   MCV 92.3 10/19/2017   MCV 91.8 06/02/2016   Lab Results  Component Value Date   TSH 1.850 04/07/2014    BNP    Component Value Date/Time   PROBNP 190.6 (H) 04/07/2014 1600    Lipid Panel     Component Value Date/Time   CHOL 96 01/31/2013 0940   TRIG 78 01/31/2013 0940   HDL 37 (L) 01/31/2013 0940   CHOLHDL 2.6 01/31/2013 0940   VLDL 16 01/31/2013 0940   LDLCALC 43 01/31/2013 0940     RADIOLOGY: No results found.  IMPRESSION:  1. Coronary artery disease involving coronary bypass graft of native heart without angina pectoris   2. Essential hypertension   3. PAD (peripheral artery disease) ([place])   4. Hx of CABG   5. Left carotid artery stenosis   6. Hyperlipidemia LDL goal <70     ASSESSMENT AND PLAN: Gregory Eaton is a 74 year old gentleman who has coronary as well as peripheral vascular disease.  Gregory Eaton is s/p CABG revascularization surgery in 1998.  A nuclear perfusion study in 2015 suggested  patency of his grafts and this was without scar or ischemia. His CAD appears stable and Gregory Eaton is not having anginal symptoms on current therapy. His carotid ultrasound  on 04/07/2014 showed bilateral atherosclerotic plaque in the carotid bulb and internal  carotid arteries with focal 60-69% stenosis in the left internal carotid.  Preocclusive narrowing was noted in the left external carotid.  His left vertebral artery was not visualized.  I reviewed his most recent Doppler findings with him.  Gregory Eaton has occlusive disease in his right SFA but apparently continues to do well, status post femoropopliteal bypass surgery.  His 2019 lower extremity Doppler revealed noncompressible arteries with abnormal toe brachial index but on the most recent study the right ABI was slightly increased from 1 year previously.  His most recent carotid studies in September 2020 showed 1 to 39% stenosis in the right ICA, nonhemodynamic significant plaque of less than 50%  in the common carotid artery.  The left carotid had 40 to 59% ICA stenosis.  The right vertebral artery had normal antegrade flow.  The left vertebral artery was occluded.  There was stenosis noted in the left subclavian artery.  On blood pressure evaluation today, there was not a significant blood pressure differential between the left and right arm.  Currently, Gregory Eaton has been on a medical regimen consisting of amlodipine 10 mg, losartan 100 mg, Toprol-XL 50 mg daily for blood pressure control.  Gregory Eaton is not having any wheezing but has rhonchi on his medical regimen of as needed albuterol,bevespi and Singulair.  Gregory Eaton continues to be on rosuvastatin 10 mg daily with target LDL less than 70.  LDL in January 2020 was excellent at 50.  Gregory Eaton will be undergoing follow-up laboratory with Dr. Wende Neighbors.  His GERD is controlled with Dexilant.  Gregory Eaton is now able to be more active following his left hip surgery.  I will see him in 6 months for reevaluation.   Time spent: 25 minutes Troy Sine, MD, [place] [facility]  07/25/2019 12:57 PM

## 2019-07-23 NOTE — Patient Instructions (Signed)
Medication Instructions:  NO CHANGES *If you need a refill on your cardiac medications before your next appointment, please call your pharmacy*  Lab Work: NONE NEEDED  Testing/Procedures: NONE NEEDED  Follow-Up: At [facility], you and your health needs are our priority.  As part of our continuing mission to provide you with exceptional heart care, we have created designated Provider Care Teams.  These Care Teams include your primary Cardiologist (physician) and Advanced Practice Providers (APPs -  Physician Assistants and Nurse Practitioners) who all work together to provide you with the care you need, when you need it.  Your next appointment:   6 month(s)  The format for your next appointment:   In Person  Provider:   Shelva Majestic, MD

## 2019-07-25 ENCOUNTER — Encounter: Payer: Self-pay | Admitting: Cardiovascular Disease

## 2019-08-28 ENCOUNTER — Other Ambulatory Visit: Payer: Self-pay | Admitting: Gastroenterology

## 2019-08-29 NOTE — Telephone Encounter (Signed)
Please arrange routine office visit. I have provided refills. History of Barrett's.

## 2019-08-30 ENCOUNTER — Encounter: Payer: Self-pay | Admitting: Internal Medicine

## 2019-08-30 NOTE — Telephone Encounter (Signed)
PATIENT SCHEDULED AND LETTER SENT  °

## 2019-09-10 DIAGNOSIS — M5481 Occipital neuralgia: Secondary | ICD-10-CM | POA: Diagnosis not present

## 2019-09-10 DIAGNOSIS — I10 Essential (primary) hypertension: Secondary | ICD-10-CM | POA: Diagnosis not present

## 2019-09-18 ENCOUNTER — Ambulatory Visit: Payer: Medicare Other | Attending: Internal Medicine

## 2019-09-24 NOTE — Progress Notes (Signed)
Referring Provider: Celene Squibb, MD Primary Care Physician:  Celene Squibb, MD Primary GI: Dr. [name]   Chief Complaint  Patient presents with  . Barrett's    HPI:   Gregory Eaton is a 74 y.o. male presenting today with a history of remote esophageal cancer in 2010, undergoing surgical resection and considered cured. History of Barrett's with EGD last in 2019 and due for surveillance in 2022. Surveillance colonoscopy also due in 2022.   Has been taking Dexilant as needed. GERD controlled. Dexilant expensive, 100$ per month. Protonix and Prilosec without much help.   If laying down flat, can't sleep. Sleeps with bed up 10 inches. Eating between 4-5 pm in evening. Once in awhile will wake up strangled but not every day. Can eat whatever without much issues. Will take Benefiber or Miralax as needed.    Past Medical History:  Diagnosis Date  . Adenomatous colon polyp 03/2009   Last colonoscopy by Dr. [name]   . Adenomatous polyp 2010  . Adenomatous polyp of colon 11/03/2010  . Arthritis   . Barrett's esophagus   . CAD (coronary artery disease)   . Complication of anesthesia    has a shortened esophogus due to CA.   Marland Kitchen COPD (chronic obstructive pulmonary disease) (HCC)    Severe emphysema per CT  . Diverticulosis   . Esophageal carcinoma ([place]) 03/2009   T1N1M0  . GERD (gastroesophageal reflux disease)   . Glaucoma   . History of Doppler ultrasound 11/09/2011   03/2014- 50-69% L ICA stenosis;carotid doppler; L bulb/prox ICA 0-49% diameter reduction; L vertebral artery - occlusive ds; L ECA  demonstrates severe amount of fibrous plaque  . History of Doppler ultrasound 11/09/11   LEAs; R ABI - mod art. insuff.; L ABI normal at rest; R SFA - occlusive ds; L SFA - occlusive ds; patent fem-pop graft  . History of echocardiogram 08/27/2009   EF >55%  . History of kidney stones 1965  . History of nuclear stress test 11/24/2011   lexiscan; normal perfusion; low risk scan; non-diagnostic  for ischemia  . Hyperlipidemia   . Hypertension   . Left carotid artery stenosis 04/08/2014  . Pulmonary nodule, right 04/08/2014   2.8 mm-incidental finding on CT  . PVD (peripheral vascular disease) ([place])   . Tachyarrhythmia 1999   Status post ablation at [facility]  . Tobacco abuse     Past Surgical History:  Procedure Laterality Date  . BIOPSY  10/24/2017   Procedure: BIOPSY;  Surgeon: [name], MD;  [facility];  Service: Endoscopy;;  esophagus  . COLONOSCOPY  03/17/2009   Dr.Rourk- normal rectum, sigmoid diverticula, some pale sigmoid mucosa with diffuse petechiae. pedunculated polyp at the splenic flexure, remainder of colonic mucosa appeared normal. bx= adenomatous polyp  . COLONOSCOPY  04/19/2003   Dr.Rehman- few diverticu;a at the sigmoid colon, 3 small polyps, one at the transverse colon and 2 at the sigmoid, small external hemorrhoids. bx report not available.   . COLONOSCOPY WITH PROPOFOL N/A 10/24/2017   Dr. [name]: Diverticulosis, 11 mm polyp at the ileocecal valve, tubular adenoma.  Repeat colonoscopy in 3 years  . CORONARY ARTERY BYPASS GRAFT  1998   [name]  . ESOPHAGECTOMY  2010   [facility] Dr. [name]  . ESOPHAGOGASTRODUODENOSCOPY  11/25/2010   Dr.Rourk- s/p esophagectomy with gastric pull-up, esophageal erosions straddling the surgical anastomosis, salmon colored epithelium coming up a good centimeter to a centimeter and  a half above the suture line, islands of salmon colored epithelium in the most poximal residual esophagus, remainder of gastric mucosa appeared normal. bx= swamous &gastric glandular mucosa w/chronic active inflammation  . ESOPHAGOGASTRODUODENOSCOPY  03/17/2009   Dr.Rourk- 4cm segment of salmon-colored epithlium distal esophagus suspicious for barretts esophagus. area of suspicious nodularity w/in this segment bx seperately. small to moderate size hiatal hernia, o/w normal stomach D1 and D2 bx=adenocarcinoma  . ESOPHAGOGASTRODUODENOSCOPY  (EGD) WITH PROPOFOL N/A 10/24/2017   Dr. [name]: Remnant of esophagus with anastomosis with stomach at 24 cm from the incisors, 2 cm above the anastomosis he was found to have Barrett's but no dysplasia.  Advised for repeat EGD in 3 years  . FEMORAL-POPLITEAL BYPASS GRAFT  1993   occlusive ds in R SFA  . GASTRIC PULL THROUGH  2010   With esophagectomy  . LIPOMA EXCISION  2010  . POLYPECTOMY  10/24/2017   Procedure: POLYPECTOMY;  Surgeon: [name], MD;  [facility];  Service: Endoscopy;;  colon   . TOTAL HIP ARTHROPLASTY Left 01/30/2019   Procedure: TOTAL HIP ARTHROPLASTY ANTERIOR APPROACH;  Surgeon: Renette Butters, MD;  Location: [facility];  Service: Orthopedics;  Laterality: Left;    Current Outpatient Medications  Medication Sig Dispense Refill  . acetaminophen (TYLENOL) 500 MG tablet Take 500 mg by mouth daily as needed for headache.    . albuterol (PROVENTIL) (2.5 MG/3ML) 0.083% nebulizer solution Take 3 mLs (2.5 mg total) by nebulization every 6 (six) hours as needed for wheezing or shortness of breath. 75 mL 12  . ALPRAZolam (XANAX) 0.5 MG tablet Take 1 tablet (0.5 mg total) by mouth at bedtime. 30 tablet 0  . amLODipine (NORVASC) 10 MG tablet Take 1 tablet (10 mg total) by mouth daily. 90 tablet 3  . aspirin EC 81 MG tablet Take 1 tablet (81 mg total) by mouth 2 (two) times daily. For DVT prophylaxis for 30 days after surgery. (Patient taking differently: Take 81 mg by mouth daily. ) 60 tablet 0  . baclofen (LIORESAL) 10 MG tablet Take 1 tablet (10 mg total) by mouth 3 (three) times daily as needed for muscle spasms. 20 each 0  . brimonidine (ALPHAGAN) 0.2 % ophthalmic solution Place 1 drop into the right eye at bedtime.     . clopidogrel (PLAVIX) 75 MG tablet TAKE ONE (1) TABLET BY MOUTH EVERY DAY 90 tablet 3  . DEXILANT 60 MG capsule TAKE ONE CAPSULE (60MG  TOTAL) BY MOUTH DAILY BEFORE SUPPER (Patient taking differently: Take 60 mg by mouth as needed. ) 30 capsule 5  .  diclofenac sodium (VOLTAREN) 1 % GEL Apply 2 g topically 4 (four) times daily. Rub into affected area of foot 2 to 4 times daily (Patient taking differently: Apply 2 g topically 4 (four) times daily as needed (pain). Rub into affected area of foot 2 to 4 times daily) 100 g 2  . Glycopyrrolate-Formoterol (BEVESPI AEROSPHERE) 9-4.8 MCG/ACT AERO Inhale 2 puffs into the lungs 2 (two) times daily. 5.9 g 0  . guaiFENesin (MUCINEX) 600 MG 12 hr tablet Take 1,200 mg by mouth every 12 (twelve) hours as needed (cough).     . latanoprost (XALATAN) 0.005 % ophthalmic solution Place 1 drop into both eyes at bedtime.     Marland Kitchen losartan (COZAAR) 100 MG tablet TAKE ONE TABLET BY MOUTH EVERY NIGHT AT BEDTIME 90 tablet 3  . metoprolol succinate (TOPROL-XL) 50 MG 24 hr tablet TAKE ONE (1) TABLET BY MOUTH EVERY  DAY 90 tablet 3  . montelukast (SINGULAIR) 10 MG tablet Take 1 tablet (10 mg total) by mouth daily. 90 tablet 3  . Multiple Vitamins-Minerals (CENTRUM SILVER PO) Take 1 tablet by mouth daily.      . nitroGLYCERIN (NITROSTAT) 0.4 MG SL tablet Place 1 tablet (0.4 mg total) under the tongue every 5 (five) minutes as needed for chest pain. 25 tablet 3  . rosuvastatin (CRESTOR) 10 MG tablet TAKE ONE (1) TABLET BY MOUTH EVERY DAY 90 tablet 3   No current facility-administered medications for this visit.    Allergies as of 09/26/2019 - Review Complete 09/26/2019  Allergen Reaction Noted  . Altace [ramipril] Cough 04/07/2014    Family History  Problem Relation Age of Onset  . GER disease Mother   . Coronary artery disease Brother   . Congenital heart disease Sister   . Colon cancer Neg Hx     Social History   Socioeconomic History  . Marital status: Married    Spouse name: Not on file  . Number of children: 2  . Years of education: Not on file  . Highest education level: Not on file  Occupational History  . Occupation: retired    Fish farm manager: RETIRED    Comment: truck Geophysicist/field seismologist  Tobacco Use  . Smoking  status: Former Smoker    Packs/day: 2.00    Years: 40.00    Pack years: 80.00    Types: Cigarettes    Start date: 59    Quit date: 07/20/1995    Years since quitting: 24.2  . Smokeless tobacco: Never Used  Substance and Sexual Activity  . Alcohol use: No    Alcohol/week: 0.0 standard drinks  . Drug use: No  . Sexual activity: Yes    Partners: Female    Birth control/protection: Condom    Comment: friend  Other Topics Concern  . Not on file  Social History Narrative  . Not on file   Social Determinants of Health   Financial Resource Strain:   . Difficulty of Paying Living Expenses: Not on file  Food Insecurity:   . Worried About Charity fundraiser in the Last Year: Not on file  . Ran Out of Food in the Last Year: Not on file  Transportation Needs:   . Lack of Transportation (Medical): Not on file  . Lack of Transportation (Non-Medical): Not on file  Physical Activity:   . Days of Exercise per Week: Not on file  . Minutes of Exercise per Session: Not on file  Stress:   . Feeling of Stress : Not on file  Social Connections:   . Frequency of Communication with Friends and Family: Not on file  . Frequency of Social Gatherings with Friends and Family: Not on file  . Attends Religious Services: Not on file  . Active Member of Clubs or Organizations: Not on file  . Attends Archivist Meetings: Not on file  . Marital Status: Not on file    Review of Systems: Gen: Denies fever, chills, anorexia. Denies fatigue, weakness, weight loss.  CV: Denies chest pain, palpitations, syncope, peripheral edema, and claudication. Resp: Denies dyspnea at rest, cough, wheezing, coughing up blood, and pleurisy. GI: see HPI Derm: Denies rash, itching, dry skin Psych: Denies depression, anxiety, memory loss, confusion. No homicidal or suicidal ideation.  Heme: Denies bruising, bleeding, and enlarged lymph nodes.  Physical Exam: BP (!) 145/67   Pulse 85   Temp (!) 97.5 F (36.4  C) (Temporal)  Ht 5\' 8"  (1.727 m)   Wt 165 lb (74.8 kg)   BMI 25.09 kg/m  General:   Alert and oriented. No distress noted. Pleasant and cooperative.  Head:  Normocephalic and atraumatic. Eyes:  Conjuctiva clear without scleral icterus. Lungs: clear bilaterally Cardiac: S1 S2 present without murmurs  Abdomen:  +BS, soft, non-tender and non-distended. No rebound or guarding. No HSM or masses noted. Msk:  Symmetrical without gross deformities. Normal posture. Extremities:  Without edema. Neurologic:  Alert and  oriented x4 Psych:  Alert and cooperative. Normal mood and affect.  ASSESSMENT: Gregory Eaton is a 74 y.o. male presenting today with history of Barrett's esophagus, multiple adenomas in past with surveillance due in 2022, doing well from a GI standpoint. As of note, remote history of esophageal cancer in 2010 s/p surgical resection.  He reports not taking Dexilant every day, stating he uses only as needed. Discussed need for daily PPI in setting of known Barrett's. Unfortunately, this has been difficult to obtain monthly as the co-pay is 100$. We will provide assistance forms and samples for now. Prior PPIs including Protonix and Prilosec without good control of symptoms.    PLAN:  Dexilant samples provided  Patient assistance forms provided  Take PPI daily  EGD and colonoscopy in 2022  Return in 1 year  Annitta Needs, PhD, [facility]

## 2019-09-26 ENCOUNTER — Ambulatory Visit: Payer: Medicare Other | Admitting: Gastroenterology

## 2019-09-26 ENCOUNTER — Other Ambulatory Visit: Payer: Self-pay

## 2019-09-26 ENCOUNTER — Encounter: Payer: Self-pay | Admitting: Gastroenterology

## 2019-09-26 VITALS — BP 145/67 | HR 85 | Temp 97.5°F | Ht 68.0 in | Wt 165.0 lb

## 2019-09-26 DIAGNOSIS — Z86010 Personal history of colonic polyps: Secondary | ICD-10-CM | POA: Diagnosis not present

## 2019-09-26 DIAGNOSIS — K2270 Barrett's esophagus without dysplasia: Secondary | ICD-10-CM | POA: Diagnosis not present

## 2019-09-26 NOTE — Patient Instructions (Signed)
We will try to help with assistance for Dexilant cost.  It is important to keep taking Dexilant (or similar medication in this class) every day.   We will see you back in 1 year! You will be due for an endoscopy and colonoscopy in April 2022.  It was a pleasure to see you today. I want to create trusting relationships with patients to provide genuine, compassionate, and quality care. I value your feedback. If you receive a survey regarding your visit,  I greatly appreciate you taking time to fill this out.   Annitta Needs, PhD, ANP-BC [facility]

## 2019-10-22 ENCOUNTER — Other Ambulatory Visit: Payer: Self-pay

## 2019-10-22 ENCOUNTER — Encounter: Payer: Self-pay | Admitting: Pulmonary Disease

## 2019-10-22 ENCOUNTER — Telehealth: Payer: Self-pay | Admitting: Pulmonary Disease

## 2019-10-22 ENCOUNTER — Ambulatory Visit: Payer: Medicare Other | Admitting: Pulmonary Disease

## 2019-10-22 VITALS — BP 142/64 | HR 71 | Temp 97.9°F | Ht 68.0 in | Wt 164.8 lb

## 2019-10-22 DIAGNOSIS — J449 Chronic obstructive pulmonary disease, unspecified: Secondary | ICD-10-CM

## 2019-10-22 MED ORDER — ALBUTEROL SULFATE (2.5 MG/3ML) 0.083% IN NEBU: 6.0000 mg | INHALATION_SOLUTION | Freq: Four times a day (QID) | RESPIRATORY_TRACT | 6 refills | Status: DC | PRN

## 2019-10-22 MED ORDER — BEVESPI AEROSPHERE 9-4.8 MCG/ACT IN AERO: 2.0000 | INHALATION_SPRAY | Freq: Two times a day (BID) | RESPIRATORY_TRACT | 6 refills | Status: DC

## 2019-10-22 MED ORDER — ALBUTEROL SULFATE (2.5 MG/3ML) 0.083% IN NEBU: 2.5000 mg | INHALATION_SOLUTION | Freq: Four times a day (QID) | RESPIRATORY_TRACT | 6 refills | Status: DC | PRN

## 2019-10-22 MED ORDER — MONTELUKAST SODIUM 10 MG PO TABS: 10.0000 mg | ORAL_TABLET | Freq: Every day | ORAL | 6 refills | Status: DC

## 2019-10-22 NOTE — Telephone Encounter (Signed)
Spoke with the pharmacist at [facility]. They had some questions about the pt's albuterol rx . These have been answered and the prescription has been fixed. Nothing further was needed.

## 2019-10-22 NOTE — Progress Notes (Signed)
Subjective:   PATIENT ID: Gregory Eaton GENDER: male DOB: [birth year], MRN: [id number]   HPI  Chief Complaint  Patient presents with  . Follow-up    Reason for Visit: Follow-up   [name] is a 74 year old male former smoker with COPD (FEV1 59%), status post CABG, hypertension, PVD and history of esophageal cancer status post esophagectomy and gastric pull-through in 2010 who presents for follow-up.  Since our last visit on 03/12/19, he reports his symptoms are well-controlled on Bevespi. He has a daily morning productive cough that he attributes to his reflux. Denies hemoptysis. Denies shortness of breath and wheezing except with strenuous activity such as walking up a steep incline. He rarely uses his albuterol inhaler but does admit maybe if he did, he should use it 2-3 times a month. He prefers to rest and wait out his symptoms and will recover without medications. He feels he is very active and able to do yardwork without issue. Denies any exacerbations requiring antibiotics or steroids since our last visit.  Social History: 60-pack-year smoking history. Quit in 1997 Previous employed as a Administrator, previously did Dealer work  Programme researcher, broadcasting/film/video exposures: Hx of Architect x 2 years  I have personally reviewed patient's past medical/family/social history/allergies/current medications.  Past Medical History:  Diagnosis Date  . Adenomatous colon polyp 03/2009   Last colonoscopy by Dr. Gala Eaton   . Adenomatous polyp 2010  . Adenomatous polyp of colon 11/03/2010  . Arthritis   . Barrett's esophagus   . CAD (coronary artery disease)   . Complication of anesthesia    has a shortened esophogus due to CA.   Marland Kitchen COPD (chronic obstructive pulmonary disease) (HCC)    Severe emphysema per CT  . Diverticulosis   . Esophageal carcinoma ([place]) 03/2009   T1N1M0  . GERD (gastroesophageal reflux disease)   . Glaucoma   . History of Doppler ultrasound 11/09/2011   03/2014- 50-69% L  ICA stenosis;carotid doppler; L bulb/prox ICA 0-49% diameter reduction; L vertebral artery - occlusive ds; L ECA  demonstrates severe amount of fibrous plaque  . History of Doppler ultrasound 11/09/11   LEAs; R ABI - mod art. insuff.; L ABI normal at rest; R SFA - occlusive ds; L SFA - occlusive ds; patent fem-pop graft  . History of echocardiogram 08/27/2009   EF >55%  . History of kidney stones 1965  . History of nuclear stress test 11/24/2011   lexiscan; normal perfusion; low risk scan; non-diagnostic for ischemia  . Hyperlipidemia   . Hypertension   . Left carotid artery stenosis 04/08/2014  . Pulmonary nodule, right 04/08/2014   2.8 mm-incidental finding on CT  . PVD (peripheral vascular disease) ([place])   . Tachyarrhythmia 1999   Status post ablation at [facility]  . Tobacco abuse     Outpatient Medications Prior to Visit  Medication Sig Dispense Refill  . acetaminophen (TYLENOL) 500 MG tablet Take 500 mg by mouth daily as needed for headache.    . albuterol (PROVENTIL) (2.5 MG/3ML) 0.083% nebulizer solution Take 3 mLs (2.5 mg total) by nebulization every 6 (six) hours as needed for wheezing or shortness of breath. 75 mL 12  . ALPRAZolam (XANAX) 0.5 MG tablet Take 1 tablet (0.5 mg total) by mouth at bedtime. 30 tablet 0  . amLODipine (NORVASC) 10 MG tablet Take 1 tablet (10 mg total) by mouth daily. 90 tablet 3  . aspirin EC 81 MG tablet Take 1 tablet (81 mg total) by mouth 2 (  two) times daily. For DVT prophylaxis for 30 days after surgery. (Patient taking differently: Take 81 mg by mouth daily. ) 60 tablet 0  . baclofen (LIORESAL) 10 MG tablet Take 1 tablet (10 mg total) by mouth 3 (three) times daily as needed for muscle spasms. 20 each 0  . brimonidine (ALPHAGAN) 0.2 % ophthalmic solution Place 1 drop into the right eye at bedtime.     . clopidogrel (PLAVIX) 75 MG tablet TAKE ONE (1) TABLET BY MOUTH EVERY DAY 90 tablet 3  . DEXILANT 60 MG capsule TAKE ONE CAPSULE (60MG  TOTAL) BY MOUTH DAILY  BEFORE SUPPER (Patient taking differently: Take 60 mg by mouth as needed. ) 30 capsule 5  . diclofenac sodium (VOLTAREN) 1 % GEL Apply 2 g topically 4 (four) times daily. Rub into affected area of foot 2 to 4 times daily (Patient taking differently: Apply 2 g topically 4 (four) times daily as needed (pain). Rub into affected area of foot 2 to 4 times daily) 100 g 2  . Glycopyrrolate-Formoterol (BEVESPI AEROSPHERE) 9-4.8 MCG/ACT AERO Inhale 2 puffs into the lungs 2 (two) times daily. 5.9 g 0  . guaiFENesin (MUCINEX) 600 MG 12 hr tablet Take 1,200 mg by mouth every 12 (twelve) hours as needed (cough).     . latanoprost (XALATAN) 0.005 % ophthalmic solution Place 1 drop into both eyes at bedtime.     Marland Kitchen losartan (COZAAR) 100 MG tablet TAKE ONE TABLET BY MOUTH EVERY NIGHT AT BEDTIME 90 tablet 3  . metoprolol succinate (TOPROL-XL) 50 MG 24 hr tablet TAKE ONE (1) TABLET BY MOUTH EVERY DAY 90 tablet 3  . montelukast (SINGULAIR) 10 MG tablet Take 1 tablet (10 mg total) by mouth daily. 90 tablet 3  . Multiple Vitamins-Minerals (CENTRUM SILVER PO) Take 1 tablet by mouth daily.      . nitroGLYCERIN (NITROSTAT) 0.4 MG SL tablet Place 1 tablet (0.4 mg total) under the tongue every 5 (five) minutes as needed for chest pain. 25 tablet 3  . rosuvastatin (CRESTOR) 10 MG tablet TAKE ONE (1) TABLET BY MOUTH EVERY DAY 90 tablet 3   No facility-administered medications prior to visit.    Review of Systems  Constitutional: Negative for chills, diaphoresis, fever, malaise/fatigue and weight loss.  HENT: Negative for congestion.   Respiratory: Positive for cough, sputum production and shortness of breath. Negative for hemoptysis and wheezing.   Cardiovascular: Negative for chest pain, palpitations and leg swelling.     Objective:   Vitals:   10/22/19 0905  BP: (!) 142/64  Pulse: 71  Temp: 97.9 F (36.6 C)  TempSrc: Temporal  SpO2: 96%  Weight: 164 lb 12.8 oz (74.8 kg)  Height: 5\' 8"  (1.727 m)       Physical Exam: General: Well-appearing, no acute distress HENT: [place], AT Eyes: EOMI, no scleral icterus Respiratory: Clear to auscultation bilaterally.  No crackles, wheezing or rales Cardiovascular: RRR, -M/R/G, no JVD GI: BS+, soft, nontender Extremities:-Edema,-tenderness Neuro: AAO x4, CNII-XII grossly intact Skin: Intact, no rashes or bruising Psych: Normal mood, normal affect  Data Reviewed:  Imaging: CT Chest 11/22/17 - Centrilobular and paraseptal emphysema. 20mm nodule in the RLL, unchanged since 2015  PFT: 03/23/2016 FVC 4.1 L (101 %) FEV1 1.8 (59 %) Ratio 58  Interpretation: Moderately severe obstructive defect.  Normal vital capacity.    Assessment & Plan:   Discussion: 74 year old male with moderately severe COPD, hx esophageal CA s/p esophagectomy, hx CABG and PVD on DAPT and stable RLL  who presents for follow-up.   Moderately severe COPD (FEV1 59%): GOLD Class B, well-controlled --REFILL Bevespi. Take as instructed --REFILL montelukast 10 mg daily --REFILL Albuterol as needed for shortness of breath  RLL lung nodule: Stable 34mm nodule, unchanged since 2015 No further imaging indicated  Health Maintenance Immunization History  Administered Date(s) Administered  . Influenza Inj Mdck Quad Pf 04/17/2016  . Influenza, High Dose Seasonal PF 05/09/2017, 04/21/2018  . Influenza,inj,Quad PF,6+ Mos 04/19/2015, 04/17/2016  . Influenza-Unspecified 05/29/2010, 05/01/2014  . Pneumococcal Conjugate-13 04/01/2014  . Pneumococcal Polysaccharide-23 04/17/2016  . Td 05/17/2012  UTD on COVID vaccinations 2021  CT Lung Screen - Not qualified  No orders of the defined types were placed in this encounter.  Meds ordered this encounter  Medications  . montelukast (SINGULAIR) 10 MG tablet    Sig: Take 1 tablet (10 mg total) by mouth daily.    Dispense:  90 tablet    Refill:  6  . Glycopyrrolate-Formoterol (BEVESPI AEROSPHERE) 9-4.8 MCG/ACT AERO    Sig: Inhale 2 puffs into  the lungs 2 (two) times daily.    Dispense:  5.9 g    Refill:  6    Order Specific Question:   Lot Number?    Answer:   [id number]    Order Specific Question:   Expiration Date?    Answer:   12/18/2019    Order Specific Question:   Manufacturer?    Answer:   [employer] [[id number]]    Order Specific Question:   Quantity    Answer:   1  . albuterol (PROVENTIL) (2.5 MG/3ML) 0.083% nebulizer solution    Sig: Take 7.2 mLs (6 mg total) by nebulization every 6 (six) hours as needed for wheezing or shortness of breath.    Dispense:  75 mL    Refill:  6   Return in about 6 months (around 04/22/2020).   [place], MD [facility] 10/22/2019 8:43 AM  Office Number [phone]

## 2019-10-22 NOTE — Patient Instructions (Signed)
Moderately severe COPD (FEV1 59%): GOLD Class B, well-controlled --REFILL Bevespi. Take as instructed --CONTINUE Albuterol as needed for shortness of breath  Follow-up in 6 months

## 2019-10-29 DIAGNOSIS — M9903 Segmental and somatic dysfunction of lumbar region: Secondary | ICD-10-CM | POA: Diagnosis not present

## 2019-10-29 DIAGNOSIS — E785 Hyperlipidemia, unspecified: Secondary | ICD-10-CM | POA: Diagnosis not present

## 2019-10-29 DIAGNOSIS — B3784 Candidal otitis externa: Secondary | ICD-10-CM | POA: Diagnosis not present

## 2019-10-29 DIAGNOSIS — H9201 Otalgia, right ear: Secondary | ICD-10-CM | POA: Diagnosis not present

## 2019-10-29 DIAGNOSIS — R7301 Impaired fasting glucose: Secondary | ICD-10-CM | POA: Diagnosis not present

## 2019-10-29 DIAGNOSIS — R7303 Prediabetes: Secondary | ICD-10-CM | POA: Diagnosis not present

## 2019-10-29 DIAGNOSIS — E782 Mixed hyperlipidemia: Secondary | ICD-10-CM | POA: Diagnosis not present

## 2019-10-29 DIAGNOSIS — M545 Low back pain: Secondary | ICD-10-CM | POA: Diagnosis not present

## 2019-10-29 DIAGNOSIS — M9905 Segmental and somatic dysfunction of pelvic region: Secondary | ICD-10-CM | POA: Diagnosis not present

## 2019-10-29 DIAGNOSIS — M9902 Segmental and somatic dysfunction of thoracic region: Secondary | ICD-10-CM | POA: Diagnosis not present

## 2019-11-02 DIAGNOSIS — J449 Chronic obstructive pulmonary disease, unspecified: Secondary | ICD-10-CM | POA: Diagnosis not present

## 2019-11-02 DIAGNOSIS — M9902 Segmental and somatic dysfunction of thoracic region: Secondary | ICD-10-CM | POA: Diagnosis not present

## 2019-11-02 DIAGNOSIS — M9905 Segmental and somatic dysfunction of pelvic region: Secondary | ICD-10-CM | POA: Diagnosis not present

## 2019-11-02 DIAGNOSIS — I2510 Atherosclerotic heart disease of native coronary artery without angina pectoris: Secondary | ICD-10-CM | POA: Diagnosis not present

## 2019-11-02 DIAGNOSIS — Z0001 Encounter for general adult medical examination with abnormal findings: Secondary | ICD-10-CM | POA: Diagnosis not present

## 2019-11-02 DIAGNOSIS — M9903 Segmental and somatic dysfunction of lumbar region: Secondary | ICD-10-CM | POA: Diagnosis not present

## 2019-11-02 DIAGNOSIS — M545 Low back pain: Secondary | ICD-10-CM | POA: Diagnosis not present

## 2019-11-02 DIAGNOSIS — E782 Mixed hyperlipidemia: Secondary | ICD-10-CM | POA: Diagnosis not present

## 2019-11-02 DIAGNOSIS — I10 Essential (primary) hypertension: Secondary | ICD-10-CM | POA: Diagnosis not present

## 2019-11-05 DIAGNOSIS — M545 Low back pain: Secondary | ICD-10-CM | POA: Diagnosis not present

## 2019-11-05 DIAGNOSIS — M9902 Segmental and somatic dysfunction of thoracic region: Secondary | ICD-10-CM | POA: Diagnosis not present

## 2019-11-05 DIAGNOSIS — M9905 Segmental and somatic dysfunction of pelvic region: Secondary | ICD-10-CM | POA: Diagnosis not present

## 2019-11-05 DIAGNOSIS — M9903 Segmental and somatic dysfunction of lumbar region: Secondary | ICD-10-CM | POA: Diagnosis not present

## 2019-11-07 DIAGNOSIS — M9902 Segmental and somatic dysfunction of thoracic region: Secondary | ICD-10-CM | POA: Diagnosis not present

## 2019-11-07 DIAGNOSIS — M9905 Segmental and somatic dysfunction of pelvic region: Secondary | ICD-10-CM | POA: Diagnosis not present

## 2019-11-07 DIAGNOSIS — M545 Low back pain: Secondary | ICD-10-CM | POA: Diagnosis not present

## 2019-11-07 DIAGNOSIS — M9903 Segmental and somatic dysfunction of lumbar region: Secondary | ICD-10-CM | POA: Diagnosis not present

## 2019-11-12 DIAGNOSIS — M9903 Segmental and somatic dysfunction of lumbar region: Secondary | ICD-10-CM | POA: Diagnosis not present

## 2019-11-12 DIAGNOSIS — M9902 Segmental and somatic dysfunction of thoracic region: Secondary | ICD-10-CM | POA: Diagnosis not present

## 2019-11-12 DIAGNOSIS — M9905 Segmental and somatic dysfunction of pelvic region: Secondary | ICD-10-CM | POA: Diagnosis not present

## 2019-11-12 DIAGNOSIS — M545 Low back pain: Secondary | ICD-10-CM | POA: Diagnosis not present

## 2019-11-14 DIAGNOSIS — M9903 Segmental and somatic dysfunction of lumbar region: Secondary | ICD-10-CM | POA: Diagnosis not present

## 2019-11-14 DIAGNOSIS — M9902 Segmental and somatic dysfunction of thoracic region: Secondary | ICD-10-CM | POA: Diagnosis not present

## 2019-11-14 DIAGNOSIS — M545 Low back pain: Secondary | ICD-10-CM | POA: Diagnosis not present

## 2019-11-14 DIAGNOSIS — M9905 Segmental and somatic dysfunction of pelvic region: Secondary | ICD-10-CM | POA: Diagnosis not present

## 2019-11-19 DIAGNOSIS — M9902 Segmental and somatic dysfunction of thoracic region: Secondary | ICD-10-CM | POA: Diagnosis not present

## 2019-11-19 DIAGNOSIS — M9905 Segmental and somatic dysfunction of pelvic region: Secondary | ICD-10-CM | POA: Diagnosis not present

## 2019-11-19 DIAGNOSIS — M9903 Segmental and somatic dysfunction of lumbar region: Secondary | ICD-10-CM | POA: Diagnosis not present

## 2019-11-19 DIAGNOSIS — M545 Low back pain: Secondary | ICD-10-CM | POA: Diagnosis not present

## 2019-11-26 DIAGNOSIS — M9903 Segmental and somatic dysfunction of lumbar region: Secondary | ICD-10-CM | POA: Diagnosis not present

## 2019-11-26 DIAGNOSIS — M545 Low back pain: Secondary | ICD-10-CM | POA: Diagnosis not present

## 2019-11-26 DIAGNOSIS — M9905 Segmental and somatic dysfunction of pelvic region: Secondary | ICD-10-CM | POA: Diagnosis not present

## 2019-11-26 DIAGNOSIS — M25552 Pain in left hip: Secondary | ICD-10-CM | POA: Diagnosis not present

## 2019-11-26 DIAGNOSIS — M9902 Segmental and somatic dysfunction of thoracic region: Secondary | ICD-10-CM | POA: Diagnosis not present

## 2019-11-29 DIAGNOSIS — K219 Gastro-esophageal reflux disease without esophagitis: Secondary | ICD-10-CM | POA: Diagnosis not present

## 2019-11-29 DIAGNOSIS — I2510 Atherosclerotic heart disease of native coronary artery without angina pectoris: Secondary | ICD-10-CM | POA: Diagnosis not present

## 2019-11-29 DIAGNOSIS — E785 Hyperlipidemia, unspecified: Secondary | ICD-10-CM | POA: Diagnosis not present

## 2019-11-29 DIAGNOSIS — E782 Mixed hyperlipidemia: Secondary | ICD-10-CM | POA: Diagnosis not present

## 2019-11-29 DIAGNOSIS — I10 Essential (primary) hypertension: Secondary | ICD-10-CM | POA: Diagnosis not present

## 2019-12-03 DIAGNOSIS — M9902 Segmental and somatic dysfunction of thoracic region: Secondary | ICD-10-CM | POA: Diagnosis not present

## 2019-12-03 DIAGNOSIS — M9903 Segmental and somatic dysfunction of lumbar region: Secondary | ICD-10-CM | POA: Diagnosis not present

## 2019-12-03 DIAGNOSIS — M545 Low back pain: Secondary | ICD-10-CM | POA: Diagnosis not present

## 2019-12-03 DIAGNOSIS — M9905 Segmental and somatic dysfunction of pelvic region: Secondary | ICD-10-CM | POA: Diagnosis not present

## 2019-12-18 DIAGNOSIS — K219 Gastro-esophageal reflux disease without esophagitis: Secondary | ICD-10-CM | POA: Diagnosis not present

## 2019-12-18 DIAGNOSIS — E782 Mixed hyperlipidemia: Secondary | ICD-10-CM | POA: Diagnosis not present

## 2019-12-18 DIAGNOSIS — E785 Hyperlipidemia, unspecified: Secondary | ICD-10-CM | POA: Diagnosis not present

## 2019-12-18 DIAGNOSIS — I10 Essential (primary) hypertension: Secondary | ICD-10-CM | POA: Diagnosis not present

## 2019-12-18 DIAGNOSIS — I2510 Atherosclerotic heart disease of native coronary artery without angina pectoris: Secondary | ICD-10-CM | POA: Diagnosis not present

## 2020-01-02 ENCOUNTER — Telehealth: Payer: Self-pay | Admitting: Pulmonary Disease

## 2020-01-02 NOTE — Telephone Encounter (Signed)
Called and spoke with patient to see if this was a legit refill request as [facility] is not listed as one of his pharmacies. He stated that he did not request for his medications to be sent [facility]. He wants to stay with [facility]. I advised him I would let [facility] know.   Left detailed message for [place].   Will close encounter.

## 2020-01-30 DIAGNOSIS — K219 Gastro-esophageal reflux disease without esophagitis: Secondary | ICD-10-CM | POA: Diagnosis not present

## 2020-01-30 DIAGNOSIS — I10 Essential (primary) hypertension: Secondary | ICD-10-CM | POA: Diagnosis not present

## 2020-01-30 DIAGNOSIS — I2510 Atherosclerotic heart disease of native coronary artery without angina pectoris: Secondary | ICD-10-CM | POA: Diagnosis not present

## 2020-01-30 DIAGNOSIS — E785 Hyperlipidemia, unspecified: Secondary | ICD-10-CM | POA: Diagnosis not present

## 2020-01-30 DIAGNOSIS — E782 Mixed hyperlipidemia: Secondary | ICD-10-CM | POA: Diagnosis not present

## 2020-02-02 ENCOUNTER — Other Ambulatory Visit: Payer: Self-pay | Admitting: Physician Assistant

## 2020-02-13 ENCOUNTER — Other Ambulatory Visit: Payer: Self-pay | Admitting: Physician Assistant

## 2020-02-25 ENCOUNTER — Other Ambulatory Visit: Payer: Self-pay | Admitting: Physician Assistant

## 2020-02-29 DIAGNOSIS — E782 Mixed hyperlipidemia: Secondary | ICD-10-CM | POA: Diagnosis not present

## 2020-02-29 DIAGNOSIS — E785 Hyperlipidemia, unspecified: Secondary | ICD-10-CM | POA: Diagnosis not present

## 2020-02-29 DIAGNOSIS — I10 Essential (primary) hypertension: Secondary | ICD-10-CM | POA: Diagnosis not present

## 2020-02-29 DIAGNOSIS — K219 Gastro-esophageal reflux disease without esophagitis: Secondary | ICD-10-CM | POA: Diagnosis not present

## 2020-02-29 DIAGNOSIS — J449 Chronic obstructive pulmonary disease, unspecified: Secondary | ICD-10-CM | POA: Diagnosis not present

## 2020-03-25 ENCOUNTER — Telehealth: Payer: Self-pay | Admitting: Pulmonary Disease

## 2020-03-25 DIAGNOSIS — J439 Emphysema, unspecified: Secondary | ICD-10-CM

## 2020-03-25 DIAGNOSIS — J449 Chronic obstructive pulmonary disease, unspecified: Secondary | ICD-10-CM

## 2020-03-25 NOTE — Telephone Encounter (Signed)
Called let patient know order was sent.  DME verified nothing further needed at this time.

## 2020-03-25 NOTE — Telephone Encounter (Signed)
Called and spoke with pt. Pt said he is needing new tubing for his nebulizer. Pt said he was told by DME that he should have replacement tubing every 30 days. Dr. Loanne Drilling, please advise on this.

## 2020-03-25 NOTE — Telephone Encounter (Signed)
If DME is missing an order for nebulizer supplies, please send order to patient's DME company. -JE

## 2020-03-26 DIAGNOSIS — J449 Chronic obstructive pulmonary disease, unspecified: Secondary | ICD-10-CM | POA: Diagnosis not present

## 2020-04-04 DIAGNOSIS — E782 Mixed hyperlipidemia: Secondary | ICD-10-CM | POA: Diagnosis not present

## 2020-04-04 DIAGNOSIS — I10 Essential (primary) hypertension: Secondary | ICD-10-CM | POA: Diagnosis not present

## 2020-04-04 DIAGNOSIS — K219 Gastro-esophageal reflux disease without esophagitis: Secondary | ICD-10-CM | POA: Diagnosis not present

## 2020-04-04 DIAGNOSIS — E785 Hyperlipidemia, unspecified: Secondary | ICD-10-CM | POA: Diagnosis not present

## 2020-04-04 DIAGNOSIS — J449 Chronic obstructive pulmonary disease, unspecified: Secondary | ICD-10-CM | POA: Diagnosis not present

## 2020-04-09 ENCOUNTER — Ambulatory Visit (HOSPITAL_COMMUNITY)
Admission: RE | Admit: 2020-04-09 | Discharge: 2020-04-09 | Disposition: A | Discharge status: Home / Self Care | Payer: Medicare Other | Source: Ambulatory Visit | Attending: Cardiovascular Disease | Admitting: Cardiovascular Disease

## 2020-04-09 ENCOUNTER — Ambulatory Visit: Payer: Medicare Other | Admitting: Cardiovascular Disease

## 2020-04-09 ENCOUNTER — Other Ambulatory Visit: Payer: Self-pay

## 2020-04-09 ENCOUNTER — Other Ambulatory Visit (HOSPITAL_COMMUNITY): Payer: Self-pay | Admitting: Physician Assistant

## 2020-04-09 ENCOUNTER — Encounter: Payer: Self-pay | Admitting: Cardiovascular Disease

## 2020-04-09 VITALS — BP 175/72 | HR 66 | Ht 68.0 in | Wt 160.4 lb

## 2020-04-09 DIAGNOSIS — I25810 Atherosclerosis of coronary artery bypass graft(s) without angina pectoris: Secondary | ICD-10-CM | POA: Diagnosis not present

## 2020-04-09 DIAGNOSIS — I771 Stricture of artery: Secondary | ICD-10-CM | POA: Insufficient documentation

## 2020-04-09 DIAGNOSIS — I10 Essential (primary) hypertension: Secondary | ICD-10-CM | POA: Diagnosis not present

## 2020-04-09 DIAGNOSIS — I6522 Occlusion and stenosis of left carotid artery: Secondary | ICD-10-CM | POA: Diagnosis not present

## 2020-04-09 DIAGNOSIS — Z951 Presence of aortocoronary bypass graft: Secondary | ICD-10-CM

## 2020-04-09 DIAGNOSIS — I6523 Occlusion and stenosis of bilateral carotid arteries: Secondary | ICD-10-CM | POA: Diagnosis not present

## 2020-04-09 DIAGNOSIS — I739 Peripheral vascular disease, unspecified: Secondary | ICD-10-CM

## 2020-04-09 DIAGNOSIS — E785 Hyperlipidemia, unspecified: Secondary | ICD-10-CM

## 2020-04-09 MED ORDER — CLOPIDOGREL BISULFATE 75 MG PO TABS: ORAL_TABLET | ORAL | 3 refills | Status: DC

## 2020-04-09 MED ORDER — AMLODIPINE BESYLATE 10 MG PO TABS
10.0000 mg | ORAL_TABLET | Freq: Every day | ORAL | 3 refills | Status: DC
Start: 2020-04-09 — End: 2021-01-02

## 2020-04-09 MED ORDER — EZETIMIBE 10 MG PO TABS: 10.0000 mg | ORAL_TABLET | Freq: Every day | ORAL | 2 refills | Status: DC

## 2020-04-09 MED ORDER — SPIRONOLACTONE 25 MG PO TABS: 12.5000 mg | ORAL_TABLET | Freq: Every day | ORAL | 3 refills | Status: DC

## 2020-04-09 NOTE — Patient Instructions (Signed)
Medication Instructions:  BEGIN TAKING SPIRONOLACTONE 12.5MG  DAILY  BEGIN TAKING ZETIA 10MG  DAILY  *If you need a refill on your cardiac medications before your next appointment, please call your pharmacy*   Lab Work: 3 MONTHS FASTING LABS: CMET LIPID  If you have labs (blood work) drawn today and your tests are completely normal, you will receive your results only by: Marland Kitchen MyChart Message (if you have MyChart) OR . A paper copy in the mail If you have any lab test that is abnormal or we need to change your treatment, we will call you to review the results.   Testing/Procedures: Your physician has requested that you have an echocardiogram. Echocardiography is a painless test that uses sound waves to create images of your heart. It provides your doctor with information about the size and shape of your heart and how well your heart's chambers and valves are working. This procedure takes approximately one hour. There are no restrictions for this procedure.  Your physician has requested that you have a lower extremity arterial duplex. This test is an ultrasound of the arteries in the legs. It looks at arterial blood flow in the legs. Allow one hour for Lower Arterial scans. There are no restrictions or special instructions   Follow-Up: At [facility], you and your health needs are our priority.  As part of our continuing mission to provide you with exceptional heart care, we have created designated Provider Care Teams.  These Care Teams include your primary Cardiologist (physician) and Advanced Practice Providers (APPs -  Physician Assistants and Nurse Practitioners) who all work together to provide you with the care you need, when you need it.  We recommend signing up for the patient portal called "MyChart".  Sign up information is provided on this After Visit Summary.  MyChart is used to connect with patients for Virtual Visits (Telemedicine).  Patients are able to view lab/test results,  encounter notes, upcoming appointments, etc.  Non-urgent messages can be sent to your provider as well.   To learn more about what you can do with MyChart, go to [email or web address].    Your next appointment:   3 month(s)  The format for your next appointment:   In Person  Provider:   Shelva Majestic, MD

## 2020-04-09 NOTE — Progress Notes (Signed)
Patient ID: Gregory Eaton, male   DOB: 23-Aug-1945, 74 y.o.   MRN: [id number]   PCP: Dr. Wende Neighbors  HPI: Gregory Eaton is a 74 y.o. male presents to the office today for a 8 month cardiology evaluation.  Gregory Eaton has CAD and underwent CABG revascularization surgery by Dr. Nils Pyle in 1998. In December 2010 he underwent esophageal cancer surgery at [facility]. Additional problems include hypertension as well as hyperlipidemia in addition to peripheral vascular disease. He has documented occluded left vertebral artery with normal antegrade flow the left external carotid and did have mild carotid stenoses are noted on his last Doppler study in detecting April 2013. He also is status post femoropopliteal bypass surgery and has occlusive disease in his right SFA.   A followup nuclear perfusion study on 07/24/2013 continued to reveal normal perfusion without scar or ischemia, 17 years after his CABG revascularization surgery. He had also undergone followup carotid studies which again showed an occluded left vertebral artery and mild disease in his right and left internal carotid system with narrowings less than 49%. He also is status post femoropopliteal bypass surgery. His last LE Doppler study from 07/10/2013 showed ABIs of 1.4 bilaterally at his ankles. He did have occlusive disease with reconstitution above the knee and the right SFA. His ABI values were now noncompressible.  Laboratory in August 2015 by his primary physician revealed mildly increased SGPT at 72 and an elevated calcium of 11.6.  He was hospitalized in September 2015 with pneumonia.  Followup calcium level was normal at 9.2.  LFTs were normal.  He did have a followup carotid duplex scan, which again showed a 50-69% stenosis of the left internal carotid artery.  Additionally, there was preocclusive stenosis and left external carotid.  He was hospitalized from November 7 - June 03, 2016 and was felt to have sepsis due to  pneumonia.  He initially was treated with vancomycin and Zosyn. He has a history of remote esophageal cancer and is status post surgery.  He has GERD which has been controlled with Protonix.    Whe I saw him in April 2018 he denied any recurrent chest pain. He saw Dr. Lenna Gilford  In 2018 for his COPD/emphysema.  There is no change in his prior right lower lobe pulmonary nodules.  He underwent lower extremity Doppler studies in April 2018, which revealed his known SFA disease.  The right ABI was within normal limits with abnormal waveforms.  The left ABI was not obtainable due to noncompressible vessels.  Bilateral great toe pressures were abnormal.  He denies any change in his claudication.  He denies any chest pain and continues to be on dual antiplatelet therapy.  He was on amlodipine 5 mg, losartan 75 mg, Toprol 50 mg for hypertension.  Losartan was titrated to 100 mg.  He was on Spiriva and  Singulair therapy for COPD.  He wason crestor for hyperlipidemia.   Since I  saw him in July 2018 he has continued to do relatively well but his blood pressure has been elevated.  He underwent  follow-up  lower extremity Dopplers in April 2019.  His right ABI was slightly increased from 1 year previously and his left ABIs were unchanged.  He saw [name], [place] in June 2019 and at that time he recommended that he switch taking losartan at bedtime but continue with amlodipine 5 mg and Toprol 50 mg in the morning.  In September 2019 he underwent follow-up carotid studies which  showed 1 to 39% stenoses bilaterally in the internal carotids.  There was nonhemodynamically significant plaque less than 50% in the right common carotid.  The right vertebral artery demonstrated antegrade flow.  The left vertebral artery was not visualized.  Incidentally, he was noted to have 3 vascular and heterogeneous areas in the left thyroid.    I  saw him in December 2019 at which time he remained stable without chest pain but his blood pressure was  elevated despite taking losartan 100 mg, amlodipine 5 mg, and Toprol-XL 50 mg.  I further titrated amlodipine to 10 mg.  He was on rosuvastatin for hyperlipidemia.  He was seen by [name], PA on January 15, 2019.  He was in need for total hip arthroplasty by Dr. Percell Miller.  He was advised to hold his aspirin and Plavix for the procedure.  He recently saw Dr. Loanne Drilling for pulmonary issues and his Anoro was changed to bevespi due to insurance.   I last saw him in January 2021 at which time he felt well.  He was able to be more active since his hip surgery.  He denies chest pain or wheezing.  He continues to see Dr. Wende Neighbors for primary care.   He underwent carotid Dopplers today prior to his office visit.  Recently, he has begun to notice progressive claudication symptoms initiating in his thighs and calves.  Typically he has to walk 240 feet from his house to his mailbox.  On the way back he has to stop due to claudication right lower extremity greater than left.  He admits to some shortness of breath with activity.  He denies chest pain or awareness of palpitations.  He states his blood pressure at home has been in the 135-145 range.  He presents for evaluation.   Past Medical History:  Diagnosis Date  . Adenomatous colon polyp 03/2009   Last colonoscopy by Dr. [name]   . Adenomatous polyp 2010  . Adenomatous polyp of colon 11/03/2010  . Arthritis   . Barrett's esophagus   . CAD (coronary artery disease)   . Complication of anesthesia    has a shortened esophogus due to CA.   Marland Kitchen COPD (chronic obstructive pulmonary disease) (HCC)    Severe emphysema per CT  . Diverticulosis   . Esophageal carcinoma ([place]) 03/2009   T1N1M0  . GERD (gastroesophageal reflux disease)   . Glaucoma   . History of Doppler ultrasound 11/09/2011   03/2014- 50-69% L ICA stenosis;carotid doppler; L bulb/prox ICA 0-49% diameter reduction; L vertebral artery - occlusive ds; L ECA  demonstrates severe amount of fibrous plaque  .  History of Doppler ultrasound 11/09/11   LEAs; R ABI - mod art. insuff.; L ABI normal at rest; R SFA - occlusive ds; L SFA - occlusive ds; patent fem-pop graft  . History of echocardiogram 08/27/2009   EF >55%  . History of kidney stones 1965  . History of nuclear stress test 11/24/2011   lexiscan; normal perfusion; low risk scan; non-diagnostic for ischemia  . Hyperlipidemia   . Hypertension   . Left carotid artery stenosis 04/08/2014  . Pulmonary nodule, right 04/08/2014   2.8 mm-incidental finding on CT  . PVD (peripheral vascular disease) ([place])   . Tachyarrhythmia 1999   Status post ablation at [facility]  . Tobacco abuse     Past Surgical History:  Procedure Laterality Date  . BIOPSY  10/24/2017   Procedure: BIOPSY;  Surgeon: [name], MD;  [facility]:  [facility];  Service: Endoscopy;;  esophagus  . COLONOSCOPY  03/17/2009   Dr.Rourk- normal rectum, sigmoid diverticula, some pale sigmoid mucosa with diffuse petechiae. pedunculated polyp at the splenic flexure, remainder of colonic mucosa appeared normal. bx= adenomatous polyp  . COLONOSCOPY  04/19/2003   Dr.Rehman- few diverticu;a at the sigmoid colon, 3 small polyps, one at the transverse colon and 2 at the sigmoid, small external hemorrhoids. bx report not available.   . COLONOSCOPY WITH PROPOFOL N/A 10/24/2017   Dr. [name]: Diverticulosis, 11 mm polyp at the ileocecal valve, tubular adenoma.  Repeat colonoscopy in 3 years  . CORONARY ARTERY BYPASS GRAFT  1998   [name]  . ESOPHAGECTOMY  2010   [facility] Dr. [name]  . ESOPHAGOGASTRODUODENOSCOPY  11/25/2010   Dr.Rourk- s/p esophagectomy with gastric pull-up, esophageal erosions straddling the surgical anastomosis, salmon colored epithelium coming up a good centimeter to a centimeter and a half above the suture line, islands of salmon colored epithelium in the most poximal residual esophagus, remainder of gastric mucosa appeared normal. bx= swamous &gastric glandular mucosa w/chronic  active inflammation  . ESOPHAGOGASTRODUODENOSCOPY  03/17/2009   Dr.Rourk- 4cm segment of salmon-colored epithlium distal esophagus suspicious for barretts esophagus. area of suspicious nodularity w/in this segment bx seperately. small to moderate size hiatal hernia, o/w normal stomach D1 and D2 bx=adenocarcinoma  . ESOPHAGOGASTRODUODENOSCOPY (EGD) WITH PROPOFOL N/A 10/24/2017   Dr. [name]: Remnant of esophagus with anastomosis with stomach at 24 cm from the incisors, 2 cm above the anastomosis he was found to have Barrett's but no dysplasia.  Advised for repeat EGD in 3 years  . FEMORAL-POPLITEAL BYPASS GRAFT  1993   occlusive ds in R SFA  . GASTRIC PULL THROUGH  2010   With esophagectomy  . LIPOMA EXCISION  2010  . POLYPECTOMY  10/24/2017   Procedure: POLYPECTOMY;  Surgeon: [name], MD;  Location: [facility];  Service: Endoscopy;;  colon   . TOTAL HIP ARTHROPLASTY Left 01/30/2019   Procedure: TOTAL HIP ARTHROPLASTY ANTERIOR APPROACH;  Surgeon: Renette Butters, MD;  Location: [facility];  Service: Orthopedics;  Laterality: Left;    Allergies  Allergen Reactions  . Altace [Ramipril] Cough    Current Outpatient Medications  Medication Sig Dispense Refill  . acetaminophen (TYLENOL) 500 MG tablet Take 500 mg by mouth daily as needed for headache.    . albuterol (PROVENTIL) (2.5 MG/3ML) 0.083% nebulizer solution Take 3 mLs (2.5 mg total) by nebulization every 6 (six) hours as needed for wheezing or shortness of breath. DX: J44.9 75 mL 6  . ALPRAZolam (XANAX) 0.5 MG tablet Take 1 tablet (0.5 mg total) by mouth at bedtime. 30 tablet 0  . amLODipine (NORVASC) 10 MG tablet Take 1 tablet (10 mg total) by mouth daily. 90 tablet 3  . aspirin EC 81 MG tablet Take 1 tablet (81 mg total) by mouth 2 (two) times daily. For DVT prophylaxis for 30 days after surgery. (Patient taking differently: Take 81 mg by mouth daily. ) 60 tablet 0  . baclofen (LIORESAL) 10 MG tablet Take 1 tablet (10 mg total) by  mouth 3 (three) times daily as needed for muscle spasms. 20 each 0  . brimonidine (ALPHAGAN) 0.2 % ophthalmic solution Place 1 drop into the right eye at bedtime.     . clopidogrel (PLAVIX) 75 MG tablet TAKE 1 TABLET BY MOUTH EVERY DAY 90 tablet 3  . DEXILANT 60 MG capsule TAKE ONE CAPSULE ($RemoveBefo'60MG'QtwuhtprqvU$  TOTAL) BY MOUTH DAILY BEFORE  SUPPER (Patient taking differently: Take 60 mg by mouth as needed. ) 30 capsule 5  . diclofenac sodium (VOLTAREN) 1 % GEL Apply 2 g topically 4 (four) times daily. Rub into affected area of foot 2 to 4 times daily (Patient taking differently: Apply 2 g topically 4 (four) times daily as needed (pain). Rub into affected area of foot 2 to 4 times daily) 100 g 2  . Glycopyrrolate-Formoterol (BEVESPI AEROSPHERE) 9-4.8 MCG/ACT AERO Inhale 2 puffs into the lungs 2 (two) times daily. 5.9 g 6  . guaiFENesin (MUCINEX) 600 MG 12 hr tablet Take 1,200 mg by mouth every 12 (twelve) hours as needed (cough).     . latanoprost (XALATAN) 0.005 % ophthalmic solution Place 1 drop into both eyes at bedtime.     Marland Kitchen losartan (COZAAR) 100 MG tablet TAKE ONE TABLET BY MOUTH EVERY NIGHT AT BEDTIME 90 tablet 3  . metoprolol succinate (TOPROL-XL) 50 MG 24 hr tablet TAKE ONE (1) TABLET BY MOUTH EVERY DAY 90 tablet 3  . montelukast (SINGULAIR) 10 MG tablet Take 1 tablet (10 mg total) by mouth daily. 90 tablet 6  . Multiple Vitamins-Minerals (CENTRUM SILVER PO) Take 1 tablet by mouth daily.      . nitroGLYCERIN (NITROSTAT) 0.4 MG SL tablet Place 1 tablet (0.4 mg total) under the tongue every 5 (five) minutes as needed for chest pain. 25 tablet 3  . rosuvastatin (CRESTOR) 10 MG tablet TAKE ONE (1) TABLET BY MOUTH EVERY DAY 90 tablet 3  . ezetimibe (ZETIA) 10 MG tablet Take 1 tablet (10 mg total) by mouth daily. 30 tablet 2  . spironolactone (ALDACTONE) 25 MG tablet Take 0.5 tablets (12.5 mg total) by mouth daily. 45 tablet 3   No current facility-administered medications for this visit.    Socially he is  widowed has 2 children. He quit smoking in 1997.  ROS General: Negative; No fevers, chills, or night sweats;  HEENT: Negative; No changes in vision or hearing, sinus congestion, difficulty swallowing Pulmonary: Positive for  COPD , pulmonary nodule; and recent pneumonia. Cardiovascular: Negative; No chest pain, presyncope, syncope, palpitations Status post femoropopliteal surgery 1993 positive for progressive claudication leg greater than left lower extremity GI: Positive for GERD; No nausea, vomiting, diarrhea, or abdominal pain GU: Negative; No dysuria, hematuria, or difficulty voiding Musculoskeletal: Negative; no myalgias, joint pain, or weakness Hematologic/Oncology: Negative; no easy bruising, bleeding Endocrine: Negative; no heat/cold intolerance; no diabetes Neuro: Negative; no changes in balance, headaches Skin: Negative; No rashes or skin lesions Psychiatric: Negative; No behavioral problems, depression Sleep: Negative; No snoring, daytime sleepiness, hypersomnolence, bruxism, restless legs, hypnogognic hallucinations, no cataplexy   PE BP (!) 175/72   Pulse 66   Ht $R'5\' 8"'HH$  (1.727 m)   Wt 160 lb 6.4 oz (72.8 kg)   SpO2 99%   BMI 24.39 kg/m    Repeat blood pressure by me was elevated at 158/74  Wt Readings from Last 3 Encounters:  04/09/20 160 lb 6.4 oz (72.8 kg)  10/22/19 164 lb 12.8 oz (74.8 kg)  09/26/19 165 lb (74.8 kg)   General: Alert, oriented, no distress.  Skin: normal turgor, no rashes, warm and dry HEENT: Normocephalic, atraumatic. Pupils equal round and reactive to light; sclera anicteric; extraocular muscles intact;  Nose without nasal septal hypertrophy Mouth/Parynx benign; Mallinpatti scale 3 Neck: No JVD, no carotid bruits; normal carotid upstroke Lungs: clear to ausculatation and percussion; no wheezing or rales Chest wall: without tenderness to palpitation Heart: PMI not displaced, RRR, s1  s2 normal, 1/6 systolic murmur, no diastolic murmur, no  rubs, gallops, thrills, or heaves Abdomen: soft, nontender; no hepatosplenomehaly, BS+; abdominal aorta nontender and not dilated by palpation. Back: no CVA tenderness Pulses 2+ upper extremity; bilateral femoral artery bruits.  Decreased dorsalis pedal pulses. Musculoskeletal: full range of motion, normal strength, no joint deformities Extremities: no clubbing cyanosis or edema, Homan's sign negative  Neurologic: grossly nonfocal; Cranial nerves grossly wnl Psychologic: Normal mood and affect   ECG (independently read by me): Normal sinus rhythm at 66 bpm, right bundle branch block with repolarization changes.  QTc interval 450 ms.  January 2021 ECG (independently read by me): Sinus rhythm at 75 bpm, right bundle branch block with repolarization changes, borderline first-degree AV block with a PR normal at 202 ms.  QTc interval 455 ms.  December 2019 ECG (independently read by me): Normal sinus rhythm at 72 bpm.  Right bundle branch block with repolarization changes.  No ectopy.  April 2018 ECG (independently read by me): Normal sinus rhythm at 65 bpm.  Right bundle-branch block with repolarization changes.  Borderline first-degree AV block with a PR interval 22 ms.  06/23/2016 ECG (independently read by me): normal sinus rhythm at 86 bpm.  Right bundle branch block with repolarization changes.  Small Q wave inferiorly.  November 2016 ECG (independently read by me):  Normal sinus rhythm with right bundle branch block and repolarization changes. First-degree AV block with PR interval of 210 ms.  March 2016 ECG (independently read by me): Normal sinus rhythm at 64 bpm.  Right bundle branch block with repolarization changes.  First-degree AV block with a PR interval of 228 ms.  September 2015 ECG (independently read by me): Sinus rhythm with first-degree AV block.  PR interval 214 ms.  Right bundle branch block with repolarization changes.  January 2015 ECG (independently read by me): Sinus  rhythm 86 beats per minute. PR interval 200 ms.  Prior ECG of July 2014 :Sinus rhythm at 70 beats per minute. First degree AV block. 1 isolated unifocal PVC.  LABS:  BMP Latest Ref Rng & Units 01/22/2019 10/19/2017 06/02/2016  Glucose 70 - 99 mg/dL 113(H) 142(H) 115(H)  BUN 8 - 23 mg/dL $Remove'16 13 10  'ksQSiAJ$ Creatinine 0.61 - 1.24 mg/dL 0.83 0.82 0.85  Sodium 135 - 145 mmol/L 143 139 137  Potassium 3.5 - 5.1 mmol/L 4.8 4.2 4.0  Chloride 98 - 111 mmol/L 110 105 104  CO2 22 - 32 mmol/L 26 21(L) 25  Calcium 8.9 - 10.3 mg/dL 9.5 9.1 8.6(L)    Hepatic Function Latest Ref Rng & Units 05/25/2016 04/07/2014 03/15/2014  Total Protein 6.5 - 8.1 g/dL 6.8 7.7 6.9  Albumin 3.5 - 5.0 g/dL 3.1(L) 4.1 3.7  AST 15 - 41 U/L 14(L) 20 15  ALT 17 - 63 U/L 13(L) 19 15  Alk Phosphatase 38 - 126 U/L 63 89 77  Total Bilirubin 0.3 - 1.2 mg/dL 0.9 0.5 0.5    CBC Latest Ref Rng & Units 01/22/2019 10/19/2017 06/02/2016  WBC 4.0 - 10.5 K/uL 7.9 6.4 14.6(H)  Hemoglobin 13.0 - 17.0 g/dL 14.9 14.6 13.3  Hematocrit 39 - 52 % 46.7 44.1 40.1  Platelets 150 - 400 K/uL 312 282 531(H)    Lab Results  Component Value Date   MCV 94.0 01/22/2019   MCV 92.3 10/19/2017   MCV 91.8 06/02/2016   Lab Results  Component Value Date   TSH 1.850 04/07/2014    BNP    Component Value Date/Time  PROBNP 190.6 (H) 04/07/2014 1600    Lipid Panel     Component Value Date/Time   CHOL 96 01/31/2013 0940   TRIG 78 01/31/2013 0940   HDL 37 (L) 01/31/2013 0940   CHOLHDL 2.6 01/31/2013 0940   VLDL 16 01/31/2013 0940   LDLCALC 43 01/31/2013 0940     RADIOLOGY: No results found.  IMPRESSION:  1. Coronary artery disease involving coronary bypass graft of native heart without angina pectoris   2. Hx of CABG   3. Essential hypertension   4. PAD (peripheral artery disease) ([place])   5. Claudication in peripheral vascular disease ([place])   6. Left carotid artery stenosis   7. Hyperlipidemia with target LDL less than 70     ASSESSMENT AND  PLAN: Gregory Eaton is a 74 year old gentleman who has coronary as well as peripheral vascular disease.  He is s/p CABG revascularization surgery in 1998.  A nuclear perfusion study in 2015 suggested  patency of his grafts and this was without scar or ischemia. His CAD appears stable and he is not having anginal symptoms on current therapy. His carotid ultrasound  on 04/07/2014 showed bilateral atherosclerotic plaque in the carotid bulb and internal carotid arteries with focal 60-69% stenosis in the left internal carotid and he had preocclusive narrowing in the left external carotid.  His left vertebral artery was not visualized.  He is status post status post femoropopliteal bypass surgery.  His 2019 lower extremity Doppler revealed noncompressible arteries with abnormal toe brachial index but on the most recent study the right ABI was slightly increased from 1 year previously.  His  carotid studies in September 2020 showed 1 to 39% stenosis in the right ICA, nonhemodynamic significant plaque of less than 50% in the common carotid artery.  The left carotid had 40 to 59% ICA stenosis.  The right vertebral artery had normal antegrade flow.  The left vertebral artery was occluded.  There was stenosis noted in the left subclavian artery.  Prior to his office visit with me today he had undergone 1 year follow-up carotid imaging.  The patient now has noticed progressive claudication symptoms particularly involving his thighs and calf bilaterally right greater than left.  He has significant femoral artery bruits with decreased dorsalis pedis pulses.  I am recommending he undergo follow-up lower extremity arterial Doppler study for further evaluation.  His blood pressure today is elevated despite taking amlodipine 10 mg, losartan 100 mg, and metoprolol 50 mg daily.  He also has noticed shortness of breath and at times some swelling.  I have elected to add spironolactone 12.5 mg to his regimen.  I suspect he may have a  component of diastolic dysfunction contributing to his shortness of breath.  I am scheduling him for follow-up echo Doppler study.  He has been on rosuvastatin 10 mg in the past had issues with increasing statin dose is secondary to myalgias.  In attempt to get an LDL less than 70 I am adding Zetia 10 mg to his medical regimen.  3 months he will undergo follow-up chemistry profile and lipid studies and I will see him in the office for further evaluation   Gregory Sine, MD, [facility]  04/10/2020 6:02 PM

## 2020-04-10 ENCOUNTER — Encounter: Payer: Self-pay | Admitting: Cardiovascular Disease

## 2020-04-14 ENCOUNTER — Other Ambulatory Visit: Payer: Self-pay

## 2020-04-14 ENCOUNTER — Ambulatory Visit (HOSPITAL_COMMUNITY)
Admission: RE | Admit: 2020-04-14 | Discharge: 2020-04-14 | Disposition: A | Discharge status: Home / Self Care | Payer: Medicare Other | Source: Ambulatory Visit | Attending: Cardiovascular Disease | Admitting: Cardiovascular Disease

## 2020-04-14 ENCOUNTER — Other Ambulatory Visit: Payer: Self-pay | Admitting: Cardiovascular Disease

## 2020-04-14 DIAGNOSIS — I739 Peripheral vascular disease, unspecified: Secondary | ICD-10-CM | POA: Diagnosis not present

## 2020-04-14 DIAGNOSIS — Z951 Presence of aortocoronary bypass graft: Secondary | ICD-10-CM | POA: Diagnosis not present

## 2020-04-14 DIAGNOSIS — I10 Essential (primary) hypertension: Secondary | ICD-10-CM | POA: Diagnosis not present

## 2020-04-14 DIAGNOSIS — I25810 Atherosclerosis of coronary artery bypass graft(s) without angina pectoris: Secondary | ICD-10-CM

## 2020-04-14 DIAGNOSIS — I6522 Occlusion and stenosis of left carotid artery: Secondary | ICD-10-CM | POA: Diagnosis not present

## 2020-04-14 LAB — ECHOCARDIOGRAM COMPLETE
AR max vel: 2.49 cm2
AV Area VTI: 2.57 cm2
AV Area mean vel: 2.44 cm2
AV Mean grad: 3.8 mmHg
AV Peak grad: 7.9 mmHg
Ao pk vel: 1.4 m/s
Area-P 1/2: 2.96 cm2
S' Lateral: 2.23 cm

## 2020-04-14 NOTE — Progress Notes (Signed)
*  PRELIMINARY RESULTS* Echocardiogram 2D Echocardiogram has been performed.  Gregory Eaton 04/14/2020, 3:31 PM

## 2020-04-30 ENCOUNTER — Encounter: Payer: Self-pay | Admitting: Pulmonary Disease

## 2020-04-30 ENCOUNTER — Ambulatory Visit: Payer: Medicare Other | Admitting: Pulmonary Disease

## 2020-04-30 ENCOUNTER — Other Ambulatory Visit: Payer: Self-pay

## 2020-04-30 VITALS — BP 130/64 | HR 85 | Temp 96.7°F | Ht 68.0 in | Wt 158.0 lb

## 2020-04-30 DIAGNOSIS — J449 Chronic obstructive pulmonary disease, unspecified: Secondary | ICD-10-CM

## 2020-04-30 MED ORDER — PREDNISONE 20 MG PO TABS: 40.0000 mg | ORAL_TABLET | Freq: Every day | ORAL | 0 refills | Status: AC

## 2020-04-30 MED ORDER — BEVESPI AEROSPHERE 9-4.8 MCG/ACT IN AERO
2.0000 | INHALATION_SPRAY | Freq: Two times a day (BID) | RESPIRATORY_TRACT | 6 refills | Status: DC
Start: 2020-04-30 — End: 2020-11-05

## 2020-04-30 NOTE — Progress Notes (Signed)
Subjective:   PATIENT ID: Gregory Eaton GENDER: male DOB: 15-May-1946, MRN: [id number]   HPI  Chief Complaint  Patient presents with  . Follow-up    doing well    Reason for Visit: Follow-up   Gregory Eaton is a 74 year old male former smoker with COPD (FEV1 59%), status post CABG, hypertension, PVD and history of esophageal cancer status post esophagectomy and gastric pull-through in 2010 who presents for follow-up.  04/30/20 He reports is COPD is well-controlled. Compliant with Bevespi. Very rarely uses albuterol. Denies shortness of breath, wheezing or cough. He has occasional congestion that is relieved after coughing.  Denies any recent exacerbations requiring steroids or antibiotics. No ED/urgent care visits. Able to perform yardwork. States walking is mainly limited due to hip pain. Walking uphill remains difficult due to the steep incline.  Social History: 60-pack-year smoking history. Quit in 1997 Previous employed as a Administrator, previously did Dealer work  Programme researcher, broadcasting/film/video exposures: Hx of Architect x 2 years  I have personally reviewed patient's past medical/family/social history/allergies/current medications.  Past Medical History:  Diagnosis Date  . Adenomatous colon polyp 03/2009   Last colonoscopy by Dr. [name]   . Adenomatous polyp 2010  . Adenomatous polyp of colon 11/03/2010  . Arthritis   . Barrett's esophagus   . CAD (coronary artery disease)   . Complication of anesthesia    has a shortened esophogus due to CA.   Marland Kitchen COPD (chronic obstructive pulmonary disease) (HCC)    Severe emphysema per CT  . Diverticulosis   . Esophageal carcinoma ([place]) 03/2009   T1N1M0  . GERD (gastroesophageal reflux disease)   . Glaucoma   . History of Doppler ultrasound 11/09/2011   03/2014- 50-69% L ICA stenosis;carotid doppler; L bulb/prox ICA 0-49% diameter reduction; L vertebral artery - occlusive ds; L ECA  demonstrates severe amount of fibrous plaque  .  History of Doppler ultrasound 11/09/11   LEAs; R ABI - mod art. insuff.; L ABI normal at rest; R SFA - occlusive ds; L SFA - occlusive ds; patent fem-pop graft  . History of echocardiogram 08/27/2009   EF >55%  . History of kidney stones 1965  . History of nuclear stress test 11/24/2011   lexiscan; normal perfusion; low risk scan; non-diagnostic for ischemia  . Hyperlipidemia   . Hypertension   . Left carotid artery stenosis 04/08/2014  . Pulmonary nodule, right 04/08/2014   2.8 mm-incidental finding on CT  . PVD (peripheral vascular disease) ([place])   . Tachyarrhythmia 1999   Status post ablation at [facility]  . Tobacco abuse     Outpatient Medications Prior to Visit  Medication Sig Dispense Refill  . acetaminophen (TYLENOL) 500 MG tablet Take 500 mg by mouth daily as needed for headache.    . albuterol (PROVENTIL) (2.5 MG/3ML) 0.083% nebulizer solution Take 3 mLs (2.5 mg total) by nebulization every 6 (six) hours as needed for wheezing or shortness of breath. DX: J44.9 75 mL 6  . ALPRAZolam (XANAX) 0.5 MG tablet Take 1 tablet (0.5 mg total) by mouth at bedtime. 30 tablet 0  . amLODipine (NORVASC) 10 MG tablet Take 1 tablet (10 mg total) by mouth daily. 90 tablet 3  . aspirin EC 81 MG tablet Take 1 tablet (81 mg total) by mouth 2 (two) times daily. For DVT prophylaxis for 30 days after surgery. (Patient taking differently: Take 81 mg by mouth daily. ) 60 tablet 0  . baclofen (LIORESAL) 10 MG tablet Take 1  tablet (10 mg total) by mouth 3 (three) times daily as needed for muscle spasms. 20 each 0  . brimonidine (ALPHAGAN) 0.2 % ophthalmic solution Place 1 drop into the right eye at bedtime.     . clopidogrel (PLAVIX) 75 MG tablet TAKE 1 TABLET BY MOUTH EVERY DAY 90 tablet 3  . DEXILANT 60 MG capsule TAKE ONE CAPSULE (60MG  TOTAL) BY MOUTH DAILY BEFORE SUPPER (Patient taking differently: Take 60 mg by mouth as needed. ) 30 capsule 5  . diclofenac sodium (VOLTAREN) 1 % GEL Apply 2 g topically 4 (four)  times daily. Rub into affected area of foot 2 to 4 times daily (Patient taking differently: Apply 2 g topically 4 (four) times daily as needed (pain). Rub into affected area of foot 2 to 4 times daily) 100 g 2  . ezetimibe (ZETIA) 10 MG tablet Take 1 tablet (10 mg total) by mouth daily. 30 tablet 2  . Glycopyrrolate-Formoterol (BEVESPI AEROSPHERE) 9-4.8 MCG/ACT AERO Inhale 2 puffs into the lungs 2 (two) times daily. 5.9 g 6  . guaiFENesin (MUCINEX) 600 MG 12 hr tablet Take 1,200 mg by mouth every 12 (twelve) hours as needed (cough).     . latanoprost (XALATAN) 0.005 % ophthalmic solution Place 1 drop into both eyes at bedtime.     Marland Kitchen losartan (COZAAR) 100 MG tablet TAKE ONE TABLET BY MOUTH EVERY NIGHT AT BEDTIME 90 tablet 3  . metoprolol succinate (TOPROL-XL) 50 MG 24 hr tablet TAKE ONE (1) TABLET BY MOUTH EVERY DAY 90 tablet 3  . montelukast (SINGULAIR) 10 MG tablet Take 1 tablet (10 mg total) by mouth daily. 90 tablet 6  . Multiple Vitamins-Minerals (CENTRUM SILVER PO) Take 1 tablet by mouth daily.      . nitroGLYCERIN (NITROSTAT) 0.4 MG SL tablet Place 1 tablet (0.4 mg total) under the tongue every 5 (five) minutes as needed for chest pain. 25 tablet 3  . rosuvastatin (CRESTOR) 10 MG tablet TAKE ONE (1) TABLET BY MOUTH EVERY DAY 90 tablet 3  . spironolactone (ALDACTONE) 25 MG tablet Take 0.5 tablets (12.5 mg total) by mouth daily. 45 tablet 3   No facility-administered medications prior to visit.    Review of Systems  Constitutional: Negative for chills, diaphoresis, fever, malaise/fatigue and weight loss.  HENT: Negative for congestion.   Respiratory: Positive for cough, sputum production and shortness of breath. Negative for hemoptysis and wheezing.   Cardiovascular: Negative for chest pain, palpitations and leg swelling.     Objective:   Vitals:   04/30/20 1049  BP: 130/64  Pulse: 85  Temp: (!) 96.7 F (35.9 C)  TempSrc: Temporal  SpO2: 95%  Weight: 158 lb (71.7 kg)  Height:  5\' 8"  (1.727 m)   SpO2: 95 % O2 Device: None (Room air)  Physical Exam: General: Well-appearing, no acute distress HENT: Rule, AT, OP clear, MMM Eyes: EOMI, no scleral icterus Respiratory: Clear to auscultation bilaterally.  No crackles, wheezing or rales Cardiovascular: RRR, -M/R/G, no JVD Extremities:-Edema,-tenderness Neuro: AAO x4, CNII-XII grossly intact Skin: Intact, no rashes or bruising Psych: Normal mood, normal affect  Data Reviewed:  Imaging: CT Chest 11/22/17 - Centrilobular and paraseptal emphysema. 45mm nodule in the RLL, unchanged since 2015  PFT: 03/23/2016 FVC 4.1 L (101 %) FEV1 1.8 (59 %) Ratio 58  Interpretation: Moderately severe obstructive defect.  Normal vital capacity.    Assessment & Plan:   Discussion: 74 year old male with moderately severe COPD, hx esophageal CA s/p esophagectomy, hx CABG  and PVD on DAPT and stable RLL nodule who presents for follow-up.  Moderately severe COPD (FEV1 59%): GOLD Class B, well-controlled --REFILL Bevespi. Take as instructed --CONTINUE montelukast 10 mg daily --CONTINUE Albuterol as needed for shortness of breath --Provided Prednisone pack as needed. Patient will call office when he takes and if symptoms not improving  RLL lung nodule: Stable 9mm nodule, unchanged since 2015 No further imaging indicated  Health Maintenance Immunization History  Administered Date(s) Administered  . Influenza Inj Mdck Quad Pf 04/17/2016  . Influenza, High Dose Seasonal PF 05/09/2017, 04/21/2018, 04/19/2019  . Influenza,inj,Quad PF,6+ Mos 04/19/2015, 04/17/2016  . Influenza-Unspecified 05/29/2010, 05/01/2014  . Pneumococcal Conjugate-13 04/01/2014  . Pneumococcal Polysaccharide-23 04/17/2016  . Td 05/17/2012  UTD on COVID vaccinations 2021  CT Lung Screen - Not qualified  No orders of the defined types were placed in this encounter.  Meds ordered this encounter  Medications  . predniSONE (DELTASONE) 20 MG tablet    Sig: Take 2  tablets (40 mg total) by mouth daily with breakfast for 5 days.    Dispense:  10 tablet    Refill:  0  . Glycopyrrolate-Formoterol (BEVESPI AEROSPHERE) 9-4.8 MCG/ACT AERO    Sig: Inhale 2 puffs into the lungs 2 (two) times daily.    Dispense:  5.9 g    Refill:  6    Order Specific Question:   Lot Number?    Answer:   [id number]    Order Specific Question:   Expiration Date?    Answer:   12/18/2019    Order Specific Question:   Manufacturer?    Answer:   [employer] [[id number]]    Order Specific Question:   Quantity    Answer:   1   Return in about 6 months (around 10/29/2020).   [place], MD [facility] 04/30/2020 11:02 AM  Office Number [phone]

## 2020-04-30 NOTE — Patient Instructions (Signed)
Moderately severe COPD (FEV1 59%): GOLD Class B, well-controlled --REFILL Bevespi. Take as instructed --CONTINUE montelukast 10 mg daily --CONTINUE Albuterol as needed for shortness of breath --Provided Prednisone pack as needed. Patient will call office when he takes and if symptoms not improving  Follow-up in 6 months

## 2020-05-01 ENCOUNTER — Ambulatory Visit (HOSPITAL_COMMUNITY)
Admission: RE | Admit: 2020-05-01 | Discharge: 2020-05-01 | Disposition: A | Discharge status: Home / Self Care | Payer: Medicare Other | Source: Ambulatory Visit | Attending: Internal Medicine | Admitting: Internal Medicine

## 2020-05-01 DIAGNOSIS — I10 Essential (primary) hypertension: Secondary | ICD-10-CM | POA: Insufficient documentation

## 2020-05-01 DIAGNOSIS — I739 Peripheral vascular disease, unspecified: Secondary | ICD-10-CM | POA: Diagnosis not present

## 2020-05-01 DIAGNOSIS — I25810 Atherosclerosis of coronary artery bypass graft(s) without angina pectoris: Secondary | ICD-10-CM | POA: Insufficient documentation

## 2020-05-01 DIAGNOSIS — Z951 Presence of aortocoronary bypass graft: Secondary | ICD-10-CM | POA: Insufficient documentation

## 2020-05-01 DIAGNOSIS — I6522 Occlusion and stenosis of left carotid artery: Secondary | ICD-10-CM | POA: Insufficient documentation

## 2020-05-02 ENCOUNTER — Telehealth: Payer: Self-pay | Admitting: Cardiovascular Disease

## 2020-05-02 DIAGNOSIS — B3784 Candidal otitis externa: Secondary | ICD-10-CM | POA: Diagnosis not present

## 2020-05-02 DIAGNOSIS — Z6823 Body mass index (BMI) 23.0-23.9, adult: Secondary | ICD-10-CM | POA: Diagnosis not present

## 2020-05-02 DIAGNOSIS — M5481 Occipital neuralgia: Secondary | ICD-10-CM | POA: Diagnosis not present

## 2020-05-02 DIAGNOSIS — Z23 Encounter for immunization: Secondary | ICD-10-CM | POA: Diagnosis not present

## 2020-05-02 DIAGNOSIS — R7301 Impaired fasting glucose: Secondary | ICD-10-CM | POA: Diagnosis not present

## 2020-05-02 DIAGNOSIS — I10 Essential (primary) hypertension: Secondary | ICD-10-CM | POA: Diagnosis not present

## 2020-05-02 DIAGNOSIS — E785 Hyperlipidemia, unspecified: Secondary | ICD-10-CM | POA: Diagnosis not present

## 2020-05-02 NOTE — Telephone Encounter (Signed)
    Pt is returning call for his echo result  

## 2020-05-02 NOTE — Telephone Encounter (Signed)
Attempted to call patient, left message for patient to call back to office.   

## 2020-05-05 ENCOUNTER — Telehealth: Payer: Self-pay | Admitting: Cardiovascular Disease

## 2020-05-05 NOTE — Telephone Encounter (Signed)
Troy Sine, MD  05/01/2020 7:38 AM EDT     Normal LV function, mild LVH.       Peter M Martinique, MD  04/15/2020 9:10 AM EDT     This study demonstrates: Echo looks good. Mild LVH. Normal EF. Valves are OK. Medication changes / Follow up studies / Other recommendations:  None  Please send results to the PCP: Celene Squibb, MD  Peter Martinique, MD 04/15/2020 9:10 AM      The patient has been notified of the result and verbalized understanding.  All questions (if any) were answered. Nuala Alpha, LPN 84/13/2440 10:27 AM

## 2020-05-05 NOTE — Telephone Encounter (Signed)
Left message to call back  

## 2020-05-05 NOTE — Telephone Encounter (Signed)
Please see updated encounter  

## 2020-05-05 NOTE — Telephone Encounter (Signed)
New message:      Patient calling stating that some one called him for result of his ECHO. Please call patient.

## 2020-05-07 DIAGNOSIS — I2510 Atherosclerotic heart disease of native coronary artery without angina pectoris: Secondary | ICD-10-CM | POA: Diagnosis not present

## 2020-05-07 DIAGNOSIS — J449 Chronic obstructive pulmonary disease, unspecified: Secondary | ICD-10-CM | POA: Diagnosis not present

## 2020-05-07 DIAGNOSIS — I10 Essential (primary) hypertension: Secondary | ICD-10-CM | POA: Diagnosis not present

## 2020-05-07 DIAGNOSIS — E782 Mixed hyperlipidemia: Secondary | ICD-10-CM | POA: Diagnosis not present

## 2020-05-07 DIAGNOSIS — K219 Gastro-esophageal reflux disease without esophagitis: Secondary | ICD-10-CM | POA: Diagnosis not present

## 2020-05-07 DIAGNOSIS — E7849 Other hyperlipidemia: Secondary | ICD-10-CM | POA: Diagnosis not present

## 2020-05-22 ENCOUNTER — Other Ambulatory Visit: Payer: Self-pay

## 2020-05-22 DIAGNOSIS — I739 Peripheral vascular disease, unspecified: Secondary | ICD-10-CM

## 2020-06-02 DIAGNOSIS — E785 Hyperlipidemia, unspecified: Secondary | ICD-10-CM | POA: Diagnosis not present

## 2020-06-02 DIAGNOSIS — I2510 Atherosclerotic heart disease of native coronary artery without angina pectoris: Secondary | ICD-10-CM | POA: Diagnosis not present

## 2020-06-02 DIAGNOSIS — K219 Gastro-esophageal reflux disease without esophagitis: Secondary | ICD-10-CM | POA: Diagnosis not present

## 2020-06-02 DIAGNOSIS — I10 Essential (primary) hypertension: Secondary | ICD-10-CM | POA: Diagnosis not present

## 2020-06-02 DIAGNOSIS — E782 Mixed hyperlipidemia: Secondary | ICD-10-CM | POA: Diagnosis not present

## 2020-06-05 DIAGNOSIS — H401132 Primary open-angle glaucoma, bilateral, moderate stage: Secondary | ICD-10-CM | POA: Diagnosis not present

## 2020-07-03 ENCOUNTER — Ambulatory Visit: Payer: Medicare Other | Admitting: Cardiovascular Disease

## 2020-07-03 ENCOUNTER — Encounter: Payer: Self-pay | Admitting: Cardiovascular Disease

## 2020-07-03 ENCOUNTER — Other Ambulatory Visit: Payer: Self-pay

## 2020-07-03 VITALS — BP 132/68 | HR 78 | Ht 68.0 in | Wt 160.0 lb

## 2020-07-03 DIAGNOSIS — I739 Peripheral vascular disease, unspecified: Secondary | ICD-10-CM

## 2020-07-03 DIAGNOSIS — E785 Hyperlipidemia, unspecified: Secondary | ICD-10-CM

## 2020-07-03 DIAGNOSIS — I6522 Occlusion and stenosis of left carotid artery: Secondary | ICD-10-CM | POA: Diagnosis not present

## 2020-07-03 DIAGNOSIS — Z951 Presence of aortocoronary bypass graft: Secondary | ICD-10-CM

## 2020-07-03 DIAGNOSIS — I2510 Atherosclerotic heart disease of native coronary artery without angina pectoris: Secondary | ICD-10-CM | POA: Diagnosis not present

## 2020-07-03 NOTE — Patient Instructions (Signed)
Medication Instructions:  No changes *If you need a refill on your cardiac medications before your next appointment, please call your pharmacy*   Lab Work: No changes If you have labs (blood work) drawn today and your tests are completely normal, you will receive your results only by:  [place] (if you have MyChart) OR  A paper copy in the mail If you have any lab test that is abnormal or we need to change your treatment, we will call you to review the results.   Testing/Procedures: None ordered   Follow-Up: At [facility], you and your health needs are our priority.  As part of our continuing mission to provide you with exceptional heart care, we have created designated Provider Care Teams.  These Care Teams include your primary Cardiologist (physician) and Advanced Practice Providers (APPs -  Physician Assistants and Nurse Practitioners) who all work together to provide you with the care you need, when you need it.  We recommend signing up for the patient portal called "MyChart".  Sign up information is provided on this After Visit Summary.  MyChart is used to connect with patients for Virtual Visits (Telemedicine).  Patients are able to view lab/test results, encounter notes, upcoming appointments, etc.  Non-urgent messages can be sent to your provider as well.   To learn more about what you can do with MyChart, go to [email or web address].    Your next appointment:   6 month(s)  The format for your next appointment:   In Person  Provider:   Shelva Majestic, MD   Other Instructions None

## 2020-07-03 NOTE — Progress Notes (Signed)
Patient ID: Gregory Eaton, male   DOB: [birth year], 74 y.o.   MRN: [id number]   PCP: Dr. Wende Neighbors  HPI: Gregory Eaton is a 74 y.o. male presents to the office today for a 3 month cardiology evaluation.  Gregory Eaton has CAD and underwent CABG revascularization surgery by Dr. Nils Pyle in 1998. In December 2010 he underwent esophageal cancer surgery at [facility]. Additional problems include hypertension as well as hyperlipidemia in addition to peripheral vascular disease. He has documented occluded left vertebral artery with normal antegrade flow the left external carotid and did have mild carotid stenoses are noted on his last Doppler study in detecting April 2013. He also is status post femoropopliteal bypass surgery and has occlusive disease in his right SFA.   A followup nuclear perfusion study on 07/24/2013 continued to reveal normal perfusion without scar or ischemia, 17 years after his CABG revascularization surgery. He had also undergone followup carotid studies which again showed an occluded left vertebral artery and mild disease in his right and left internal carotid system with narrowings less than 49%. He also is status post femoropopliteal bypass surgery. His last LE Doppler study from 07/10/2013 showed ABIs of 1.4 bilaterally at his ankles. He did have occlusive disease with reconstitution above the knee and the right SFA. His ABI values were now noncompressible.  Laboratory in August 2015 by his primary physician revealed mildly increased SGPT at 72 and an elevated calcium of 11.6.  He was hospitalized in September 2015 with pneumonia.  Followup calcium level was normal at 9.2.  LFTs were normal.  He did have a followup carotid duplex scan, which again showed a 50-69% stenosis of the left internal carotid artery.  Additionally, there was preocclusive stenosis and left external carotid.  He was hospitalized from November 7 - June 03, 2016 and was felt to have sepsis due to  pneumonia.  He initially was treated with vancomycin and Zosyn. He has a history of remote esophageal cancer and is status post surgery.  He has GERD which has been controlled with Protonix.    Whe I saw him in April 2018 he denied any recurrent chest pain. He saw Dr. Lenna Gilford  In 2018 for his COPD/emphysema.  There is no change in his prior right lower lobe pulmonary nodules.  He underwent lower extremity Doppler studies in April 2018, which revealed his known SFA disease.  The right ABI was within normal limits with abnormal waveforms.  The left ABI was not obtainable due to noncompressible vessels.  Bilateral great toe pressures were abnormal.  He denies any change in his claudication.  He denies any chest pain and continues to be on dual antiplatelet therapy.  He was on amlodipine 5 mg, losartan 75 mg, Toprol 50 mg for hypertension.  Losartan was titrated to 100 mg.  He was on Spiriva and  Singulair therapy for COPD.  He wason crestor for hyperlipidemia.   Since I saw him in July 2018 he has continued to do relatively well but his blood pressure has been elevated.  He underwent  follow-up  lower extremity Dopplers in April 2019.  His right ABI was slightly increased from 1 year previously and his left ABIs were unchanged.  He saw [name], [place] in June 2019 and at that time he recommended that he switch taking losartan at bedtime but continue with amlodipine 5 mg and Toprol 50 mg in the morning.  In September 2019 he underwent follow-up carotid studies which showed  1 to 39% stenoses bilaterally in the internal carotids.  There was nonhemodynamically significant plaque less than 50% in the right common carotid.  The right vertebral artery demonstrated antegrade flow.  The left vertebral artery was not visualized.  Incidentally, he was noted to have 3 vascular and heterogeneous areas in the left thyroid.    I saw him in December 2019 at which time he remained stable without chest pain but his blood pressure was  elevated despite taking losartan 100 mg, amlodipine 5 mg, and Toprol-XL 50 mg.  I further titrated amlodipine to 10 mg.  He was on rosuvastatin for hyperlipidemia.  He was seen by [name], [place] on January 15, 2019.  He was in need for total hip arthroplasty by Dr. Percell Miller.  He was advised to hold his aspirin and Plavix for the procedure.  He recently saw Dr. Loanne Drilling for pulmonary issues and his Anoro was changed to bevespi due to insurance.   I saw him in January 2021 at which time he felt well.  He was able to be more active since his hip surgery.  He denies chest pain or wheezing.  He continues to see Dr. Wende Neighbors for primary care.   I last saw him in September 2021.  He had undergone carotid studies prior to his office visit.  At that time he had begun to notice progressive claudication symptoms initiating in his thighs and calves.  He would often have to stop when getting the mail which would typically be 240 feet from his house to his mailbox.  He denied any chest pain or palpitations.  He had noticed shortness of breath and had blood pressures typically running between 135 and 145.  At that time I recommended initiation of spironolactone 12.5 mg to his regimen.  I scheduled him for a follow-up echo Doppler study which was done on April 14, 2020 and showed an EF of 60 to 65%.  There was mild LVH.  Diastolic parameters were indeterminate.  He underwent lower extremity Doppler studies which revealed moderate right lower extremity arterial disease with slightly decreased ABIs and TBI's compared to prior study without change in the left side.  Presently, he denies any chest pain.  He continues to experience some mild claudication symptoms particularly when walking uphill.  He is unaware of palpitations.  He continues to be on amlodipine 10 mg, metoprolol succinate 50 mg, losartan 100 mg and his blood pressure had improved with the addition of spironolactone 12.5 mg daily.  He continues to be on DAPT.  He is  on Zetia and rosuvastatin for hyperlipidemia.  He presents for reevaluation.   Past Medical History:  Diagnosis Date  . Adenomatous colon polyp 03/2009   Last colonoscopy by Dr. [name]   . Adenomatous polyp 2010  . Adenomatous polyp of colon 11/03/2010  . Arthritis   . Barrett's esophagus   . CAD (coronary artery disease)   . Complication of anesthesia    has a shortened esophogus due to CA.   Marland Kitchen COPD (chronic obstructive pulmonary disease) (HCC)    Severe emphysema per CT  . Diverticulosis   . Esophageal carcinoma ([place]) 03/2009   [id number]  . GERD (gastroesophageal reflux disease)   . Glaucoma   . History of Doppler ultrasound 11/09/2011   03/2014- 50-69% L ICA stenosis;carotid doppler; L bulb/prox ICA 0-49% diameter reduction; L vertebral artery - occlusive ds; L ECA  demonstrates severe amount of fibrous plaque  . History of Doppler ultrasound 11/09/11  LEAs; R ABI - mod art. insuff.; L ABI normal at rest; R SFA - occlusive ds; L SFA - occlusive ds; patent fem-pop graft  . History of echocardiogram 08/27/2009   EF >55%  . History of kidney stones 1965  . History of nuclear stress test 11/24/2011   lexiscan; normal perfusion; low risk scan; non-diagnostic for ischemia  . Hyperlipidemia   . Hypertension   . Left carotid artery stenosis 04/08/2014  . Pulmonary nodule, right 04/08/2014   2.8 mm-incidental finding on CT  . PVD (peripheral vascular disease) (HCC)   . Tachyarrhythmia 1999   Status post ablation at [facility]  . Tobacco abuse     Past Surgical History:  Procedure Laterality Date  . BIOPSY  10/24/2017   Procedure: BIOPSY;  Surgeon: Corbin Ade, MD;  Location: [facility];  Service: Endoscopy;;  esophagus  . COLONOSCOPY  03/17/2009   Dr.Rourk- normal rectum, sigmoid diverticula, some pale sigmoid mucosa with diffuse petechiae. pedunculated polyp at the splenic flexure, remainder of colonic mucosa appeared normal. bx= adenomatous polyp  . COLONOSCOPY  04/19/2003   Dr.Rehman- few  diverticu;a at the sigmoid colon, 3 small polyps, one at the transverse colon and 2 at the sigmoid, small external hemorrhoids. bx report not available.   . COLONOSCOPY WITH PROPOFOL N/A 10/24/2017   Dr. Jena Gauss: Diverticulosis, 11 mm polyp at the ileocecal valve, tubular adenoma.  Repeat colonoscopy in 3 years  . CORONARY ARTERY BYPASS GRAFT  1998   [name]  . ESOPHAGECTOMY  2010   [facility] Dr. [name]  . ESOPHAGOGASTRODUODENOSCOPY  11/25/2010   Dr.Rourk- s/p esophagectomy with gastric pull-up, esophageal erosions straddling the surgical anastomosis, salmon colored epithelium coming up a good centimeter to a centimeter and a half above the suture line, islands of salmon colored epithelium in the most poximal residual esophagus, remainder of gastric mucosa appeared normal. bx= swamous &gastric glandular mucosa w/chronic active inflammation  . ESOPHAGOGASTRODUODENOSCOPY  03/17/2009   Dr.Rourk- 4cm segment of salmon-colored epithlium distal esophagus suspicious for barretts esophagus. area of suspicious nodularity w/in this segment bx seperately. small to moderate size hiatal hernia, o/w normal stomach D1 and D2 bx=adenocarcinoma  . ESOPHAGOGASTRODUODENOSCOPY (EGD) WITH PROPOFOL N/A 10/24/2017   Dr. Jena Gauss: Remnant of esophagus with anastomosis with stomach at 24 cm from the incisors, 2 cm above the anastomosis he was found to have Barrett's but no dysplasia.  Advised for repeat EGD in 3 years  . FEMORAL-POPLITEAL BYPASS GRAFT  1993   occlusive ds in R SFA  . GASTRIC PULL THROUGH  2010   With esophagectomy  . LIPOMA EXCISION  2010  . POLYPECTOMY  10/24/2017   Procedure: POLYPECTOMY;  Surgeon: Corbin Ade, MD;  Location: [facility];  Service: Endoscopy;;  colon   . TOTAL HIP ARTHROPLASTY Left 01/30/2019   Procedure: TOTAL HIP ARTHROPLASTY ANTERIOR APPROACH;  Surgeon: Sheral Apley, MD;  Location: [facility];  Service: Orthopedics;  Laterality: Left;    Allergies  Allergen Reactions  . Altace  [Ramipril] Cough    Current Outpatient Medications  Medication Sig Dispense Refill  . acetaminophen (TYLENOL) 500 MG tablet Take 500 mg by mouth daily as needed for headache.    . albuterol (PROVENTIL) (2.5 MG/3ML) 0.083% nebulizer solution Take 3 mLs (2.5 mg total) by nebulization every 6 (six) hours as needed for wheezing or shortness of breath. DX: J44.9 75 mL 6  . ALPRAZolam (XANAX) 0.5 MG tablet Take 1 tablet (0.5 mg total) by mouth at bedtime.  30 tablet 0  . amLODipine (NORVASC) 10 MG tablet Take 1 tablet (10 mg total) by mouth daily. 90 tablet 3  . aspirin EC 81 MG tablet Take 1 tablet (81 mg total) by mouth 2 (two) times daily. For DVT prophylaxis for 30 days after surgery. (Patient taking differently: Take 81 mg by mouth daily.) 60 tablet 0  . baclofen (LIORESAL) 10 MG tablet Take 1 tablet (10 mg total) by mouth 3 (three) times daily as needed for muscle spasms. 20 each 0  . brimonidine (ALPHAGAN) 0.2 % ophthalmic solution Place 1 drop into the right eye at bedtime.     . clopidogrel (PLAVIX) 75 MG tablet TAKE 1 TABLET BY MOUTH EVERY DAY 90 tablet 3  . DEXILANT 60 MG capsule TAKE ONE CAPSULE ($RemoveBefo'60MG'[id number]$  TOTAL) BY MOUTH DAILY BEFORE SUPPER (Patient taking differently: Take 60 mg by mouth as needed.) 30 capsule 5  . diclofenac sodium (VOLTAREN) 1 % GEL Apply 2 g topically 4 (four) times daily. Rub into affected area of foot 2 to 4 times daily (Patient taking differently: Apply 2 g topically 4 (four) times daily as needed (pain). Rub into affected area of foot 2 to 4 times daily) 100 g 2  . ezetimibe (ZETIA) 10 MG tablet Take 1 tablet (10 mg total) by mouth daily. 30 tablet 2  . Glycopyrrolate-Formoterol (BEVESPI AEROSPHERE) 9-4.8 MCG/ACT AERO Inhale 2 puffs into the lungs 2 (two) times daily. 5.9 g 6  . guaiFENesin (MUCINEX) 600 MG 12 hr tablet Take 1,200 mg by mouth every 12 (twelve) hours as needed (cough).     . latanoprost (XALATAN) 0.005 % ophthalmic solution Place 1 drop into both eyes at  bedtime.     Marland Kitchen losartan (COZAAR) 100 MG tablet TAKE ONE TABLET BY MOUTH EVERY NIGHT AT BEDTIME 90 tablet 3  . metoprolol succinate (TOPROL-XL) 50 MG 24 hr tablet TAKE ONE (1) TABLET BY MOUTH EVERY DAY 90 tablet 3  . montelukast (SINGULAIR) 10 MG tablet Take 1 tablet (10 mg total) by mouth daily. 90 tablet 6  . Multiple Vitamins-Minerals (CENTRUM SILVER PO) Take 1 tablet by mouth daily.    . nitroGLYCERIN (NITROSTAT) 0.4 MG SL tablet Place 1 tablet (0.4 mg total) under the tongue every 5 (five) minutes as needed for chest pain. 25 tablet 3  . rosuvastatin (CRESTOR) 10 MG tablet TAKE ONE (1) TABLET BY MOUTH EVERY DAY 90 tablet 3  . spironolactone (ALDACTONE) 25 MG tablet Take 0.5 tablets (12.5 mg total) by mouth daily. 45 tablet 3   No current facility-administered medications for this visit.    Socially he is widowed has 2 children. He quit smoking in 1997.  ROS General: Negative; No fevers, chills, or night sweats;  HEENT: Negative; No changes in vision or hearing, sinus congestion, difficulty swallowing Pulmonary: Positive for  COPD , pulmonary nodule; and recent pneumonia. Cardiovascular: Negative; No chest pain, presyncope, syncope, palpitations Status post femoropopliteal surgery 1993 positive for progressive claudication right leg leg greater than left lower extremity GI: Positive for GERD; No nausea, vomiting, diarrhea, or abdominal pain GU: Negative; No dysuria, hematuria, or difficulty voiding Musculoskeletal: Negative; no myalgias, joint pain, or weakness Hematologic/Oncology: Negative; no easy bruising, bleeding Endocrine: Negative; no heat/cold intolerance; no diabetes Neuro: Negative; no changes in balance, headaches Skin: Negative; No rashes or skin lesions Psychiatric: Negative; No behavioral problems, depression Sleep: Negative; No snoring, daytime sleepiness, hypersomnolence, bruxism, restless legs, hypnogognic hallucinations, no cataplexy   PE BP 132/68   Pulse 78  Ht '5\' 8"'$  (1.727 m)   Wt 160 lb (72.6 kg)   SpO2 95%   BMI 24.33 kg/m    Blood pressure by me 124/64  Wt Readings from Last 3 Encounters:  07/03/20 160 lb (72.6 kg)  04/30/20 158 lb (71.7 kg)  04/09/20 160 lb 6.4 oz (72.8 kg)    General: Alert, oriented, no distress.  Skin: normal turgor, no rashes, warm and dry HEENT: Normocephalic, atraumatic. Pupils equal round and reactive to light; sclera anicteric; extraocular muscles intact;  Nose without nasal septal hypertrophy Mouth/Parynx benign; Mallinpatti scale 3 Neck: No JVD, no carotid bruits; normal carotid upstroke Lungs: clear to ausculatation and percussion; no wheezing or rales Chest wall: without tenderness to palpitation Heart: PMI not displaced, RRR, s1 s2 normal, 1/6 systolic murmur, no diastolic murmur, no rubs, gallops, thrills, or heaves Abdomen: soft, nontender; no hepatosplenomehaly, BS+; abdominal aorta nontender and not dilated by palpation. Back: no CVA tenderness Pulses: Bilateral femoral bruits decreased dorsalis pedal pulses.  Upper extremity 2+ Musculoskeletal: full range of motion, normal strength, no joint deformities Extremities: no clubbing cyanosis or edema, Homan's sign negative  Neurologic: grossly nonfocal; Cranial nerves grossly wnl Psychologic: Normal mood and affect   ECG (independently read by me): NSR at 78; RBBB with repolarization  September 2021 ECG (independently read by me): Normal sinus rhythm at 66 bpm, right bundle branch block with repolarization changes.  QTc interval 450 ms.  January 2021 ECG (independently read by me): Sinus rhythm at 75 bpm, right bundle branch block with repolarization changes, borderline first-degree AV block with a PR normal at 202 ms.  QTc interval 455 ms.  December 2019 ECG (independently read by me): Normal sinus rhythm at 72 bpm.  Right bundle branch block with repolarization changes.  No ectopy.  April 2018 ECG (independently read by me): Normal sinus rhythm  at 65 bpm.  Right bundle-branch block with repolarization changes.  Borderline first-degree AV block with a PR interval 22 ms.  06/23/2016 ECG (independently read by me): normal sinus rhythm at 86 bpm.  Right bundle branch block with repolarization changes.  Small Q wave inferiorly.  November 2016 ECG (independently read by me):  Normal sinus rhythm with right bundle branch block and repolarization changes. First-degree AV block with PR interval of 210 ms.  March 2016 ECG (independently read by me): Normal sinus rhythm at 64 bpm.  Right bundle branch block with repolarization changes.  First-degree AV block with a PR interval of 228 ms.  September 2015 ECG (independently read by me): Sinus rhythm with first-degree AV block.  PR interval 214 ms.  Right bundle branch block with repolarization changes.  January 2015 ECG (independently read by me): Sinus rhythm 86 beats per minute. PR interval 200 ms.  Prior ECG of July 2014 :Sinus rhythm at 70 beats per minute. First degree AV block. 1 isolated unifocal PVC.  LABS:  BMP Latest Ref Rng & Units 01/22/2019 10/19/2017 06/02/2016  Glucose 70 - 99 mg/dL 113(H) 142(H) 115(H)  BUN 8 - 23 mg/dL $Remove'16 13 10  'ojEYhEp$ Creatinine 0.61 - 1.24 mg/dL 0.83 0.82 0.85  Sodium 135 - 145 mmol/L 143 139 137  Potassium 3.5 - 5.1 mmol/L 4.8 4.2 4.0  Chloride 98 - 111 mmol/L 110 105 104  CO2 22 - 32 mmol/L 26 21(L) 25  Calcium 8.9 - 10.3 mg/dL 9.5 9.1 8.6(L)    Hepatic Function Latest Ref Rng & Units 05/25/2016 04/07/2014 03/15/2014  Total Protein 6.5 - 8.1 g/dL 6.8 7.7 6.9  Albumin 3.5 - 5.0 g/dL 3.1(L) 4.1 3.7  AST 15 - 41 U/L 14(L) 20 15  ALT 17 - 63 U/L 13(L) 19 15  Alk Phosphatase 38 - 126 U/L 63 89 77  Total Bilirubin 0.3 - 1.2 mg/dL 0.9 0.5 0.5    CBC Latest Ref Rng & Units 01/22/2019 10/19/2017 06/02/2016  WBC 4.0 - 10.5 K/uL 7.9 6.4 14.6(H)  Hemoglobin 13.0 - 17.0 g/dL 14.9 14.6 13.3  Hematocrit 39.0 - 52.0 % 46.7 44.1 40.1  Platelets 150 - 400 K/uL 312 282 531(H)     Lab Results  Component Value Date   MCV 94.0 01/22/2019   MCV 92.3 10/19/2017   MCV 91.8 06/02/2016   Lab Results  Component Value Date   TSH 1.850 04/07/2014    BNP    Component Value Date/Time   PROBNP 190.6 (H) 04/07/2014 1600    Lipid Panel     Component Value Date/Time   CHOL 96 01/31/2013 0940   TRIG 78 01/31/2013 0940   HDL 37 (L) 01/31/2013 0940   CHOLHDL 2.6 01/31/2013 0940   VLDL 16 01/31/2013 0940   LDLCALC 43 01/31/2013 0940     RADIOLOGY: No results found.  IMPRESSION:  1. Coronary artery disease involving native coronary artery of native heart without angina pectoris   2. Hx of CABG   3. Peripheral vascular disease ([place])   4. Hyperlipidemia with target LDL less than 70   5. Left carotid artery stenosis   6. Claudication in peripheral vascular disease [facility])     ASSESSMENT AND PLAN: Gregory Eaton is a 74 year old gentleman who has coronary as well as peripheral vascular disease.  He is s/p CABG revascularization surgery in 1998 and underwent femoropopliteal bypass surgery in 1993.  A nuclear perfusion study in 2015 suggested  patency of his grafts and this was without scar or ischemia. His CAD appears stable and he is not having anginal symptoms on current therapy. His carotid ultrasound  on 04/07/2014 showed bilateral atherosclerotic plaque in the carotid bulb and internal carotid arteries with focal 60-69% stenosis in the left internal carotid and he had preocclusive narrowing in the left external carotid.  His left vertebral artery was not visualized.   His 2019 lower extremity Doppler revealed noncompressible arteries with abnormal toe brachial index but on the most recent study the right ABI was slightly increased from 1 year previously.  His  carotid studies in September 2020 showed 1 to 39% stenosis in the right ICA, nonhemodynamic significant plaque of less than 50% in the common carotid artery.  The left carotid had 40 to 59% ICA stenosis.  The right  vertebral artery had normal antegrade flow.  The left vertebral artery was occluded.  There was stenosis noted in the left subclavian artery.  Carotid studies in September 2021 showed 1 to 39% stenosis in the right ICA and 40 to 59% in the left.  He has noticed slight increase in claudication symptoms involving his thighs and calves right greater than left.  I reviewed his most recent lower extremity duplex imaging which suggests slight progression in his right without significant change in his left lower extremity.  His blood pressure today is now stable on his current medical regimen.  His shortness of breath has improved as well with the addition of spironolactone.  He continues to be on DAPT with aspirin/Plavix.  When I last saw him I added Zetia to his rosuvastatin since in the past he develop myalgias on higher dose statin.  Target LDL is less than 70.  He will be seeing Dr. Wende Neighbors who will be rechecking laboratory.  I will see him in 6 months for reevaluation or sooner as needed.    Troy Sine, MD, [facility]  07/05/2020 5:30 PM

## 2020-07-05 ENCOUNTER — Encounter: Payer: Self-pay | Admitting: Cardiovascular Disease

## 2020-07-09 DIAGNOSIS — M25562 Pain in left knee: Secondary | ICD-10-CM | POA: Diagnosis not present

## 2020-07-09 DIAGNOSIS — M545 Low back pain, unspecified: Secondary | ICD-10-CM | POA: Diagnosis not present

## 2020-07-18 DIAGNOSIS — K219 Gastro-esophageal reflux disease without esophagitis: Secondary | ICD-10-CM | POA: Diagnosis not present

## 2020-07-18 DIAGNOSIS — E782 Mixed hyperlipidemia: Secondary | ICD-10-CM | POA: Diagnosis not present

## 2020-07-18 DIAGNOSIS — E785 Hyperlipidemia, unspecified: Secondary | ICD-10-CM | POA: Diagnosis not present

## 2020-07-18 DIAGNOSIS — I10 Essential (primary) hypertension: Secondary | ICD-10-CM | POA: Diagnosis not present

## 2020-07-23 DIAGNOSIS — M25552 Pain in left hip: Secondary | ICD-10-CM | POA: Diagnosis not present

## 2020-08-11 ENCOUNTER — Other Ambulatory Visit: Payer: Self-pay | Admitting: Cardiovascular Disease

## 2020-08-13 DIAGNOSIS — M545 Low back pain, unspecified: Secondary | ICD-10-CM | POA: Diagnosis not present

## 2020-08-13 DIAGNOSIS — Q762 Congenital spondylolisthesis: Secondary | ICD-10-CM | POA: Diagnosis not present

## 2020-08-13 DIAGNOSIS — M48062 Spinal stenosis, lumbar region with neurogenic claudication: Secondary | ICD-10-CM | POA: Diagnosis not present

## 2020-08-14 ENCOUNTER — Other Ambulatory Visit: Payer: Self-pay | Admitting: Orthopaedic Surgery

## 2020-08-14 DIAGNOSIS — M48062 Spinal stenosis, lumbar region with neurogenic claudication: Secondary | ICD-10-CM

## 2020-08-16 DIAGNOSIS — J449 Chronic obstructive pulmonary disease, unspecified: Secondary | ICD-10-CM | POA: Diagnosis not present

## 2020-08-16 DIAGNOSIS — I252 Old myocardial infarction: Secondary | ICD-10-CM | POA: Diagnosis not present

## 2020-08-16 DIAGNOSIS — I2510 Atherosclerotic heart disease of native coronary artery without angina pectoris: Secondary | ICD-10-CM | POA: Diagnosis not present

## 2020-08-16 DIAGNOSIS — K219 Gastro-esophageal reflux disease without esophagitis: Secondary | ICD-10-CM | POA: Diagnosis not present

## 2020-08-16 DIAGNOSIS — H9201 Otalgia, right ear: Secondary | ICD-10-CM | POA: Diagnosis not present

## 2020-08-16 DIAGNOSIS — E782 Mixed hyperlipidemia: Secondary | ICD-10-CM | POA: Diagnosis not present

## 2020-08-16 DIAGNOSIS — E785 Hyperlipidemia, unspecified: Secondary | ICD-10-CM | POA: Diagnosis not present

## 2020-08-16 DIAGNOSIS — R1013 Epigastric pain: Secondary | ICD-10-CM | POA: Diagnosis not present

## 2020-08-16 DIAGNOSIS — I10 Essential (primary) hypertension: Secondary | ICD-10-CM | POA: Diagnosis not present

## 2020-08-16 DIAGNOSIS — Z0001 Encounter for general adult medical examination with abnormal findings: Secondary | ICD-10-CM | POA: Diagnosis not present

## 2020-08-20 DIAGNOSIS — H42 Glaucoma in diseases classified elsewhere: Secondary | ICD-10-CM | POA: Insufficient documentation

## 2020-08-20 DIAGNOSIS — F411 Generalized anxiety disorder: Secondary | ICD-10-CM | POA: Insufficient documentation

## 2020-08-20 DIAGNOSIS — J302 Other seasonal allergic rhinitis: Secondary | ICD-10-CM | POA: Insufficient documentation

## 2020-08-20 DIAGNOSIS — G2581 Restless legs syndrome: Secondary | ICD-10-CM | POA: Insufficient documentation

## 2020-08-20 DIAGNOSIS — K2100 Gastro-esophageal reflux disease with esophagitis, without bleeding: Secondary | ICD-10-CM | POA: Insufficient documentation

## 2020-08-20 DIAGNOSIS — J410 Simple chronic bronchitis: Secondary | ICD-10-CM | POA: Insufficient documentation

## 2020-08-20 DIAGNOSIS — K635 Polyp of colon: Secondary | ICD-10-CM | POA: Insufficient documentation

## 2020-08-31 ENCOUNTER — Ambulatory Visit
Admission: RE | Admit: 2020-08-31 | Discharge: 2020-08-31 | Disposition: A | Discharge status: Home / Self Care | Payer: Medicare Other | Source: Ambulatory Visit | Attending: Orthopaedic Surgery | Admitting: Orthopaedic Surgery

## 2020-08-31 DIAGNOSIS — M48062 Spinal stenosis, lumbar region with neurogenic claudication: Secondary | ICD-10-CM

## 2020-09-11 ENCOUNTER — Encounter: Payer: Self-pay | Admitting: Internal Medicine

## 2020-09-25 ENCOUNTER — Encounter: Payer: Self-pay | Admitting: Internal Medicine

## 2020-10-30 ENCOUNTER — Other Ambulatory Visit: Payer: Self-pay | Admitting: Cardiovascular Disease

## 2020-11-05 ENCOUNTER — Ambulatory Visit: Payer: Medicare Other | Admitting: Pulmonary Disease

## 2020-11-05 ENCOUNTER — Encounter: Payer: Self-pay | Admitting: Pulmonary Disease

## 2020-11-05 ENCOUNTER — Other Ambulatory Visit: Payer: Self-pay

## 2020-11-05 DIAGNOSIS — J449 Chronic obstructive pulmonary disease, unspecified: Secondary | ICD-10-CM | POA: Diagnosis not present

## 2020-11-05 MED ORDER — ALBUTEROL SULFATE (2.5 MG/3ML) 0.083% IN NEBU: 2.5000 mg | INHALATION_SOLUTION | Freq: Four times a day (QID) | RESPIRATORY_TRACT | 6 refills | Status: DC | PRN

## 2020-11-05 MED ORDER — MONTELUKAST SODIUM 10 MG PO TABS: 10.0000 mg | ORAL_TABLET | Freq: Every day | ORAL | 4 refills | Status: DC

## 2020-11-05 MED ORDER — BEVESPI AEROSPHERE 9-4.8 MCG/ACT IN AERO: 2.0000 | INHALATION_SPRAY | Freq: Two times a day (BID) | RESPIRATORY_TRACT | 6 refills | Status: DC

## 2020-11-05 NOTE — Progress Notes (Signed)
Subjective:   PATIENT ID: Gregory Eaton GENDER: male DOB: 1945-11-08, MRN: [id number]   HPI  Chief Complaint  Patient presents with  . Follow-up    refills    Reason for Visit: Follow-up   [name] is a 75 year old male former smoker with COPD (FEV1 59%), status post CABG, hypertension, PVD and history of esophageal cancer status post esophagectomy and gastric pull-through in 2010 who presents for follow-up.  11/05/20 He reports his COPD is very well controlled. Denies any recent exacerbations requiring steroids or antibiotics. No ED/urgent care visit. Compliant with his Bevespi. He has used his albuterol twice since our last visit six months ago. He still has shortness of breath when walking uphill but feels that his legs have decreased circulation that may be contributing. Denies cough and wheezing.  Social History: 60-pack-year smoking history. Quit in 1997 Previous employed as a Administrator, previously did Dealer work  Programme researcher, broadcasting/film/video exposures: Hx of Architect x 2 years  I have personally reviewed patient's past medical/family/social history/allergies/current medications.  Past Medical History:  Diagnosis Date  . Adenomatous colon polyp 03/2009   Last colonoscopy by Dr. [name]   . Adenomatous polyp 2010  . Adenomatous polyp of colon 11/03/2010  . Arthritis   . Barrett's esophagus   . CAD (coronary artery disease)   . Complication of anesthesia    has a shortened esophogus due to CA.   Marland Kitchen COPD (chronic obstructive pulmonary disease) (HCC)    Severe emphysema per CT  . Diverticulosis   . Esophageal carcinoma ([place]) 03/2009   T1N1M0  . GERD (gastroesophageal reflux disease)   . Glaucoma   . History of Doppler ultrasound 11/09/2011   03/2014- 50-69% L ICA stenosis;carotid doppler; L bulb/prox ICA 0-49% diameter reduction; L vertebral artery - occlusive ds; L ECA  demonstrates severe amount of fibrous plaque  . History of Doppler ultrasound 11/09/11   LEAs; R  ABI - mod art. insuff.; L ABI normal at rest; R SFA - occlusive ds; L SFA - occlusive ds; patent fem-pop graft  . History of echocardiogram 08/27/2009   EF >55%  . History of kidney stones 1965  . History of nuclear stress test 11/24/2011   lexiscan; normal perfusion; low risk scan; non-diagnostic for ischemia  . Hyperlipidemia   . Hypertension   . Left carotid artery stenosis 04/08/2014  . Pulmonary nodule, right 04/08/2014   2.8 mm-incidental finding on CT  . PVD (peripheral vascular disease) ([place])   . Tachyarrhythmia 1999   Status post ablation at [facility]  . Tobacco abuse     Outpatient Medications Prior to Visit  Medication Sig Dispense Refill  . acetaminophen (TYLENOL) 500 MG tablet Take 500 mg by mouth daily as needed for headache.    . albuterol (PROVENTIL) (2.5 MG/3ML) 0.083% nebulizer solution Take 3 mLs (2.5 mg total) by nebulization every 6 (six) hours as needed for wheezing or shortness of breath. DX: J44.9 75 mL 6  . ALPRAZolam (XANAX) 0.5 MG tablet Take 1 tablet (0.5 mg total) by mouth at bedtime. 30 tablet 0  . amLODipine (NORVASC) 10 MG tablet Take 1 tablet (10 mg total) by mouth daily. 90 tablet 3  . aspirin EC 81 MG tablet Take 1 tablet (81 mg total) by mouth 2 (two) times daily. For DVT prophylaxis for 30 days after surgery. (Patient taking differently: Take 81 mg by mouth daily.) 60 tablet 0  . baclofen (LIORESAL) 10 MG tablet Take 1 tablet (10 mg total)  by mouth 3 (three) times daily as needed for muscle spasms. 20 each 0  . brimonidine (ALPHAGAN) 0.2 % ophthalmic solution Place 1 drop into the right eye at bedtime.     . clopidogrel (PLAVIX) 75 MG tablet TAKE 1 TABLET BY MOUTH EVERY DAY 90 tablet 3  . DEXILANT 60 MG capsule TAKE ONE CAPSULE (60MG  TOTAL) BY MOUTH DAILY BEFORE SUPPER (Patient taking differently: Take 60 mg by mouth as needed.) 30 capsule 5  . diclofenac sodium (VOLTAREN) 1 % GEL Apply 2 g topically 4 (four) times daily. Rub into affected area of foot 2 to 4  times daily (Patient taking differently: Apply 2 g topically 4 (four) times daily as needed (pain). Rub into affected area of foot 2 to 4 times daily) 100 g 2  . ezetimibe (ZETIA) 10 MG tablet TAKE ONE (1) TABLET BY MOUTH EVERY DAY 30 tablet 2  . Glycopyrrolate-Formoterol (BEVESPI AEROSPHERE) 9-4.8 MCG/ACT AERO Inhale 2 puffs into the lungs 2 (two) times daily. 5.9 g 6  . guaiFENesin (MUCINEX) 600 MG 12 hr tablet Take 1,200 mg by mouth every 12 (twelve) hours as needed (cough).     . latanoprost (XALATAN) 0.005 % ophthalmic solution Place 1 drop into both eyes at bedtime.     Marland Kitchen losartan (COZAAR) 100 MG tablet TAKE ONE TABLET BY MOUTH EVERY NIGHT AT BEDTIME 90 tablet 3  . metoprolol succinate (TOPROL-XL) 50 MG 24 hr tablet TAKE ONE (1) TABLET BY MOUTH EVERY DAY 90 tablet 3  . montelukast (SINGULAIR) 10 MG tablet Take 1 tablet (10 mg total) by mouth daily. 90 tablet 6  . Multiple Vitamins-Minerals (CENTRUM SILVER PO) Take 1 tablet by mouth daily.    . nitroGLYCERIN (NITROSTAT) 0.4 MG SL tablet Place 1 tablet (0.4 mg total) under the tongue every 5 (five) minutes as needed for chest pain. 25 tablet 3  . rosuvastatin (CRESTOR) 10 MG tablet TAKE ONE (1) TABLET BY MOUTH EVERY DAY 90 tablet 3  . spironolactone (ALDACTONE) 25 MG tablet Take 0.5 tablets (12.5 mg total) by mouth daily. 45 tablet 3   No facility-administered medications prior to visit.    Review of Systems  Constitutional: Negative for chills, diaphoresis, fever, malaise/fatigue and weight loss.  HENT: Negative for congestion.   Respiratory: Positive for cough and wheezing. Negative for hemoptysis, sputum production and shortness of breath.   Cardiovascular: Negative for chest pain, palpitations and leg swelling.     Objective:   Vitals:   11/05/20 1036  BP: 112/60  Pulse: 71  Temp: (!) 97.5 F (36.4 C)  SpO2: 97%  Weight: 160 lb (72.6 kg)  Height: 5\' 8"  (1.727 m)      Physical Exam: General: Well-appearing, no acute  distress HENT: , AT Eyes: EOMI, no scleral icterus Respiratory: Clear to auscultation bilaterally.  No crackles, wheezing or rales Cardiovascular: RRR, -M/R/G, no JVD Extremities:-Edema,-tenderness Neuro: AAO x4, CNII-XII grossly intact Skin: Intact, no rashes or bruising Psych: Normal mood, normal affect  Data Reviewed:  Imaging: CT Chest 11/22/17 - Centrilobular and paraseptal emphysema. 43mm nodule in the RLL, unchanged since 2015  PFT: 03/23/2016 FVC 4.1 L (101 %) FEV1 1.8 (59 %) Ratio 58  Interpretation: Moderately severe obstructive defect.  Normal vital capacity.    Imaging, labs and test noted above have been reviewed independently by me.  Assessment & Plan:   Discussion: 75 year old male with moderately severe COPD, hx esophageal CA s/p esophagectomy , hx CABG and PVD on DAPT and stable  RLL nodule who presents for follow-up. We discussed action plan in event of exacerbation and steroids as needed. No exacerbation in >1 year. Could consider de-escalating to LAMA alone however doing well on his current inhaler with mMRC >2. No changes recommended to regimen  Moderately severe COPD (FEV1 59%): GOLD Class B, well-controlled --CONTINUE Bevespi TWO puffs TWICE a day. REFILL --CONTINUE montelukast 10 mg daily. REFILL --CONTINUE Albuterol as needed for shortness of breath or wheezing. REFILL --Previously provided Prednisone pack as needed. Patient will call office when he takes and if symptoms not improving  RLL lung nodule: Stable 85mm nodule, unchanged since 2015 No further imaging indicated  Health Maintenance Immunization History  Administered Date(s) Administered  . Influenza Inj Mdck Quad Pf 04/17/2016  . Influenza, High Dose Seasonal PF 05/09/2017, 04/21/2018, 04/19/2019  . Influenza,inj,Quad PF,6+ Mos 04/19/2015, 04/17/2016  . Influenza-Unspecified 05/29/2010, 05/01/2014  . Pneumococcal Conjugate-13 04/01/2014  . Pneumococcal Polysaccharide-23 04/17/2016  . Td  05/17/2012   UTD on COVID vaccinations x 3. Discussed vaccinations and plan for booster when up to date one is available.  CT Lung Screen - Not qualified  No orders of the defined types were placed in this encounter.  Meds ordered this encounter  Medications  . albuterol (PROVENTIL) (2.5 MG/3ML) 0.083% nebulizer solution    Sig: Take 3 mLs (2.5 mg total) by nebulization every 6 (six) hours as needed for wheezing or shortness of breath. DX: J44.9    Dispense:  75 mL    Refill:  6  . Glycopyrrolate-Formoterol (BEVESPI AEROSPHERE) 9-4.8 MCG/ACT AERO    Sig: Inhale 2 puffs into the lungs 2 (two) times daily.    Dispense:  5.9 g    Refill:  6    Order Specific Question:   Lot Number?    Answer:   [id number]    Order Specific Question:   Expiration Date?    Answer:   12/18/2019    Order Specific Question:   Manufacturer?    Answer:   [employer] [[id number]]    Order Specific Question:   Quantity    Answer:   1  . montelukast (SINGULAIR) 10 MG tablet    Sig: Take 1 tablet (10 mg total) by mouth daily.    Dispense:  90 tablet    Refill:  4   Return in about 6 months (around 05/07/2021).   I have spent a total time of 20-minutes on the day of the appointment reviewing prior documentation, coordinating care and discussing medical diagnosis and plan with the patient/family. Imaging, labs and tests included in this note have been reviewed and interpreted independently by me.   [place], MD [facility] 11/05/2020 8:52 AM  Office Number [phone]

## 2020-11-05 NOTE — Patient Instructions (Addendum)
Moderately severe COPD (FEV1 59%): GOLD Class B, well-controlled --CONTINUE Bevespi TWO puffs TWICE a day. REFILL --CONTINUE montelukast 10 mg daily. REFILL --CONTINUE Albuterol as needed for shortness of breath or wheezing. REFILL --Previously provided Prednisone pack as needed. Patient will call office when he takes and if symptoms not improving  Follow-up in 6 months

## 2020-11-11 DIAGNOSIS — G720 Drug-induced myopathy: Secondary | ICD-10-CM | POA: Insufficient documentation

## 2020-11-11 DIAGNOSIS — R7301 Impaired fasting glucose: Secondary | ICD-10-CM | POA: Insufficient documentation

## 2020-11-11 DIAGNOSIS — R351 Nocturia: Secondary | ICD-10-CM | POA: Insufficient documentation

## 2020-11-14 ENCOUNTER — Other Ambulatory Visit: Payer: Self-pay

## 2020-11-14 ENCOUNTER — Ambulatory Visit: Payer: Medicare Other | Admitting: Gastroenterology

## 2020-11-14 ENCOUNTER — Telehealth: Payer: Self-pay

## 2020-11-14 ENCOUNTER — Encounter: Payer: Self-pay | Admitting: Gastroenterology

## 2020-11-14 VITALS — BP 108/63 | HR 56 | Temp 98.2°F | Ht 68.0 in | Wt 158.4 lb

## 2020-11-14 DIAGNOSIS — K2270 Barrett's esophagus without dysplasia: Secondary | ICD-10-CM

## 2020-11-14 DIAGNOSIS — Z86010 Personal history of colonic polyps: Secondary | ICD-10-CM | POA: Diagnosis not present

## 2020-11-14 NOTE — Progress Notes (Signed)
Primary Care Physician:  Curly Rim, MD  Primary Gastroenterologist:  Garfield Cornea, MD   Chief Complaint  Patient presents with  . due for TCS/EGD  . Abdominal Pain    "discomfort" since 2010 surgery    HPI:  Gregory Eaton is a 75 y.o. male here to schedule surveillance EGD and colonoscopy.  Patient has a history of remote esophageal cancer in 2010 underwent surgical resection.  Considered cured.  EGD in April 2019 showed previous esophagectomy with remnant of esophagus, anastomosis with stomach at 24 cm from the incisors.  Centimeters above the anastomosis, suspicious for Barrett's, confirmed with biopsy, no dysplasia.  Advised repeat EGD in 3 years.  He also had a colonoscopy for personal history of adenomatous colon polyps.  Noted to have multiple diverticula, 11 mm polyp in the ileocecal valve with tubular adenoma.  Advised to repeat colonoscopy in 3 years.  Overall doing well.  Chronically, bowel movements inconsistent. BM 2-3 times per week. Some abdominal pain just prior to BM. Doesn't feel very productive at time. But will use Miralax when needed with good results, generally takes 2-3 times per week.  Does not want to take daily because tends to develop urgency/loose stools.  Occasionally sees some bright red blood with straining, he believes is related to hemorrhoids. No melena. No dysphagia. Heartburn fairly well controlled.  Has the head of the bed elevated 10 inches.  Does not have any nocturnal symptoms but when he gets up in the morning he has to spit a lot of secretions up.  Denies any significant abdominal pain, chronically has had some discomfort since his surgery in 2010 for esophageal cancer.   Current Outpatient Medications  Medication Sig Dispense Refill  . acetaminophen (TYLENOL) 500 MG tablet Take 500 mg by mouth daily as needed for headache.    . albuterol (PROVENTIL) (2.5 MG/3ML) 0.083% nebulizer solution Take 3 mLs (2.5 mg total) by nebulization every 6 (six)  hours as needed for wheezing or shortness of breath. DX: J44.9 75 mL 6  . ALPRAZolam (XANAX) 0.5 MG tablet Take 1 tablet (0.5 mg total) by mouth at bedtime. 30 tablet 0  . amLODipine (NORVASC) 10 MG tablet Take 1 tablet (10 mg total) by mouth daily. 90 tablet 3  . aspirin EC 81 MG tablet Take 1 tablet (81 mg total) by mouth 2 (two) times daily. For DVT prophylaxis for 30 days after surgery. (Patient taking differently: Take 81 mg by mouth daily.) 60 tablet 0  . baclofen (LIORESAL) 10 MG tablet Take 1 tablet (10 mg total) by mouth 3 (three) times daily as needed for muscle spasms. 20 each 0  . brimonidine (ALPHAGAN) 0.2 % ophthalmic solution Place 1 drop into the right eye at bedtime.     . clopidogrel (PLAVIX) 75 MG tablet TAKE 1 TABLET BY MOUTH EVERY DAY 90 tablet 3  . DEXILANT 60 MG capsule TAKE ONE CAPSULE (60MG  TOTAL) BY MOUTH DAILY BEFORE SUPPER (Patient taking differently: Take 60 mg by mouth as needed (takes 2-3 times per week).) 30 capsule 5  . diclofenac sodium (VOLTAREN) 1 % GEL Apply 2 g topically 4 (four) times daily. Rub into affected area of foot 2 to 4 times daily (Patient taking differently: Apply 2 g topically 4 (four) times daily as needed (pain). Rub into affected area of foot 2 to 4 times daily) 100 g 2  . ezetimibe (ZETIA) 10 MG tablet TAKE ONE (1) TABLET BY MOUTH EVERY DAY 30 tablet 2  .  Glycopyrrolate-Formoterol (BEVESPI AEROSPHERE) 9-4.8 MCG/ACT AERO Inhale 2 puffs into the lungs 2 (two) times daily. 5.9 g 6  . guaiFENesin (MUCINEX) 600 MG 12 hr tablet Take 1,200 mg by mouth every 12 (twelve) hours as needed (cough).     . latanoprost (XALATAN) 0.005 % ophthalmic solution Place 1 drop into both eyes at bedtime.     Marland Kitchen losartan (COZAAR) 100 MG tablet TAKE ONE TABLET BY MOUTH EVERY NIGHT AT BEDTIME 90 tablet 3  . Meloxicam (MOBIC PO) Take by mouth daily as needed. Takes 2-3 times per week    . metoprolol succinate (TOPROL-XL) 50 MG 24 hr tablet TAKE ONE (1) TABLET BY MOUTH EVERY  DAY 90 tablet 3  . montelukast (SINGULAIR) 10 MG tablet Take 1 tablet (10 mg total) by mouth daily. 90 tablet 4  . Multiple Vitamins-Minerals (CENTRUM SILVER PO) Take 1 tablet by mouth daily.    . nitroGLYCERIN (NITROSTAT) 0.4 MG SL tablet Place 1 tablet (0.4 mg total) under the tongue every 5 (five) minutes as needed for chest pain. 25 tablet 3  . rosuvastatin (CRESTOR) 10 MG tablet TAKE ONE (1) TABLET BY MOUTH EVERY DAY 90 tablet 3  . spironolactone (ALDACTONE) 25 MG tablet Take 0.5 tablets (12.5 mg total) by mouth daily. 45 tablet 3   No current facility-administered medications for this visit.    Allergies as of 11/14/2020 - Review Complete 11/14/2020  Allergen Reaction Noted  . Altace [ramipril] Cough 04/07/2014    Past Medical History:  Diagnosis Date  . Adenomatous colon polyp 03/2009   Last colonoscopy by Dr. [name]   . Adenomatous polyp 2010  . Adenomatous polyp of colon 11/03/2010  . Arthritis   . Barrett's esophagus   . CAD (coronary artery disease)   . Complication of anesthesia    has a shortened esophogus due to CA.   Marland Kitchen COPD (chronic obstructive pulmonary disease) (HCC)    Severe emphysema per CT  . Diverticulosis   . Esophageal carcinoma ([place]) 03/2009   T1N1M0  . GERD (gastroesophageal reflux disease)   . Glaucoma   . History of Doppler ultrasound 11/09/2011   03/2014- 50-69% L ICA stenosis;carotid doppler; L bulb/prox ICA 0-49% diameter reduction; L vertebral artery - occlusive ds; L ECA  demonstrates severe amount of fibrous plaque  . History of Doppler ultrasound 11/09/11   LEAs; R ABI - mod art. insuff.; L ABI normal at rest; R SFA - occlusive ds; L SFA - occlusive ds; patent fem-pop graft  . History of echocardiogram 08/27/2009   EF >55%  . History of kidney stones 1965  . History of nuclear stress test 11/24/2011   lexiscan; normal perfusion; low risk scan; non-diagnostic for ischemia  . Hyperlipidemia   . Hypertension   . Left carotid artery stenosis 04/08/2014   . Pulmonary nodule, right 04/08/2014   2.8 mm-incidental finding on CT  . PVD (peripheral vascular disease) ([place])   . Tachyarrhythmia 1999   Status post ablation at [facility]  . Tobacco abuse     Past Surgical History:  Procedure Laterality Date  . BIOPSY  10/24/2017   Procedure: BIOPSY;  Surgeon: [name], MD;  Location: [facility];  Service: Endoscopy;;  esophagus  . COLONOSCOPY  03/17/2009   Dr.Rourk- normal rectum, sigmoid diverticula, some pale sigmoid mucosa with diffuse petechiae. pedunculated polyp at the splenic flexure, remainder of colonic mucosa appeared normal. bx= adenomatous polyp  . COLONOSCOPY  04/19/2003   Dr.Rehman- few diverticu;a at the sigmoid colon,  3 small polyps, one at the transverse colon and 2 at the sigmoid, small external hemorrhoids. bx report not available.   . COLONOSCOPY WITH PROPOFOL N/A 10/24/2017   Dr. [name]: Diverticulosis, 11 mm polyp at the ileocecal valve, tubular adenoma.  Repeat colonoscopy in 3 years  . CORONARY ARTERY BYPASS GRAFT  1998   [name]  . ESOPHAGECTOMY  2010   [facility] Dr. [name]  . ESOPHAGOGASTRODUODENOSCOPY  11/25/2010   Dr.Rourk- s/p esophagectomy with gastric pull-up, esophageal erosions straddling the surgical anastomosis, salmon colored epithelium coming up a good centimeter to a centimeter and a half above the suture line, islands of salmon colored epithelium in the most poximal residual esophagus, remainder of gastric mucosa appeared normal. bx= swamous &gastric glandular mucosa w/chronic active inflammation  . ESOPHAGOGASTRODUODENOSCOPY  03/17/2009   Dr.Rourk- 4cm segment of salmon-colored epithlium distal esophagus suspicious for barretts esophagus. area of suspicious nodularity w/in this segment bx seperately. small to moderate size hiatal hernia, o/w normal stomach D1 and D2 bx=adenocarcinoma  . ESOPHAGOGASTRODUODENOSCOPY (EGD) WITH PROPOFOL N/A 10/24/2017   Dr. [name]: Remnant of esophagus with anastomosis with stomach  at 24 cm from the incisors, 2 cm above the anastomosis he was found to have Barrett's but no dysplasia.  Advised for repeat EGD in 3 years  . FEMORAL-POPLITEAL BYPASS GRAFT  1993   occlusive ds in R SFA  . GASTRIC PULL THROUGH  2010   With esophagectomy  . LIPOMA EXCISION  2010  . POLYPECTOMY  10/24/2017   Procedure: POLYPECTOMY;  Surgeon: [name], MD;  Location: [facility];  Service: Endoscopy;;  colon   . TOTAL HIP ARTHROPLASTY Left 01/30/2019   Procedure: TOTAL HIP ARTHROPLASTY ANTERIOR APPROACH;  Surgeon: Renette Butters, MD;  Location: [facility];  Service: Orthopedics;  Laterality: Left;    Family History  Problem Relation Age of Onset  . GER disease Mother   . Coronary artery disease Brother   . Congenital heart disease Sister   . Colon cancer Neg Hx     Social History   Socioeconomic History  . Marital status: Married    Spouse name: Not on file  . Number of children: 2  . Years of education: Not on file  . Highest education level: Not on file  Occupational History  . Occupation: retired    Fish farm manager: RETIRED    Comment: truck Geophysicist/field seismologist  Tobacco Use  . Smoking status: Former Smoker    Packs/day: 2.00    Years: 40.00    Pack years: 80.00    Types: Cigarettes    Start date: 70    Quit date: 07/20/1995    Years since quitting: 25.3  . Smokeless tobacco: Never Used  Vaping Use  . Vaping Use: Never used  Substance and Sexual Activity  . Alcohol use: No    Alcohol/week: 0.0 standard drinks  . Drug use: No  . Sexual activity: Yes    Partners: Female    Birth control/protection: Condom    Comment: friend  Other Topics Concern  . Not on file  Social History Narrative  . Not on file   Social Determinants of Health   Financial Resource Strain: Not on file  Food Insecurity: Not on file  Transportation Needs: Not on file  Physical Activity: Not on file  Stress: Not on file  Social Connections: Not on file  Intimate Partner Violence: Not on file       ROS:  General: Negative for anorexia, weight loss, fever, chills,  fatigue, weakness. Eyes: Negative for vision changes.  ENT: Negative for hoarseness, difficulty swallowing , nasal congestion. CV: Negative for chest pain, angina, palpitations, dyspnea on exertion, peripheral edema.  Respiratory: Negative for dyspnea at rest, dyspnea on exertion, cough, sputum, wheezing.  GI: See history of present illness. GU:  Negative for dysuria, hematuria, urinary incontinence, urinary frequency, nocturnal urination.  MS: Positive for joint pain.  Negative low back pain.  Derm: Negative for rash or itching.  Neuro: Negative for weakness, abnormal sensation, seizure, frequent headaches, memory loss, confusion.  Psych: Negative for anxiety, depression, suicidal ideation, hallucinations.  Endo: Negative for unusual weight change.  Heme: Negative for bruising or bleeding. Allergy: Negative for rash or hives.    Physical Examination:  BP 108/63   Pulse (!) 56   Temp 98.2 F (36.8 C)   Ht 5\' 8"  (1.727 m)   Wt 158 lb 6.4 oz (71.8 kg)   BMI 24.08 kg/m    General: Well-nourished, well-developed in no acute distress.  Head: Normocephalic, atraumatic.   Eyes: Conjunctiva pink, no icterus. Mouth:masked. Neck: Supple without thyromegaly, masses, or lymphadenopathy.  Lungs: Clear to auscultation bilaterally.  Heart: Regular rate and rhythm, no murmurs rubs or gallops.  Abdomen: Bowel sounds are normal, nontender, nondistended, no hepatosplenomegaly or masses, no abdominal bruits or    hernia , no rebound or guarding.   Rectal: not performed Extremities: No lower extremity edema. No clubbing or deformities.  Neuro: Alert and oriented x 4 , grossly normal neurologically.  Skin: Warm and dry, no rash or jaundice.   Psych: Alert and cooperative, normal mood and affect.  Labs: Lab Results  Component Value Date   CREATININE 0.83 01/22/2019   BUN 16 01/22/2019   NA 143 01/22/2019   K 4.8 01/22/2019    CL 110 01/22/2019   CO2 26 01/22/2019    Lab Results  Component Value Date   WBC 7.9 01/22/2019   HGB 14.9 01/22/2019   HCT 46.7 01/22/2019   MCV 94.0 01/22/2019   PLT 312 01/22/2019   Lab Results  Component Value Date   HGBA1C 6.1 (H) 01/22/2019     Imaging Studies: No results found.  Assessment/plan:  Very pleasant 75 year old male with history of esophageal cancer status post esophagectomy with gastric pull-through in 2010, Barrett's esophagus newly diagnosed in 2019 with no dysplasia, history of adenomatous colon polyps returning to schedule surveillance EGD and colonoscopy.  Patient's past medical history also significant for peripheral vascular disease, CAD, COPD, hypertension.  Clinically he is doing well from a GI standpoint.  Barrett's esophagus: History of remote esophageal cancer in 2018, EGD in 2019 showed new Barrett's.  Due for surveillance at this time.  Patient using Dexilant 60 mg 2-3 times per week as needed for reflux symptoms.  Based on EGD findings, he may need to take Dexilant on a daily basis given history of Barrett's.  GERD: Doing well.  He does have the head of his bed elevated 10 inches to help with nocturnal reflux.  No longer wakes up at night with heartburn or regurgitation.  In the mornings he does have some secretions he has to spit out which he feels is reflux related.  Continue antireflux measures.  History of adenomatous colon polyps: Due for 3-year surveillance colonoscopy at this time.  Constipation: Using MiraLAX 2-3 times per week.  Encourage patient to reassess every 24-48 hours, if no bowel movement go ahead and take a dose.  1. Continue MiraLAX 1 capful daily as needed  to maintain regular bowel movements. 2. Continue Dexilant 60 mg daily as needed for now.  Based on EGD findings he may need to take on a daily basis. 3. Upper endoscopy/colonoscopy with Dr. [name] with propofol. ASA III.  I have discussed the risks, alternatives, benefits  with regards to but not limited to the risk of reaction to medication, bleeding, infection, perforation and the patient is agreeable to proceed. Written consent to be obtained.  We will hold NSAIDs 7 days before procedure.  Continue Plavix and aspirin.

## 2020-11-14 NOTE — Patient Instructions (Signed)
1. Please continue to use MiraLAX 1 capful daily as needed to maintain regular bowel movements. 2. Continue Dexilant daily as needed for acid reflux.  Based on upcoming endoscopy findings, Dr. [name] you may advise that you use on a daily basis because of history of esophageal cancer and Barrett's esophagus. 3. Upper endoscopy and colonoscopy to be scheduled.  Please see separate instructions.

## 2020-11-14 NOTE — Telephone Encounter (Signed)
Will call pt to schedule TCS/EGD w/Propofol w/Dr. [name] ASA 3 when his July schedule is available.

## 2020-11-14 NOTE — Progress Notes (Signed)
Cc'ed to pcp °

## 2020-12-18 MED ORDER — CLENPIQ 10-3.5-12 MG-GM -GM/160ML PO SOLN: 1.0000 | Freq: Once | ORAL | 0 refills | Status: AC

## 2020-12-18 NOTE — Telephone Encounter (Addendum)
Called pt. He has been scheduled for 7/14 at 7:30am. Patient requested low volume prep to be sent in. Advised will do. Also aware will mail instructions with his pre-op appointment. Confirmed mailing address. Confirmed pharmacy   PA approved via [facility] for procedures. Auth# [id number], DOS Jan 29, 2021 - Apr 29, 2021

## 2020-12-18 NOTE — Addendum Note (Signed)
Addended by: Cheron Every on: 12/18/2020 03:12 PM   Modules accepted: Orders

## 2020-12-22 ENCOUNTER — Encounter: Payer: Self-pay | Admitting: *Deleted

## 2020-12-22 ENCOUNTER — Telehealth: Payer: Self-pay | Admitting: Internal Medicine

## 2020-12-22 MED ORDER — PEG 3350-KCL-NA BICARB-NACL 420 G PO SOLR: ORAL | 0 refills | Status: DC

## 2020-12-22 NOTE — Telephone Encounter (Signed)
See previous note he requested low volume prep  Called pt. Aware will send in another prep. Mailed new instructions.

## 2020-12-22 NOTE — Telephone Encounter (Signed)
Pt is scheduled with Dr [name] on 7/14 and his prep is too expensive ($158). Please advise if there's something else he can take. [phone]

## 2020-12-25 ENCOUNTER — Other Ambulatory Visit: Payer: Self-pay

## 2020-12-25 ENCOUNTER — Emergency Department (HOSPITAL_COMMUNITY)
Admission: EM | Admit: 2020-12-25 | Discharge: 2020-12-25 | Disposition: A | Discharge status: Home / Self Care | Payer: Medicare Other | Attending: Emergency Medicine | Admitting: Emergency Medicine

## 2020-12-25 ENCOUNTER — Emergency Department (HOSPITAL_COMMUNITY): Payer: Medicare Other

## 2020-12-25 ENCOUNTER — Encounter (HOSPITAL_COMMUNITY): Payer: Self-pay

## 2020-12-25 DIAGNOSIS — J449 Chronic obstructive pulmonary disease, unspecified: Secondary | ICD-10-CM | POA: Diagnosis not present

## 2020-12-25 DIAGNOSIS — R55 Syncope and collapse: Secondary | ICD-10-CM | POA: Diagnosis not present

## 2020-12-25 DIAGNOSIS — E1169 Type 2 diabetes mellitus with other specified complication: Secondary | ICD-10-CM | POA: Insufficient documentation

## 2020-12-25 DIAGNOSIS — Z87891 Personal history of nicotine dependence: Secondary | ICD-10-CM | POA: Diagnosis not present

## 2020-12-25 DIAGNOSIS — Z20822 Contact with and (suspected) exposure to covid-19: Secondary | ICD-10-CM | POA: Insufficient documentation

## 2020-12-25 DIAGNOSIS — Z79899 Other long term (current) drug therapy: Secondary | ICD-10-CM | POA: Insufficient documentation

## 2020-12-25 DIAGNOSIS — Z951 Presence of aortocoronary bypass graft: Secondary | ICD-10-CM | POA: Diagnosis not present

## 2020-12-25 DIAGNOSIS — Z96642 Presence of left artificial hip joint: Secondary | ICD-10-CM | POA: Diagnosis not present

## 2020-12-25 DIAGNOSIS — I10 Essential (primary) hypertension: Secondary | ICD-10-CM | POA: Diagnosis not present

## 2020-12-25 DIAGNOSIS — Z8501 Personal history of malignant neoplasm of esophagus: Secondary | ICD-10-CM | POA: Diagnosis not present

## 2020-12-25 DIAGNOSIS — E785 Hyperlipidemia, unspecified: Secondary | ICD-10-CM | POA: Insufficient documentation

## 2020-12-25 DIAGNOSIS — Z7982 Long term (current) use of aspirin: Secondary | ICD-10-CM | POA: Insufficient documentation

## 2020-12-25 DIAGNOSIS — R079 Chest pain, unspecified: Secondary | ICD-10-CM

## 2020-12-25 DIAGNOSIS — I959 Hypotension, unspecified: Secondary | ICD-10-CM

## 2020-12-25 DIAGNOSIS — Z7902 Long term (current) use of antithrombotics/antiplatelets: Secondary | ICD-10-CM | POA: Diagnosis not present

## 2020-12-25 DIAGNOSIS — I2510 Atherosclerotic heart disease of native coronary artery without angina pectoris: Secondary | ICD-10-CM | POA: Insufficient documentation

## 2020-12-25 LAB — BASIC METABOLIC PANEL
Anion gap: 6 (ref 5–15)
BUN: 15 mg/dL (ref 8–23)
CO2: 22 mmol/L (ref 22–32)
Calcium: 8.6 mg/dL — ABNORMAL LOW (ref 8.9–10.3)
Chloride: 112 mmol/L — ABNORMAL HIGH (ref 98–111)
Creatinine, Ser: 0.79 mg/dL (ref 0.61–1.24)
GFR, Estimated: >60 mL/min (ref >60)
Glucose, Bld: 99 mg/dL (ref 70–99)
Potassium: 4.3 mmol/L (ref 3.5–5.1)
Sodium: 140 mmol/L (ref 135–145)

## 2020-12-25 LAB — CBC
HCT: 42.7 % (ref 39.0–52.0)
Hemoglobin: 14.3 g/dL (ref 13.0–17.0)
MCH: 31.4 pg (ref 26.0–34.0)
MCHC: 33.5 g/dL (ref 30.0–36.0)
MCV: 93.8 fL (ref 80.0–100.0)
Platelets: 236 10*3/uL (ref 150–400)
RBC: 4.55 MIL/uL (ref 4.22–5.81)
RDW: 13 % (ref 11.5–15.5)
WBC: 17 10*3/uL — ABNORMAL HIGH (ref 4.0–10.5)
nRBC: 0 % (ref 0.0–0.2)

## 2020-12-25 LAB — PROTIME-INR
INR: 1 (ref 0.8–1.2)
Prothrombin Time: 13.4 seconds (ref 11.4–15.2)

## 2020-12-25 LAB — TROPONIN I (HIGH SENSITIVITY)
Troponin I (High Sensitivity): 3 ng/L (ref <18)
Troponin I (High Sensitivity): 3 ng/L (ref <18)

## 2020-12-25 MED ORDER — SODIUM CHLORIDE 0.9 % IV BOLUS
500.0000 mL | Freq: Once | INTRAVENOUS | Status: AC
  Administered 2020-12-25: 500 mL via INTRAVENOUS

## 2020-12-25 NOTE — ED Provider Notes (Addendum)
[facility] EMERGENCY DEPARTMENT Provider Note   CSN: [id number] Arrival date & time: 12/25/20  1116     History No chief complaint on file.   Gregory Eaton is a 75 y.o. male.  HPI  75 year old male with past medical history of HTN, HLD, CAD, DM presents emergency department with a near syncopal episode.  Patient states when he woke up this morning he felt foggy.  He admits to not drinking a lot of fluids.  He was sitting at the table eating breakfast when he started to feel clammy.  He got up to try to walk to his wife which is when she states he looked diaphoretic and lowered himself to the ground.  No full syncope.  EMS reports that he was hypotensive on their arrival.  Patient states that this is happened before, they have attributed to low blood pressure.  He feels sore from his head to his hips but denies any specific chest pain, shortness of breath, acute symptoms.  Past Medical History:  Diagnosis Date   Adenomatous colon polyp 03/2009   Last colonoscopy by Dr. [name]    Adenomatous polyp 2010   Adenomatous polyp of colon 11/03/2010   Arthritis    Barrett's esophagus    CAD (coronary artery disease)    Complication of anesthesia    has a shortened esophogus due to CA.    COPD (chronic obstructive pulmonary disease) (HCC)    Severe emphysema per CT   Diverticulosis    Esophageal carcinoma ([place]) 03/2009   T1N1M0   GERD (gastroesophageal reflux disease)    Glaucoma    History of Doppler ultrasound 11/09/2011   03/2014- 50-69% L ICA stenosis;carotid doppler; L bulb/prox ICA 0-49% diameter reduction; L vertebral artery - occlusive ds; L ECA  demonstrates severe amount of fibrous plaque   History of Doppler ultrasound 11/09/11   LEAs; R ABI - mod art. insuff.; L ABI normal at rest; R SFA - occlusive ds; L SFA - occlusive ds; patent fem-pop graft   History of echocardiogram 08/27/2009   EF >55%   History of kidney stones 1965   History of nuclear stress test 11/24/2011   lexiscan;  normal perfusion; low risk scan; non-diagnostic for ischemia   Hyperlipidemia    Hypertension    Left carotid artery stenosis 04/08/2014   Pulmonary nodule, right 04/08/2014   2.8 mm-incidental finding on CT   PVD (peripheral vascular disease) ([place])    Tachyarrhythmia 1999   Status post ablation at [facility]   Tobacco abuse     Patient Active Problem List   Diagnosis Date Noted   Primary osteoarthritis of left hip 01/30/2019   S/P total hip arthroplasty 01/30/2019   Abdominal pain, chronic, epigastric 09/26/2017   Esophageal dysphagia 09/26/2017   History of adenomatous polyp of colon 09/26/2017   Cerebrovascular disease 05/12/2016   COPD, group B, by GOLD 2017 classification ([place]) 07/18/2015   S/P CABG x 5 01/30/2015   Hyperlipidemia LDL goal <70 10/09/2014   DM type 2 (diabetes mellitus, type 2) ([place]) 04/09/2014   Left carotid artery stenosis 04/08/2014   Pulmonary nodule, right 04/08/2014   Near syncope 04/07/2014   COPD with emphysema ([place]) 04/07/2014   Cancer of abdominal esophagus ([place]) 10/06/2011   Adenomatous polyp of colon 11/03/2010   Esophageal carcinoma ([place]) 03/19/2009   BARRETTS ESOPHAGUS 03/11/2009   Essential hypertension 03/10/2009   RECTAL BLEEDING 03/10/2009   LIPOMA 02/03/2009   HYPERLIPIDEMIA 02/03/2009   Coronary atherosclerosis 02/03/2009  Peripheral vascular disease ([place]) 02/03/2009   GERD 02/03/2009   CAROTID BRUIT, RIGHT 02/03/2009    Past Surgical History:  Procedure Laterality Date   BIOPSY  10/24/2017   Procedure: BIOPSY;  Surgeon: [name], MD;  Location: [facility] ENDO [facility];  Service: Endoscopy;;  esophagus   COLONOSCOPY  03/17/2009   Dr.Rourk- normal rectum, sigmoid diverticula, some pale sigmoid mucosa with diffuse petechiae. pedunculated polyp at the splenic flexure, remainder of colonic mucosa appeared normal. bx= adenomatous polyp   COLONOSCOPY  04/19/2003   Dr.Rehman- few diverticu;a at the sigmoid colon, 3 small polyps, one at the  transverse colon and 2 at the sigmoid, small external hemorrhoids. bx report not available.    COLONOSCOPY WITH PROPOFOL N/A 10/24/2017   Dr. [name]: Diverticulosis, 11 mm polyp at the ileocecal valve, tubular adenoma.  Repeat colonoscopy in 3 years   CORONARY ARTERY BYPASS GRAFT  1998   [name]   ESOPHAGECTOMY  2010   [facility] Dr. [name]   ESOPHAGOGASTRODUODENOSCOPY  11/25/2010   Dr.Rourk- s/p esophagectomy with gastric pull-up, esophageal erosions straddling the surgical anastomosis, salmon colored epithelium coming up a good centimeter to a centimeter and a half above the suture line, islands of salmon colored epithelium in the most poximal residual esophagus, remainder of gastric mucosa appeared normal. bx= swamous &gastric glandular mucosa w/chronic active inflammation   ESOPHAGOGASTRODUODENOSCOPY  03/17/2009   Dr.Rourk- 4cm segment of salmon-colored epithlium distal esophagus suspicious for barretts esophagus. area of suspicious nodularity w/in this segment bx seperately. small to moderate size hiatal hernia, o/w normal stomach D1 and D2 bx=adenocarcinoma   ESOPHAGOGASTRODUODENOSCOPY (EGD) WITH PROPOFOL N/A 10/24/2017   Dr. [name]: Remnant of esophagus with anastomosis with stomach at 24 cm from the incisors, 2 cm above the anastomosis he was found to have Barrett's but no dysplasia.  Advised for repeat EGD in 3 years   FEMORAL-POPLITEAL BYPASS GRAFT  1993   occlusive ds in R SFA   GASTRIC PULL THROUGH  2010   With esophagectomy   LIPOMA EXCISION  2010   POLYPECTOMY  10/24/2017   Procedure: POLYPECTOMY;  Surgeon: [name], MD;  Location: [facility];  Service: Endoscopy;;  colon    TOTAL HIP ARTHROPLASTY Left 01/30/2019   Procedure: TOTAL HIP ARTHROPLASTY ANTERIOR APPROACH;  Surgeon: Renette Butters, MD;  Location: [facility] ORS;  Service: Orthopedics;  Laterality: Left;       Family History  Problem Relation Age of Onset   GER disease Mother    Coronary artery disease Brother     Congenital heart disease Sister    Colon cancer Neg Hx     Social History   Tobacco Use   Smoking status: Former    Packs/day: 2.00    Years: 40.00    Pack years: 80.00    Types: Cigarettes    Start date: 64    Quit date: 07/20/1995    Years since quitting: 25.4   Smokeless tobacco: Never  Vaping Use   Vaping Use: Never used  Substance Use Topics   Alcohol use: No    Alcohol/week: 0.0 standard drinks   Drug use: No    Home Medications Prior to Admission medications   Medication Sig Start Date End Date Taking? Authorizing Provider  acetaminophen (TYLENOL) 500 MG tablet Take 500 mg by mouth daily as needed for headache.   Yes [provider]  ALPRAZolam (XANAX) 0.5 MG tablet Take 1 tablet (0.5 mg total) by mouth at bedtime. 10/07/14  Yes Claiborne Billings,  Joyice Faster, MD  amLODipine (NORVASC) 10 MG tablet Take 1 tablet (10 mg total) by mouth daily. 04/09/20  Yes Troy Sine, MD  aspirin EC 81 MG tablet Take 1 tablet (81 mg total) by mouth 2 (two) times daily. For DVT prophylaxis for 30 days after surgery. Patient taking differently: Take 81 mg by mouth daily. 01/30/19  Yes Prudencio Burly III, PA-C  brimonidine (ALPHAGAN) 0.2 % ophthalmic solution Place 1 drop into the right eye at bedtime.    Yes [provider]  clopidogrel (PLAVIX) 75 MG tablet TAKE 1 TABLET BY MOUTH EVERY DAY Patient taking differently: Take 75 mg by mouth. TAKE 1 TABLET BY MOUTH EVERY DAY 04/09/20  Yes Troy Sine, MD  DEXILANT 60 MG capsule TAKE ONE CAPSULE (60MG  TOTAL) BY MOUTH DAILY BEFORE SUPPER Patient taking differently: Take 60 mg by mouth as needed (takes 2-3 times per week). 08/29/19  Yes Annitta Needs, NP  ezetimibe (ZETIA) 10 MG tablet TAKE ONE (1) TABLET BY MOUTH EVERY DAY Patient taking differently: Take 10 mg by mouth daily. 10/31/20  Yes Troy Sine, MD  Glycopyrrolate-Formoterol (BEVESPI AEROSPHERE) 9-4.8 MCG/ACT AERO Inhale 2 puffs into the lungs 2 (two) times daily.  11/05/20  Yes Margaretha Seeds, MD  latanoprost (XALATAN) 0.005 % ophthalmic solution Place 1 drop into both eyes at bedtime.  01/10/13  Yes [provider]  losartan (COZAAR) 100 MG tablet TAKE ONE TABLET BY MOUTH EVERY NIGHT AT BEDTIME Patient taking differently: Take 100 mg by mouth daily. 02/13/20  Yes [name], PA  Meloxicam (MOBIC PO) Take 15 mg by mouth daily.   Yes [provider]  metoprolol succinate (TOPROL-XL) 50 MG 24 hr tablet TAKE ONE (1) TABLET BY MOUTH EVERY DAY Patient taking differently: Take 50 mg by mouth daily. TAKE ONE (1) TABLET BY MOUTH EVERY DAY 02/04/20  Yes Troy Sine, MD  montelukast (SINGULAIR) 10 MG tablet Take 1 tablet (10 mg total) by mouth daily. 11/05/20  Yes Margaretha Seeds, MD  Multiple Vitamins-Minerals (CENTRUM SILVER PO) Take 1 tablet by mouth daily.   Yes [provider]  polyethylene glycol-electrolytes (NULYTELY) 420 g solution As directed 12/22/20  Yes Rourk, Cristopher Estimable, MD  rosuvastatin (CRESTOR) 10 MG tablet TAKE ONE (1) TABLET BY MOUTH EVERY DAY Patient taking differently: Take 10 mg by mouth daily. TAKE ONE (1) TABLET BY MOUTH EVERY DAY 02/13/20  Yes [name], PA  spironolactone (ALDACTONE) 25 MG tablet Take 0.5 tablets (12.5 mg total) by mouth daily. 04/09/20 12/25/20 Yes Troy Sine, MD  albuterol (PROVENTIL) (2.5 MG/3ML) 0.083% nebulizer solution Take 3 mLs (2.5 mg total) by nebulization every 6 (six) hours as needed for wheezing or shortness of breath. DX: J44.9 11/05/20   Margaretha Seeds, MD  baclofen (LIORESAL) 10 MG tablet Take 1 tablet (10 mg total) by mouth 3 (three) times daily as needed for muscle spasms. 01/30/19   Prudencio Burly III, PA-C  diclofenac sodium (VOLTAREN) 1 % GEL Apply 2 g topically 4 (four) times daily. Rub into affected area of foot 2 to 4 times daily Patient not taking: Reported on 12/25/2020 10/30/18   Trula Slade, DPM  guaiFENesin (MUCINEX) 600 MG 12 hr tablet Take 1,200 mg by  mouth every 12 (twelve) hours as needed (cough).     [provider]  nitroGLYCERIN (NITROSTAT) 0.4 MG SL tablet Place 1 tablet (0.4 mg total) under the tongue every 5 (five) minutes as needed for  chest pain. 01/05/18   [name], PA    Allergies    Altace [ramipril]  Review of Systems   Review of Systems  Constitutional:  Positive for diaphoresis and fatigue. Negative for chills and fever.  HENT:  Negative for congestion.   Eyes:  Negative for visual disturbance.  Respiratory:  Positive for shortness of breath.   Cardiovascular:  Negative for chest pain.  Gastrointestinal:  Negative for abdominal pain, diarrhea and vomiting.  Genitourinary:  Negative for dysuria.  Skin:  Negative for rash.  Neurological:  Positive for light-headedness. Negative for syncope, facial asymmetry and headaches.   Physical Exam Updated Vital Signs BP 117/69   Pulse 89   Temp 98.4 F (36.9 C) (Oral)   Resp 13   Ht 5\' 8"  (1.727 m)   Wt 71.7 kg   SpO2 96%   BMI 24.02 kg/m   Physical Exam Vitals and nursing note reviewed.  Constitutional:      Appearance: Normal appearance. He is not ill-appearing, toxic-appearing or diaphoretic.  HENT:     Head: Normocephalic.     Mouth/Throat:     Mouth: Mucous membranes are moist.  Cardiovascular:     Rate and Rhythm: Normal rate.  Pulmonary:     Effort: Pulmonary effort is normal. No respiratory distress.  Abdominal:     Palpations: Abdomen is soft.     Tenderness: There is no abdominal tenderness.  Musculoskeletal:        General: No swelling.  Skin:    General: Skin is warm.  Neurological:     Mental Status: He is alert and oriented to person, place, and time. Mental status is at baseline.  Psychiatric:        Mood and Affect: Mood normal.    ED Results / Procedures / Treatments   Labs (all labs ordered are listed, but only abnormal results are displayed) Labs Reviewed  BASIC METABOLIC PANEL - Abnormal; Notable for the following  components:      Result Value   Chloride 112 (*)    Calcium 8.6 (*)    All other components within normal limits  CBC - Abnormal; Notable for the following components:   WBC 17.0 (*)    All other components within normal limits  SARS CORONAVIRUS 2 (TAT 6-24 HRS)  PROTIME-INR  TROPONIN I (HIGH SENSITIVITY)  TROPONIN I (HIGH SENSITIVITY)    EKG EKG Interpretation  Date/Time:  Thursday December 25 2020 11:37:21 EDT Ventricular Rate:  83 PR Interval:  221 QRS Duration: 144 QT Interval:  374 QTC Calculation: 440 R Axis:   78 Text Interpretation: Sinus rhythm Prolonged PR interval Right bundle branch block Abnormal inferior Q waves RBBB old, no change from previous Confirmed by Lavenia Atlas [phone]) on 12/25/2020 11:41:11 AM  Radiology DG Chest Port 1 View  Result Date: 12/25/2020 CLINICAL DATA:  Chest pain today.  Syncopal episode today. EXAM: PORTABLE CHEST 1 VIEW COMPARISON:  PA and lateral chest 12/19/2018. FINDINGS: The lungs are emphysematous but clear. The patient is status post CABG. Heart size is normal. Aortic atherosclerosis. No pneumothorax or pleural fluid. No acute or focal bony abnormality. IMPRESSION: No acute disease. Aortic Atherosclerosis (ICD10-I70.0) and Emphysema (ICD10-J43.9). Electronically Signed   By: Inge Rise M.D.   On: 12/25/2020 13:29   DG Shoulder Left  Result Date: 12/25/2020 CLINICAL DATA:  Status post fall with left shoulder pain. EXAM: LEFT SHOULDER - 2+ VIEW COMPARISON:  None. FINDINGS: There is no evidence of fracture or dislocation. Soft  tissues are unremarkable. IMPRESSION: No acute fracture or dislocation. Electronically Signed   By: Abelardo Diesel M.D.   On: 12/25/2020 13:40    Procedures Procedures   Medications Ordered in ED Medications  sodium chloride 0.9 % bolus 500 mL (500 mLs Intravenous New Bag/Given 12/25/20 1518)    ED Course  I have reviewed the triage vital signs and the nursing notes.  Pertinent labs & imaging results that  were available during my care of the patient were reviewed by me and considered in my medical decision making (see chart for details).    MDM Rules/Calculators/A&P                          75 year old male presents emergency department with near syncope.  He was noted to be hypotensive with EMS, got pre arrival fluids.  On arrival his blood pressure does drop when he stands but only 15 points.  EKG is a right bundle branch block, unchanged for the patient.  No active chest pain or shortness of breath.  No tachycardia or hypoxia.  Echo back in September/2021 was normal and reassuring.  Blood work is reassuring, no acute abnormalities.  Most importantly troponin is negative, x2 with no delta.  Chest x-ray is unremarkable.  Low suspicion for PE/dissection in the absence of pain, hypoxia, tachycardia.  After some fluid hydration patient feels well.  Blood pressure is stable.  He has ambulated without any symptoms.  I encouraged oral hydration and outpatient reevaluation with his primary doctor.  He feels well and back to baseline.  With a normal cardiac work-up and normal recent echo I do not see the need for emergent admission, this seems like benign syncope secondary to hypotension, resolved with IV fluids.  Patient will be discharged and treated as an outpatient.  Discharge plan and strict return to ED precautions discussed, patient verbalizes understanding and agreement.  Final Clinical Impression(s) / ED Diagnoses Final diagnoses:  Near syncope  Hypotension, unspecified hypotension type    Rx / DC Orders ED Discharge Orders     None        Lorelle Gibbs, DO 12/25/20 1559    Sallyanne Birkhead, Alvin Critchley, DO 12/26/20 1037

## 2020-12-25 NOTE — ED Triage Notes (Signed)
Patient arrives via EMS, c/o chest pain stated syncopal episode before EMS arrival, EMS stated Hypotensive before their arrival.

## 2020-12-25 NOTE — Discharge Instructions (Signed)
You have been seen and discharged from the emergency department.  Your cardiac work-up, blood work, EKG and chest x-ray were all normal.  I believe her symptoms could have been from low blood pressure/dehydration and decreased p.o. fluid intake.  Stay well-hydrated.  Follow-up with your primary provider for reevaluation and further care. Take home medications as prescribed. If you have any worsening symptoms, chest pain, difficulty breathing or further concerns for your health please return to an emergency department for further evaluation.

## 2020-12-26 LAB — SARS CORONAVIRUS 2 (TAT 6-24 HRS): SARS Coronavirus 2: NEGATIVE

## 2021-01-01 ENCOUNTER — Other Ambulatory Visit: Payer: Self-pay

## 2021-01-01 ENCOUNTER — Observation Stay (HOSPITAL_COMMUNITY): Payer: Medicare Other

## 2021-01-01 ENCOUNTER — Observation Stay (HOSPITAL_COMMUNITY)
Admission: EM | Admit: 2021-01-01 | Discharge: 2021-01-02 | Disposition: A | Discharge status: Home / Self Care | Payer: Medicare Other | Attending: Internal Medicine | Admitting: Internal Medicine

## 2021-01-01 ENCOUNTER — Encounter (HOSPITAL_COMMUNITY): Payer: Self-pay

## 2021-01-01 ENCOUNTER — Encounter: Payer: Self-pay | Admitting: Emergency Medicine

## 2021-01-01 ENCOUNTER — Emergency Department (HOSPITAL_COMMUNITY): Payer: Medicare Other

## 2021-01-01 ENCOUNTER — Ambulatory Visit: Admission: EM | Admit: 2021-01-01 | Discharge: 2021-01-01 | Payer: Medicare Other

## 2021-01-01 DIAGNOSIS — R55 Syncope and collapse: Principal | ICD-10-CM | POA: Diagnosis present

## 2021-01-01 DIAGNOSIS — Z20822 Contact with and (suspected) exposure to covid-19: Secondary | ICD-10-CM | POA: Diagnosis not present

## 2021-01-01 DIAGNOSIS — J439 Emphysema, unspecified: Secondary | ICD-10-CM | POA: Diagnosis present

## 2021-01-01 DIAGNOSIS — R0602 Shortness of breath: Secondary | ICD-10-CM | POA: Diagnosis not present

## 2021-01-01 DIAGNOSIS — J449 Chronic obstructive pulmonary disease, unspecified: Secondary | ICD-10-CM | POA: Insufficient documentation

## 2021-01-01 DIAGNOSIS — I739 Peripheral vascular disease, unspecified: Secondary | ICD-10-CM | POA: Diagnosis present

## 2021-01-01 DIAGNOSIS — R0600 Dyspnea, unspecified: Secondary | ICD-10-CM | POA: Diagnosis not present

## 2021-01-01 DIAGNOSIS — Z79899 Other long term (current) drug therapy: Secondary | ICD-10-CM | POA: Diagnosis not present

## 2021-01-01 DIAGNOSIS — Z8501 Personal history of malignant neoplasm of esophagus: Secondary | ICD-10-CM | POA: Insufficient documentation

## 2021-01-01 DIAGNOSIS — I10 Essential (primary) hypertension: Secondary | ICD-10-CM | POA: Diagnosis not present

## 2021-01-01 DIAGNOSIS — Z87891 Personal history of nicotine dependence: Secondary | ICD-10-CM | POA: Insufficient documentation

## 2021-01-01 DIAGNOSIS — Z951 Presence of aortocoronary bypass graft: Secondary | ICD-10-CM | POA: Diagnosis not present

## 2021-01-01 DIAGNOSIS — E119 Type 2 diabetes mellitus without complications: Secondary | ICD-10-CM | POA: Diagnosis not present

## 2021-01-01 DIAGNOSIS — Z96642 Presence of left artificial hip joint: Secondary | ICD-10-CM | POA: Insufficient documentation

## 2021-01-01 DIAGNOSIS — Z7982 Long term (current) use of aspirin: Secondary | ICD-10-CM | POA: Diagnosis not present

## 2021-01-01 LAB — CBC WITH DIFFERENTIAL/PLATELET
Abs Immature Granulocytes: 0.08 10*3/uL — ABNORMAL HIGH (ref 0.00–0.07)
Basophils Absolute: 0.1 10*3/uL (ref 0.0–0.1)
Basophils Relative: 1 %
Eosinophils Absolute: 0.1 10*3/uL (ref 0.0–0.5)
Eosinophils Relative: 1 %
HCT: 46.6 % (ref 39.0–52.0)
Hemoglobin: 15.2 g/dL (ref 13.0–17.0)
Immature Granulocytes: 1 %
Lymphocytes Relative: 10 %
Lymphs Abs: 1.2 10*3/uL (ref 0.7–4.0)
MCH: 30.8 pg (ref 26.0–34.0)
MCHC: 32.6 g/dL (ref 30.0–36.0)
MCV: 94.3 fL (ref 80.0–100.0)
Monocytes Absolute: 0.9 10*3/uL (ref 0.1–1.0)
Monocytes Relative: 8 %
Neutro Abs: 9.3 10*3/uL — ABNORMAL HIGH (ref 1.7–7.7)
Neutrophils Relative %: 79 %
Platelets: 309 10*3/uL (ref 150–400)
RBC: 4.94 MIL/uL (ref 4.22–5.81)
RDW: 13.1 % (ref 11.5–15.5)
WBC: 11.6 10*3/uL — ABNORMAL HIGH (ref 4.0–10.5)
nRBC: 0 % (ref 0.0–0.2)

## 2021-01-01 LAB — COMPREHENSIVE METABOLIC PANEL
ALT: 23 U/L (ref 0–44)
AST: 22 U/L (ref 15–41)
Albumin: 3.9 g/dL (ref 3.5–5.0)
Alkaline Phosphatase: 64 U/L (ref 38–126)
Anion gap: 6 (ref 5–15)
BUN: 17 mg/dL (ref 8–23)
CO2: 25 mmol/L (ref 22–32)
Calcium: 9.1 mg/dL (ref 8.9–10.3)
Chloride: 109 mmol/L (ref 98–111)
Creatinine, Ser: 1.09 mg/dL (ref 0.61–1.24)
GFR, Estimated: >60 mL/min (ref >60)
Glucose, Bld: 101 mg/dL — ABNORMAL HIGH (ref 70–99)
Potassium: 4.1 mmol/L (ref 3.5–5.1)
Sodium: 140 mmol/L (ref 135–145)
Total Bilirubin: 0.4 mg/dL (ref 0.3–1.2)
Total Protein: 6.8 g/dL (ref 6.5–8.1)

## 2021-01-01 LAB — TROPONIN I (HIGH SENSITIVITY)
Troponin I (High Sensitivity): 3 ng/L (ref <18)
Troponin I (High Sensitivity): 3 ng/L (ref <18)

## 2021-01-01 MED ORDER — BRIMONIDINE TARTRATE 0.2 % OP SOLN
1.0000 [drp] | Freq: Every day | OPHTHALMIC | Status: DC
  Administered 2021-01-02: 1 [drp] via OPHTHALMIC
  Filled 2021-01-01: qty 5

## 2021-01-01 MED ORDER — UMECLIDINIUM BROMIDE 62.5 MCG/INH IN AEPB
1.0000 | INHALATION_SPRAY | Freq: Every day | RESPIRATORY_TRACT | Status: DC
  Filled 2021-01-01: qty 7

## 2021-01-01 MED ORDER — CLOPIDOGREL BISULFATE 75 MG PO TABS
75.0000 mg | ORAL_TABLET | Freq: Every day | ORAL | Status: DC
  Administered 2021-01-01 – 2021-01-02 (×2): 75 mg via ORAL
  Filled 2021-01-01 (×2): qty 1

## 2021-01-01 MED ORDER — SODIUM CHLORIDE 0.9 % IV SOLN: INTRAVENOUS | Status: AC

## 2021-01-01 MED ORDER — IOHEXOL 350 MG/ML SOLN
75.0000 mL | Freq: Once | INTRAVENOUS | Status: AC | PRN
  Administered 2021-01-01: 100 mL via INTRAVENOUS

## 2021-01-01 MED ORDER — ASPIRIN EC 81 MG PO TBEC
81.0000 mg | DELAYED_RELEASE_TABLET | Freq: Every day | ORAL | Status: DC
  Administered 2021-01-01 – 2021-01-02 (×2): 81 mg via ORAL
  Filled 2021-01-01 (×2): qty 1

## 2021-01-01 MED ORDER — ACETAMINOPHEN 325 MG PO TABS: 650.0000 mg | ORAL_TABLET | Freq: Four times a day (QID) | ORAL | Status: DC | PRN

## 2021-01-01 MED ORDER — POLYETHYLENE GLYCOL 3350 17 G PO PACK: 17.0000 g | PACK | Freq: Every day | ORAL | Status: DC | PRN

## 2021-01-01 MED ORDER — ROSUVASTATIN CALCIUM 10 MG PO TABS
10.0000 mg | ORAL_TABLET | Freq: Every day | ORAL | Status: DC
  Administered 2021-01-01 – 2021-01-02 (×2): 10 mg via ORAL
  Filled 2021-01-01 (×2): qty 1

## 2021-01-01 MED ORDER — ENOXAPARIN SODIUM 40 MG/0.4ML IJ SOSY
40.0000 mg | PREFILLED_SYRINGE | INTRAMUSCULAR | Status: DC
  Administered 2021-01-01: 40 mg via SUBCUTANEOUS
  Filled 2021-01-01: qty 0.4

## 2021-01-01 MED ORDER — ACETAMINOPHEN 650 MG RE SUPP: 650.0000 mg | Freq: Four times a day (QID) | RECTAL | Status: DC | PRN

## 2021-01-01 MED ORDER — ARFORMOTEROL TARTRATE 15 MCG/2ML IN NEBU
15.0000 ug | INHALATION_SOLUTION | Freq: Two times a day (BID) | RESPIRATORY_TRACT | Status: DC
  Administered 2021-01-01 – 2021-01-02 (×2): 15 ug via RESPIRATORY_TRACT
  Filled 2021-01-01 (×2): qty 2

## 2021-01-01 MED ORDER — LATANOPROST 0.005 % OP SOLN
1.0000 [drp] | Freq: Every day | OPHTHALMIC | Status: DC
  Administered 2021-01-01: 1 [drp] via OPHTHALMIC
  Filled 2021-01-01: qty 2.5

## 2021-01-01 MED ORDER — SODIUM CHLORIDE 0.9 % IV BOLUS
1000.0000 mL | Freq: Once | INTRAVENOUS | Status: AC
  Administered 2021-01-01: 1000 mL via INTRAVENOUS

## 2021-01-01 MED ORDER — ONDANSETRON HCL 4 MG PO TABS: 4.0000 mg | ORAL_TABLET | Freq: Four times a day (QID) | ORAL | Status: DC | PRN

## 2021-01-01 MED ORDER — IPRATROPIUM-ALBUTEROL 0.5-2.5 (3) MG/3ML IN SOLN: 3.0000 mL | RESPIRATORY_TRACT | Status: DC | PRN

## 2021-01-01 MED ORDER — ALPRAZOLAM 0.5 MG PO TABS
0.5000 mg | ORAL_TABLET | Freq: Every day | ORAL | Status: DC
  Administered 2021-01-01: 0.5 mg via ORAL
  Filled 2021-01-01: qty 1

## 2021-01-01 MED ORDER — ONDANSETRON HCL 4 MG/2ML IJ SOLN: 4.0000 mg | Freq: Four times a day (QID) | INTRAMUSCULAR | Status: DC | PRN

## 2021-01-01 MED ORDER — BRIMONIDINE TARTRATE 0.2 % OP SOLN
1.0000 [drp] | Freq: Every day | OPHTHALMIC | Status: DC
  Filled 2021-01-01 (×2): qty 5

## 2021-01-01 NOTE — ED Notes (Signed)
EMS here to transport patient

## 2021-01-01 NOTE — ED Provider Notes (Signed)
[facility] EMERGENCY DEPARTMENT Provider Note   CSN: [id number] Arrival date & time: 01/01/21  1307     History Chief Complaint  Patient presents with   Hypotension    Gregory Eaton is a 75 y.o. male.  Patient states that he had 2 syncopal episodes today when his stomach was hurting and started sweating he passed out.  This is happened to him before but that was the worst 2 episodes.  He also states that recently he has been getting more short of breath when he walks up and down his driveway.  Patient has a history of coronary disease  The history is provided by the patient and medical records. No language interpreter was used.  Loss of Consciousness Episode history:  Multiple Most recent episode:  Today Timing:  Intermittent Progression:  Unchanged Chronicity:  Recurrent Context: not blood draw   Witnessed: no   Relieved by:  Nothing Worsened by:  Nothing Ineffective treatments:  None tried Associated symptoms: shortness of breath   Associated symptoms: no anxiety, no chest pain, no headaches and no seizures       Past Medical History:  Diagnosis Date   Adenomatous colon polyp 03/2009   Last colonoscopy by Dr. [name]    Adenomatous polyp 2010   Adenomatous polyp of colon 11/03/2010   Arthritis    Barrett's esophagus    CAD (coronary artery disease)    Complication of anesthesia    has a shortened esophogus due to CA.    COPD (chronic obstructive pulmonary disease) (HCC)    Severe emphysema per CT   Diverticulosis    Esophageal carcinoma ([place]) 03/2009   T1N1M0   GERD (gastroesophageal reflux disease)    Glaucoma    History of Doppler ultrasound 11/09/2011   03/2014- 50-69% L ICA stenosis;carotid doppler; L bulb/prox ICA 0-49% diameter reduction; L vertebral artery - occlusive ds; L ECA  demonstrates severe amount of fibrous plaque   History of Doppler ultrasound 11/09/11   LEAs; R ABI - mod art. insuff.; L ABI normal at rest; R SFA - occlusive ds; L SFA - occlusive  ds; patent fem-pop graft   History of echocardiogram 08/27/2009   EF >55%   History of kidney stones 1965   History of nuclear stress test 11/24/2011   lexiscan; normal perfusion; low risk scan; non-diagnostic for ischemia   Hyperlipidemia    Hypertension    Left carotid artery stenosis 04/08/2014   Pulmonary nodule, right 04/08/2014   2.8 mm-incidental finding on CT   PVD (peripheral vascular disease) ([place])    Tachyarrhythmia 1999   Status post ablation at [facility]   Tobacco abuse     Patient Active Problem List   Diagnosis Date Noted   Primary osteoarthritis of left hip 01/30/2019   S/P total hip arthroplasty 01/30/2019   Abdominal pain, chronic, epigastric 09/26/2017   Esophageal dysphagia 09/26/2017   History of adenomatous polyp of colon 09/26/2017   Cerebrovascular disease 05/12/2016   COPD, group B, by GOLD 2017 classification ([place]) 07/18/2015   S/P CABG x 5 01/30/2015   Hyperlipidemia LDL goal <70 10/09/2014   DM type 2 (diabetes mellitus, type 2) ([place]) 04/09/2014   Left carotid artery stenosis 04/08/2014   Pulmonary nodule, right 04/08/2014   Near syncope 04/07/2014   COPD with emphysema ([place]) 04/07/2014   Cancer of abdominal esophagus ([place]) 10/06/2011   Adenomatous polyp of colon 11/03/2010   Esophageal carcinoma ([place]) 03/19/2009   BARRETTS ESOPHAGUS 03/11/2009   Essential hypertension  03/10/2009   RECTAL BLEEDING 03/10/2009   LIPOMA 02/03/2009   HYPERLIPIDEMIA 02/03/2009   Coronary atherosclerosis 02/03/2009   Peripheral vascular disease ([place]) 02/03/2009   GERD 02/03/2009   CAROTID BRUIT, RIGHT 02/03/2009    Past Surgical History:  Procedure Laterality Date   BIOPSY  10/24/2017   Procedure: BIOPSY;  Surgeon: [name], MD;  Location: [facility] ENDO [facility];  Service: Endoscopy;;  esophagus   COLONOSCOPY  03/17/2009   Dr.Rourk- normal rectum, sigmoid diverticula, some pale sigmoid mucosa with diffuse petechiae. pedunculated polyp at the splenic flexure, remainder of  colonic mucosa appeared normal. bx= adenomatous polyp   COLONOSCOPY  04/19/2003   Dr.Rehman- few diverticu;a at the sigmoid colon, 3 small polyps, one at the transverse colon and 2 at the sigmoid, small external hemorrhoids. bx report not available.    COLONOSCOPY WITH PROPOFOL N/A 10/24/2017   Dr. [name]: Diverticulosis, 11 mm polyp at the ileocecal valve, tubular adenoma.  Repeat colonoscopy in 3 years   CORONARY ARTERY BYPASS GRAFT  1998   [name]   ESOPHAGECTOMY  2010   [facility] Dr. [name]   ESOPHAGOGASTRODUODENOSCOPY  11/25/2010   Dr.Rourk- s/p esophagectomy with gastric pull-up, esophageal erosions straddling the surgical anastomosis, salmon colored epithelium coming up a good centimeter to a centimeter and a half above the suture line, islands of salmon colored epithelium in the most poximal residual esophagus, remainder of gastric mucosa appeared normal. bx= swamous &gastric glandular mucosa w/chronic active inflammation   ESOPHAGOGASTRODUODENOSCOPY  03/17/2009   Dr.Rourk- 4cm segment of salmon-colored epithlium distal esophagus suspicious for barretts esophagus. area of suspicious nodularity w/in this segment bx seperately. small to moderate size hiatal hernia, o/w normal stomach D1 and D2 bx=adenocarcinoma   ESOPHAGOGASTRODUODENOSCOPY (EGD) WITH PROPOFOL N/A 10/24/2017   Dr. [name]: Remnant of esophagus with anastomosis with stomach at 24 cm from the incisors, 2 cm above the anastomosis he was found to have Barrett's but no dysplasia.  Advised for repeat EGD in 3 years   FEMORAL-POPLITEAL BYPASS GRAFT  1993   occlusive ds in R SFA   GASTRIC PULL THROUGH  2010   With esophagectomy   LIPOMA EXCISION  2010   POLYPECTOMY  10/24/2017   Procedure: POLYPECTOMY;  Surgeon: [name], MD;  Location: [facility];  Service: Endoscopy;;  colon    TOTAL HIP ARTHROPLASTY Left 01/30/2019   Procedure: TOTAL HIP ARTHROPLASTY ANTERIOR APPROACH;  Surgeon: Renette Butters, MD;  Location: [facility] ORS;   Service: Orthopedics;  Laterality: Left;       Family History  Problem Relation Age of Onset   GER disease Mother    Coronary artery disease Brother    Congenital heart disease Sister    Colon cancer Neg Hx     Social History   Tobacco Use   Smoking status: Former    Packs/day: 2.00    Years: 40.00    Pack years: 80.00    Types: Cigarettes    Start date: 54    Quit date: 07/20/1995    Years since quitting: 25.4   Smokeless tobacco: Never  Vaping Use   Vaping Use: Never used  Substance Use Topics   Alcohol use: No    Alcohol/week: 0.0 standard drinks   Drug use: No    Home Medications Prior to Admission medications   Medication Sig Start Date End Date Taking? Authorizing Provider  acetaminophen (TYLENOL) 500 MG tablet Take 500 mg by mouth daily as needed for headache.   Yes [provider]  albuterol (PROVENTIL) (2.5 MG/3ML) 0.083% nebulizer solution Take 3 mLs (2.5 mg total) by nebulization every 6 (six) hours as needed for wheezing or shortness of breath. DX: J44.9 11/05/20  Yes Margaretha Seeds, MD  ALPRAZolam Duanne Moron) 0.5 MG tablet Take 1 tablet (0.5 mg total) by mouth at bedtime. 10/07/14  Yes Troy Sine, MD  amLODipine (NORVASC) 10 MG tablet Take 1 tablet (10 mg total) by mouth daily. 04/09/20  Yes Troy Sine, MD  aspirin EC 81 MG tablet Take 1 tablet (81 mg total) by mouth 2 (two) times daily. For DVT prophylaxis for 30 days after surgery. Patient taking differently: Take 81 mg by mouth daily. 01/30/19  Yes Martensen, Charna Elizabeth III, PA-C  baclofen (LIORESAL) 10 MG tablet Take 1 tablet (10 mg total) by mouth 3 (three) times daily as needed for muscle spasms. 01/30/19  Yes Prudencio Burly III, PA-C  brimonidine (ALPHAGAN) 0.2 % ophthalmic solution Place 1 drop into the right eye at bedtime.    Yes [provider]  clopidogrel (PLAVIX) 75 MG tablet TAKE 1 TABLET BY MOUTH EVERY DAY Patient taking differently: Take 75 mg by mouth. TAKE 1  TABLET BY MOUTH EVERY DAY 04/09/20  Yes Troy Sine, MD  DEXILANT 60 MG capsule TAKE ONE CAPSULE (60MG  TOTAL) BY MOUTH DAILY BEFORE SUPPER Patient taking differently: Take 60 mg by mouth as needed (takes 2-3 times per week). 08/29/19  Yes Annitta Needs, NP  ezetimibe (ZETIA) 10 MG tablet TAKE ONE (1) TABLET BY MOUTH EVERY DAY Patient taking differently: Take 10 mg by mouth daily. 10/31/20  Yes Troy Sine, MD  Glycopyrrolate-Formoterol (BEVESPI AEROSPHERE) 9-4.8 MCG/ACT AERO Inhale 2 puffs into the lungs 2 (two) times daily. 11/05/20  Yes Margaretha Seeds, MD  guaiFENesin (MUCINEX) 600 MG 12 hr tablet Take 1,200 mg by mouth every 12 (twelve) hours as needed (cough).    Yes [provider]  latanoprost (XALATAN) 0.005 % ophthalmic solution Place 1 drop into both eyes at bedtime.  01/10/13  Yes [provider]  losartan (COZAAR) 100 MG tablet TAKE ONE TABLET BY MOUTH EVERY NIGHT AT BEDTIME Patient taking differently: Take 100 mg by mouth daily. 02/13/20  Yes [name], PA  Meloxicam (MOBIC PO) Take 15 mg by mouth daily.   Yes [provider]  metoprolol succinate (TOPROL-XL) 50 MG 24 hr tablet TAKE ONE (1) TABLET BY MOUTH EVERY DAY Patient taking differently: Take 50 mg by mouth daily. TAKE ONE (1) TABLET BY MOUTH EVERY DAY 02/04/20  Yes Troy Sine, MD  montelukast (SINGULAIR) 10 MG tablet Take 1 tablet (10 mg total) by mouth daily. 11/05/20  Yes Margaretha Seeds, MD  Multiple Vitamins-Minerals (CENTRUM SILVER PO) Take 1 tablet by mouth daily.   Yes [provider]  nitroGLYCERIN (NITROSTAT) 0.4 MG SL tablet Place 1 tablet (0.4 mg total) under the tongue every 5 (five) minutes as needed for chest pain. 01/05/18  Yes Meng, Isaac Laud, PA  rosuvastatin (CRESTOR) 10 MG tablet TAKE ONE (1) TABLET BY MOUTH EVERY DAY Patient taking differently: Take 10 mg by mouth daily. TAKE ONE (1) TABLET BY MOUTH EVERY DAY 02/13/20  Yes [name], PA  spironolactone (ALDACTONE) 25 MG  tablet Take 0.5 tablets (12.5 mg total) by mouth daily. 04/09/20 01/01/21 Yes Troy Sine, MD  diclofenac sodium (VOLTAREN) 1 % GEL Apply 2 g topically 4 (four) times daily. Rub into affected area of foot 2 to 4 times daily  Patient not taking: No sig reported 10/30/18   Trula Slade, DPM  polyethylene glycol-electrolytes (NULYTELY) 420 g solution As directed 12/22/20   Rourk, Cristopher Estimable, MD    Allergies    Altace [ramipril]  Review of Systems   Review of Systems  Constitutional:  Negative for appetite change and fatigue.  HENT:  Negative for congestion, ear discharge and sinus pressure.   Eyes:  Negative for discharge.  Respiratory:  Positive for shortness of breath. Negative for cough.   Cardiovascular:  Positive for syncope. Negative for chest pain.  Gastrointestinal:  Negative for abdominal pain and diarrhea.  Genitourinary:  Negative for frequency and hematuria.  Musculoskeletal:  Negative for back pain.  Skin:  Negative for rash.  Neurological:  Negative for seizures and headaches.  Psychiatric/Behavioral:  Negative for hallucinations.    Physical Exam Updated Vital Signs BP (!) 134/51   Pulse 79   Temp 97.8 F (36.6 C) (Oral)   Resp 10   Ht 5\' 8"  (1.727 m)   Wt 71.7 kg   SpO2 97%   BMI 24.02 kg/m   Physical Exam Vitals and nursing note reviewed.  Constitutional:      Appearance: He is well-developed.  HENT:     Head: Normocephalic.     Nose: Nose normal.  Eyes:     General: No scleral icterus.    Conjunctiva/sclera: Conjunctivae normal.  Neck:     Thyroid: No thyromegaly.  Cardiovascular:     Rate and Rhythm: Normal rate and regular rhythm.     Heart sounds: No murmur heard.   No friction rub. No gallop.  Pulmonary:     Breath sounds: No stridor. No wheezing or rales.  Chest:     Chest wall: No tenderness.  Abdominal:     General: There is no distension.     Tenderness: There is no abdominal tenderness. There is no rebound.  Musculoskeletal:         General: Normal range of motion.     Cervical back: Neck supple.  Lymphadenopathy:     Cervical: No cervical adenopathy.  Skin:    Findings: No erythema or rash.  Neurological:     Mental Status: He is alert and oriented to person, place, and time.     Motor: No abnormal muscle tone.     Coordination: Coordination normal.  Psychiatric:        Behavior: Behavior normal.    ED Results / Procedures / Treatments   Labs (all labs ordered are listed, but only abnormal results are displayed) Labs Reviewed  CBC WITH DIFFERENTIAL/PLATELET - Abnormal; Notable for the following components:      Result Value   WBC 11.6 (*)    Neutro Abs 9.3 (*)    Abs Immature Granulocytes 0.08 (*)    All other components within normal limits  COMPREHENSIVE METABOLIC PANEL - Abnormal; Notable for the following components:   Glucose, Bld 101 (*)    All other components within normal limits  I-STAT CHEM 8, ED  TROPONIN I (HIGH SENSITIVITY)  TROPONIN I (HIGH SENSITIVITY)    EKG EKG Interpretation  Date/Time:  Thursday January 01 2021 13:57:35 EDT Ventricular Rate:  65 PR Interval:  229 QRS Duration: 146 QT Interval:  422 QTC Calculation: 439 R Axis:   82 Text Interpretation: Sinus rhythm Prolonged PR interval Right bundle branch block Confirmed by Milton Ferguson [phone]) on 01/01/2021 2:04:41 PM  Radiology DG Chest Port 1 View  Result Date: 01/01/2021 CLINICAL DATA:  Weakness.  Hypotension. EXAM: PORTABLE CHEST 1 VIEW COMPARISON:  12/25/2020 FINDINGS: Previous median sternotomy and CABG. Heart size upper limits of normal. Mediastinal density related to previous gastric pull-through procedure. Chronic emphysema and pulmonary scarring. No sign of active infiltrate, mass, effusion or acute collapse. IMPRESSION: No acute finding. Previous CABG. Background emphysema with pulmonary scarring. Previous gastric pull-through procedure. Electronically Signed   By: Nelson Chimes M.D.   On: 01/01/2021 13:54     Procedures Procedures   Medications Ordered in ED Medications  sodium chloride 0.9 % bolus 1,000 mL (0 mLs Intravenous Stopped 01/01/21 1501)    ED Course  I have reviewed the triage vital signs and the nursing notes.  Pertinent labs & imaging results that were available during my care of the patient were reviewed by me and considered in my medical decision making (see chart for details).    MDM Rules/Calculators/A&P                         Patient with syncope and dyspnea on exertion.  He will be admitted to medicine with possible cardiac consult Final Clinical Impression(s) / ED Diagnoses Final diagnoses:  Syncope and collapse    Rx / DC Orders ED Discharge Orders     None        Milton Ferguson, MD 01/05/21 1707

## 2021-01-01 NOTE — ED Notes (Signed)
Patient transported to CT 

## 2021-01-01 NOTE — ED Triage Notes (Signed)
Pt brought to ED via [facility] for hypotension. Pt had 2 episodes of LOC today. Pt denies pain. Pt BP systolic initially 62 then 85. Pt given 300 ml NS PTA.Pt denies pain prior to LOC episode.

## 2021-01-01 NOTE — H&P (Signed)
History and Physical    Gregory Eaton FMB:[id number] DOB: 1945/08/28 DOA: 01/01/2021  PCP: Curly Rim, MD   Patient coming from: Home  I have personally briefly reviewed patient's old medical records in [place]  Chief Complaint: Passing out  HPI: Gregory Eaton is a 75 y.o. male with medical history significant for CABG X 5, COPD, peripheral vascular disease, hypertension. Patient presented to the ED with complaints of multiple episodes of passing out, ongoing over the past year.  Before he passes out, he feels clammy and sweaty, and no symptoms about to happen.  He denies palpitations, no chest pains.  He reports difficulty breathing when he is walking down his long driveway.   He passed out twice today.  The first time he was in the restaurant today with his wife, he was sitting down, he had just eating per spouse patient's, when patient appeared clammy and his head just dropped down and he was out for a second before he was up again.  He went to urgent care, and was referred to the ED, when he was sitting attempting to get into a vehicle when he passed out again.  Spouse reports that patient's is barely out for like a second, when he comes out of it, he is initially groggy.  Patient has had multiple episodes while doing several things.  But episodes have never occurred while he is lying down.  He had 2 episodes of vomiting today.  No loose stools.  Appetite has been good.  He reports chronic pain in his lower extremities with activity, no swelling or redness.  On EMS arrival, patient blood pressure systolic was initially 62, then 85.  300 mill bolus given prior to arrival.  Patient tells me he was told by EMS his heart rate was in the 40s  ED Course: Blood pressure systolic on arrival 70/17, heart rate 67, subsequently heart rate was recorded at 57. S/p 1 L bolus blood pressure improved to 130s.  WBC 11.6.  Troponin 3.  Chest x-ray unremarkable.  EKG shows sinus rhythm, RBBB.   Hospitalist to admit for syncope.  Review of Systems: As per HPI all other systems reviewed and negative.  Past Medical History:  Diagnosis Date   Adenomatous colon polyp 03/2009   Last colonoscopy by Dr. [name]    Adenomatous polyp 2010   Adenomatous polyp of colon 11/03/2010   Arthritis    Barrett's esophagus    CAD (coronary artery disease)    Complication of anesthesia    has a shortened esophogus due to CA.    COPD (chronic obstructive pulmonary disease) (HCC)    Severe emphysema per CT   Diverticulosis    Esophageal carcinoma ([place]) 03/2009   T1N1M0   GERD (gastroesophageal reflux disease)    Glaucoma    History of Doppler ultrasound 11/09/2011   03/2014- 50-69% L ICA stenosis;carotid doppler; L bulb/prox ICA 0-49% diameter reduction; L vertebral artery - occlusive ds; L ECA  demonstrates severe amount of fibrous plaque   History of Doppler ultrasound 11/09/11   LEAs; R ABI - mod art. insuff.; L ABI normal at rest; R SFA - occlusive ds; L SFA - occlusive ds; patent fem-pop graft   History of echocardiogram 08/27/2009   EF >55%   History of kidney stones 1965   History of nuclear stress test 11/24/2011   lexiscan; normal perfusion; low risk scan; non-diagnostic for ischemia   Hyperlipidemia    Hypertension    Left carotid  artery stenosis 04/08/2014   Pulmonary nodule, right 04/08/2014   2.8 mm-incidental finding on CT   PVD (peripheral vascular disease) ([place])    Tachyarrhythmia 1999   Status post ablation at [facility]   Tobacco abuse     Past Surgical History:  Procedure Laterality Date   BIOPSY  10/24/2017   Procedure: BIOPSY;  Surgeon: [name], MD;  Location: [facility];  Service: Endoscopy;;  esophagus   COLONOSCOPY  03/17/2009   Dr.Rourk- normal rectum, sigmoid diverticula, some pale sigmoid mucosa with diffuse petechiae. pedunculated polyp at the splenic flexure, remainder of colonic mucosa appeared normal. bx= adenomatous polyp   COLONOSCOPY  04/19/2003   Dr.Rehman-  few diverticu;a at the sigmoid colon, 3 small polyps, one at the transverse colon and 2 at the sigmoid, small external hemorrhoids. bx report not available.    COLONOSCOPY WITH PROPOFOL N/A 10/24/2017   Dr. [name]: Diverticulosis, 11 mm polyp at the ileocecal valve, tubular adenoma.  Repeat colonoscopy in 3 years   CORONARY ARTERY BYPASS GRAFT  1998   [name]   ESOPHAGECTOMY  2010   [facility] Dr. [name]   ESOPHAGOGASTRODUODENOSCOPY  11/25/2010   Dr.Rourk- s/p esophagectomy with gastric pull-up, esophageal erosions straddling the surgical anastomosis, salmon colored epithelium coming up a good centimeter to a centimeter and a half above the suture line, islands of salmon colored epithelium in the most poximal residual esophagus, remainder of gastric mucosa appeared normal. bx= swamous &gastric glandular mucosa w/chronic active inflammation   ESOPHAGOGASTRODUODENOSCOPY  03/17/2009   Dr.Rourk- 4cm segment of salmon-colored epithlium distal esophagus suspicious for barretts esophagus. area of suspicious nodularity w/in this segment bx seperately. small to moderate size hiatal hernia, o/w normal stomach D1 and D2 bx=adenocarcinoma   ESOPHAGOGASTRODUODENOSCOPY (EGD) WITH PROPOFOL N/A 10/24/2017   Dr. [name]: Remnant of esophagus with anastomosis with stomach at 24 cm from the incisors, 2 cm above the anastomosis he was found to have Barrett's but no dysplasia.  Advised for repeat EGD in 3 years   FEMORAL-POPLITEAL BYPASS GRAFT  1993   occlusive ds in R SFA   GASTRIC PULL THROUGH  2010   With esophagectomy   LIPOMA EXCISION  2010   POLYPECTOMY  10/24/2017   Procedure: POLYPECTOMY;  Surgeon: [name], MD;  Location: [facility];  Service: Endoscopy;;  colon    TOTAL HIP ARTHROPLASTY Left 01/30/2019   Procedure: TOTAL HIP ARTHROPLASTY ANTERIOR APPROACH;  Surgeon: Renette Butters, MD;  Location: [facility];  Service: Orthopedics;  Laterality: Left;     reports that he quit smoking about 25 years  ago. His smoking use included cigarettes. He started smoking about 65 years ago. He has a 80.00 pack-year smoking history. He has never used smokeless tobacco. He reports that he does not drink alcohol and does not use drugs.  Allergies  Allergen Reactions   Altace [Ramipril] Cough    Family History  Problem Relation Age of Onset   GER disease Mother    Coronary artery disease Brother    Congenital heart disease Sister    Colon cancer Neg Hx     Acceptable: Family history reviewed and not pertinent (If you reviewed it)  Prior to Admission medications   Medication Sig Start Date End Date Taking? Authorizing Provider  acetaminophen (TYLENOL) 500 MG tablet Take 500 mg by mouth daily as needed for headache.   Yes [provider]  albuterol (PROVENTIL) (2.5 MG/3ML) 0.083% nebulizer solution Take 3 mLs (2.5 mg total) by  nebulization every 6 (six) hours as needed for wheezing or shortness of breath. DX: J44.9 11/05/20  Yes Margaretha Seeds, MD  ALPRAZolam Duanne Moron) 0.5 MG tablet Take 1 tablet (0.5 mg total) by mouth at bedtime. 10/07/14  Yes Troy Sine, MD  amLODipine (NORVASC) 10 MG tablet Take 1 tablet (10 mg total) by mouth daily. 04/09/20  Yes Troy Sine, MD  aspirin EC 81 MG tablet Take 1 tablet (81 mg total) by mouth 2 (two) times daily. For DVT prophylaxis for 30 days after surgery. Patient taking differently: Take 81 mg by mouth daily. 01/30/19  Yes Martensen, Charna Elizabeth III, PA-C  baclofen (LIORESAL) 10 MG tablet Take 1 tablet (10 mg total) by mouth 3 (three) times daily as needed for muscle spasms. 01/30/19  Yes Prudencio Burly III, PA-C  brimonidine (ALPHAGAN) 0.2 % ophthalmic solution Place 1 drop into the right eye at bedtime.    Yes [provider]  clopidogrel (PLAVIX) 75 MG tablet TAKE 1 TABLET BY MOUTH EVERY DAY Patient taking differently: Take 75 mg by mouth. TAKE 1 TABLET BY MOUTH EVERY DAY 04/09/20  Yes Troy Sine, MD  DEXILANT 60 MG  capsule TAKE ONE CAPSULE (60MG  TOTAL) BY MOUTH DAILY BEFORE SUPPER Patient taking differently: Take 60 mg by mouth as needed (takes 2-3 times per week). 08/29/19  Yes Annitta Needs, NP  ezetimibe (ZETIA) 10 MG tablet TAKE ONE (1) TABLET BY MOUTH EVERY DAY Patient taking differently: Take 10 mg by mouth daily. 10/31/20  Yes Troy Sine, MD  Glycopyrrolate-Formoterol (BEVESPI AEROSPHERE) 9-4.8 MCG/ACT AERO Inhale 2 puffs into the lungs 2 (two) times daily. 11/05/20  Yes Margaretha Seeds, MD  guaiFENesin (MUCINEX) 600 MG 12 hr tablet Take 1,200 mg by mouth every 12 (twelve) hours as needed (cough).    Yes [provider]  latanoprost (XALATAN) 0.005 % ophthalmic solution Place 1 drop into both eyes at bedtime.  01/10/13  Yes [provider]  losartan (COZAAR) 100 MG tablet TAKE ONE TABLET BY MOUTH EVERY NIGHT AT BEDTIME Patient taking differently: Take 100 mg by mouth daily. 02/13/20  Yes [name], PA  Meloxicam (MOBIC PO) Take 15 mg by mouth daily.   Yes [provider]  metoprolol succinate (TOPROL-XL) 50 MG 24 hr tablet TAKE ONE (1) TABLET BY MOUTH EVERY DAY Patient taking differently: Take 50 mg by mouth daily. TAKE ONE (1) TABLET BY MOUTH EVERY DAY 02/04/20  Yes Troy Sine, MD  montelukast (SINGULAIR) 10 MG tablet Take 1 tablet (10 mg total) by mouth daily. 11/05/20  Yes Margaretha Seeds, MD  Multiple Vitamins-Minerals (CENTRUM SILVER PO) Take 1 tablet by mouth daily.   Yes [provider]  nitroGLYCERIN (NITROSTAT) 0.4 MG SL tablet Place 1 tablet (0.4 mg total) under the tongue every 5 (five) minutes as needed for chest pain. 01/05/18  Yes Meng, Isaac Laud, PA  rosuvastatin (CRESTOR) 10 MG tablet TAKE ONE (1) TABLET BY MOUTH EVERY DAY Patient taking differently: Take 10 mg by mouth daily. TAKE ONE (1) TABLET BY MOUTH EVERY DAY 02/13/20  Yes [name], PA  spironolactone (ALDACTONE) 25 MG tablet Take 0.5 tablets (12.5 mg total) by mouth daily. 04/09/20 01/01/21 Yes  Troy Sine, MD  diclofenac sodium (VOLTAREN) 1 % GEL Apply 2 g topically 4 (four) times daily. Rub into affected area of foot 2 to 4 times daily Patient not taking: No sig reported 10/30/18   Trula Slade, DPM  polyethylene glycol-electrolytes (  NULYTELY) 420 g solution As directed 12/22/20   [name], MD    Physical Exam: Vitals:   01/01/21 1354 01/01/21 1400 01/01/21 1520 01/01/21 1530  BP: (!) 134/55 (!) 136/58 139/90 (!) 134/51  Pulse: 69 68 82 79  Resp:  11 11 10   Temp:      TempSrc:      SpO2: 100% 99% 99% 97%  Weight:      Height:        Constitutional: NAD, calm, comfortable Vitals:   01/01/21 1354 01/01/21 1400 01/01/21 1520 01/01/21 1530  BP: (!) 134/55 (!) 136/58 139/90 (!) 134/51  Pulse: 69 68 82 79  Resp:  11 11 10   Temp:      TempSrc:      SpO2: 100% 99% 99% 97%  Weight:      Height:       Eyes: PERRL, lids and conjunctivae normal ENMT: Mucous membranes are moist.  Neck: normal, supple, no masses, no thyromegaly Respiratory: clear to auscultation bilaterally, no wheezing, no crackles. Normal respiratory effort. No accessory muscle use.  Cardiovascular: Regular rate and rhythm, 2/6 faint systolic murmurs / rubs / gallops. No extremity edema. 2+ pedal pulses.   Abdomen: no tenderness, no masses palpated. No hepatosplenomegaly. Bowel sounds positive.  Musculoskeletal: no clubbing / cyanosis. No joint deformity upper and lower extremities. Good ROM, no contractures. Normal muscle tone.  Skin: no rashes, lesions, ulcers. No induration Neurologic: No apparent cranial nerve abnormality, moving extremities spontaneously Psychiatric: Normal judgment and insight. Alert and oriented x 3. Normal mood.   Labs on Admission: I have personally reviewed following labs and imaging studies  CBC: Recent Labs  Lab 01/01/21 1405  WBC 11.6*  NEUTROABS 9.3*  HGB 15.2  HCT 46.6  MCV 94.3  PLT 989   Basic Metabolic Panel: Recent Labs  Lab 01/01/21 1405  NA  140  K 4.1  CL 109  CO2 25  GLUCOSE 101*  BUN 17  CREATININE 1.09  CALCIUM 9.1   GFR: Estimated Creatinine Clearance: 55.8 mL/min (by C-G formula based on SCr of 1.09 mg/dL). Liver Function Tests: Recent Labs  Lab 01/01/21 1405  AST 22  ALT 23  ALKPHOS 64  BILITOT 0.4  PROT 6.8  ALBUMIN 3.9    Radiological Exams on Admission: DG Chest Port 1 View  Result Date: 01/01/2021 CLINICAL DATA:  Weakness.  Hypotension. EXAM: PORTABLE CHEST 1 VIEW COMPARISON:  12/25/2020 FINDINGS: Previous median sternotomy and CABG. Heart size upper limits of normal. Mediastinal density related to previous gastric pull-through procedure. Chronic emphysema and pulmonary scarring. No sign of active infiltrate, mass, effusion or acute collapse. IMPRESSION: No acute finding. Previous CABG. Background emphysema with pulmonary scarring. Previous gastric pull-through procedure. Electronically Signed   By: Nelson Chimes M.D.   On: 01/01/2021 13:54    EKG: Independently reviewed.  Sinus rhythm, rate 65, QTc 439.  PR interval prolonged at 229, RBBB ( Neither of these are new).  Assessment/Plan Principal Problem:   Syncope Active Problems:   Essential hypertension   Peripheral vascular disease (HCC)   COPD with emphysema (HCC)   Syncope -multiple episodes.  With prodrome.  Hypotensive today systolic down to 21J per EMS. Considering duration and frequency, differentials include arrhythmia, vasovagal syncope. EKG  showing first-degree AV block, not new.  Troponin 3 X 2.  Chest x-ray unremarkable. -Obtain echocardiogram -Obtain CTA chest with unexplained hypotension -Cardiology consult, considering CABG history , and patient may benefit from outpatient prolonged cardiac monitoring. -Hold  antihypertensives for now spironolactone, Norvasc, losartan and metoprolol. -1 L bolus given, continue N/s 100cc/hr x 10hrs  Dyspnea on exertion-O2 sats 98% on room air.  Chest x-ray unremarkable.  Troponins EKG unremarkable. -  Obtain CTA chest - May warrant cardiac evaluation to rule out coronary artery disease as etiology -N.p.o. midnight  CABG x 5 - 1998, at [facility].  EKG shows first- old degree AV block he is on metoprolol. Last ECHo 03/2020- Ef 60-65%.  -Compliant with aspirin and Plavix,  - Hold lorsatan, spironolactone and metoprolol  COPD-stable. - DuoNebs as needed   Hypertension-stable. - Resume metoprolol, spironolactone, losartan   DVT prophylaxis: Lovenox Code Status: Full code Family Communication: Spouse at bedside Disposition Plan: ~ 1 - 2 days Consults called: CArdiology Admission status: Obs tele    Bethena Roys MD [facility]  01/01/2021, 5:37 PM

## 2021-01-01 NOTE — ED Notes (Signed)
Patient has dinner tray at bedside. 

## 2021-01-02 ENCOUNTER — Encounter (HOSPITAL_COMMUNITY): Payer: Self-pay | Admitting: Internal Medicine

## 2021-01-02 ENCOUNTER — Observation Stay (HOSPITAL_BASED_OUTPATIENT_CLINIC_OR_DEPARTMENT_OTHER): Payer: Medicare Other

## 2021-01-02 DIAGNOSIS — I10 Essential (primary) hypertension: Secondary | ICD-10-CM | POA: Diagnosis not present

## 2021-01-02 DIAGNOSIS — J439 Emphysema, unspecified: Secondary | ICD-10-CM

## 2021-01-02 DIAGNOSIS — R55 Syncope and collapse: Secondary | ICD-10-CM | POA: Diagnosis not present

## 2021-01-02 DIAGNOSIS — E785 Hyperlipidemia, unspecified: Secondary | ICD-10-CM

## 2021-01-02 LAB — BASIC METABOLIC PANEL
Anion gap: 7 (ref 5–15)
BUN: 12 mg/dL (ref 8–23)
CO2: 23 mmol/L (ref 22–32)
Calcium: 8.5 mg/dL — ABNORMAL LOW (ref 8.9–10.3)
Chloride: 108 mmol/L (ref 98–111)
Creatinine, Ser: 0.77 mg/dL (ref 0.61–1.24)
GFR, Estimated: >60 mL/min (ref >60)
Glucose, Bld: 110 mg/dL — ABNORMAL HIGH (ref 70–99)
Potassium: 4 mmol/L (ref 3.5–5.1)
Sodium: 138 mmol/L (ref 135–145)

## 2021-01-02 LAB — ECHOCARDIOGRAM COMPLETE
AR max vel: 2.59 cm2
AV Area VTI: 2.47 cm2
AV Area mean vel: 2.32 cm2
AV Mean grad: 3.2 mmHg
AV Peak grad: 6.2 mmHg
Ao pk vel: 1.24 m/s
Area-P 1/2: 9.14 cm2
Height: 68 in
S' Lateral: 2.05 cm
Weight: 2547.2 oz

## 2021-01-02 LAB — CBC
HCT: 41.6 % (ref 39.0–52.0)
Hemoglobin: 13.7 g/dL (ref 13.0–17.0)
MCH: 30.7 pg (ref 26.0–34.0)
MCHC: 32.9 g/dL (ref 30.0–36.0)
MCV: 93.3 fL (ref 80.0–100.0)
Platelets: 274 10*3/uL (ref 150–400)
RBC: 4.46 MIL/uL (ref 4.22–5.81)
RDW: 13 % (ref 11.5–15.5)
WBC: 10.4 10*3/uL (ref 4.0–10.5)
nRBC: 0 % (ref 0.0–0.2)

## 2021-01-02 LAB — SARS CORONAVIRUS 2 (TAT 6-24 HRS): SARS Coronavirus 2: NEGATIVE

## 2021-01-02 MED ORDER — ASPIRIN EC 81 MG PO TBEC: 81.0000 mg | DELAYED_RELEASE_TABLET | Freq: Every day | ORAL | Status: DC

## 2021-01-02 MED ORDER — ROSUVASTATIN CALCIUM 10 MG PO TABS: 10.0000 mg | ORAL_TABLET | Freq: Every day | ORAL | Status: DC

## 2021-01-02 MED ORDER — METOPROLOL SUCCINATE ER 50 MG PO TB24: 50.0000 mg | ORAL_TABLET | Freq: Every day | ORAL | Status: DC

## 2021-01-02 MED ORDER — DEXLANSOPRAZOLE 60 MG PO CPDR: 60.0000 mg | DELAYED_RELEASE_CAPSULE | ORAL | Status: DC | PRN

## 2021-01-02 MED ORDER — LOSARTAN POTASSIUM 100 MG PO TABS: 100.0000 mg | ORAL_TABLET | Freq: Every day | ORAL | Status: DC

## 2021-01-02 MED ORDER — CLOPIDOGREL BISULFATE 75 MG PO TABS: 75.0000 mg | ORAL_TABLET | Freq: Every day | ORAL | Status: DC

## 2021-01-02 MED ORDER — EZETIMIBE 10 MG PO TABS: 10.0000 mg | ORAL_TABLET | Freq: Every day | ORAL | Status: DC

## 2021-01-02 NOTE — Progress Notes (Signed)
Orthostatic vitals obtained and charted.

## 2021-01-02 NOTE — Progress Notes (Signed)
  Echocardiogram 2D Echocardiogram has been performed.  Gregory Eaton 01/02/2021, 9:38 AM

## 2021-01-02 NOTE — Consult Note (Addendum)
Cardiology Consultation:   Patient ID: [name] MRN: [id number]; DOB: Aug 30, 1945  Admit date: 01/01/2021 Date of Consult: 01/02/2021  PCP:  Gregory Rim, MD   [facility] Providers Cardiologist:  Shelva Majestic, MD    Patient Profile:   Gregory Eaton is a 75 y.o. male with past medical history of CAD (s/p CABG in 1998, low-risk NST in 07/2013), PAD (s/p fem-pop bypass in 1993 and known occluded right SFA), prior ablation at [facility] in the 1990's, HTN, HLD, carotid artery stenosis, COPD and history of esophageal cancer who is being seen today for the evaluation of syncope at the request of Dr. Arlyce Dice.  History of Present Illness:   Gregory Eaton was last examined by Dr. Claiborne Billings in 06/2020 and he denied any chest pain or dyspnea on exertion at that time. Was still having some intermittent mild claudication symptoms. He was continued on his current medication regimen including DAPT with ASA and Plavix.   He was recently evaluated in the ED on 12/25/2020 for a pre-syncopal episode but did not lose consciousness. By review of notes he was mildly orthostatic at that time with BP dropping by 15 points with standing. Increased hydration was recommended as his dizziness and hypotension resolved with IVF.   He presented back to the ED on 01/01/2021 for evaluation of 2 syncopal episodes and SBP was in the 60's initially upon EMS arrival and HR was in the 40's. Initial labs showed WBC 11.6, Hgb 15.2, platelets 309, Na+ 140, K+ 4.1 and creatinine 1.09 (baseline 0.7 - 0.8). COVID negative. Initial and repeat Hs Troponin negative at 3. CXR showed no acute findings. CTA showed no evidence of a PE but was noted to have emphysema and a 9 mm nodule along the superior segment of the right lower lung and similar to prior imaging suggesting a benign etiology. EKG showed NSR with 1st degree AV Block, HR 65 and known RBBB.   His PTA Amlodipine 10mg  daily, Losartan 100mg  daily, Toprol-XL 50mg  daily and  Spironolactone 12.5mg  daily were held on admission. Received a 1L fluid bolus along with 100 mL/hr at the time admission until this AM.   In talking with the patient today, he reports he had discontinued Spironolactone per his PCP for 3 weeks approximately a month ago to see if this would help this leg pain and it did not. Since resuming it, he has experienced presyncope and 2 syncopal episodes. Says he was in his usual state of health until yesterday when he felt some abdominal discomfort while eating a local restaurant. Went to Urgent Care and had 2 syncopal, episodes which prompted him to come to the ED. Says he believes he only lost consciousness for a few seconds. Was sitting when the episodes occurred. Says he felt clammy at the time but denies any associated chest pain, palpitations or dizziness. No headaches or bowel/bladder incontinence. No reports of seizure-like activity. Does report known claudication symptoms which have been unchanged. Also experiences intermittent dyspnea on exertion in the setting of known emphysema but denies any worsening of symptoms or associated chest pain.   Says he "felt great" after receiving IVF yesterday and has felt well overnight and this morning.    Past Medical History:  Diagnosis Date   Adenomatous colon polyp 03/2009   Last colonoscopy by Dr. [name]    Adenomatous polyp 2010   Adenomatous polyp of colon 11/03/2010   Arthritis    Barrett's esophagus    CAD (coronary artery disease)  Complication of anesthesia    has a shortened esophogus due to CA.    COPD (chronic obstructive pulmonary disease) (HCC)    Severe emphysema per CT   Diverticulosis    Esophageal carcinoma ([place]) 03/2009   T1N1M0   GERD (gastroesophageal reflux disease)    Glaucoma    History of Doppler ultrasound 11/09/2011   03/2014- 50-69% L ICA stenosis;carotid doppler; L bulb/prox ICA 0-49% diameter reduction; L vertebral artery - occlusive ds; L ECA  demonstrates severe amount of  fibrous plaque   History of Doppler ultrasound 11/09/11   LEAs; R ABI - mod art. insuff.; L ABI normal at rest; R SFA - occlusive ds; L SFA - occlusive ds; patent fem-pop graft   History of echocardiogram 08/27/2009   EF >55%   History of kidney stones 1965   History of nuclear stress test 11/24/2011   lexiscan; normal perfusion; low risk scan; non-diagnostic for ischemia   Hyperlipidemia    Hypertension    Left carotid artery stenosis 04/08/2014   Pulmonary nodule, right 04/08/2014   2.8 mm-incidental finding on CT   PVD (peripheral vascular disease) ([place])    Tachyarrhythmia 1999   Status post ablation at [facility]   Tobacco abuse     Past Surgical History:  Procedure Laterality Date   BIOPSY  10/24/2017   Procedure: BIOPSY;  Surgeon: [name], MD;  Location: [facility];  Service: Endoscopy;;  esophagus   COLONOSCOPY  03/17/2009   Dr.Rourk- normal rectum, sigmoid diverticula, some pale sigmoid mucosa with diffuse petechiae. pedunculated polyp at the splenic flexure, remainder of colonic mucosa appeared normal. bx= adenomatous polyp   COLONOSCOPY  04/19/2003   Dr.Rehman- few diverticu;a at the sigmoid colon, 3 small polyps, one at the transverse colon and 2 at the sigmoid, small external hemorrhoids. bx report not available.    COLONOSCOPY WITH PROPOFOL N/A 10/24/2017   Dr. [name]: Diverticulosis, 11 mm polyp at the ileocecal valve, tubular adenoma.  Repeat colonoscopy in 3 years   CORONARY ARTERY BYPASS GRAFT  1998   [name]   ESOPHAGECTOMY  2010   [facility] Dr. [name]   ESOPHAGOGASTRODUODENOSCOPY  11/25/2010   Dr.Rourk- s/p esophagectomy with gastric pull-up, esophageal erosions straddling the surgical anastomosis, salmon colored epithelium coming up a good centimeter to a centimeter and a half above the suture line, islands of salmon colored epithelium in the most poximal residual esophagus, remainder of gastric mucosa appeared normal. bx= swamous &gastric glandular mucosa w/chronic  active inflammation   ESOPHAGOGASTRODUODENOSCOPY  03/17/2009   Dr.Rourk- 4cm segment of salmon-colored epithlium distal esophagus suspicious for barretts esophagus. area of suspicious nodularity w/in this segment bx seperately. small to moderate size hiatal hernia, o/w normal stomach D1 and D2 bx=adenocarcinoma   ESOPHAGOGASTRODUODENOSCOPY (EGD) WITH PROPOFOL N/A 10/24/2017   Dr. [name]: Remnant of esophagus with anastomosis with stomach at 24 cm from the incisors, 2 cm above the anastomosis he was found to have Barrett's but no dysplasia.  Advised for repeat EGD in 3 years   FEMORAL-POPLITEAL BYPASS GRAFT  1993   occlusive ds in R SFA   GASTRIC PULL THROUGH  2010   With esophagectomy   LIPOMA EXCISION  2010   POLYPECTOMY  10/24/2017   Procedure: POLYPECTOMY;  Surgeon: [name], MD;  Location: [facility];  Service: Endoscopy;;  colon    TOTAL HIP ARTHROPLASTY Left 01/30/2019   Procedure: TOTAL HIP ARTHROPLASTY ANTERIOR APPROACH;  Surgeon: Renette Butters, MD;  Location: [facility];  Service: Orthopedics;  Laterality: Left;     Home Medications:  Prior to Admission medications   Medication Sig Start Date End Date Taking? Authorizing Provider  acetaminophen (TYLENOL) 500 MG tablet Take 500 mg by mouth daily as needed for headache.   Yes [provider]  albuterol (PROVENTIL) (2.5 MG/3ML) 0.083% nebulizer solution Take 3 mLs (2.5 mg total) by nebulization every 6 (six) hours as needed for wheezing or shortness of breath. DX: J44.9 11/05/20  Yes Margaretha Seeds, MD  ALPRAZolam Duanne Moron) 0.5 MG tablet Take 1 tablet (0.5 mg total) by mouth at bedtime. 10/07/14  Yes Troy Sine, MD  amLODipine (NORVASC) 10 MG tablet Take 1 tablet (10 mg total) by mouth daily. 04/09/20  Yes Troy Sine, MD  aspirin EC 81 MG tablet Take 1 tablet (81 mg total) by mouth 2 (two) times daily. For DVT prophylaxis for 30 days after surgery. Patient taking differently: Take 81 mg by mouth daily. 01/30/19  Yes  Martensen, Charna Elizabeth III, PA-C  baclofen (LIORESAL) 10 MG tablet Take 1 tablet (10 mg total) by mouth 3 (three) times daily as needed for muscle spasms. 01/30/19  Yes Prudencio Burly III, PA-C  brimonidine (ALPHAGAN) 0.2 % ophthalmic solution Place 1 drop into the right eye at bedtime.    Yes [provider]  clopidogrel (PLAVIX) 75 MG tablet TAKE 1 TABLET BY MOUTH EVERY DAY Patient taking differently: Take 75 mg by mouth. TAKE 1 TABLET BY MOUTH EVERY DAY 04/09/20  Yes Troy Sine, MD  DEXILANT 60 MG capsule TAKE ONE CAPSULE (60MG  TOTAL) BY MOUTH DAILY BEFORE SUPPER Patient taking differently: Take 60 mg by mouth as needed (takes 2-3 times per week). 08/29/19  Yes Annitta Needs, NP  ezetimibe (ZETIA) 10 MG tablet TAKE ONE (1) TABLET BY MOUTH EVERY DAY Patient taking differently: Take 10 mg by mouth daily. 10/31/20  Yes Troy Sine, MD  Glycopyrrolate-Formoterol (BEVESPI AEROSPHERE) 9-4.8 MCG/ACT AERO Inhale 2 puffs into the lungs 2 (two) times daily. 11/05/20  Yes Margaretha Seeds, MD  guaiFENesin (MUCINEX) 600 MG 12 hr tablet Take 1,200 mg by mouth every 12 (twelve) hours as needed (cough).    Yes [provider]  latanoprost (XALATAN) 0.005 % ophthalmic solution Place 1 drop into both eyes at bedtime.  01/10/13  Yes [provider]  losartan (COZAAR) 100 MG tablet TAKE ONE TABLET BY MOUTH EVERY NIGHT AT BEDTIME Patient taking differently: Take 100 mg by mouth daily. 02/13/20  Yes [name], PA  Meloxicam (MOBIC PO) Take 15 mg by mouth daily.   Yes [provider]  metoprolol succinate (TOPROL-XL) 50 MG 24 hr tablet TAKE ONE (1) TABLET BY MOUTH EVERY DAY Patient taking differently: Take 50 mg by mouth daily. TAKE ONE (1) TABLET BY MOUTH EVERY DAY 02/04/20  Yes Troy Sine, MD  montelukast (SINGULAIR) 10 MG tablet Take 1 tablet (10 mg total) by mouth daily. 11/05/20  Yes Margaretha Seeds, MD  Multiple Vitamins-Minerals (CENTRUM SILVER PO) Take 1  tablet by mouth daily.   Yes [provider]  nitroGLYCERIN (NITROSTAT) 0.4 MG SL tablet Place 1 tablet (0.4 mg total) under the tongue every 5 (five) minutes as needed for chest pain. 01/05/18  Yes Meng, Isaac Laud, PA  rosuvastatin (CRESTOR) 10 MG tablet TAKE ONE (1) TABLET BY MOUTH EVERY DAY Patient taking differently: Take 10 mg by mouth daily. TAKE ONE (1) TABLET BY MOUTH EVERY DAY 02/13/20  Yes [name], [place]  spironolactone (ALDACTONE) 25 MG tablet Take 0.5 tablets (12.5 mg total) by mouth daily. 04/09/20 01/01/21 Yes Troy Sine, MD  diclofenac sodium (VOLTAREN) 1 % GEL Apply 2 g topically 4 (four) times daily. Rub into affected area of foot 2 to 4 times daily Patient not taking: No sig reported 10/30/18   Trula Slade, DPM  polyethylene glycol-electrolytes (NULYTELY) 420 g solution As directed 12/22/20   Rourk, Cristopher Estimable, MD    Inpatient Medications: Scheduled Meds:  ALPRAZolam  0.5 mg Oral QHS   arformoterol  15 mcg Nebulization BID   And   umeclidinium bromide  1 puff Inhalation Daily   aspirin EC  81 mg Oral Daily   brimonidine  1 drop Right Eye Daily   clopidogrel  75 mg Oral Daily   enoxaparin (LOVENOX) injection  40 mg Subcutaneous Q24H   latanoprost  1 drop Both Eyes QHS   rosuvastatin  10 mg Oral Daily   Continuous Infusions:  PRN Meds: acetaminophen **OR** acetaminophen, ipratropium-albuterol, ondansetron **OR** ondansetron (ZOFRAN) IV, polyethylene glycol  Allergies:    Allergies  Allergen Reactions   Altace [Ramipril] Cough    Social History:   Social History   Socioeconomic History   Marital status: Married    Spouse name: Not on file   Number of children: 2   Years of education: Not on file   Highest education level: Not on file  Occupational History   Occupation: retired    Fish farm manager: RETIRED    Comment: truck driver  Tobacco Use   Smoking status: Former    Packs/day: 2.00    Years: 40.00    Pack years: 80.00    Types: Cigarettes    Start  date: 1957    Quit date: 07/20/1995    Years since quitting: 25.4   Smokeless tobacco: Never  Vaping Use   Vaping Use: Never used  Substance and Sexual Activity   Alcohol use: No    Alcohol/week: 0.0 standard drinks   Drug use: No   Sexual activity: Yes    Partners: Female    Birth control/protection: Condom    Comment: friend  Other Topics Concern   Not on file  Social History Narrative   Not on file   Social Determinants of Health   Financial Resource Strain: Not on file  Food Insecurity: Not on file  Transportation Needs: Not on file  Physical Activity: Not on file  Stress: Not on file  Social Connections: Not on file  Intimate Partner Violence: Not on file    Family History:    Family History  Problem Relation Age of Onset   GER disease Mother    Coronary artery disease Brother    Congenital heart disease Sister    Colon cancer Neg Hx      ROS:  Please see the history of present illness.   All other ROS reviewed and negative.     Physical Exam/Data:   Vitals:   01/02/21 0403 01/02/21 0607 01/02/21 0758 01/02/21 0926  BP: 132/64   131/61  Pulse: 82   92  Resp: 18   12  Temp: 98 F (36.7 C)   98.3 F (36.8 C)  TempSrc: Oral   Oral  SpO2: 92%  93%   Weight:  72.2 kg    Height:        Intake/Output Summary (Last 24 hours) at 01/02/2021 0950 Last data filed at 01/02/2021 0300 Gross per 24 hour  Intake 2036.67 ml  Output --  Net 2036.67 ml   Last 3 Weights 01/02/2021 01/01/2021 01/01/2021  Weight (lbs) 159 lb 3.2 oz 156 lb 4.9 oz 158 lb  Weight (kg) 72.213 kg 70.9 kg 71.668 kg     Body mass index is 24.21 kg/m.  General:  Well nourished, well developed male appearing in no acute distress HEENT: normal Lymph: no adenopathy Neck: no JVD Endocrine:  No thryomegaly Vascular: No carotid bruits; Distal pulses 1+.  Cardiac:  normal S1, S2; RRR; 2/6 SEM along RUSB Lungs:  clear to auscultation bilaterally, no wheezing, rhonchi or rales  Abd: soft,  nontender, no hepatomegaly  Ext: no edema Musculoskeletal:  No deformities, BUE and BLE strength normal and equal Skin: warm and dry  Neuro:  CNs 2-12 intact, no focal abnormalities noted Psych:  Normal affect   EKG:  The EKG was personally reviewed and demonstrates: NSR with 1st degree AV Block, HR 65 and known RBBB.  Telemetry:  Telemetry was personally reviewed and demonstrates:  NSR, HR in 70's to 80's with occasional PVC's.   Relevant CV Studies:  NST: 07/2013 Rest ECG: NSR - Normal EKG   Stress ECG: No significant change from baseline ECG   QPS Raw Data Images:  Normal; no motion artifact; normal heart/lung ratio. Stress Images:  Normal homogeneous uptake in all areas of the myocardium. Rest Images:  Normal homogeneous uptake in all areas of the myocardium. Subtraction (SDS):  No evidence of ischemia. LV Wall Motion:  NL LV Function; NL Wall Motion   Impression Exercise Capacity:  Lexiscan with no exercise. BP Response:  Normal blood pressure response. Clinical Symptoms:  No significant symptoms noted. ECG Impression:  No significant ST segment change suggestive of ischemia. Comparison with Prior Nuclear Study: No significant change from previous study     Overall Impression:  Normal stress nuclear study.    Carotid Dopplers: 03/2020 Summary:  Right Carotid: Velocities in the right ICA are consistent with a 1-39%  stenosis.                 Non-hemodynamically significant plaque <50% noted in the  CCA.   Left Carotid: Velocities in the left ICA are consistent with a 40-59%  stenosis.                Non-hemodynamically significant plaque <50% noted in the  CCA.                Stenosis based on peak systolic velocities.    Echocardiogram: 03/2020 IMPRESSIONS     1. Left ventricular ejection fraction, by estimation, is 60 to 65%. The  left ventricle has normal function. The left ventricle has no regional  wall motion abnormalities. There is mild left  ventricular hypertrophy.  Left ventricular diastolic parameters  are indeterminate.   2. Right ventricular systolic function is normal. The right ventricular  size is normal.   3. The mitral valve is normal in structure. No evidence of mitral valve  regurgitation. No evidence of mitral stenosis.   4. The aortic valve is tricuspid. Aortic valve regurgitation is not  visualized. No aortic stenosis is present.   5. The inferior vena cava is normal in size with greater than 50%  respiratory variability, suggesting right atrial pressure of 3 mmHg.    Laboratory Data:  High Sensitivity Troponin:   Recent Labs  Lab 12/25/20 1226 12/25/20 1442 01/01/21 1405 01/01/21 1611  TROPONINIHS 3 3 3 3      Chemistry Recent Labs  Lab 01/01/21 1405 01/02/21 8469  NA 140 138  K 4.1 4.0  CL 109 108  CO2 25 23  GLUCOSE 101* 110*  BUN 17 12  CREATININE 1.09 0.77  CALCIUM 9.1 8.5*  GFRNONAA >60 >60  ANIONGAP 6 7    Recent Labs  Lab 01/01/21 1405  PROT 6.8  ALBUMIN 3.9  AST 22  ALT 23  ALKPHOS 64  BILITOT 0.4   Hematology Recent Labs  Lab 01/01/21 1405 01/02/21 0609  WBC 11.6* 10.4  RBC 4.94 4.46  HGB 15.2 13.7  HCT 46.6 41.6  MCV 94.3 93.3  MCH 30.8 30.7  MCHC 32.6 32.9  RDW 13.1 13.0  PLT 309 274   BNPNo results for input(s): BNP, PROBNP in the last 168 hours.  DDimer No results for input(s): DDIMER in the last 168 hours.   Radiology/Studies:  CT Angio Chest Pulmonary Embolism (PE) W or WO Contrast  Result Date: 01/01/2021 CLINICAL DATA:  Syncopal episode with dyspnea on exertion. Shortness of breath. History of COPD. EXAM: CT ANGIOGRAPHY CHEST WITH CONTRAST TECHNIQUE: Multidetector CT imaging of the chest was performed using the standard protocol during bolus administration of intravenous contrast. Multiplanar CT image reconstructions and MIPs were obtained to evaluate the vascular anatomy. CONTRAST:  166mL OMNIPAQUE IOHEXOL 350 MG/ML SOLN COMPARISON:  04/07/2014  FINDINGS: Cardiovascular: There is good opacification of the central and segmental pulmonary arteries. No focal filling defects. No evidence of significant pulmonary embolus. Normal heart size. No pericardial effusions. Normal caliber thoracic aorta with aortic calcification. No dissection. Great vessel origins are patent. Mediastinum/Nodes: Postoperative changes consistent with coronary bypass. Postoperative changes with esophagectomy and gastric pull-through. No evidence of anastomotic stricture. No significant lymphadenopathy. Lungs/Pleura: Emphysematous changes throughout the lungs. No airspace disease or consolidation. Linear scarring or atelectasis in the lung bases. Nodule in the superior segment of the right lower lung measuring 9 mm diameter. No pleural effusions. Upper Abdomen: No acute abnormalities demonstrated in the visualized upper abdomen. Musculoskeletal: Postoperative median sternotomy. Degenerative changes in the spine. No destructive bone lesions. Review of the MIP images confirms the above findings. IMPRESSION: 1. No evidence of significant pulmonary embolus. 2. Postoperative changes with esophagectomy and gastric pull up procedure. 3. Emphysematous changes in the lungs. Scarring or atelectasis in the lung bases. 4. 9 mm nodule in the superior segment of the right lower lung. No change since previous study. Long-term stability suggests benign etiology. Electronically Signed   By: Lucienne Capers M.D.   On: 01/01/2021 18:22   DG Chest Port 1 View  Result Date: 01/01/2021 CLINICAL DATA:  Weakness.  Hypotension. EXAM: PORTABLE CHEST 1 VIEW COMPARISON:  12/25/2020 FINDINGS: Previous median sternotomy and CABG. Heart size upper limits of normal. Mediastinal density related to previous gastric pull-through procedure. Chronic emphysema and pulmonary scarring. No sign of active infiltrate, mass, effusion or acute collapse. IMPRESSION: No acute finding. Previous CABG. Background emphysema with  pulmonary scarring. Previous gastric pull-through procedure. Electronically Signed   By: Nelson Chimes M.D.   On: 01/01/2021 13:54     Assessment and Plan:   1. Syncope - His episodes seem most consistent with orthostasis in the setting of dehydration/diuretic use as SBP was in the 60's per EMS report and symptoms and BP quickly improved with administration of IVF. Creatinine also improved from 1.09 to 0.77. He was off his Spironolactone for 3 weeks and just restarted this prior to his presyncopal episode on 12/25/2020.  - He feels back to baseline after receiving IVF on admission. Would check orthostatic vitals but  aware these will not be accurate to represent values on admission given he has already received IVF. Echocardiogram is pending to assess for structural abnormalities but his presentation seems atypical for cardiogenic syncope. Would recommend holding Spironolactone at discharge and he was encouraged to follow his vitals at home. If he has recurrent presyncope or syncope after stopping it, could consider an event monitor as an outpatient but he has been in NSR this admission on telemetry. He already has a follow-up visit with Dr. Claiborne Billings scheduled for next month.   2. CAD - He is s/p CABG in 1998 with low-risk NST in 07/2013. He does report stable dyspnea on exertion but has known emphysema as well. Denies any exertional chest pain and has not used SL NTG recently.  - Hs Troponin values have been negative. Echocardiogram is pending to assess for any structural abnormalities.  - Continue ASA, Plavix, Crestor and would restart Toprol-XL at discharge.   3. PAD - He is s/p fem-pop bypass in 1993 and known occluded right SFA. Reports having stable claudication symptoms. Followed by Dr. Claiborne Billings as an outpatient with plans for repeat ABI/Doppler study later this year. Remains on ASA, Plavix and statin therapy.   4. HTN - He was hypotensive on admission but this resolved with administration of IVF.  Would anticipate restarting Amlodipine, Toprol-XL  and Losartan at the time of discharge but holding Spironolactone. I did encourage him to follow his readings at home.   5. HLD - Followed by his PCP as an outpatient. He has been continued on PTA Crestor 10mg  daily.    For questions or updates, please contact [place] Please consult [email or web address] for contact info under    Signed, Erma Heritage, PA-C  01/02/2021 9:50 AM  Attending note Patient seen and discussed with PA Ahmed Prima, I agree with her documentation. 75 yo male history of CAD with prior CABG, prior esophageal cancer, HTN,HL, PAD, carotid stenosis admitted with syncope. Multiple episodes primarily associated with standing or being on his feet.   WBC 11.6 Hgb 15.2 Plt 309 K 4.1 Cr 1.09 (baseline 0.79) Trop 3-->3 COVID neg Cxr no acute process CT PE: no PE EKG SR, 1st degree AV block, RBBB Echo pending    Labs would suggest he was dry on presnetation (Cr above baseline, hemoconcentrated). Hypotensive on presentation that also resolved with IVFs. Perhaps related to restarting aldactone, agree with discontuing. We discussed increased oral hydration.  If benign echo would not plan for any further inpatient workup, if recurrence would consider outpatient monitor. Tele here has been benign.   Addendum 1102 AM Normal echo. Would restart his home beta blocker and ARB, can hold norvasc for now likely restart at f/u pending bp's. Needs to stay off aldactone. Keep 02/10/21 appt with Dr Claiborne Billings. [place] for discharge, we will sign off.  Carlyle Dolly MD

## 2021-01-02 NOTE — Progress Notes (Signed)
Pt report received from night shift nurse. Pt noted to be up OOB at sink getting washed up and brushing teeth. Ambulatory without assistance, denies any lightheadedness/dizziness or weakness. States is hoping that he will be able to go home after his echocardiogram and sees the cardiologist. Pt aware and agreeable to NPO status due to cardiology consult and potential for other procedure. Pt states will notify staff if any pain, weakness or dizziness. Pt notified that bed alarm will remain on and we ask that he call for assistance prior to getting OOB due to recent history of low b/p and syncope. Pt states understanding.

## 2021-01-02 NOTE — Discharge Summary (Signed)
Physician Discharge Summary  Gregory Eaton SNK:[id number] DOB: March 30, 1946 DOA: 01/01/2021  PCP: Gregory Rim, MD  Admit date: 01/01/2021 Discharge date: 01/02/2021  Discharge disposition: Home   Recommendations for Outpatient Follow-Up:   Follow-up with PCP in 1 week. Follow-up with cardiologist as scheduled.   Discharge Diagnosis:   Principal Problem:   Syncope Active Problems:   Essential hypertension   Peripheral vascular disease (HCC)   COPD with emphysema (HCC)   S/P CABG x 5    Discharge Condition: Stable.  Diet recommendation:  Diet Order             Diet Heart Room service appropriate? Yes; Fluid consistency: Thin  Diet effective now           Diet - low sodium heart healthy                     Code Status: Full Code     Hospital Course:   [name] is a 75 year old man with medical history significant for CAD s/p CABG x5, COPD, PVD, hypertension, who was brought to the hospital because of syncope.  He has a history of multiple episodes of syncope.  This is usually preceded by clamminess and diaphoresis.  Patient had 2 episodes of syncope on the day prior to admission. Reportedly, when EMS picked him up, he was hypotensive with systolic BP of 62.  In the emergency room, his blood pressure was 65/34 on arrival.  He was admitted to the hospital for syncope likely secondary to hypotension.  He was treated with IV fluids and hypotension has resolved.  No further episodes of syncope in the hospital.  Troponins were negative and acute PE was ruled out.  2D echo showed normal EF estimated at 55 to 60% and grade 1 diastolic dysfunction.  There was no evidence of orthostatic hypotension after volume repletion.    He was evaluated by the cardiologist.  Norvasc and Aldactone were discontinued at the recommendation of the cardiologist.  His condition has improved and he is deemed stable for discharge to home today.  He was able to ambulate in the  hallway without any symptoms.   Medical Consultants:   Cardiologist, Dr. Harl Eaton   Discharge Exam:    Vitals:   01/02/21 0403 01/02/21 9379 01/02/21 0758 01/02/21 0926  BP: 132/64   131/61  Pulse: 82   92  Resp: 18   12  Temp: 98 F (36.7 C)   98.3 F (36.8 C)  TempSrc: Oral   Oral  SpO2: 92%  93%   Weight:  72.2 kg    Height:         GEN: NAD SKIN: Warm and dry.  Soft tissue swelling in the left upper back (? lipoma-he has had surgery for this in the past) EYES: EOMI ENT: MMM CV: RRR PULM: CTA B ABD: soft, ND, NT, +BS CNS: AAO x 3, non focal EXT: No edema or tenderness   The results of significant diagnostics from this hospitalization (including imaging, microbiology, ancillary and laboratory) are listed below for reference.     Procedures and Diagnostic Studies:   CT Angio Chest Pulmonary Embolism (PE) W or WO Contrast  Result Date: 01/01/2021 CLINICAL DATA:  Syncopal episode with dyspnea on exertion. Shortness of breath. History of COPD. EXAM: CT ANGIOGRAPHY CHEST WITH CONTRAST TECHNIQUE: Multidetector CT imaging of the chest was performed using the standard protocol during bolus administration of intravenous contrast. Multiplanar CT image reconstructions and  MIPs were obtained to evaluate the vascular anatomy. CONTRAST:  169mL OMNIPAQUE IOHEXOL 350 MG/ML SOLN COMPARISON:  04/07/2014 FINDINGS: Cardiovascular: There is good opacification of the central and segmental pulmonary arteries. No focal filling defects. No evidence of significant pulmonary embolus. Normal heart size. No pericardial effusions. Normal caliber thoracic aorta with aortic calcification. No dissection. Great vessel origins are patent. Mediastinum/Nodes: Postoperative changes consistent with coronary bypass. Postoperative changes with esophagectomy and gastric pull-through. No evidence of anastomotic stricture. No significant lymphadenopathy. Lungs/Pleura: Emphysematous changes throughout the lungs. No  airspace disease or consolidation. Linear scarring or atelectasis in the lung bases. Nodule in the superior segment of the right lower lung measuring 9 mm diameter. No pleural effusions. Upper Abdomen: No acute abnormalities demonstrated in the visualized upper abdomen. Musculoskeletal: Postoperative median sternotomy. Degenerative changes in the spine. No destructive bone lesions. Review of the MIP images confirms the above findings. IMPRESSION: 1. No evidence of significant pulmonary embolus. 2. Postoperative changes with esophagectomy and gastric pull up procedure. 3. Emphysematous changes in the lungs. Scarring or atelectasis in the lung bases. 4. 9 mm nodule in the superior segment of the right lower lung. No change since previous study. Long-term stability suggests benign etiology. Electronically Signed   By: Lucienne Capers M.D.   On: 01/01/2021 18:22   DG Chest Port 1 View  Result Date: 01/01/2021 CLINICAL DATA:  Weakness.  Hypotension. EXAM: PORTABLE CHEST 1 VIEW COMPARISON:  12/25/2020 FINDINGS: Previous median sternotomy and CABG. Heart size upper limits of normal. Mediastinal density related to previous gastric pull-through procedure. Chronic emphysema and pulmonary scarring. No sign of active infiltrate, mass, effusion or acute collapse. IMPRESSION: No acute finding. Previous CABG. Background emphysema with pulmonary scarring. Previous gastric pull-through procedure. Electronically Signed   By: Nelson Chimes M.D.   On: 01/01/2021 13:54   ECHOCARDIOGRAM COMPLETE  Result Date: 01/02/2021    ECHOCARDIOGRAM REPORT   Patient Name:   Gregory Eaton Date of Exam: 01/02/2021 Medical Rec #:  [id number]      Height:       68.0 in Accession #:    [id number]     Weight:       159.2 lb Date of Birth:  1946/02/25      BSA:          1.855 m Patient Age:    75 years       BP:           131/61 mmHg Patient Gender: M              HR:           92 bpm. Exam Location:  [facility] Procedure: 2D Echo, Cardiac Doppler  and Color Doppler Indications:    Syncope  History:        Patient has prior history of Echocardiogram examinations, most                 recent 04/14/2020. CAD, Prior CABG, COPD, PVD and Carotid                 Disease; Risk Factors:Former Smoker, Hypertension and                 Dyslipidemia.  Sonographer:    Dustin Flock RDCS Referring Phys: [place]  1. Left ventricular ejection fraction, by estimation, is 55 to 60%. The left ventricle has normal function. The left ventricle has no regional wall motion abnormalities. There is mild left ventricular hypertrophy.  Left ventricular diastolic parameters are consistent with Grade I diastolic dysfunction (impaired relaxation).  2. Right ventricular systolic function is normal. The right ventricular size is normal.  3. The mitral valve is normal in structure. No evidence of mitral valve regurgitation. No evidence of mitral stenosis.  4. The aortic valve is tricuspid. There is mild calcification of the aortic valve. There is mild thickening of the aortic valve. Aortic valve regurgitation is not visualized. No aortic stenosis is present.  5. The inferior vena cava is normal in size with greater than 50% respiratory variability, suggesting right atrial pressure of 3 mmHg. FINDINGS  Left Ventricle: Left ventricular ejection fraction, by estimation, is 55 to 60%. The left ventricle has normal function. The left ventricle has no regional wall motion abnormalities. The left ventricular internal cavity size was normal in size. There is  mild left ventricular hypertrophy. Left ventricular diastolic parameters are consistent with Grade I diastolic dysfunction (impaired relaxation). Normal left ventricular filling pressure. Right Ventricle: The right ventricular size is normal. No increase in right ventricular wall thickness. Right ventricular systolic function is normal. Left Atrium: Left atrial size was normal in size. Right Atrium: Right atrial  size was normal in size. Pericardium: There is no evidence of pericardial effusion. Mitral Valve: The mitral valve is normal in structure. No evidence of mitral valve regurgitation. No evidence of mitral valve stenosis. Tricuspid Valve: The tricuspid valve is normal in structure. Tricuspid valve regurgitation is not demonstrated. No evidence of tricuspid stenosis. Aortic Valve: The aortic valve is tricuspid. There is mild calcification of the aortic valve. There is mild thickening of the aortic valve. There is mild aortic valve annular calcification. Aortic valve regurgitation is not visualized. No aortic stenosis  is present. Aortic valve mean gradient measures 3.2 mmHg. Aortic valve peak gradient measures 6.2 mmHg. Aortic valve area, by VTI measures 2.47 cm. Pulmonic Valve: The pulmonic valve was not well visualized. Pulmonic valve regurgitation is not visualized. No evidence of pulmonic stenosis. Aorta: The aortic root is normal in size and structure. Pulmonary Artery: Indeterminant PASP, inadequate TR jet. Venous: The inferior vena cava is normal in size with greater than 50% respiratory variability, suggesting right atrial pressure of 3 mmHg. IAS/Shunts: No atrial level shunt detected by color flow Doppler.  LEFT VENTRICLE PLAX 2D LVIDd:         2.87 cm  Diastology LVIDs:         2.05 cm  LV e' medial:    8.94 cm/s LV PW:         1.10 cm  LV E/e' medial:  10.4 LV IVS:        1.10 cm  LV e' lateral:   10.10 cm/s LVOT diam:     2.20 cm  LV E/e' lateral: 9.2 LV SV:         65 LV SV Index:   35 LVOT Area:     3.80 cm  RIGHT VENTRICLE RV Basal diam:  2.74 cm RV S prime:     8.24 cm/s TAPSE (M-mode): 1.5 cm LEFT ATRIUM             Index       RIGHT ATRIUM           Index LA diam:        3.70 cm 1.99 cm/m  RA Area:     13.20 cm LA Vol (A2C):   31.9 ml 17.20 ml/m RA Volume:   30.20 ml  16.28 ml/m LA  Vol (A4C):   51.7 ml 27.87 ml/m LA Biplane Vol: 43.4 ml 23.40 ml/m  AORTIC VALVE AV Area (Vmax):    2.59 cm AV  Area (Vmean):   2.32 cm AV Area (VTI):     2.47 cm AV Vmax:           124.11 cm/s AV Vmean:          84.823 cm/s AV VTI:            0.263 m AV Peak Grad:      6.2 mmHg AV Mean Grad:      3.2 mmHg LVOT Vmax:         84.50 cm/s LVOT Vmean:        51.700 cm/s LVOT VTI:          0.171 m LVOT/AV VTI ratio: 0.65  AORTA Ao Root diam: 2.90 cm MITRAL VALVE MV Area (PHT): 9.14 cm     SHUNTS MV Decel Time: 83 msec      Systemic VTI:  0.17 m MV E velocity: 92.70 cm/s   Systemic Diam: 2.20 cm MV A velocity: 112.00 cm/s MV E/A ratio:  0.83 Carlyle Dolly MD Electronically signed by Carlyle Dolly MD Signature Date/Time: 01/02/2021/11:01:45 AM    Final      Labs:   Basic Metabolic Panel: Recent Labs  Lab 01/01/21 1405 01/02/21 0609  NA 140 138  K 4.1 4.0  CL 109 108  CO2 25 23  GLUCOSE 101* 110*  BUN 17 12  CREATININE 1.09 0.77  CALCIUM 9.1 8.5*   GFR Estimated Creatinine Clearance: 76 mL/min (by C-G formula based on SCr of 0.77 mg/dL). Liver Function Tests: Recent Labs  Lab 01/01/21 1405  AST 22  ALT 23  ALKPHOS 64  BILITOT 0.4  PROT 6.8  ALBUMIN 3.9   No results for input(s): LIPASE, AMYLASE in the last 168 hours. No results for input(s): AMMONIA in the last 168 hours. Coagulation profile No results for input(s): INR, PROTIME in the last 168 hours.  CBC: Recent Labs  Lab 01/01/21 1405 01/02/21 0609  WBC 11.6* 10.4  NEUTROABS 9.3*  --   HGB 15.2 13.7  HCT 46.6 41.6  MCV 94.3 93.3  PLT 309 274   Cardiac Enzymes: No results for input(s): CKTOTAL, CKMB, CKMBINDEX, TROPONINI in the last 168 hours. BNP: Invalid input(s): POCBNP CBG: No results for input(s): GLUCAP in the last 168 hours. D-Dimer No results for input(s): DDIMER in the last 72 hours. Hgb A1c No results for input(s): HGBA1C in the last 72 hours. Lipid Profile No results for input(s): CHOL, HDL, LDLCALC, TRIG, CHOLHDL, LDLDIRECT in the last 72 hours. Thyroid function studies No results for input(s): TSH,  T4TOTAL, T3FREE, THYROIDAB in the last 72 hours.  Invalid input(s): FREET3 Anemia work up No results for input(s): VITAMINB12, FOLATE, FERRITIN, TIBC, IRON, RETICCTPCT in the last 72 hours. Microbiology Recent Results (from the past 240 hour(s))  SARS CORONAVIRUS 2 (TAT 6-24 HRS) Nasopharyngeal Nasopharyngeal Swab     Status: None   Collection Time: 12/25/20  3:04 PM   Specimen: Nasopharyngeal Swab  Result Value Ref Range Status   SARS Coronavirus 2 NEGATIVE NEGATIVE Final    Comment: (NOTE) SARS-CoV-2 target nucleic acids are NOT DETECTED.  The SARS-CoV-2 RNA is generally detectable in upper and lower respiratory specimens during the acute phase of infection. Negative results do not preclude SARS-CoV-2 infection, do not rule out co-infections with other pathogens, and should not be used as the sole  basis for treatment or other patient management decisions. Negative results must be combined with clinical observations, patient history, and epidemiological information. The expected result is Negative.  Fact Sheet for Patients: SugarRoll.be  Fact Sheet for Healthcare Providers: https://www.woods-mathews.com/  This test is not yet approved or cleared by the [place] FDA and  has been authorized for detection and/or diagnosis of SARS-CoV-2 by FDA under an Emergency Use Authorization (EUA). This EUA will remain  in effect (meaning this test can be used) for the duration of the COVID-19 declaration under Se ction 564(b)(1) of the Act, 21 U.S.C. section 360bbb-3(b)(1), unless the authorization is terminated or revoked sooner.  Performed at [facility], [place] [address] [place], [place] [address]   SARS CORONAVIRUS 2 (TAT 6-24 HRS) Nasopharyngeal Nasopharyngeal Swab     Status: None   Collection Time: 01/01/21  5:11 PM   Specimen: Nasopharyngeal Swab  Result Value Ref Range Status   SARS Coronavirus 2 NEGATIVE NEGATIVE Final     Comment: (NOTE) SARS-CoV-2 target nucleic acids are NOT DETECTED.  The SARS-CoV-2 RNA is generally detectable in upper and lower respiratory specimens during the acute phase of infection. Negative results do not preclude SARS-CoV-2 infection, do not rule out co-infections with other pathogens, and should not be used as the sole basis for treatment or other patient management decisions. Negative results must be combined with clinical observations, patient history, and epidemiological information. The expected result is Negative.  Fact Sheet for Patients: SugarRoll.be  Fact Sheet for Healthcare Providers: https://www.woods-mathews.com/  This test is not yet approved or cleared by the [place] FDA and  has been authorized for detection and/or diagnosis of SARS-CoV-2 by FDA under an Emergency Use Authorization (EUA). This EUA will remain  in effect (meaning this test can be used) for the duration of the COVID-19 declaration under Se ction 564(b)(1) of the Act, 21 U.S.C. section 360bbb-3(b)(1), unless the authorization is terminated or revoked sooner.  Performed at [facility], [place] [address] [place], [place] [address]      Discharge Instructions:   Discharge Instructions     Diet - low sodium heart healthy   Complete by: As directed    Increase activity slowly   Complete by: As directed       Allergies as of 01/02/2021       Reactions   Altace [ramipril] Cough        Medication List     STOP taking these medications    amLODipine 10 MG tablet Commonly known as: NORVASC   diclofenac sodium 1 % Gel Commonly known as: VOLTAREN   spironolactone 25 MG tablet Commonly known as: ALDACTONE       TAKE these medications    acetaminophen 500 MG tablet Commonly known as: TYLENOL Take 500 mg by mouth daily as needed for headache.   albuterol (2.5 MG/3ML) 0.083% nebulizer solution Commonly known as:  PROVENTIL Take 3 mLs (2.5 mg total) by nebulization every 6 (six) hours as needed for wheezing or shortness of breath. DX: J44.9   ALPRAZolam 0.5 MG tablet Commonly known as: XANAX Take 1 tablet (0.5 mg total) by mouth at bedtime.   aspirin EC 81 MG tablet Take 1 tablet (81 mg total) by mouth daily.   baclofen 10 MG tablet Commonly known as: LIORESAL Take 1 tablet (10 mg total) by mouth 3 (three) times daily as needed for muscle spasms.   Bevespi Aerosphere 9-4.8 MCG/ACT Aero Generic drug: Glycopyrrolate-Formoterol Inhale 2 puffs  into the lungs 2 (two) times daily.   brimonidine 0.2 % ophthalmic solution Commonly known as: ALPHAGAN Place 1 drop into the right eye at bedtime.   CENTRUM SILVER PO Take 1 tablet by mouth daily.   clopidogrel 75 MG tablet Commonly known as: PLAVIX Take 1 tablet (75 mg total) by mouth daily. TAKE 1 TABLET BY MOUTH EVERY DAY What changed:  how much to take how to take this when to take this   dexlansoprazole 60 MG capsule Commonly known as: Dexilant Take 1 capsule (60 mg total) by mouth as needed (takes 2-3 times per week). What changed: See the new instructions.   ezetimibe 10 MG tablet Commonly known as: ZETIA Take 1 tablet (10 mg total) by mouth daily.   guaiFENesin 600 MG 12 hr tablet Commonly known as: MUCINEX Take 1,200 mg by mouth every 12 (twelve) hours as needed (cough).   latanoprost 0.005 % ophthalmic solution Commonly known as: XALATAN Place 1 drop into both eyes at bedtime.   losartan 100 MG tablet Commonly known as: COZAAR Take 1 tablet (100 mg total) by mouth daily.   metoprolol succinate 50 MG 24 hr tablet Commonly known as: TOPROL-XL Take 1 tablet (50 mg total) by mouth daily. TAKE ONE (1) TABLET BY MOUTH EVERY DAY   MOBIC PO Take 15 mg by mouth daily.   montelukast 10 MG tablet Commonly known as: SINGULAIR Take 1 tablet (10 mg total) by mouth daily.   nitroGLYCERIN 0.4 MG SL tablet Commonly known as:  Nitrostat Place 1 tablet (0.4 mg total) under the tongue every 5 (five) minutes as needed for chest pain.   polyethylene glycol-electrolytes 420 g solution Commonly known as: NuLYTELY As directed   rosuvastatin 10 MG tablet Commonly known as: CRESTOR Take 1 tablet (10 mg total) by mouth daily. TAKE ONE (1) TABLET BY MOUTH EVERY DAY          Time coordinating discharge: 32 minutes  Signed:  Dominique Ressel  [facility] 01/02/2021, 12:03 PM   Pager on [email or web address]. If 7PM-7AM, please contact night-coverage at [email or web address]

## 2021-01-21 NOTE — Pre-Procedure Instructions (Signed)
        chart review Received: Today Encarnacion Chu, RN  Rourk, Cristopher Estimable, MD [id number], Wayne Both, CMA Hey Dr [name]! Will you review Gregory Eaton chart for me please? He has had 2 presyncopal episodes that have had EMS take him to ED in June. He was evaluated by Dr Harl Bowie then and his note states [name] is to follow up with cardiology at the end of July but his procedure is scheduled for 7/14. Will you look at this and see if you are comfortable doing his procedure before his cardiac follow up please? Thank you!

## 2021-01-22 ENCOUNTER — Telehealth: Payer: Self-pay

## 2021-01-22 NOTE — Telephone Encounter (Signed)
Called and informed pt of Dr. Roseanne Kaufman recommendation. He is already scheduled for OV 03/30/2021.  Procedure already cancelled.

## 2021-01-22 NOTE — Telephone Encounter (Signed)
-----   Message from [name], MD sent at 01/21/2021  3:24 PM EDT ----- Regarding: RE: chart review Multiple episodes of syncope - etiology of which not well defined.  I agree with stopping aldactone and norvasc- which has already been done.  Since upcoming endos are totally elective, we should put off until he's een seen by cardiology.  Erline Levine, lets get him scheduled for an ov with Neil Crouch in 6-  8 weeks to re-group.  thanks ----- Message ----- From: Encarnacion Chu, RN Sent: 01/21/2021   2:32 PM EDT To: Mindy Mickle Asper, CMA, [name], MD Subject: chart review                                   Hey Dr [name]! Will you review Gregory Eaton chart for me please? He has had 2 presyncopal episodes that have had EMS take him to ED in June. He was evaluated by Dr Harl Bowie then and his note states Gregory Eaton is to follow up with cardiology at the end of July but his procedure is scheduled for 7/14. Will you look at this and see if you are comfortable doing his procedure before his cardiac follow up please? Thank you!

## 2021-01-23 ENCOUNTER — Encounter (HOSPITAL_COMMUNITY)
Admission: RE | Admit: 2021-01-23 | Discharge: 2021-01-23 | Disposition: A | Discharge status: Home / Self Care | Payer: Medicare Other | Source: Ambulatory Visit | Attending: Internal Medicine | Admitting: Internal Medicine

## 2021-01-27 ENCOUNTER — Other Ambulatory Visit: Payer: Self-pay | Admitting: Cardiovascular Disease

## 2021-01-29 ENCOUNTER — Ambulatory Visit (HOSPITAL_COMMUNITY): Admit: 2021-01-29 | Payer: Medicare Other | Admitting: Internal Medicine

## 2021-01-29 ENCOUNTER — Encounter (HOSPITAL_COMMUNITY): Payer: Self-pay

## 2021-01-29 SURGERY — COLONOSCOPY WITH PROPOFOL
Anesthesia: Monitor Anesthesia Care

## 2021-02-10 ENCOUNTER — Other Ambulatory Visit: Payer: Self-pay

## 2021-02-10 ENCOUNTER — Ambulatory Visit: Payer: Medicare Other | Admitting: Cardiovascular Disease

## 2021-02-10 ENCOUNTER — Encounter: Payer: Self-pay | Admitting: Cardiovascular Disease

## 2021-02-10 ENCOUNTER — Telehealth: Payer: Self-pay

## 2021-02-10 DIAGNOSIS — I739 Peripheral vascular disease, unspecified: Secondary | ICD-10-CM

## 2021-02-10 DIAGNOSIS — I2510 Atherosclerotic heart disease of native coronary artery without angina pectoris: Secondary | ICD-10-CM

## 2021-02-10 DIAGNOSIS — R079 Chest pain, unspecified: Secondary | ICD-10-CM

## 2021-02-10 DIAGNOSIS — E785 Hyperlipidemia, unspecified: Secondary | ICD-10-CM

## 2021-02-10 DIAGNOSIS — Z951 Presence of aortocoronary bypass graft: Secondary | ICD-10-CM | POA: Diagnosis not present

## 2021-02-10 DIAGNOSIS — I10 Essential (primary) hypertension: Secondary | ICD-10-CM

## 2021-02-10 DIAGNOSIS — I6522 Occlusion and stenosis of left carotid artery: Secondary | ICD-10-CM

## 2021-02-10 MED ORDER — NITROGLYCERIN 0.4 MG SL SUBL
0.4000 mg | SUBLINGUAL_TABLET | SUBLINGUAL | 0 refills | Status: AC | PRN
Start: 2021-02-10 — End: ?

## 2021-02-10 NOTE — Patient Instructions (Signed)
Medication Instructions:  Your physician recommends that you continue on your current medications as directed. Please refer to the Current Medication list given to you today.  *If you need a refill on your cardiac medications before your next appointment, please call your pharmacy*   Lab Work: None ordered.    Testing/Procedures: Your physician has requested that you have a lexiscan myoview. For further information please visit [email or web address]. Please follow instruction sheet, as given.  Your physician has requested that you have a lower or upper extremity arterial duplex. This test is an ultrasound of the arteries in the legs or arms. It looks at arterial blood flow in the legs and arms. Allow one hour for Lower and Upper Arterial scans. There are no restrictions or special instructions    Follow-Up: At [facility], you and your health needs are our priority.  As part of our continuing mission to provide you with exceptional heart care, we have created designated Provider Care Teams.  These Care Teams include your primary Cardiologist (physician) and Advanced Practice Providers (APPs -  Physician Assistants and Nurse Practitioners) who all work together to provide you with the care you need, when you need it.  We recommend signing up for the patient portal called "MyChart".  Sign up information is provided on this After Visit Summary.  MyChart is used to connect with patients for Virtual Visits (Telemedicine).  Patients are able to view lab/test results, encounter notes, upcoming appointments, etc.  Non-urgent messages can be sent to your provider as well.   To learn more about what you can do with MyChart, go to [email or web address].    Your next appointment:   Follow up following your lower extremity arterial duplex and Lexiscan Myoview  The format for your next appointment:   In Person  Provider:   Shelva Majestic, MD

## 2021-02-10 NOTE — Telephone Encounter (Signed)
Called pt to let him know his nitroglycerin had been refilled. Reviewed medication instructions with patient. Patient verbalized understanding. All questions/concerns addressed at this time.

## 2021-02-10 NOTE — Progress Notes (Signed)
Patient ID: Gregory Eaton, male   DOB: 1946/06/13, 75 y.o.   MRN: [id number]   PCP: Dr. Wende Eaton  HPI: Gregory Eaton is a 75 y.o. male presents to the office today for an 8 month cardiology evaluation.  Gregory Eaton has CAD and underwent CABG revascularization surgery by Dr. Nils Eaton in 1998. In December 2010 he underwent esophageal cancer surgery at [facility]. Additional problems include hypertension as well as hyperlipidemia in addition to peripheral vascular disease. He has documented occluded left vertebral artery with normal antegrade flow the left external carotid and did have mild carotid stenoses are noted on his last Doppler study in detecting April 2013. He also is status post femoropopliteal bypass surgery and has occlusive disease in his right SFA.   A followup nuclear perfusion study on 07/24/2013 continued to reveal normal perfusion without scar or ischemia, 17 years after his CABG revascularization surgery. He had also undergone followup carotid studies which again showed an occluded left vertebral artery and mild disease in his right and left internal carotid system with narrowings less than 49%. He also is status post femoropopliteal bypass surgery. His last LE Doppler study from 07/10/2013 showed ABIs of 1.4 bilaterally at his ankles. He did have occlusive disease with reconstitution above the knee and the right SFA. His ABI values were now noncompressible.  Laboratory in August 2015 by his primary physician revealed mildly increased SGPT at 72 and an elevated calcium of 11.6.  He was hospitalized in September 2015 with pneumonia.  Followup calcium level was normal at 9.2.  LFTs were normal.  He did have a followup carotid duplex scan, which again showed a 50-69% stenosis of the left internal carotid artery.  Additionally, there was preocclusive stenosis and left external carotid.  He was hospitalized from November 7 - June 03, 2016 and was felt to have sepsis due to  pneumonia.  He initially was treated with vancomycin and Zosyn. He has a history of remote esophageal cancer and is status post surgery.  He has GERD which has been controlled with Protonix.    When I saw him in April 2018 he denied any recurrent chest pain. He saw Dr. Lenna Eaton  In 2018 for his COPD/emphysema.  There is no change in his prior right lower lobe pulmonary nodules.  He underwent lower extremity Doppler studies in April 2018, which revealed his known SFA disease.  The right ABI was within normal limits with abnormal waveforms.  The left ABI was not obtainable due to noncompressible vessels.  Bilateral great toe pressures were abnormal.  He denies any change in his claudication.  He denies any chest pain and continues to be on dual antiplatelet therapy.  He was on amlodipine 5 mg, losartan 75 mg, Toprol 50 mg for hypertension.  Losartan was titrated to 100 mg.  He was on Spiriva and  Singulair therapy for COPD.  He wason crestor for hyperlipidemia.   Since I saw him in July 2018 he has continued to do relatively well but his blood pressure has been elevated.  He underwent  follow-up  lower extremity Dopplers in April 2019.  His right ABI was slightly increased from 1 year previously and his left ABIs were unchanged.  He saw Gregory Eaton, Gregory Eaton in June 2019 and at that time he recommended that he switch taking losartan at bedtime but continue with amlodipine 5 mg and Toprol 50 mg in the morning.  In September 2019 he underwent follow-up carotid studies which showed  1 to 39% stenoses bilaterally in the internal carotids.  There was nonhemodynamically significant plaque less than 50% in the right common carotid.  The right vertebral artery demonstrated antegrade flow.  The left vertebral artery was not visualized.  Incidentally, he was noted to have 3 vascular and heterogeneous areas in the left thyroid.    I saw him in December 2019 at which time he remained stable without chest pain but his blood pressure was  elevated despite taking losartan 100 mg, amlodipine 5 mg, and Toprol-XL 50 mg.  I further titrated amlodipine to 10 mg.  He was on rosuvastatin for hyperlipidemia.  He was seen by Gregory Deforest, PA on January 15, 2019.  He was in need for total hip arthroplasty by Dr. Percell Eaton.  He was advised to hold his aspirin and Plavix for the procedure.  He recently saw Dr. Loanne Eaton for pulmonary issues and his Anoro was changed to bevespi due to insurance.   I saw him in January 2021 at which time he felt well.  He was able to be more active since his hip surgery.  He denies chest pain or wheezing.  He continues to see Dr. Wende Eaton for primary care.   I saw him in September 2021.  He had undergone carotid studies prior to his office visit.  At that time he had begun to notice progressive claudication symptoms initiating in his thighs and calves.  He would often have to stop when getting the mail which would typically be 240 feet from his house to his mailbox.  He denied any chest pain or palpitations.  He had noticed shortness of breath and had blood pressures typically running between 135 and 145.  At that time I recommended initiation of spironolactone 12.5 mg to his regimen.  I scheduled him for a follow-up echo Doppler study which was done on April 14, 2020 and showed an EF of 60 to 65%.  There was mild LVH.  Diastolic parameters were indeterminate.  He underwent lower extremity Doppler studies which revealed moderate right lower extremity arterial disease with slightly decreased ABIs and TBI's compared to prior study without change in the left side.  When I evaluated him in December 2021 he remained stable and was without chest pain.  However, he was experiencing some mild claudication symptoms particularly with uphill walking.  He was unaware of palpitations.  He continued to be on amlodipine 10 mg, metoprolol succinate 50 mg, losartan 100 mg and his blood pressure had improved with the addition of spironolactone 12.5  mg daily.  He continued to be on DAPT and was on Zetia and rosuvastatin for hyperlipidemia.    Since his last evaluation, he was hospitalized overnight on January 01, 2021 following a syncopal spell.  His blood pressure was 65/34 on arrival and he was felt to be volume depleted.  Apparently amlodipine and spironolactone were discontinued.  Chest CT was negative for PE.  Ominous changes were noted in the lungs with scarring or atelectasis at the lung bases.  A 9 mm nodule was present in the superior segment of the right lower lobe which had not changed since his previous evaluation and long-term stability suggested benign etiology.  He was discharged the following day.  Since his hospital discharge, he has been evaluated by his primary physician.  Apparently it was recommended that he resume low-dose pain and apparently he started back on 5 mg but more recently this was further titrated to 10 mg.  He continues to  be on DAPT with aspirin/Plavix.  He is on losartan 100 mg, Toprol-XL 50 mg daily.  He continues to be on Zetia and rosuvastatin 10 mg for hyperlipidemia.  Continues to experience progressive claudication symptoms.  At times he also has noticed a mild chest tightness with activity.  He presents for reevaluation.   Past Medical History:  Diagnosis Date   Adenomatous colon polyp 03/2009   Last colonoscopy by Dr. [name]    Adenomatous polyp 2010   Adenomatous polyp of colon 11/03/2010   Arthritis    Barrett's esophagus    CAD (coronary artery disease)    Complication of anesthesia    has a shortened esophogus due to CA.    COPD (chronic obstructive pulmonary disease) (HCC)    Severe emphysema per CT   Diverticulosis    Esophageal carcinoma ([place]) 03/2009   [id number]   GERD (gastroesophageal reflux disease)    Glaucoma    History of Doppler ultrasound 11/09/2011   03/2014- 50-69% L ICA stenosis;carotid doppler; L bulb/prox ICA 0-49% diameter reduction; L vertebral artery - occlusive ds; L ECA   demonstrates severe amount of fibrous plaque   History of Doppler ultrasound 11/09/11   LEAs; R ABI - mod art. insuff.; L ABI normal at rest; R SFA - occlusive ds; L SFA - occlusive ds; patent fem-pop graft   History of echocardiogram 08/27/2009   EF >55%   History of kidney stones 1965   History of nuclear stress test 11/24/2011   lexiscan; normal perfusion; low risk scan; non-diagnostic for ischemia   Hyperlipidemia    Hypertension    Left carotid artery stenosis 04/08/2014   Pulmonary nodule, right 04/08/2014   2.8 mm-incidental finding on CT   PVD (peripheral vascular disease) ([place])    Tachyarrhythmia 1999   Status post ablation at [facility]   Tobacco abuse     Past Surgical History:  Procedure Laterality Date   BIOPSY  10/24/2017   Procedure: BIOPSY;  Surgeon: [name], MD;  Location: [facility];  Service: Endoscopy;;  esophagus   COLONOSCOPY  03/17/2009   Dr.Rourk- normal rectum, sigmoid diverticula, some pale sigmoid mucosa with diffuse petechiae. pedunculated polyp at the splenic flexure, remainder of colonic mucosa appeared normal. bx= adenomatous polyp   COLONOSCOPY  04/19/2003   Dr.Rehman- few diverticu;a at the sigmoid colon, 3 small polyps, one at the transverse colon and 2 at the sigmoid, small external hemorrhoids. bx report not available.    COLONOSCOPY WITH PROPOFOL N/A 10/24/2017   Dr. [name]: Diverticulosis, 11 mm polyp at the ileocecal valve, tubular adenoma.  Repeat colonoscopy in 3 years   CORONARY ARTERY BYPASS GRAFT  1998   [name]   ESOPHAGECTOMY  2010   [facility] Dr. [name]   ESOPHAGOGASTRODUODENOSCOPY  11/25/2010   Dr.Rourk- s/p esophagectomy with gastric pull-up, esophageal erosions straddling the surgical anastomosis, salmon colored epithelium coming up a good centimeter to a centimeter and a half above the suture line, islands of salmon colored epithelium in the most poximal residual esophagus, remainder of gastric mucosa appeared normal. bx= swamous  &gastric glandular mucosa w/chronic active inflammation   ESOPHAGOGASTRODUODENOSCOPY  03/17/2009   Dr.Rourk- 4cm segment of salmon-colored epithlium distal esophagus suspicious for barretts esophagus. area of suspicious nodularity w/in this segment bx seperately. small to moderate size hiatal hernia, o/w normal stomach D1 and D2 bx=adenocarcinoma   ESOPHAGOGASTRODUODENOSCOPY (EGD) WITH PROPOFOL N/A 10/24/2017   Dr. [name]: Remnant of esophagus with anastomosis with stomach at 24 cm from the  incisors, 2 cm above the anastomosis he was found to have Barrett's but no dysplasia.  Advised for repeat EGD in 3 years   FEMORAL-POPLITEAL BYPASS GRAFT  1993   occlusive ds in R SFA   GASTRIC PULL THROUGH  2010   With esophagectomy   LIPOMA EXCISION  2010   POLYPECTOMY  10/24/2017   Procedure: POLYPECTOMY;  Surgeon: [name], MD;  Location: [facility];  Service: Endoscopy;;  colon    TOTAL HIP ARTHROPLASTY Left 01/30/2019   Procedure: TOTAL HIP ARTHROPLASTY ANTERIOR APPROACH;  Surgeon: Renette Butters, MD;  Location: [facility];  Service: Orthopedics;  Laterality: Left;    Allergies  Allergen Reactions   Altace [Ramipril] Cough    Current Outpatient Medications  Medication Sig Dispense Refill   acetaminophen (TYLENOL) 500 MG tablet Take 500 mg by mouth daily as needed for headache.     albuterol (PROVENTIL) (2.5 MG/3ML) 0.083% nebulizer solution Take 3 mLs (2.5 mg total) by nebulization every 6 (six) hours as needed for wheezing or shortness of breath. DX: J44.9 75 mL 6   ALPRAZolam (XANAX) 0.5 MG tablet Take 1 tablet (0.5 mg total) by mouth at bedtime. 30 tablet 0   aspirin EC 81 MG tablet Take 1 tablet (81 mg total) by mouth daily.     baclofen (LIORESAL) 10 MG tablet Take 1 tablet (10 mg total) by mouth 3 (three) times daily as needed for muscle spasms. 20 each 0   brimonidine (ALPHAGAN) 0.2 % ophthalmic solution Place 1 drop into the right eye at bedtime.      clopidogrel (PLAVIX) 75 MG tablet  Take 1 tablet (75 mg total) by mouth daily. TAKE 1 TABLET BY MOUTH EVERY DAY     dexlansoprazole (DEXILANT) 60 MG capsule Take 1 capsule (60 mg total) by mouth as needed (takes 2-3 times per week).     ezetimibe (ZETIA) 10 MG tablet Take 1 tablet (10 mg total) by mouth daily.     Glycopyrrolate-Formoterol (BEVESPI AEROSPHERE) 9-4.8 MCG/ACT AERO Inhale 2 puffs into the lungs 2 (two) times daily. 5.9 g 6   guaiFENesin (MUCINEX) 600 MG 12 hr tablet Take 1,200 mg by mouth every 12 (twelve) hours as needed (cough).      latanoprost (XALATAN) 0.005 % ophthalmic solution Place 1 drop into both eyes at bedtime.      losartan (COZAAR) 100 MG tablet Take 1 tablet (100 mg total) by mouth daily.     Meloxicam (MOBIC PO) Take 15 mg by mouth daily.     metoprolol succinate (TOPROL-XL) 50 MG 24 hr tablet TAKE ONE (1) TABLET BY MOUTH EVERY DAY 90 tablet 0   montelukast (SINGULAIR) 10 MG tablet Take 1 tablet (10 mg total) by mouth daily. 90 tablet 4   Multiple Vitamins-Minerals (CENTRUM SILVER PO) Take 1 tablet by mouth daily.     nitroGLYCERIN (NITROSTAT) 0.4 MG SL tablet Place 1 tablet (0.4 mg total) under the tongue every 5 (five) minutes as needed for chest pain. 25 tablet 3   polyethylene glycol-electrolytes (NULYTELY) 420 g solution As directed 4000 mL 0   rosuvastatin (CRESTOR) 10 MG tablet Take 1 tablet (10 mg total) by mouth daily. TAKE ONE (1) TABLET BY MOUTH EVERY DAY     No current facility-administered medications for this visit.    Socially he is widowed has 2 children. He quit smoking in 1997.  ROS General: Negative; No fevers, chills, or night sweats;  HEENT: Negative; No changes in vision or  hearing, sinus congestion, difficulty swallowing Pulmonary: Positive for  COPD , pulmonary nodule; and recent pneumonia. Cardiovascular: Syncope in June 2022 Status post femoropopliteal surgery 1993 positive for progressive claudication right leg leg greater than left lower extremity GI: Positive for  GERD; No nausea, vomiting, diarrhea, or abdominal pain GU: Negative; No dysuria, hematuria, or difficulty voiding Musculoskeletal: Negative; no myalgias, joint pain, or weakness Hematologic/Oncology: Negative; no easy bruising, bleeding Endocrine: Negative; no heat/cold intolerance; no diabetes Neuro: Negative; no changes in balance, headaches Skin: Negative; No rashes or skin lesions Psychiatric: Negative; No behavioral problems, depression Sleep: Negative; No snoring, daytime sleepiness, hypersomnolence, bruxism, restless legs, hypnogognic hallucinations, no cataplexy   PE BP 130/75   Pulse 66   Ht _0  (1.727 m)   Wt 158 lb 6.4 oz (71.8 kg)   SpO2 99%   BMI 24.08 kg/m    Repeat blood pressure by me was 122/70 supine and 120/72 standing  Wt Readings from Last 3 Encounters:  02/10/21 158 lb 6.4 oz (71.8 kg)  01/02/21 159 lb 3.2 oz (72.2 kg)  12/25/20 158 lb (71.7 kg)   General: Alert, oriented, no distress.  Skin: normal turgor, no rashes, warm and dry HEENT: Normocephalic, atraumatic. Pupils equal round and reactive to light; sclera anicteric; extraocular muscles intact;  Nose without nasal septal hypertrophy Mouth/Parynx benign; Mallinpatti scale 3 Neck: No JVD, no carotid bruits; normal carotid upstroke Lungs: clear to ausculatation and percussion; no wheezing or rales Chest wall: without tenderness to palpitation Heart: PMI not displaced, RRR, s1 s2 normal, 1/6 systolic murmur, no diastolic murmur, no rubs, gallops, thrills, or heaves Abdomen: soft, nontender; no hepatosplenomehaly, BS+; abdominal aorta nontender and not dilated by palpation. Back: no CVA tenderness Pulses 2+ bilateral femoral bruits Musculoskeletal: full range of motion, normal strength, no joint deformities Extremities: no clubbing cyanosis or edema, Homan's sign negative  Neurologic: grossly nonfocal; Cranial nerves grossly wnl Psychologic: Normal mood and affect   February 10, 2021 ECG  (independently read by me): NSR at 66; RBBB, 1st degree AV block  December 2021 ECG (independently read by me): NSR at 78; RBBB with repolarization  September 2021 ECG (independently read by me): Normal sinus rhythm at 66 bpm, right bundle branch block with repolarization changes.  QTc interval 450 ms.  January 2021 ECG (independently read by me): Sinus rhythm at 75 bpm, right bundle branch block with repolarization changes, borderline first-degree AV block with a PR normal at 202 ms.  QTc interval 455 ms.  December 2019 ECG (independently read by me): Normal sinus rhythm at 72 bpm.  Right bundle branch block with repolarization changes.  No ectopy.  April 2018 ECG (independently read by me): Normal sinus rhythm at 65 bpm.  Right bundle-branch block with repolarization changes.  Borderline first-degree AV block with a PR interval 22 ms.  06/23/2016 ECG (independently read by me): normal sinus rhythm at 86 bpm.  Right bundle branch block with repolarization changes.  Small Q wave inferiorly.  November 2016 ECG (independently read by me):  Normal sinus rhythm with right bundle branch block and repolarization changes. First-degree AV block with PR interval of 210 ms.  March 2016 ECG (independently read by me): Normal sinus rhythm at 64 bpm.  Right bundle branch block with repolarization changes.  First-degree AV block with a PR interval of 228 ms.  September 2015 ECG (independently read by me): Sinus rhythm with first-degree AV block.  PR interval 214 ms.  Right bundle branch block with repolarization changes.  January 2015 ECG (independently read by me): Sinus rhythm 86 beats per minute. PR interval 200 ms.  Prior ECG of July 2014 :Sinus rhythm at 70 beats per minute. First degree AV block. 1 isolated unifocal PVC.  LABS:  BMP Latest Ref Rng & Units 01/02/2021 01/01/2021 12/25/2020  Glucose 70 - 99 mg/dL 110(H) 101(H) 99  BUN 8 - 23 mg/dL _0 Creatinine 0.61 - 1.24 mg/dL 0.77 1.09 0.79   Sodium 135 - 145 mmol/L 138 140 140  Potassium 3.5 - 5.1 mmol/L 4.0 4.1 4.3  Chloride 98 - 111 mmol/L 108 109 112(H)  CO2 22 - 32 mmol/L _1 Calcium 8.9 - 10.3 mg/dL 8.5(L) 9.1 8.6(L)    Hepatic Function Latest Ref Rng & Units 01/01/2021 05/25/2016 04/07/2014  Total Protein 6.5 - 8.1 g/dL 6.8 6.8 7.7  Albumin 3.5 - 5.0 g/dL 3.9 3.1(L) 4.1  AST 15 - 41 U/L 22 14(L) 20  ALT 0 - 44 U/L 23 13(L) 19  Alk Phosphatase 38 - 126 U/L 64 63 89  Total Bilirubin 0.3 - 1.2 mg/dL 0.4 0.9 0.5    CBC Latest Ref Rng & Units 01/02/2021 01/01/2021 12/25/2020  WBC 4.0 - 10.5 K/uL 10.4 11.6(H) 17.0(H)  Hemoglobin 13.0 - 17.0 g/dL 13.7 15.2 14.3  Hematocrit 39.0 - 52.0 % 41.6 46.6 42.7  Platelets 150 - 400 K/uL 274 309 236    Lab Results  Component Value Date   MCV 93.3 01/02/2021   MCV 94.3 01/01/2021   MCV 93.8 12/25/2020   Lab Results  Component Value Date   TSH 1.850 04/07/2014    BNP    Component Value Date/Time   PROBNP 190.6 (H) 04/07/2014 1600    Lipid Panel     Component Value Date/Time   CHOL 96 01/31/2013 0940   TRIG 78 01/31/2013 0940   HDL 37 (L) 01/31/2013 0940   CHOLHDL 2.6 01/31/2013 0940   VLDL 16 01/31/2013 0940   LDLCALC 43 01/31/2013 0940     RADIOLOGY: No results found.  IMPRESSION:  No diagnosis found.   ASSESSMENT AND PLAN: Mr. Givan is a 75 year old gentleman who has coronary as well as peripheral vascular disease and is s/p CABG revascularization surgery in 1998 and underwent femoropopliteal bypass surgery in 1993.  A nuclear perfusion study in 2015 suggested  patency of his grafts and this was without scar or ischemia. His CAD appears stable and he is not having anginal symptoms on current therapy. His carotid ultrasound  on 04/07/2014 showed bilateral atherosclerotic plaque in the carotid bulb and internal carotid arteries with focal 60-69% stenosis in the left internal carotid and he had preocclusive narrowing in the left external carotid.  His left  vertebral artery was not visualized.  A lower extremity Doppler in 2019 revealed noncompressible arteries with abnormal toe brachial index but on the most recent study the right ABI was slightly increased from 1 year previously.  His  carotid studies in September 2020 showed 1 to 39% stenosis in the right ICA, nonhemodynamic significant plaque of less than 50% in the common carotid artery.  The left carotid had 40 to 59% ICA stenosis.  The right vertebral artery had normal antegrade flow.  The left vertebral artery was occluded.  There was stenosis noted in the left subclavian artery.  Carotid studies in September 2021 showed 1 to 39% stenosis in the right ICA and 40 to 59% in the left.  Presently, he has noticed slight additional progression of  his claudication symptoms being more pronounced in the right compared to left lower extremity.  In addition he has begun to notice some mild episodes of chest tightness with activity.  He continues to be on DAPT with aspirin/Plavix.  He admits to experiencing some numbness of his toes.  I am recommending he undergo a [place] study for further evaluation of myocardial perfusion, 24 years status post his CABG revascularization surgery.  I am also recommending he undergo a repeat lower extremity arterial Doppler study for reassessment of his ABIs and claudication symptomatology.  His blood pressure today is stable on his current regimen and he is not orthostatic on amlodipine, losartan, and metoprolol succinate.  He sees Dr. Neta Mends for his primary care who has checking laboratory in June during his hospitalization renal function was stable as well as his hemoglobin and hematocrit.  Troponins were negative.   Troy Sine, MD, [facility]  02/10/2021 8:50 AM

## 2021-02-11 ENCOUNTER — Other Ambulatory Visit: Payer: Self-pay | Admitting: Cardiovascular Disease

## 2021-02-11 ENCOUNTER — Telehealth (HOSPITAL_COMMUNITY): Payer: Self-pay | Admitting: *Deleted

## 2021-02-11 NOTE — Telephone Encounter (Signed)
Close encounter 

## 2021-02-12 ENCOUNTER — Other Ambulatory Visit: Payer: Self-pay

## 2021-02-12 ENCOUNTER — Ambulatory Visit (HOSPITAL_COMMUNITY)
Admission: RE | Admit: 2021-02-12 | Discharge: 2021-02-12 | Disposition: A | Discharge status: Home / Self Care | Payer: Medicare Other | Source: Ambulatory Visit | Attending: Cardiology | Admitting: Cardiology

## 2021-02-12 ENCOUNTER — Encounter: Payer: Self-pay | Admitting: Cardiovascular Disease

## 2021-02-12 DIAGNOSIS — R079 Chest pain, unspecified: Secondary | ICD-10-CM | POA: Diagnosis not present

## 2021-02-12 LAB — MYOCARDIAL PERFUSION IMAGING
LV dias vol: 85 mL (ref 62–150)
LV sys vol: 30 mL
Peak HR: 81 {beats}/min
Rest HR: 64 {beats}/min
SDS: 2
SRS: 2
SSS: 4
TID: 0.92

## 2021-02-12 MED ORDER — TECHNETIUM TC 99M TETROFOSMIN IV KIT
10.9000 | PACK | Freq: Once | INTRAVENOUS | Status: AC | PRN
  Administered 2021-02-12: 10.9 via INTRAVENOUS
  Filled 2021-02-12: qty 11

## 2021-02-12 MED ORDER — REGADENOSON 0.4 MG/5ML IV SOLN
0.4000 mg | Freq: Once | INTRAVENOUS | Status: AC
  Administered 2021-02-12: 0.4 mg via INTRAVENOUS

## 2021-02-12 MED ORDER — TECHNETIUM TC 99M TETROFOSMIN IV KIT
32.1000 | PACK | Freq: Once | INTRAVENOUS | Status: AC | PRN
  Administered 2021-02-12: 32.1 via INTRAVENOUS
  Filled 2021-02-12: qty 33

## 2021-02-17 ENCOUNTER — Telehealth: Payer: Self-pay | Admitting: Cardiovascular Disease

## 2021-02-17 NOTE — Telephone Encounter (Signed)
Patient made aware of results and verbalized understanding.   Troy Sine, MD  02/15/2021  3:49 PM EDT      Low risk nuclear perfusion study with EF 65%.  No ST segment changes.  Minimalapical inferior defect; doubt significant.

## 2021-02-17 NOTE — Telephone Encounter (Signed)
Pt returning call for results... please advise  

## 2021-02-18 ENCOUNTER — Ambulatory Visit (HOSPITAL_COMMUNITY)
Admission: RE | Admit: 2021-02-18 | Discharge: 2021-02-18 | Disposition: A | Discharge status: Home / Self Care | Payer: Medicare Other | Source: Ambulatory Visit | Attending: Cardiovascular Disease | Admitting: Cardiovascular Disease

## 2021-02-18 ENCOUNTER — Other Ambulatory Visit: Payer: Self-pay

## 2021-02-18 DIAGNOSIS — I739 Peripheral vascular disease, unspecified: Secondary | ICD-10-CM | POA: Insufficient documentation

## 2021-02-19 ENCOUNTER — Other Ambulatory Visit: Payer: Self-pay | Admitting: Physician Assistant

## 2021-02-25 ENCOUNTER — Telehealth: Payer: Self-pay | Admitting: Cardiovascular Disease

## 2021-02-25 NOTE — Telephone Encounter (Signed)
Patient was calling in for results. Please advise  

## 2021-02-25 NOTE — Telephone Encounter (Signed)
Pt is calling in to inquire if his lower extremity arterial US and ABI results are back. Informed the pt that these are back and we are awaiting for Dr. Claiborne Billings to further review and advise on these. Informed the pt that I will route this message to Dr. Claiborne Billings to further review and and advise on pts LE Arterial Duplex results from 8/3, and nursing will follow-p with him shortly thereafter, once received.  Pt verbalized understanding and agrees with this plan.

## 2021-03-03 ENCOUNTER — Other Ambulatory Visit: Payer: Self-pay

## 2021-03-03 DIAGNOSIS — I739 Peripheral vascular disease, unspecified: Secondary | ICD-10-CM

## 2021-03-04 ENCOUNTER — Other Ambulatory Visit: Payer: Self-pay | Admitting: Cardiovascular Disease

## 2021-03-04 NOTE — Telephone Encounter (Signed)
Rx(s) sent to pharmacy electronically.  

## 2021-03-25 ENCOUNTER — Ambulatory Visit: Payer: Medicare Other | Admitting: Vascular Surgery

## 2021-03-25 ENCOUNTER — Encounter: Payer: Self-pay | Admitting: Vascular Surgery

## 2021-03-25 ENCOUNTER — Other Ambulatory Visit: Payer: Self-pay

## 2021-03-25 VITALS — BP 121/70 | HR 73 | Temp 98.2°F | Ht 68.0 in | Wt 156.0 lb

## 2021-03-25 DIAGNOSIS — I739 Peripheral vascular disease, unspecified: Secondary | ICD-10-CM | POA: Diagnosis not present

## 2021-03-25 NOTE — Progress Notes (Signed)
[facility] [place]  Patient name: Gregory Eaton MRN: [id number] DOB: [birth year] Sex: male  REASON FOR CONSULT: Evaluation bilateral lower extremity claudication right greater than left  HPI: Gregory Eaton is a 75 y.o. male, who is here for evaluation of lower extremity arterial insufficiency.  He is here today with his wife.  He has a remote history of above-knee to below-knee prosthetic bypass by Dr. Virginia Eaton in 1993.  He apparently had a Gregory Eaton occlusion of this and was taken back for a left femoral endarterectomy and thrombectomy of his bypass.  He reports a long history of progressively severe bilateral lower extremity claudication.  He feels that his right is somewhat worse than his left.  He reports that he can walk downhill fairly well but has a great deal of difficulty walking uphill and has to stop repeatedly.  He also reports a burning sensation in his feet bilaterally which appears to be more consistent with neuropathy.  He has no history of tissue loss.  Past Medical History:  Diagnosis Date   Adenomatous colon polyp 03/2009   Last colonoscopy by Dr. [name]    Adenomatous polyp 2010   Adenomatous polyp of colon 11/03/2010   Arthritis    Barrett's esophagus    CAD (coronary artery disease)    Complication of anesthesia    has a shortened esophogus due to CA.    COPD (chronic obstructive pulmonary disease) (HCC)    Severe emphysema per CT   Diverticulosis    Esophageal carcinoma ([place]) 03/2009   T1N1M0   GERD (gastroesophageal reflux disease)    Glaucoma    History of Doppler ultrasound 11/09/2011   03/2014- 50-69% L ICA stenosis;carotid doppler; L bulb/prox ICA 0-49% diameter reduction; L vertebral artery - occlusive ds; L ECA  demonstrates severe amount of fibrous plaque   History of Doppler ultrasound 11/09/11   LEAs; R ABI - mod art. insuff.; L ABI normal at rest; R SFA - occlusive ds; L SFA - occlusive ds; patent  fem-pop graft   History of echocardiogram 08/27/2009   EF >55%   History of kidney stones 1965   History of nuclear stress test 11/24/2011   lexiscan; normal perfusion; low risk scan; non-diagnostic for ischemia   Hyperlipidemia    Hypertension    Left carotid artery stenosis 04/08/2014   Pulmonary nodule, right 04/08/2014   2.8 mm-incidental finding on CT   PVD (peripheral vascular disease) ([place])    Tachyarrhythmia 1999   Status post ablation at [facility]   Tobacco abuse     Family History  Problem Relation Age of Onset   GER disease Mother    Coronary artery disease Brother    Congenital heart disease Sister    Colon cancer Neg Hx     SOCIAL HISTORY: Social History   Socioeconomic History   Marital status: Married    Spouse name: Not on file   Number of children: 2   Years of education: Not on file   Highest education level: Not on file  Occupational History   Occupation: retired    Fish farm manager: RETIRED    Comment: truck driver  Tobacco Use   Smoking status: Former    Packs/day: 2.00    Years: 40.00    Pack years: 80.00    Types: Cigarettes    Start date: 70    Quit date: 07/20/1995    Years since quitting: 25.6   Smokeless tobacco: Never  Vaping Use  Vaping Use: Never used  Substance and Sexual Activity   Alcohol use: No    Alcohol/week: 0.0 standard drinks   Drug use: No   Sexual activity: Yes    Partners: Female    Birth control/protection: Condom    Comment: friend  Other Topics Concern   Not on file  Social History Narrative   Not on file   Social Determinants of Health   Financial Resource Strain: Not on file  Food Insecurity: Not on file  Transportation Needs: Not on file  Physical Activity: Not on file  Stress: Not on file  Social Connections: Not on file  Intimate Partner Violence: Not on file    Allergies  Allergen Reactions   Altace [Ramipril] Cough    Current Outpatient Medications  Medication Sig Dispense Refill   acetaminophen  (TYLENOL) 500 MG tablet Take 500 mg by mouth daily as needed for headache.     albuterol (PROVENTIL) (2.5 MG/3ML) 0.083% nebulizer solution Take 3 mLs (2.5 mg total) by nebulization every 6 (six) hours as needed for wheezing or shortness of breath. DX: J44.9 75 mL 6   ALPRAZolam (XANAX) 0.5 MG tablet Take 1 tablet (0.5 mg total) by mouth at bedtime. 30 tablet 0   amLODipine (NORVASC) 10 MG tablet Take 10 mg by mouth daily.     aspirin EC 81 MG tablet Take 1 tablet (81 mg total) by mouth daily.     baclofen (LIORESAL) 10 MG tablet Take 1 tablet (10 mg total) by mouth 3 (three) times daily as needed for muscle spasms. 20 each 0   brimonidine (ALPHAGAN) 0.2 % ophthalmic solution Place 1 drop into the right eye at bedtime.      clopidogrel (PLAVIX) 75 MG tablet Take 1 tablet (75 mg total) by mouth daily. TAKE 1 TABLET BY MOUTH EVERY DAY     dexlansoprazole (DEXILANT) 60 MG capsule Take 1 capsule (60 mg total) by mouth as needed (takes 2-3 times per week).     ezetimibe (ZETIA) 10 MG tablet TAKE ONE (1) TABLET BY MOUTH EVERY DAY 90 tablet 2   Glycopyrrolate-Formoterol (BEVESPI AEROSPHERE) 9-4.8 MCG/ACT AERO Inhale 2 puffs into the lungs 2 (two) times daily. 5.9 g 6   guaiFENesin (MUCINEX) 600 MG 12 hr tablet Take 1,200 mg by mouth every 12 (twelve) hours as needed (cough).      latanoprost (XALATAN) 0.005 % ophthalmic solution Place 1 drop into both eyes at bedtime.      losartan (COZAAR) 100 MG tablet TAKE ONE TABLET BY MOUTH EVERY NIGHT AT BEDTIME 90 tablet 3   Meloxicam (MOBIC PO) Take 15 mg by mouth daily.     metoprolol succinate (TOPROL-XL) 50 MG 24 hr tablet TAKE ONE (1) TABLET BY MOUTH EVERY DAY 90 tablet 0   montelukast (SINGULAIR) 10 MG tablet Take 1 tablet (10 mg total) by mouth daily. 90 tablet 4   Multiple Vitamins-Minerals (CENTRUM SILVER PO) Take 1 tablet by mouth daily.     nitroGLYCERIN (NITROSTAT) 0.4 MG SL tablet Place 1 tablet (0.4 mg total) under the tongue every 5 (five) minutes as  needed for chest pain. 25 tablet 0   polyethylene glycol-electrolytes (NULYTELY) 420 g solution As directed 4000 mL 0   rosuvastatin (CRESTOR) 10 MG tablet TAKE ONE (1) TABLET BY MOUTH EVERY DAY 90 tablet 3   No current facility-administered medications for this visit.    REVIEW OF SYSTEMS:  '[X]'$  denotes positive finding, '[ ]'$  denotes negative finding Cardiac  Comments:  Chest pain or chest pressure:    Shortness of breath upon exertion:    Short of breath when lying flat:    Irregular heart rhythm:        Vascular    Pain in calf, thigh, or hip brought on by ambulation: x   Pain in feet at night that wakes you up from your sleep:  x   Blood clot in your veins:    Leg swelling:         Pulmonary    Oxygen at home:    Productive cough:     Wheezing:         Neurologic    Sudden weakness in arms or legs:     Sudden numbness in arms or legs:     Sudden onset of difficulty speaking or slurred speech:    Temporary loss of vision in one eye:     Problems with dizziness:         Gastrointestinal    Blood in stool:     Vomited blood:         Genitourinary    Burning when urinating:     Blood in urine:        Psychiatric    Major depression:         Hematologic    Bleeding problems:    Problems with blood clotting too easily:        Skin    Rashes or ulcers:        Constitutional    Fever or chills:      PHYSICAL EXAM: Vitals:   03/25/21 1008  BP: 121/70  Pulse: 73  Temp: 98.2 F (36.8 C)  TempSrc: Temporal  SpO2: 98%  Weight: 156 lb (70.8 kg)  Height: '5\' 8"'$  (1.727 m)    GENERAL: The patient is a well-nourished male, in no acute distress. The vital signs are documented above. CARDIOVASCULAR: 2+ radial pulses bilaterally.  2+ femoral pulses bilaterally.  He actually has a palpable left popliteal pulse and no pulse on the right.  He has absent pedal pulses bilaterally PULMONARY: There is good air exchange  MUSCULOSKELETAL: There are no major deformities or  cyanosis. NEUROLOGIC: No focal weakness or paresthesias are detected. SKIN: There are no ulcers or rashes noted. PSYCHIATRIC: The patient has a normal affect.  DATA:  Recent noninvasive studies at [facility] were reviewed from 02/18/2021.  This reveals monophasic waveforms bilaterally.  His vessels are noncompressible therefore ankle Imvanex is not obtainable.  He did have a CT scan in 2019 for evaluation of abdominal discomfort.  I have reviewed these images.  He has severe calcification of his aortoiliac segments without clear flow-limiting stenosis.  He does have extreme calcification in his right common femoral artery  MEDICAL ISSUES: Had long discussion with the patient and his wife.  He does have severe claudication reports that this is very limiting to him.  He is frustrated and reports he is able to be quite active aside from his claudication.  I explained that this is not limb threatening.  I did explain the option of further evaluation with arteriography and that endovascular treatment may be an option or he may require surgical treatment.  This may be a straightforward as simple right femoral endarterectomy.  He does report that the right leg is somewhat worse than his right.  He wishes to proceed with arteriography.  Understands this will be done by one of my partners and that if  he does require surgery this will be done by one of my partners as well.  This will be scheduled at his earliest convenience at [place], [facility] Tel [phone] Pager [phone]  Note: Portions of this report may have been transcribed using voice recognition software.  Every effort has been made to ensure accuracy; however, inadvertent computerized transcription errors may still be present.

## 2021-03-26 ENCOUNTER — Other Ambulatory Visit: Payer: Self-pay

## 2021-03-30 ENCOUNTER — Ambulatory Visit: Payer: Medicare Other | Admitting: Gastroenterology

## 2021-03-30 ENCOUNTER — Other Ambulatory Visit: Payer: Self-pay

## 2021-03-30 ENCOUNTER — Encounter: Payer: Self-pay | Admitting: Gastroenterology

## 2021-03-30 VITALS — BP 149/66 | HR 81 | Temp 97.7°F | Ht 68.0 in | Wt 157.8 lb

## 2021-03-30 DIAGNOSIS — K2270 Barrett's esophagus without dysplasia: Secondary | ICD-10-CM

## 2021-03-30 DIAGNOSIS — Z86010 Personal history of colonic polyps: Secondary | ICD-10-CM

## 2021-03-30 DIAGNOSIS — Z860101 Personal history of adenomatous and serrated colon polyps: Secondary | ICD-10-CM

## 2021-03-30 NOTE — Progress Notes (Signed)
Primary Care Physician: Curly Rim, MD  Primary Gastroenterologist:  Garfield Cornea, MD   Chief Complaint  Patient presents with   history of polyps    Needs to reschedule TCS/EGD    HPI: Gregory Eaton is a 75 y.o. male with history of esophageal cancer status post esophagectomy with gastric pull-through in 2010, Barrett's esophagus newly diagnosed in 2019 with no dysplasia, history of adenomatous colon polyps returning for follow-up.  He was last seen in April.  We scheduled him for an EGD and colonoscopy but due to presyncopal episodes prompting cardiology work-up, his procedures were postponed.  He was hospitalized overnight in June following syncope.  His blood pressure was 65/34 on arrival and he was felt to be volume depleted.  His amlodipine and spironolactone were discontinued. He was in normal sinus rhythm during overnight admission. Plans to consider Holter monitor if another episode of presyncope or syncope. Amlodipine has since been restarted and titrated up without issues. Patient reports no further syncopal episodes.   He is scheduled for abdominal aortogram with lower extremity September 14. Patient states he may have stents placed.  From a GI standpoint: has mild constipation. Some straining at times. Takes miralax about 3 times per week. Will increase frequency when needed. Does not take daily as he will overshoot to diarrhea. No melena, brbpr. Since his esophagectomy with gastric pull through in 2010, he has nocturnal regurgitation. He sleeps with head of bed elevated. He waist 5-6 hours between meals and laying down. No heartburn. During the daytime he has no symptoms.   Wants small volume prep when time for colonoscopy due to inability to drink too much at one time. Insurance would not cover before so we will work towards providing a sample.    Current Outpatient Medications  Medication Sig Dispense Refill   acetaminophen (TYLENOL) 500 MG tablet Take 1,000  mg by mouth daily as needed for headache.     albuterol (PROVENTIL) (2.5 MG/3ML) 0.083% nebulizer solution Take 3 mLs (2.5 mg total) by nebulization every 6 (six) hours as needed for wheezing or shortness of breath. DX: J44.9 75 mL 6   ALPRAZolam (XANAX) 0.5 MG tablet Take 1 tablet (0.5 mg total) by mouth at bedtime. 30 tablet 0   amLODipine (NORVASC) 10 MG tablet Take 10 mg by mouth daily.     aspirin EC 81 MG tablet Take 1 tablet (81 mg total) by mouth daily.     baclofen (LIORESAL) 10 MG tablet Take 1 tablet (10 mg total) by mouth 3 (three) times daily as needed for muscle spasms. 20 each 0   clopidogrel (PLAVIX) 75 MG tablet Take 1 tablet (75 mg total) by mouth daily. TAKE 1 TABLET BY MOUTH EVERY DAY     dexlansoprazole (DEXILANT) 60 MG capsule Take 1 capsule (60 mg total) by mouth as needed (takes 2-3 times per week).     Glycopyrrolate-Formoterol (BEVESPI AEROSPHERE) 9-4.8 MCG/ACT AERO Inhale 2 puffs into the lungs 2 (two) times daily. (Patient taking differently: Inhale 2 puffs into the lungs 2 (two) times daily as needed (shortness of breath).) 5.9 g 6   guaiFENesin (MUCINEX) 600 MG 12 hr tablet Take 600 mg by mouth every 12 (twelve) hours as needed (cough).     latanoprost (XALATAN) 0.005 % ophthalmic solution Place 1 drop into both eyes at bedtime.      losartan (COZAAR) 100 MG tablet TAKE ONE TABLET BY MOUTH EVERY NIGHT AT BEDTIME 90 tablet 3  metoprolol succinate (TOPROL-XL) 50 MG 24 hr tablet TAKE ONE (1) TABLET BY MOUTH EVERY DAY 90 tablet 0   montelukast (SINGULAIR) 10 MG tablet Take 1 tablet (10 mg total) by mouth daily. 90 tablet 4   Multiple Vitamins-Minerals (CENTRUM SILVER PO) Take 1 tablet by mouth daily.     nitroGLYCERIN (NITROSTAT) 0.4 MG SL tablet Place 1 tablet (0.4 mg total) under the tongue every 5 (five) minutes as needed for chest pain. 25 tablet 0   rosuvastatin (CRESTOR) 10 MG tablet TAKE ONE (1) TABLET BY MOUTH EVERY DAY 90 tablet 3   No current  facility-administered medications for this visit.    Allergies as of 03/30/2021 - Review Complete 03/30/2021  Allergen Reaction Noted   Altace [ramipril] Cough 04/07/2014   Past Medical History:  Diagnosis Date   Adenomatous colon polyp 03/2009   Last colonoscopy by Dr. [name]    Adenomatous polyp 2010   Adenomatous polyp of colon 11/03/2010   Arthritis    Barrett's esophagus    CAD (coronary artery disease)    Complication of anesthesia    has a shortened esophogus due to CA.    COPD (chronic obstructive pulmonary disease) (HCC)    Severe emphysema per CT   Diverticulosis    Esophageal carcinoma ([place]) 03/2009   T1N1M0   GERD (gastroesophageal reflux disease)    Glaucoma    History of Doppler ultrasound 11/09/2011   03/2014- 50-69% L ICA stenosis;carotid doppler; L bulb/prox ICA 0-49% diameter reduction; L vertebral artery - occlusive ds; L ECA  demonstrates severe amount of fibrous plaque   History of Doppler ultrasound 11/09/11   LEAs; R ABI - mod art. insuff.; L ABI normal at rest; R SFA - occlusive ds; L SFA - occlusive ds; patent fem-pop graft   History of echocardiogram 08/27/2009   EF >55%   History of kidney stones 1965   History of nuclear stress test 11/24/2011   lexiscan; normal perfusion; low risk scan; non-diagnostic for ischemia   Hyperlipidemia    Hypertension    Left carotid artery stenosis 04/08/2014   Pulmonary nodule, right 04/08/2014   2.8 mm-incidental finding on CT   PVD (peripheral vascular disease) ([place])    Tachyarrhythmia 1999   Status post ablation at [facility]   Tobacco abuse    Past Medical History:  Diagnosis Date   Adenomatous colon polyp 03/2009   Last colonoscopy by Dr. [name]    Adenomatous polyp 2010   Adenomatous polyp of colon 11/03/2010   Arthritis    Barrett's esophagus    CAD (coronary artery disease)    Complication of anesthesia    has a shortened esophogus due to CA.    COPD (chronic obstructive pulmonary disease) (HCC)    Severe emphysema  per CT   Diverticulosis    Esophageal carcinoma ([place]) 03/2009   T1N1M0   GERD (gastroesophageal reflux disease)    Glaucoma    History of Doppler ultrasound 11/09/2011   03/2014- 50-69% L ICA stenosis;carotid doppler; L bulb/prox ICA 0-49% diameter reduction; L vertebral artery - occlusive ds; L ECA  demonstrates severe amount of fibrous plaque   History of Doppler ultrasound 11/09/11   LEAs; R ABI - mod art. insuff.; L ABI normal at rest; R SFA - occlusive ds; L SFA - occlusive ds; patent fem-pop graft   History of echocardiogram 08/27/2009   EF >55%   History of kidney stones 1965   History of nuclear stress test 11/24/2011   lexiscan; normal  perfusion; low risk scan; non-diagnostic for ischemia   Hyperlipidemia    Hypertension    Left carotid artery stenosis 04/08/2014   Pulmonary nodule, right 04/08/2014   2.8 mm-incidental finding on CT   PVD (peripheral vascular disease) ([place])    Tachyarrhythmia 1999   Status post ablation at [facility]   Tobacco abuse      ROS:  General: Negative for anorexia, weight loss, fever, chills, fatigue, weakness. ENT: Negative for hoarseness, difficulty swallowing , nasal congestion. CV: Negative for chest pain, angina, palpitations, dyspnea on exertion, peripheral edema.  Respiratory: Negative for dyspnea at rest, dyspnea on exertion, cough, sputum, wheezing.  GI: See history of present illness. GU:  Negative for dysuria, hematuria, urinary incontinence, urinary frequency, nocturnal urination.  Endo: Negative for unusual weight change.    Physical Examination:   BP (!) 149/66   Pulse 81   Temp 97.7 F (36.5 C) (Temporal)   Ht '5\' 8"'$  (1.727 m)   Wt 157 lb 12.8 oz (71.6 kg)   BMI 23.99 kg/m   General: Well-nourished, well-developed in no acute distress.  Eyes: No icterus. Mouth: masked. Lungs: Clear to auscultation bilaterally.  Heart: Regular rate and rhythm, no murmurs rubs or gallops.  Abdomen: Bowel sounds are normal, nontender, nondistended,  no hepatosplenomegaly or masses, no abdominal bruits or hernia , no rebound or guarding.   Extremities: No lower extremity edema. No clubbing or deformities. Neuro: Alert and oriented x 4   Skin: Warm and dry, no jaundice.   Psych: Alert and cooperative, normal mood and affect.  Labs:  Lab Results  Component Value Date   CREATININE 0.77 01/02/2021   BUN 12 01/02/2021   NA 138 01/02/2021   K 4.0 01/02/2021   CL 108 01/02/2021   CO2 23 01/02/2021   Lab Results  Component Value Date   WBC 10.4 01/02/2021   HGB 13.7 01/02/2021   HCT 41.6 01/02/2021   MCV 93.3 01/02/2021   PLT 274 01/02/2021   Lab Results  Component Value Date   ALT 23 01/01/2021   AST 22 01/01/2021   ALKPHOS 64 01/01/2021   BILITOT 0.4 01/01/2021   Lab Results  Component Value Date   LIPASE 23 03/15/2014     Imaging Studies: No results found.   Assessment:  Pleasant 75 y/o male with history of esophageal cancer s/p esophagectomy with gastric pull-through in 2010, Barrett's esophageal diagnosed in 2019 with no dysplasia, h/o adenomatous colon polyps returning to schedule EGD/colonoscopy. Procedures cancelled earlier this year due to hospitalization for syncope (felt to be due to volume depletion). Has been evaluated by cardiology with no additional work up at this time planned. If another episode of syncope, Holter monitor planned.   Barrett's esophagus: h/o remote esophageal cancer in 2010, EGD in 2019 with new Barrett's. Due for surveillance. Patient takes his Dexilant 2-3 times per week.   GERD: No heartburn. He has nocturnal regurgitation since his surgery in 2010. Head of bed elevated. Waits 5-6 hours between eating and laying down. No daytime symptoms.   H/O adenomatous colon polyps: due 3 year surveillance exam.  Constipation: use miralax one capful daily as needed.    Plan: Await findings of aortogram planned for Wednesday. If stenting performed, will need to decide when we can proceed with  EGD/colonoscopy. Typically we don't need to hold Plavix for procedure.  Continue Miralax one capful daily as needed to maintain regular BMs. Continue Dexilant '60mg'$  daily as needed.

## 2021-03-30 NOTE — Patient Instructions (Signed)
We will touch base with you later this week and let you know when Dr. [name] recommends colonoscopy and upper endoscopy based on if you receive a stent at time of Wednesday's procedure.

## 2021-03-31 ENCOUNTER — Other Ambulatory Visit (HOSPITAL_COMMUNITY): Payer: Self-pay | Admitting: Cardiovascular Disease

## 2021-03-31 DIAGNOSIS — I6523 Occlusion and stenosis of bilateral carotid arteries: Secondary | ICD-10-CM

## 2021-04-01 ENCOUNTER — Ambulatory Visit (HOSPITAL_COMMUNITY)
Admission: RE | Disposition: A | Discharge status: Home / Self Care | Payer: Self-pay | Source: Home / Self Care | Attending: Vascular Surgery

## 2021-04-01 ENCOUNTER — Ambulatory Visit (HOSPITAL_COMMUNITY)
Admission: RE | Admit: 2021-04-01 | Discharge: 2021-04-01 | Disposition: A | Discharge status: Home / Self Care | Payer: Medicare Other | Attending: Vascular Surgery | Admitting: Vascular Surgery

## 2021-04-01 ENCOUNTER — Other Ambulatory Visit: Payer: Self-pay

## 2021-04-01 DIAGNOSIS — Z888 Allergy status to other drugs, medicaments and biological substances status: Secondary | ICD-10-CM | POA: Insufficient documentation

## 2021-04-01 DIAGNOSIS — I708 Atherosclerosis of other arteries: Secondary | ICD-10-CM | POA: Diagnosis not present

## 2021-04-01 DIAGNOSIS — I70213 Atherosclerosis of native arteries of extremities with intermittent claudication, bilateral legs: Secondary | ICD-10-CM | POA: Diagnosis present

## 2021-04-01 DIAGNOSIS — Z7902 Long term (current) use of antithrombotics/antiplatelets: Secondary | ICD-10-CM | POA: Diagnosis not present

## 2021-04-01 DIAGNOSIS — Z87891 Personal history of nicotine dependence: Secondary | ICD-10-CM | POA: Insufficient documentation

## 2021-04-01 DIAGNOSIS — Z79899 Other long term (current) drug therapy: Secondary | ICD-10-CM | POA: Diagnosis not present

## 2021-04-01 DIAGNOSIS — Z7982 Long term (current) use of aspirin: Secondary | ICD-10-CM | POA: Insufficient documentation

## 2021-04-01 HISTORY — PX: ABDOMINAL AORTOGRAM W/LOWER EXTREMITY: CATH118223

## 2021-04-01 HISTORY — PX: PERIPHERAL VASCULAR INTERVENTION: CATH118257

## 2021-04-01 LAB — POCT I-STAT, CHEM 8
BUN: 16 mg/dL (ref 8–23)
BUN: 23 mg/dL (ref 8–23)
Calcium, Ion: 1.21 mmol/L (ref 1.15–1.40)
Calcium, Ion: 1.28 mmol/L (ref 1.15–1.40)
Chloride: 106 mmol/L (ref 98–111)
Chloride: 107 mmol/L (ref 98–111)
Creatinine, Ser: 0.8 mg/dL (ref 0.61–1.24)
Creatinine, Ser: 0.8 mg/dL (ref 0.61–1.24)
Glucose, Bld: 102 mg/dL — ABNORMAL HIGH (ref 70–99)
Glucose, Bld: 108 mg/dL — ABNORMAL HIGH (ref 70–99)
HCT: 43 % (ref 39.0–52.0)
HCT: 44 % (ref 39.0–52.0)
Hemoglobin: 14.6 g/dL (ref 13.0–17.0)
Hemoglobin: 15 g/dL (ref 13.0–17.0)
Potassium: 5.2 mmol/L — ABNORMAL HIGH (ref 3.5–5.1)
Potassium: 6.2 mmol/L — ABNORMAL HIGH (ref 3.5–5.1)
Sodium: 143 mmol/L (ref 135–145)
Sodium: 144 mmol/L (ref 135–145)
TCO2: 26 mmol/L (ref 22–32)
TCO2: 29 mmol/L (ref 22–32)

## 2021-04-01 LAB — POCT ACTIVATED CLOTTING TIME
Activated Clotting Time: 248 seconds
Activated Clotting Time: 179 seconds

## 2021-04-01 SURGERY — ABDOMINAL AORTOGRAM W/LOWER EXTREMITY
Anesthesia: LOCAL

## 2021-04-01 MED ORDER — HEPARIN SODIUM (PORCINE) 1000 UNIT/ML IJ SOLN
INTRAMUSCULAR | Status: AC
  Filled 2021-04-01: qty 1

## 2021-04-01 MED ORDER — HYDRALAZINE HCL 20 MG/ML IJ SOLN: 5.0000 mg | INTRAMUSCULAR | Status: DC | PRN

## 2021-04-01 MED ORDER — MIDAZOLAM HCL 2 MG/2ML IJ SOLN
INTRAMUSCULAR | Status: AC
  Filled 2021-04-01: qty 2

## 2021-04-01 MED ORDER — ASPIRIN EC 81 MG PO TBEC: 81.0000 mg | DELAYED_RELEASE_TABLET | Freq: Every day | ORAL | Status: DC

## 2021-04-01 MED ORDER — IODIXANOL 320 MG/ML IV SOLN
INTRAVENOUS | Status: DC | PRN
  Administered 2021-04-01: 125 mL via INTRA_ARTERIAL

## 2021-04-01 MED ORDER — SODIUM CHLORIDE 0.9 % WEIGHT BASED INFUSION: 1.0000 mL/kg/h | INTRAVENOUS | Status: DC

## 2021-04-01 MED ORDER — FENTANYL CITRATE (PF) 100 MCG/2ML IJ SOLN
INTRAMUSCULAR | Status: AC
  Filled 2021-04-01: qty 2

## 2021-04-01 MED ORDER — SODIUM CHLORIDE 0.9 % IV SOLN: INTRAVENOUS | Status: DC

## 2021-04-01 MED ORDER — MIDAZOLAM HCL 2 MG/2ML IJ SOLN
INTRAMUSCULAR | Status: DC | PRN
  Administered 2021-04-01: 1 mg via INTRAVENOUS

## 2021-04-01 MED ORDER — SODIUM CHLORIDE 0.9% FLUSH: 3.0000 mL | Freq: Two times a day (BID) | INTRAVENOUS | Status: DC

## 2021-04-01 MED ORDER — LABETALOL HCL 5 MG/ML IV SOLN: 10.0000 mg | INTRAVENOUS | Status: DC | PRN

## 2021-04-01 MED ORDER — CLOPIDOGREL BISULFATE 75 MG PO TABS: 75.0000 mg | ORAL_TABLET | Freq: Every day | ORAL | Status: DC

## 2021-04-01 MED ORDER — ONDANSETRON HCL 4 MG/2ML IJ SOLN: 4.0000 mg | Freq: Four times a day (QID) | INTRAMUSCULAR | Status: DC | PRN

## 2021-04-01 MED ORDER — HEPARIN (PORCINE) IN NACL 1000-0.9 UT/500ML-% IV SOLN
INTRAVENOUS | Status: AC
  Filled 2021-04-01: qty 1000

## 2021-04-01 MED ORDER — HEPARIN (PORCINE) IN NACL 1000-0.9 UT/500ML-% IV SOLN
INTRAVENOUS | Status: DC | PRN
  Administered 2021-04-01 (×2): 500 mL

## 2021-04-01 MED ORDER — ATORVASTATIN CALCIUM 40 MG PO TABS: 40.0000 mg | ORAL_TABLET | Freq: Every day | ORAL | Status: DC

## 2021-04-01 MED ORDER — FENTANYL CITRATE (PF) 100 MCG/2ML IJ SOLN
INTRAMUSCULAR | Status: DC | PRN
  Administered 2021-04-01: 50 ug via INTRAVENOUS

## 2021-04-01 MED ORDER — SODIUM CHLORIDE 0.9 % IV SOLN: 250.0000 mL | INTRAVENOUS | Status: DC | PRN

## 2021-04-01 MED ORDER — LIDOCAINE HCL (PF) 1 % IJ SOLN
INTRAMUSCULAR | Status: AC
  Filled 2021-04-01: qty 30

## 2021-04-01 MED ORDER — HEPARIN SODIUM (PORCINE) 1000 UNIT/ML IJ SOLN
INTRAMUSCULAR | Status: DC | PRN
  Administered 2021-04-01: 7000 [IU] via INTRAVENOUS

## 2021-04-01 MED ORDER — SODIUM CHLORIDE 0.9% FLUSH: 3.0000 mL | INTRAVENOUS | Status: DC | PRN

## 2021-04-01 MED ORDER — ACETAMINOPHEN 325 MG PO TABS: 650.0000 mg | ORAL_TABLET | ORAL | Status: DC | PRN

## 2021-04-01 SURGICAL SUPPLY — 17 items
BALLN MUSTANG 7X80X75 (BALLOONS) ×3
BALLOON MUSTANG 7X80X75 (BALLOONS) ×1 IMPLANT
CATH ANGIO 5F PIGTAIL 65CM (CATHETERS) ×1 IMPLANT
CATH CROSS OVER TEMPO 5F (CATHETERS) ×2 IMPLANT
DEVICE CONTINUOUS FLUSH (MISCELLANEOUS) ×2 IMPLANT
GLIDEWIRE ADV .035X260CM (WIRE) ×1 IMPLANT
KIT ENCORE 26 ADVANTAGE (KITS) ×1 IMPLANT
KIT MICROPUNCTURE NIT STIFF (SHEATH) ×2 IMPLANT
KIT PV (KITS) ×3 IMPLANT
SHEATH BRITE TIP 6FR 35CM (SHEATH) ×1 IMPLANT
SHEATH PINNACLE 5F 10CM (SHEATH) ×1 IMPLANT
SHEATH PROBE COVER 6X72 (BAG) ×2 IMPLANT
STENT INNOVA 8X80X130 (Permanent Stent) ×2 IMPLANT
SYR MEDRAD MARK 7 150ML (SYRINGE) ×3 IMPLANT
TRANSDUCER W/STOPCOCK (MISCELLANEOUS) ×3 IMPLANT
TRAY PV CATH (CUSTOM PROCEDURE TRAY) ×3 IMPLANT
WIRE STARTER BENTSON 035X150 (WIRE) ×1 IMPLANT

## 2021-04-01 NOTE — Op Note (Addendum)
Patient name: Gregory Eaton MRN: [id number] DOB: 28-Jun-1946 Sex: male  04/01/2021 Pre-operative Diagnosis: Bilateral lower extremity left-sided claudication Post-operative diagnosis:  Same Surgeon:  [name] Procedure Performed: 1.  Ultrasound-guided micropuncture access of the left common femoral artery 2.  Aortogram 3.  Bilateral lower extremity angiogram 4.  8 x 80 left external iliac self-expanding stent placement 5.  7 x 80 left external iliac wound angioplasty for stent expansion 6.  Selective access angiogram of the left common femoral artery 7.  40 minutes moderate sedation   Indications: Patient is a 75 year old male with a 40-year history of peripheral arterial disease, status post left femoral to below-knee popliteal artery bypass with vein who presented to clinic with bilateral lower extremity lifestyle limiting claudication left greater than right.  Findings:  Bilateral patent renal arteries, right sides with accessory renal. Severely calcified aorta with bilateral iliac artery tandem stenoses extending through the external iliac arteries. Internal iliac arteries occluded. Pelvis filling through lumbar and profunda collaterals. There was a stent appreciated in the right external iliac artery that was widely patent.    In the right leg: The right common femoral artery demonstrated focal occlusion with profunda only runoff.  This was chronic with profuse collaterals. Distally, there was reconstitution at the above-knee popliteal artery through large profunda collaterals.  There appeared to be two-vessel runoff to the foot which was a dominant posterior tibial artery and peroneal artery.  In the left leg: The right common femoral artery was patent with patent femoral to below-knee popliteal artery bypass.  There was a critical stenosis at the distal aspect of the bypass.  Two-vessel runoff to the foot being anterior tibial and peroneal arteries.    Procedure:  The  patient was identified in the holding area and taken to room 8.  The patient was then placed supine on the table and prepped and draped in the usual sterile fashion.  A time out was called.  Ultrasound was used to evaluate the left common femoral artery.  It was patent .  A digital ultrasound image was acquired.  A micropuncture needle was used to access the left common femoral artery under ultrasound guidance.  An 018 wire was advanced without resistance and a micropuncture sheath was placed.  The 018 wire was removed and a GWA wire was placed.  The micropuncture sheath was exchanged for a 5 french sheath.  An omniflush catheter was advanced over the wire to the level of L-1.  An abdominal angiogram was obtained.  Next, using the omniflush catheter what brought to the terminal aorta for bilateral lower extremity angiogram. Runoff was obtained bilaterally.   Findings are outlined above.    Intervention: Due to the focal occlusion in the right common femoral artery.  The decision was made not to pursue iliac stenting of the right side as patient will be best served with common femoral artery endarterectomy with possible retrograde stenting.  From the left common femoral artery access, the patient was heparinized with 7000 units of IV heparin and the 5 [place] sheath exchanged for a 6 [place] Brite tip.  An 8 x 51m self-expanding stent was brought into the field and deployed in the external iliac artery.  A 7 x 80 mm balloon was then used for stent expansion.  Completion angiogram demonstrated excellent result with resolution of the previously illustrated left external iliac artery stenosis.  The patient will require right common femoral endarterectomy to relieve his right lower extremity lifestyle and  claudication.  At that time, I will use the contralateral access to angioplasty the distal portion of his previously placed right-sided bypass as there is critical stenosis.  I will work with [name] to find a  time in the coming days.     Cassandria Santee, MD [facility] [place] Office: [phone]

## 2021-04-01 NOTE — Progress Notes (Signed)
Site area: left groin fa sheath Site Prior to Removal:  Level 0 Pressure Applied For: 20 minutes Manual:   yes Patient Status During Pull:  stable Post Pull Site:  Level 0 Post Pull Instructions Given:  yes Post Pull Pulses Present: left dp dopplered Dressing Applied:  gauze and tegaderm Bedrest begins @ 1130 Comments:

## 2021-04-01 NOTE — H&P (Signed)
Hospital H&P  MRN #:  [id number]  History of Present Illness: This is a 75 y.o. male with a 40-year history of peripheral arterial disease left femoral to above-knee popliteal artery bypass in 1993, who presents today with severe lifestyle limiting claudication affecting him after only 1 block.  Per [name], he is unable to live an active lifestyle and has become nearly sedentary due to the bilateral lower extremity pain. Gregory Eaton denies wrist pain, wounds on his feet.  He is a non-smoker since 77.  Previous duplex ultrasonography demonstrates monophasic signals throughout the bilateral lower extremities. Medications include aspirin, Plavix, statin.  Past Medical History:  Diagnosis Date   Adenomatous colon polyp 03/2009   Last colonoscopy by Dr. [name]    Adenomatous polyp 2010   Adenomatous polyp of colon 11/03/2010   Arthritis    Barrett's esophagus    CAD (coronary artery disease)    Complication of anesthesia    has a shortened esophogus due to CA.    COPD (chronic obstructive pulmonary disease) (HCC)    Severe emphysema per CT   Diverticulosis    Esophageal carcinoma ([place]) 03/2009   T1N1M0   GERD (gastroesophageal reflux disease)    Glaucoma    History of Doppler ultrasound 11/09/2011   03/2014- 50-69% L ICA stenosis;carotid doppler; L bulb/prox ICA 0-49% diameter reduction; L vertebral artery - occlusive ds; L ECA  demonstrates severe amount of fibrous plaque   History of Doppler ultrasound 11/09/11   LEAs; R ABI - mod art. insuff.; L ABI normal at rest; R SFA - occlusive ds; L SFA - occlusive ds; patent fem-pop graft   History of echocardiogram 08/27/2009   EF >55%   History of kidney stones 1965   History of nuclear stress test 11/24/2011   lexiscan; normal perfusion; low risk scan; non-diagnostic for ischemia   Hyperlipidemia    Hypertension    Left carotid artery stenosis 04/08/2014   Pulmonary nodule, right 04/08/2014   2.8 mm-incidental finding on CT   PVD (peripheral  vascular disease) ([place])    Tachyarrhythmia 1999   Status post ablation at [facility]   Tobacco abuse     Past Surgical History:  Procedure Laterality Date   BIOPSY  10/24/2017   Procedure: BIOPSY;  Surgeon: [name], MD;  Location: [facility];  Service: Endoscopy;;  esophagus   COLONOSCOPY  03/17/2009   Dr.Rourk- normal rectum, sigmoid diverticula, some pale sigmoid mucosa with diffuse petechiae. pedunculated polyp at the splenic flexure, remainder of colonic mucosa appeared normal. bx= adenomatous polyp   COLONOSCOPY  04/19/2003   Dr.Rehman- few diverticu;a at the sigmoid colon, 3 small polyps, one at the transverse colon and 2 at the sigmoid, small external hemorrhoids. bx report not available.    COLONOSCOPY WITH PROPOFOL N/A 10/24/2017   Dr. [name]: Diverticulosis, 11 mm polyp at the ileocecal valve, tubular adenoma.  Repeat colonoscopy in 3 years   CORONARY ARTERY BYPASS GRAFT  1998   [name]   ESOPHAGECTOMY  2010   [facility] Dr. [name]   ESOPHAGOGASTRODUODENOSCOPY  11/25/2010   Dr.Rourk- s/p esophagectomy with gastric pull-up, esophageal erosions straddling the surgical anastomosis, salmon colored epithelium coming up a good centimeter to a centimeter and a half above the suture line, islands of salmon colored epithelium in the most poximal residual esophagus, remainder of gastric mucosa appeared normal. bx= swamous &gastric glandular mucosa w/chronic active inflammation   ESOPHAGOGASTRODUODENOSCOPY  03/17/2009   Dr.Rourk- 4cm segment of salmon-colored epithlium distal esophagus suspicious for  barretts esophagus. area of suspicious nodularity w/in this segment bx seperately. small to moderate size hiatal hernia, o/w normal stomach D1 and D2 bx=adenocarcinoma   ESOPHAGOGASTRODUODENOSCOPY (EGD) WITH PROPOFOL N/A 10/24/2017   Dr. [name]: Remnant of esophagus with anastomosis with stomach at 24 cm from the incisors, 2 cm above the anastomosis he was found to have Barrett's but no dysplasia.   Advised for repeat EGD in 3 years   FEMORAL-POPLITEAL BYPASS GRAFT  1993   occlusive ds in R SFA   GASTRIC PULL THROUGH  2010   With esophagectomy   LIPOMA EXCISION  2010   POLYPECTOMY  10/24/2017   Procedure: POLYPECTOMY;  Surgeon: [name], MD;  Location: [facility];  Service: Endoscopy;;  colon    TOTAL HIP ARTHROPLASTY Left 01/30/2019   Procedure: TOTAL HIP ARTHROPLASTY ANTERIOR APPROACH;  Surgeon: Renette Butters, MD;  Location: [facility];  Service: Orthopedics;  Laterality: Left;    Allergies  Allergen Reactions   Altace [Ramipril] Cough    Prior to Admission medications   Medication Sig Start Date End Date Taking? Authorizing Provider  acetaminophen (TYLENOL) 500 MG tablet Take 1,000 mg by mouth daily as needed for headache.   Yes [provider]  albuterol (PROVENTIL) (2.5 MG/3ML) 0.083% nebulizer solution Take 3 mLs (2.5 mg total) by nebulization every 6 (six) hours as needed for wheezing or shortness of breath. DX: J44.9 11/05/20  Yes Margaretha Seeds, MD  ALPRAZolam Duanne Moron) 0.5 MG tablet Take 1 tablet (0.5 mg total) by mouth at bedtime. 10/07/14  Yes Troy Sine, MD  amLODipine (NORVASC) 10 MG tablet Take 10 mg by mouth daily.   Yes [provider]  aspirin EC 81 MG tablet Take 1 tablet (81 mg total) by mouth daily. 01/02/21  Yes Jennye Boroughs, MD  baclofen (LIORESAL) 10 MG tablet Take 1 tablet (10 mg total) by mouth 3 (three) times daily as needed for muscle spasms. 01/30/19  Yes Prudencio Burly III, PA-C  clopidogrel (PLAVIX) 75 MG tablet Take 1 tablet (75 mg total) by mouth daily. TAKE 1 TABLET BY MOUTH EVERY DAY 01/02/21  Yes Jennye Boroughs, MD  dexlansoprazole (DEXILANT) 60 MG capsule Take 1 capsule (60 mg total) by mouth as needed (takes 2-3 times per week). 01/02/21  Yes Jennye Boroughs, MD  Glycopyrrolate-Formoterol (BEVESPI AEROSPHERE) 9-4.8 MCG/ACT AERO Inhale 2 puffs into the lungs 2 (two) times daily. Patient taking differently:  Inhale 2 puffs into the lungs 2 (two) times daily as needed (shortness of breath). 11/05/20  Yes Margaretha Seeds, MD  guaiFENesin (MUCINEX) 600 MG 12 hr tablet Take 600 mg by mouth every 12 (twelve) hours as needed (cough).   Yes [provider]  latanoprost (XALATAN) 0.005 % ophthalmic solution Place 1 drop into both eyes at bedtime.  01/10/13  Yes [provider]  losartan (COZAAR) 100 MG tablet TAKE ONE TABLET BY MOUTH EVERY NIGHT AT BEDTIME 02/11/21  Yes Troy Sine, MD  metoprolol succinate (TOPROL-XL) 50 MG 24 hr tablet TAKE ONE (1) TABLET BY MOUTH EVERY DAY 01/27/21  Yes Troy Sine, MD  montelukast (SINGULAIR) 10 MG tablet Take 1 tablet (10 mg total) by mouth daily. 11/05/20  Yes Margaretha Seeds, MD  Multiple Vitamins-Minerals (CENTRUM SILVER PO) Take 1 tablet by mouth daily.   Yes [provider]  nitroGLYCERIN (NITROSTAT) 0.4 MG SL tablet Place 1 tablet (0.4 mg total) under the tongue every 5 (five) minutes as needed for chest pain. 02/10/21  Yes Troy Sine, MD  rosuvastatin (CRESTOR) 10 MG tablet TAKE ONE (1) TABLET BY MOUTH EVERY DAY 02/19/21  Yes [name], PA    Social History   Socioeconomic History   Marital status: Married    Spouse name: Not on file   Number of children: 2   Years of education: Not on file   Highest education level: Not on file  Occupational History   Occupation: retired    Fish farm manager: RETIRED    Comment: truck driver  Tobacco Use   Smoking status: Former    Packs/day: 2.00    Years: 40.00    Pack years: 80.00    Types: Cigarettes    Start date: 1957    Quit date: 07/20/1995    Years since quitting: 25.7   Smokeless tobacco: Never  Vaping Use   Vaping Use: Never used  Substance and Sexual Activity   Alcohol use: No    Alcohol/week: 0.0 standard drinks   Drug use: No   Sexual activity: Yes    Partners: Female    Birth control/protection: Condom    Comment: friend  Other Topics Concern   Not on file  Social  History Narrative   Not on file   Social Determinants of Health   Financial Resource Strain: Not on file  Food Insecurity: Not on file  Transportation Needs: Not on file  Physical Activity: Not on file  Stress: Not on file  Social Connections: Not on file  Intimate Partner Violence: Not on file     Family History  Problem Relation Age of Onset   GER disease Mother    Coronary artery disease Brother    Congenital heart disease Sister    Colon cancer Neg Hx     ROS: Otherwise negative unless mentioned in HPI  Physical Examination  Vitals:   04/01/21 0600  BP: (!) 142/64  Pulse: 64  Temp: 97.7 F (36.5 C)  SpO2: 98%   Body mass index is 23.72 kg/m.  General:  WDWN in NAD Gait: Not observed HENT: WNL, normocephalic Pulmonary: normal non-labored breathing, without Rales, rhonchi,  wheezing Cardiac: regular, without  Murmurs, rubs or gallops; Abdomen:  soft, NT/ND, no masses Skin: without rashes Vascular Exam/Pulses:  1+ femoral bilaterally No pulses in the feet, no wounds Extremities: without ischemic changes, without Gangrene , without cellulitis; without open wounds;  Musculoskeletal: no muscle wasting or atrophy  Neurologic: A&O X 3;  No focal weakness or paresthesias are detected; speech is fluent/normal Psychiatric:  The pt has Normal affect. Lymph:  Unremarkable  CBC    Component Value Date/Time   WBC 10.4 01/02/2021 0609   RBC 4.46 01/02/2021 0609   HGB 14.6 04/01/2021 0718   HCT 43.0 04/01/2021 0718   PLT 274 01/02/2021 0609   MCV 93.3 01/02/2021 0609   MCH 30.7 01/02/2021 0609   MCHC 32.9 01/02/2021 0609   RDW 13.0 01/02/2021 0609   LYMPHSABS 1.2 01/01/2021 1405   MONOABS 0.9 01/01/2021 1405   EOSABS 0.1 01/01/2021 1405   BASOSABS 0.1 01/01/2021 1405    BMET    Component Value Date/Time   NA 144 04/01/2021 0718   K 5.2 (H) 04/01/2021 0718   CL 107 04/01/2021 0718   CO2 23 01/02/2021 0609   GLUCOSE 108 (H) 04/01/2021 0718   BUN 16  04/01/2021 0718   CREATININE 0.80 04/01/2021 0718   CREATININE 0.79 01/31/2013 0940   CALCIUM 8.5 (L) 01/02/2021 0609   GFRNONAA >60 01/02/2021 [id number]  GFRAA >60 01/22/2019 1430    COAGS: Lab Results  Component Value Date   INR 1.0 12/25/2020     Non-Invasive Vascular Imaging:   Monophasic waveforms throughout the bilateral lower extremities  Statin:  Yes.   Beta Blocker:  Yes.   Aspirin:  Yes.   ACEI:  No. ARB:  No. CCB use:  No Other antiplatelets/anticoagulants:  Yes.   Plavix   ASSESSMENT/PLAN: This is a 75 y.o. male presenting to Cath Lab today with lifestyle limiting claudication that has become debilitating.  Patient is scheduled for aortogram, bilateral lower extremity angiogram with emphasis on the right side as it is more symptomatic.  After discussing the risks and benefits of angiography with possible intervention, Ethon elected to proceed.   Cassandria Santee MD [facility] [phone] 04/01/2021  7:58 AM

## 2021-04-01 NOTE — Progress Notes (Signed)
Dr. Virl Cagey talking wpatient

## 2021-04-02 ENCOUNTER — Other Ambulatory Visit: Payer: Self-pay

## 2021-04-02 ENCOUNTER — Telehealth: Payer: Self-pay

## 2021-04-02 ENCOUNTER — Encounter (HOSPITAL_COMMUNITY): Payer: Self-pay | Admitting: Vascular Surgery

## 2021-04-02 MED FILL — Lidocaine HCl Local Preservative Free (PF) Inj 1%: INTRAMUSCULAR | Qty: 30 | Status: AC

## 2021-04-02 NOTE — Telephone Encounter (Signed)
-----   Message from [name], MD sent at 04/02/2021 12:19 PM EDT ----- Regarding: RE: Please advise Hey,  Let him continue it please.  Eli ----- Message ----- From: Nicholas Lose, RN Sent: 04/02/2021  12:17 PM EDT To: [name], MD Subject: Please advise                                  Dr. Virl Cagey,   I noticed patient received a couple of iliac stents placed on yesterday. Do you want him to continue Plavix or hold for 5 days per protocol prior to femoral endart/angioplasty on 04/13/21?   Thanks,  [place]

## 2021-04-02 NOTE — Telephone Encounter (Signed)
Spoke with pt and advised him to continue taking Plavix and Aspirin prior to surgery. Pt verbalized understanding.

## 2021-04-08 NOTE — Progress Notes (Signed)
Surgical Instructions    Your procedure is scheduled on Monday September 26.  Report to [facility] Main Entrance "A" at 5:30 A.M., then check in with the Admitting office.  Call this number if you have problems the morning of surgery:  [phone]   If you have any questions prior to your surgery date call [phone]: Open Monday-Friday 8am-4pm    Remember:  Do not eat or drink anything after midnight the night before your surgery     Take these medicines the morning of surgery with A SIP OF WATER : acetaminophen (TYLENOL if needed albuterol (PROVENTIL) if needed amLODipine (NORVASC)  aspirin EC baclofen (LIORESAL) if needed clopidogrel (PLAVIX) dexlansoprazole (DEXILANT) if needed Glycopyrrolate-Formoterol (BEVESPI AEROSPHERE)  guaiFENesin (MUCINEX) if needed metoprolol succinate (TOPROL-XL montelukast (SINGULAIR)  nitroGLYCERIN (NITROSTAT) if needed rosuvastatin (CRESTOR)     As of today, STOP taking any Aleve, Naproxen, Ibuprofen, Motrin, Advil, Goody's, BC's, all herbal medications, fish oil, and all vitamins.          Do not wear jewelry or makeup Do not wear lotions, powders, perfumes/colognes, or deodorant. Do not shave 48 hours prior to surgery.  Men may shave face and neck. Do not bring valuables to the hospital. DO Not wear nail polish, gel polish, artificial nails, or any other type of covering on natural nails including finger and toenails. If patients have artificial nails, gel coating, etc. that need to be removed by a nail salon please have this removed prior to surgery or surgery may need to be canceled/delayed if the surgeon/ anesthesia feels like the patient is unable to be adequately monitored.             [place] is not responsible for any belongings or valuables.  Do NOT Smoke (Tobacco/Vaping)  24 hours prior to your procedure If you use a CPAP at night, you may bring your mask for your overnight stay.   Contacts, glasses, dentures or  bridgework may not be worn into surgery, please bring cases for these belongings   For patients admitted to the hospital, discharge time will be determined by your treatment team.   Patients discharged the day of surgery will not be allowed to drive home, and someone needs to stay with them for 24 hours.  NO VISITORS WILL BE ALLOWED IN PRE-OP WHERE PATIENTS GET READY FOR SURGERY.  ONLY 1 SUPPORT PERSON MAY BE PRESENT WHILE YOU ARE IN SURGERY.  IF YOU ARE TO BE ADMITTED, ONCE YOU ARE IN YOUR ROOM YOU WILL BE ALLOWED TWO (2) VISITORS.  Minor children may have two parents present. Special consideration for safety and communication needs will be reviewed on a case by case basis.  Special instructions:    Oral Hygiene is also important to reduce your risk of infection.  Remember - BRUSH YOUR TEETH THE MORNING OF SURGERY WITH YOUR REGULAR TOOTHPASTE   [place]- Preparing For Surgery  Before surgery, you can play an important role. Because skin is not sterile, your skin needs to be as free of germs as possible. You can reduce the number of germs on your skin by washing with CHG (chlorahexidine gluconate) Soap before surgery.  CHG is an antiseptic cleaner which kills germs and bonds with the skin to continue killing germs even after washing.     Please do not use if you have an allergy to CHG or antibacterial soaps. If your skin becomes reddened/irritated stop using the CHG.  Do not shave (including legs and underarms) for at  least 48 hours prior to first CHG shower. It is OK to shave your face.  Please follow these instructions carefully.     Shower the NIGHT BEFORE SURGERY and the MORNING OF SURGERY with CHG Soap.   If you chose to wash your hair, wash your hair first as usual with your normal shampoo. After you shampoo, rinse your hair and body thoroughly to remove the shampoo.  Then [employer] and genitals (private parts) with your normal soap and rinse thoroughly to remove soap.  After that  Use CHG Soap as you would any other liquid soap. You can apply CHG directly to the skin and wash gently with a scrungie or a clean washcloth.   Apply the CHG Soap to your body ONLY FROM THE NECK DOWN.  Do not use on open wounds or open sores. Avoid contact with your eyes, ears, mouth and genitals (private parts). Wash Face and genitals (private parts)  with your normal soap.   Wash thoroughly, paying special attention to the area where your surgery will be performed.  Thoroughly rinse your body with warm water from the neck down.  DO NOT shower/wash with your normal soap after using and rinsing off the CHG Soap.  Pat yourself dry with a CLEAN TOWEL.  Wear CLEAN PAJAMAS to bed the night before surgery  Place CLEAN SHEETS on your bed the night before your surgery  DO NOT SLEEP WITH PETS.   Day of Surgery:  Take a shower with CHG soap. Wear Clean/Comfortable clothing the morning of surgery Do not apply any deodorants/lotions.   Remember to brush your teeth WITH YOUR REGULAR TOOTHPASTE.   Please read over the following fact sheets that you were given.

## 2021-04-09 ENCOUNTER — Other Ambulatory Visit: Payer: Self-pay

## 2021-04-09 ENCOUNTER — Encounter (HOSPITAL_COMMUNITY)
Admission: RE | Admit: 2021-04-09 | Discharge: 2021-04-09 | Disposition: A | Discharge status: Home / Self Care | Payer: Medicare Other | Source: Ambulatory Visit | Attending: Vascular Surgery | Admitting: Vascular Surgery

## 2021-04-09 ENCOUNTER — Encounter (HOSPITAL_COMMUNITY): Payer: Self-pay

## 2021-04-09 DIAGNOSIS — Z79899 Other long term (current) drug therapy: Secondary | ICD-10-CM | POA: Diagnosis not present

## 2021-04-09 DIAGNOSIS — Z01812 Encounter for preprocedural laboratory examination: Secondary | ICD-10-CM | POA: Insufficient documentation

## 2021-04-09 DIAGNOSIS — E785 Hyperlipidemia, unspecified: Secondary | ICD-10-CM | POA: Diagnosis not present

## 2021-04-09 DIAGNOSIS — Z20822 Contact with and (suspected) exposure to covid-19: Secondary | ICD-10-CM | POA: Diagnosis not present

## 2021-04-09 DIAGNOSIS — J449 Chronic obstructive pulmonary disease, unspecified: Secondary | ICD-10-CM | POA: Insufficient documentation

## 2021-04-09 DIAGNOSIS — Z7901 Long term (current) use of anticoagulants: Secondary | ICD-10-CM | POA: Insufficient documentation

## 2021-04-09 DIAGNOSIS — Z7902 Long term (current) use of antithrombotics/antiplatelets: Secondary | ICD-10-CM | POA: Diagnosis not present

## 2021-04-09 DIAGNOSIS — I10 Essential (primary) hypertension: Secondary | ICD-10-CM | POA: Diagnosis not present

## 2021-04-09 DIAGNOSIS — Z7982 Long term (current) use of aspirin: Secondary | ICD-10-CM | POA: Diagnosis not present

## 2021-04-09 DIAGNOSIS — Z87891 Personal history of nicotine dependence: Secondary | ICD-10-CM | POA: Diagnosis not present

## 2021-04-09 DIAGNOSIS — I739 Peripheral vascular disease, unspecified: Secondary | ICD-10-CM | POA: Diagnosis not present

## 2021-04-09 DIAGNOSIS — I2510 Atherosclerotic heart disease of native coronary artery without angina pectoris: Secondary | ICD-10-CM | POA: Diagnosis not present

## 2021-04-09 HISTORY — DX: Emphysema, unspecified: J43.9

## 2021-04-09 HISTORY — DX: Prediabetes: R73.03

## 2021-04-09 HISTORY — DX: Personal history of other diseases of the digestive system: Z87.19

## 2021-04-09 HISTORY — DX: Pneumonia, unspecified organism: J18.9

## 2021-04-09 LAB — URINALYSIS, ROUTINE W REFLEX MICROSCOPIC
Bilirubin Urine: NEGATIVE
Glucose, UA: NEGATIVE mg/dL
Hgb urine dipstick: NEGATIVE
Ketones, ur: NEGATIVE mg/dL
Leukocytes,Ua: NEGATIVE
Nitrite: NEGATIVE
Protein, ur: NEGATIVE mg/dL
Specific Gravity, Urine: <1.005 — ABNORMAL LOW (ref 1.005–1.030)
pH: 5.5 (ref 5.0–8.0)

## 2021-04-09 LAB — CBC
HCT: 47.1 % (ref 39.0–52.0)
Hemoglobin: 15.3 g/dL (ref 13.0–17.0)
MCH: 30.6 pg (ref 26.0–34.0)
MCHC: 32.5 g/dL (ref 30.0–36.0)
MCV: 94.2 fL (ref 80.0–100.0)
Platelets: 298 10*3/uL (ref 150–400)
RBC: 5 MIL/uL (ref 4.22–5.81)
RDW: 13.1 % (ref 11.5–15.5)
WBC: 6.6 10*3/uL (ref 4.0–10.5)
nRBC: 0 % (ref 0.0–0.2)

## 2021-04-09 LAB — HEMOGLOBIN A1C
Hgb A1c MFr Bld: 6.5 % — ABNORMAL HIGH (ref 4.8–5.6)
Mean Plasma Glucose: 140 mg/dL

## 2021-04-09 LAB — SARS CORONAVIRUS 2 (TAT 6-24 HRS): SARS Coronavirus 2: NEGATIVE

## 2021-04-09 LAB — COMPREHENSIVE METABOLIC PANEL
ALT: 18 U/L (ref 0–44)
AST: 20 U/L (ref 15–41)
Albumin: 3.9 g/dL (ref 3.5–5.0)
Alkaline Phosphatase: 74 U/L (ref 38–126)
Anion gap: 10 (ref 5–15)
BUN: 15 mg/dL (ref 8–23)
CO2: 23 mmol/L (ref 22–32)
Calcium: 9.4 mg/dL (ref 8.9–10.3)
Chloride: 109 mmol/L (ref 98–111)
Creatinine, Ser: 0.97 mg/dL (ref 0.61–1.24)
GFR, Estimated: >60 mL/min (ref >60)
Glucose, Bld: 81 mg/dL (ref 70–99)
Potassium: 4.4 mmol/L (ref 3.5–5.1)
Sodium: 142 mmol/L (ref 135–145)
Total Bilirubin: 0.7 mg/dL (ref 0.3–1.2)
Total Protein: 7.1 g/dL (ref 6.5–8.1)

## 2021-04-09 LAB — PROTIME-INR
INR: 1.1 (ref 0.8–1.2)
Prothrombin Time: 13.8 seconds (ref 11.4–15.2)

## 2021-04-09 LAB — SURGICAL PCR SCREEN
MRSA, PCR: NEGATIVE
Staphylococcus aureus: NEGATIVE

## 2021-04-09 LAB — TYPE AND SCREEN
ABO/RH(D): O POS
Antibody Screen: NEGATIVE

## 2021-04-09 LAB — APTT: aPTT: 31 seconds (ref 24–36)

## 2021-04-09 NOTE — Progress Notes (Signed)
PCP - Pablo Lawrence, NP w/ Kip A. Corrington, MD @ [place] Cardiologist - Shelva Majestic, MD Pulmonologist- Chi Rodman Pickle, MD  PPM/ICD - Denies  Chest x-ray - 01/01/21- 1-view EKG - 02/10/21 Stress Test - 02/12/21 ECHO - 01/02/21 Cardiac Cath - Denies  Sleep Study - Denies  Per pt, he is Pre-diabetic, does not check CBGs at home. A1C obtained today.  Blood Thinner Instructions: Continue Plavix until DOS Aspirin Instructions: Continue until DOS  ERAS Protcol - Not ordered PRE-SURGERY Ensure or G2- N/A  COVID TEST- 04/09/21; results pending   Anesthesia review: Yes, cardiac & pulmonary hx.  Patient denies shortness of breath, fever, cough and chest pain at PAT appointment   All instructions explained to the patient, with a verbal understanding of the material. Patient agrees to go over the instructions while at home for a better understanding. The opportunity to ask questions was provided.

## 2021-04-09 NOTE — Progress Notes (Addendum)
Confirmed w/ Minette Brine, RN: pt to take ASA and Plavix up until DOS.

## 2021-04-09 NOTE — Pre-Procedure Instructions (Signed)
Surgical Instructions:    Your procedure is scheduled on Monday, September 26th (07:30 AM- 1:30 PM).  Report to [facility] Main Entrance "A" at 05:30 A.M., then check in with the Admitting office.  Call this number if you have any questions prior to your surgery date, or have problems the morning of surgery:  [phone]    Remember:  Do not eat or drink after midnight the night before your surgery.     Take these medicines the morning of surgery with A SIP OF WATER:  amLODipine (NORVASC) aspirin EC clopidogrel (PLAVIX) metoprolol succinate (TOPROL-XL) montelukast (SINGULAIR) rosuvastatin (CRESTOR)  IF NEEDED: acetaminophen (TYLENOL) baclofen (LIORESAL) dexlansoprazole (DEXILANT)  nitroGLYCERIN (NITROSTAT) Glycopyrrolate-Formoterol (BEVESPI AEROSPHERE) inhaler- Please bring with you the day of surgery albuterol (PROVENTIL) nebulizer treatment   As of today, STOP taking any Aleve, Naproxen, Ibuprofen, Motrin, Advil, Goody's, BC's, all herbal medications, fish oil, and all vitamins.             Special instructions:    [place]- Preparing For Surgery  Before surgery, you can play an important role. Because skin is not sterile, your skin needs to be as free of germs as possible. You can reduce the number of germs on your skin by washing with CHG (chlorahexidine gluconate) Soap before surgery.  CHG is an antiseptic cleaner which kills germs and bonds with the skin to continue killing germs even after washing.     Please do not use if you have an allergy to CHG or antibacterial soaps. If your skin becomes reddened/irritated stop using the CHG.  Do not shave (including legs and underarms) for at least 48 hours prior to first CHG shower. It is OK to shave your face.  Please follow these instructions carefully.     Shower the NIGHT BEFORE SURGERY and the MORNING OF SURGERY with CHG Soap.   If you chose to wash your hair, wash your hair first as usual with your normal  shampoo. After you shampoo, rinse your hair and body thoroughly to remove the shampoo.  Then [employer] and genitals (private parts) with your normal soap and rinse thoroughly to remove soap.  After that Use CHG Soap as you would any other liquid soap. You can apply CHG directly to the skin and wash gently with a clean washcloth.   Apply the CHG Soap to your body ONLY FROM THE NECK DOWN.  Do not use on open wounds or open sores. Avoid contact with your eyes, ears, mouth and genitals (private parts). Wash Face and genitals (private parts)  with your normal soap.   Wash thoroughly, paying special attention to the area where your surgery will be performed.  Thoroughly rinse your body with warm water from the neck down.  DO NOT shower/wash with your normal soap after using and rinsing off the CHG Soap.  Pat yourself dry with a CLEAN TOWEL.  Wear CLEAN PAJAMAS to bed the night before surgery.  Place CLEAN SHEETS on your bed the night before your surgery.  DO NOT SLEEP WITH PETS.   Day of Surgery:  Take a shower with CHG soap. Wear Clean/Comfortable clothing the morning of surgery Do not wear lotions, powders, colognes, or deodorant.   Remember to brush your teeth WITH YOUR REGULAR TOOTHPASTE. Do not wear jewelry. Do not shave 48 hours prior to surgery.  Men may shave face and neck. Do not bring valuables to the hospital. [facility] is not responsible for any belongings or valuables.  Do NOT  Smoke (Tobacco/Vaping) or drink Alcohol 24 hours prior to your procedure.  If you use a CPAP at night, you may bring all equipment for your overnight stay.   Contacts, glasses, dentures or bridgework may not be worn into surgery, please bring cases for these belongings.   For patients admitted to the hospital, discharge time will be determined by your treatment team.  Patients discharged the day of surgery will not be allowed to drive home, and someone needs to stay with them for 24 hours.  NO  VISITORS WILL BE ALLOWED IN PRE-OP WHERE PATIENTS GET READY FOR SURGERY.  ONLY 1 SUPPORT PERSON MAY BE PRESENT WHILE YOU ARE IN SURGERY.  IF YOU ARE TO BE ADMITTED, ONCE YOU ARE IN YOUR ROOM YOU WILL BE ALLOWED TWO (2) VISITORS.  Minor children may have two parents present. Special consideration for safety and communication needs will be reviewed on a case by case basis.    Please read over the following fact sheets that you were given.

## 2021-04-10 NOTE — Anesthesia Preprocedure Evaluation (Addendum)
Anesthesia Evaluation  Patient identified by MRN, date of birth, ID band Patient awake    Reviewed: Allergy & Precautions, NPO status , Patient's Chart, lab work & pertinent test results  Airway Mallampati: II  TM Distance: >3 FB Neck ROM: Full    Dental  (+) Dental Advisory Given   Pulmonary COPD, former smoker,    breath sounds clear to auscultation       Cardiovascular hypertension, Pt. on medications and Pt. on home beta blockers + CAD and + Peripheral Vascular Disease   Rhythm:Regular Rate:Normal     Neuro/Psych negative neurological ROS     GI/Hepatic Neg liver ROS, hiatal hernia, GERD  ,  Endo/Other  diabetes  Renal/GU negative Renal ROS     Musculoskeletal  (+) Arthritis ,   Abdominal   Peds  Hematology negative hematology ROS (+)   Anesthesia Other Findings   Reproductive/Obstetrics                            Lab Results  Component Value Date   WBC 6.6 04/09/2021   HGB 15.3 04/09/2021   HCT 47.1 04/09/2021   MCV 94.2 04/09/2021   PLT 298 04/09/2021   Lab Results  Component Value Date   CREATININE 0.97 04/09/2021   BUN 15 04/09/2021   NA 142 04/09/2021   K 4.4 04/09/2021   CL 109 04/09/2021   CO2 23 04/09/2021   Myocardial Perfusion 02/12/2021 . The left ventricular ejection fraction is normal (55-65%). . Nuclear stress EF: 65%. . There was no ST segment deviation noted during stress. Marland Kitchen No T wave inversion was noted during stress. . Defect 1: There is a small defect of mild severity present in the apical inferior location. . Findings consistent with a small region of ischemia. . This is a low risk study. - Reviewed by Shelva Majestic, MD, "Low risk nuclear perfusion study with EF 65%. No ST segment changes. Minimal apical inferior defect; doubt significant."   Echo 01/02/2021 IMPRESSIONS  1. Left ventricular ejection fraction, by estimation, is 55 to 60%. The   left ventricle has normal function. The left ventricle has no regional  wall motion abnormalities. There is mild left ventricular hypertrophy.  Left ventricular diastolic parameters  are consistent with Grade I diastolic dysfunction (impaired relaxation).  2. Right ventricular systolic function is normal. The right ventricular  size is normal.  3. The mitral valve is normal in structure. No evidence of mitral valve  regurgitation. No evidence of mitral stenosis.  4. The aortic valve is tricuspid. There is mild calcification of the  aortic valve. There is mild thickening of the aortic valve. Aortic valve  regurgitation is not visualized. No aortic stenosis is present.  5. The inferior vena cava is normal in size with greater than 50%  respiratory variability, suggesting right atrial pressure of 3 mmHg.   Anesthesia Physical Anesthesia Plan  ASA: 3  Anesthesia Plan: General   Post-op Pain Management:    Induction: Intravenous  PONV Risk Score and Plan: 2 and Dexamethasone, Ondansetron and Treatment may vary due to age or medical condition  Airway Management Planned: Oral ETT  Additional Equipment:   Intra-op Plan:   Post-operative Plan: Extubation in OR  Informed Consent: I have reviewed the patients History and Physical, chart, labs and discussed the procedure including the risks, benefits and alternatives for the proposed anesthesia with the patient or authorized representative who has indicated his/her understanding and acceptance.  Dental advisory given  Plan Discussed with:   Anesthesia Plan Comments:        Anesthesia Quick Evaluation

## 2021-04-10 NOTE — Progress Notes (Signed)
Anesthesia Chart Review:  Case: [id number] Date/Time: 04/13/21 0715   Procedures:      RIGHT COMMON FEMORAL ARTERY ENDARTERECTOMY (Right)     RIGHT LOWER EXTREMITY ANGIOPLASTY (Right)   Anesthesia type: General   Pre-op diagnosis: PVD   Location: [facility] OR ROOM 16 / [place] [facility]   Surgeons: [name], MD       DISCUSSION: Patient is a 75 year old male scheduled for the above procedure.  History includes former smoker (80 pack years, quit 07/20/95), COPD, esophageal carcinoma (T1bN1M0, s/p transhiatal esophagectomy and pyloroplasty 06/19/2009, radiation, declined chemo), CAD (CABG 1998), HLD, HTN, pre-diabetes, hiatal hernia, GERD, PVD (s/p femoral-above knee popliteal bypass 1993; left EIA stent 04/01/21), carotid artery disease (3-87% RICA, 56-43% LICA 09/2949), tachyarrhythmia (s/p ablation 1999, glaucoma, Barrett's esophagus (2019), THA (left 01/30/19).     Last cardiology visit with Dr. Claiborne Billings on 02/10/21 for routine follow-up, but also hospitalized 01/01/21 for syncope in setting of hypotension (BP 65/34) and volume depletion. CTA negative for PE. Echo showed normal LVEF, grade 1 DD, no AS. Antihypertensive medications were adjusted. He was having claudication symptoms. LE arterial Doppler and Lexiscan Myoview ordered. Stress test was considered low risk (minim apical inferior defect not felt to be significant). He referred patient to vascular surgery for dampened monophasic waveform and absent great toe pressure on the right.   Last pulmonology visit with Dr. Loanne Drilling 11/05/20. Moderately severe COPD (FEV1 59%) Gold Class B felt "very well controlled." Stable RLL lung nodule since 2015, no further imaging recommended.   04/09/2021 presurgical COVID-19 test negative.  S/p left EIA stent 04/01/21. Per VVS, continue Plavix and aspirin until day of surgery.  Anesthesia team to evaluate on the day of surgery.   VS: BP (!) 151/68   Pulse 79   Temp 36.6 C (Oral)   Resp 17   Ht 5\' 8"  (1.727 m)   Wt 71.6  kg   SpO2 98%   BMI 24.01 kg/m   PROVIDERS: Corrington, Kip A, MD is PCP Shelva Majestic, MD is Cardiologist  Margaretha Seeds, MD is Pulmonologist  Garfield Cornea, MD is GI   LABS: Labs reviewed: Acceptable for surgery. (all labs ordered are listed, but only abnormal results are displayed)  Labs Reviewed  HEMOGLOBIN A1C - Abnormal; Notable for the following components:      Result Value   Hgb A1c MFr Bld 6.5 (*)    All other components within normal limits  URINALYSIS, ROUTINE W REFLEX MICROSCOPIC - Abnormal; Notable for the following components:   Specific Gravity, Urine <1.005 (*)    All other components within normal limits  SURGICAL PCR SCREEN  SARS CORONAVIRUS 2 (TAT 6-24 HRS)  CBC  COMPREHENSIVE METABOLIC PANEL  PROTIME-INR  APTT  TYPE AND SCREEN   Spirometry 03/23/16: FVC 4.1 (101%), FEV1 1.8 (59%), FEV1/FVC 43% (58%), FEF 25-75% 0.4 (20%). Moderately severe obstruction. Normal vital capacity.   IMAGES: CTA CHest 01/01/21: IMPRESSION: 1. No evidence of significant pulmonary embolus. 2. Postoperative changes with esophagectomy and gastric pull up procedure. 3. Emphysematous changes in the lungs. Scarring or atelectasis in the lung bases. 4. 9 mm nodule in the superior segment of the right lower lung. No change since previous study. Long-term stability suggests benign etiology.   1V CXR 01/01/21: IMPRESSION: No acute finding. Previous CABG. Background emphysema with pulmonary scarring. Previous gastric pull-through procedure.    EKG: 02/10/2021 ([facility]): Sinus rhythm with first-degree AV block Right bundle branch block   CV: Myocardial Perfusion 02/12/2021  The  left ventricular ejection fraction is normal (55-65%). Nuclear stress EF: 65%. There was no ST segment deviation noted during stress. No T wave inversion was noted during stress. Defect 1: There is a small defect of mild severity present in the apical inferior location. Findings consistent  with a small region of ischemia. This is a low risk study. - Reviewed by Shelva Majestic, MD, "Low risk nuclear perfusion study with EF 65%.  No ST segment changes.  Minimal apical inferior defect; doubt significant."   Echo 01/02/2021 IMPRESSIONS   1. Left ventricular ejection fraction, by estimation, is 55 to 60%. The  left ventricle has normal function. The left ventricle has no regional  wall motion abnormalities. There is mild left ventricular hypertrophy.  Left ventricular diastolic parameters  are consistent with Grade I diastolic dysfunction (impaired relaxation).   2. Right ventricular systolic function is normal. The right ventricular  size is normal.   3. The mitral valve is normal in structure. No evidence of mitral valve  regurgitation. No evidence of mitral stenosis.   4. The aortic valve is tricuspid. There is mild calcification of the  aortic valve. There is mild thickening of the aortic valve. Aortic valve  regurgitation is not visualized. No aortic stenosis is present.   5. The inferior vena cava is normal in size with greater than 50%  respiratory variability, suggesting right atrial pressure of 3 mmHg.    US Carotid 04/09/2020 Summary:  Right Carotid: Velocities in the right ICA are consistent with a 1-39%  stenosis. Non-hemodynamically significant plaque <50% noted in the  CCA.  Left Carotid: Velocities in the left ICA are consistent with a 40-59%  stenosis. Non-hemodynamically significant plaque <50% noted in the  CCA. Stenosis based on peak systolic velocities.  Vertebrals:  Right vertebral artery demonstrates antegrade flow. Left  vertebral artery demonstrates an occlusion.  Subclavians: Bilateral subclavian arteries were stenotic. Bilateral  subclavian artery flow was disturbed.   Past Medical History:  Diagnosis Date   Adenomatous colon polyp 03/2009   Last colonoscopy by Dr. [name]    Adenomatous polyp 2010   Adenomatous polyp of colon 11/03/2010    Arthritis    Barrett's esophagus    CAD (coronary artery disease)    Complication of anesthesia    has a shortened esophogus due to CA.    COPD (chronic obstructive pulmonary disease) (HCC)    Severe emphysema per CT   Diverticulosis    Emphysema of lung (HCC)    Esophageal carcinoma ([place]) 03/2009   T1N1M0   GERD (gastroesophageal reflux disease)    Glaucoma    History of Doppler ultrasound 11/09/2011   03/2014- 50-69% L ICA stenosis;carotid doppler; L bulb/prox ICA 0-49% diameter reduction; L vertebral artery - occlusive ds; L ECA  demonstrates severe amount of fibrous plaque   History of Doppler ultrasound 11/09/2011   LEAs; R ABI - mod art. insuff.; L ABI normal at rest; R SFA - occlusive ds; L SFA - occlusive ds; patent fem-pop graft   History of echocardiogram 08/27/2009   EF >55%   History of hiatal hernia    History of kidney stones 1965   History of nuclear stress test 11/24/2011   lexiscan; normal perfusion; low risk scan; non-diagnostic for ischemia   Hyperlipidemia    Hypertension    Left carotid artery stenosis 04/08/2014   Pneumonia    Pre-diabetes    Pulmonary nodule, right 04/08/2014   2.8 mm-incidental finding on CT   PVD (peripheral vascular disease) (  [place])    Tachyarrhythmia 1999   Status post ablation at [facility]   Tobacco abuse     Past Surgical History:  Procedure Laterality Date   ABDOMINAL AORTOGRAM W/LOWER EXTREMITY N/A 04/01/2021   Procedure: ABDOMINAL AORTOGRAM W/LOWER EXTREMITY;  Surgeon: [name], MD;  Location: [place] [facility];  Service: Cardiovascular;  Laterality: N/A;   BIOPSY  10/24/2017   Procedure: BIOPSY;  Surgeon: [name], MD;  Location: [facility];  Service: Endoscopy;;  esophagus   CATARACT EXTRACTION Bilateral    COLONOSCOPY  03/17/2009   Dr.Rourk- normal rectum, sigmoid diverticula, some pale sigmoid mucosa with diffuse petechiae. pedunculated polyp at the splenic flexure, remainder of colonic mucosa appeared  normal. bx= adenomatous polyp   COLONOSCOPY  04/19/2003   Dr.Rehman- few diverticu;a at the sigmoid colon, 3 small polyps, one at the transverse colon and 2 at the sigmoid, small external hemorrhoids. bx report not available.    COLONOSCOPY WITH PROPOFOL N/A 10/24/2017   Dr. [name]: Diverticulosis, 11 mm polyp at the ileocecal valve, tubular adenoma.  Repeat colonoscopy in 3 years   CORONARY ARTERY BYPASS GRAFT  1998   [name]   ESOPHAGECTOMY  2010   [facility] Dr. [name]   ESOPHAGOGASTRODUODENOSCOPY  11/25/2010   Dr.Rourk- s/p esophagectomy with gastric pull-up, esophageal erosions straddling the surgical anastomosis, salmon colored epithelium coming up a good centimeter to a centimeter and a half above the suture line, islands of salmon colored epithelium in the most poximal residual esophagus, remainder of gastric mucosa appeared normal. bx= swamous &gastric glandular mucosa w/chronic active inflammation   ESOPHAGOGASTRODUODENOSCOPY  03/17/2009   Dr.Rourk- 4cm segment of salmon-colored epithlium distal esophagus suspicious for barretts esophagus. area of suspicious nodularity w/in this segment bx seperately. small to moderate size hiatal hernia, o/w normal stomach D1 and D2 bx=adenocarcinoma   ESOPHAGOGASTRODUODENOSCOPY (EGD) WITH PROPOFOL N/A 10/24/2017   Dr. [name]: Remnant of esophagus with anastomosis with stomach at 24 cm from the incisors, 2 cm above the anastomosis he was found to have Barrett's but no dysplasia.  Advised for repeat EGD in 3 years   EYE SURGERY     cataract   FEMORAL-POPLITEAL BYPASS GRAFT  1993   occlusive ds in R SFA   GASTRIC PULL THROUGH  2010   With esophagectomy   JOINT REPLACEMENT     LIPOMA EXCISION  2010   PERIPHERAL VASCULAR INTERVENTION  04/01/2021   Procedure: PERIPHERAL VASCULAR INTERVENTION;  Surgeon: [name], MD;  Location: [place] [facility];  Service: Cardiovascular;;   POLYPECTOMY  10/24/2017   Procedure: POLYPECTOMY;  Surgeon:  [name], MD;  Location: [facility];  Service: Endoscopy;;  colon    TOTAL HIP ARTHROPLASTY Left 01/30/2019   Procedure: TOTAL HIP ARTHROPLASTY ANTERIOR APPROACH;  Surgeon: Renette Butters, MD;  Location: [facility];  Service: Orthopedics;  Laterality: Left;    MEDICATIONS:  acetaminophen (TYLENOL) 500 MG tablet   albuterol (PROVENTIL) (2.5 MG/3ML) 0.083% nebulizer solution   ALPRAZolam (XANAX) 0.5 MG tablet   amLODipine (NORVASC) 10 MG tablet   aspirin EC 81 MG tablet   baclofen (LIORESAL) 10 MG tablet   clopidogrel (PLAVIX) 75 MG tablet   dexlansoprazole (DEXILANT) 60 MG capsule   Glycopyrrolate-Formoterol (BEVESPI AEROSPHERE) 9-4.8 MCG/ACT AERO   guaiFENesin (MUCINEX) 600 MG 12 hr tablet   latanoprost (XALATAN) 0.005 % ophthalmic solution   losartan (COZAAR) 100 MG tablet   metoprolol succinate (TOPROL-XL) 50 MG 24 hr tablet   montelukast (  SINGULAIR) 10 MG tablet   Multiple Vitamins-Minerals (CENTRUM SILVER PO)   nitroGLYCERIN (NITROSTAT) 0.4 MG SL tablet   rosuvastatin (CRESTOR) 10 MG tablet   No current facility-administered medications for this encounter.    Myra Gianotti, PA-C Surgical Short Stay/Anesthesiology [facility] Phone [phone] [facility] Phone [phone] 04/10/2021 11:56 AM

## 2021-04-13 ENCOUNTER — Inpatient Hospital Stay (HOSPITAL_COMMUNITY)
Admission: RE | Admit: 2021-04-13 | Discharge: 2021-04-15 | DRG: 271 | Disposition: A | Discharge status: Home / Self Care | Payer: Medicare Other | Attending: Vascular Surgery | Admitting: Vascular Surgery

## 2021-04-13 ENCOUNTER — Other Ambulatory Visit: Payer: Self-pay

## 2021-04-13 ENCOUNTER — Inpatient Hospital Stay (HOSPITAL_COMMUNITY): Payer: Medicare Other | Admitting: Vascular Surgery

## 2021-04-13 ENCOUNTER — Inpatient Hospital Stay (HOSPITAL_COMMUNITY): Payer: Medicare Other

## 2021-04-13 ENCOUNTER — Inpatient Hospital Stay (HOSPITAL_COMMUNITY): Payer: Medicare Other | Admitting: Certified Registered"

## 2021-04-13 ENCOUNTER — Encounter (HOSPITAL_COMMUNITY)
Admission: RE | Disposition: A | Discharge status: Home / Self Care | Payer: Self-pay | Source: Home / Self Care | Attending: Vascular Surgery

## 2021-04-13 ENCOUNTER — Encounter (HOSPITAL_COMMUNITY): Payer: Self-pay | Admitting: Vascular Surgery

## 2021-04-13 DIAGNOSIS — Z7951 Long term (current) use of inhaled steroids: Secondary | ICD-10-CM | POA: Diagnosis not present

## 2021-04-13 DIAGNOSIS — E785 Hyperlipidemia, unspecified: Secondary | ICD-10-CM | POA: Diagnosis present

## 2021-04-13 DIAGNOSIS — T82858A Stenosis of vascular prosthetic devices, implants and grafts, initial encounter: Secondary | ICD-10-CM | POA: Diagnosis present

## 2021-04-13 DIAGNOSIS — Z86010 Personal history of colonic polyps: Secondary | ICD-10-CM | POA: Diagnosis not present

## 2021-04-13 DIAGNOSIS — Y712 Prosthetic and other implants, materials and accessory cardiovascular devices associated with adverse incidents: Secondary | ICD-10-CM | POA: Diagnosis present

## 2021-04-13 DIAGNOSIS — Z7902 Long term (current) use of antithrombotics/antiplatelets: Secondary | ICD-10-CM

## 2021-04-13 DIAGNOSIS — Z888 Allergy status to other drugs, medicaments and biological substances status: Secondary | ICD-10-CM | POA: Diagnosis not present

## 2021-04-13 DIAGNOSIS — Z79899 Other long term (current) drug therapy: Secondary | ICD-10-CM | POA: Diagnosis not present

## 2021-04-13 DIAGNOSIS — Z87442 Personal history of urinary calculi: Secondary | ICD-10-CM | POA: Diagnosis not present

## 2021-04-13 DIAGNOSIS — I70213 Atherosclerosis of native arteries of extremities with intermittent claudication, bilateral legs: Secondary | ICD-10-CM | POA: Diagnosis present

## 2021-04-13 DIAGNOSIS — M1990 Unspecified osteoarthritis, unspecified site: Secondary | ICD-10-CM | POA: Diagnosis present

## 2021-04-13 DIAGNOSIS — K219 Gastro-esophageal reflux disease without esophagitis: Secondary | ICD-10-CM | POA: Diagnosis present

## 2021-04-13 DIAGNOSIS — Z96642 Presence of left artificial hip joint: Secondary | ICD-10-CM | POA: Diagnosis present

## 2021-04-13 DIAGNOSIS — Z8501 Personal history of malignant neoplasm of esophagus: Secondary | ICD-10-CM | POA: Diagnosis not present

## 2021-04-13 DIAGNOSIS — J439 Emphysema, unspecified: Secondary | ICD-10-CM | POA: Diagnosis present

## 2021-04-13 DIAGNOSIS — I2510 Atherosclerotic heart disease of native coronary artery without angina pectoris: Secondary | ICD-10-CM | POA: Diagnosis present

## 2021-04-13 DIAGNOSIS — Z7982 Long term (current) use of aspirin: Secondary | ICD-10-CM

## 2021-04-13 DIAGNOSIS — I10 Essential (primary) hypertension: Secondary | ICD-10-CM | POA: Diagnosis present

## 2021-04-13 DIAGNOSIS — Z8249 Family history of ischemic heart disease and other diseases of the circulatory system: Secondary | ICD-10-CM

## 2021-04-13 DIAGNOSIS — Z87891 Personal history of nicotine dependence: Secondary | ICD-10-CM | POA: Diagnosis not present

## 2021-04-13 DIAGNOSIS — R710 Precipitous drop in hematocrit: Secondary | ICD-10-CM | POA: Diagnosis not present

## 2021-04-13 DIAGNOSIS — H409 Unspecified glaucoma: Secondary | ICD-10-CM | POA: Diagnosis present

## 2021-04-13 DIAGNOSIS — I739 Peripheral vascular disease, unspecified: Secondary | ICD-10-CM | POA: Diagnosis present

## 2021-04-13 HISTORY — PX: ENDARTERECTOMY FEMORAL: SHX5804

## 2021-04-13 HISTORY — PX: INSERTION OF ILIAC STENT: SHX6256

## 2021-04-13 HISTORY — PX: ULTRASOUND GUIDANCE FOR VASCULAR ACCESS: SHX6516

## 2021-04-13 HISTORY — PX: LOWER EXTREMITY ANGIOGRAM: SHX5508

## 2021-04-13 LAB — CBC
HCT: 32.5 % — ABNORMAL LOW (ref 39.0–52.0)
Hemoglobin: 10.9 g/dL — ABNORMAL LOW (ref 13.0–17.0)
MCH: 30.6 pg (ref 26.0–34.0)
MCHC: 33.5 g/dL (ref 30.0–36.0)
MCV: 91.3 fL (ref 80.0–100.0)
Platelets: 248 10*3/uL (ref 150–400)
RBC: 3.56 MIL/uL — ABNORMAL LOW (ref 4.22–5.81)
RDW: 13.1 % (ref 11.5–15.5)
WBC: 14.6 10*3/uL — ABNORMAL HIGH (ref 4.0–10.5)
nRBC: 0 % (ref 0.0–0.2)

## 2021-04-13 LAB — POCT ACTIVATED CLOTTING TIME
Activated Clotting Time: 132 seconds
Activated Clotting Time: 231 seconds
Activated Clotting Time: 283 seconds
Activated Clotting Time: 237 seconds
Activated Clotting Time: 271 seconds
Activated Clotting Time: 231 seconds
Activated Clotting Time: 236 seconds
Activated Clotting Time: 109 seconds
Activated Clotting Time: 283 seconds

## 2021-04-13 LAB — ABO/RH: ABO/RH(D): O POS

## 2021-04-13 LAB — CREATININE, SERUM
Creatinine, Ser: 0.74 mg/dL (ref 0.61–1.24)
GFR, Estimated: >60 mL/min (ref >60)

## 2021-04-13 LAB — GLUCOSE, CAPILLARY: Glucose-Capillary: 86 mg/dL (ref 70–99)

## 2021-04-13 SURGERY — ENDARTERECTOMY, FEMORAL
Anesthesia: General | Site: Groin | Laterality: Right

## 2021-04-13 MED ORDER — LABETALOL HCL 5 MG/ML IV SOLN
INTRAVENOUS | Status: AC
  Filled 2021-04-13: qty 4

## 2021-04-13 MED ORDER — CEFAZOLIN SODIUM-DEXTROSE 2-4 GM/100ML-% IV SOLN
2.0000 g | INTRAVENOUS | Status: AC
  Administered 2021-04-13 (×2): 2 g via INTRAVENOUS
  Filled 2021-04-13: qty 100

## 2021-04-13 MED ORDER — CEFAZOLIN SODIUM-DEXTROSE 2-4 GM/100ML-% IV SOLN
2.0000 g | Freq: Three times a day (TID) | INTRAVENOUS | Status: AC
Start: 2021-04-13 — End: 2021-04-14
  Administered 2021-04-13 – 2021-04-14 (×2): 2 g via INTRAVENOUS
  Filled 2021-04-13: qty 100

## 2021-04-13 MED ORDER — PHENYLEPHRINE 40 MCG/ML (10ML) SYRINGE FOR IV PUSH (FOR BLOOD PRESSURE SUPPORT)
PREFILLED_SYRINGE | INTRAVENOUS | Status: AC
  Filled 2021-04-13: qty 10

## 2021-04-13 MED ORDER — LABETALOL HCL 5 MG/ML IV SOLN
5.0000 mg | Freq: Once | INTRAVENOUS | Status: AC
  Administered 2021-04-13: 5 mg via INTRAVENOUS

## 2021-04-13 MED ORDER — FENTANYL CITRATE (PF) 250 MCG/5ML IJ SOLN
INTRAMUSCULAR | Status: AC
  Filled 2021-04-13: qty 5

## 2021-04-13 MED ORDER — ORAL CARE MOUTH RINSE: 15.0000 mL | Freq: Once | OROMUCOSAL | Status: AC

## 2021-04-13 MED ORDER — PROTAMINE SULFATE 10 MG/ML IV SOLN
INTRAVENOUS | Status: AC
  Filled 2021-04-13: qty 5

## 2021-04-13 MED ORDER — ASPIRIN EC 81 MG PO TBEC
81.0000 mg | DELAYED_RELEASE_TABLET | Freq: Every day | ORAL | Status: DC
  Administered 2021-04-14 – 2021-04-15 (×2): 81 mg via ORAL
  Filled 2021-04-13 (×2): qty 1

## 2021-04-13 MED ORDER — ACETAMINOPHEN 500 MG PO TABS: 1000.0000 mg | ORAL_TABLET | Freq: Once | ORAL | Status: DC

## 2021-04-13 MED ORDER — ACETAMINOPHEN 650 MG RE SUPP: 325.0000 mg | RECTAL | Status: DC | PRN

## 2021-04-13 MED ORDER — SUGAMMADEX SODIUM 200 MG/2ML IV SOLN
INTRAVENOUS | Status: DC | PRN
  Administered 2021-04-13: 200 mg via INTRAVENOUS

## 2021-04-13 MED ORDER — BACLOFEN 10 MG PO TABS
10.0000 mg | ORAL_TABLET | Freq: Three times a day (TID) | ORAL | Status: DC | PRN
  Filled 2021-04-13: qty 1

## 2021-04-13 MED ORDER — ALBUTEROL SULFATE (2.5 MG/3ML) 0.083% IN NEBU: 2.5000 mg | INHALATION_SOLUTION | Freq: Four times a day (QID) | RESPIRATORY_TRACT | Status: DC | PRN

## 2021-04-13 MED ORDER — LOSARTAN POTASSIUM 50 MG PO TABS
100.0000 mg | ORAL_TABLET | Freq: Every day | ORAL | Status: DC
  Administered 2021-04-14: 100 mg via ORAL
  Filled 2021-04-13: qty 2

## 2021-04-13 MED ORDER — PHENOL 1.4 % MT LIQD: 1.0000 | OROMUCOSAL | Status: DC | PRN

## 2021-04-13 MED ORDER — CHLORHEXIDINE GLUCONATE CLOTH 2 % EX PADS: 6.0000 | MEDICATED_PAD | Freq: Once | CUTANEOUS | Status: DC

## 2021-04-13 MED ORDER — HEPARIN 6000 UNIT IRRIGATION SOLUTION
Status: AC
  Filled 2021-04-13: qty 500

## 2021-04-13 MED ORDER — LABETALOL HCL 5 MG/ML IV SOLN: 10.0000 mg | INTRAVENOUS | Status: DC | PRN

## 2021-04-13 MED ORDER — ONDANSETRON HCL 4 MG/2ML IJ SOLN
INTRAMUSCULAR | Status: AC
  Filled 2021-04-13: qty 2

## 2021-04-13 MED ORDER — METOPROLOL TARTRATE 5 MG/5ML IV SOLN: 2.0000 mg | INTRAVENOUS | Status: DC | PRN

## 2021-04-13 MED ORDER — GUAIFENESIN-DM 100-10 MG/5ML PO SYRP: 15.0000 mL | ORAL_SOLUTION | ORAL | Status: DC | PRN

## 2021-04-13 MED ORDER — PROTAMINE SULFATE 10 MG/ML IV SOLN
INTRAVENOUS | Status: DC | PRN
  Administered 2021-04-13 (×5): 10 mg via INTRAVENOUS

## 2021-04-13 MED ORDER — NITROGLYCERIN 0.4 MG SL SUBL: 0.4000 mg | SUBLINGUAL_TABLET | SUBLINGUAL | Status: DC | PRN

## 2021-04-13 MED ORDER — IODIXANOL 320 MG/ML IV SOLN
INTRAVENOUS | Status: DC | PRN
  Administered 2021-04-13: 65 mL via INTRA_ARTERIAL

## 2021-04-13 MED ORDER — HEPARIN SODIUM (PORCINE) 1000 UNIT/ML IJ SOLN
INTRAMUSCULAR | Status: AC
  Filled 2021-04-13: qty 1

## 2021-04-13 MED ORDER — FENTANYL CITRATE (PF) 100 MCG/2ML IJ SOLN: 25.0000 ug | INTRAMUSCULAR | Status: DC | PRN

## 2021-04-13 MED ORDER — MORPHINE SULFATE (PF) 2 MG/ML IV SOLN
2.0000 mg | INTRAVENOUS | Status: DC | PRN
  Administered 2021-04-13: 2 mg via INTRAVENOUS
  Filled 2021-04-13: qty 1

## 2021-04-13 MED ORDER — LIDOCAINE HCL (CARDIAC) PF 100 MG/5ML IV SOSY
PREFILLED_SYRINGE | INTRAVENOUS | Status: DC | PRN
  Administered 2021-04-13: 80 mg via INTRAVENOUS

## 2021-04-13 MED ORDER — ALPRAZOLAM 0.5 MG PO TABS
0.5000 mg | ORAL_TABLET | Freq: Every day | ORAL | Status: DC
  Administered 2021-04-13 – 2021-04-14 (×2): 0.5 mg via ORAL
  Filled 2021-04-13 (×2): qty 1

## 2021-04-13 MED ORDER — ARTIFICIAL TEARS OPHTHALMIC OINT
TOPICAL_OINTMENT | OPHTHALMIC | Status: AC
  Filled 2021-04-13: qty 3.5

## 2021-04-13 MED ORDER — ONDANSETRON HCL 4 MG/2ML IJ SOLN: 4.0000 mg | Freq: Four times a day (QID) | INTRAMUSCULAR | Status: DC | PRN

## 2021-04-13 MED ORDER — AMLODIPINE BESYLATE 10 MG PO TABS
10.0000 mg | ORAL_TABLET | Freq: Every day | ORAL | Status: DC
  Administered 2021-04-14 – 2021-04-15 (×2): 10 mg via ORAL
  Filled 2021-04-13 (×2): qty 1

## 2021-04-13 MED ORDER — HEPARIN SODIUM (PORCINE) 1000 UNIT/ML IJ SOLN
INTRAMUSCULAR | Status: DC | PRN
  Administered 2021-04-13: 2000 [IU] via INTRAVENOUS
  Administered 2021-04-13: 7000 [IU] via INTRAVENOUS
  Administered 2021-04-13 (×2): 2000 [IU] via INTRAVENOUS
  Administered 2021-04-13: 3000 [IU] via INTRAVENOUS

## 2021-04-13 MED ORDER — MAGNESIUM SULFATE 2 GM/50ML IV SOLN
2.0000 g | Freq: Every day | INTRAVENOUS | Status: DC | PRN
Start: 2021-04-13 — End: 2021-04-15

## 2021-04-13 MED ORDER — DOCUSATE SODIUM 100 MG PO CAPS
100.0000 mg | ORAL_CAPSULE | Freq: Every day | ORAL | Status: DC
  Administered 2021-04-14 – 2021-04-15 (×2): 100 mg via ORAL
  Filled 2021-04-13 (×2): qty 1

## 2021-04-13 MED ORDER — ROSUVASTATIN CALCIUM 5 MG PO TABS
10.0000 mg | ORAL_TABLET | Freq: Every day | ORAL | Status: DC
  Administered 2021-04-14 – 2021-04-15 (×2): 10 mg via ORAL
  Filled 2021-04-13 (×2): qty 2

## 2021-04-13 MED ORDER — ALBUMIN HUMAN 5 % IV SOLN
12.5000 g | Freq: Once | INTRAVENOUS | Status: AC
  Administered 2021-04-13: 12.5 g via INTRAVENOUS

## 2021-04-13 MED ORDER — CEFAZOLIN SODIUM 1 G IJ SOLR
INTRAMUSCULAR | Status: AC
  Filled 2021-04-13: qty 20

## 2021-04-13 MED ORDER — PHENYLEPHRINE HCL (PRESSORS) 10 MG/ML IV SOLN
INTRAVENOUS | Status: DC | PRN
  Administered 2021-04-13: 200 ug via INTRAVENOUS
  Administered 2021-04-13: 120 ug via INTRAVENOUS
  Administered 2021-04-13: 200 ug via INTRAVENOUS
  Administered 2021-04-13: 40 ug via INTRAVENOUS
  Administered 2021-04-13: 200 ug via INTRAVENOUS

## 2021-04-13 MED ORDER — CHLORHEXIDINE GLUCONATE 0.12 % MT SOLN
15.0000 mL | Freq: Once | OROMUCOSAL | Status: AC
  Administered 2021-04-13: 15 mL via OROMUCOSAL
  Filled 2021-04-13: qty 15

## 2021-04-13 MED ORDER — CLOPIDOGREL BISULFATE 75 MG PO TABS
75.0000 mg | ORAL_TABLET | Freq: Every day | ORAL | Status: DC
  Administered 2021-04-14 – 2021-04-15 (×2): 75 mg via ORAL
  Filled 2021-04-13 (×2): qty 1

## 2021-04-13 MED ORDER — ROCURONIUM BROMIDE 10 MG/ML (PF) SYRINGE
PREFILLED_SYRINGE | INTRAVENOUS | Status: DC | PRN
  Administered 2021-04-13: 10 mg via INTRAVENOUS
  Administered 2021-04-13: 20 mg via INTRAVENOUS
  Administered 2021-04-13: 50 mg via INTRAVENOUS
  Administered 2021-04-13 (×2): 20 mg via INTRAVENOUS

## 2021-04-13 MED ORDER — SODIUM CHLORIDE 0.9 % IV SOLN: INTRAVENOUS | Status: DC

## 2021-04-13 MED ORDER — PHENYLEPHRINE HCL-NACL 20-0.9 MG/250ML-% IV SOLN
INTRAVENOUS | Status: DC | PRN
  Administered 2021-04-13: 70 ug/min via INTRAVENOUS

## 2021-04-13 MED ORDER — OXYCODONE-ACETAMINOPHEN 5-325 MG PO TABS
1.0000 | ORAL_TABLET | ORAL | Status: DC | PRN
  Administered 2021-04-13 – 2021-04-15 (×5): 1 via ORAL
  Filled 2021-04-13 (×5): qty 1

## 2021-04-13 MED ORDER — PANTOPRAZOLE SODIUM 40 MG PO TBEC
40.0000 mg | DELAYED_RELEASE_TABLET | Freq: Every day | ORAL | Status: DC
  Administered 2021-04-14 – 2021-04-15 (×2): 40 mg via ORAL
  Filled 2021-04-13 (×2): qty 1

## 2021-04-13 MED ORDER — POTASSIUM CHLORIDE CRYS ER 20 MEQ PO TBCR: 20.0000 meq | EXTENDED_RELEASE_TABLET | Freq: Every day | ORAL | Status: DC | PRN

## 2021-04-13 MED ORDER — HYDRALAZINE HCL 20 MG/ML IJ SOLN: 5.0000 mg | INTRAMUSCULAR | Status: DC | PRN

## 2021-04-13 MED ORDER — SUCCINYLCHOLINE CHLORIDE 200 MG/10ML IV SOSY
PREFILLED_SYRINGE | INTRAVENOUS | Status: DC | PRN
  Administered 2021-04-13: 100 mg via INTRAVENOUS

## 2021-04-13 MED ORDER — ACETAMINOPHEN 325 MG PO TABS: 325.0000 mg | ORAL_TABLET | ORAL | Status: DC | PRN

## 2021-04-13 MED ORDER — HEPARIN 6000 UNIT IRRIGATION SOLUTION
Status: DC | PRN
  Administered 2021-04-13: 1

## 2021-04-13 MED ORDER — PROPOFOL 10 MG/ML IV BOLUS
INTRAVENOUS | Status: DC | PRN
  Administered 2021-04-13: 50 mg via INTRAVENOUS
  Administered 2021-04-13: 100 mg via INTRAVENOUS

## 2021-04-13 MED ORDER — ALUM & MAG HYDROXIDE-SIMETH 200-200-20 MG/5ML PO SUSP: 15.0000 mL | ORAL | Status: DC | PRN

## 2021-04-13 MED ORDER — ROCURONIUM BROMIDE 10 MG/ML (PF) SYRINGE
PREFILLED_SYRINGE | INTRAVENOUS | Status: AC
  Filled 2021-04-13: qty 10

## 2021-04-13 MED ORDER — LACTATED RINGERS IV SOLN: INTRAVENOUS | Status: DC

## 2021-04-13 MED ORDER — SUCCINYLCHOLINE CHLORIDE 200 MG/10ML IV SOSY
PREFILLED_SYRINGE | INTRAVENOUS | Status: AC
  Filled 2021-04-13: qty 10

## 2021-04-13 MED ORDER — 0.9 % SODIUM CHLORIDE (POUR BTL) OPTIME
TOPICAL | Status: DC | PRN
  Administered 2021-04-13: 1000 mL

## 2021-04-13 MED ORDER — FENTANYL CITRATE (PF) 100 MCG/2ML IJ SOLN
INTRAMUSCULAR | Status: DC | PRN
  Administered 2021-04-13: 50 ug via INTRAVENOUS
  Administered 2021-04-13 (×3): 100 ug via INTRAVENOUS

## 2021-04-13 MED ORDER — METOPROLOL SUCCINATE ER 50 MG PO TB24
50.0000 mg | ORAL_TABLET | Freq: Every day | ORAL | Status: DC
  Administered 2021-04-14 – 2021-04-15 (×2): 50 mg via ORAL
  Filled 2021-04-13 (×2): qty 1

## 2021-04-13 MED ORDER — LIDOCAINE HCL (PF) 2 % IJ SOLN
INTRAMUSCULAR | Status: AC
  Filled 2021-04-13: qty 5

## 2021-04-13 MED ORDER — HEPARIN SODIUM (PORCINE) 5000 UNIT/ML IJ SOLN
5000.0000 [IU] | Freq: Three times a day (TID) | INTRAMUSCULAR | Status: DC
  Administered 2021-04-14 – 2021-04-15 (×4): 5000 [IU] via SUBCUTANEOUS
  Filled 2021-04-13 (×4): qty 1

## 2021-04-13 MED ORDER — ONDANSETRON HCL 4 MG/2ML IJ SOLN
INTRAMUSCULAR | Status: DC | PRN
  Administered 2021-04-13: 4 mg via INTRAVENOUS

## 2021-04-13 MED ORDER — AMISULPRIDE (ANTIEMETIC) 5 MG/2ML IV SOLN: 10.0000 mg | Freq: Once | INTRAVENOUS | Status: DC | PRN

## 2021-04-13 MED ORDER — LABETALOL HCL 5 MG/ML IV SOLN
INTRAVENOUS | Status: DC | PRN
  Administered 2021-04-13: 10 mg via INTRAVENOUS

## 2021-04-13 MED ORDER — LACTATED RINGERS IV SOLN: INTRAVENOUS | Status: DC | PRN

## 2021-04-13 MED ORDER — ALBUMIN HUMAN 5 % IV SOLN
INTRAVENOUS | Status: AC
  Filled 2021-04-13: qty 250

## 2021-04-13 MED ORDER — PROPOFOL 10 MG/ML IV BOLUS
INTRAVENOUS | Status: AC
  Filled 2021-04-13: qty 20

## 2021-04-13 MED ORDER — HEMOSTATIC AGENTS (NO CHARGE) OPTIME
TOPICAL | Status: DC | PRN
  Administered 2021-04-13: 3 via TOPICAL
  Administered 2021-04-13 (×2): 1 via TOPICAL

## 2021-04-13 MED ORDER — SODIUM CHLORIDE 0.9 % IV SOLN: 500.0000 mL | Freq: Once | INTRAVENOUS | Status: DC | PRN

## 2021-04-13 SURGICAL SUPPLY — 92 items
ADH SKN CLS APL DERMABOND .7 (GAUZE/BANDAGES/DRESSINGS) ×3
ADH SKN CLS LQ APL DERMABOND (GAUZE/BANDAGES/DRESSINGS) ×6
AGENT HMST KT MTR STRL THRMB (HEMOSTASIS) ×3
APL PRP STRL LF DISP 70% ISPRP (MISCELLANEOUS) ×3
BAG BANDED W/RUBBER/TAPE 36X54 (MISCELLANEOUS) ×4 IMPLANT
BAG COUNTER SPONGE SURGICOUNT (BAG) ×4 IMPLANT
BAG EQP BAND 135X91 W/RBR TAPE (MISCELLANEOUS) ×3
BAG SNAP BAND KOVER 36X36 (MISCELLANEOUS) ×1 IMPLANT
BAG SPNG CNTER NS LX DISP (BAG) ×3
BALLN MUSTANG 7.0X40 75 (BALLOONS) ×4
BALLOON MUSTANG 7.0X40 75 (BALLOONS) ×1 IMPLANT
CANISTER SUCT 3000ML PPV (MISCELLANEOUS) ×4 IMPLANT
CATH ANGIO 5F BER2 100CM (CATHETERS) ×2 IMPLANT
CATH ANGIO 5F BER2 65CM (CATHETERS) IMPLANT
CATH OMNI FLUSH .035X70CM (CATHETERS) ×2 IMPLANT
CATH OMNI FLUSH 5F 65CM (CATHETERS) ×1 IMPLANT
CATH QUICKCROSS .018X135CM (MICROCATHETER) ×1 IMPLANT
CATH QUICKCROSS .035X135CM (MICROCATHETER) ×1 IMPLANT
CHLORAPREP W/TINT 26 (MISCELLANEOUS) ×4 IMPLANT
CLIP VESOCCLUDE MED 24/CT (CLIP) ×4 IMPLANT
CLIP VESOCCLUDE SM WIDE 24/CT (CLIP) ×4 IMPLANT
CLOSURE PERCLOSE PROSTYLE (VASCULAR PRODUCTS) ×8 IMPLANT
COVER DOME SNAP 22 D (MISCELLANEOUS) ×4 IMPLANT
COVER PROBE W GEL 5X96 (DRAPES) ×4 IMPLANT
DERMABOND ADHESIVE PROPEN (GAUZE/BANDAGES/DRESSINGS) ×2
DERMABOND ADVANCED (GAUZE/BANDAGES/DRESSINGS) ×1
DERMABOND ADVANCED .7 DNX12 (GAUZE/BANDAGES/DRESSINGS) ×3 IMPLANT
DERMABOND ADVANCED .7 DNX6 (GAUZE/BANDAGES/DRESSINGS) IMPLANT
DEVICE TORQUE KENDALL .025-038 (MISCELLANEOUS) ×1 IMPLANT
DRYSEAL FLEXSHEATH 12FR 33CM (SHEATH) ×1
ELECT REM PT RETURN 9FT ADLT (ELECTROSURGICAL) ×4
ELECTRODE REM PT RTRN 9FT ADLT (ELECTROSURGICAL) ×3 IMPLANT
EVACUATOR SILICONE 100CC (DRAIN) IMPLANT
GAUZE 4X4 16PLY ~~LOC~~+RFID DBL (SPONGE) ×6 IMPLANT
GLIDEWIRE ADV .035X260CM (WIRE) ×2 IMPLANT
GLOVE SRG 8 PF TXTR STRL LF DI (GLOVE) ×6 IMPLANT
GLOVE SURG MICRO LTX SZ6 (GLOVE) ×3 IMPLANT
GLOVE SURG POLY ORTHO LF SZ7.5 (GLOVE) ×1 IMPLANT
GLOVE SURG POLYISO LF SZ8 (GLOVE) IMPLANT
GLOVE SURG UNDER LTX SZ8 (GLOVE) ×1 IMPLANT
GLOVE SURG UNDER POLY LF SZ8 (GLOVE) ×8
GOWN STRL REUS W/ TWL LRG LVL3 (GOWN DISPOSABLE) ×9 IMPLANT
GOWN STRL REUS W/TWL 2XL LVL3 (GOWN DISPOSABLE) ×4 IMPLANT
GOWN STRL REUS W/TWL LRG LVL3 (GOWN DISPOSABLE) ×12
GUIDEWIRE STR TIP .014X300X8 (WIRE) ×1 IMPLANT
HEMOSTAT SNOW SURGICEL 2X4 (HEMOSTASIS) ×8 IMPLANT
INSERT FOGARTY SM (MISCELLANEOUS) ×1 IMPLANT
KIT BASIN OR (CUSTOM PROCEDURE TRAY) ×4 IMPLANT
KIT ENCORE 26 ADVANTAGE (KITS) ×6 IMPLANT
KIT MICROPUNCTURE NIT STIFF (SHEATH) ×2 IMPLANT
KIT TURNOVER KIT B (KITS) ×4 IMPLANT
NDL PERC 18GX7CM (NEEDLE) ×2 IMPLANT
NEEDLE PERC 18GX7CM (NEEDLE) IMPLANT
NS IRRIG 1000ML POUR BTL (IV SOLUTION) ×8 IMPLANT
PACK PERIPHERAL VASCULAR (CUSTOM PROCEDURE TRAY) ×4 IMPLANT
PACK SURGICAL SETUP 50X90 (CUSTOM PROCEDURE TRAY) ×4 IMPLANT
PAD ARMBOARD 7.5X6 YLW CONV (MISCELLANEOUS) ×8 IMPLANT
PATCH HEMASHIELD 8X75 (Vascular Products) ×1 IMPLANT
PROTECTION STATION PRESSURIZED (MISCELLANEOUS)
SET MICROPUNCTURE 5F STIFF (MISCELLANEOUS) ×4 IMPLANT
SHEATH AVANTI 11CM 5FR (SHEATH) ×4 IMPLANT
SHEATH BRITE TIP 8FR 23CM (SHEATH) ×1 IMPLANT
SHEATH DRYSEAL FLEX 12FR 33CM (SHEATH) ×1 IMPLANT
SHEATH HIGHFLEX ANSEL 6FRX55 (SHEATH) ×3 IMPLANT
SHEATH PINNACLE 6F 10CM (SHEATH) ×4 IMPLANT
SHEATH PINNACLE ST 6F 45CM (SHEATH) ×1 IMPLANT
SPONGE T-LAP 18X18 ~~LOC~~+RFID (SPONGE) ×2 IMPLANT
STATION PROTECTION PRESSURIZED (MISCELLANEOUS) ×3 IMPLANT
STENT VIABAHN 8X50X120 (Permanent Stent) ×4 IMPLANT
STENT VIABAHN VBX 9X59X135 (Permanent Stent) ×1 IMPLANT
STENT VIABAHN5X120X8FR 8X (Permanent Stent) ×1 IMPLANT
STENT VIABAHNBX 9X39X135 (Permanent Stent) ×1 IMPLANT
STOPCOCK MORSE 400PSI 3WAY (MISCELLANEOUS) ×2 IMPLANT
SURGIFLO W/THROMBIN 8M KIT (HEMOSTASIS) ×2 IMPLANT
SUT MNCRL AB 4-0 PS2 18 (SUTURE) ×4 IMPLANT
SUT PROLENE 5 0 C 1 24 (SUTURE) ×12 IMPLANT
SUT PROLENE 6 0 BV (SUTURE) ×8 IMPLANT
SUT VIC AB 2-0 CT1 27 (SUTURE) ×4
SUT VIC AB 2-0 CT1 TAPERPNT 27 (SUTURE) ×3 IMPLANT
SUT VIC AB 3-0 SH 27 (SUTURE) ×8
SUT VIC AB 3-0 SH 27X BRD (SUTURE) ×4 IMPLANT
SYR 10ML LL (SYRINGE) ×12 IMPLANT
SYR 20ML LL LF (SYRINGE) ×4 IMPLANT
SYR MEDRAD MARK V 150ML (SYRINGE) ×2 IMPLANT
TOWEL GREEN STERILE (TOWEL DISPOSABLE) ×8 IMPLANT
TRAY FOLEY MTR SLVR 16FR STAT (SET/KITS/TRAYS/PACK) ×1 IMPLANT
TUBING HIGH PRESSURE 120CM (CONNECTOR) IMPLANT
UNDERPAD 30X36 HEAVY ABSORB (UNDERPADS AND DIAPERS) ×4 IMPLANT
WATER STERILE IRR 1000ML POUR (IV SOLUTION) ×4 IMPLANT
WIRE AMPLATZ SSTIFF .035X260CM (WIRE) ×1 IMPLANT
WIRE BENTSON .035X145CM (WIRE) ×4 IMPLANT
WIRE G V18X300CM (WIRE) ×1 IMPLANT

## 2021-04-13 NOTE — H&P (Signed)
Hospital H&P  Patient seen in preop holding for right common femoral artery, left lower extremity angioplasty, bilateral iliac stenting.  Patient doing well.  No complaints.  Did not go to the beach - did not want to risk getting COVID prior to OR. Has continued DAPT Bilateral groins soft, normal sinus rhythm, no rales crackles  Nonpalpable pulse in bilateral lower extremities Slightly improved claudication in the left after external iliac artery stenting 2 weeks ago  Overall, patient with no changes in medical history since seen last.  After discussing the risks and benefits of the seizure outlined above, Phinneas elected to proceed.  Patient will be admitted under me.   MRN #:  [id number]  History of Present Illness: This is a 75 y.o. male with a 40-year history of peripheral arterial disease left femoral to above-knee popliteal artery bypass in 1993, who presents today with severe lifestyle limiting claudication affecting him after only 1 block.  Per [name], he is unable to live an active lifestyle and has become nearly sedentary due to the bilateral lower extremity pain. Bliss denies wrist pain, wounds on his feet.  He is a non-smoker since 17.  Previous duplex ultrasonography demonstrates monophasic signals throughout the bilateral lower extremities. Medications include aspirin, Plavix, statin.  Past Medical History:  Diagnosis Date   Adenomatous colon polyp 03/2009   Last colonoscopy by Dr. [name]    Adenomatous polyp 2010   Adenomatous polyp of colon 11/03/2010   Arthritis    Barrett's esophagus    CAD (coronary artery disease)    Complication of anesthesia    has a shortened esophogus due to CA.    COPD (chronic obstructive pulmonary disease) (HCC)    Severe emphysema per CT   Diverticulosis    Emphysema of lung (HCC)    Esophageal carcinoma ([place]) 03/2009   T1N1M0   GERD (gastroesophageal reflux disease)    Glaucoma    History of Doppler ultrasound 11/09/2011    03/2014- 50-69% L ICA stenosis;carotid doppler; L bulb/prox ICA 0-49% diameter reduction; L vertebral artery - occlusive ds; L ECA  demonstrates severe amount of fibrous plaque   History of Doppler ultrasound 11/09/2011   LEAs; R ABI - mod art. insuff.; L ABI normal at rest; R SFA - occlusive ds; L SFA - occlusive ds; patent fem-pop graft   History of echocardiogram 08/27/2009   EF >55%   History of hiatal hernia    History of kidney stones 1965   History of nuclear stress test 11/24/2011   lexiscan; normal perfusion; low risk scan; non-diagnostic for ischemia   Hyperlipidemia    Hypertension    Left carotid artery stenosis 04/08/2014   Pneumonia    Pre-diabetes    Pulmonary nodule, right 04/08/2014   2.8 mm-incidental finding on CT   PVD (peripheral vascular disease) ([place])    Tachyarrhythmia 1999   Status post ablation at [facility]   Tobacco abuse     Past Surgical History:  Procedure Laterality Date   ABDOMINAL AORTOGRAM W/LOWER EXTREMITY N/A 04/01/2021   Procedure: ABDOMINAL AORTOGRAM W/LOWER EXTREMITY;  Surgeon: [name], MD;  Location: [place] [facility];  Service: Cardiovascular;  Laterality: N/A;   BIOPSY  10/24/2017   Procedure: BIOPSY;  Surgeon: [name], MD;  [facility];  Service: Endoscopy;;  esophagus   CATARACT EXTRACTION Bilateral    COLONOSCOPY  03/17/2009   Dr.Rourk- normal rectum, sigmoid diverticula, some pale sigmoid mucosa with diffuse petechiae. pedunculated polyp at the splenic  flexure, remainder of colonic mucosa appeared normal. bx= adenomatous polyp   COLONOSCOPY  04/19/2003   Dr.Rehman- few diverticu;a at the sigmoid colon, 3 small polyps, one at the transverse colon and 2 at the sigmoid, small external hemorrhoids. bx report not available.    COLONOSCOPY WITH PROPOFOL N/A 10/24/2017   Dr. [name]: Diverticulosis, 11 mm polyp at the ileocecal valve, tubular adenoma.  Repeat colonoscopy in 3 years   CORONARY ARTERY BYPASS GRAFT  1998    [name]   ESOPHAGECTOMY  2010   [facility] Dr. [name]   ESOPHAGOGASTRODUODENOSCOPY  11/25/2010   Dr.Rourk- s/p esophagectomy with gastric pull-up, esophageal erosions straddling the surgical anastomosis, salmon colored epithelium coming up a good centimeter to a centimeter and a half above the suture line, islands of salmon colored epithelium in the most poximal residual esophagus, remainder of gastric mucosa appeared normal. bx= swamous &gastric glandular mucosa w/chronic active inflammation   ESOPHAGOGASTRODUODENOSCOPY  03/17/2009   Dr.Rourk- 4cm segment of salmon-colored epithlium distal esophagus suspicious for barretts esophagus. area of suspicious nodularity w/in this segment bx seperately. small to moderate size hiatal hernia, o/w normal stomach D1 and D2 bx=adenocarcinoma   ESOPHAGOGASTRODUODENOSCOPY (EGD) WITH PROPOFOL N/A 10/24/2017   Dr. [name]: Remnant of esophagus with anastomosis with stomach at 24 cm from the incisors, 2 cm above the anastomosis he was found to have Barrett's but no dysplasia.  Advised for repeat EGD in 3 years   EYE SURGERY     cataract   FEMORAL-POPLITEAL BYPASS GRAFT  1993   occlusive ds in R SFA   GASTRIC PULL THROUGH  2010   With esophagectomy   JOINT REPLACEMENT     LIPOMA EXCISION  2010   PERIPHERAL VASCULAR INTERVENTION  04/01/2021   Procedure: PERIPHERAL VASCULAR INTERVENTION;  Surgeon: [name], MD;  Location: [place] [facility];  Service: Cardiovascular;;   POLYPECTOMY  10/24/2017   Procedure: POLYPECTOMY;  Surgeon: [name], MD;  Location: [facility];  Service: Endoscopy;;  colon    TOTAL HIP ARTHROPLASTY Left 01/30/2019   Procedure: TOTAL HIP ARTHROPLASTY ANTERIOR APPROACH;  Surgeon: Renette Butters, MD;  Location: [facility];  Service: Orthopedics;  Laterality: Left;    Allergies  Allergen Reactions   Altace [Ramipril] Cough    Prior to Admission medications   Medication Sig Start Date End Date Taking? Authorizing  Provider  acetaminophen (TYLENOL) 500 MG tablet Take 1,000 mg by mouth daily as needed for headache.   Yes [provider]  albuterol (PROVENTIL) (2.5 MG/3ML) 0.083% nebulizer solution Take 3 mLs (2.5 mg total) by nebulization every 6 (six) hours as needed for wheezing or shortness of breath. DX: J44.9 11/05/20  Yes Margaretha Seeds, MD  ALPRAZolam Duanne Moron) 0.5 MG tablet Take 1 tablet (0.5 mg total) by mouth at bedtime. 10/07/14  Yes Troy Sine, MD  amLODipine (NORVASC) 10 MG tablet Take 10 mg by mouth daily.   Yes [provider]  aspirin EC 81 MG tablet Take 1 tablet (81 mg total) by mouth daily. 01/02/21  Yes Jennye Boroughs, MD  baclofen (LIORESAL) 10 MG tablet Take 1 tablet (10 mg total) by mouth 3 (three) times daily as needed for muscle spasms. 01/30/19  Yes Prudencio Burly III, PA-C  clopidogrel (PLAVIX) 75 MG tablet Take 1 tablet (75 mg total) by mouth daily. TAKE 1 TABLET BY MOUTH EVERY DAY 01/02/21  Yes Jennye Boroughs, MD  dexlansoprazole (DEXILANT) 60 MG capsule Take 1 capsule (60 mg total) by  mouth as needed (takes 2-3 times per week). 01/02/21  Yes Jennye Boroughs, MD  Glycopyrrolate-Formoterol (BEVESPI AEROSPHERE) 9-4.8 MCG/ACT AERO Inhale 2 puffs into the lungs 2 (two) times daily. Patient taking differently: Inhale 2 puffs into the lungs 2 (two) times daily as needed (shortness of breath). 11/05/20  Yes Margaretha Seeds, MD  guaiFENesin (MUCINEX) 600 MG 12 hr tablet Take 600 mg by mouth every 12 (twelve) hours as needed (cough).   Yes [provider]  latanoprost (XALATAN) 0.005 % ophthalmic solution Place 1 drop into both eyes at bedtime.  01/10/13  Yes [provider]  losartan (COZAAR) 100 MG tablet TAKE ONE TABLET BY MOUTH EVERY NIGHT AT BEDTIME 02/11/21  Yes Troy Sine, MD  metoprolol succinate (TOPROL-XL) 50 MG 24 hr tablet TAKE ONE (1) TABLET BY MOUTH EVERY DAY 01/27/21  Yes Troy Sine, MD  montelukast (SINGULAIR) 10 MG tablet  Take 1 tablet (10 mg total) by mouth daily. 11/05/20  Yes Margaretha Seeds, MD  Multiple Vitamins-Minerals (CENTRUM SILVER PO) Take 1 tablet by mouth daily.   Yes [provider]  nitroGLYCERIN (NITROSTAT) 0.4 MG SL tablet Place 1 tablet (0.4 mg total) under the tongue every 5 (five) minutes as needed for chest pain. 02/10/21  Yes Troy Sine, MD  rosuvastatin (CRESTOR) 10 MG tablet TAKE ONE (1) TABLET BY MOUTH EVERY DAY 02/19/21  Yes [name], PA    Social History   Socioeconomic History   Marital status: Married    Spouse name: Not on file   Number of children: 2   Years of education: Not on file   Highest education level: Not on file  Occupational History   Occupation: retired    Fish farm manager: RETIRED    Comment: truck driver  Tobacco Use   Smoking status: Former    Packs/day: 2.00    Years: 40.00    Pack years: 80.00    Types: Cigarettes    Start date: 1957    Quit date: 07/20/1995    Years since quitting: 25.7   Smokeless tobacco: Never  Vaping Use   Vaping Use: Never used  Substance and Sexual Activity   Alcohol use: No    Alcohol/week: 0.0 standard drinks   Drug use: No   Sexual activity: Yes    Partners: Female    Birth control/protection: Condom    Comment: friend  Other Topics Concern   Not on file  Social History Narrative   Not on file   Social Determinants of Health   Financial Resource Strain: Not on file  Food Insecurity: Not on file  Transportation Needs: Not on file  Physical Activity: Not on file  Stress: Not on file  Social Connections: Not on file  Intimate Partner Violence: Not on file     Family History  Problem Relation Age of Onset   GER disease Mother    Coronary artery disease Brother    Congenital heart disease Sister    Colon cancer Neg Hx     ROS: Otherwise negative unless mentioned in HPI  Physical Examination  Vitals:   04/13/21 0624  BP: (!) 165/61  Pulse: 77  Resp: 17  Temp: 97.9 F (36.6 C)  SpO2: 96%    Body mass index is 23.57 kg/m.  General:  WDWN in NAD Gait: Not observed HENT: WNL, normocephalic Pulmonary: normal non-labored breathing, without Rales, rhonchi,  wheezing Cardiac: regular, without  Murmurs, rubs or gallops; Abdomen:  soft, NT/ND, no masses Skin:  without rashes Vascular Exam/Pulses:  1+ femoral bilaterally No pulses in the feet, no wounds Extremities: without ischemic changes, without Gangrene , without cellulitis; without open wounds;  Musculoskeletal: no muscle wasting or atrophy  Neurologic: A&O X 3;  No focal weakness or paresthesias are detected; speech is fluent/normal Psychiatric:  The pt has Normal affect. Lymph:  Unremarkable  CBC    Component Value Date/Time   WBC 6.6 04/09/2021 1002   RBC 5.00 04/09/2021 1002   HGB 15.3 04/09/2021 1002   HCT 47.1 04/09/2021 1002   PLT 298 04/09/2021 1002   MCV 94.2 04/09/2021 1002   MCH 30.6 04/09/2021 1002   MCHC 32.5 04/09/2021 1002   RDW 13.1 04/09/2021 1002   LYMPHSABS 1.2 01/01/2021 1405   MONOABS 0.9 01/01/2021 1405   EOSABS 0.1 01/01/2021 1405   BASOSABS 0.1 01/01/2021 1405    BMET    Component Value Date/Time   NA 142 04/09/2021 1002   K 4.4 04/09/2021 1002   CL 109 04/09/2021 1002   CO2 23 04/09/2021 1002   GLUCOSE 81 04/09/2021 1002   BUN 15 04/09/2021 1002   CREATININE 0.97 04/09/2021 1002   CREATININE 0.79 01/31/2013 0940   CALCIUM 9.4 04/09/2021 1002   GFRNONAA >60 04/09/2021 1002   GFRAA >60 01/22/2019 1430    COAGS: Lab Results  Component Value Date   INR 1.1 04/09/2021   INR 1.0 12/25/2020     Non-Invasive Vascular Imaging:   Monophasic waveforms throughout the bilateral lower extremities  Statin:  Yes.   Beta Blocker:  Yes.   Aspirin:  Yes.   ACEI:  No. ARB:  No. CCB use:  No Other antiplatelets/anticoagulants:  Yes.   Plavix   ASSESSMENT/PLAN: This is a 75 y.o. male presenting to Cath Lab today with lifestyle limiting claudication that has become debilitating.   Patient now status post angiogram, left external iliac artery stenting.  OR planned for right common femoral endarterectomy, left previous bypass distal anastomosis plasty, bilateral common iliac artery stenting.   Cassandria Santee MD MS [facility] [phone] 04/13/2021  7:16 AM

## 2021-04-13 NOTE — Anesthesia Procedure Notes (Signed)
Procedure Name: Intubation Date/Time: 04/13/2021 7:59 AM Performed by: Cathren Harsh, CRNA Pre-anesthesia Checklist: Patient identified, Emergency Drugs available, Suction available and Patient being monitored Patient Re-evaluated:Patient Re-evaluated prior to induction Oxygen Delivery Method: Circle System Utilized Preoxygenation: Pre-oxygenation with 100% oxygen Induction Type: IV induction Ventilation: Mask ventilation without difficulty Laryngoscope Size: Mac and 4 Tube type: Oral Tube size: 7.5 mm Number of attempts: 1 Airway Equipment and Method: Stylet and Oral airway Placement Confirmation: ETT inserted through vocal cords under direct vision, positive ETCO2 and breath sounds checked- equal and bilateral Secured at: 23 cm Tube secured with: Tape Dental Injury: Teeth and Oropharynx as per pre-operative assessment

## 2021-04-13 NOTE — Anesthesia Postprocedure Evaluation (Signed)
Anesthesia Post Note  Patient: Gregory Eaton  Procedure(s) Performed: RIGHT ILIOFEMORAL ARTERY ENDARTERECTOMY WITH PATCH ANGIOPLASTY USING HEMASHIELD PALTINUM FINESSE DACRON PATCH (Right: Groin) LEFT LOWER EXTREMITY ANGIOGRAM  (Left: Groin) INSERTION OF BILATERAL COMMON ILIAC KISSING STENTS AND INSERION OF RIGHT EXTERNAL ILIAC STENT (Bilateral: Groin) ULTRASOUND GUIDANCE FOR VASCULAR ACCESS, left femoral (Left: Groin)     Patient location during evaluation: PACU Anesthesia Type: General Level of consciousness: awake and alert Pain management: pain level controlled Vital Signs Assessment: post-procedure vital signs reviewed and stable Respiratory status: spontaneous breathing, nonlabored ventilation, respiratory function stable and patient connected to nasal cannula oxygen Cardiovascular status: blood pressure returned to baseline and stable Postop Assessment: no apparent nausea or vomiting Anesthetic complications: no   No notable events documented.  Last Vitals:  Vitals:   04/13/21 1520 04/13/21 1542  BP: (!) 120/53 (!) 130/50  Pulse: 77 78  Resp: 13 13  Temp: (!) 36.3 C (!) 36.3 C  SpO2: 98% 99%    Last Pain:  Vitals:   04/13/21 1542  TempSrc: Oral  PainSc:                  Tiajuana Amass

## 2021-04-13 NOTE — Anesthesia Procedure Notes (Signed)
Arterial Line Insertion Start/End9/26/2022 7:09 AM, 04/13/2021 7:15 AM Performed by: Suzette Battiest, MD, Cathren Harsh, CRNA, CRNA  Patient location: Pre-op. Preanesthetic checklist: patient identified, IV checked, site marked, risks and benefits discussed, surgical consent, monitors and equipment checked, pre-op evaluation, timeout performed and anesthesia consent Lidocaine 1% used for infiltration radial was placed Catheter size: 20 G Hand hygiene performed , maximum sterile barriers used  and Seldinger technique used Allen's test indicative of satisfactory collateral circulation Attempts: 1 Procedure performed using ultrasound guided technique. Ultrasound Notes:anatomy identified, needle tip was noted to be adjacent to the nerve/plexus identified and no ultrasound evidence of intravascular and/or intraneural injection Following insertion, dressing applied. Post procedure assessment: normal and unchanged  Patient tolerated the procedure well with no immediate complications.

## 2021-04-13 NOTE — Op Note (Signed)
NAME: Gregory Eaton    MRN: [id number] DOB: 03/20/1946    DATE OF OPERATION: 04/13/2021  PREOP DIAGNOSIS:    Bilateral lower extremity lifestyle limiting claudication, right greater than left  POSTOP DIAGNOSIS:    Same  PROCEDURE:    Right iliofemoral endarterectomy Aortogram  bilateral common iliac artery covered stents,  9 x 57m VBX right, 9 x 347mVBX left,  Right external iliac artery covered stent, 8 x 5 cm Viabahn Left lower extremity angiogram   SURGEON: JoBroadus JohnASSIST: MaArlee MuslimANESTHESIA: General  EBL: [id number]:    ChaANGAD Eaton a 75 28o. male with history of peripheral vascular disease, previous left-sided femoral to below-knee popliteal artery bypass using greater saphenous vein (1993), who presented in the outpatient setting with bilateral lower extremity lifestyle and claudication right greater than left.  Patient was initially taken to the Cath Lab with angiography demonstrating critical stenosis of the distal anastomosis of his left-sided femoral to below-knee popliteal artery, as well as significant inflow disease involving the terminal aorta, bilateral iliac arteries, right common femoral artery.  At that time, he underwent right sided external iliac artery stenting.  After discussing the risks and benefits of right-sided iliofemoral endarterectomy, bilateral common iliac artery stenting, angioplasty of the distal anastomosis of his devious bypass, [name] to proceed.  FINDINGS:   Severe atherosclerotic disease of the bilateral iliac arteries extending into the common femoral arteries.  Critical stenosis of 99% at the distal aspect of his left-sided [name] to below-knee popliteal artery bypass.  This was not amenable to endovascular repair.  TECHNIQUE:   Patient was brought to the OR and laid in the supine position.  General anesthesia was induced and the patient was prepped and draped in standard fashion.  Procedure  began with left-sided oblique incision over the common femoral artery.  The common femoral artery was identified and found to be heavily calcified.  Therefore, the distal external iliac artery, SFA, profunda, were exposed and controlled for ilio femoral endarterectomy.  The patient was heparinized with 5000 units IV heparin and was kept over an ACT greater than 250 for the remainder of the case.  Longitudinal arteriotomy was made on the common femoral artery, extending beyond to the inferior epigastric artery.  This was followed by iliofemoral endarterectomy.  The superficial femoral artery was known to be occluded.  The eversion technique was used to leave a small opening for possible endovascular recannulization down the road, should the patient need it.  The profunda femoris was heavily calcified, and was also endarterectomized.  There was excellent backbleeding at completion.  A dacron patch was brought onto the field and sewn in standard fashion using 5-0 running Prolene suture.  Once completed, we moved to the endovascular portion of the case.  The dacryon patch was accessed using a micropuncture needle under direct visualization.  This was upsized to 6 Fre[place]Aortogram followed followed by diagnostic angiography of the left lower extremity.  Aortogram demonstrated severe atherosclerotic disease extending from the terminal aorta through the iliac system.  There was slow flow in the bypass with greater than 99% stenosis distally.  Several attempts were made to traverse a 6 x 45 cm sheath from the right-sided common femoral artery access into the left iliac artery.  This was met with significant difficulty.  Several modalities were attempted including contralateral access and snaring the wire -  Access of the left common femoral artery was obtained  under ultrasound guidance using a micropuncture needle.  Ultimately, I was able to get a wire through the distal anastomosis of the bypass however no catheter  would advance due to the significant amount of potential energy buildup in the system.  The decision was made to abandon this endovascular approach and offer the patient open bypass revision another day.  The right-sided common femoral artery access sheath was dilated to a 12 [place] Gore dry seal.  This proved difficult and required a crack and paved technique using an 8 x 5 cm Gore Viabahn in the right external iliac artery.  (The 12 French dry seal was chosen in case the iliac bifurcation crack during stent placement, and allowed for a Coda balloon dilation.)  The left-sided access was dilated 8 [place].  Diagnostic angiogram followed through the right sided 12 French sheath.  Next, a 9 x 59 (right), 9 x 39 (left) VBX balloons were brought onto the field and deployed in standard fashion, during a breath-hold, with special attention to land at the aortic bifurcation. Completion angiogram demonstrated excellent result.  Residual stenosis appreciated the previous stenting site in the right external iliac artery.  The 9 mm wide balloon was used in the previously placed Gore Viabahn to further dilate it.  2 Pro-glide's were used to manage the access in the left lower common femoral artery.  These were semi-successful.  Completion angiogram demonstrated no stenoses, however there was bleeding, and therefore pressure was used.  On the right, the 4 [place] dry seal was removed from the diaphragm patch with arteriotomy closed using 6-0 Prolene suture.  The patient's heparin was reversed using protamine.  The wounds were irrigated and hemostasis achieved using thrombin product and cautery.  The left-sided common femoral artery, oblique incision, was closed with the use of Vicryl in layers, with Monocryl at the skin.  At case completion, the patient had new posterior tibial signal in the right foot.  There were no signals in the left foot preop.  Next, our attention turned to bilateral iliac arteries.  Given the  complexity of the case a first assistant was necessary in order to expedient the procedure and safely perform the technical aspects of the operation.  Macie Burows, MD [facility]  DATE OF DICTATION:   04/13/2021  Plan: Patient admitted to me Will need 2-week follow-up for wound check and to discuss possible left leg open bypass revision Will continue aspirin Plavix ABIs will be ordered in house

## 2021-04-13 NOTE — Transfer of Care (Signed)
Immediate Anesthesia Transfer of Care Note  Patient: Gregory Eaton  Procedure(s) Performed: RIGHT ILIOFEMORAL ARTERY ENDARTERECTOMY WITH PATCH ANGIOPLASTY USING HEMASHIELD PALTINUM FINESSE DACRON PATCH (Right: Groin) LEFT LOWER EXTREMITY ANGIOGRAM  (Left: Groin) INSERTION OF BILATERAL COMMON ILIAC KISSING STENTS AND INSERION OF RIGHT EXTERNAL ILIAC STENT (Bilateral: Groin) ULTRASOUND GUIDANCE FOR VASCULAR ACCESS, left femoral (Left: Groin)  Patient Location: PACU  Anesthesia Type:General  Level of Consciousness: drowsy, patient cooperative and responds to stimulation  Airway & Oxygen Therapy: Patient Spontanous Breathing and Patient connected to nasal cannula oxygen  Post-op Assessment: Report given to RN and Post -op Vital signs reviewed and stable  Post vital signs: Reviewed and stable  Last Vitals:  Vitals Value Taken Time  BP 100/45 04/13/21 1405  Temp    Pulse 64 04/13/21 1408  Resp 14 04/13/21 1408  SpO2 95 % 04/13/21 1408  Vitals shown include unvalidated device data.  Last Pain:  Vitals:   04/13/21 0624  TempSrc: Oral  PainSc:          Complications: No notable events documented.

## 2021-04-14 ENCOUNTER — Encounter (HOSPITAL_COMMUNITY): Payer: Self-pay | Admitting: Vascular Surgery

## 2021-04-14 ENCOUNTER — Encounter (HOSPITAL_COMMUNITY): Payer: Medicare Other

## 2021-04-14 LAB — CBC
HCT: 30.2 % — ABNORMAL LOW (ref 39.0–52.0)
Hemoglobin: 10 g/dL — ABNORMAL LOW (ref 13.0–17.0)
MCH: 30.7 pg (ref 26.0–34.0)
MCHC: 33.1 g/dL (ref 30.0–36.0)
MCV: 92.6 fL (ref 80.0–100.0)
Platelets: 232 10*3/uL (ref 150–400)
RBC: 3.26 MIL/uL — ABNORMAL LOW (ref 4.22–5.81)
RDW: 13.2 % (ref 11.5–15.5)
WBC: 13.5 10*3/uL — ABNORMAL HIGH (ref 4.0–10.5)
nRBC: 0 % (ref 0.0–0.2)

## 2021-04-14 LAB — BASIC METABOLIC PANEL
Anion gap: 8 (ref 5–15)
BUN: 12 mg/dL (ref 8–23)
CO2: 23 mmol/L (ref 22–32)
Calcium: 8.2 mg/dL — ABNORMAL LOW (ref 8.9–10.3)
Chloride: 106 mmol/L (ref 98–111)
Creatinine, Ser: 0.78 mg/dL (ref 0.61–1.24)
GFR, Estimated: >60 mL/min (ref >60)
Glucose, Bld: 135 mg/dL — ABNORMAL HIGH (ref 70–99)
Potassium: 3.9 mmol/L (ref 3.5–5.1)
Sodium: 137 mmol/L (ref 135–145)

## 2021-04-14 LAB — GLUCOSE, CAPILLARY
Glucose-Capillary: 130 mg/dL — ABNORMAL HIGH (ref 70–99)
Glucose-Capillary: 140 mg/dL — ABNORMAL HIGH (ref 70–99)

## 2021-04-14 MED ORDER — PHENYLEPHRINE HCL-NACL 20-0.9 MG/250ML-% IV SOLN
INTRAVENOUS | Status: AC
  Filled 2021-04-14: qty 500

## 2021-04-14 NOTE — Progress Notes (Signed)
Mobility Specialist Progress Note    04/14/21 1330  Mobility  Activity Ambulated in [place]  Level of Assistance Independent  Assistive Device None  Distance Ambulated (ft) 900 ft  Mobility Ambulated independently in hallway  Mobility Response Tolerated well  Mobility performed by Mobility specialist  $Mobility charge 1 Mobility   Pre-Mobility: 107 HR During Mobility: 106 HR Post-Mobility: 91 HR, 163/51 BP  Pt found at sink about to walk. C/o some soreness at groin site and leg muscles feeling sore from lack of use. Took one seated break at turn around point. Returned to room with call bell in reach and wife present.  Hildred Alamin Mobility Specialist  Mobility Specialist Phone: [phone]

## 2021-04-14 NOTE — Progress Notes (Addendum)
  Progress Note    04/14/2021 7:45 AM 1 Day Post-Op  Subjective:  No complaints overnight; denies rest pain in BLE   Vitals:   04/14/21 0326 04/14/21 0537  BP: (!) 90/49 (!) 100/56  Pulse: 82 95  Resp: 20 19  Temp: 98.6 F (37 C)   SpO2: 91% (!) 86%   Physical Exam: Cardiac:  RRR Lungs:  non labored  Incisions:  R groin incision c/d/I; L groin cath site without hematoma Extremities:  L ATA soft by doppler; brisk R PT by doppler Abdomen:  soft Neurologic: a&O  CBC    Component Value Date/Time   WBC 13.5 (H) 04/14/2021 0340   RBC 3.26 (L) 04/14/2021 0340   HGB 10.0 (L) 04/14/2021 0340   HCT 30.2 (L) 04/14/2021 0340   PLT 232 04/14/2021 0340   MCV 92.6 04/14/2021 0340   MCH 30.7 04/14/2021 0340   MCHC 33.1 04/14/2021 0340   RDW 13.2 04/14/2021 0340   LYMPHSABS 1.2 01/01/2021 1405   MONOABS 0.9 01/01/2021 1405   EOSABS 0.1 01/01/2021 1405   BASOSABS 0.1 01/01/2021 1405    BMET    Component Value Date/Time   NA 137 04/14/2021 0340   K 3.9 04/14/2021 0340   CL 106 04/14/2021 0340   CO2 23 04/14/2021 0340   GLUCOSE 135 (H) 04/14/2021 0340   BUN 12 04/14/2021 0340   CREATININE 0.78 04/14/2021 0340   CREATININE 0.79 01/31/2013 0940   CALCIUM 8.2 (L) 04/14/2021 0340   GFRNONAA >60 04/14/2021 0340   GFRAA >60 01/22/2019 1430    INR    Component Value Date/Time   INR 1.1 04/09/2021 1002     Intake/Output Summary (Last 24 hours) at 04/14/2021 0745 Last data filed at 04/14/2021 1914 Gross per 24 hour  Intake 2740 ml  Output 3275 ml  Net -535 ml     Assessment/Plan:  75 y.o. male is s/p R iliofemoral endarterectomy and B CIA stents 1 Day Post-Op   BLE well perfused based on doppler exam R groin incision unremarkable; L groin cath site without hematoma Continue aspirin, plavix, and statin Mobilize today with therapy teams; probably home tomorrow  Dagoberto Ligas, PA-C [facility] [phone] 04/14/2021 7:45 AM  VASCULAR STAFF  ADDENDUM:  I have independently interviewed and examined the patient. I agree with the above.  OOB today, regular diet, continue ASA/ Plavix Home tomorrow pending PT/OT clearance and continued pain control   Cassandria Santee, MD [facility] Phone Number: [phone] 04/14/2021 8:15 AM

## 2021-04-14 NOTE — Evaluation (Signed)
Occupational Therapy Evaluation/Discharge Patient Details Name: Gregory Eaton MRN: [id number] DOB: 26-Oct-1945 Today's Date: 04/14/2021   History of Present Illness Pt is a 75 y.o. male who presented with caludication of B LE, as well as arterial stenosis causing lifestyle limiting pain. Pt underwentright-sided iliofemoral endarterectomy, bilateral common iliac artery stenting, angioplasty of the distal anastomosis of his devious bypass on 9/26. PMH: PAD, COPD, emphysema, glaucoma, GERD, CABG, HTN   Clinical Impression   PTA, pt lives with spouse and reports Independence with ADLs, IADLs and mobility though has been limited by progressive pain. Pt presents now with improved pain levels and able to demo ADLs Independently. Pt initially min guard via handheld assist for initial mobility in room though progressed to no physical assistance needed. BP WFL throughout session. Pt left up in chair with needs within reach. Anticipate no further skilled OT services needed at acute level or at DC. OT to sign off.       Recommendations for follow up therapy are one component of a multi-disciplinary discharge planning process, led by the attending physician.  Recommendations may be updated based on patient status, additional functional criteria and insurance authorization.   Follow Up Recommendations  No OT follow up    Equipment Recommendations  None recommended by OT    Recommendations for Other Services       Precautions / Restrictions Precautions Precautions: Fall Precaution Comments: relatively low fall risk Restrictions Weight Bearing Restrictions: No      Mobility Bed Mobility Overal bed mobility: Independent             General bed mobility comments: to/from flat bed for PA to assess pulses    Transfers Overall transfer level: Independent Equipment used: None                  Balance Overall balance assessment: Needs assistance Sitting-balance support: No upper  extremity supported;Feet supported Sitting balance-Leahy Scale: Good     Standing balance support: Single extremity supported;During functional activity Standing balance-Leahy Scale: Good Standing balance comment: progressing to no AD for short mobility                           ADL either performed or assessed with clinical judgement   ADL Overall ADL's : Modified independent                                       General ADL Comments: able to reach B feet without much difficulty, ambulate in room with light handheld assist initially progressing to no assist, able to complete various ADLs standing at sink without increased pain or safety concerns     Vision Baseline Vision/History: 1 Wears glasses;3 Glaucoma Ability to See in Adequate Light: 0 Adequate Patient Visual Report: No change from baseline Vision Assessment?: No apparent visual deficits     Perception     Praxis      Pertinent Vitals/Pain Pain Assessment: Faces Faces Pain Scale: Hurts a little bit Pain Location: groin Pain Descriptors / Indicators: Sore Pain Intervention(s): Monitored during session     Hand Dominance Right   Extremity/Trunk Assessment Upper Extremity Assessment Upper Extremity Assessment: Overall WFL for tasks assessed   Lower Extremity Assessment Lower Extremity Assessment: Defer to PT evaluation   Cervical / Trunk Assessment Cervical / Trunk Assessment: Normal   Communication Communication Communication: No difficulties  Cognition Arousal/Alertness: Awake/alert Behavior During Therapy: WFL for tasks assessed/performed Overall Cognitive Status: Within Functional Limits for tasks assessed                                     General Comments  BP WFL, denies dizziness with movement    Exercises     Shoulder Instructions      Home Living Family/patient expects to be discharged to:: Private residence Living Arrangements:  Spouse/significant other Available Help at Discharge: Family;Available 24 hours/day Type of Home: House Home Access: Ramped entrance     Home Layout: One level     Bathroom Shower/Tub: Occupational psychologist: Handicapped height     Home Equipment: Environmental consultant - 2 wheels;Cane - single point;Shower seat - built in;Grab bars - tub/shower;Hand held shower head          Prior Functioning/Environment Level of Independence: Independent        Comments: no use of AD though pain limiting activities and required frequent rest breaks. Pt enjoys gardening, working outside and staying active        OT Problem List:        OT Treatment/Interventions:      OT Goals(Current goals can be found in the care plan section) Acute Rehab OT Goals Patient Stated Goal: go home, heal and take a trip to beach OT Goal Formulation: All assessment and education complete, DC therapy  OT Frequency:     Barriers to D/C:            Co-evaluation              AM-PAC OT "6 Clicks" Daily Activity     Outcome Measure Help from another person eating meals?: None Help from another person taking care of personal grooming?: None Help from another person toileting, which includes using toliet, bedpan, or urinal?: None Help from another person bathing (including washing, rinsing, drying)?: None Help from another person to put on and taking off regular upper body clothing?: None Help from another person to put on and taking off regular lower body clothing?: None 6 Click Score: 24   End of Session Equipment Utilized During Treatment: Gait belt Nurse Communication: Mobility status  Activity Tolerance: Patient tolerated treatment well Patient left: in chair;with call bell/phone within reach  OT Visit Diagnosis: Other abnormalities of gait and mobility (R26.89)                Time: 9562-1308 OT Time Calculation (min): 21 min Charges:  OT General Charges $OT Visit: 1 Visit OT  Evaluation $OT Eval Low Complexity: 1 Low  Malachy Chamber, OTR/L Acute Rehab Services Office: [phone]   Layla Maw 04/14/2021, 7:41 AM

## 2021-04-14 NOTE — Progress Notes (Signed)
Patient diaphoric V.S.S. check sugar 140. No complaints voiced.

## 2021-04-14 NOTE — Evaluation (Signed)
Physical Therapy Evaluation Patient Details Name: Gregory Eaton MRN: [id number] DOB: 21-Jun-1946 Today's Date: 04/14/2021  History of Present Illness  Pt is a 75 y.o. male who presented with claudication of B LE, as well as arterial stenosis causing lifestyle limiting pain. s/p right-sided iliofemoral endarterectomy, bilateral common iliac artery stenting, angioplasty of the distal anastomosis of his devious bypass on 9/26. PMH: PAD, COPD, emphysema, glaucoma, GERD, CABG, HTN  Clinical Impression  Patient presents with post surgical pain in groin s/p above and chronic back pain. Tolerated transfers and gait training Mod I without use of DME. No evidence of imbalance or dizziness. Reports being active at home and independent for ADLs/IADLs PTA except recently limited due to pain in BLEs. Encouraged walking in room and hallway daily to maintain mobility/strength while in the hospital. Pt does not require skilled therapy services as pt functioning close to baseline at Mod I-independent level. All education completed. Discharge from therapy.     Recommendations for follow up therapy are one component of a multi-disciplinary discharge planning process, led by the attending physician.  Recommendations may be updated based on patient status, additional functional criteria and insurance authorization.  Follow Up Recommendations No PT follow up    Equipment Recommendations  None recommended by PT    Recommendations for Other Services       Precautions / Restrictions Precautions Precautions: None Precaution Comments: relatively low fall risk Restrictions Weight Bearing Restrictions: No      Mobility  Bed Mobility Overal bed mobility: Independent             General bed mobility comments: Up in chair upon PT arrival.    Transfers Overall transfer level: Independent Equipment used: None             General transfer comment: Stood from chair x1 without  difficulty  Ambulation/Gait Ambulation/Gait assistance: Independent Social research officer, government (Feet): 450 Feet Assistive device: None Gait Pattern/deviations: Step-through pattern;Decreased stride length   Gait velocity interpretation: 1.31 - 2.62 ft/sec, indicative of limited community ambulator General Gait Details: Steady gait with no evidence of imbalance even with turns.  Stairs            Wheelchair Mobility    Modified Rankin (Stroke Patients Only)       Balance Overall balance assessment: No apparent balance deficits (not formally assessed) Sitting-balance support: No upper extremity supported;Feet supported Sitting balance-Leahy Scale: Good     Standing balance support: Single extremity supported;During functional activity Standing balance-Leahy Scale: Good Standing balance comment: progressing to no AD for short mobility                             Pertinent Vitals/Pain Pain Assessment: 0-10 Pain Score: 4  Faces Pain Scale: Hurts a little bit Pain Location: groin; feet; back Pain Descriptors / Indicators: Sore;Aching Pain Intervention(s): Monitored during session    Home Living Family/patient expects to be discharged to:: Private residence Living Arrangements: Spouse/significant other Available Help at Discharge: Family;Available 24 hours/day Type of Home: House Home Access: Ramped entrance     Home Layout: One level Home Equipment: Walker - 2 wheels;Cane - single point;Shower seat - built in;Grab bars - tub/shower;Hand held shower head      Prior Function Level of Independence: Independent         Comments: no use of AD though pain limiting activities and required frequent rest breaks. Pt enjoys gardening, working outside and staying active  Hand Dominance   Dominant Hand: Right    Extremity/Trunk Assessment   Upper Extremity Assessment Upper Extremity Assessment: Defer to OT evaluation    Lower Extremity Assessment Lower  Extremity Assessment: Overall WFL for tasks assessed    Cervical / Trunk Assessment Cervical / Trunk Assessment: Normal  Communication   Communication: No difficulties  Cognition Arousal/Alertness: Awake/alert Behavior During Therapy: WFL for tasks assessed/performed Overall Cognitive Status: Within Functional Limits for tasks assessed                                        General Comments General comments (skin integrity, edema, etc.): VSS on RA.    Exercises     Assessment/Plan    PT Assessment Patent does not need any further PT services  PT Problem List         PT Treatment Interventions      PT Goals (Current goals can be found in the Care Plan section)  Acute Rehab PT Goals Patient Stated Goal: go home, heal and take a trip to beach PT Goal Formulation: All assessment and education complete, DC therapy    Frequency     Barriers to discharge        Co-evaluation               AM-PAC PT "6 Clicks" Mobility  Outcome Measure Help needed turning from your back to your side while in a flat bed without using bedrails?: None Help needed moving from lying on your back to sitting on the side of a flat bed without using bedrails?: None Help needed moving to and from a bed to a chair (including a wheelchair)?: None Help needed standing up from a chair using your arms (e.g., wheelchair or bedside chair)?: None Help needed to walk in hospital room?: None Help needed climbing 3-5 steps with a railing? : A Little 6 Click Score: 23    End of Session Equipment Utilized During Treatment: Gait belt Activity Tolerance: Patient tolerated treatment well Patient left: in chair;with call bell/phone within reach Nurse Communication: Mobility status;Other (comment) (walk to bathroom independently once d/ced from tele) PT Visit Diagnosis: Pain Pain - Right/Left: Right Pain - part of body:  (groin, back, feet)    Time: 6468-0321 PT Time Calculation (min)  (ACUTE ONLY): 15 min   Charges:   PT Evaluation $PT Eval Moderate Complexity: 1 Mod          Marisa Severin, PT, DPT Acute Rehabilitation Services Pager [phone] Office [phone]     Marguarite Arbour A Sabra Heck 04/14/2021, 9:17 AM

## 2021-04-15 LAB — CBC
HCT: 31.9 % — ABNORMAL LOW (ref 39.0–52.0)
Hemoglobin: 10.6 g/dL — ABNORMAL LOW (ref 13.0–17.0)
MCH: 31 pg (ref 26.0–34.0)
MCHC: 33.2 g/dL (ref 30.0–36.0)
MCV: 93.3 fL (ref 80.0–100.0)
Platelets: 227 10*3/uL (ref 150–400)
RBC: 3.42 MIL/uL — ABNORMAL LOW (ref 4.22–5.81)
RDW: 13.3 % (ref 11.5–15.5)
WBC: 12.9 10*3/uL — ABNORMAL HIGH (ref 4.0–10.5)
nRBC: 0 % (ref 0.0–0.2)

## 2021-04-15 LAB — BASIC METABOLIC PANEL
Anion gap: 6 (ref 5–15)
BUN: 11 mg/dL (ref 8–23)
CO2: 26 mmol/L (ref 22–32)
Calcium: 8.7 mg/dL — ABNORMAL LOW (ref 8.9–10.3)
Chloride: 105 mmol/L (ref 98–111)
Creatinine, Ser: 0.8 mg/dL (ref 0.61–1.24)
GFR, Estimated: >60 mL/min (ref >60)
Glucose, Bld: 141 mg/dL — ABNORMAL HIGH (ref 70–99)
Potassium: 4.5 mmol/L (ref 3.5–5.1)
Sodium: 137 mmol/L (ref 135–145)

## 2021-04-15 MED ORDER — OXYCODONE-ACETAMINOPHEN 5-325 MG PO TABS: 1.0000 | ORAL_TABLET | Freq: Four times a day (QID) | ORAL | 0 refills | Status: DC | PRN

## 2021-04-15 NOTE — Discharge Summary (Signed)
Discharge Summary  Patient ID: Gregory Eaton [id number] 75 y.o. Jan 27, 1946  Admit date: 04/13/2021  Discharge date and time: 04/15/2021 11:36 AM   Admitting Physician: [name], MD   Discharge Physician: same  Admission Diagnoses: PAD (peripheral artery disease) ([place]) [I73.9]  Discharge Diagnoses: same  Admission Condition: fair  Discharged Condition: fair  Indication for Admission: Bilateral lower extremity lifestyle limiting claudication, right greater than left  Hospital Course: [name] is a 75 year old male experiencing bilateral lower extremity lifestyle limiting claudication, right greater than left leg.  After angiography at a prior date, he was brought to the operating room by Dr. Virl Eaton and underwent right iliofemoral endarterectomy with bilateral common iliac artery stents and right external iliac artery stenting on 04/13/2021.  Intraoperatively outflow stenosis of left lower extremity bypass was unable to be addressed endovascularly.  He tolerated the procedure well and was admitted to the hospital postoperatively.  POD #1 patient had dopplerable signals in bilateral lower extremities.  He did experience a 5-6 point drop in hemoglobin however this proved to be asymptomatic throughout his hospital stay.  On POD #2 patient was ready for discharge home.  Throughout his hospital stay right groin incision remained unremarkable.  Left groin catheterization site also remained on remarkable.  At the time of discharge he was ambulating the halls without difficulty and was without rest pain of bilateral lower extremities.  He will continue his aspirin and Plavix daily.  He was prescribed 2 to 3 days of narcotic pain medication for continued postoperative pain control.  He will follow-up with Dr. Virl Eaton in office in about 2 weeks for incision check as well as to discuss surgical revision of outflow stenosis of left lower extremity bypass graft.  Discharge instructions were  reviewed with the patient and he voiced his understanding.  He was discharged home in stable condition.  Consults: None  Treatments: surgery: Right iliofemoral endarterectomy with bilateral common iliac artery stenting and right external iliac artery stenting by Dr. Virl Eaton on 04/13/2021  Discharge Exam: See progress note 04/15/21 Vitals:   04/15/21 0353 04/15/21 0758  BP: (!) 122/48 (!) 110/44  Pulse: 85 73  Resp: 18 10  Temp: 98.3 F (36.8 C) 98 F (36.7 C)  SpO2: 94% 92%     Disposition: Discharge disposition: 01-Home or Self Care       Patient Instructions:  Allergies as of 04/15/2021       Reactions   Altace [ramipril] Cough        Medication List     TAKE these medications    acetaminophen 500 MG tablet Commonly known as: TYLENOL Take 1,000 mg by mouth daily as needed for headache.   albuterol (2.5 MG/3ML) 0.083% nebulizer solution Commonly known as: PROVENTIL Take 3 mLs (2.5 mg total) by nebulization every 6 (six) hours as needed for wheezing or shortness of breath. DX: J44.9   ALPRAZolam 0.5 MG tablet Commonly known as: XANAX Take 1 tablet (0.5 mg total) by mouth at bedtime.   amLODipine 10 MG tablet Commonly known as: NORVASC Take 10 mg by mouth daily.   aspirin EC 81 MG tablet Take 1 tablet (81 mg total) by mouth daily.   baclofen 10 MG tablet Commonly known as: LIORESAL Take 1 tablet (10 mg total) by mouth 3 (three) times daily as needed for muscle spasms.   Bevespi Aerosphere 9-4.8 MCG/ACT Aero Generic drug: Glycopyrrolate-Formoterol Inhale 2 puffs into the lungs 2 (two) times daily. What changed:  when to take  this reasons to take this   CENTRUM SILVER PO Take 1 tablet by mouth daily.   clopidogrel 75 MG tablet Commonly known as: PLAVIX Take 1 tablet (75 mg total) by mouth daily. TAKE 1 TABLET BY MOUTH EVERY DAY   dexlansoprazole 60 MG capsule Commonly known as: Dexilant Take 1 capsule (60 mg total) by mouth as needed (takes 2-3  times per week).   guaiFENesin 600 MG 12 hr tablet Commonly known as: MUCINEX Take 600 mg by mouth every 12 (twelve) hours as needed (cough).   latanoprost 0.005 % ophthalmic solution Commonly known as: XALATAN Place 1 drop into both eyes at bedtime.   losartan 100 MG tablet Commonly known as: COZAAR TAKE ONE TABLET BY MOUTH EVERY NIGHT AT BEDTIME   metoprolol succinate 50 MG 24 hr tablet Commonly known as: TOPROL-XL TAKE ONE (1) TABLET BY MOUTH EVERY DAY   montelukast 10 MG tablet Commonly known as: SINGULAIR Take 1 tablet (10 mg total) by mouth daily.   nitroGLYCERIN 0.4 MG SL tablet Commonly known as: Nitrostat Place 1 tablet (0.4 mg total) under the tongue every 5 (five) minutes as needed for chest pain.   oxyCODONE-acetaminophen 5-325 MG tablet Commonly known as: PERCOCET/ROXICET Take 1 tablet by mouth every 6 (six) hours as needed for moderate pain.   rosuvastatin 10 MG tablet Commonly known as: CRESTOR TAKE ONE (1) TABLET BY MOUTH EVERY DAY       Activity: activity as tolerated Diet: regular diet Wound Care: keep wound clean and dry  Follow-up with [facility] in 2 weeks.  Signed: Dagoberto Ligas, PA-C 04/15/2021 3:32 PM [facility] Office: [phone]

## 2021-04-15 NOTE — Discharge Instructions (Signed)
 [facility] [place]  Discharge instructions  Lower Extremity Bypass Surgery  Please refer to the following instruction for your post-procedure care. Your surgeon or physician assistant will discuss any changes with you.  Activity  You are encouraged to walk as much as you can. You can slowly return to normal activities during the month after your surgery. Avoid strenuous activity and heavy lifting until your doctor tells you it's OK. Avoid activities such as vacuuming or swinging a golf club. Do not drive until your doctor give the OK and you are no longer taking prescription pain medications. It is also normal to have difficulty with sleep habits, eating and bowel movement after surgery. These will go away with time.  Bathing/Showering  You may shower after you go home. Do not soak in a bathtub, hot tub, or swim until the incision heals completely.  Incision Care  Clean your incision with mild soap and water. Shower every day. Pat the area dry with a clean towel. You do not need a bandage unless otherwise instructed. Do not apply any ointments or creams to your incision. If you have open wounds you will be instructed how to care for them or a visiting nurse may be arranged for you. If you have staples or sutures along your incision they will be removed at your post-op appointment. You may have skin glue on your incision. Do not peel it off. It will come off on its own in about one week. If you have a great deal of moisture in your groin, use a gauze help keep this area dry.  Diet  Resume your normal diet. There are no special food restrictions following this procedure. A low fat/ low cholesterol diet is recommended for all patients with vascular disease. In order to heal from your surgery, it is CRITICAL to get adequate nutrition. Your body requires vitamins, minerals, and protein. Vegetables are the best source of vitamins and minerals. Vegetables also provide the  perfect balance of protein. Processed food has little nutritional value, so try to avoid this.  Medications  Resume taking all your medications unless your doctor or nurse practitioner tells you not to. If your incision is causing pain, you may take over-the-counter pain relievers such as acetaminophen (Tylenol). If you were prescribed a stronger pain medication, please aware these medication can cause nausea and constipation. Prevent nausea by taking the medication with a snack or meal. Avoid constipation by drinking plenty of fluids and eating foods with high amount of fiber, such as fruits, vegetables, and grains. Take Colase 100 mg (an over-the-counter stool softener) twice a day as needed for constipation. Do not take Tylenol if you are taking prescription pain medications.  Follow Up  Our office will schedule a follow up appointment 2-3 weeks following discharge.  Please call us immediately for any of the following conditions  Severe or worsening pain in your legs or feet while at rest or while walking Increase pain, redness, warmth, or drainage (pus) from your incision site(s) Fever of 101 degree or higher The swelling in your leg with the bypass suddenly worsens and becomes more painful than when you were in the hospital If you have been instructed to feel your graft pulse then you should do so every day. If you can no longer feel this pulse, call the office immediately. Not all patients are given this instruction.  Leg swelling is common after leg bypass surgery.  The swelling should improve over a few months   following surgery. To improve the swelling, you may elevate your legs above the level of your heart while you are sitting or resting. Your surgeon or physician assistant may ask you to apply an ACE wrap or wear compression (TED) stockings to help to reduce swelling.  Reduce your risk of vascular disease  Stop smoking. If you would like help call [facility] at 1-800-QUIT-NOW  (1-800-784-8669) or [facility] at 336-586-4000.  Manage your cholesterol Maintain a desired weight Control your diabetes weight Control your diabetes Keep your blood pressure down  If you have any questions, please call the office at [phone]   

## 2021-04-15 NOTE — Progress Notes (Signed)
D/C instructions given to patient. Wound care and medications reviewed, All questions answered. IV x2 removed. Wife to escort pt home.  Clyde Canterbury, RN

## 2021-04-15 NOTE — Progress Notes (Addendum)
  Progress Note    04/15/2021 7:43 AM 2 Days Post-Op  Subjective:  wants to go home.  Ambulating without pain   Vitals:   04/14/21 2319 04/15/21 0353  BP: (!) 130/50 (!) 122/48  Pulse: 98 85  Resp: 20 18  Temp: 98.3 F (36.8 C) 98.3 F (36.8 C)  SpO2: 92% 94%   Physical Exam: Lungs:  non labored Incisions:  R groin incision c/d/i Extremities:  feet warm and well perfused; palpable femoral pulses; soft L ATA by doppler; R PT by doppler Neurologic: A&O  CBC    Component Value Date/Time   WBC 13.5 (H) 04/14/2021 0340   RBC 3.26 (L) 04/14/2021 0340   HGB 10.0 (L) 04/14/2021 0340   HCT 30.2 (L) 04/14/2021 0340   PLT 232 04/14/2021 0340   MCV 92.6 04/14/2021 0340   MCH 30.7 04/14/2021 0340   MCHC 33.1 04/14/2021 0340   RDW 13.2 04/14/2021 0340   LYMPHSABS 1.2 01/01/2021 1405   MONOABS 0.9 01/01/2021 1405   EOSABS 0.1 01/01/2021 1405   BASOSABS 0.1 01/01/2021 1405    BMET    Component Value Date/Time   NA 137 04/14/2021 0340   K 3.9 04/14/2021 0340   CL 106 04/14/2021 0340   CO2 23 04/14/2021 0340   GLUCOSE 135 (H) 04/14/2021 0340   BUN 12 04/14/2021 0340   CREATININE 0.78 04/14/2021 0340   CREATININE 0.79 01/31/2013 0940   CALCIUM 8.2 (L) 04/14/2021 0340   GFRNONAA >60 04/14/2021 0340   GFRAA >60 01/22/2019 1430    INR    Component Value Date/Time   INR 1.1 04/09/2021 1002     Intake/Output Summary (Last 24 hours) at 04/15/2021 0743 Last data filed at 04/15/2021 0550 Gross per 24 hour  Intake 1200 ml  Output 1350 ml  Net -150 ml     Assessment/Plan:  75 y.o. male is s/p R iliofemoral endarterectomy with B CIA stents 2 Days Post-Op   BLE warm and well perfused on exam R groin incision unremarkable Continue asa, plavix, statin Discharge home today; follow up in 2-3 weeks with Dr. Virl Cagey to discuss revision of distal L fem-pop/outflow  Dagoberto Ligas, PA-C [facility] [phone] 04/15/2021 7:43 AM  VASCULAR STAFF  ADDENDUM: I have independently interviewed and examined the patient. I agree with the above.  Pt doing well this morning. No complaints, healing well.  Home today  Cassandria Santee, MD [facility] Phone Number: [phone] 04/15/2021 1:00 PM

## 2021-04-16 ENCOUNTER — Telehealth: Payer: Self-pay | Admitting: Gastroenterology

## 2021-04-16 NOTE — Telephone Encounter (Signed)
Pt was made aware and verbalized understanding.  

## 2021-04-16 NOTE — Telephone Encounter (Signed)
Lmom for pt to return my call.  

## 2021-04-16 NOTE — Telephone Encounter (Signed)
Please let pt know that I was following up on his recent vascular procedure. Since has has new stents and requires uninterrupted plavix, Dr. [name] is recommending having his postpone EGD/colonoscopy for now. We would like to have him come back in 07/2021 and move towards procedures at that time.   Please schedule ov for 07/2021.  Please tell patient to call us if he has any issues in the interim.

## 2021-04-17 ENCOUNTER — Other Ambulatory Visit: Payer: Self-pay

## 2021-04-17 ENCOUNTER — Ambulatory Visit (HOSPITAL_COMMUNITY): Payer: Medicare Other

## 2021-04-17 DIAGNOSIS — I739 Peripheral vascular disease, unspecified: Secondary | ICD-10-CM

## 2021-04-17 NOTE — Addendum Note (Signed)
Addended byDoylene Bode on: 04/17/2021 03:27 PM   Modules accepted: Orders

## 2021-04-21 ENCOUNTER — Encounter: Payer: Self-pay | Admitting: Internal Medicine

## 2021-05-01 ENCOUNTER — Other Ambulatory Visit: Payer: Self-pay

## 2021-05-01 ENCOUNTER — Ambulatory Visit (HOSPITAL_COMMUNITY)
Admission: RE | Admit: 2021-05-01 | Discharge: 2021-05-01 | Disposition: A | Discharge status: Home / Self Care | Payer: Medicare Other | Source: Ambulatory Visit | Attending: Vascular Surgery | Admitting: Vascular Surgery

## 2021-05-01 ENCOUNTER — Encounter: Payer: Self-pay | Admitting: Vascular Surgery

## 2021-05-01 ENCOUNTER — Ambulatory Visit (INDEPENDENT_AMBULATORY_CARE_PROVIDER_SITE_OTHER): Payer: Medicare Other | Admitting: Vascular Surgery

## 2021-05-01 ENCOUNTER — Other Ambulatory Visit: Payer: Self-pay | Admitting: Cardiovascular Disease

## 2021-05-01 VITALS — BP 130/67 | HR 74 | Temp 98.2°F | Resp 16 | Ht 68.0 in | Wt 153.0 lb

## 2021-05-01 DIAGNOSIS — I739 Peripheral vascular disease, unspecified: Secondary | ICD-10-CM | POA: Insufficient documentation

## 2021-05-01 NOTE — Progress Notes (Addendum)
Office Note     CC: Wound check, left lower extremity pain Requesting Provider:  Curly Rim, MD  HPI: Gregory Eaton is a 75 y.o. (Apr 10, 1946) male with history of bilateral lower extremity lifestyle limiting claudication, right greater than left.  The patient most recently underwent right iliofemoral endarterectomy, bilateral common iliac artery stenting, bilateral external iliac artery stenting. Patient also had a previous greater saphenous vein bypass in 1993 with Dr. [name].  This demonstrated critical stenosis at the distal anastomosis, unfortunately was unable to cross the lesion with a wire and therefore the decision was made to surgically revise the bypass once the patient recovered from the surgery listed above.  Patient presents today accompanied by his wife doing well.  He feels as though his right leg has completely healed and feels normal.  The left leg has now become problematic, and is described as severe claudication with intermittent rest pain.  The pt is  on a statin for cholesterol management.  The pt is  on a daily aspirin.   Other AC:  plavix The pt is  on medication for hypertension.   The pt is not diabetic.  Tobacco hx:  -  Past Medical History:  Diagnosis Date   Adenomatous colon polyp 03/2009   Last colonoscopy by Dr. [name]    Adenomatous polyp 2010   Adenomatous polyp of colon 11/03/2010   Arthritis    Barrett's esophagus    CAD (coronary artery disease)    Complication of anesthesia    has a shortened esophogus due to CA.    COPD (chronic obstructive pulmonary disease) (HCC)    Severe emphysema per CT   Diverticulosis    Emphysema of lung (HCC)    Esophageal carcinoma ([place]) 03/2009   T1N1M0   GERD (gastroesophageal reflux disease)    Glaucoma    History of Doppler ultrasound 11/09/2011   03/2014- 50-69% L ICA stenosis;carotid doppler; L bulb/prox ICA 0-49% diameter reduction; L vertebral artery - occlusive ds; L ECA  demonstrates severe  amount of fibrous plaque   History of Doppler ultrasound 11/09/2011   LEAs; R ABI - mod art. insuff.; L ABI normal at rest; R SFA - occlusive ds; L SFA - occlusive ds; patent fem-pop graft   History of echocardiogram 08/27/2009   EF >55%   History of hiatal hernia    History of kidney stones 1965   History of nuclear stress test 11/24/2011   lexiscan; normal perfusion; low risk scan; non-diagnostic for ischemia   Hyperlipidemia    Hypertension    Left carotid artery stenosis 04/08/2014   Pneumonia    Pre-diabetes    Pulmonary nodule, right 04/08/2014   2.8 mm-incidental finding on CT   PVD (peripheral vascular disease) ([place])    Tachyarrhythmia 1999   Status post ablation at [facility]   Tobacco abuse     Past Surgical History:  Procedure Laterality Date   ABDOMINAL AORTOGRAM W/LOWER EXTREMITY N/A 04/01/2021   Procedure: ABDOMINAL AORTOGRAM W/LOWER EXTREMITY;  Surgeon: [name], MD;  Location: [facility];  Service: Cardiovascular;  Laterality: N/A;   BIOPSY  10/24/2017   Procedure: BIOPSY;  Surgeon: [name], MD;  [facility];  Service: Endoscopy;;  esophagus   CATARACT EXTRACTION Bilateral    COLONOSCOPY  03/17/2009   Dr.Rourk- normal rectum, sigmoid diverticula, some pale sigmoid mucosa with diffuse petechiae. pedunculated polyp at the splenic flexure, remainder of colonic mucosa appeared normal. bx= adenomatous polyp  COLONOSCOPY  04/19/2003   Dr.Rehman- few diverticu;a at the sigmoid colon, 3 small polyps, one at the transverse colon and 2 at the sigmoid, small external hemorrhoids. bx report not available.    COLONOSCOPY WITH PROPOFOL N/A 10/24/2017   Dr. [name]: Diverticulosis, 11 mm polyp at the ileocecal valve, tubular adenoma.  Repeat colonoscopy in 3 years   CORONARY ARTERY BYPASS GRAFT  1998   [name]   ENDARTERECTOMY FEMORAL Right 04/13/2021   Procedure: RIGHT ILIOFEMORAL ARTERY ENDARTERECTOMY WITH PATCH ANGIOPLASTY USING HEMASHIELD  [place];  Surgeon: [name], MD;  Location: [place];  Service: Vascular;  Laterality: Right;   ESOPHAGECTOMY  2010   [facility] Dr. [name]   ESOPHAGOGASTRODUODENOSCOPY  11/25/2010   Dr.Rourk- s/p esophagectomy with gastric pull-up, esophageal erosions straddling the surgical anastomosis, salmon colored epithelium coming up a good centimeter to a centimeter and a half above the suture line, islands of salmon colored epithelium in the most poximal residual esophagus, remainder of gastric mucosa appeared normal. bx= swamous &gastric glandular mucosa w/chronic active inflammation   ESOPHAGOGASTRODUODENOSCOPY  03/17/2009   Dr.Rourk- 4cm segment of salmon-colored epithlium distal esophagus suspicious for barretts esophagus. area of suspicious nodularity w/in this segment bx seperately. small to moderate size hiatal hernia, o/w normal stomach D1 and D2 bx=adenocarcinoma   ESOPHAGOGASTRODUODENOSCOPY (EGD) WITH PROPOFOL N/A 10/24/2017   Dr. [name]: Remnant of esophagus with anastomosis with stomach at 24 cm from the incisors, 2 cm above the anastomosis he was found to have Barrett's but no dysplasia.  Advised for repeat EGD in 3 years   EYE SURGERY     cataract   FEMORAL-POPLITEAL BYPASS GRAFT  1993   occlusive ds in R SFA   GASTRIC PULL THROUGH  2010   With esophagectomy   INSERTION OF ILIAC STENT Bilateral 04/13/2021   Procedure: INSERTION OF BILATERAL COMMON ILIAC KISSING STENTS AND INSERION OF RIGHT EXTERNAL ILIAC STENT;  Surgeon: [name], MD;  Location: [facility];  Service: Vascular;  Laterality: Bilateral;   JOINT REPLACEMENT     LIPOMA EXCISION  2010   LOWER EXTREMITY ANGIOGRAM Left 04/13/2021   Procedure: LEFT LOWER EXTREMITY ANGIOGRAM ;  Surgeon: [name], MD;  Location: [facility];  Service: Vascular;  Laterality: Left;   PERIPHERAL VASCULAR INTERVENTION  04/01/2021   Procedure: PERIPHERAL VASCULAR INTERVENTION;  Surgeon: [name], MD;  Location: [facility];  Service: Cardiovascular;;   POLYPECTOMY  10/24/2017   Procedure: POLYPECTOMY;  Surgeon: [name], MD;  [facility];  Service: Endoscopy;;  colon    TOTAL HIP ARTHROPLASTY Left 01/30/2019   Procedure: TOTAL HIP ARTHROPLASTY ANTERIOR APPROACH;  Surgeon: Renette Butters, MD;  Location: [facility];  Service: Orthopedics;  Laterality: Left;   ULTRASOUND GUIDANCE FOR VASCULAR ACCESS Left 04/13/2021   Procedure: ULTRASOUND GUIDANCE FOR VASCULAR ACCESS, left femoral;  Surgeon: [name], MD;  Location: [facility];  Service: Vascular;  Laterality: Left;    Social History   Socioeconomic History   Marital status: Married    Spouse name: Not on file   Number of children: 2   Years of education: Not on file   Highest education level: Not on file  Occupational History   Occupation: retired    Fish farm manager: RETIRED    Comment: truck driver  Tobacco Use   Smoking status: Former    Packs/day: 2.00    Years: 40.00    Pack years: 80.00    Types:  Cigarettes    Start date: 33    Quit date: 07/20/1995    Years since quitting: 25.8   Smokeless tobacco: Never  Vaping Use   Vaping Use: Never used  Substance and Sexual Activity   Alcohol use: No    Alcohol/week: 0.0 standard drinks   Drug use: No   Sexual activity: Yes    Partners: Female    Birth control/protection: Condom    Comment: friend  Other Topics Concern   Not on file  Social History Narrative   Not on file   Social Determinants of Health   Financial Resource Strain: Not on file  Food Insecurity: Not on file  Transportation Needs: Not on file  Physical Activity: Not on file  Stress: Not on file  Social Connections: Not on file  Intimate Partner Violence: Not on file    Family History  Problem Relation Age of Onset   GER disease Mother    Coronary artery disease Brother    Congenital heart disease Sister    Colon cancer Neg Hx     Current Outpatient Medications  Medication Sig  Dispense Refill   acetaminophen (TYLENOL) 500 MG tablet Take 1,000 mg by mouth daily as needed for headache.     albuterol (PROVENTIL) (2.5 MG/3ML) 0.083% nebulizer solution Take 3 mLs (2.5 mg total) by nebulization every 6 (six) hours as needed for wheezing or shortness of breath. DX: J44.9 75 mL 6   ALPRAZolam (XANAX) 0.5 MG tablet Take 1 tablet (0.5 mg total) by mouth at bedtime. 30 tablet 0   amLODipine (NORVASC) 10 MG tablet TAKE ONE (1) TABLET BY MOUTH EVERY DAY 90 tablet 3   aspirin EC 81 MG tablet Take 1 tablet (81 mg total) by mouth daily.     baclofen (LIORESAL) 10 MG tablet Take 1 tablet (10 mg total) by mouth 3 (three) times daily as needed for muscle spasms. 20 each 0   clopidogrel (PLAVIX) 75 MG tablet Take 1 tablet (75 mg total) by mouth daily. TAKE 1 TABLET BY MOUTH EVERY DAY     dexlansoprazole (DEXILANT) 60 MG capsule Take 1 capsule (60 mg total) by mouth as needed (takes 2-3 times per week).     Glycopyrrolate-Formoterol (BEVESPI AEROSPHERE) 9-4.8 MCG/ACT AERO Inhale 2 puffs into the lungs 2 (two) times daily. (Patient taking differently: Inhale 2 puffs into the lungs 2 (two) times daily as needed (shortness of breath).) 5.9 g 6   guaiFENesin (MUCINEX) 600 MG 12 hr tablet Take 600 mg by mouth every 12 (twelve) hours as needed (cough).     latanoprost (XALATAN) 0.005 % ophthalmic solution Place 1 drop into both eyes at bedtime.      losartan (COZAAR) 100 MG tablet TAKE ONE TABLET BY MOUTH EVERY NIGHT AT BEDTIME 90 tablet 3   metoprolol succinate (TOPROL-XL) 50 MG 24 hr tablet TAKE ONE (1) TABLET BY MOUTH EVERY DAY 90 tablet 0   montelukast (SINGULAIR) 10 MG tablet Take 1 tablet (10 mg total) by mouth daily. 90 tablet 4   Multiple Vitamins-Minerals (CENTRUM SILVER PO) Take 1 tablet by mouth daily.     nitroGLYCERIN (NITROSTAT) 0.4 MG SL tablet Place 1 tablet (0.4 mg total) under the tongue every 5 (five) minutes as needed for chest pain. 25 tablet 0   rosuvastatin (CRESTOR) 10 MG  tablet TAKE ONE (1) TABLET BY MOUTH EVERY DAY 90 tablet 3   oxyCODONE-acetaminophen (PERCOCET/ROXICET) 5-325 MG tablet Take 1 tablet by mouth every 6 (six) hours as  needed for moderate pain. (Patient not taking: Reported on 05/01/2021) 20 tablet 0   No current facility-administered medications for this visit.    Allergies  Allergen Reactions   Altace [Ramipril] Cough     REVIEW OF SYSTEMS:   [X]  denotes positive finding, [ ]  denotes negative finding Cardiac  Comments:  Chest pain or chest pressure:    Shortness of breath upon exertion:    Short of breath when lying flat:    Irregular heart rhythm:        Vascular    Pain in calf, thigh, or hip brought on by ambulation: X   Pain in feet at night that wakes you up from your sleep:     Blood clot in your veins:    Leg swelling:         Pulmonary    Oxygen at home:    Productive cough:     Wheezing:         Neurologic    Sudden weakness in arms or legs:     Sudden numbness in arms or legs:     Sudden onset of difficulty speaking or slurred speech:    Temporary loss of vision in one eye:     Problems with dizziness:         Gastrointestinal    Blood in stool:     Vomited blood:         Genitourinary    Burning when urinating:     Blood in urine:        Psychiatric    Major depression:         Hematologic    Bleeding problems:    Problems with blood clotting too easily:        Skin    Rashes or ulcers:        Constitutional    Fever or chills:      PHYSICAL EXAMINATION:  Vitals:   05/01/21 1539  BP: 130/67  Pulse: 74  Resp: 16  Temp: 98.2 F (36.8 C)  TempSrc: Temporal  SpO2: 96%  Weight: 153 lb (69.4 kg)  Height: 5\' 8"  (1.727 m)    General:  WDWN in NAD; vital signs documented above Gait: Not observed HENT: WNL, normocephalic Pulmonary: normal non-labored breathing , without Rales, rhonchi,  wheezing Cardiac: regular HR Abdomen: soft, NT, no masses Skin: without rashes Vascular  Exam/Pulses:  Right Left  Radial 2+ (normal) 2+ (normal)  Ulnar 2+ (normal) 2+ (normal)  Femoral 2+ (normal) 2+ (normal)  Popliteal    DP monophasic none  PT Multiphasic none   Extremities:  no ischemic changes, without Gangrene , without cellulitis; without open wounds;  Right groin wound healing well. The patient's left-sided saphenous vein bypass was insonated demonstrating no color-flow concerning for interim thrombosis since imaged in the operating room Musculoskeletal: no muscle wasting or atrophy  Neurologic: A&O X 3;  No focal weakness or paresthesias are detected Psychiatric:  The pt has Normal affect.   Non-Invasive Vascular Imaging:   Today's ABI demonstrated improved perfusion in the patient's right leg, decreased perfusion in the patient's left leg consistent with thrombosed bypass.   ASSESSMENT/PLAN:: 75 y.o. male presenting with improvement in lifestyle limiting claudication in the right leg and worsening claudication/rest pain in the left leg status post bilateral iliac artery, external iliac artery stenting, right iliofemoral endarterectomy.  The patient's left-sided femoral to below-knee popliteal artery bypass was not amenable to endovascular intervention.  I had plans for open surgical  revision in the coming weeks.  Unfortunately, in clinic, I insonated the bypass and there was no color flow.  Gregory Eaton will need a redo femoral to below-knee popliteal artery bypass in the left leg.  Plan will be to perform a redo femoral to popliteal artery bypass using PTFE as both saphenous veins have been used, one for bypass, one for CABG.  I will call the patient with the results from his ultrasound.   [name], MD [facility] [phone]

## 2021-05-04 ENCOUNTER — Encounter (HOSPITAL_COMMUNITY): Payer: Self-pay

## 2021-05-04 ENCOUNTER — Encounter (HOSPITAL_COMMUNITY): Payer: Medicare Other

## 2021-05-04 ENCOUNTER — Ambulatory Visit (HOSPITAL_COMMUNITY)
Admission: RE | Admit: 2021-05-04 | Discharge: 2021-05-04 | Disposition: A | Discharge status: Home / Self Care | Payer: Medicare Other | Source: Ambulatory Visit | Attending: Vascular Surgery | Admitting: Vascular Surgery

## 2021-05-04 ENCOUNTER — Other Ambulatory Visit (HOSPITAL_COMMUNITY): Payer: Medicare Other

## 2021-05-04 ENCOUNTER — Other Ambulatory Visit: Payer: Self-pay

## 2021-05-04 DIAGNOSIS — I739 Peripheral vascular disease, unspecified: Secondary | ICD-10-CM

## 2021-05-04 DIAGNOSIS — T82898A Other specified complication of vascular prosthetic devices, implants and grafts, initial encounter: Secondary | ICD-10-CM

## 2021-05-06 ENCOUNTER — Other Ambulatory Visit: Payer: Self-pay

## 2021-05-06 DIAGNOSIS — T82898A Other specified complication of vascular prosthetic devices, implants and grafts, initial encounter: Secondary | ICD-10-CM

## 2021-05-08 ENCOUNTER — Other Ambulatory Visit: Payer: Self-pay | Admitting: Orthopedic Surgery

## 2021-05-08 ENCOUNTER — Encounter (HOSPITAL_COMMUNITY): Payer: Self-pay | Admitting: Vascular Surgery

## 2021-05-08 ENCOUNTER — Other Ambulatory Visit: Payer: Self-pay

## 2021-05-08 NOTE — Progress Notes (Addendum)
[name] denies chest pain or shortness of breath. Patient denies having any s/s of Covid in her household.  Patient denies any known exposure to Covid.   [name] has pre- diabetes, he does not check CBG.  [name] Drs. PCP is Pablo Lawrence, NP with [facility]. Pulmologist is Dr. Loanne Drilling Cardiologist is Dr. Corky Downs.  [name] denies every having to take NTG.

## 2021-05-08 NOTE — Addendum Note (Signed)
Addended by: Nicholas Lose on: 05/08/2021 10:58 AM   Modules accepted: Orders

## 2021-05-09 LAB — SARS CORONAVIRUS 2 (TAT 6-24 HRS): SARS Coronavirus 2: NEGATIVE

## 2021-05-11 ENCOUNTER — Inpatient Hospital Stay (HOSPITAL_COMMUNITY): Payer: Medicare Other | Admitting: Certified Registered"

## 2021-05-11 ENCOUNTER — Other Ambulatory Visit: Payer: Self-pay

## 2021-05-11 ENCOUNTER — Inpatient Hospital Stay (HOSPITAL_COMMUNITY): Payer: Medicare Other

## 2021-05-11 ENCOUNTER — Inpatient Hospital Stay (HOSPITAL_COMMUNITY)
Admission: RE | Admit: 2021-05-11 | Discharge: 2021-05-13 | DRG: 253 | Disposition: A | Discharge status: Home / Self Care | Payer: Medicare Other | Attending: Vascular Surgery | Admitting: Vascular Surgery

## 2021-05-11 ENCOUNTER — Encounter (HOSPITAL_COMMUNITY)
Admission: RE | Disposition: A | Discharge status: Home / Self Care | Payer: Self-pay | Source: Home / Self Care | Attending: Vascular Surgery

## 2021-05-11 ENCOUNTER — Encounter (HOSPITAL_COMMUNITY): Payer: Self-pay | Admitting: Vascular Surgery

## 2021-05-11 DIAGNOSIS — Z7902 Long term (current) use of antithrombotics/antiplatelets: Secondary | ICD-10-CM

## 2021-05-11 DIAGNOSIS — J439 Emphysema, unspecified: Secondary | ICD-10-CM | POA: Diagnosis present

## 2021-05-11 DIAGNOSIS — I2510 Atherosclerotic heart disease of native coronary artery without angina pectoris: Secondary | ICD-10-CM | POA: Diagnosis present

## 2021-05-11 DIAGNOSIS — R911 Solitary pulmonary nodule: Secondary | ICD-10-CM | POA: Diagnosis present

## 2021-05-11 DIAGNOSIS — H409 Unspecified glaucoma: Secondary | ICD-10-CM | POA: Diagnosis present

## 2021-05-11 DIAGNOSIS — E785 Hyperlipidemia, unspecified: Secondary | ICD-10-CM | POA: Diagnosis present

## 2021-05-11 DIAGNOSIS — I10 Essential (primary) hypertension: Secondary | ICD-10-CM | POA: Diagnosis present

## 2021-05-11 DIAGNOSIS — Z87891 Personal history of nicotine dependence: Secondary | ICD-10-CM

## 2021-05-11 DIAGNOSIS — K2270 Barrett's esophagus without dysplasia: Secondary | ICD-10-CM | POA: Diagnosis present

## 2021-05-11 DIAGNOSIS — Z96642 Presence of left artificial hip joint: Secondary | ICD-10-CM | POA: Diagnosis present

## 2021-05-11 DIAGNOSIS — T82868A Thrombosis of vascular prosthetic devices, implants and grafts, initial encounter: Principal | ICD-10-CM | POA: Diagnosis present

## 2021-05-11 DIAGNOSIS — I70222 Atherosclerosis of native arteries of extremities with rest pain, left leg: Secondary | ICD-10-CM

## 2021-05-11 DIAGNOSIS — Z8249 Family history of ischemic heart disease and other diseases of the circulatory system: Secondary | ICD-10-CM

## 2021-05-11 DIAGNOSIS — T82898A Other specified complication of vascular prosthetic devices, implants and grafts, initial encounter: Secondary | ICD-10-CM

## 2021-05-11 DIAGNOSIS — Z888 Allergy status to other drugs, medicaments and biological substances status: Secondary | ICD-10-CM

## 2021-05-11 DIAGNOSIS — Z95828 Presence of other vascular implants and grafts: Secondary | ICD-10-CM

## 2021-05-11 DIAGNOSIS — Z7982 Long term (current) use of aspirin: Secondary | ICD-10-CM

## 2021-05-11 DIAGNOSIS — R7303 Prediabetes: Secondary | ICD-10-CM | POA: Diagnosis present

## 2021-05-11 DIAGNOSIS — Z951 Presence of aortocoronary bypass graft: Secondary | ICD-10-CM | POA: Diagnosis not present

## 2021-05-11 DIAGNOSIS — M1990 Unspecified osteoarthritis, unspecified site: Secondary | ICD-10-CM | POA: Diagnosis present

## 2021-05-11 DIAGNOSIS — Z8501 Personal history of malignant neoplasm of esophagus: Secondary | ICD-10-CM | POA: Diagnosis not present

## 2021-05-11 DIAGNOSIS — Y832 Surgical operation with anastomosis, bypass or graft as the cause of abnormal reaction of the patient, or of later complication, without mention of misadventure at the time of the procedure: Secondary | ICD-10-CM | POA: Diagnosis present

## 2021-05-11 DIAGNOSIS — K219 Gastro-esophageal reflux disease without esophagitis: Secondary | ICD-10-CM | POA: Diagnosis present

## 2021-05-11 DIAGNOSIS — D62 Acute posthemorrhagic anemia: Secondary | ICD-10-CM | POA: Diagnosis not present

## 2021-05-11 DIAGNOSIS — Z79899 Other long term (current) drug therapy: Secondary | ICD-10-CM

## 2021-05-11 DIAGNOSIS — I70211 Atherosclerosis of native arteries of extremities with intermittent claudication, right leg: Secondary | ICD-10-CM

## 2021-05-11 DIAGNOSIS — Z95820 Peripheral vascular angioplasty status with implants and grafts: Secondary | ICD-10-CM

## 2021-05-11 DIAGNOSIS — I739 Peripheral vascular disease, unspecified: Secondary | ICD-10-CM | POA: Diagnosis present

## 2021-05-11 DIAGNOSIS — Z419 Encounter for procedure for purposes other than remedying health state, unspecified: Secondary | ICD-10-CM

## 2021-05-11 DIAGNOSIS — I70422 Atherosclerosis of autologous vein bypass graft(s) of the extremities with rest pain, left leg: Secondary | ICD-10-CM

## 2021-05-11 DIAGNOSIS — Z9889 Other specified postprocedural states: Secondary | ICD-10-CM

## 2021-05-11 HISTORY — PX: FEMORAL-POPLITEAL BYPASS GRAFT: SHX937

## 2021-05-11 LAB — CBC
HCT: 39.7 % (ref 39.0–52.0)
Hemoglobin: 12.5 g/dL — ABNORMAL LOW (ref 13.0–17.0)
MCH: 28.7 pg (ref 26.0–34.0)
MCHC: 31.5 g/dL (ref 30.0–36.0)
MCV: 91.1 fL (ref 80.0–100.0)
Platelets: 309 10*3/uL (ref 150–400)
RBC: 4.36 MIL/uL (ref 4.22–5.81)
RDW: 13 % (ref 11.5–15.5)
WBC: 7.3 10*3/uL (ref 4.0–10.5)
nRBC: 0 % (ref 0.0–0.2)

## 2021-05-11 LAB — SURGICAL PCR SCREEN
MRSA, PCR: NEGATIVE
Staphylococcus aureus: NEGATIVE

## 2021-05-11 LAB — COMPREHENSIVE METABOLIC PANEL
ALT: 15 U/L (ref 0–44)
AST: 19 U/L (ref 15–41)
Albumin: 3.8 g/dL (ref 3.5–5.0)
Alkaline Phosphatase: 79 U/L (ref 38–126)
Anion gap: 11 (ref 5–15)
BUN: 16 mg/dL (ref 8–23)
CO2: 23 mmol/L (ref 22–32)
Calcium: 9.3 mg/dL (ref 8.9–10.3)
Chloride: 106 mmol/L (ref 98–111)
Creatinine, Ser: 0.79 mg/dL (ref 0.61–1.24)
GFR, Estimated: >60 mL/min (ref >60)
Glucose, Bld: 96 mg/dL (ref 70–99)
Potassium: 4.1 mmol/L (ref 3.5–5.1)
Sodium: 140 mmol/L (ref 135–145)
Total Bilirubin: 0.5 mg/dL (ref 0.3–1.2)
Total Protein: 6.8 g/dL (ref 6.5–8.1)

## 2021-05-11 LAB — POCT I-STAT 7, (LYTES, BLD GAS, ICA,H+H)
Acid-base deficit: 7 mmol/L — ABNORMAL HIGH (ref 0.0–2.0)
Bicarbonate: 18.1 mmol/L — ABNORMAL LOW (ref 20.0–28.0)
Calcium, Ion: 1.12 mmol/L — ABNORMAL LOW (ref 1.15–1.40)
HCT: 29 % — ABNORMAL LOW (ref 39.0–52.0)
Hemoglobin: 9.9 g/dL — ABNORMAL LOW (ref 13.0–17.0)
O2 Saturation: 93 %
Potassium: 4.5 mmol/L (ref 3.5–5.1)
Sodium: 141 mmol/L (ref 135–145)
TCO2: 19 mmol/L — ABNORMAL LOW (ref 22–32)
pCO2 arterial: 32.1 mmHg (ref 32.0–48.0)
pH, Arterial: 7.359 (ref 7.350–7.450)
pO2, Arterial: 68 mmHg — ABNORMAL LOW (ref 83.0–108.0)

## 2021-05-11 LAB — PREPARE RBC (CROSSMATCH)

## 2021-05-11 LAB — PROTIME-INR
INR: 1.1 (ref 0.8–1.2)
Prothrombin Time: 13.7 seconds (ref 11.4–15.2)

## 2021-05-11 LAB — URINALYSIS, ROUTINE W REFLEX MICROSCOPIC
Bacteria, UA: NONE SEEN
Bilirubin Urine: NEGATIVE
Glucose, UA: NEGATIVE mg/dL
Hgb urine dipstick: NEGATIVE
Ketones, ur: NEGATIVE mg/dL
Leukocytes,Ua: NEGATIVE
Nitrite: NEGATIVE
Protein, ur: NEGATIVE mg/dL
Specific Gravity, Urine: 1.017 (ref 1.005–1.030)
pH: 5 (ref 5.0–8.0)

## 2021-05-11 LAB — APTT: aPTT: 31 seconds (ref 24–36)

## 2021-05-11 LAB — GLUCOSE, CAPILLARY
Glucose-Capillary: 102 mg/dL — ABNORMAL HIGH (ref 70–99)
Glucose-Capillary: 126 mg/dL — ABNORMAL HIGH (ref 70–99)
Glucose-Capillary: 140 mg/dL — ABNORMAL HIGH (ref 70–99)

## 2021-05-11 SURGERY — BYPASS GRAFT FEMORAL-POPLITEAL ARTERY
Anesthesia: General | Site: Leg Upper | Laterality: Left

## 2021-05-11 MED ORDER — OXYCODONE HCL 5 MG PO TABS: 5.0000 mg | ORAL_TABLET | Freq: Once | ORAL | Status: DC | PRN

## 2021-05-11 MED ORDER — PROMETHAZINE HCL 25 MG/ML IJ SOLN: 6.2500 mg | INTRAMUSCULAR | Status: DC | PRN

## 2021-05-11 MED ORDER — ROCURONIUM BROMIDE 10 MG/ML (PF) SYRINGE
PREFILLED_SYRINGE | INTRAVENOUS | Status: AC
  Filled 2021-05-11: qty 10

## 2021-05-11 MED ORDER — LIDOCAINE 2% (20 MG/ML) 5 ML SYRINGE
INTRAMUSCULAR | Status: AC
  Filled 2021-05-11: qty 5

## 2021-05-11 MED ORDER — AMLODIPINE BESYLATE 5 MG PO TABS
2.5000 mg | ORAL_TABLET | Freq: Every day | ORAL | Status: DC
  Administered 2021-05-11 – 2021-05-13 (×3): 2.5 mg via ORAL
  Filled 2021-05-11 (×3): qty 1

## 2021-05-11 MED ORDER — MAGNESIUM SULFATE 2 GM/50ML IV SOLN: 2.0000 g | Freq: Every day | INTRAVENOUS | Status: DC | PRN

## 2021-05-11 MED ORDER — MIDAZOLAM HCL 2 MG/2ML IJ SOLN: 0.5000 mg | Freq: Once | INTRAMUSCULAR | Status: DC | PRN

## 2021-05-11 MED ORDER — ASPIRIN EC 81 MG PO TBEC
81.0000 mg | DELAYED_RELEASE_TABLET | Freq: Every day | ORAL | Status: DC
  Administered 2021-05-11 – 2021-05-13 (×3): 81 mg via ORAL
  Filled 2021-05-11 (×3): qty 1

## 2021-05-11 MED ORDER — CEFAZOLIN SODIUM-DEXTROSE 2-4 GM/100ML-% IV SOLN
2.0000 g | Freq: Three times a day (TID) | INTRAVENOUS | Status: AC
  Administered 2021-05-11 – 2021-05-12 (×2): 2 g via INTRAVENOUS
  Filled 2021-05-11 (×2): qty 100

## 2021-05-11 MED ORDER — ALPRAZOLAM 0.5 MG PO TABS
0.5000 mg | ORAL_TABLET | Freq: Every day | ORAL | Status: DC
  Administered 2021-05-11 – 2021-05-12 (×2): 0.5 mg via ORAL
  Filled 2021-05-11 (×2): qty 1

## 2021-05-11 MED ORDER — FENTANYL CITRATE (PF) 250 MCG/5ML IJ SOLN
INTRAMUSCULAR | Status: AC
  Filled 2021-05-11: qty 5

## 2021-05-11 MED ORDER — LATANOPROST 0.005 % OP SOLN
1.0000 [drp] | Freq: Every day | OPHTHALMIC | Status: DC
  Administered 2021-05-11 – 2021-05-12 (×2): 1 [drp] via OPHTHALMIC
  Filled 2021-05-11: qty 2.5

## 2021-05-11 MED ORDER — NITROGLYCERIN 0.4 MG SL SUBL: 0.4000 mg | SUBLINGUAL_TABLET | SUBLINGUAL | Status: DC | PRN

## 2021-05-11 MED ORDER — ALBUTEROL SULFATE (2.5 MG/3ML) 0.083% IN NEBU: 2.5000 mg | INHALATION_SOLUTION | Freq: Four times a day (QID) | RESPIRATORY_TRACT | Status: DC | PRN

## 2021-05-11 MED ORDER — IODIXANOL 320 MG/ML IV SOLN
INTRAVENOUS | Status: DC | PRN
  Administered 2021-05-11: 70 mL via INTRA_ARTERIAL
  Administered 2021-05-11: 100 mL via INTRA_ARTERIAL

## 2021-05-11 MED ORDER — PROPOFOL 10 MG/ML IV BOLUS
INTRAVENOUS | Status: AC
  Filled 2021-05-11: qty 20

## 2021-05-11 MED ORDER — ACETAMINOPHEN 650 MG RE SUPP: 325.0000 mg | RECTAL | Status: DC | PRN

## 2021-05-11 MED ORDER — DEXAMETHASONE SODIUM PHOSPHATE 10 MG/ML IJ SOLN
INTRAMUSCULAR | Status: AC
  Filled 2021-05-11: qty 1

## 2021-05-11 MED ORDER — PROPOFOL 10 MG/ML IV BOLUS
INTRAVENOUS | Status: DC | PRN
  Administered 2021-05-11: 100 mg via INTRAVENOUS

## 2021-05-11 MED ORDER — SODIUM CHLORIDE 0.9 % IV SOLN: INTRAVENOUS | Status: DC

## 2021-05-11 MED ORDER — OXYCODONE HCL 5 MG/5ML PO SOLN: 5.0000 mg | Freq: Once | ORAL | Status: DC | PRN

## 2021-05-11 MED ORDER — POTASSIUM CHLORIDE CRYS ER 20 MEQ PO TBCR: 20.0000 meq | EXTENDED_RELEASE_TABLET | Freq: Every day | ORAL | Status: DC | PRN

## 2021-05-11 MED ORDER — HYDRALAZINE HCL 20 MG/ML IJ SOLN
5.0000 mg | INTRAMUSCULAR | Status: DC | PRN
Start: 2021-05-11 — End: 2021-05-13

## 2021-05-11 MED ORDER — PROPOFOL 1000 MG/100ML IV EMUL
INTRAVENOUS | Status: AC
  Filled 2021-05-11: qty 100

## 2021-05-11 MED ORDER — CHLORHEXIDINE GLUCONATE CLOTH 2 % EX PADS: 6.0000 | MEDICATED_PAD | Freq: Once | CUTANEOUS | Status: DC

## 2021-05-11 MED ORDER — EPHEDRINE SULFATE-NACL 50-0.9 MG/10ML-% IV SOSY
PREFILLED_SYRINGE | INTRAVENOUS | Status: DC | PRN
  Administered 2021-05-11: 10 mg via INTRAVENOUS

## 2021-05-11 MED ORDER — ROSUVASTATIN CALCIUM 20 MG PO TABS
20.0000 mg | ORAL_TABLET | Freq: Every day | ORAL | Status: DC
  Administered 2021-05-11 – 2021-05-13 (×3): 20 mg via ORAL
  Filled 2021-05-11 (×3): qty 1

## 2021-05-11 MED ORDER — CLOPIDOGREL BISULFATE 75 MG PO TABS
75.0000 mg | ORAL_TABLET | Freq: Every day | ORAL | Status: DC
  Administered 2021-05-12 – 2021-05-13 (×2): 75 mg via ORAL
  Filled 2021-05-11 (×2): qty 1

## 2021-05-11 MED ORDER — DEXAMETHASONE SODIUM PHOSPHATE 10 MG/ML IJ SOLN
INTRAMUSCULAR | Status: DC | PRN
  Administered 2021-05-11: 8 mg via INTRAVENOUS

## 2021-05-11 MED ORDER — ALUM & MAG HYDROXIDE-SIMETH 200-200-20 MG/5ML PO SUSP: 15.0000 mL | ORAL | Status: DC | PRN

## 2021-05-11 MED ORDER — SODIUM CHLORIDE 0.9 % IV SOLN: 500.0000 mL | Freq: Once | INTRAVENOUS | Status: DC | PRN

## 2021-05-11 MED ORDER — SUGAMMADEX SODIUM 200 MG/2ML IV SOLN
INTRAVENOUS | Status: DC | PRN
  Administered 2021-05-11: 150 mg via INTRAVENOUS

## 2021-05-11 MED ORDER — 0.9 % SODIUM CHLORIDE (POUR BTL) OPTIME
TOPICAL | Status: DC | PRN
  Administered 2021-05-11: 2000 mL

## 2021-05-11 MED ORDER — ROCURONIUM BROMIDE 100 MG/10ML IV SOLN
INTRAVENOUS | Status: DC | PRN
  Administered 2021-05-11: 60 mg via INTRAVENOUS
  Administered 2021-05-11 (×2): 20 mg via INTRAVENOUS

## 2021-05-11 MED ORDER — PHENYLEPHRINE HCL-NACL 20-0.9 MG/250ML-% IV SOLN
INTRAVENOUS | Status: DC | PRN
  Administered 2021-05-11: 40 ug/min via INTRAVENOUS

## 2021-05-11 MED ORDER — SODIUM CHLORIDE 0.9% IV SOLUTION: Freq: Once | INTRAVENOUS | Status: DC

## 2021-05-11 MED ORDER — HEPARIN SODIUM (PORCINE) 1000 UNIT/ML IJ SOLN
INTRAMUSCULAR | Status: DC | PRN
  Administered 2021-05-11: 3000 [IU] via INTRAVENOUS
  Administered 2021-05-11: 7000 [IU] via INTRAVENOUS

## 2021-05-11 MED ORDER — ACETAMINOPHEN 325 MG PO TABS: 325.0000 mg | ORAL_TABLET | ORAL | Status: DC | PRN

## 2021-05-11 MED ORDER — HEPARIN SODIUM (PORCINE) 5000 UNIT/ML IJ SOLN
5000.0000 [IU] | Freq: Three times a day (TID) | INTRAMUSCULAR | Status: DC
  Administered 2021-05-12 – 2021-05-13 (×4): 5000 [IU] via SUBCUTANEOUS
  Filled 2021-05-11 (×4): qty 1

## 2021-05-11 MED ORDER — VASOPRESSIN 20 UNIT/ML IV SOLN
INTRAVENOUS | Status: DC | PRN
  Administered 2021-05-11 (×5): 1 [IU] via INTRAVENOUS

## 2021-05-11 MED ORDER — ONDANSETRON HCL 4 MG/2ML IJ SOLN
INTRAMUSCULAR | Status: AC
  Filled 2021-05-11: qty 2

## 2021-05-11 MED ORDER — LOSARTAN POTASSIUM 50 MG PO TABS
100.0000 mg | ORAL_TABLET | Freq: Every day | ORAL | Status: DC
  Administered 2021-05-11 – 2021-05-12 (×2): 100 mg via ORAL
  Filled 2021-05-11 (×2): qty 2

## 2021-05-11 MED ORDER — GUAIFENESIN-DM 100-10 MG/5ML PO SYRP: 15.0000 mL | ORAL_SOLUTION | ORAL | Status: DC | PRN

## 2021-05-11 MED ORDER — OXYCODONE-ACETAMINOPHEN 5-325 MG PO TABS
1.0000 | ORAL_TABLET | ORAL | Status: DC | PRN
  Administered 2021-05-11 – 2021-05-13 (×5): 2 via ORAL
  Filled 2021-05-11 (×5): qty 2

## 2021-05-11 MED ORDER — PHENOL 1.4 % MT LIQD: 1.0000 | OROMUCOSAL | Status: DC | PRN

## 2021-05-11 MED ORDER — SUCCINYLCHOLINE CHLORIDE 200 MG/10ML IV SOSY
PREFILLED_SYRINGE | INTRAVENOUS | Status: DC | PRN
  Administered 2021-05-11: 120 mg via INTRAVENOUS

## 2021-05-11 MED ORDER — METOPROLOL TARTRATE 5 MG/5ML IV SOLN: 2.0000 mg | INTRAVENOUS | Status: DC | PRN

## 2021-05-11 MED ORDER — POLYETHYLENE GLYCOL 3350 17 G PO PACK: 17.0000 g | PACK | Freq: Every day | ORAL | Status: DC | PRN

## 2021-05-11 MED ORDER — HEMOSTATIC AGENTS (NO CHARGE) OPTIME
TOPICAL | Status: DC | PRN
  Administered 2021-05-11 (×6): 1 via TOPICAL

## 2021-05-11 MED ORDER — HEPARIN 6000 UNIT IRRIGATION SOLUTION
Status: AC
  Filled 2021-05-11: qty 500

## 2021-05-11 MED ORDER — PANTOPRAZOLE SODIUM 40 MG PO TBEC
40.0000 mg | DELAYED_RELEASE_TABLET | Freq: Every day | ORAL | Status: DC
  Administered 2021-05-11 – 2021-05-13 (×3): 40 mg via ORAL
  Filled 2021-05-11 (×3): qty 1

## 2021-05-11 MED ORDER — PROTAMINE SULFATE 10 MG/ML IV SOLN
INTRAVENOUS | Status: AC
  Filled 2021-05-11: qty 5

## 2021-05-11 MED ORDER — HYDROMORPHONE HCL 1 MG/ML IJ SOLN
INTRAMUSCULAR | Status: AC
  Filled 2021-05-11: qty 1

## 2021-05-11 MED ORDER — HEMOSTATIC AGENTS (NO CHARGE) OPTIME
TOPICAL | Status: DC | PRN
  Administered 2021-05-11: 1 via TOPICAL

## 2021-05-11 MED ORDER — CHLORHEXIDINE GLUCONATE 0.12 % MT SOLN
OROMUCOSAL | Status: AC
  Administered 2021-05-11: 15 mL via OROMUCOSAL
  Filled 2021-05-11: qty 15

## 2021-05-11 MED ORDER — DOCUSATE SODIUM 100 MG PO CAPS
100.0000 mg | ORAL_CAPSULE | Freq: Every day | ORAL | Status: DC
  Administered 2021-05-12 – 2021-05-13 (×2): 100 mg via ORAL
  Filled 2021-05-11 (×2): qty 1

## 2021-05-11 MED ORDER — SUGAMMADEX SODIUM 200 MG/2ML IV SOLN: INTRAVENOUS | Status: DC | PRN

## 2021-05-11 MED ORDER — PROTAMINE SULFATE 10 MG/ML IV SOLN
INTRAVENOUS | Status: DC | PRN
  Administered 2021-05-11: 50 mg via INTRAVENOUS

## 2021-05-11 MED ORDER — VASOPRESSIN 20 UNIT/ML IV SOLN
INTRAVENOUS | Status: AC
  Filled 2021-05-11: qty 1

## 2021-05-11 MED ORDER — ONDANSETRON HCL 4 MG/2ML IJ SOLN
4.0000 mg | Freq: Four times a day (QID) | INTRAMUSCULAR | Status: DC | PRN
Start: 2021-05-11 — End: 2021-05-13
  Administered 2021-05-11: 4 mg via INTRAVENOUS
  Filled 2021-05-11: qty 2

## 2021-05-11 MED ORDER — LABETALOL HCL 5 MG/ML IV SOLN: 10.0000 mg | INTRAVENOUS | Status: DC | PRN

## 2021-05-11 MED ORDER — FENTANYL CITRATE (PF) 100 MCG/2ML IJ SOLN
INTRAMUSCULAR | Status: DC | PRN
  Administered 2021-05-11 (×5): 50 ug via INTRAVENOUS

## 2021-05-11 MED ORDER — METOPROLOL SUCCINATE ER 25 MG PO TB24
12.5000 mg | ORAL_TABLET | Freq: Every day | ORAL | Status: DC
  Administered 2021-05-11 – 2021-05-13 (×3): 12.5 mg via ORAL
  Filled 2021-05-11 (×3): qty 1

## 2021-05-11 MED ORDER — ORAL CARE MOUTH RINSE: 15.0000 mL | Freq: Once | OROMUCOSAL | Status: AC

## 2021-05-11 MED ORDER — MONTELUKAST SODIUM 10 MG PO TABS
10.0000 mg | ORAL_TABLET | Freq: Every day | ORAL | Status: DC
  Administered 2021-05-11 – 2021-05-13 (×3): 10 mg via ORAL
  Filled 2021-05-11 (×3): qty 1

## 2021-05-11 MED ORDER — PHENYLEPHRINE 40 MCG/ML (10ML) SYRINGE FOR IV PUSH (FOR BLOOD PRESSURE SUPPORT)
PREFILLED_SYRINGE | INTRAVENOUS | Status: DC | PRN
  Administered 2021-05-11 (×3): 80 ug via INTRAVENOUS

## 2021-05-11 MED ORDER — LACTATED RINGERS IV SOLN: INTRAVENOUS | Status: DC

## 2021-05-11 MED ORDER — CHLORHEXIDINE GLUCONATE CLOTH 2 % EX PADS
6.0000 | MEDICATED_PAD | Freq: Once | CUTANEOUS | Status: DC
  Administered 2021-05-11: 6 via TOPICAL

## 2021-05-11 MED ORDER — HEPARIN 6000 UNIT IRRIGATION SOLUTION
Status: DC | PRN
  Administered 2021-05-11: 1

## 2021-05-11 MED ORDER — ALBUMIN HUMAN 5 % IV SOLN: INTRAVENOUS | Status: DC | PRN

## 2021-05-11 MED ORDER — CHLORHEXIDINE GLUCONATE 0.12 % MT SOLN: 15.0000 mL | Freq: Once | OROMUCOSAL | Status: AC

## 2021-05-11 MED ORDER — BACLOFEN 10 MG PO TABS
10.0000 mg | ORAL_TABLET | Freq: Three times a day (TID) | ORAL | Status: DC | PRN
  Filled 2021-05-11: qty 1

## 2021-05-11 MED ORDER — ONDANSETRON HCL 4 MG/2ML IJ SOLN
INTRAMUSCULAR | Status: DC | PRN
  Administered 2021-05-11: 4 mg via INTRAVENOUS

## 2021-05-11 MED ORDER — HYDROMORPHONE HCL 1 MG/ML IJ SOLN
0.2500 mg | INTRAMUSCULAR | Status: DC | PRN
  Administered 2021-05-11 (×2): 0.5 mg via INTRAVENOUS

## 2021-05-11 MED ORDER — BISACODYL 5 MG PO TBEC: 5.0000 mg | DELAYED_RELEASE_TABLET | Freq: Every day | ORAL | Status: DC | PRN

## 2021-05-11 MED ORDER — CEFAZOLIN SODIUM-DEXTROSE 2-4 GM/100ML-% IV SOLN
2.0000 g | INTRAVENOUS | Status: AC
  Administered 2021-05-11: 2 g via INTRAVENOUS
  Filled 2021-05-11: qty 100

## 2021-05-11 MED ORDER — MEPERIDINE HCL 25 MG/ML IJ SOLN: 6.2500 mg | INTRAMUSCULAR | Status: DC | PRN

## 2021-05-11 SURGICAL SUPPLY — 77 items
ADH SKN CLS APL DERMABOND .7 (GAUZE/BANDAGES/DRESSINGS) ×2
ADH SRG 12 PREFL SYR 3 SPRDR (MISCELLANEOUS) ×1
AGENT HMST KT MTR STRL THRMB (HEMOSTASIS) ×1
AGENT HMST SPONGE THK3/8 (HEMOSTASIS)
BAG COUNTER SPONGE SURGICOUNT (BAG) ×2 IMPLANT
BAG SPNG CNTER NS LX DISP (BAG) ×1
BANDAGE ESMARK 6X9 LF (GAUZE/BANDAGES/DRESSINGS) IMPLANT
BLADE CLIPPER SURG (BLADE) ×2 IMPLANT
BNDG CMPR 9X6 STRL LF SNTH (GAUZE/BANDAGES/DRESSINGS)
BNDG ELASTIC 6X5.8 VLCR STR LF (GAUZE/BANDAGES/DRESSINGS) ×1 IMPLANT
BNDG ESMARK 6X9 LF (GAUZE/BANDAGES/DRESSINGS)
CANISTER SUCT 3000ML PPV (MISCELLANEOUS) ×2 IMPLANT
CLIP FOGARTY SPRING 6M (CLIP) ×1 IMPLANT
CLIP VESOCCLUDE MED 24/CT (CLIP) ×2 IMPLANT
CLIP VESOCCLUDE SM WIDE 24/CT (CLIP) ×2 IMPLANT
COVER PROBE W GEL 5X96 (DRAPES) ×2 IMPLANT
COVER SURGICAL LIGHT HANDLE (MISCELLANEOUS) ×1 IMPLANT
CUFF TOURN SGL QUICK 24 (TOURNIQUET CUFF)
CUFF TOURN SGL QUICK 34 (TOURNIQUET CUFF)
CUFF TOURN SGL QUICK 42 (TOURNIQUET CUFF) IMPLANT
CUFF TRNQT CYL 24X4X16.5-23 (TOURNIQUET CUFF) IMPLANT
CUFF TRNQT CYL 34X4.125X (TOURNIQUET CUFF) IMPLANT
DERMABOND ADVANCED (GAUZE/BANDAGES/DRESSINGS) ×2
DERMABOND ADVANCED .7 DNX12 (GAUZE/BANDAGES/DRESSINGS) IMPLANT
DRAIN CHANNEL 15F RND FF W/TCR (WOUND CARE) IMPLANT
DRAPE C-ARM 42X72 X-RAY (DRAPES) ×2 IMPLANT
DRAPE HALF SHEET 40X57 (DRAPES) IMPLANT
DRSG COVADERM 4X6 (GAUZE/BANDAGES/DRESSINGS) ×1 IMPLANT
ELECT REM PT RETURN 9FT ADLT (ELECTROSURGICAL) ×2
ELECTRODE REM PT RTRN 9FT ADLT (ELECTROSURGICAL) ×1 IMPLANT
EVACUATOR SILICONE 100CC (DRAIN) IMPLANT
GLOVE SRG 8 PF TXTR STRL LF DI (GLOVE) ×2 IMPLANT
GLOVE SURG POLYISO LF SZ8 (GLOVE) IMPLANT
GLOVE SURG UNDER POLY LF SZ8 (GLOVE) ×6
GOWN STRL REUS W/ TWL LRG LVL3 (GOWN DISPOSABLE) ×2 IMPLANT
GOWN STRL REUS W/TWL 2XL LVL3 (GOWN DISPOSABLE) ×3 IMPLANT
GOWN STRL REUS W/TWL LRG LVL3 (GOWN DISPOSABLE) ×4
GRAFT PROPATEN W/RING 6X80X60 (Vascular Products) ×1 IMPLANT
HEMOSTAT SNOW SURGICEL 2X4 (HEMOSTASIS) ×5 IMPLANT
HEMOSTAT SPONGE AVITENE ULTRA (HEMOSTASIS) IMPLANT
INSERT FOGARTY SM (MISCELLANEOUS) IMPLANT
KIT BASIN OR (CUSTOM PROCEDURE TRAY) ×2 IMPLANT
KIT TURNOVER KIT B (KITS) ×2 IMPLANT
LOOP VESSEL MAXI BLUE (MISCELLANEOUS) ×1 IMPLANT
LOOP VESSEL MINI RED (MISCELLANEOUS) ×1 IMPLANT
NS IRRIG 1000ML POUR BTL (IV SOLUTION) ×4 IMPLANT
PACK PERIPHERAL VASCULAR (CUSTOM PROCEDURE TRAY) ×2 IMPLANT
PAD ARMBOARD 7.5X6 YLW CONV (MISCELLANEOUS) ×4 IMPLANT
SET COLLECT BLD 21X3/4 12 (NEEDLE) ×1 IMPLANT
SET MICROPUNCTURE 5F STIFF (MISCELLANEOUS) IMPLANT
SPONGE T-LAP 18X18 ~~LOC~~+RFID (SPONGE) ×1 IMPLANT
STOPCOCK 4 WAY LG BORE MALE ST (IV SETS) ×2 IMPLANT
SURGIFLO W/THROMBIN 8M KIT (HEMOSTASIS) ×1 IMPLANT
SUT ETHILON 3 0 PS 1 (SUTURE) IMPLANT
SUT GORETEX 5 0 TT13 24 (SUTURE) IMPLANT
SUT GORETEX 6.0 TT13 (SUTURE) IMPLANT
SUT MNCRL AB 4-0 PS2 18 (SUTURE) ×6 IMPLANT
SUT PROLENE 5 0 C 1 24 (SUTURE) ×9 IMPLANT
SUT PROLENE 6 0 BV (SUTURE) ×3 IMPLANT
SUT PROLENE 6 0 C 1 24 (SUTURE) ×2 IMPLANT
SUT PROLENE 7 0 BV 1 (SUTURE) IMPLANT
SUT PROLENE RB 2 BLUE 6-0 24 (SUTURE) ×1 IMPLANT
SUT SILK 2 0 PERMA HAND 18 BK (SUTURE) IMPLANT
SUT SILK 3 0 (SUTURE)
SUT SILK 3-0 18XBRD TIE 12 (SUTURE) IMPLANT
SUT VIC AB 2-0 CT1 27 (SUTURE) ×6
SUT VIC AB 2-0 CT1 TAPERPNT 27 (SUTURE) ×2 IMPLANT
SUT VIC AB 3-0 SH 27 (SUTURE) ×8
SUT VIC AB 3-0 SH 27X BRD (SUTURE) ×3 IMPLANT
SYR 10ML KIT SKIN ADHESIVE (MISCELLANEOUS) ×2 IMPLANT
TAPE UMBILICAL COTTON 1/8X30 (MISCELLANEOUS) ×1 IMPLANT
TOWEL GREEN STERILE (TOWEL DISPOSABLE) ×2 IMPLANT
TRAY FOLEY MTR SLVR 16FR STAT (SET/KITS/TRAYS/PACK) ×2 IMPLANT
TUBE CONNECTING 20X1/4 (TUBING) ×4 IMPLANT
TUBING EXTENTION W/L.L. (IV SETS) ×1 IMPLANT
UNDERPAD 30X36 HEAVY ABSORB (UNDERPADS AND DIAPERS) ×2 IMPLANT
WATER STERILE IRR 1000ML POUR (IV SOLUTION) ×2 IMPLANT

## 2021-05-11 NOTE — Transfer of Care (Signed)
Immediate Anesthesia Transfer of Care Note  Patient: Gregory Eaton  Procedure(s) Performed: LEFT FEMORAL-BELOW KNEE POPLITEAL ARTERY BYPASS GRAFT WITH 6 mm PTFE and COMPLETION ANGIOGRAM (Left: Leg Upper)  Patient Location: PACU  Anesthesia Type:General  Level of Consciousness: awake, alert  and patient cooperative  Airway & Oxygen Therapy: Patient Spontanous Breathing and Patient connected to face mask oxygen  Post-op Assessment: Report given to RN and Post -op Vital signs reviewed and stable  Post vital signs: Reviewed and stable  Last Vitals:  Vitals Value Taken Time  BP 108/49 05/11/21 1400  Temp 36.7 C 05/11/21 1345  Pulse 75 05/11/21 1403  Resp 16 05/11/21 1403  SpO2 94 % 05/11/21 1403  Vitals shown include unvalidated device data.  Last Pain:  Vitals:   05/11/21 1345  TempSrc: Axillary  PainSc:       Patients Stated Pain Goal: 0 (29/51/88 4166)  Complications: No notable events documented.

## 2021-05-11 NOTE — H&P (Signed)
Note   Gregory Eaton is a 75 year old male who originally presented with right lower extremity lifestyle limiting claudication.  Further imaging demonstrated critical stenosis left-sided femoral to below-knee popliteal artery bypass that was performed in 1993 by Dr. Ricard Dillon.  I originally tried to improve the right-sided lifestyle limiting claudication and left-sided bypass critical stenosis with a hybrid OR case , however I was unable to cross the 99% stenosis in the bypass.  I was able to improve inflow with iliac stenting and right-sided common femoral endarterectomy.  The original plan was for open bypass revision once the patient healed from this operation, however in the interim, the bypass thombosed.  Thrombosis of the bypass resulted in left lower extremity rest pain.  Gregory Eaton has been unable to sleep at night due to this pain.    Gregory Eaton does not have vein due to harvest for CABG and previous bypass. Physical exam unchanged from when he was seen in the office last Right lower extremity now asymptomatic after inflow stenting and femoral endarterectomy  After discussing the risks and benefits of redo femoral to below-knee popliteal artery bypass using 6 mm ringed PTFE for Rutherford 4 critical limb ischemia  Gregory Eaton elected to proceed.  Gregory John MD  ___________________________   Gregory Eaton  CC: Wound check, left lower extremity pain Requesting Provider:  No ref. provider found  HPI: Gregory Eaton is a 75 y.o. (26-Jun-1946) male with history of bilateral lower extremity lifestyle limiting claudication, right greater than left.  The patient most recently underwent right iliofemoral endarterectomy, bilateral common iliac artery stenting, bilateral external iliac artery stenting. Patient also had a previous greater saphenous vein bypass in 1993 with Dr. [name].  This demonstrated critical stenosis at the distal anastomosis, unfortunately was unable to cross the lesion with a wire  and therefore the decision was made to surgically revise the bypass once the patient recovered from the surgery listed above.  Patient presents today accompanied by his wife doing well.  He feels as though his right leg has completely healed and feels normal.  The left leg has now become problematic, and is described as severe claudication with intermittent rest pain.  The pt is  on a statin for cholesterol management.  The pt is  on a daily aspirin.   Other AC:  plavix The pt is  on medication for hypertension.   The pt is not diabetic.  Tobacco hx:  -  Past Medical History:  Diagnosis Date   Adenomatous colon polyp 03/2009   Last colonoscopy by Dr. [name]    Adenomatous polyp 2010   Adenomatous polyp of colon 11/03/2010   Arthritis    Barrett's esophagus    CAD (coronary artery disease)    Complication of anesthesia    has a shortened esophogus due to CA.    COPD (chronic obstructive pulmonary disease) (HCC)    Severe emphysema per CT   Diverticulosis    Emphysema of lung (HCC)    Esophageal carcinoma ([place]) 03/2009   T1N1M0   GERD (gastroesophageal reflux disease)    Glaucoma    History of Doppler ultrasound 11/09/2011   03/2014- 50-69% L ICA stenosis;carotid doppler; L bulb/prox ICA 0-49% diameter reduction; L vertebral artery - occlusive ds; L ECA  demonstrates severe amount of fibrous plaque   History of Doppler ultrasound 11/09/2011   LEAs; R ABI - mod art. insuff.; L ABI normal at rest; R SFA - occlusive ds; L SFA - occlusive ds; patent fem-pop graft  History of echocardiogram 08/27/2009   EF >55%   History of hiatal hernia    History of kidney stones 1965   History of nuclear stress test 11/24/2011   lexiscan; normal perfusion; low risk scan; non-diagnostic for ischemia   Hyperlipidemia    Hypertension    Left carotid artery stenosis 04/08/2014   Pneumonia    Pre-diabetes    Pulmonary nodule, right 04/08/2014   2.8 mm-incidental finding on CT   PVD (peripheral  vascular disease) ([place])    Tachyarrhythmia 1999   Status post ablation at [facility]   Tobacco abuse     Past Surgical History:  Procedure Laterality Date   ABDOMINAL AORTOGRAM W/LOWER EXTREMITY N/A 04/01/2021   Procedure: ABDOMINAL AORTOGRAM W/LOWER EXTREMITY;  Surgeon: Gregory John, MD;  Location: [facility];  Service: Cardiovascular;  Laterality: N/A;   BIOPSY  10/24/2017   Procedure: BIOPSY;  Surgeon: [name], MD;  [facility];  Service: Endoscopy;;  esophagus   CATARACT EXTRACTION Bilateral    COLONOSCOPY  03/17/2009   Dr.Rourk- normal rectum, sigmoid diverticula, some pale sigmoid mucosa with diffuse petechiae. pedunculated polyp at the splenic flexure, remainder of colonic mucosa appeared normal. bx= adenomatous polyp   COLONOSCOPY  04/19/2003   Dr.Rehman- few diverticu;a at the sigmoid colon, 3 small polyps, one at the transverse colon and 2 at the sigmoid, small external hemorrhoids. bx report not available.    COLONOSCOPY WITH PROPOFOL N/A 10/24/2017   Dr. [name]: Diverticulosis, 11 mm polyp at the ileocecal valve, tubular adenoma.  Repeat colonoscopy in 3 years   CORONARY ARTERY BYPASS GRAFT  1998   [name]   ENDARTERECTOMY FEMORAL Right 04/13/2021   Procedure: RIGHT ILIOFEMORAL ARTERY ENDARTERECTOMY WITH PATCH ANGIOPLASTY USING HEMASHIELD [place];  Surgeon: Gregory John, MD;  Location: [facility];  Service: Vascular;  Laterality: Right;   ESOPHAGECTOMY  2010   [facility] Dr. [name]   ESOPHAGOGASTRODUODENOSCOPY  11/25/2010   Dr.Rourk- s/p esophagectomy with gastric pull-up, esophageal erosions straddling the surgical anastomosis, salmon colored epithelium coming up a good centimeter to a centimeter and a half above the suture line, islands of salmon colored epithelium in the most poximal residual esophagus, remainder of gastric mucosa appeared normal. bx= swamous &gastric glandular mucosa w/chronic active inflammation    ESOPHAGOGASTRODUODENOSCOPY  03/17/2009   Dr.Rourk- 4cm segment of salmon-colored epithlium distal esophagus suspicious for barretts esophagus. area of suspicious nodularity w/in this segment bx seperately. small to moderate size hiatal hernia, o/w normal stomach D1 and D2 bx=adenocarcinoma   ESOPHAGOGASTRODUODENOSCOPY (EGD) WITH PROPOFOL N/A 10/24/2017   Dr. [name]: Remnant of esophagus with anastomosis with stomach at 24 cm from the incisors, 2 cm above the anastomosis he was found to have Barrett's but no dysplasia.  Advised for repeat EGD in 3 years   EYE SURGERY     cataract   FEMORAL-POPLITEAL BYPASS GRAFT  1993   occlusive ds in R SFA   GASTRIC PULL THROUGH  2010   With esophagectomy   INSERTION OF ILIAC STENT Bilateral 04/13/2021   Procedure: INSERTION OF BILATERAL COMMON ILIAC KISSING STENTS AND INSERION OF RIGHT EXTERNAL ILIAC STENT;  Surgeon: Gregory John, MD;  Location: [facility];  Service: Vascular;  Laterality: Bilateral;   JOINT REPLACEMENT     LIPOMA EXCISION  2010   LOWER EXTREMITY ANGIOGRAM Left 04/13/2021   Procedure: LEFT LOWER EXTREMITY ANGIOGRAM ;  Surgeon: Gregory John, MD;  Location: [facility];  Service: Vascular;  Laterality: Left;   PERIPHERAL VASCULAR INTERVENTION  04/01/2021   Procedure: PERIPHERAL VASCULAR INTERVENTION;  Surgeon: Gregory John, MD;  Location: [facility];  Service: Cardiovascular;;   POLYPECTOMY  10/24/2017   Procedure: POLYPECTOMY;  Surgeon: [name], MD;  [facility];  Service: Endoscopy;;  colon    TOTAL HIP ARTHROPLASTY Left 01/30/2019   Procedure: TOTAL HIP ARTHROPLASTY ANTERIOR APPROACH;  Surgeon: Renette Butters, MD;  Location: [facility];  Service: Orthopedics;  Laterality: Left;   ULTRASOUND GUIDANCE FOR VASCULAR ACCESS Left 04/13/2021   Procedure: ULTRASOUND GUIDANCE FOR VASCULAR ACCESS, left femoral;  Surgeon: Gregory John, MD;  Location: [facility] OR;  Service: Vascular;  Laterality: Left;    Social History    Socioeconomic History   Marital status: Married    Spouse name: Not on file   Number of children: 2   Years of education: Not on file   Highest education level: Not on file  Occupational History   Occupation: retired    Fish farm manager: RETIRED    Comment: truck driver  Tobacco Use   Smoking status: Former    Packs/day: 2.00    Years: 40.00    Pack years: 80.00    Types: Cigarettes    Start date: 1957    Quit date: 07/20/1995    Years since quitting: 25.8   Smokeless tobacco: Never  Vaping Use   Vaping Use: Never used  Substance and Sexual Activity   Alcohol use: No    Alcohol/week: 0.0 standard drinks   Drug use: No   Sexual activity: Yes    Partners: Female    Birth control/protection: Condom    Comment: friend  Other Topics Concern   Not on file  Social History Narrative   Not on file   Social Determinants of Health   Financial Resource Strain: Not on file  Food Insecurity: Not on file  Transportation Needs: Not on file  Physical Activity: Not on file  Stress: Not on file  Social Connections: Not on file  Intimate Partner Violence: Not on file    Family History  Problem Relation Age of Onset   GER disease Mother    Coronary artery disease Brother    Congenital heart disease Sister    Colon cancer Neg Hx     Current Facility-Administered Medications  Medication Dose Route Frequency Provider Last Rate Last Admin   0.9 %  sodium chloride infusion   Intravenous Continuous Gregory John, MD       ceFAZolin (ANCEF) IVPB 2g/100 mL premix  2 g Intravenous 30 min Pre-Op Gregory John, MD       Chlorhexidine Gluconate Cloth 2 % PADS 6 each  6 each Topical Once Gregory John, MD       And   Chlorhexidine Gluconate Cloth 2 % PADS 6 each  6 each Topical Once Gregory John, MD       lactated ringers infusion   Intravenous Continuous Annye Asa, MD 10 mL/hr at 05/11/21 0819 New Bag at 05/11/21 0819    Allergies  Allergen Reactions   Altace  [Ramipril] Cough     REVIEW OF SYSTEMS:   [X]  denotes positive finding, [ ]  denotes negative finding Cardiac  Comments:  Chest pain or chest pressure:    Shortness of breath upon exertion:    Short of breath when lying flat:    Irregular heart rhythm:        Vascular  Pain in calf, thigh, or hip brought on by ambulation: X   Pain in feet at night that wakes you up from your sleep:     Blood clot in your veins:    Leg swelling:         Pulmonary    Oxygen at home:    Productive cough:     Wheezing:         Neurologic    Sudden weakness in arms or legs:     Sudden numbness in arms or legs:     Sudden onset of difficulty speaking or slurred speech:    Temporary loss of vision in one eye:     Problems with dizziness:         Gastrointestinal    Blood in stool:     Vomited blood:         Genitourinary    Burning when urinating:     Blood in urine:        Psychiatric    Major depression:         Hematologic    Bleeding problems:    Problems with blood clotting too easily:        Skin    Rashes or ulcers:        Constitutional    Fever or chills:      PHYSICAL EXAMINATION:  Vitals:   05/11/21 0724  BP: (!) 159/53  Pulse: 74  Resp: 18  Temp: 97.8 F (36.6 C)  TempSrc: Oral  SpO2: 96%  Weight: 70.3 kg  Height: 5\' 8"  (1.727 m)    General:  WDWN in NAD; vital signs documented above Gait: Not observed HENT: WNL, normocephalic Pulmonary: normal non-labored breathing , without Rales, rhonchi,  wheezing Cardiac: regular HR Abdomen: soft, NT, no masses Skin: without rashes Vascular Exam/Pulses:  Right Left  Radial 2+ (normal) 2+ (normal)  Ulnar 2+ (normal) 2+ (normal)  Femoral 2+ (normal) 2+ (normal)  Popliteal    DP monophasic none  PT Multiphasic none   Extremities:  no ischemic changes, without Gangrene , without cellulitis; without open wounds;  Right groin wound healing well. The patient's left-sided saphenous vein bypass was insonated  demonstrating no color-flow concerning for interim thrombosis since imaged in the operating room Musculoskeletal: no muscle wasting or atrophy  Neurologic: A&O X 3;  No focal weakness or paresthesias are detected Psychiatric:  The pt has Normal affect.   Non-Invasive Vascular Imaging:   Today's ABI demonstrated improved perfusion in the patient's right leg, decreased perfusion in the patient's left leg consistent with thrombosed bypass.   ASSESSMENT/PLAN:: 75 y.o. male presenting with improvement in lifestyle limiting claudication in the right leg and worsening claudication/rest pain in the left leg status post bilateral iliac artery, external iliac artery stenting, right iliofemoral endarterectomy.  The patient's left-sided femoral to below-knee popliteal artery bypass was not amenable to endovascular intervention.  I had plans for open surgical revision in the coming weeks.  Unfortunately, in clinic, I insonated the bypass and there was no color flow.  [place] will need a redo femoral to below-knee popliteal artery bypass in the left leg.  Plan will be to perform a redo femoral to popliteal artery bypass using PTFE as both saphenous veins have been used, one for bypass, one for CABG.  I will call the patient with the results from his ultrasound.   Gregory John, MD [facility] [phone]

## 2021-05-11 NOTE — Anesthesia Procedure Notes (Signed)
Arterial Line Insertion Start/End10/24/2022 10:10 AM, 05/11/2021 10:15 AM Performed by: Annye Asa, MD, Gwyndolyn Saxon, CRNA, CRNA  Patient location: OR. Preanesthetic checklist: patient identified, IV checked, site marked, risks and benefits discussed, surgical consent, monitors and equipment checked, pre-op evaluation, timeout performed and anesthesia consent Patient sedated Left, radial was placed Catheter size: 20 G Hand hygiene performed , maximum sterile barriers used  and Seldinger technique used Allen's test indicative of satisfactory collateral circulation Attempts: 2 Procedure performed without using ultrasound guided technique. Following insertion, dressing applied and Biopatch. Post procedure assessment: normal  Patient tolerated the procedure well with no immediate complications.

## 2021-05-11 NOTE — Anesthesia Postprocedure Evaluation (Signed)
Anesthesia Post Note  Patient: Gregory Eaton  Procedure(s) Performed: LEFT FEMORAL-BELOW KNEE POPLITEAL ARTERY BYPASS GRAFT WITH 6 mm PTFE and COMPLETION ANGIOGRAM (Left: Leg Upper)     Patient location during evaluation: PACU Anesthesia Type: General Level of consciousness: awake and alert, patient cooperative and oriented Pain management: pain level controlled Vital Signs Assessment: post-procedure vital signs reviewed and stable Respiratory status: spontaneous breathing, nonlabored ventilation, respiratory function stable and patient connected to nasal cannula oxygen Cardiovascular status: blood pressure returned to baseline and stable Postop Assessment: no apparent nausea or vomiting Anesthetic complications: no Comments: Pt required additional 2u PRBC in PACU, stable to floor   No notable events documented.  Last Vitals:  Vitals:   05/11/21 1530 05/11/21 1545  BP: (!) 103/53 (!) 98/48  Pulse: 82 80  Resp: 16 11  Temp: 36.6 C   SpO2: 95% 97%    Last Pain:  Vitals:   05/11/21 1530  TempSrc: Axillary  PainSc:                  Davion Meara,E. Jantzen Pilger

## 2021-05-11 NOTE — Anesthesia Procedure Notes (Signed)
Procedure Name: Intubation Date/Time: 05/11/2021 9:50 AM Performed by: Gwyndolyn Saxon, CRNA Pre-anesthesia Checklist: Patient identified, Emergency Drugs available, Suction available and Patient being monitored Patient Re-evaluated:Patient Re-evaluated prior to induction Oxygen Delivery Method: Circle system utilized Preoxygenation: Pre-oxygenation with 100% oxygen Induction Type: IV induction, Rapid sequence and Cricoid Pressure applied Laryngoscope Size: Miller and 2 Grade View: Grade I Tube type: Oral Tube size: 7.5 mm Number of attempts: 1 Airway Equipment and Method: Patient positioned with wedge pillow and Stylet Placement Confirmation: ETT inserted through vocal cords under direct vision, positive ETCO2 and breath sounds checked- equal and bilateral Secured at: 23 cm Tube secured with: Tape Dental Injury: Teeth and Oropharynx as per pre-operative assessment

## 2021-05-11 NOTE — Op Note (Signed)
NAME: Gregory Eaton    MRN: [id number] DOB: 1946/06/28    DATE OF OPERATION: 05/11/2021  PREOP DIAGNOSIS:    left lower extremity Rutherford 4 critical limb ischemia  POSTOP DIAGNOSIS:    Same  PROCEDURE:    Left-sided femoral to below-knee popliteal artery bypass Completion angiogram Redo tissue planes, difficult dissection modifier  SURGEON: [name]  ASSIST: Servando Snare, MD  ANESTHESIA: General  EBL: 1500 mL  INDICATIONS:   Gregory Eaton is a 75 year old male who originally presented with right lower extremity lifestyle limiting claudication.  Further imaging demonstrated critical stenosis left-sided femoral to below-knee popliteal artery bypass that was performed in 1993 by Dr. Ricard Dillon.  I originally tried to improve the right-sided lifestyle limiting claudication and left-sided bypass critical stenosis with a hybrid OR case , however I was unable to cross the 99% stenosis in the bypass.  I was able to improve inflow with iliac stenting and right-sided common femoral endarterectomy.  The original plan was for open bypass revision once the patient healed from this operation, however in the interim, the bypass thombosed.  Thrombosis of the bypass resulted in left lower extremity rest pain.  Gregory Eaton has been unable to sleep at night due to this pain.     Gregory Eaton does not have vein due to harvest for CABG and previous bypass. Physical exam unchanged from when he was seen in the office last Right lower extremity now asymptomatic after inflow stenting and femoral endarterectomy  After discussing the risks and benefits of redo femoral to below-knee popliteal artery bypass using 6 mm ringed PTFE for Rutherford 4 critical limb ischemia  Gregory Eaton elected to proceed.  FINDINGS:   Severe atherosclerotic disease of the left common femoral artery Review tissue planes at both the femoral and below-knee popliteal artery incisions Previous bypass-PTFE.  TECHNIQUE:   Patient was  brought to the OR laid in the supine position.  General anesthesia was induced and the patient was prepped and draped in standard fashion.  I began the dissection at the below-knee popliteal artery.  The scar from his previous surgeries were used as access points.  Upon entering the dermis of the below-knee popliteal artery, I encountered a large vein.  This was interesting, because I was told by the patient that his original left-sided below-knee popliteal artery surgery in 1993 was done using vein, and that the contralateral vein was used during a CABG.  An ultrasound was brought to the field and it appeared that the greater saphenous vein in the left leg was intact.  I continued the dissection to the below-knee popliteal artery.  The bypass and native artery were fused to the popliteal vein.  The bypass was not saphenous vein, but rather PTFE.  The dissection was difficult and took greater than 30 minutes more than normal below-knee popliteal artery exposure.  The artery was exposed through the TP trunk.  The anterior tibial vein was ligated using 3-0 silk to facilitate this.  Next, our attention turned to the femoral artery.  This was a difficult dissection as well, taking greater than 30 minutes more than usual due to the lack of tissue planes.  There was no superficial femoral artery appreciated.  The profunda was chronically occluded.  I could not appreciate the ostia the profunda which made me question if it was sewn closed at his index operation.  Once exposed, using ultrasound concerning the left-sided greater saphenous vein.  A small incision was made in an effort to visualize  and inspect the vein.  Upon further inspection.  The vein appeared much smaller, and did not appear of adequate size for bypass conduit.  I made the decision to continue with 6 mm ringed PTFE.  A tunneler was brought onto the field and tunneled from the below-knee popliteal artery to the femoral artery.  The patient was  heparinized with 10,000 units IV heparin and a longitudinal incision was made just distal to the previous anastomosis and the BK pop.  A 6-0 Prolene suture was used and the bypass was sewn in end to side fashion.  Next, the bypass graft was threaded through the bypass tunneler. The left leg was extended, bypass cut to length.  A longitudinal incision was made along the femoral artery with an 11 blade and the proximal femoral anastomosis was sewn in end-to-side fashion using 5-0 Prolene suture.  The bypass graft was flushed prior to completion of the anastomosis.  There is a palpable pulse in the foot.  There is significant bleeding from the groin and on further inspection there was a branch injury that was repaired using 5-0 Prolene suture.  Radiology was brought into the room and the lesion left leg angiography followed.  This demonstrated a widely patent proximal and distal anastomosis with no kinks in the bypass graft.  There was three-vessel runoff to the foot.,  Anterior tibial, posterior tibial artery dominant.  Once I was sure there were no issues with the bypass graft, heparin was reversed with protamine.  Hemostasis was achieved with the use of thrombin product, cautery, suture.  Wounds were irrigated with warm saline and closed in Vicryl suture layers.  The femoral and greater saphenous vein sites were closed using Monocryl suture at the skin.  The below-knee popliteal artery had granulation tissue and oozing from the redo nature of the operation.  Therefore, I elected to close with staples with light compression using an Ace wrap.  Patient was taken to PACU in stable condition. Palpable anterior tibial artery.   Given the complexity of the case a first assistant was necessary in order to expedient the procedure and safely perform the technical aspects of the operation.  Macie Burows, MD [facility]  DATE OF DICTATION:   05/11/2021

## 2021-05-11 NOTE — Anesthesia Preprocedure Evaluation (Signed)
Anesthesia Evaluation  Patient identified by MRN, date of birth, ID band Patient awake    Reviewed: Allergy & Precautions, NPO status , Patient's Chart, lab work & pertinent test results, reviewed documented beta blocker date and time   History of Anesthesia Complications Negative for: history of anesthetic complications  Airway Mallampati: II  TM Distance: >3 FB Neck ROM: Full    Dental  (+) Dental Advisory Given   Pulmonary COPD, former smoker,  05/08/2021 SARS coronavirus NEG   breath sounds clear to auscultation       Cardiovascular hypertension, Pt. on medications and Pt. on home beta blockers + CAD and + CABG   Rhythm:Regular Rate:Normal  01/2021 Stress: ? The left ventricular ejection fraction is normal (55-65%). ? Nuclear stress EF: 65%. ? There was no ST segment deviation noted during stress. ? No T wave inversion was noted during stress. ? Defect 1: There is a small defect of mild severity present in the apical inferior location. ? Findings consistent with a small region of ischemia. ? This is a low risk study. 12/2020 ECHO: 55 to 60%. The  left ventricle has normal function. The left ventricle has no regional  wall motion abnormalities. There is mild left ventricular hypertrophy.  Left ventricular diastolic parameters  are consistent with Grade I DD, no significant valvular abnormalities   Neuro/Psych negative neurological ROS     GI/Hepatic Neg liver ROS, GERD  Poorly Controlled,Esophageal cancer   Endo/Other  diabetes (glu 102)  Renal/GU negative Renal ROS     Musculoskeletal   Abdominal   Peds  Hematology negative hematology ROS (+)   Anesthesia Other Findings   Reproductive/Obstetrics                             Anesthesia Physical Anesthesia Plan  ASA: 3  Anesthesia Plan: General   Post-op Pain Management:    Induction: Intravenous  PONV Risk Score and Plan: 2  and Ondansetron and Dexamethasone  Airway Management Planned: Oral ETT  Additional Equipment: Arterial line  Intra-op Plan:   Post-operative Plan: Extubation in OR  Informed Consent: I have reviewed the patients History and Physical, chart, labs and discussed the procedure including the risks, benefits and alternatives for the proposed anesthesia with the patient or authorized representative who has indicated his/her understanding and acceptance.     Dental advisory given  Plan Discussed with: CRNA and Surgeon  Anesthesia Plan Comments: (A-line for sampling )        Anesthesia Quick Evaluation

## 2021-05-11 NOTE — Progress Notes (Signed)
  Progress Note    05/11/2021 2:55 PM Day of Surgery  Subjective:  seen in PACU. Resting comfortably   Vitals:   05/11/21 1430 05/11/21 1445  BP: (!) 95/53 (!) 109/42  Pulse: 76 82  Resp: 12 11  Temp:  97.7 F (36.5 C)  SpO2: 97% 99%   Physical Exam: Cardiac:  regular Lungs:  non labored Incisions:  left groin, left thigh and leg incisions are clean, dry and intact without swelling or hematoma Extremities:  LLE well perfused and warm with brisk doppler AT Abdomen:  flat, soft, non tender Neurologic: alert and oriented  CBC    Component Value Date/Time   WBC 7.3 05/11/2021 0724   RBC 4.36 05/11/2021 0724   HGB 12.5 (L) 05/11/2021 0724   HCT 39.7 05/11/2021 0724   PLT 309 05/11/2021 0724   MCV 91.1 05/11/2021 0724   MCH 28.7 05/11/2021 0724   MCHC 31.5 05/11/2021 0724   RDW 13.0 05/11/2021 0724   LYMPHSABS 1.2 01/01/2021 1405   MONOABS 0.9 01/01/2021 1405   EOSABS 0.1 01/01/2021 1405   BASOSABS 0.1 01/01/2021 1405    BMET    Component Value Date/Time   NA 140 05/11/2021 0724   K 4.1 05/11/2021 0724   CL 106 05/11/2021 0724   CO2 23 05/11/2021 0724   GLUCOSE 96 05/11/2021 0724   BUN 16 05/11/2021 0724   CREATININE 0.79 05/11/2021 0724   CREATININE 0.79 01/31/2013 0940   CALCIUM 9.3 05/11/2021 0724   GFRNONAA >60 05/11/2021 0724   GFRAA >60 01/22/2019 1430    INR    Component Value Date/Time   INR 1.1 05/11/2021 0724     Intake/Output Summary (Last 24 hours) at 05/11/2021 1455 Last data filed at 05/11/2021 1403 Gross per 24 hour  Intake 3930 ml  Output 1690 ml  Net 2240 ml     Assessment/Plan:  75 y.o. male is s/p left fem to bk popliteal bypass with PTFE  Day of Surgery   Patient doing well post op LLE well perfused and warm with brisk AT doppler signal Left groin and leg incisions are clean, dry and intact without swelling or hematoma Transfusing 1 Unit PRBC  To 4E when bed available   Karoline Caldwell, PA-C [facility] [phone] 05/11/2021 2:55 PM

## 2021-05-11 NOTE — Progress Notes (Signed)
Patient brought to 4E from PACU. VSS. Telemetry box applied, CCMD notified.CHG bath completed.Patient oriented to room and staff. Call bell in reach.  Cuyler Vandyken L Gwynne Kemnitz, RN  

## 2021-05-12 ENCOUNTER — Encounter (HOSPITAL_COMMUNITY): Payer: Self-pay | Admitting: Vascular Surgery

## 2021-05-12 LAB — CBC
HCT: 31.5 % — ABNORMAL LOW (ref 39.0–52.0)
Hemoglobin: 10.9 g/dL — ABNORMAL LOW (ref 13.0–17.0)
MCH: 31.1 pg (ref 26.0–34.0)
MCHC: 34.6 g/dL (ref 30.0–36.0)
MCV: 89.7 fL (ref 80.0–100.0)
Platelets: 165 10*3/uL (ref 150–400)
RBC: 3.51 MIL/uL — ABNORMAL LOW (ref 4.22–5.81)
RDW: 14.1 % (ref 11.5–15.5)
WBC: 15.3 10*3/uL — ABNORMAL HIGH (ref 4.0–10.5)
nRBC: 0 % (ref 0.0–0.2)

## 2021-05-12 LAB — POCT I-STAT, CHEM 8
BUN: 22 mg/dL (ref 8–23)
BUN: 15 mg/dL (ref 8–23)
Calcium, Ion: 1.22 mmol/L (ref 1.15–1.40)
Calcium, Ion: 1.18 mmol/L (ref 1.15–1.40)
Chloride: 107 mmol/L (ref 98–111)
Chloride: 106 mmol/L (ref 98–111)
Creatinine, Ser: 0.7 mg/dL (ref 0.61–1.24)
Creatinine, Ser: 0.7 mg/dL (ref 0.61–1.24)
Glucose, Bld: 164 mg/dL — ABNORMAL HIGH (ref 70–99)
Glucose, Bld: 212 mg/dL — ABNORMAL HIGH (ref 70–99)
HCT: 24 % — ABNORMAL LOW (ref 39.0–52.0)
HCT: 23 % — ABNORMAL LOW (ref 39.0–52.0)
Hemoglobin: 8.2 g/dL — ABNORMAL LOW (ref 13.0–17.0)
Hemoglobin: 7.8 g/dL — ABNORMAL LOW (ref 13.0–17.0)
Potassium: 6.2 mmol/L — ABNORMAL HIGH (ref 3.5–5.1)
Potassium: 4.8 mmol/L (ref 3.5–5.1)
Sodium: 139 mmol/L (ref 135–145)
Sodium: 139 mmol/L (ref 135–145)
TCO2: 26 mmol/L (ref 22–32)
TCO2: 23 mmol/L (ref 22–32)

## 2021-05-12 LAB — LIPID PANEL
Cholesterol: 69 mg/dL (ref 0–200)
HDL: 30 mg/dL — ABNORMAL LOW (ref >40)
LDL Cholesterol: 27 mg/dL (ref 0–99)
Total CHOL/HDL Ratio: 2.3 RATIO
Triglycerides: 58 mg/dL (ref <150)
VLDL: 12 mg/dL (ref 0–40)

## 2021-05-12 LAB — BASIC METABOLIC PANEL
Anion gap: 9 (ref 5–15)
BUN: 19 mg/dL (ref 8–23)
CO2: 20 mmol/L — ABNORMAL LOW (ref 22–32)
Calcium: 7.8 mg/dL — ABNORMAL LOW (ref 8.9–10.3)
Chloride: 106 mmol/L (ref 98–111)
Creatinine, Ser: 0.85 mg/dL (ref 0.61–1.24)
GFR, Estimated: >60 mL/min (ref >60)
Glucose, Bld: 148 mg/dL — ABNORMAL HIGH (ref 70–99)
Potassium: 4.1 mmol/L (ref 3.5–5.1)
Sodium: 135 mmol/L (ref 135–145)

## 2021-05-12 LAB — GLUCOSE, CAPILLARY
Glucose-Capillary: 137 mg/dL — ABNORMAL HIGH (ref 70–99)
Glucose-Capillary: 168 mg/dL — ABNORMAL HIGH (ref 70–99)
Glucose-Capillary: 132 mg/dL — ABNORMAL HIGH (ref 70–99)
Glucose-Capillary: 132 mg/dL — ABNORMAL HIGH (ref 70–99)

## 2021-05-12 LAB — POCT ACTIVATED CLOTTING TIME
Activated Clotting Time: 231 seconds
Activated Clotting Time: 98 seconds

## 2021-05-12 NOTE — Progress Notes (Signed)
  Daily Progress Note  S/p:1 Day Post-Op left femoral to below-knee popliteal artery bypass with 35mm PTFE Previous surgeries: Right CFEA, bilateral CIA, EIA stenting, L fem-bkpop bypass  Subjective: Resting comfortably this morning No complaints Sensory and motor intact in the foot.   Objective: Vitals:   05/11/21 2359 05/12/21 0339  BP: (!) 105/50 (!) 103/47  Pulse: 79 82  Resp: 14 12  Temp: 98.2 F (36.8 C) 98.6 F (37 C)  SpO2:  96%    Physical Examination  Left leg Femoral and BK-pop incision well-opposed, healing appropriately Soft behind the knee, no hematoma Triphasic DP, monophasic PT   ASSESSMENT/PLAN:  Pt doing well s/p bypass. Progressing appropriately PT/ OT, OOB today TED hose to groin on left leg  Multimodal pain control  Daily labs    Cassandria Santee MD MS [facility] [phone] 05/12/2021  7:05 AM

## 2021-05-12 NOTE — Progress Notes (Signed)
OT Cancellation Note  Patient Details Name: Gregory Eaton MRN: [id number] DOB: Jul 18, 1946   Cancelled Treatment:    Reason Eval/Treat Not Completed: OT screened, no needs identified, will sign off Per PT, pt Independent with ADLs/mobility with no need for formal OT eval at this time.   Layla Maw 05/12/2021, 10:30 AM

## 2021-05-12 NOTE — Progress Notes (Signed)
PHARMACIST LIPID MONITORING   Gregory Eaton is a 75 y.o. male admitted on 05/11/2021 with PVD.  Pharmacy has been consulted to optimize lipid-lowering therapy with the indication of secondary prevention for clinical ASCVD.  Recent Labs:  Lipid Panel (last 6 months):   Lab Results  Component Value Date   CHOL 69 05/12/2021   TRIG 58 05/12/2021   HDL 30 (L) 05/12/2021   CHOLHDL 2.3 05/12/2021   VLDL 12 05/12/2021   LDLCALC 27 05/12/2021    Hepatic function panel (last 6 months):   Lab Results  Component Value Date   AST 19 05/11/2021   ALT 15 05/11/2021   ALKPHOS 79 05/11/2021   BILITOT 0.5 05/11/2021    SCr (since admission):   Serum creatinine: 0.85 mg/dL 05/12/21 0500 Estimated creatinine clearance: 71.5 mL/min  Current therapy and lipid therapy tolerance Current lipid-lowering therapy: Crestor 20mg /day Documented or reported allergies or intolerances to lipid-lowering therapies (if applicable): none   Plan:    1.Statin intensity (high intensity recommended for all patients regardless of the LDL):  No statin changes. The patient is already on a high intensity statin.  2.Add ezetimibe (if any one of the following):   Not indicated at this time.  3.Refer to lipid clinic:   No  4.Follow-up with:  Primary care provider - Corrington, Kip A, MD  5.Follow-up labs after discharge:  No changes in lipid therapy, repeat a lipid panel in one year.     Hildred Laser, PharmD Clinical Pharmacist **Pharmacist phone directory can now be found on [email or web address] ([email or web address]).  Listed under [place].

## 2021-05-12 NOTE — Progress Notes (Signed)
Mobility Specialist: Progress Note   05/12/21 1555  Mobility  Activity Ambulated in [place]  Level of Assistance Independent  Assistive Device None  Distance Ambulated (ft) 450 ft  Mobility Ambulated independently in hallway  Mobility Response Tolerated well  Mobility performed by Mobility specialist  $Mobility charge 1 Mobility   Post-Mobility: 87 HR  Pt to BR and then agreeable to ambulation. Pt c/o LLE stiffness at the beginning of ambulation, resolved with distance. Pt otherwise asymptomatic. Pt back to bed after walk with call bell at his side and pt's wife present in the room.   [facility] Rissa Turley Mobility Specialist Mobility Specialist Phone: [phone]

## 2021-05-12 NOTE — Evaluation (Signed)
Physical Therapy Evaluation & Discharge Patient Details Name: Gregory Eaton MRN: [id number] DOB: 1945/11/12 Today's Date: 05/12/2021  History of Present Illness  Pt is a 75 y.o. male admitted 05/11/21 with LLE claudication pain, s/p L femoral to below-knee popliteal bypass graft. PMH includes PVD, HTN, HLD, emphysema, CAD, arthritis, COPD, tobacco abuse; recent BLE vascular intervention 03/2021.   Clinical Impression  Patient evaluated by Physical Therapy with no further acute PT needs identified. PTA, pt independent without DME, lives with wife. Today, pt independent with mobility and ADL tasks. Pt with expected post-op pain and stiffness, but notes pain improved since PTA. Educ re: activity recommendations, importance of mobility. All education has been completed and the patient has no further questions. Acute PT is signing off. Thank you for this referral.       Recommendations for follow up therapy are one component of a multi-disciplinary discharge planning process, led by the attending physician.  Recommendations may be updated based on patient status, additional functional criteria and insurance authorization.  Follow Up Recommendations No PT follow up    Assistance Recommended at Discharge None  Functional Status Assessment    Equipment Recommendations  None recommended by PT    Recommendations for Other Services       Precautions / Restrictions Precautions Precautions: Fall Restrictions Weight Bearing Restrictions: No      Mobility  Bed Mobility Overal bed mobility: Independent                  Transfers Overall transfer level: Independent Equipment used: None                    Ambulation/Gait Ambulation/Gait assistance: Modified independent (Device/Increase time) Gait Distance (Feet): 450 Feet Assistive device: None Gait Pattern/deviations: Step-through pattern;Decreased stride length;Antalgic Gait velocity: Decreased   General Gait Details:  Slow, antalgic gait without DME, mod indep for increased time; pt reports improving stiffness with ambulation distance; no overt instability or LOB noted  Stairs            Wheelchair Mobility    Modified Rankin (Stroke Patients Only)       Balance Overall balance assessment: Independent   Sitting balance-Leahy Scale: Good Sitting balance - Comments: able to don/doff bilateral socks sitting edge of recliner     Standing balance-Leahy Scale: Good                               Pertinent Vitals/Pain Pain Assessment: Faces Faces Pain Scale: Hurts a little bit Pain Location: LLE incisions Pain Descriptors / Indicators: Guarding;Sore Pain Intervention(s): Monitored during session    Home Living Family/patient expects to be discharged to:: Private residence Living Arrangements: Spouse/significant other Available Help at Discharge: Family;Available 24 hours/day Type of Home: House Home Access: Ramped entrance       Home Layout: One level Home Equipment: Conservation officer, nature (2 wheels);Cane - single point;BSC;Shower seat;Hand held shower head      Prior Function Prior Level of Function : Independent/Modified Independent             Mobility Comments: Independent without DME. Enjoys yard work, mowing, Therapist, music work on vehicles, "I'm a workaholic" ADLs Comments: Independent with ADLs; stand to shower     Hand Dominance   Dominant Hand: Right    Extremity/Trunk Assessment   Upper Extremity Assessment Upper Extremity Assessment: Overall WFL for tasks assessed    Lower Extremity Assessment Lower Extremity Assessment: LLE  deficits/detail LLE Deficits / Details: s/p LLE revascularization with post-op pain and weakness; functionally >/ 3/5 strength       Communication   Communication: No difficulties  Cognition Arousal/Alertness: Awake/alert Behavior During Therapy: WFL for tasks assessed/performed Overall Cognitive Status: Within Functional Limits  for tasks assessed                                          General Comments General comments (skin integrity, edema, etc.): HR 90s    Exercises     Assessment/Plan    PT Assessment Patient does not need any further PT services  PT Problem List         PT Treatment Interventions      PT Goals (Current goals can be found in the Care Plan section)  Acute Rehab PT Goals PT Goal Formulation: All assessment and education complete, DC therapy    Frequency     Barriers to discharge        Co-evaluation               AM-PAC PT "6 Clicks" Mobility  Outcome Measure Help needed turning from your back to your side while in a flat bed without using bedrails?: None Help needed moving from lying on your back to sitting on the side of a flat bed without using bedrails?: None Help needed moving to and from a bed to a chair (including a wheelchair)?: None Help needed standing up from a chair using your arms (e.g., wheelchair or bedside chair)?: None Help needed to walk in hospital room?: None Help needed climbing 3-5 steps with a railing? : None 6 Click Score: 24    End of Session   Activity Tolerance: Patient tolerated treatment well Patient left: with call bell/phone within reach;in chair Nurse Communication: Mobility status PT Visit Diagnosis: Other abnormalities of gait and mobility (R26.89);Pain    Time: 0911-0930 PT Time Calculation (min) (ACUTE ONLY): 19 min   Charges:   PT Evaluation $PT Eval Low Complexity: Vera, PT, DPT Acute Rehabilitation Services  Pager [phone] Office [phone]'  Derry Lory 05/12/2021, 9:41 AM

## 2021-05-13 LAB — GLUCOSE, CAPILLARY
Glucose-Capillary: 113 mg/dL — ABNORMAL HIGH (ref 70–99)
Glucose-Capillary: 122 mg/dL — ABNORMAL HIGH (ref 70–99)

## 2021-05-13 MED ORDER — OXYCODONE-ACETAMINOPHEN 5-325 MG PO TABS: 1.0000 | ORAL_TABLET | ORAL | 0 refills | Status: DC | PRN

## 2021-05-13 MED ORDER — ROSUVASTATIN CALCIUM 20 MG PO TABS: 20.0000 mg | ORAL_TABLET | Freq: Every day | ORAL | 3 refills | Status: DC

## 2021-05-13 NOTE — Progress Notes (Signed)
  Daily Progress Note  S/p:2 Days Post-Op left femoral to below-knee popliteal artery bypass with 54mm PTFE Previous surgeries: Right CFEA, bilateral CIA, EIA stenting, L fem-bkpop bypass  Subjective: Resting comfortably this morning. Rest pain in left foot resolved. No complaints Sensory and motor intact in the foot.   Objective: Vitals:   05/13/21 0435 05/13/21 0436  BP: 97/73 97/73  Pulse: 89 88  Resp: 17 16  Temp: 97.8 F (36.6 C) 97.8 F (36.6 C)  SpO2: 92%     Physical Examination  Left leg Femoral and BK-pop incision well-opposed, healing appropriately Soft behind the knee, no hematoma Triphasic DP (palpable), monophasic PT Right groin - old incision healing well Multiphasic DP signal   ASSESSMENT/PLAN:  Likely home today after lunch pending continued clinical improvement TED hose to groin on left leg  OOB today Multimodal pain control   Will see in the office in 1 month, ABI, Aortoiliac duplex ultrasound, LLE duplex ultrasound.    Cassandria Santee MD MS [facility] [phone] 05/13/2021  7:26 AM

## 2021-05-13 NOTE — Progress Notes (Signed)
Mobility Specialist: Progress Note   05/13/21 1212  Mobility  Activity Ambulated in [place]  Level of Assistance Independent  Assistive Device None  Distance Ambulated (ft) 450 ft  Mobility Ambulated independently in hallway  Mobility Response Tolerated well  Mobility performed by Mobility specialist  Bed Position Chair  $Mobility charge 1 Mobility   Pre-Mobility: 91 HR Post-Mobility: 87 HR  Pt c/o stiffness in LLE towards the beginning of ambulation, resolved with distance. Pt otherwise asymptomatic. Pt to recliner after walk with call bell and phone in reach.   [facility] Jamiria Langill Mobility Specialist Mobility Specialist Phone: [phone]

## 2021-05-13 NOTE — Progress Notes (Signed)
D/C instructions given to pt. Ted hose sent home. Medications and wound care reviewed. All questions answered. IV removed, clean and intact. Wife to escort pt home.  Clyde Canterbury, RN

## 2021-05-14 LAB — BPAM RBC
Blood Product Expiration Date: 202211192359
Blood Product Expiration Date: 202211192359
Blood Product Expiration Date: 202211192359
Blood Product Expiration Date: 202211192359
Blood Product Expiration Date: 202211192359
Blood Product Expiration Date: 202211192359
ISSUE DATE / TIME: 202210241219
ISSUE DATE / TIME: 202210241219
ISSUE DATE / TIME: 202210241337
ISSUE DATE / TIME: 202210241337
Unit Type and Rh: 5100
Unit Type and Rh: 5100
Unit Type and Rh: 5100
Unit Type and Rh: 5100
Unit Type and Rh: 5100
Unit Type and Rh: 5100

## 2021-05-14 LAB — TYPE AND SCREEN
ABO/RH(D): O POS
Antibody Screen: NEGATIVE
Unit division: 0
Unit division: 0
Unit division: 0
Unit division: 0
Unit division: 0
Unit division: 0

## 2021-05-15 ENCOUNTER — Telehealth: Payer: Self-pay

## 2021-05-15 NOTE — Telephone Encounter (Signed)
Patient calls today to report a water blister on his left ankle. Says he noticed it today when he got out of the shower. No pain involved, no redness - says it is nowhere close to any incisions and looks just like a water blister. He is keeping it clean and dry and covered with a gauze. Advised him it was okay to continue to wear his TED hose. Patient verbalizes understanding.

## 2021-05-18 NOTE — Discharge Summary (Signed)
Bypass Discharge Summary Patient ID: Gregory Eaton [id number] 75 y.o. 05/14/1946  Admit date: 05/11/2021  Discharge date and time: 05/13/2021  2:48 PM   Admitting Physician: Gregory John, MD   Discharge Physician: Gregory John, MD  Admission Diagnoses: PAD (peripheral artery disease) [facility]) [I73.9]  Discharge Diagnoses: left lower extremity Rutherford 4 critical limb ischemia; PAD; acute blood loss anemia    Admission Condition: good  Discharged Condition: good  Indication for Admission: 75 year old male who originally presented with right lower extremity lifestyle limiting claudication.  Further imaging demonstrated critical stenosis left-sided femoral to below-knee popliteal artery bypass that was performed in 1993 by Dr. Ricard Eaton.  I originally tried to improve the right-sided lifestyle limiting claudication and left-sided bypass critical stenosis with a hybrid OR case , however I was unable to cross the 99% stenosis in the bypass.  I was able to improve inflow with iliac stenting and right-sided common femoral endarterectomy.  The original plan was for open bypass revision once the patient healed from this operation, however in the interim, the bypass thombosed.  Thrombosis of the bypass resulted in left lower extremity rest pain.  Gregory Eaton has been unable to sleep at night due to this pain.     Gregory Eaton does not have vein due to harvest for CABG and previous bypass. Physical exam unchanged from when he was seen in the office last Right lower extremity now asymptomatic after inflow stenting and femoral endarterectomy  After discussing the risks and benefits of redo femoral to below-knee popliteal artery bypass using 6 mm ringed PTFE for Rutherford 4 critical limb ischemia  Gregory Eaton elected to proceed.    Hospital Course: On the day of admission the patient was taken to the operating room where he underwent left-sided femoral to below-knee popliteal artery bypass and completion  angiogram.  He tolerated the procedure well and was taken to recovery room in satisfactory condition.  In the recovery room, he had a brisk dopplerable AT pulse. On postoperative day 1, he had a triphasic dorsalis pedis pulse and a monophasic posterior tibial pulse.  He had no evidence of bleeding and his vital signs were stable.  His hemoglobin drifted to 7.8 and due to intraoperative blood losses, he was transfused a total of 4 units packed cells.  His hemoglobin responded appropriately.  On postoperative day 2, physical therapy and Occupational Therapy consultations were obtained.  He was doing well ambulating and no follow-up recommended.  He was voiding spontaneously and tolerating his diet.  His pain was well controlled.  He continued mobilization and was discharged later that afternoon.  Consults: None  Treatments: surgery: See above   Disposition: Discharge disposition: 01-Home or Self Care       - For Gregory Eaton Hospital Registry use ---  Post-op:  Wound infection: No  Graft infection: No  Transfusion: Yes  If yes, 4 units given New Arrhythmia: No Patency judged by: [ x] Dopper only, [ ]  Palpable graft pulse, [ ]  Palpable distal pulse, [ ]  ABI inc. > 0.15, [ ]  Duplex Discharge ABI: R , L  Discharge TBI: R , L see pre-op values D/C Ambulatory Status: Ambulatory  Complications: MI: [ ]  No, [ ]  Troponin only, [ ]  EKG or Clinical CHF: No Resp failure: [ ]  none, [ ]  Pneumonia, [ ]  Ventilator Chg in renal function: [ ]  none, [ ]  Inc. Cr > 0.5, [ ]  Temp. Dialysis, [ ]  Permanent dialysis Stroke: [ ]  None, [ ]  Minor, [ ]  Major Return  to OR: No  Reason for return to OR: [ ]  Bleeding, [ ]  Infection, [ ]  Thrombosis, [ ]  Revision  Discharge medications: Statin use:  Yes ASA use:  Yes Plavix use:  Yes Beta blocker use: Yes Coumadin use: No  for medical reason not indicated    Patient Instructions:  Allergies as of 05/13/2021       Reactions   Altace [ramipril] Cough         Medication List     TAKE these medications    acetaminophen 500 MG tablet Commonly known as: TYLENOL Take 1,000 mg by mouth daily as needed for headache.   albuterol (2.5 MG/3ML) 0.083% nebulizer solution Commonly known as: PROVENTIL Take 3 mLs (2.5 mg total) by nebulization every 6 (six) hours as needed for wheezing or shortness of breath. DX: J44.9   ALPRAZolam 0.5 MG tablet Commonly known as: XANAX Take 1 tablet (0.5 mg total) by mouth at bedtime.   amLODipine 10 MG tablet Commonly known as: NORVASC TAKE ONE (1) TABLET BY MOUTH EVERY DAY   aspirin EC 81 MG tablet Take 1 tablet (81 mg total) by mouth daily.   baclofen 10 MG tablet Commonly known as: LIORESAL Take 1 tablet (10 mg total) by mouth 3 (three) times daily as needed for muscle spasms.   Bevespi Aerosphere 9-4.8 MCG/ACT Aero Generic drug: Glycopyrrolate-Formoterol Inhale 2 puffs into the lungs 2 (two) times daily. What changed:  when to take this reasons to take this   CENTRUM SILVER PO Take 1 tablet by mouth daily.   clopidogrel 75 MG tablet Commonly known as: PLAVIX Take 1 tablet (75 mg total) by mouth daily. TAKE 1 TABLET BY MOUTH EVERY DAY   dexlansoprazole 60 MG capsule Commonly known as: Dexilant Take 1 capsule (60 mg total) by mouth as needed (takes 2-3 times per week).   guaiFENesin 600 MG 12 hr tablet Commonly known as: MUCINEX Take 600 mg by mouth every 12 (twelve) hours as needed (cough).   latanoprost 0.005 % ophthalmic solution Commonly known as: XALATAN Place 1 drop into both eyes at bedtime.   losartan 100 MG tablet Commonly known as: COZAAR TAKE ONE TABLET BY MOUTH EVERY NIGHT AT BEDTIME   metoprolol succinate 50 MG 24 hr tablet Commonly known as: TOPROL-XL TAKE ONE (1) TABLET BY MOUTH EVERY DAY   montelukast 10 MG tablet Commonly known as: SINGULAIR Take 1 tablet (10 mg total) by mouth daily.   nitroGLYCERIN 0.4 MG SL tablet Commonly known as: Nitrostat Place 1 tablet  (0.4 mg total) under the tongue every 5 (five) minutes as needed for chest pain.   oxyCODONE-acetaminophen 5-325 MG tablet Commonly known as: PERCOCET/ROXICET Take 1 tablet by mouth every 4 (four) hours as needed for moderate pain. What changed: when to take this   rosuvastatin 20 MG tablet Commonly known as: CRESTOR Take 1 tablet (20 mg total) by mouth daily. What changed:  medication strength how much to take       Activity: no lifting, driving, or strenuous exercise for 2 weeks Diet: low fat, low cholesterol diet Wound Care: keep wound clean and dry  Follow-up with Dr. Virl Cagey in 2 weeks.  Signed: Edman Circle Lesslie Mckeehan 05/18/2021 11:42 AM

## 2021-05-25 ENCOUNTER — Ambulatory Visit (INDEPENDENT_AMBULATORY_CARE_PROVIDER_SITE_OTHER): Payer: Medicare Other | Admitting: Vascular Surgery

## 2021-05-25 ENCOUNTER — Other Ambulatory Visit: Payer: Self-pay

## 2021-05-25 ENCOUNTER — Telehealth: Payer: Self-pay

## 2021-05-25 DIAGNOSIS — Z95828 Presence of other vascular implants and grafts: Secondary | ICD-10-CM

## 2021-05-25 NOTE — Progress Notes (Signed)
Office Note      HPI: Gregory Eaton is a 75 y.o. (Sep 04, 1945) male presenting in follow up s/p 05/11/21 left femoral to below-knee popliteal artery bypass w/ ringed PTFE.  Gregory Eaton has had multiple vascular surgery interventions including 03/2021 right iliofemoral endarterectomy, bilateral common iliac artery stenting, bilateral external iliac artery stenting. Patient also had a previous greater saphenous vein bypass in 1993 with Dr. [name].  Gregory Eaton presents today after noticing an odor and his distal incision site.  This appeared erythematous around the staples, which was concerning to Gregory Eaton.  On exam, Gregory Eaton was doing well.  Gregory Eaton denied fevers, chills, new onset pain in the left leg.  The erythema has been present for a week and had not worsened.  The smell that was appreciated, was no longer present, and Gregory Eaton Gregory Eaton is not sure as to whether that was his compression stocking or the wound itself.  Gregory Eaton denied drainage.  The pt is  on a statin for cholesterol management.  The pt is  on a daily aspirin.   Other AC:  plavix The pt is  on medication for hypertension.   The pt is not diabetic.  Tobacco hx:  former  Past Medical History:  Diagnosis Date   Adenomatous colon polyp 03/2009   Last colonoscopy by Dr. [name]    Adenomatous polyp 2010   Adenomatous polyp of colon 11/03/2010   Arthritis    Barrett's esophagus    CAD (coronary artery disease)    Complication of anesthesia    has a shortened esophogus due to CA.    COPD (chronic obstructive pulmonary disease) (HCC)    Severe emphysema per CT   Diverticulosis    Emphysema of lung (HCC)    Esophageal carcinoma ([place]) 03/2009   T1N1M0   GERD (gastroesophageal reflux disease)    Glaucoma    History of Doppler ultrasound 11/09/2011   03/2014- 50-69% L ICA stenosis;carotid doppler; L bulb/prox ICA 0-49% diameter reduction; L vertebral artery - occlusive ds; L ECA  demonstrates severe amount of fibrous plaque   History of Doppler ultrasound  11/09/2011   LEAs; R ABI - mod art. insuff.; L ABI normal at rest; R SFA - occlusive ds; L SFA - occlusive ds; patent fem-pop graft   History of echocardiogram 08/27/2009   EF >55%   History of hiatal hernia    History of kidney stones 1965   History of nuclear stress test 11/24/2011   lexiscan; normal perfusion; low risk scan; non-diagnostic for ischemia   Hyperlipidemia    Hypertension    Left carotid artery stenosis 04/08/2014   Pneumonia    Pre-diabetes    Pulmonary nodule, right 04/08/2014   2.8 mm-incidental finding on CT   PVD (peripheral vascular disease) ([place])    Tachyarrhythmia 1999   Status post ablation at [facility]   Tobacco abuse     Past Surgical History:  Procedure Laterality Date   ABDOMINAL AORTOGRAM W/LOWER EXTREMITY N/A 04/01/2021   Procedure: ABDOMINAL AORTOGRAM W/LOWER EXTREMITY;  Surgeon: [name], MD;  Location: [place] [facility];  Service: Cardiovascular;  Laterality: N/A;   BIOPSY  10/24/2017   Procedure: BIOPSY;  Surgeon: [name], MD;  [facility];  Service: Endoscopy;;  esophagus   CATARACT EXTRACTION Bilateral    COLONOSCOPY  03/17/2009   Dr.Rourk- normal rectum, sigmoid diverticula, some pale sigmoid mucosa with diffuse petechiae. pedunculated polyp at the splenic flexure, remainder of colonic mucosa appeared normal. bx= adenomatous polyp  COLONOSCOPY  04/19/2003   Dr.Rehman- few diverticu;a at the sigmoid colon, 3 small polyps, one at the transverse colon and 2 at the sigmoid, small external hemorrhoids. bx report not available.    COLONOSCOPY WITH PROPOFOL N/A 10/24/2017   Dr. [name]: Diverticulosis, 11 mm polyp at the ileocecal valve, tubular adenoma.  Repeat colonoscopy in 3 years   CORONARY ARTERY BYPASS GRAFT  1998   [name]   ENDARTERECTOMY FEMORAL Right 04/13/2021   Procedure: RIGHT ILIOFEMORAL ARTERY ENDARTERECTOMY WITH PATCH ANGIOPLASTY USING HEMASHIELD [place];  Surgeon: [name],  MD;  Location: [place];  Service: Vascular;  Laterality: Right;   ESOPHAGECTOMY  2010   [facility] Dr. [name]   ESOPHAGOGASTRODUODENOSCOPY  11/25/2010   Dr.Rourk- s/p esophagectomy with gastric pull-up, esophageal erosions straddling the surgical anastomosis, salmon colored epithelium coming up a good centimeter to a centimeter and a half above the suture line, islands of salmon colored epithelium in the most poximal residual esophagus, remainder of gastric mucosa appeared normal. bx= swamous &gastric glandular mucosa w/chronic active inflammation   ESOPHAGOGASTRODUODENOSCOPY  03/17/2009   Dr.Rourk- 4cm segment of salmon-colored epithlium distal esophagus suspicious for barretts esophagus. area of suspicious nodularity w/in this segment bx seperately. small to moderate size hiatal hernia, o/w normal stomach D1 and D2 bx=adenocarcinoma   ESOPHAGOGASTRODUODENOSCOPY (EGD) WITH PROPOFOL N/A 10/24/2017   Dr. [name]: Remnant of esophagus with anastomosis with stomach at 24 cm from the incisors, 2 cm above the anastomosis Gregory Eaton was found to have Barrett's but no dysplasia.  Advised for repeat EGD in 3 years   EYE SURGERY     cataract   FEMORAL-POPLITEAL BYPASS GRAFT  1993   occlusive ds in R SFA   FEMORAL-POPLITEAL BYPASS GRAFT Left 05/11/2021   Procedure: LEFT FEMORAL-BELOW KNEE POPLITEAL ARTERY BYPASS GRAFT WITH 6 mm PTFE and COMPLETION ANGIOGRAM;  Surgeon: [name], MD;  Location: [place];  Service: Vascular;  Laterality: Left;   GASTRIC PULL THROUGH  2010   With esophagectomy   INSERTION OF ILIAC STENT Bilateral 04/13/2021   Procedure: INSERTION OF BILATERAL COMMON ILIAC KISSING STENTS AND INSERION OF RIGHT EXTERNAL ILIAC STENT;  Surgeon: [name], MD;  Location: [place];  Service: Vascular;  Laterality: Bilateral;   JOINT REPLACEMENT     LIPOMA EXCISION  2010   LOWER EXTREMITY ANGIOGRAM Left 04/13/2021   Procedure: LEFT LOWER EXTREMITY ANGIOGRAM ;  Surgeon: [name], MD;  Location:  [place];  Service: Vascular;  Laterality: Left;   PERIPHERAL VASCULAR INTERVENTION  04/01/2021   Procedure: PERIPHERAL VASCULAR INTERVENTION;  Surgeon: [name], MD;  Location: [place] [facility];  Service: Cardiovascular;;   POLYPECTOMY  10/24/2017   Procedure: POLYPECTOMY;  Surgeon: [name], MD;  [facility];  Service: Endoscopy;;  colon    TOTAL HIP ARTHROPLASTY Left 01/30/2019   Procedure: TOTAL HIP ARTHROPLASTY ANTERIOR APPROACH;  Surgeon: Renette Butters, MD;  Location: [facility];  Service: Orthopedics;  Laterality: Left;   ULTRASOUND GUIDANCE FOR VASCULAR ACCESS Left 04/13/2021   Procedure: ULTRASOUND GUIDANCE FOR VASCULAR ACCESS, left femoral;  Surgeon: [name], MD;  Location: [facility];  Service: Vascular;  Laterality: Left;    Social History   Socioeconomic History   Marital status: Married    Spouse name: Not on file   Number of children: 2   Years of education: Not on file   Highest education level: Not on file  Occupational History   Occupation:  retired    Fish farm manager: RETIRED    Comment: truck driver  Tobacco Use   Smoking status: Former    Packs/day: 2.00    Years: 40.00    Pack years: 80.00    Types: Cigarettes    Start date: 98    Quit date: 07/20/1995    Years since quitting: 25.8   Smokeless tobacco: Never  Vaping Use   Vaping Use: Never used  Substance and Sexual Activity   Alcohol use: No    Alcohol/week: 0.0 standard drinks   Drug use: No   Sexual activity: Yes    Partners: Female    Birth control/protection: Condom    Comment: friend  Other Topics Concern   Not on file  Social History Narrative   Not on file   Social Determinants of Health   Financial Resource Strain: Not on file  Food Insecurity: Not on file  Transportation Needs: Not on file  Physical Activity: Not on file  Stress: Not on file  Social Connections: Not on file  Intimate Partner Violence: Not on file    Family History  Problem Relation Age  of Onset   GER disease Mother    Coronary artery disease Brother    Congenital heart disease Sister    Colon cancer Neg Hx     Current Outpatient Medications  Medication Sig Dispense Refill   acetaminophen (TYLENOL) 500 MG tablet Take 1,000 mg by mouth daily as needed for headache.     albuterol (PROVENTIL) (2.5 MG/3ML) 0.083% nebulizer solution Take 3 mLs (2.5 mg total) by nebulization every 6 (six) hours as needed for wheezing or shortness of breath. DX: J44.9 75 mL 6   ALPRAZolam (XANAX) 0.5 MG tablet Take 1 tablet (0.5 mg total) by mouth at bedtime. 30 tablet 0   amLODipine (NORVASC) 10 MG tablet TAKE ONE (1) TABLET BY MOUTH EVERY DAY 90 tablet 3   aspirin EC 81 MG tablet Take 1 tablet (81 mg total) by mouth daily.     baclofen (LIORESAL) 10 MG tablet Take 1 tablet (10 mg total) by mouth 3 (three) times daily as needed for muscle spasms. 20 each 0   clopidogrel (PLAVIX) 75 MG tablet Take 1 tablet (75 mg total) by mouth daily. TAKE 1 TABLET BY MOUTH EVERY DAY     dexlansoprazole (DEXILANT) 60 MG capsule Take 1 capsule (60 mg total) by mouth as needed (takes 2-3 times per week).     Glycopyrrolate-Formoterol (BEVESPI AEROSPHERE) 9-4.8 MCG/ACT AERO Inhale 2 puffs into the lungs 2 (two) times daily. (Patient taking differently: Inhale 2 puffs into the lungs 2 (two) times daily as needed (shortness of breath).) 5.9 g 6   guaiFENesin (MUCINEX) 600 MG 12 hr tablet Take 600 mg by mouth every 12 (twelve) hours as needed (cough).     latanoprost (XALATAN) 0.005 % ophthalmic solution Place 1 drop into both eyes at bedtime.      losartan (COZAAR) 100 MG tablet TAKE ONE TABLET BY MOUTH EVERY NIGHT AT BEDTIME 90 tablet 3   metoprolol succinate (TOPROL-XL) 50 MG 24 hr tablet TAKE ONE (1) TABLET BY MOUTH EVERY DAY 90 tablet 0   montelukast (SINGULAIR) 10 MG tablet Take 1 tablet (10 mg total) by mouth daily. 90 tablet 4   Multiple Vitamins-Minerals (CENTRUM SILVER PO) Take 1 tablet by mouth daily.      nitroGLYCERIN (NITROSTAT) 0.4 MG SL tablet Place 1 tablet (0.4 mg total) under the tongue every 5 (five) minutes as needed for  chest pain. 25 tablet 0   oxyCODONE-acetaminophen (PERCOCET/ROXICET) 5-325 MG tablet Take 1 tablet by mouth every 4 (four) hours as needed for moderate pain. 20 tablet 0   rosuvastatin (CRESTOR) 20 MG tablet Take 1 tablet (20 mg total) by mouth daily. 30 tablet 3   No current facility-administered medications for this visit.    Allergies  Allergen Reactions   Altace [Ramipril] Cough     REVIEW OF SYSTEMS:   [X]  denotes positive finding, [ ]  denotes negative finding Cardiac  Comments:  Chest pain or chest pressure:    Shortness of breath upon exertion:    Short of breath when lying flat:    Irregular heart rhythm:        Vascular    Pain in calf, thigh, or hip brought on by ambulation:    Pain in feet at night that wakes you up from your sleep:     Blood clot in your veins:    Leg swelling:         Pulmonary    Oxygen at home:    Productive cough:     Wheezing:         Neurologic    Sudden weakness in arms or legs:     Sudden numbness in arms or legs:     Sudden onset of difficulty speaking or slurred speech:    Temporary loss of vision in one eye:     Problems with dizziness:         Gastrointestinal    Blood in stool:     Vomited blood:         Genitourinary    Burning when urinating:     Blood in urine:        Psychiatric    Major depression:         Hematologic    Bleeding problems:    Problems with blood clotting too easily:        Skin    Rashes or ulcers:        Constitutional    Fever or chills:      PHYSICAL EXAMINATION:  There were no vitals filed for this visit.  General:  WDWN in NAD; vital signs documented above Gait: Not observed HENT: WNL, normocephalic Pulmonary: normal non-labored breathing , without Rales, rhonchi,  wheezing Cardiac: regular HR Abdomen: soft, NT, no masses Skin: with rashes Vascular  Exam/Pulses:  Right Left  Radial 2+ (normal) 2+ (normal)  Ulnar 2+ (normal) 2+ (normal)  Femoral  Incision well-healed  Popliteal  Incision with staples. No drainage, erythema limited to staple site, no tracking of bypass tract, no pain to palpation, no warmth, no fluctuance  DP  2+ (normal)  PT  monophasic   Extremities: without ischemic changes, without Gangrene , without cellulitis - appears to be irritation from staples; without open wounds;  Musculoskeletal: no muscle wasting or atrophy  Neurologic: A&O X 3;  No focal weakness or paresthesias are detected Psychiatric:  The pt has Normal affect.   Non-Invasive Vascular Imaging:   none    ASSESSMENT/PLAN: Gregory Eaton is a 76 y.o. male presenting with erythematous staple line at his below-knee popliteal artery redo incision.  The erythema appears to be related to staple irritation.  There is no warmth, fluctuance, erythema tracking up the graft track.  Gregory Eaton denies history of fever, chills.  The wound is dry, and has not had any drainage. I removed half of the staples at his clinic visit today.  Being that Gregory Eaton has 6 mm ringed PTFE in the wound bed, I chose to treat him for skin and soft tissue infection to be safe.  I placed him on 14 days of Augmentin. I will see Gregory Eaton in 1 week and will take out the remainder of his staples at that time. Gregory Eaton was asked to call my office if any new drainage occurs, worsening erythema, induration, fever, chills.    [name], MD [facility] [phone]

## 2021-05-25 NOTE — Telephone Encounter (Signed)
Patient calls today to report a foul odor from staples in his left lower leg. Says it is slightly red and not too painful, but smells very bad. Denies drainage. He has been showering. Placed patient on schedule for wound check.

## 2021-05-26 ENCOUNTER — Other Ambulatory Visit: Payer: Self-pay

## 2021-05-26 ENCOUNTER — Other Ambulatory Visit: Payer: Self-pay | Admitting: Cardiovascular Disease

## 2021-05-26 ENCOUNTER — Telehealth: Payer: Self-pay

## 2021-05-26 DIAGNOSIS — Z95828 Presence of other vascular implants and grafts: Secondary | ICD-10-CM

## 2021-05-26 MED ORDER — AMOXICILLIN-POT CLAVULANATE 875-125 MG PO TABS: 1.0000 | ORAL_TABLET | Freq: Two times a day (BID) | ORAL | Status: DC

## 2021-05-26 MED ORDER — AMOXICILLIN-POT CLAVULANATE 875-125 MG PO TABS: 1.0000 | ORAL_TABLET | Freq: Two times a day (BID) | ORAL | Status: AC

## 2021-05-26 NOTE — Telephone Encounter (Signed)
Opened in error

## 2021-05-26 NOTE — Addendum Note (Signed)
Addended by: Orlie Pollen E on: 05/26/2021 11:42 AM   Modules accepted: Orders

## 2021-05-26 NOTE — Addendum Note (Signed)
Addended by: Orlie Pollen E on: 05/26/2021 11:23 AM   Modules accepted: Orders

## 2021-06-02 NOTE — Progress Notes (Signed)
Office Note      HPI: Gregory Eaton is a 75 y.o. (1946-03-01) male presenting in follow up s/p 05/11/21 left femoral to below-knee popliteal artery bypass w/ ringed PTFE.  Gregory Eaton has had multiple vascular surgery interventions including 03/2021 right iliofemoral endarterectomy, bilateral common iliac artery stenting, bilateral external iliac artery stenting. Patient also had a previous greater saphenous vein bypass in 1993 with Dr. [name].  He presents today after 1 week antibiotic course for erythema at the distal incision site.  Felt this was due to the staples causing irritation, but I placed him on antibiotics as bypasses PTFE. Exam today, Gregory Eaton was doing well.  He had no complaints.  He denied fevers, chills.  He stated the erythema has nearly resolved.  He has no pain behind his knee.  The pt is  on a statin for cholesterol management.  The pt is  on a daily aspirin.   Other AC:  plavix The pt is  on medication for hypertension.   The pt is not diabetic.  Tobacco hx:  former  Past Medical History:  Diagnosis Date   Adenomatous colon polyp 03/2009   Last colonoscopy by Dr. [name]    Adenomatous polyp 2010   Adenomatous polyp of colon 11/03/2010   Arthritis    Barrett's esophagus    CAD (coronary artery disease)    Complication of anesthesia    has a shortened esophogus due to CA.    COPD (chronic obstructive pulmonary disease) (HCC)    Severe emphysema per CT   Diverticulosis    Emphysema of lung (HCC)    Esophageal carcinoma ([place]) 03/2009   T1N1M0   GERD (gastroesophageal reflux disease)    Glaucoma    History of Doppler ultrasound 11/09/2011   03/2014- 50-69% L ICA stenosis;carotid doppler; L bulb/prox ICA 0-49% diameter reduction; L vertebral artery - occlusive ds; L ECA  demonstrates severe amount of fibrous plaque   History of Doppler ultrasound 11/09/2011   LEAs; R ABI - mod art. insuff.; L ABI normal at rest; R SFA - occlusive ds; L SFA - occlusive ds; patent  fem-pop graft   History of echocardiogram 08/27/2009   EF >55%   History of hiatal hernia    History of kidney stones 1965   History of nuclear stress test 11/24/2011   lexiscan; normal perfusion; low risk scan; non-diagnostic for ischemia   Hyperlipidemia    Hypertension    Left carotid artery stenosis 04/08/2014   Pneumonia    Pre-diabetes    Pulmonary nodule, right 04/08/2014   2.8 mm-incidental finding on CT   PVD (peripheral vascular disease) ([place])    Tachyarrhythmia 1999   Status post ablation at [facility]   Tobacco abuse     Past Surgical History:  Procedure Laterality Date   ABDOMINAL AORTOGRAM W/LOWER EXTREMITY N/A 04/01/2021   Procedure: ABDOMINAL AORTOGRAM W/LOWER EXTREMITY;  Surgeon: [name], MD;  Location: [place] [facility];  Service: Cardiovascular;  Laterality: N/A;   BIOPSY  10/24/2017   Procedure: BIOPSY;  Surgeon: [name], MD;  [facility];  Service: Endoscopy;;  esophagus   CATARACT EXTRACTION Bilateral    COLONOSCOPY  03/17/2009   Dr.Rourk- normal rectum, sigmoid diverticula, some pale sigmoid mucosa with diffuse petechiae. pedunculated polyp at the splenic flexure, remainder of colonic mucosa appeared normal. bx= adenomatous polyp   COLONOSCOPY  04/19/2003   Dr.Rehman- few diverticu;a at the sigmoid colon, 3 small polyps, one at the transverse colon  and 2 at the sigmoid, small external hemorrhoids. bx report not available.    COLONOSCOPY WITH PROPOFOL N/A 10/24/2017   Dr. [name]: Diverticulosis, 11 mm polyp at the ileocecal valve, tubular adenoma.  Repeat colonoscopy in 3 years   CORONARY ARTERY BYPASS GRAFT  1998   [name]   ENDARTERECTOMY FEMORAL Right 04/13/2021   Procedure: RIGHT ILIOFEMORAL ARTERY ENDARTERECTOMY WITH PATCH ANGIOPLASTY USING HEMASHIELD [place];  Surgeon: [name], MD;  Location: [place];  Service: Vascular;  Laterality: Right;   ESOPHAGECTOMY  2010   [facility] Dr. [name]    ESOPHAGOGASTRODUODENOSCOPY  11/25/2010   Dr.Rourk- s/p esophagectomy with gastric pull-up, esophageal erosions straddling the surgical anastomosis, salmon colored epithelium coming up a good centimeter to a centimeter and a half above the suture line, islands of salmon colored epithelium in the most poximal residual esophagus, remainder of gastric mucosa appeared normal. bx= swamous &gastric glandular mucosa w/chronic active inflammation   ESOPHAGOGASTRODUODENOSCOPY  03/17/2009   Dr.Rourk- 4cm segment of salmon-colored epithlium distal esophagus suspicious for barretts esophagus. area of suspicious nodularity w/in this segment bx seperately. small to moderate size hiatal hernia, o/w normal stomach D1 and D2 bx=adenocarcinoma   ESOPHAGOGASTRODUODENOSCOPY (EGD) WITH PROPOFOL N/A 10/24/2017   Dr. [name]: Remnant of esophagus with anastomosis with stomach at 24 cm from the incisors, 2 cm above the anastomosis he was found to have Barrett's but no dysplasia.  Advised for repeat EGD in 3 years   EYE SURGERY     cataract   FEMORAL-POPLITEAL BYPASS GRAFT  1993   occlusive ds in R SFA   FEMORAL-POPLITEAL BYPASS GRAFT Left 05/11/2021   Procedure: LEFT FEMORAL-BELOW KNEE POPLITEAL ARTERY BYPASS GRAFT WITH 6 mm PTFE and COMPLETION ANGIOGRAM;  Surgeon: [name], MD;  Location: [place];  Service: Vascular;  Laterality: Left;   GASTRIC PULL THROUGH  2010   With esophagectomy   INSERTION OF ILIAC STENT Bilateral 04/13/2021   Procedure: INSERTION OF BILATERAL COMMON ILIAC KISSING STENTS AND INSERION OF RIGHT EXTERNAL ILIAC STENT;  Surgeon: [name], MD;  Location: [place];  Service: Vascular;  Laterality: Bilateral;   JOINT REPLACEMENT     LIPOMA EXCISION  2010   LOWER EXTREMITY ANGIOGRAM Left 04/13/2021   Procedure: LEFT LOWER EXTREMITY ANGIOGRAM ;  Surgeon: [name], MD;  Location: [place];  Service: Vascular;  Laterality: Left;   PERIPHERAL VASCULAR INTERVENTION  04/01/2021   Procedure:  PERIPHERAL VASCULAR INTERVENTION;  Surgeon: [name], MD;  Location: [facility];  Service: Cardiovascular;;   POLYPECTOMY  10/24/2017   Procedure: POLYPECTOMY;  Surgeon: [name], MD;  [facility];  Service: Endoscopy;;  colon    TOTAL HIP ARTHROPLASTY Left 01/30/2019   Procedure: TOTAL HIP ARTHROPLASTY ANTERIOR APPROACH;  Surgeon: Renette Butters, MD;  Location: [facility];  Service: Orthopedics;  Laterality: Left;   ULTRASOUND GUIDANCE FOR VASCULAR ACCESS Left 04/13/2021   Procedure: ULTRASOUND GUIDANCE FOR VASCULAR ACCESS, left femoral;  Surgeon: [name], MD;  Location: [facility];  Service: Vascular;  Laterality: Left;    Social History   Socioeconomic History   Marital status: Married    Spouse name: Not on file   Number of children: 2   Years of education: Not on file   Highest education level: Not on file  Occupational History   Occupation: retired    Fish farm manager: RETIRED    Comment: truck driver  Tobacco Use   Smoking status: Former  Packs/day: 2.00    Years: 40.00    Pack years: 80.00    Types: Cigarettes    Start date: 20    Quit date: 07/20/1995    Years since quitting: 25.8   Smokeless tobacco: Never  Vaping Use   Vaping Use: Never used  Substance and Sexual Activity   Alcohol use: No    Alcohol/week: 0.0 standard drinks   Drug use: No   Sexual activity: Yes    Partners: Female    Birth control/protection: Condom    Comment: friend  Other Topics Concern   Not on file  Social History Narrative   Not on file   Social Determinants of Health   Financial Resource Strain: Not on file  Food Insecurity: Not on file  Transportation Needs: Not on file  Physical Activity: Not on file  Stress: Not on file  Social Connections: Not on file  Intimate Partner Violence: Not on file    Family History  Problem Relation Age of Onset   GER disease Mother    Coronary artery disease Brother    Congenital heart disease Sister     Colon cancer Neg Hx     Current Outpatient Medications  Medication Sig Dispense Refill   acetaminophen (TYLENOL) 500 MG tablet Take 1,000 mg by mouth daily as needed for headache.     albuterol (PROVENTIL) (2.5 MG/3ML) 0.083% nebulizer solution Take 3 mLs (2.5 mg total) by nebulization every 6 (six) hours as needed for wheezing or shortness of breath. DX: J44.9 75 mL 6   ALPRAZolam (XANAX) 0.5 MG tablet Take 1 tablet (0.5 mg total) by mouth at bedtime. 30 tablet 0   amLODipine (NORVASC) 10 MG tablet TAKE ONE (1) TABLET BY MOUTH EVERY DAY 90 tablet 3   aspirin EC 81 MG tablet Take 1 tablet (81 mg total) by mouth daily.     baclofen (LIORESAL) 10 MG tablet Take 1 tablet (10 mg total) by mouth 3 (three) times daily as needed for muscle spasms. 20 each 0   clopidogrel (PLAVIX) 75 MG tablet TAKE ONE (1) TABLET BY MOUTH EVERY DAY 90 tablet 0   dexlansoprazole (DEXILANT) 60 MG capsule Take 1 capsule (60 mg total) by mouth as needed (takes 2-3 times per week).     Glycopyrrolate-Formoterol (BEVESPI AEROSPHERE) 9-4.8 MCG/ACT AERO Inhale 2 puffs into the lungs 2 (two) times daily. (Patient taking differently: Inhale 2 puffs into the lungs 2 (two) times daily as needed (shortness of breath).) 5.9 g 6   guaiFENesin (MUCINEX) 600 MG 12 hr tablet Take 600 mg by mouth every 12 (twelve) hours as needed (cough).     latanoprost (XALATAN) 0.005 % ophthalmic solution Place 1 drop into both eyes at bedtime.      losartan (COZAAR) 100 MG tablet TAKE ONE TABLET BY MOUTH EVERY NIGHT AT BEDTIME 90 tablet 3   metoprolol succinate (TOPROL-XL) 50 MG 24 hr tablet TAKE ONE (1) TABLET BY MOUTH EVERY DAY 90 tablet 0   montelukast (SINGULAIR) 10 MG tablet Take 1 tablet (10 mg total) by mouth daily. 90 tablet 4   Multiple Vitamins-Minerals (CENTRUM SILVER PO) Take 1 tablet by mouth daily.     nitroGLYCERIN (NITROSTAT) 0.4 MG SL tablet Place 1 tablet (0.4 mg total) under the tongue every 5 (five) minutes as needed for chest pain.  25 tablet 0   oxyCODONE-acetaminophen (PERCOCET/ROXICET) 5-325 MG tablet Take 1 tablet by mouth every 4 (four) hours as needed for moderate pain. 20 tablet 0  rosuvastatin (CRESTOR) 20 MG tablet Take 1 tablet (20 mg total) by mouth daily. 30 tablet 3   Current Facility-Administered Medications  Medication Dose Route Frequency Provider Last Rate Last Admin   amoxicillin-clavulanate (AUGMENTIN) 875-125 MG per tablet 1 tablet  1 tablet Oral Q12H [name], MD        Allergies  Allergen Reactions   Altace [Ramipril] Cough     REVIEW OF SYSTEMS:   [X]  denotes positive finding, [ ]  denotes negative finding Cardiac  Comments:  Chest pain or chest pressure:    Shortness of breath upon exertion:    Short of breath when lying flat:    Irregular heart rhythm:        Vascular    Pain in calf, thigh, or hip brought on by ambulation:    Pain in feet at night that wakes you up from your sleep:     Blood clot in your veins:    Leg swelling:         Pulmonary    Oxygen at home:    Productive cough:     Wheezing:         Neurologic    Sudden weakness in arms or legs:     Sudden numbness in arms or legs:     Sudden onset of difficulty speaking or slurred speech:    Temporary loss of vision in one eye:     Problems with dizziness:         Gastrointestinal    Blood in stool:     Vomited blood:         Genitourinary    Burning when urinating:     Blood in urine:        Psychiatric    Major depression:         Hematologic    Bleeding problems:    Problems with blood clotting too easily:        Skin    Rashes or ulcers:        Constitutional    Fever or chills:      PHYSICAL EXAMINATION:  There were no vitals filed for this visit.  General:  WDWN in NAD; vital signs documented above Gait: Not observed HENT: WNL, normocephalic Pulmonary: normal non-labored breathing , without Rales, rhonchi,  wheezing Cardiac: regular HR Abdomen: soft, NT, no masses Skin: with  rashes Vascular Exam/Pulses:  Right Left  Radial 2+ (normal) 2+ (normal)  Ulnar 2+ (normal) 2+ (normal)  Femoral  Incision well-healed  Popliteal  Incision with staples. No drainage, erythema limited to staple site, no tracking of bypass tract, no pain to palpation, no warmth, no fluctuance  DP  2+ (normal)  PT  monophasic   Extremities: without ischemic changes, without Gangrene , without cellulitis - appears to be irritation from staples; without open wounds;  Musculoskeletal: no muscle wasting or atrophy  Neurologic: A&O X 3;  No focal weakness or paresthesias are detected Psychiatric:  The pt has Normal affect.   Non-Invasive Vascular Imaging:   none    ASSESSMENT/PLAN: PERRIN GENS is a 75 y.o. male presenting in follow-up for erythematous staple line at his below-knee popliteal artery redo incision.  Since starting the antibiotics 1 week ago, the erythema has resolved. The remainder of the below-knee popliteal artery incision staples were removed.  Incision is well opposed, and healing appropriately.  Gregory Eaton will return for his normal, scheduled follow-up for interval bypass imaging and carotid duplex.Vonna Kotyk  Sharion Settler, MD [facility] [phone]

## 2021-06-05 ENCOUNTER — Ambulatory Visit (INDEPENDENT_AMBULATORY_CARE_PROVIDER_SITE_OTHER): Payer: Medicare Other | Admitting: Vascular Surgery

## 2021-06-05 ENCOUNTER — Other Ambulatory Visit: Payer: Self-pay

## 2021-06-05 ENCOUNTER — Encounter: Payer: Self-pay | Admitting: Vascular Surgery

## 2021-06-05 VITALS — BP 154/69 | HR 76 | Temp 98.1°F | Resp 20 | Ht 68.0 in | Wt 155.0 lb

## 2021-06-05 DIAGNOSIS — Z95828 Presence of other vascular implants and grafts: Secondary | ICD-10-CM

## 2021-06-08 ENCOUNTER — Other Ambulatory Visit: Payer: Self-pay

## 2021-06-08 DIAGNOSIS — I739 Peripheral vascular disease, unspecified: Secondary | ICD-10-CM

## 2021-06-15 ENCOUNTER — Ambulatory Visit (INDEPENDENT_AMBULATORY_CARE_PROVIDER_SITE_OTHER)
Admission: RE | Admit: 2021-06-15 | Discharge: 2021-06-15 | Disposition: A | Discharge status: Home / Self Care | Payer: Medicare Other | Source: Ambulatory Visit | Attending: Vascular Surgery | Admitting: Vascular Surgery

## 2021-06-15 ENCOUNTER — Other Ambulatory Visit: Payer: Self-pay

## 2021-06-15 ENCOUNTER — Ambulatory Visit (HOSPITAL_COMMUNITY)
Admission: RE | Admit: 2021-06-15 | Discharge: 2021-06-15 | Disposition: A | Discharge status: Home / Self Care | Payer: Medicare Other | Source: Ambulatory Visit | Attending: Vascular Surgery | Admitting: Vascular Surgery

## 2021-06-15 DIAGNOSIS — I739 Peripheral vascular disease, unspecified: Secondary | ICD-10-CM

## 2021-06-15 DIAGNOSIS — Z95828 Presence of other vascular implants and grafts: Secondary | ICD-10-CM | POA: Insufficient documentation

## 2021-06-16 ENCOUNTER — Telehealth (INDEPENDENT_AMBULATORY_CARE_PROVIDER_SITE_OTHER): Payer: Medicare Other | Admitting: Vascular Surgery

## 2021-06-16 DIAGNOSIS — Z95828 Presence of other vascular implants and grafts: Secondary | ICD-10-CM

## 2021-06-16 NOTE — Telephone Encounter (Signed)
Telephone Note      HPI: Gregory Eaton is a 75 y.o. (01-Apr-1946) male presenting in follow up s/p 05/11/21 left femoral to below-knee popliteal artery bypass w/ ringed PTFE.  Gregory Eaton has had multiple vascular surgery interventions including 03/2021 right iliofemoral endarterectomy, bilateral common iliac artery stenting, bilateral external iliac artery stenting. Patient also had a previous greater saphenous vein bypass in 1993 with Dr. [name].  I called the patient this morning to update him on his recent noninvasive vascular ultrasounds. Gregory Eaton is doing well with no complaints.  The erythema at the distal aspect of his left below-knee popliteal artery bypass incision had resolved with staple removal.  He denied fevers chills.  He feels that his left leg is getting better daily, and he has returned to a more active lifestyle.   The pt is  on a statin for cholesterol management.  The pt is  on a daily aspirin.   Other AC:  plavix The pt is  on medication for hypertension.   The pt is not diabetic.  Tobacco hx:  former  Past Medical History:  Diagnosis Date   Adenomatous colon polyp 03/2009   Last colonoscopy by Dr. [name]    Adenomatous polyp 2010   Adenomatous polyp of colon 11/03/2010   Arthritis    Barrett's esophagus    CAD (coronary artery disease)    Complication of anesthesia    has a shortened esophogus due to CA.    COPD (chronic obstructive pulmonary disease) (HCC)    Severe emphysema per CT   Diverticulosis    Emphysema of lung (HCC)    Esophageal carcinoma ([place]) 03/2009   T1N1M0   GERD (gastroesophageal reflux disease)    Glaucoma    History of Doppler ultrasound 11/09/2011   03/2014- 50-69% L ICA stenosis;carotid doppler; L bulb/prox ICA 0-49% diameter reduction; L vertebral artery - occlusive ds; L ECA  demonstrates severe amount of fibrous plaque   History of Doppler ultrasound 11/09/2011   LEAs; R ABI - mod art. insuff.; L ABI normal at rest; R SFA -  occlusive ds; L SFA - occlusive ds; patent fem-pop graft   History of echocardiogram 08/27/2009   EF >55%   History of hiatal hernia    History of kidney stones 1965   History of nuclear stress test 11/24/2011   lexiscan; normal perfusion; low risk scan; non-diagnostic for ischemia   Hyperlipidemia    Hypertension    Left carotid artery stenosis 04/08/2014   Pneumonia    Pre-diabetes    Pulmonary nodule, right 04/08/2014   2.8 mm-incidental finding on CT   PVD (peripheral vascular disease) ([place])    Tachyarrhythmia 1999   Status post ablation at [facility]   Tobacco abuse     Past Surgical History:  Procedure Laterality Date   ABDOMINAL AORTOGRAM W/LOWER EXTREMITY N/A 04/01/2021   Procedure: ABDOMINAL AORTOGRAM W/LOWER EXTREMITY;  Surgeon: [name], MD;  Location: [place] [facility];  Service: Cardiovascular;  Laterality: N/A;   BIOPSY  10/24/2017   Procedure: BIOPSY;  Surgeon: [name], MD;  [facility];  Service: Endoscopy;;  esophagus   CATARACT EXTRACTION Bilateral    COLONOSCOPY  03/17/2009   Dr.Rourk- normal rectum, sigmoid diverticula, some pale sigmoid mucosa with diffuse petechiae. pedunculated polyp at the splenic flexure, remainder of colonic mucosa appeared normal. bx= adenomatous polyp   COLONOSCOPY  04/19/2003   Dr.Rehman- few diverticu;a at the sigmoid colon, 3 small polyps, one at the  transverse colon and 2 at the sigmoid, small external hemorrhoids. bx report not available.    COLONOSCOPY WITH PROPOFOL N/A 10/24/2017   Dr. [name]: Diverticulosis, 11 mm polyp at the ileocecal valve, tubular adenoma.  Repeat colonoscopy in 3 years   CORONARY ARTERY BYPASS GRAFT  1998   [name]   ENDARTERECTOMY FEMORAL Right 04/13/2021   Procedure: RIGHT ILIOFEMORAL ARTERY ENDARTERECTOMY WITH PATCH ANGIOPLASTY USING HEMASHIELD [place];  Surgeon: [name], MD;  Location: [place];  Service: Vascular;  Laterality: Right;   ESOPHAGECTOMY   2010   [facility] Dr. [name]   ESOPHAGOGASTRODUODENOSCOPY  11/25/2010   Dr.Rourk- s/p esophagectomy with gastric pull-up, esophageal erosions straddling the surgical anastomosis, salmon colored epithelium coming up a good centimeter to a centimeter and a half above the suture line, islands of salmon colored epithelium in the most poximal residual esophagus, remainder of gastric mucosa appeared normal. bx= swamous &gastric glandular mucosa w/chronic active inflammation   ESOPHAGOGASTRODUODENOSCOPY  03/17/2009   Dr.Rourk- 4cm segment of salmon-colored epithlium distal esophagus suspicious for barretts esophagus. area of suspicious nodularity w/in this segment bx seperately. small to moderate size hiatal hernia, o/w normal stomach D1 and D2 bx=adenocarcinoma   ESOPHAGOGASTRODUODENOSCOPY (EGD) WITH PROPOFOL N/A 10/24/2017   Dr. [name]: Remnant of esophagus with anastomosis with stomach at 24 cm from the incisors, 2 cm above the anastomosis he was found to have Barrett's but no dysplasia.  Advised for repeat EGD in 3 years   EYE SURGERY     cataract   FEMORAL-POPLITEAL BYPASS GRAFT  1993   occlusive ds in R SFA   FEMORAL-POPLITEAL BYPASS GRAFT Left 05/11/2021   Procedure: LEFT FEMORAL-BELOW KNEE POPLITEAL ARTERY BYPASS GRAFT WITH 6 mm PTFE and COMPLETION ANGIOGRAM;  Surgeon: [name], MD;  Location: [place];  Service: Vascular;  Laterality: Left;   GASTRIC PULL THROUGH  2010   With esophagectomy   INSERTION OF ILIAC STENT Bilateral 04/13/2021   Procedure: INSERTION OF BILATERAL COMMON ILIAC KISSING STENTS AND INSERION OF RIGHT EXTERNAL ILIAC STENT;  Surgeon: [name], MD;  Location: [place];  Service: Vascular;  Laterality: Bilateral;   JOINT REPLACEMENT     LIPOMA EXCISION  2010   LOWER EXTREMITY ANGIOGRAM Left 04/13/2021   Procedure: LEFT LOWER EXTREMITY ANGIOGRAM ;  Surgeon: [name], MD;  Location: [place];  Service: Vascular;  Laterality: Left;   PERIPHERAL VASCULAR  INTERVENTION  04/01/2021   Procedure: PERIPHERAL VASCULAR INTERVENTION;  Surgeon: [name], MD;  Location: [place] [facility];  Service: Cardiovascular;;   POLYPECTOMY  10/24/2017   Procedure: POLYPECTOMY;  Surgeon: [name], MD;  [facility];  Service: Endoscopy;;  colon    TOTAL HIP ARTHROPLASTY Left 01/30/2019   Procedure: TOTAL HIP ARTHROPLASTY ANTERIOR APPROACH;  Surgeon: Renette Butters, MD;  Location: [facility];  Service: Orthopedics;  Laterality: Left;   ULTRASOUND GUIDANCE FOR VASCULAR ACCESS Left 04/13/2021   Procedure: ULTRASOUND GUIDANCE FOR VASCULAR ACCESS, left femoral;  Surgeon: [name], MD;  Location: [facility];  Service: Vascular;  Laterality: Left;    Social History   Socioeconomic History   Marital status: Married    Spouse name: Not on file   Number of children: 2   Years of education: Not on file   Highest education level: Not on file  Occupational History   Occupation: retired    Fish farm manager: RETIRED    Comment: truck driver  Tobacco Use   Smoking  status: Former    Packs/day: 2.00    Years: 40.00    Pack years: 80.00    Types: Cigarettes    Start date: 68    Quit date: 07/20/1995    Years since quitting: 25.9   Smokeless tobacco: Never  Vaping Use   Vaping Use: Never used  Substance and Sexual Activity   Alcohol use: No    Alcohol/week: 0.0 standard drinks   Drug use: No   Sexual activity: Yes    Partners: Female    Birth control/protection: Condom    Comment: friend  Other Topics Concern   Not on file  Social History Narrative   Not on file   Social Determinants of Health   Financial Resource Strain: Not on file  Food Insecurity: Not on file  Transportation Needs: Not on file  Physical Activity: Not on file  Stress: Not on file  Social Connections: Not on file  Intimate Partner Violence: Not on file    Family History  Problem Relation Age of Onset   GER disease Mother    Coronary artery disease Brother     Congenital heart disease Sister    Colon cancer Neg Hx     Current Outpatient Medications  Medication Sig Dispense Refill   acetaminophen (TYLENOL) 500 MG tablet Take 1,000 mg by mouth daily as needed for headache.     albuterol (PROVENTIL) (2.5 MG/3ML) 0.083% nebulizer solution Take 3 mLs (2.5 mg total) by nebulization every 6 (six) hours as needed for wheezing or shortness of breath. DX: J44.9 75 mL 6   ALPRAZolam (XANAX) 0.5 MG tablet Take 1 tablet (0.5 mg total) by mouth at bedtime. 30 tablet 0   amLODipine (NORVASC) 10 MG tablet TAKE ONE (1) TABLET BY MOUTH EVERY DAY 90 tablet 3   aspirin EC 81 MG tablet Take 1 tablet (81 mg total) by mouth daily.     baclofen (LIORESAL) 10 MG tablet Take 1 tablet (10 mg total) by mouth 3 (three) times daily as needed for muscle spasms. 20 each 0   clopidogrel (PLAVIX) 75 MG tablet TAKE ONE (1) TABLET BY MOUTH EVERY DAY 90 tablet 0   dexlansoprazole (DEXILANT) 60 MG capsule Take 1 capsule (60 mg total) by mouth as needed (takes 2-3 times per week).     Glycopyrrolate-Formoterol (BEVESPI AEROSPHERE) 9-4.8 MCG/ACT AERO Inhale 2 puffs into the lungs 2 (two) times daily. (Patient taking differently: Inhale 2 puffs into the lungs 2 (two) times daily as needed (shortness of breath).) 5.9 g 6   guaiFENesin (MUCINEX) 600 MG 12 hr tablet Take 600 mg by mouth every 12 (twelve) hours as needed (cough).     latanoprost (XALATAN) 0.005 % ophthalmic solution Place 1 drop into both eyes at bedtime.      losartan (COZAAR) 100 MG tablet TAKE ONE TABLET BY MOUTH EVERY NIGHT AT BEDTIME 90 tablet 3   metoprolol succinate (TOPROL-XL) 50 MG 24 hr tablet TAKE ONE (1) TABLET BY MOUTH EVERY DAY 90 tablet 0   montelukast (SINGULAIR) 10 MG tablet Take 1 tablet (10 mg total) by mouth daily. 90 tablet 4   Multiple Vitamins-Minerals (CENTRUM SILVER PO) Take 1 tablet by mouth daily.     nitroGLYCERIN (NITROSTAT) 0.4 MG SL tablet Place 1 tablet (0.4 mg total) under the tongue every 5  (five) minutes as needed for chest pain. 25 tablet 0   oxyCODONE-acetaminophen (PERCOCET/ROXICET) 5-325 MG tablet Take 1 tablet by mouth every 4 (four) hours as needed for moderate  pain. 20 tablet 0   rosuvastatin (CRESTOR) 20 MG tablet Take 1 tablet (20 mg total) by mouth daily. 30 tablet 3   No current facility-administered medications for this visit.    Allergies  Allergen Reactions   Altace [Ramipril] Cough     REVIEW OF SYSTEMS:   [X]  denotes positive finding, [ ]  denotes negative finding Cardiac  Comments:  Chest pain or chest pressure:    Shortness of breath upon exertion:    Short of breath when lying flat:    Irregular heart rhythm:        Vascular    Pain in calf, thigh, or hip brought on by ambulation:    Pain in feet at night that wakes you up from your sleep:     Blood clot in your veins:    Leg swelling:         Pulmonary    Oxygen at home:    Productive cough:     Wheezing:         Neurologic    Sudden weakness in arms or legs:     Sudden numbness in arms or legs:     Sudden onset of difficulty speaking or slurred speech:    Temporary loss of vision in one eye:     Problems with dizziness:         Gastrointestinal    Blood in stool:     Vomited blood:         Genitourinary    Burning when urinating:     Blood in urine:        Psychiatric    Major depression:         Hematologic    Bleeding problems:    Problems with blood clotting too easily:        Skin    Rashes or ulcers:        Constitutional    Fever or chills:      PHYSICAL EXAMINATION:  There were no vitals filed for this visit.  General:  WDWN in NAD; vital signs documented above Gait: Not observed HENT: WNL, normocephalic Pulmonary: normal non-labored breathing , without Rales, rhonchi,  wheezing Cardiac: regular HR Abdomen: soft, NT, no masses Skin: with rashes Vascular Exam/Pulses:  Right Left  Radial 2+ (normal) 2+ (normal)  Ulnar 2+ (normal) 2+ (normal)  Femoral   Incision well-healed  Popliteal  Incision with staples. No drainage, erythema limited to staple site, no tracking of bypass tract, no pain to palpation, no warmth, no fluctuance  DP  2+ (normal)  PT  monophasic   Extremities: without ischemic changes, without Gangrene , without cellulitis - appears to be irritation from staples; without open wounds;  Musculoskeletal: no muscle wasting or atrophy  Neurologic: A&O X 3;  No focal weakness or paresthesias are detected Psychiatric:  The pt has Normal affect.   Non-Invasive Vascular Imaging:    Significant improvement in left lower extremity ABI when compared to prior to bypass. Bypass widely patent with no concern for stenosis There is perigraft fluid appreciated, this is believed to be seroma.  The patient has no systemic symptoms or signs of infection. Bilateral carotid duplex ultrasonography demonstrates mild stenosis in the right internal carotid artery, moderate stenosis in the left internal carotid artery.   ASSESSMENT/PLAN: Gregory Eaton is a 75 y.o. male status post 05/11/2021 L Femoral to below-knee popliteal artery redo bypass.  Patient's wounds have healed.  Erythema present at the staple line resolved with  staple removal.  He denies fevers chills.  No systemic signs of infection.  Patient's ABIs have improved.  There is no stenosis appreciated in the bypass graft.  The perigraft fluid is likely seroma as the patient has no signs or symptoms of infection at this time.  I will continue to follow Gregory Eaton and will see him again in 3 months with repeat ABI and duplex ultrasound of his left lower extremity bypass graft.  He was asked to call our office if any concerns develop including new onset rest pain, tissue loss, erythema, induration.  Gregory Eaton was asked to seek immediate medical attention should he have symptoms of TIA, stroke, amaurosis.  I asked that he continue his current medication regimen.    [name], MD [facility] [phone]

## 2021-08-04 ENCOUNTER — Other Ambulatory Visit: Payer: Self-pay | Admitting: Cardiovascular Disease

## 2021-08-14 ENCOUNTER — Other Ambulatory Visit (HOSPITAL_COMMUNITY): Payer: Self-pay | Admitting: Adult Health Nurse Practitioner

## 2021-08-14 ENCOUNTER — Ambulatory Visit (HOSPITAL_COMMUNITY)
Admission: RE | Admit: 2021-08-14 | Discharge: 2021-08-14 | Disposition: A | Discharge status: Home / Self Care | Payer: Medicare Other | Source: Ambulatory Visit | Attending: Adult Health Nurse Practitioner | Admitting: Adult Health Nurse Practitioner

## 2021-08-14 DIAGNOSIS — R0602 Shortness of breath: Secondary | ICD-10-CM | POA: Insufficient documentation

## 2021-08-14 DIAGNOSIS — R062 Wheezing: Secondary | ICD-10-CM

## 2021-08-14 DIAGNOSIS — R0789 Other chest pain: Secondary | ICD-10-CM | POA: Diagnosis present

## 2021-08-15 ENCOUNTER — Emergency Department (HOSPITAL_COMMUNITY)
Admission: EM | Admit: 2021-08-15 | Discharge: 2021-08-15 | Disposition: A | Discharge status: Home / Self Care | Payer: Medicare Other | Attending: Emergency Medicine | Admitting: Emergency Medicine

## 2021-08-15 ENCOUNTER — Other Ambulatory Visit: Payer: Self-pay

## 2021-08-15 ENCOUNTER — Encounter (HOSPITAL_COMMUNITY): Payer: Self-pay | Admitting: *Deleted

## 2021-08-15 ENCOUNTER — Emergency Department (HOSPITAL_COMMUNITY): Payer: Medicare Other

## 2021-08-15 DIAGNOSIS — J449 Chronic obstructive pulmonary disease, unspecified: Secondary | ICD-10-CM | POA: Diagnosis not present

## 2021-08-15 DIAGNOSIS — Z79899 Other long term (current) drug therapy: Secondary | ICD-10-CM | POA: Diagnosis not present

## 2021-08-15 DIAGNOSIS — Z8501 Personal history of malignant neoplasm of esophagus: Secondary | ICD-10-CM | POA: Diagnosis not present

## 2021-08-15 DIAGNOSIS — I2510 Atherosclerotic heart disease of native coronary artery without angina pectoris: Secondary | ICD-10-CM | POA: Insufficient documentation

## 2021-08-15 DIAGNOSIS — I10 Essential (primary) hypertension: Secondary | ICD-10-CM | POA: Diagnosis not present

## 2021-08-15 DIAGNOSIS — R0789 Other chest pain: Secondary | ICD-10-CM | POA: Diagnosis present

## 2021-08-15 DIAGNOSIS — K219 Gastro-esophageal reflux disease without esophagitis: Secondary | ICD-10-CM | POA: Insufficient documentation

## 2021-08-15 DIAGNOSIS — R7989 Other specified abnormal findings of blood chemistry: Secondary | ICD-10-CM | POA: Diagnosis not present

## 2021-08-15 DIAGNOSIS — R079 Chest pain, unspecified: Secondary | ICD-10-CM

## 2021-08-15 DIAGNOSIS — Z7902 Long term (current) use of antithrombotics/antiplatelets: Secondary | ICD-10-CM | POA: Diagnosis not present

## 2021-08-15 DIAGNOSIS — Z7982 Long term (current) use of aspirin: Secondary | ICD-10-CM | POA: Diagnosis not present

## 2021-08-15 LAB — TROPONIN I (HIGH SENSITIVITY)
Troponin I (High Sensitivity): 5 ng/L (ref <18)
Troponin I (High Sensitivity): 6 ng/L (ref <18)

## 2021-08-15 LAB — BASIC METABOLIC PANEL
Anion gap: 5 (ref 5–15)
BUN: 16 mg/dL (ref 8–23)
CO2: 23 mmol/L (ref 22–32)
Calcium: 8.7 mg/dL — ABNORMAL LOW (ref 8.9–10.3)
Chloride: 109 mmol/L (ref 98–111)
Creatinine, Ser: 0.69 mg/dL (ref 0.61–1.24)
GFR, Estimated: >60 mL/min (ref >60)
Glucose, Bld: 112 mg/dL — ABNORMAL HIGH (ref 70–99)
Potassium: 3.8 mmol/L (ref 3.5–5.1)
Sodium: 137 mmol/L (ref 135–145)

## 2021-08-15 LAB — CBC
HCT: 36.8 % — ABNORMAL LOW (ref 39.0–52.0)
Hemoglobin: 11.3 g/dL — ABNORMAL LOW (ref 13.0–17.0)
MCH: 25 pg — ABNORMAL LOW (ref 26.0–34.0)
MCHC: 30.7 g/dL (ref 30.0–36.0)
MCV: 81.4 fL (ref 80.0–100.0)
Platelets: 336 10*3/uL (ref 150–400)
RBC: 4.52 MIL/uL (ref 4.22–5.81)
RDW: 16.6 % — ABNORMAL HIGH (ref 11.5–15.5)
WBC: 5.7 10*3/uL (ref 4.0–10.5)
nRBC: 0 % (ref 0.0–0.2)

## 2021-08-15 MED ORDER — IOHEXOL 350 MG/ML SOLN
75.0000 mL | Freq: Once | INTRAVENOUS | Status: AC | PRN
  Administered 2021-08-15: 75 mL via INTRAVENOUS

## 2021-08-15 MED ORDER — SUCRALFATE 1 GM/10ML PO SUSP: 1.0000 g | Freq: Three times a day (TID) | ORAL | 0 refills | Status: DC

## 2021-08-15 NOTE — ED Provider Notes (Signed)
[facility] Provider Note   CSN: [id number] Arrival date & time: 08/15/21  5993     History  Chief Complaint  Patient presents with   Chest Pain    Gregory Eaton is a 76 y.o. male.   Chest Pain  Patient has a history of esophageal carcinoma, hypertension, gastroesophageal reflux disease, coronary artery disease, peripheral vascular disease, hyperlipidemia and severe COPD.  Patient states he has had chest discomfort and some shortness of breath for the past week.  He feels like it is a pressure like pain and discomfort around his chest.  The symptoms have been getting better over this past week.  Patient saw his primary care doctor yesterday.  Those records were reviewed.  He had outpatient laboratory tests and was called this morning and told he needed to come to the ED to make sure he did not have a blood clot in his lungs.  Patient has not had any fevers.  He denies any coughing.  Feels like he is breathing easily now.  He does have some chronic swelling in his left lower leg after leg surgery but that is no different than usual and he has not noticed any increased swelling.  Home Medications Prior to Admission medications   Medication Sig Start Date End Date Taking? Authorizing Provider  sucralfate (CARAFATE) 1 GM/10ML suspension Take 10 mLs (1 g total) by mouth 4 (four) times daily -  with meals and at bedtime. 08/15/21  Yes Dorie Rank, MD  acetaminophen (TYLENOL) 500 MG tablet Take 1,000 mg by mouth daily as needed for headache.    [provider]  albuterol (PROVENTIL) (2.5 MG/3ML) 0.083% nebulizer solution Take 3 mLs (2.5 mg total) by nebulization every 6 (six) hours as needed for wheezing or shortness of breath. DX: J44.9 11/05/20   Margaretha Seeds, MD  ALPRAZolam Duanne Moron) 0.5 MG tablet Take 1 tablet (0.5 mg total) by mouth at bedtime. 10/07/14   Troy Sine, MD  amLODipine (NORVASC) 10 MG tablet TAKE ONE (1) TABLET BY MOUTH EVERY DAY 05/01/21    Troy Sine, MD  aspirin EC 81 MG tablet Take 1 tablet (81 mg total) by mouth daily. 01/02/21   Jennye Boroughs, MD  baclofen (LIORESAL) 10 MG tablet Take 1 tablet (10 mg total) by mouth 3 (three) times daily as needed for muscle spasms. 01/30/19   Prudencio Burly III, PA-C  clopidogrel (PLAVIX) 75 MG tablet TAKE ONE (1) TABLET BY MOUTH EVERY DAY 05/26/21   Troy Sine, MD  dexlansoprazole (DEXILANT) 60 MG capsule Take 1 capsule (60 mg total) by mouth as needed (takes 2-3 times per week). 01/02/21   Jennye Boroughs, MD  Glycopyrrolate-Formoterol (BEVESPI AEROSPHERE) 9-4.8 MCG/ACT AERO Inhale 2 puffs into the lungs 2 (two) times daily. Patient taking differently: Inhale 2 puffs into the lungs 2 (two) times daily as needed (shortness of breath). 11/05/20   Margaretha Seeds, MD  guaiFENesin (MUCINEX) 600 MG 12 hr tablet Take 600 mg by mouth every 12 (twelve) hours as needed (cough).    [provider]  latanoprost (XALATAN) 0.005 % ophthalmic solution Place 1 drop into both eyes at bedtime.  01/10/13   [provider]  losartan (COZAAR) 100 MG tablet TAKE ONE TABLET BY MOUTH EVERY NIGHT AT BEDTIME 02/11/21   Troy Sine, MD  metoprolol succinate (TOPROL-XL) 50 MG 24 hr tablet TAKE ONE (1) TABLET BY MOUTH EVERY DAY 08/04/21   Troy Sine, MD  montelukast (  SINGULAIR) 10 MG tablet Take 1 tablet (10 mg total) by mouth daily. 11/05/20   Margaretha Seeds, MD  Multiple Vitamins-Minerals (CENTRUM SILVER PO) Take 1 tablet by mouth daily.    [provider]  nitroGLYCERIN (NITROSTAT) 0.4 MG SL tablet Place 1 tablet (0.4 mg total) under the tongue every 5 (five) minutes as needed for chest pain. 02/10/21   Troy Sine, MD  oxyCODONE-acetaminophen (PERCOCET/ROXICET) 5-325 MG tablet Take 1 tablet by mouth every 4 (four) hours as needed for moderate pain. 05/13/21   Setzer, Edman Circle, PA-C  rosuvastatin (CRESTOR) 20 MG tablet Take 1 tablet (20 mg total) by mouth daily.  05/14/21   Setzer, Edman Circle, PA-C      Allergies    Altace [ramipril]    Review of Systems   Review of Systems  Cardiovascular:  Positive for chest pain.   Physical Exam Updated Vital Signs BP 140/61    Pulse 73    Temp 97.6 F (36.4 C) (Oral)    Resp (!) 8    Ht 1.727 m (5\' 8" )    Wt 70.3 kg    SpO2 97%    BMI 23.57 kg/m  Physical Exam Vitals and nursing note reviewed.  Constitutional:      General: He is not in acute distress.    Appearance: He is well-developed.  HENT:     Head: Normocephalic and atraumatic.     Right Ear: External ear normal.     Left Ear: External ear normal.  Eyes:     General: No scleral icterus.       Right eye: No discharge.        Left eye: No discharge.     Conjunctiva/sclera: Conjunctivae normal.  Neck:     Trachea: No tracheal deviation.  Cardiovascular:     Rate and Rhythm: Normal rate and regular rhythm.  Pulmonary:     Effort: Pulmonary effort is normal. No respiratory distress.     Breath sounds: Normal breath sounds. No stridor. No wheezing or rales.  Abdominal:     General: Bowel sounds are normal. There is no distension.     Palpations: Abdomen is soft.     Tenderness: There is no abdominal tenderness. There is no guarding or rebound.  Musculoskeletal:        General: No tenderness or deformity.     Cervical back: Neck supple.     Right lower leg: No edema.     Left lower leg: No edema.  Skin:    General: Skin is warm and dry.     Findings: No rash.  Neurological:     General: No focal deficit present.     Mental Status: He is alert.     Cranial Nerves: No cranial nerve deficit (no facial droop, extraocular movements intact, no slurred speech).     Sensory: No sensory deficit.     Motor: No abnormal muscle tone or seizure activity.     Coordination: Coordination normal.  Psychiatric:        Mood and Affect: Mood normal.    ED Results / Procedures / Treatments   Labs (all labs ordered are listed, but only abnormal  results are displayed) Labs Reviewed  BASIC METABOLIC PANEL - Abnormal; Notable for the following components:      Result Value   Glucose, Bld 112 (*)    Calcium 8.7 (*)    All other components within normal limits  CBC - Abnormal; Notable for the  following components:   Hemoglobin 11.3 (*)    HCT 36.8 (*)    MCH 25.0 (*)    RDW 16.6 (*)    All other components within normal limits  TROPONIN I (HIGH SENSITIVITY)  TROPONIN I (HIGH SENSITIVITY)    EKG EKG Interpretation  Date/Time:  Saturday August 15 2021 12:34:09 EST Ventricular Rate:  77 PR Interval:  211 QRS Duration: 137 QT Interval:  391 QTC Calculation: 443 R Axis:   81 Text Interpretation: Sinus rhythm Right bundle branch block No significant change since last tracing Confirmed by Dorie Rank [phone]) on 08/15/2021 12:37:29 PM  Radiology CT ANGIO CHEST PE W OR WO CONTRAST  Result Date: 08/15/2021 CLINICAL DATA:  Chest pain.  Elevated D-dimer. EXAM: CT ANGIOGRAPHY CHEST WITH CONTRAST TECHNIQUE: Multidetector CT imaging of the chest was performed using the standard protocol during bolus administration of intravenous contrast. Multiplanar CT image reconstructions and MIPs were obtained to evaluate the vascular anatomy. RADIATION DOSE REDUCTION: This exam was performed according to the departmental dose-optimization program which includes automated exposure control, adjustment of the mA and/or kV according to patient size and/or use of iterative reconstruction technique. CONTRAST:  35mL OMNIPAQUE IOHEXOL 350 MG/ML SOLN COMPARISON:  January 01, 2021. FINDINGS: Cardiovascular: Satisfactory opacification of the pulmonary arteries to the segmental level. No evidence of pulmonary embolism. Normal heart size. No pericardial effusion. Status post coronary bypass graft. Atherosclerosis of thoracic aorta is noted without aneurysm or dissection. Mediastinum/Nodes: Status post esophagectomy with gastric pull-through procedure. No adenopathy is  noted. Thyroid gland is unremarkable. Lungs/Pleura: No pneumothorax or pleural effusion is noted. Emphysematous disease is noted throughout both lungs. Stable 8 mm nodule is noted in right lower lobe which be considered benign at this point. Upper Abdomen: Small gallstone is noted. Musculoskeletal: No chest wall abnormality. No acute or significant osseous findings. Review of the MIP images confirms the above findings. IMPRESSION: No definite evidence of pulmonary embolus. Status post esophagectomy with gastric pull-through procedure. Stable right lower lobe 8 mm nodule is noted which can be considered benign at this point. Small solitary gallstone is noted. Aortic Atherosclerosis (ICD10-I70.0) and Emphysema (ICD10-J43.9). Electronically Signed   By: Marijo Conception M.D.   On: 08/15/2021 10:16    Procedures .1-3 Lead EKG Interpretation Performed by: Dorie Rank, MD Authorized by: Dorie Rank, MD     Interpretation: normal     ECG rate:  70   ECG rate assessment: normal     Rhythm: sinus rhythm     Ectopy: none     Conduction: normal      Medications Ordered in ED Medications  iohexol (OMNIPAQUE) 350 MG/ML injection 75 mL (75 mLs Intravenous Contrast Given 08/15/21 0953)    ED Course/ Medical Decision Making/ A&P Clinical Course as of 08/15/21 1237  Sat Aug 15, 2021  0850 Outside records reviewed.  Patient was seen in the office yesterday for chest pressure difficulty breathing.  Outpatient labs EKG ordered.  Patient also had an elevated D-dimer of 1.48 [JK]  1029 Troponin I (High Sensitivity) Initial troponin normal [JK]  1029 CBC(!) CBC essentially normal with the exception of slight decreased hemoglobin.  Hemoglobin was 10.93 months ago. [JK]  1941 Basic metabolic panel(!) No significant abnormalities [JK]  1030 CT ANGIO CHEST PE W OR WO CONTRAST CT scan images and report reviewed.  No evidence of PE.  No acute findings noted [JK]    Clinical Course User Index [JK] Dorie Rank, MD  Medical Decision Making Amount and/or Complexity of Data Reviewed External Data Reviewed: labs and notes. Labs: ordered. Decision-making details documented in ED Course. Radiology: ordered. Decision-making details documented in ED Course.  Risk Prescription drug management.   Elevated D-dimer Patient sent in for evaluation of chest pain and an outpatient laboratory test that showed an elevated D-dimer.  I was able to review the records from that patient's visit.  Patient had a CT angiogram performed in the ED and fortunately no signs of pulmonary embolism.  No other acute abnormality noted.  Patient did have an incidental gallstone but I do not think that is related to his symptoms.  Chest pain. Patient does have factors for cardiac disease however symptoms have been ongoing for several days now.   serial troponin and ekg reassuring.  I doubt that the symptoms are related to acute cardiac ischemia.  GERD Patient does have history of esophageal reflux disease.  He just recently restarted taking his Dexilant after his doctor instructed him to start doing that again this week.  Suspect this might be the etiology of his symptoms.  We will have him also start taking Carafate.  Evaluation and diagnostic testing in the emergency department does not suggest an emergent condition requiring admission or immediate intervention beyond what has been performed at this time.  The patient is safe for discharge and has been instructed to return immediately for worsening symptoms, change in symptoms or any other concerns.         Final Clinical Impression(s) / ED Diagnoses Final diagnoses:  Chest pain, unspecified type  Gastroesophageal reflux disease, unspecified whether esophagitis present    Rx / DC Orders ED Discharge Orders          Ordered    sucralfate (CARAFATE) 1 GM/10ML suspension  3 times daily with meals & bedtime        08/15/21 1223               Dorie Rank, MD 08/15/21 1237

## 2021-08-15 NOTE — ED Notes (Signed)
Spoke with patient and  he advised that his pain has eased off.

## 2021-08-15 NOTE — Discharge Instructions (Signed)
The test today in the emergency room were reassuring.  No signs of blood clot.  No signs of heart attack.  Continue your Dexilant medication.  Start taking the Carafate to see if that helps.  Follow-up with your doctor to be rechecked

## 2021-08-15 NOTE — ED Triage Notes (Signed)
Pt sent to ED by his PCP for CT scan. Pt c/o chest pain and SOB x 1 week. Pt saw his PCP yesterday and had blood work done which showed an elevated d-dimer. EKG and CXR was done as well and was normal per PCP.

## 2021-08-31 NOTE — Progress Notes (Incomplete)
CARDIOLOGY CONSULT NOTE       Patient ID: Gregory Eaton MRN: [id number] DOB/AGE: 1945/10/23 76 y.o.  Admit date: (Not on file) Referring Physician: Corrington Primary Physician: Curly Rim, MD Primary Cardiologist: Claiborne Billings Reason for Consultation: CAD/CABG  Active Problems:   * No active hospital problems. *   HPI:  76 y.o. referred by DR Corrington for CAD/CABG history New to me previously seen by Dr Claiborne Billings CABG Dr Lawson Fiscal [id number]. History of HLD, HTN, PVD with left vertebral occlusion and right fem pop bypass. Has had esophageal cancer surgery with esophagectomy and gastric pull through procedure Has had claudication symptoms Has had ABI"s  06/15/21 with non compressible vessels abnormal toe pressures and some monophasic wave forms below the knee  Carotid 40-59% left stenosis on duplex 06/15/21   Has not had angina Myovue 02/12/21 non ischemic low risk EF 65%  Sees Dr Early Set up for angiogram 04/01/21 Had left iliac stent placed had patent stent in right external iliac Right femoral occluded with collaterals had PCI of bypass and to have staged procedure for common femoral endarterectomy with Dr Melene Muller  ***  ROS All other systems reviewed and negative except as noted above  Past Medical History:  Diagnosis Date   Adenomatous colon polyp 03/2009   Last colonoscopy by Dr. [name]    Adenomatous polyp 2010   Adenomatous polyp of colon 11/03/2010   Arthritis    Barrett's esophagus    CAD (coronary artery disease)    Complication of anesthesia    has a shortened esophogus due to CA.    COPD (chronic obstructive pulmonary disease) (HCC)    Severe emphysema per CT   Diverticulosis    Emphysema of lung (HCC)    Esophageal carcinoma ([place]) 03/2009   T1N1M0   GERD (gastroesophageal reflux disease)    Glaucoma    History of Doppler ultrasound 11/09/2011   03/2014- 50-69% L ICA stenosis;carotid doppler; L bulb/prox ICA 0-49% diameter reduction; L vertebral  artery - occlusive ds; L ECA  demonstrates severe amount of fibrous plaque   History of Doppler ultrasound 11/09/2011   LEAs; R ABI - mod art. insuff.; L ABI normal at rest; R SFA - occlusive ds; L SFA - occlusive ds; patent fem-pop graft   History of echocardiogram 08/27/2009   EF >55%   History of hiatal hernia    History of kidney stones 1965   History of nuclear stress test 11/24/2011   lexiscan; normal perfusion; low risk scan; non-diagnostic for ischemia   Hyperlipidemia    Hypertension    Left carotid artery stenosis 04/08/2014   Pneumonia    Pre-diabetes    Pulmonary nodule, right 04/08/2014   2.8 mm-incidental finding on CT   PVD (peripheral vascular disease) ([place])    Tachyarrhythmia 1999   Status post ablation at [facility]   Tobacco abuse     Family History  Problem Relation Age of Onset   GER disease Mother    Coronary artery disease Brother    Congenital heart disease Sister    Colon cancer Neg Hx     Social History   Socioeconomic History   Marital status: Married    Spouse name: Not on file   Number of children: 2   Years of education: Not on file   Highest education level: Not on file  Occupational History   Occupation: retired    Fish farm manager: RETIRED    Comment: truck driver  Tobacco Use   Smoking status:  Former    Packs/day: 2.00    Years: 40.00    Pack years: 80.00    Types: Cigarettes    Start date: 51    Quit date: 07/20/1995    Years since quitting: 26.1   Smokeless tobacco: Never  Vaping Use   Vaping Use: Never used  Substance and Sexual Activity   Alcohol use: No    Alcohol/week: 0.0 standard drinks   Drug use: No   Sexual activity: Yes    Partners: Female    Birth control/protection: Condom    Comment: friend  Other Topics Concern   Not on file  Social History Narrative   Not on file   Social Determinants of Health   Financial Resource Strain: Not on file  Food Insecurity: Not on file  Transportation  Needs: Not on file  Physical Activity: Not on file  Stress: Not on file  Social Connections: Not on file  Intimate Partner Violence: Not on file    Past Surgical History:  Procedure Laterality Date   ABDOMINAL AORTOGRAM W/LOWER EXTREMITY N/A 04/01/2021   Procedure: ABDOMINAL AORTOGRAM W/LOWER EXTREMITY;  Surgeon: [name], MD;  Location: [place] [facility];  Service: Cardiovascular;  Laterality: N/A;   BIOPSY  10/24/2017   Procedure: BIOPSY;  Surgeon: [name], MD;  [facility];  Service: Endoscopy;;  esophagus   CATARACT EXTRACTION Bilateral    COLONOSCOPY  03/17/2009   Dr.Rourk- normal rectum, sigmoid diverticula, some pale sigmoid mucosa with diffuse petechiae. pedunculated polyp at the splenic flexure, remainder of colonic mucosa appeared normal. bx= adenomatous polyp   COLONOSCOPY  04/19/2003   Dr.Rehman- few diverticu;a at the sigmoid colon, 3 small polyps, one at the transverse colon and 2 at the sigmoid, small external hemorrhoids. bx report not available.    COLONOSCOPY WITH PROPOFOL N/A 10/24/2017   Dr. [name]: Diverticulosis, 11 mm polyp at the ileocecal valve, tubular adenoma.  Repeat colonoscopy in 3 years   CORONARY ARTERY BYPASS GRAFT  1998   [name]   ENDARTERECTOMY FEMORAL Right 04/13/2021   Procedure: RIGHT ILIOFEMORAL ARTERY ENDARTERECTOMY WITH PATCH ANGIOPLASTY USING HEMASHIELD [place];  Surgeon: [name], MD;  Location: [place];  Service: Vascular;  Laterality: Right;   ESOPHAGECTOMY  2010   [facility] Dr. [name]   ESOPHAGOGASTRODUODENOSCOPY  11/25/2010   Dr.Rourk- s/p esophagectomy with gastric pull-up, esophageal erosions straddling the surgical anastomosis, salmon colored epithelium coming up a good centimeter to a centimeter and a half above the suture line, islands of salmon colored epithelium in the most poximal residual esophagus, remainder of gastric mucosa appeared normal. bx= swamous &gastric  glandular mucosa w/chronic active inflammation   ESOPHAGOGASTRODUODENOSCOPY  03/17/2009   Dr.Rourk- 4cm segment of salmon-colored epithlium distal esophagus suspicious for barretts esophagus. area of suspicious nodularity w/in this segment bx seperately. small to moderate size hiatal hernia, o/w normal stomach D1 and D2 bx=adenocarcinoma   ESOPHAGOGASTRODUODENOSCOPY (EGD) WITH PROPOFOL N/A 10/24/2017   Dr. [name]: Remnant of esophagus with anastomosis with stomach at 24 cm from the incisors, 2 cm above the anastomosis he was found to have Barrett's but no dysplasia.  Advised for repeat EGD in 3 years   EYE SURGERY     cataract   FEMORAL-POPLITEAL BYPASS GRAFT  1993   occlusive ds in R SFA   FEMORAL-POPLITEAL BYPASS GRAFT Left 05/11/2021   Procedure: LEFT FEMORAL-BELOW KNEE POPLITEAL ARTERY BYPASS GRAFT WITH 6 mm PTFE and COMPLETION ANGIOGRAM;  Surgeon: [name],  MD;  Location: [place];  Service: Vascular;  Laterality: Left;   GASTRIC PULL THROUGH  2010   With esophagectomy   INSERTION OF ILIAC STENT Bilateral 04/13/2021   Procedure: INSERTION OF BILATERAL COMMON ILIAC KISSING STENTS AND INSERION OF RIGHT EXTERNAL ILIAC STENT;  Surgeon: [name], MD;  [facility];  Service: Vascular;  Laterality: Bilateral;   JOINT REPLACEMENT     LIPOMA EXCISION  2010   LOWER EXTREMITY ANGIOGRAM Left 04/13/2021   Procedure: LEFT LOWER EXTREMITY ANGIOGRAM ;  Surgeon: [name], MD;  Location: [place];  Service: Vascular;  Laterality: Left;   PERIPHERAL VASCULAR INTERVENTION  04/01/2021   Procedure: PERIPHERAL VASCULAR INTERVENTION;  Surgeon: [name], MD;  Location: [place] [facility];  Service: Cardiovascular;;   POLYPECTOMY  10/24/2017   Procedure: POLYPECTOMY;  Surgeon: [name], MD;  [facility];  Service: Endoscopy;;  colon    TOTAL HIP ARTHROPLASTY Left 01/30/2019   Procedure: TOTAL HIP ARTHROPLASTY ANTERIOR APPROACH;  Surgeon: Renette Butters, MD;  Location: [facility];  Service: Orthopedics;  Laterality: Left;   ULTRASOUND GUIDANCE FOR VASCULAR ACCESS Left 04/13/2021   Procedure: ULTRASOUND GUIDANCE FOR VASCULAR ACCESS, left femoral;  Surgeon: [name], MD;  Location: [place];  Service: Vascular;  Laterality: Left;      Current Outpatient Medications:    acetaminophen (TYLENOL) 500 MG tablet, Take 1,000 mg by mouth daily as needed for headache., Disp: , Rfl:    albuterol (PROVENTIL) (2.5 MG/3ML) 0.083% nebulizer solution, Take 3 mLs (2.5 mg total) by nebulization every 6 (six) hours as needed for wheezing or shortness of breath. DX: J44.9, Disp: 75 mL, Rfl: 6   ALPRAZolam (XANAX) 0.5 MG tablet, Take 1 tablet (0.5 mg total) by mouth at bedtime., Disp: 30 tablet, Rfl: 0   amLODipine (NORVASC) 10 MG tablet, TAKE ONE (1) TABLET BY MOUTH EVERY DAY, Disp: 90 tablet, Rfl: 3   aspirin EC 81 MG tablet, Take 1 tablet (81 mg total) by mouth daily., Disp: , Rfl:    baclofen (LIORESAL) 10 MG tablet, Take 1 tablet (10 mg total) by mouth 3 (three) times daily as needed for muscle spasms., Disp: 20 each, Rfl: 0   clopidogrel (PLAVIX) 75 MG tablet, TAKE ONE (1) TABLET BY MOUTH EVERY DAY, Disp: 90 tablet, Rfl: 0   dexlansoprazole (DEXILANT) 60 MG capsule, Take 1 capsule (60 mg total) by mouth as needed (takes 2-3 times per week)., Disp: , Rfl:    Glycopyrrolate-Formoterol (BEVESPI AEROSPHERE) 9-4.8 MCG/ACT AERO, Inhale 2 puffs into the lungs 2 (two) times daily. (Patient taking differently: Inhale 2 puffs into the lungs 2 (two) times daily as needed (shortness of breath).), Disp: 5.9 g, Rfl: 6   guaiFENesin (MUCINEX) 600 MG 12 hr tablet, Take 600 mg by mouth every 12 (twelve) hours as needed (cough)., Disp: , Rfl:    latanoprost (XALATAN) 0.005 % ophthalmic solution, Place 1 drop into both eyes at bedtime. , Disp: , Rfl:    losartan (COZAAR) 100 MG tablet, TAKE ONE TABLET BY MOUTH EVERY NIGHT AT BEDTIME, Disp: 90 tablet, Rfl: 3    metoprolol succinate (TOPROL-XL) 50 MG 24 hr tablet, TAKE ONE (1) TABLET BY MOUTH EVERY DAY, Disp: 90 tablet, Rfl: 1   montelukast (SINGULAIR) 10 MG tablet, Take 1 tablet (10 mg total) by mouth daily., Disp: 90 tablet, Rfl: 4   Multiple Vitamins-Minerals (CENTRUM SILVER PO), Take 1 tablet by mouth daily., Disp: , Rfl:  nitroGLYCERIN (NITROSTAT) 0.4 MG SL tablet, Place 1 tablet (0.4 mg total) under the tongue every 5 (five) minutes as needed for chest pain., Disp: 25 tablet, Rfl: 0   oxyCODONE-acetaminophen (PERCOCET/ROXICET) 5-325 MG tablet, Take 1 tablet by mouth every 4 (four) hours as needed for moderate pain., Disp: 20 tablet, Rfl: 0   rosuvastatin (CRESTOR) 20 MG tablet, Take 1 tablet (20 mg total) by mouth daily., Disp: 30 tablet, Rfl: 3   sucralfate (CARAFATE) 1 GM/10ML suspension, Take 10 mLs (1 g total) by mouth 4 (four) times daily -  with meals and at bedtime., Disp: 420 mL, Rfl: 0    Physical Exam: There were no vitals taken for this visit.    Affect appropriate Healthy:  appears stated age 60: normal Neck supple with no adenopathy JVP normal no bruits no thyromegaly Lungs clear with no wheezing and good diaphragmatic motion Heart:  S1/S2 no murmur, no rub, gallop or click PMI normal post sternotomy Abdomen: benighn, BS positve, no tenderness, no AAA no bruit.  No HSM or HJR Post left fem pop with decreased pedal pulses on right  No edema Neuro non-focal Skin warm and dry No muscular weakness   Labs:   Lab Results  Component Value Date   WBC 5.7 08/15/2021   HGB 11.3 (L) 08/15/2021   HCT 36.8 (L) 08/15/2021   MCV 81.4 08/15/2021   PLT 336 08/15/2021   No results for input(s): NA, K, CL, CO2, BUN, CREATININE, CALCIUM, PROT, BILITOT, ALKPHOS, ALT, AST, GLUCOSE in the last 168 hours.  Invalid input(s): LABALBU Lab Results  Component Value Date   TROPONINI <0.30 04/08/2014    Lab Results  Component Value Date   CHOL 69 05/12/2021   CHOL 96  01/31/2013   Lab Results  Component Value Date   HDL 30 (L) 05/12/2021   HDL 37 (L) 01/31/2013   Lab Results  Component Value Date   LDLCALC 27 05/12/2021   LDLCALC 43 01/31/2013   Lab Results  Component Value Date   TRIG 58 05/12/2021   TRIG 78 01/31/2013   Lab Results  Component Value Date   CHOLHDL 2.3 05/12/2021   CHOLHDL 2.6 01/31/2013   No results found for: LDLDIRECT    Radiology: DG Chest 2 View  Result Date: 08/16/2021 CLINICAL DATA:  Wheezing with shortness of breath. EXAM: CHEST - 2 VIEW COMPARISON:  Chest x-ray 01/01/2021. FINDINGS: Cardiomediastinal silhouette is within normal limits. Patient is status post cardiac surgery. There is no lung consolidation, pleural effusion or pneumothorax. The lungs are hyperinflated compatible with emphysema, unchanged. No acute fractures are seen. IMPRESSION: 1. No acute cardiopulmonary process. 2. Emphysema. Electronically Signed   By: Ronney Asters M.D.   On: 08/16/2021 19:55   CT ANGIO CHEST PE W OR WO CONTRAST  Result Date: 08/15/2021 CLINICAL DATA:  Chest pain.  Elevated D-dimer. EXAM: CT ANGIOGRAPHY CHEST WITH CONTRAST TECHNIQUE: Multidetector CT imaging of the chest was performed using the standard protocol during bolus administration of intravenous contrast. Multiplanar CT image reconstructions and MIPs were obtained to evaluate the vascular anatomy. RADIATION DOSE REDUCTION: This exam was performed according to the departmental dose-optimization program which includes automated exposure control, adjustment of the mA and/or kV according to patient size and/or use of iterative reconstruction technique. CONTRAST:  64mL OMNIPAQUE IOHEXOL 350 MG/ML SOLN COMPARISON:  January 01, 2021. FINDINGS: Cardiovascular: Satisfactory opacification of the pulmonary arteries to the segmental level. No evidence of pulmonary embolism. Normal heart size. No pericardial effusion. Status post  coronary bypass graft. Atherosclerosis of thoracic aorta is  noted without aneurysm or dissection. Mediastinum/Nodes: Status post esophagectomy with gastric pull-through procedure. No adenopathy is noted. Thyroid gland is unremarkable. Lungs/Pleura: No pneumothorax or pleural effusion is noted. Emphysematous disease is noted throughout both lungs. Stable 8 mm nodule is noted in right lower lobe which be considered benign at this point. Upper Abdomen: Small gallstone is noted. Musculoskeletal: No chest wall abnormality. No acute or significant osseous findings. Review of the MIP images confirms the above findings. IMPRESSION: No definite evidence of pulmonary embolus. Status post esophagectomy with gastric pull-through procedure. Stable right lower lobe 8 mm nodule is noted which can be considered benign at this point. Small solitary gallstone is noted. Aortic Atherosclerosis (ICD10-I70.0) and Emphysema (ICD10-J43.9). Electronically Signed   By: Marijo Conception M.D.   On: 08/15/2021 10:16    EKG: ***   ASSESSMENT AND PLAN:   CAD/CABG:  Distant with non ischemic myovue 02/12/21 continue medical Rx PVD:  F/U Dr Unk Lightning post stenting with previous left fem pop in need of staged right femoral endarterectomy  Esophageal cancer:  post surgery f/u oncology HLD:  on statin labs with primary  HTN:  Well controlled.  Continue current medications and low sodium Dash type diet.      F/U Dr Unk Lightning vascular F/U cardiology in a year   Signed: Jenkins Rouge 08/31/2021, 7:57 AM

## 2021-09-03 ENCOUNTER — Other Ambulatory Visit: Payer: Self-pay

## 2021-09-03 ENCOUNTER — Encounter: Payer: Self-pay | Admitting: Pulmonary Disease

## 2021-09-03 ENCOUNTER — Ambulatory Visit: Payer: Medicare Other | Admitting: Pulmonary Disease

## 2021-09-03 ENCOUNTER — Ambulatory Visit: Payer: Medicare Other | Admitting: Cardiovascular Disease

## 2021-09-03 DIAGNOSIS — J449 Chronic obstructive pulmonary disease, unspecified: Secondary | ICD-10-CM | POA: Diagnosis not present

## 2021-09-03 MED ORDER — MONTELUKAST SODIUM 10 MG PO TABS: 10.0000 mg | ORAL_TABLET | Freq: Every day | ORAL | 4 refills | Status: DC

## 2021-09-03 MED ORDER — ANORO ELLIPTA 62.5-25 MCG/ACT IN AEPB: 1.0000 | INHALATION_SPRAY | Freq: Every day | RESPIRATORY_TRACT | 5 refills | Status: DC

## 2021-09-03 NOTE — Patient Instructions (Addendum)
Moderately severe COPD (FEV1 59%): GOLD Class B --STOP Bevespi --START Anoro ONE puff ONCE a day --CONTINUE montelukast 10 mg daily. REFILL --CONTINUE Albuterol as needed for shortness of breath or wheezing. REFILL --ORDER spirometry with bronchodilators  Follow-up with me in March/April with PFTs prior to visit

## 2021-09-03 NOTE — Progress Notes (Signed)
Subjective:   PATIENT ID: Gregory Eaton GENDER: male DOB: 12-18-1945, MRN: [id number]   HPI  Chief Complaint  Patient presents with   Follow-up    sobe    Reason for Visit: Follow-up   Mr. Fleetwood Pierron is a 76 year old male former smoker with COPD (FEV1 59%), status post CABG, hypertension, PVD and history of esophageal cancer status post esophagectomy and gastric pull-through in 2010 who presents for follow-up.  11/05/20 He reports his COPD is very well controlled. Denies any recent exacerbations requiring steroids or antibiotics. No ED/urgent care visit. Compliant with his Bevespi. He has used his albuterol twice since our last visit six months ago. He still has shortness of breath when walking uphill but feels that his legs have decreased circulation that may be contributing. Denies cough and wheezing.  09/03/21 Since our last visit, he reports his COPD has worsened but unsure if this is related to his reflux or heart. Denies coughing or wheezing. Has chest tightness. He is able to work in the yard and lift heavy loads without issue. No limitations in activity. He has been trying to see his Cardiologist. He has not been taking Bevespi that will cause chest discomfort so he is only taking it 1-2 days a week. Has not used his rescue inhaler.  He underwent a left femoral to below-knee popliteal arterial bypass on 05/11/2021. No issues except for needing a course of antibiotics post-procedure for soft tissue infection. More recently, He was seen in the ED on 08/07/2021.  Notes were reviewed.  He presented with chest pain and shortness of breath for 1 week.  D-dimer was elevated so was sent to the ED for further work-up.  CTA was negative for PE  Social History: 60-pack-year smoking history. Quit in 1997 Previous employed as a Administrator, previously did Dealer work  Programme researcher, broadcasting/film/video exposures: Hx of Architect x 2 years  I have personally reviewed patient's past  medical/family/social history/allergies/current medications.  Past Medical History:  Diagnosis Date   Adenomatous colon polyp 03/2009   Last colonoscopy by Dr. [name]    Adenomatous polyp 2010   Adenomatous polyp of colon 11/03/2010   Arthritis    Barrett's esophagus    CAD (coronary artery disease)    Complication of anesthesia    has a shortened esophogus due to CA.    COPD (chronic obstructive pulmonary disease) (HCC)    Severe emphysema per CT   Diverticulosis    Emphysema of lung (HCC)    Esophageal carcinoma ([place]) 03/2009   T1N1M0   GERD (gastroesophageal reflux disease)    Glaucoma    History of Doppler ultrasound 11/09/2011   03/2014- 50-69% L ICA stenosis;carotid doppler; L bulb/prox ICA 0-49% diameter reduction; L vertebral artery - occlusive ds; L ECA  demonstrates severe amount of fibrous plaque   History of Doppler ultrasound 11/09/2011   LEAs; R ABI - mod art. insuff.; L ABI normal at rest; R SFA - occlusive ds; L SFA - occlusive ds; patent fem-pop graft   History of echocardiogram 08/27/2009   EF >55%   History of hiatal hernia    History of kidney stones 1965   History of nuclear stress test 11/24/2011   lexiscan; normal perfusion; low risk scan; non-diagnostic for ischemia   Hyperlipidemia    Hypertension    Left carotid artery stenosis 04/08/2014   Pneumonia    Pre-diabetes    Pulmonary nodule, right 04/08/2014   2.8 mm-incidental finding on CT   PVD (peripheral  vascular disease) ([place])    Tachyarrhythmia 1999   Status post ablation at [facility]   Tobacco abuse     Outpatient Medications Prior to Visit  Medication Sig Dispense Refill   acetaminophen (TYLENOL) 500 MG tablet Take 1,000 mg by mouth daily as needed for headache.     albuterol (PROVENTIL) (2.5 MG/3ML) 0.083% nebulizer solution Take 3 mLs (2.5 mg total) by nebulization every 6 (six) hours as needed for wheezing or shortness of breath. DX: J44.9 75 mL 6   ALPRAZolam (XANAX) 0.5 MG tablet Take 1  tablet (0.5 mg total) by mouth at bedtime. 30 tablet 0   amLODipine (NORVASC) 10 MG tablet TAKE ONE (1) TABLET BY MOUTH EVERY DAY 90 tablet 3   aspirin EC 81 MG tablet Take 1 tablet (81 mg total) by mouth daily.     baclofen (LIORESAL) 10 MG tablet Take 1 tablet (10 mg total) by mouth 3 (three) times daily as needed for muscle spasms. 20 each 0   clopidogrel (PLAVIX) 75 MG tablet TAKE ONE (1) TABLET BY MOUTH EVERY DAY 90 tablet 0   dexlansoprazole (DEXILANT) 60 MG capsule Take 1 capsule (60 mg total) by mouth as needed (takes 2-3 times per week).     guaiFENesin (MUCINEX) 600 MG 12 hr tablet Take 600 mg by mouth every 12 (twelve) hours as needed (cough).     latanoprost (XALATAN) 0.005 % ophthalmic solution Place 1 drop into both eyes at bedtime.      losartan (COZAAR) 100 MG tablet TAKE ONE TABLET BY MOUTH EVERY NIGHT AT BEDTIME 90 tablet 3   metoprolol succinate (TOPROL-XL) 50 MG 24 hr tablet TAKE ONE (1) TABLET BY MOUTH EVERY DAY 90 tablet 1   Multiple Vitamins-Minerals (CENTRUM SILVER PO) Take 1 tablet by mouth daily.     nitroGLYCERIN (NITROSTAT) 0.4 MG SL tablet Place 1 tablet (0.4 mg total) under the tongue every 5 (five) minutes as needed for chest pain. 25 tablet 0   oxyCODONE-acetaminophen (PERCOCET/ROXICET) 5-325 MG tablet Take 1 tablet by mouth every 4 (four) hours as needed for moderate pain. 20 tablet 0   rosuvastatin (CRESTOR) 20 MG tablet Take 1 tablet (20 mg total) by mouth daily. 30 tablet 3   sucralfate (CARAFATE) 1 GM/10ML suspension Take 10 mLs (1 g total) by mouth 4 (four) times daily -  with meals and at bedtime. 420 mL 0   montelukast (SINGULAIR) 10 MG tablet Take 1 tablet (10 mg total) by mouth daily. 90 tablet 4   Glycopyrrolate-Formoterol (BEVESPI AEROSPHERE) 9-4.8 MCG/ACT AERO Inhale 2 puffs into the lungs 2 (two) times daily. (Patient not taking: Reported on 09/03/2021) 5.9 g 6   No facility-administered medications prior to visit.    Review of Systems   Constitutional:  Negative for chills, diaphoresis, fever, malaise/fatigue and weight loss.  HENT:  Negative for congestion.   Respiratory:  Positive for shortness of breath. Negative for cough, hemoptysis, sputum production and wheezing.   Cardiovascular:  Negative for chest pain, palpitations and leg swelling.    Objective:   Vitals:   09/03/21 1022  BP: 136/62  Pulse: 70  Temp: 97.7 F (36.5 C)  TempSrc: Oral  SpO2: 97%  Weight: 153 lb (69.4 kg)  Height: 5\' 8"  (1.727 m)  SpO2: 97 % O2 Device: None (Room air)  Physical Exam: General: Well-appearing, no acute distress HENT: [place], AT Eyes: EOMI, no scleral icterus Respiratory: Clear to auscultation bilaterally.  No crackles, wheezing or rales Cardiovascular: RRR, -M/R/G, no  JVD Extremities:-Edema,-tenderness Neuro: AAO x4, CNII-XII grossly intact Psych: Normal mood, normal affect   Data Reviewed:  Imaging: CT Chest 11/22/17 - Centrilobular and paraseptal emphysema. 79mm nodule in the RLL, unchanged since 2015 CTA 08/15/2021-no pulmonary embolism.  Stable right lower lobe 8 mm nodule.  Emphysema.  PFT: 03/23/2016 FVC 4.1 L (101 %) FEV1 1.8 (59 %) Ratio 58  Interpretation: Moderately severe obstructive defect.  Normal vital capacity.    Assessment & Plan:   Discussion: 76 year old male with moderately severe COPD, history of esophageal CA s/p esophagectomy, hx CABG and PVD on DAPT who presents for follow-up. Worsening symptoms but no exacerbations >12 months. Of note his baseline function is excellent with no limitations in activity. Prefers not to use steroids. Not tolerating Bevespi so will switch to Anoro. Will also reevaluation spirometry to see if his COPD has progressed.   Moderately severe COPD (FEV1 59%): GOLD Class B, mMRC <2 --STOP Bevespi --START Anoro ONE puff ONCE a day --CONTINUE montelukast 10 mg daily. REFILL --CONTINUE Albuterol as needed for shortness of breath or wheezing. REFILL --ORDER spirometry with  bronchodilators  RLL lung nodule: Stable 60mm nodule, unchanged since 2015 No further imaging indicated  Health Maintenance Immunization History  Administered Date(s) Administered   Influenza Inj Mdck Quad Pf 04/17/2016   Influenza, High Dose Seasonal PF 05/09/2017, 04/21/2018, 04/19/2019   Influenza,inj,Quad PF,6+ Mos 04/19/2015, 04/17/2016   Influenza-Unspecified 05/29/2010, 05/01/2014   Moderna Sars-Covid-2 Vaccination 08/23/2019, 09/21/2019, 05/16/2020   Pneumococcal Conjugate-13 04/01/2014   Pneumococcal Polysaccharide-23 04/17/2016   Td 05/17/2012   CT Lung Screen - Not qualified  Orders Placed This Encounter  Procedures   Pulmonary function test    Standing Status:   Future    Standing Expiration Date:   09/03/2022    Scheduling Instructions:     spirometry with bronchodilators only    Order Specific Question:   Where should this test be performed?    Answer:   [facility]   Meds ordered this encounter  Medications   umeclidinium-vilanterol (ANORO ELLIPTA) 62.5-25 MCG/ACT AEPB    Sig: Inhale 1 puff into the lungs daily.    Dispense:  60 each    Refill:  5   montelukast (SINGULAIR) 10 MG tablet    Sig: Take 1 tablet (10 mg total) by mouth daily.    Dispense:  90 tablet    Refill:  4   Return in about 1 month (around 10/01/2021). After PFTs   I have spent a total time of 35-minutes on the day of the appointment reviewing prior documentation, coordinating care and discussing medical diagnosis and plan with the patient/family. Past medical history, allergies, medications were reviewed. Pertinent imaging, labs and tests included in this note have been reviewed and interpreted independently by me.  [place], MD [facility] 09/03/2021 10:38 AM  Office Number [phone]

## 2021-09-07 ENCOUNTER — Encounter: Payer: Self-pay | Admitting: Gastroenterology

## 2021-09-07 ENCOUNTER — Ambulatory Visit: Payer: Medicare Other | Admitting: Gastroenterology

## 2021-09-07 ENCOUNTER — Other Ambulatory Visit: Payer: Self-pay

## 2021-09-07 VITALS — BP 122/68 | HR 69 | Temp 97.3°F | Ht 68.0 in | Wt 151.6 lb

## 2021-09-07 DIAGNOSIS — K219 Gastro-esophageal reflux disease without esophagitis: Secondary | ICD-10-CM

## 2021-09-07 DIAGNOSIS — K2270 Barrett's esophagus without dysplasia: Secondary | ICD-10-CM | POA: Diagnosis not present

## 2021-09-07 DIAGNOSIS — K5900 Constipation, unspecified: Secondary | ICD-10-CM | POA: Diagnosis not present

## 2021-09-07 DIAGNOSIS — Z86010 Personal history of colonic polyps: Secondary | ICD-10-CM

## 2021-09-07 MED ORDER — SUCRALFATE 1 G PO TABS: 1.0000 g | ORAL_TABLET | Freq: Three times a day (TID) | ORAL | 0 refills | Status: DC

## 2021-09-07 MED ORDER — LANSOPRAZOLE 30 MG PO CPDR: 30.0000 mg | DELAYED_RELEASE_CAPSULE | Freq: Every day | ORAL | 3 refills | Status: DC

## 2021-09-07 NOTE — Patient Instructions (Signed)
Stop Dexilant.  Start lansoprazole 30 mg before evening meal daily. Prescription for Carafate (sucralfate) tablets sent to your pharmacy since the liquid was too expensive.  You can crush the tablet and 2 tablespoons of water to make your own suspension.  This sometimes works faster for esophageal/stomach pain.  You should use only when needed before meals and at bedtime, this is not a chronic medication. Continue MiraLAX 1 capful daily as needed to manage constipation.  Avoid straining. We will be in touch once Dr. [name] returns to schedule upper endoscopy.  I will discuss with him if he advises you to pursue another colonoscopy and we will let you know.  Anticipate further information later next month. Call in the meantime if you have any questions or concerns.

## 2021-09-07 NOTE — Progress Notes (Signed)
Primary Care Physician: Curly Rim, MD  Primary Gastroenterologist:  Garfield Cornea, MD   Chief Complaint  Patient presents with   Chest Pain    Went to ED. Not sure if it was from reflux or COPD. Seen pulmonary and started new med. Feeling better now   Gastroesophageal Reflux    Occurs at night. Has bed elevated. Has been taking Dexilant daily. Wants 90 days supply of Carafate liquid-has been out for a while    HPI: Gregory Eaton is a 76 y.o. male here with past medical history significant for esophageal carcinoma in 2010 status post esophagectomy with gastric pull-up, Barrett's esophagus diagnosed 2019 with no dysplasia, GERD, colonic adenomas here for follow-up.    EGD and colonoscopy last year was postponed due to syncopal episode requiring hospitalization.  Presented with blood pressure of 65/34 and felt to be volume depleted.  His amlodipine and spironolactone were discontinued.  He was in normal sinus rhythm during overnight admission.  Amlodipine restarted and titrated up slowly.  No further episodes of syncope. We saw him in 03/2021 but we had to postpone rescheduling his procedures because of new stents requiring uninterrupted Plavix.   Since his last visit, he has had multiple vascular procedures.  April 01, 2021 he had left external iliac stent placement.  On April 13, 2021 he had right iliofemoral endarterectomy, bilateral common iliac artery covered stents placed, right external iliac artery covered stent placed.  May 11, 2021 he underwent left-sided femoral to below-knee popliteal artery bypass.  He did not have an appropriate vein to use for harvesting because of prior harvest for CABG and prior bypass, therefore 6 mm ringed PTFE was utilized.  From a GI standpoint, he has mild constipation which is usually managed with MiraLAX.  He generally takes MiraLAX about 3 times per week.  Usually no straining.  No melena.  No recent rectal bleeding.  Denies  abdominal pain.  He denies heartburn type symptoms.  Sometimes when he takes a deep breath he feels discomfort in the chest down into the epigastrium.  Query adhesions.  He continues to have nocturnal regurgitation but fluid does not burn.  He sleeps with the head of his bed elevated.  Suddenly has any dysphagia.  No weight loss.  Since his esophagectomy with gastric pull-through he has nocturnal regurgitation. He has to wait least 5 to 6 hours between meals and laying down.  In the past he has found Dexilant most helpful.  However when he is doing well he tends to not take the medication because it is so expensive.    Recently seen in the ED for chest pain and shortness of breath in the setting of outpatient elevated D-dimer, 1.48.  CTA chest negative for pulmonary embolus.  Incidentally found to have a gallstone.  Serial troponins and EKG findings reassuring.  Chest x-ray showed emphysema.  It was suspected his symptoms were related to GERD as he admitted to being off his [place] for quite some time and just recently restarting it when he started having symptoms.  He since saw his pulmonologist who started him on Anoro Ellipta which she feels has made some improvement in his breathing.  Patient had normal hemoglobin prior to his surgery in September/October.  He states he did receive blood transfusions with his last procedure.  Hemoglobin got down to 8.2.  At discharge on October 25 his hemoglobin was 10.9.  In the ED on January 28 his hemoglobin was 11.3.  Current Outpatient Medications  Medication Sig Dispense Refill   acetaminophen (TYLENOL) 500 MG tablet Take 1,000 mg by mouth daily as needed for headache.     albuterol (PROVENTIL) (2.5 MG/3ML) 0.083% nebulizer solution Take 3 mLs (2.5 mg total) by nebulization every 6 (six) hours as needed for wheezing or shortness of breath. DX: J44.9 75 mL 6   ALPRAZolam (XANAX) 0.5 MG tablet Take 1 tablet (0.5 mg total) by mouth at bedtime. 30 tablet 0    amLODipine (NORVASC) 10 MG tablet TAKE ONE (1) TABLET BY MOUTH EVERY DAY 90 tablet 3   aspirin EC 81 MG tablet Take 1 tablet (81 mg total) by mouth daily.     baclofen (LIORESAL) 10 MG tablet Take 1 tablet (10 mg total) by mouth 3 (three) times daily as needed for muscle spasms. 20 each 0   clopidogrel (PLAVIX) 75 MG tablet TAKE ONE (1) TABLET BY MOUTH EVERY DAY 90 tablet 0   dexlansoprazole (DEXILANT) 60 MG capsule Take 1 capsule (60 mg total) by mouth as needed (takes 2-3 times per week). (Patient taking differently: Take 60 mg by mouth daily.)     guaiFENesin (MUCINEX) 600 MG 12 hr tablet Take 600 mg by mouth every 12 (twelve) hours as needed (cough).     latanoprost (XALATAN) 0.005 % ophthalmic solution Place 1 drop into both eyes at bedtime.      losartan (COZAAR) 100 MG tablet TAKE ONE TABLET BY MOUTH EVERY NIGHT AT BEDTIME 90 tablet 3   metoprolol succinate (TOPROL-XL) 50 MG 24 hr tablet TAKE ONE (1) TABLET BY MOUTH EVERY DAY 90 tablet 1   montelukast (SINGULAIR) 10 MG tablet Take 1 tablet (10 mg total) by mouth daily. 90 tablet 4   Multiple Vitamins-Minerals (CENTRUM SILVER PO) Take 1 tablet by mouth daily.     nitroGLYCERIN (NITROSTAT) 0.4 MG SL tablet Place 1 tablet (0.4 mg total) under the tongue every 5 (five) minutes as needed for chest pain. 25 tablet 0   rosuvastatin (CRESTOR) 20 MG tablet Take 1 tablet (20 mg total) by mouth daily. 30 tablet 3   umeclidinium-vilanterol (ANORO ELLIPTA) 62.5-25 MCG/ACT AEPB Inhale 1 puff into the lungs daily. 60 each 5   sucralfate (CARAFATE) 1 GM/10ML suspension Take 10 mLs (1 g total) by mouth 4 (four) times daily -  with meals and at bedtime. (Patient not taking: Reported on 09/07/2021) 420 mL 0   No current facility-administered medications for this visit.    Allergies as of 09/07/2021 - Review Complete 09/07/2021  Allergen Reaction Noted   Altace [ramipril] Cough 04/07/2014   Past Medical History:  Diagnosis Date   Adenomatous colon polyp  03/2009   Last colonoscopy by Dr. [name]    Adenomatous polyp 2010   Adenomatous polyp of colon 11/03/2010   Arthritis    Barrett's esophagus    CAD (coronary artery disease)    Complication of anesthesia    has a shortened esophogus due to CA.    COPD (chronic obstructive pulmonary disease) (HCC)    Severe emphysema per CT   Diverticulosis    Emphysema of lung (HCC)    Esophageal carcinoma ([place]) 03/2009   T1N1M0   GERD (gastroesophageal reflux disease)    Glaucoma    History of Doppler ultrasound 11/09/2011   03/2014- 50-69% L ICA stenosis;carotid doppler; L bulb/prox ICA 0-49% diameter reduction; L vertebral artery - occlusive ds; L ECA  demonstrates severe amount of fibrous plaque   History of Doppler ultrasound 11/09/2011  LEAs; R ABI - mod art. insuff.; L ABI normal at rest; R SFA - occlusive ds; L SFA - occlusive ds; patent fem-pop graft   History of echocardiogram 08/27/2009   EF >55%   History of hiatal hernia    History of kidney stones 1965   History of nuclear stress test 11/24/2011   lexiscan; normal perfusion; low risk scan; non-diagnostic for ischemia   Hyperlipidemia    Hypertension    Left carotid artery stenosis 04/08/2014   Pneumonia    Pre-diabetes    Pulmonary nodule, right 04/08/2014   2.8 mm-incidental finding on CT   PVD (peripheral vascular disease) ([place])    Tachyarrhythmia 1999   Status post ablation at [facility]   Tobacco abuse    Past Surgical History:  Procedure Laterality Date   ABDOMINAL AORTOGRAM W/LOWER EXTREMITY N/A 04/01/2021   Procedure: ABDOMINAL AORTOGRAM W/LOWER EXTREMITY;  Surgeon: [name], MD;  Location: [place] [facility];  Service: Cardiovascular;  Laterality: N/A;   BIOPSY  10/24/2017   Procedure: BIOPSY;  Surgeon: [name], MD;  [facility];  Service: Endoscopy;;  esophagus   CATARACT EXTRACTION Bilateral    COLONOSCOPY  03/17/2009   Dr.Rourk- normal rectum, sigmoid diverticula, some pale sigmoid mucosa  with diffuse petechiae. pedunculated polyp at the splenic flexure, remainder of colonic mucosa appeared normal. bx= adenomatous polyp   COLONOSCOPY  04/19/2003   Dr.Rehman- few diverticu;a at the sigmoid colon, 3 small polyps, one at the transverse colon and 2 at the sigmoid, small external hemorrhoids. bx report not available.    COLONOSCOPY WITH PROPOFOL N/A 10/24/2017   Dr. [name]: Diverticulosis, 11 mm polyp at the ileocecal valve, tubular adenoma.  Repeat colonoscopy in 3 years   CORONARY ARTERY BYPASS GRAFT  1998   [name]   ENDARTERECTOMY FEMORAL Right 04/13/2021   Procedure: RIGHT ILIOFEMORAL ARTERY ENDARTERECTOMY WITH PATCH ANGIOPLASTY USING HEMASHIELD [place];  Surgeon: [name], MD;  Location: [place];  Service: Vascular;  Laterality: Right;   ESOPHAGECTOMY  2010   [facility] Dr. [name]   ESOPHAGOGASTRODUODENOSCOPY  11/25/2010   Dr.Rourk- s/p esophagectomy with gastric pull-up, esophageal erosions straddling the surgical anastomosis, salmon colored epithelium coming up a good centimeter to a centimeter and a half above the suture line, islands of salmon colored epithelium in the most poximal residual esophagus, remainder of gastric mucosa appeared normal. bx= swamous &gastric glandular mucosa w/chronic active inflammation   ESOPHAGOGASTRODUODENOSCOPY  03/17/2009   Dr.Rourk- 4cm segment of salmon-colored epithlium distal esophagus suspicious for barretts esophagus. area of suspicious nodularity w/in this segment bx seperately. small to moderate size hiatal hernia, o/w normal stomach D1 and D2 bx=adenocarcinoma   ESOPHAGOGASTRODUODENOSCOPY (EGD) WITH PROPOFOL N/A 10/24/2017   Dr. [name]: Remnant of esophagus with anastomosis with stomach at 24 cm from the incisors, 2 cm above the anastomosis he was found to have Barrett's but no dysplasia.  Advised for repeat EGD in 3 years   EYE SURGERY     cataract   FEMORAL-POPLITEAL BYPASS GRAFT  1993   occlusive ds in  R SFA   FEMORAL-POPLITEAL BYPASS GRAFT Left 05/11/2021   Procedure: LEFT FEMORAL-BELOW KNEE POPLITEAL ARTERY BYPASS GRAFT WITH 6 mm PTFE and COMPLETION ANGIOGRAM;  Surgeon: [name], MD;  Location: [place];  Service: Vascular;  Laterality: Left;   GASTRIC PULL THROUGH  2010   With esophagectomy   INSERTION OF ILIAC STENT Bilateral 04/13/2021   Procedure: INSERTION OF BILATERAL COMMON ILIAC  KISSING STENTS AND INSERION OF RIGHT EXTERNAL ILIAC STENT;  Surgeon: [name], MD;  Location[facility];  Service: Vascular;  Laterality: Bilateral;   JOINT REPLACEMENT     LIPOMA EXCISION  2010   LOWER EXTREMITY ANGIOGRAM Left 04/13/2021   Procedure: LEFT LOWER EXTREMITY ANGIOGRAM ;  Surgeon: [name], MD;  Location: [place];  Service: Vascular;  Laterality: Left;   PERIPHERAL VASCULAR INTERVENTION  04/01/2021   Procedure: PERIPHERAL VASCULAR INTERVENTION;  Surgeon: [name], MD;  Location: [place] [facility] LAB;  Service: Cardiovascular;;   POLYPECTOMY  10/24/2017   Procedure: POLYPECTOMY;  Surgeon: [name], MD;  [facility];  Service: Endoscopy;;  colon    TOTAL HIP ARTHROPLASTY Left 01/30/2019   Procedure: TOTAL HIP ARTHROPLASTY ANTERIOR APPROACH;  Surgeon: Renette Butters, MD;  Location: [facility];  Service: Orthopedics;  Laterality: Left;   ULTRASOUND GUIDANCE FOR VASCULAR ACCESS Left 04/13/2021   Procedure: ULTRASOUND GUIDANCE FOR VASCULAR ACCESS, left femoral;  Surgeon: [name], MD;  Location[facility] OR;  Service: Vascular;  Laterality: Left;   Family History  Problem Relation Age of Onset   GER disease Mother    Coronary artery disease Brother    Congenital heart disease Sister    Colon cancer Neg Hx    Social History   Tobacco Use   Smoking status: Former    Packs/day: 2.00    Years: 40.00    Pack years: 80.00    Types: Cigarettes    Start date: 50    Quit date: 07/20/1995    Years since quitting: 26.1   Smokeless tobacco: Never  Vaping Use    Vaping Use: Never used  Substance Use Topics   Alcohol use: No    Alcohol/week: 0.0 standard drinks   Drug use: No    ROS:  General: Negative for anorexia, weight loss, fever, chills, fatigue, weakness. ENT: Negative for hoarseness, difficulty swallowing , nasal congestion. CV: Negative for chest pain, angina, palpitations, dyspnea on exertion, peripheral edema.  Respiratory: Negative for dyspnea at rest, positive dyspnea on exertion, cough, sputum, wheezing.  GI: See history of present illness. GU:  Negative for dysuria, hematuria, urinary incontinence, urinary frequency, nocturnal urination.  Endo: Negative for unusual weight change.    Physical Examination:   BP 122/68    Pulse 69    Temp (!) 97.3 F (36.3 C) (Temporal)    Ht 5\' 8"  (1.727 m)    Wt 151 lb 9.6 oz (68.8 kg)    BMI 23.05 kg/m   General: Well-nourished, well-developed in no acute distress.  Eyes: No icterus. Mouth: masked. Lungs: Clear to auscultation bilaterally.  Heart: Regular rate and rhythm, no murmurs rubs or gallops.  Abdomen: Bowel sounds are normal, nontender, nondistended, no hepatosplenomegaly or masses, no abdominal bruits or hernia , no rebound or guarding.   Extremities: No lower extremity edema. No clubbing or deformities. Neuro: Alert and oriented x 4   Skin: Warm and dry, no jaundice.   Psych: Alert and cooperative, normal mood and affect.  Labs:  Lab Results  Component Value Date   CREATININE 0.69 08/15/2021   BUN 16 08/15/2021   NA 137 08/15/2021   K 3.8 08/15/2021   CL 109 08/15/2021   CO2 23 08/15/2021   Lab Results  Component Value Date   ALT 15 05/11/2021   AST 19 05/11/2021   ALKPHOS 79 05/11/2021   BILITOT 0.5 05/11/2021    Lab Results  Component  Value Date   WBC 5.7 08/15/2021   HGB 11.3 (L) 08/15/2021   HCT 36.8 (L) 08/15/2021   MCV 81.4 08/15/2021   PLT 336 08/15/2021    No results found for: IRON, TIBC, FERRITIN No results found for: VITAMINB12 No results  found for: FOLATE   Imaging Studies: DG Chest 2 View  Result Date: 08/16/2021 CLINICAL DATA:  Wheezing with shortness of breath. EXAM: CHEST - 2 VIEW COMPARISON:  Chest x-ray 01/01/2021. FINDINGS: Cardiomediastinal silhouette is within normal limits. Patient is status post cardiac surgery. There is no lung consolidation, pleural effusion or pneumothorax. The lungs are hyperinflated compatible with emphysema, unchanged. No acute fractures are seen. IMPRESSION: 1. No acute cardiopulmonary process. 2. Emphysema. Electronically Signed   By: Ronney Asters M.D.   On: 08/16/2021 19:55   CT ANGIO CHEST PE W OR WO CONTRAST  Result Date: 08/15/2021 CLINICAL DATA:  Chest pain.  Elevated D-dimer. EXAM: CT ANGIOGRAPHY CHEST WITH CONTRAST TECHNIQUE: Multidetector CT imaging of the chest was performed using the standard protocol during bolus administration of intravenous contrast. Multiplanar CT image reconstructions and MIPs were obtained to evaluate the vascular anatomy. RADIATION DOSE REDUCTION: This exam was performed according to the departmental dose-optimization program which includes automated exposure control, adjustment of the mA and/or kV according to patient size and/or use of iterative reconstruction technique. CONTRAST:  106mL OMNIPAQUE IOHEXOL 350 MG/ML SOLN COMPARISON:  January 01, 2021. FINDINGS: Cardiovascular: Satisfactory opacification of the pulmonary arteries to the segmental level. No evidence of pulmonary embolism. Normal heart size. No pericardial effusion. Status post coronary bypass graft. Atherosclerosis of thoracic aorta is noted without aneurysm or dissection. Mediastinum/Nodes: Status post esophagectomy with gastric pull-through procedure. No adenopathy is noted. Thyroid gland is unremarkable. Lungs/Pleura: No pneumothorax or pleural effusion is noted. Emphysematous disease is noted throughout both lungs. Stable 8 mm nodule is noted in right lower lobe which be considered benign at this point.  Upper Abdomen: Small gallstone is noted. Musculoskeletal: No chest wall abnormality. No acute or significant osseous findings. Review of the MIP images confirms the above findings. IMPRESSION: No definite evidence of pulmonary embolus. Status post esophagectomy with gastric pull-through procedure. Stable right lower lobe 8 mm nodule is noted which can be considered benign at this point. Small solitary gallstone is noted. Aortic Atherosclerosis (ICD10-I70.0) and Emphysema (ICD10-J43.9). Electronically Signed   By: Marijo Conception M.D.   On: 08/15/2021 10:16     Assessment: Pleasant 76 y/o male with history of esophageal cancer s/p esophagectomy with gastric pull-through in 2010, Barrett's esophageal diagnosed in 2019 with no dysplasia, h/o adenomatous colon polyps returning to schedule EGD/colonoscopy.  Procedure was canceled a couple times last year due to other medical issues as outlined above.  Barrett's esophagus: History of remote esophageal cancer in 2010, EGD in 2019 with new Barrett's.  Due for surveillance endoscopy.   GERD: No heartburn.  Chronically has nocturnal regurgitation since his esophagectomy with gastric pull-up in 2010.  He sleeps with head of bed elevated.  Waiting 5 to 6 hours before laying down after meals.  Denies daytime symptoms.  Unfortunately he came off Wil[place] for a while due to expense, using only when he felt like he needed it.  With recent ED visit, symptoms, he started Dexilant daily over the past 2 weeks.  Also with new inhaler.  Reports feeling better.  Need to find PPI that is effective for him and affordable.  Requesting Carafate tablets to have on hand.  History of adenomatous colon  polyps: Due for surveillance exam last year.  Patient is 76 years old and wonders if he should consider an additional colonoscopy at this point.  Based on current guidelines, would be reasonable to stop surveillance exams.  He would like to have Dr. Roseanne Kaufman opinion and states he will do  what ever he recommends.  We will discuss when he returns.  Constipation: Doing well with MiraLAX daily as needed.  Encourage patient to begin MiraLAX if he has no bowel movement for 24 to 48 hours.  Plan: We discussed possible colonoscopy with Dr. [name] when he returns.  If we pursue colonoscopy, patient request small volume prep due to his prior surgery.   We will plan for EGD with Dr. [name] upon his return.  Possible colonoscopy if advised.  Recommend propofol, ASA 3.   Stop Dexilant due to expense.  Start lansoprazole 30 mg before evening meal daily.   MiraLAX daily as needed.

## 2021-09-11 ENCOUNTER — Encounter (HOSPITAL_COMMUNITY): Payer: Medicare Other

## 2021-09-11 ENCOUNTER — Ambulatory Visit: Payer: Medicare Other | Admitting: Vascular Surgery

## 2021-09-12 ENCOUNTER — Other Ambulatory Visit: Payer: Self-pay

## 2021-09-12 DIAGNOSIS — I739 Peripheral vascular disease, unspecified: Secondary | ICD-10-CM

## 2021-09-18 ENCOUNTER — Ambulatory Visit: Payer: Medicare Other | Admitting: Vascular Surgery

## 2021-09-18 ENCOUNTER — Encounter (HOSPITAL_COMMUNITY): Payer: Medicare Other

## 2021-09-24 ENCOUNTER — Telehealth: Payer: Self-pay | Admitting: *Deleted

## 2021-09-24 MED ORDER — CLENPIQ 10-3.5-12 MG-GM -GM/160ML PO SOLN: 1.0000 | Freq: Once | ORAL | 0 refills | Status: AC

## 2021-09-24 NOTE — Telephone Encounter (Signed)
Thank you. Please let patient know that Dr. [name] is advises both and EGD and one last colonoscopy.  ? ?If agreeable, please schedule EGD/TCS with Rourk with propofol, ASA 3.  ?Dx: Barrett's, adenomatous colon polyps ? ?Patient should be using miralax one capful daily to maintain regular BMs. ?He will nee small volume prep due to previous esophagectomy.  ? ?

## 2021-09-24 NOTE — Telephone Encounter (Signed)
Called pt. He was agreeable to both procedures. Made aware of miralax recs. Patient scheduled for 4/6 at 11am. Aware needs pre-op prior. Instructions will be mailed. Rx sent to pharmacy. ?

## 2021-09-24 NOTE — Telephone Encounter (Signed)
PA approved via [facility]. Auth# [id number], DOS: Oct 22, 2021 - Jan 20, 2022 ?

## 2021-09-24 NOTE — Telephone Encounter (Signed)
Per OV with Magda Paganini "We will be in touch once Dr. [name] returns to schedule upper endoscopy.  I will discuss with him if he advises you to pursue another colonoscopy and we will let you know.  Anticipate further information later next month." ? ?Fowarding to [place] as we are working on Dr. Roseanne Kaufman April schedule ?

## 2021-09-24 NOTE — Addendum Note (Signed)
Addended by: Cheron Every on: 09/24/2021 03:46 PM ? ? Modules accepted: Orders ? ?

## 2021-09-25 ENCOUNTER — Encounter: Payer: Self-pay | Admitting: *Deleted

## 2021-10-09 ENCOUNTER — Other Ambulatory Visit: Payer: Self-pay

## 2021-10-09 ENCOUNTER — Ambulatory Visit (INDEPENDENT_AMBULATORY_CARE_PROVIDER_SITE_OTHER)
Admission: RE | Admit: 2021-10-09 | Discharge: 2021-10-09 | Disposition: A | Discharge status: Home / Self Care | Payer: Medicare Other | Source: Ambulatory Visit | Attending: Vascular Surgery | Admitting: Vascular Surgery

## 2021-10-09 ENCOUNTER — Ambulatory Visit (HOSPITAL_COMMUNITY)
Admission: RE | Admit: 2021-10-09 | Discharge: 2021-10-09 | Disposition: A | Discharge status: Home / Self Care | Payer: Medicare Other | Source: Ambulatory Visit | Attending: Vascular Surgery | Admitting: Vascular Surgery

## 2021-10-09 ENCOUNTER — Ambulatory Visit: Payer: Medicare Other | Admitting: Vascular Surgery

## 2021-10-09 DIAGNOSIS — I739 Peripheral vascular disease, unspecified: Secondary | ICD-10-CM | POA: Diagnosis present

## 2021-10-14 ENCOUNTER — Other Ambulatory Visit: Payer: Self-pay

## 2021-10-14 ENCOUNTER — Ambulatory Visit: Payer: Medicare Other | Admitting: Pulmonary Disease

## 2021-10-14 ENCOUNTER — Encounter: Payer: Self-pay | Admitting: Pulmonary Disease

## 2021-10-14 ENCOUNTER — Ambulatory Visit (INDEPENDENT_AMBULATORY_CARE_PROVIDER_SITE_OTHER): Payer: Medicare Other | Admitting: Pulmonary Disease

## 2021-10-14 VITALS — BP 136/60 | HR 70 | Ht 68.75 in | Wt 156.0 lb

## 2021-10-14 DIAGNOSIS — J439 Emphysema, unspecified: Secondary | ICD-10-CM

## 2021-10-14 DIAGNOSIS — J449 Chronic obstructive pulmonary disease, unspecified: Secondary | ICD-10-CM

## 2021-10-14 LAB — PULMONARY FUNCTION TEST
DL/VA % pred: 52 %
DL/VA: 2.09 ml/min/mmHg/L
DLCO cor % pred: 50 %
DLCO cor: 12.21 ml/min/mmHg
DLCO unc % pred: 50 %
DLCO unc: 12.21 ml/min/mmHg
FEF 25-75 Post: 0.92 L/sec
FEF 25-75 Pre: 0.71 L/sec
FEF2575-%Change-Post: 28 %
FEF2575-%Pred-Post: 44 %
FEF2575-%Pred-Pre: 34 %
FEV1-%Change-Post: 11 %
FEV1-%Pred-Post: 64 %
FEV1-%Pred-Pre: 58 %
FEV1-Post: 1.86 L
FEV1-Pre: 1.67 L
FEV1FVC-%Change-Post: 1 %
FEV1FVC-%Pred-Pre: 71 %
FEV6-%Change-Post: 7 %
FEV6-%Pred-Post: 91 %
FEV6-%Pred-Pre: 85 %
FEV6-Post: 3.43 L
FEV6-Pre: 3.2 L
FEV6FVC-%Change-Post: -2 %
FEV6FVC-%Pred-Post: 104 %
FEV6FVC-%Pred-Pre: 106 %
FVC-%Change-Post: 9 %
FVC-%Pred-Post: 87 %
FVC-%Pred-Pre: 80 %
FVC-Post: 3.5 L
FVC-Pre: 3.2 L
Post FEV1/FVC ratio: 53 %
Post FEV6/FVC ratio: 98 %
Pre FEV1/FVC ratio: 52 %
Pre FEV6/FVC Ratio: 100 %
RV % pred: 137 %
RV: 3.45 L
TLC % pred: 109 %
TLC: 7.45 L

## 2021-10-14 NOTE — Patient Instructions (Signed)
Moderately severe COPD (FEV1 59%): GOLD Class B, mMRC <2 ?--CONTINUE Anoro ONE puff ONCE a day ?--CONTINUE montelukast 10 mg daily.  ?--CONTINUE Albuterol as needed for shortness of breath or wheezing.  ? ?Follow-up with me in 5 months. ?

## 2021-10-14 NOTE — Progress Notes (Signed)
? ? ?Subjective:  ? ?PATIENT ID: Gregory Eaton GENDER: male DOB: [birth year], MRN: [id number] ? ? ?HPI ? ?Chief Complaint  ?Patient presents with  ? Follow-up  ?  pft  ? ? ?Reason for Visit: Follow-up  ? ?Gregory Eaton is a 76 year old male former smoker with COPD (FEV1 59%), status post CABG, hypertension, PVD and history of esophageal cancer status post esophagectomy and gastric pull-through in 2010 who presents for follow-up. ? ?11/05/20 ?He reports his COPD is very well controlled. Denies any recent exacerbations requiring steroids or antibiotics. No ED/urgent care visit. Compliant with his Bevespi. He has used his albuterol twice since our last visit six months ago. He still has shortness of breath when walking uphill but feels that his legs have decreased circulation that may be contributing. Denies cough and wheezing. ? ?09/03/21 ?Since our last visit, he reports his COPD has worsened but unsure if this is related to his reflux or heart. Denies coughing or wheezing. Has chest tightness. He is able to work in the yard and lift heavy loads without issue. No limitations in activity. He has been trying to see his Cardiologist. He has not been taking Bevespi that will cause chest discomfort so he is only taking it 1-2 days a week. Has not used his rescue inhaler. ? ?He underwent a left femoral to below-knee popliteal arterial bypass on 05/11/2021. No issues except for needing a course of antibiotics post-procedure for soft tissue infection. More recently, He was seen in the ED on 08/07/2021.  Notes were reviewed.  He presented with chest pain and shortness of breath for 1 week.  D-dimer was elevated so was sent to the ED for further work-up.  CTA was negative for PE ? ?10/14/21 ?Since our last visit, he failed Bevespi and was started on Anoro. Tolerating inhalers. Able to perform yardwork without limitation. He is walking 3-4 blocks daily but notices substernal chest pain with moderate exertion. He is scheduled to  see Cardiology on 10/23/21 as he is concerned about heart blockage. Denies shortness of breath, cough or wheezing. ? ?Social History: ?60-pack-year smoking history. Quit in 1997 ?Previous employed as a Administrator, previously did Dealer work ? ?Environmental exposures: Hx of construction x 2 years ? ?Past Medical History:  ?Diagnosis Date  ? Adenomatous colon polyp 03/2009  ? Last colonoscopy by Dr. [name]   ? Adenomatous polyp 2010  ? Adenomatous polyp of colon 11/03/2010  ? Arthritis   ? Barrett's esophagus   ? CAD (coronary artery disease)   ? Complication of anesthesia   ? has a shortened esophogus due to CA.   ? COPD (chronic obstructive pulmonary disease) ([place])   ? Severe emphysema per CT  ? Diverticulosis   ? Emphysema of lung ([place])   ? Esophageal carcinoma ([place]) 03/2009  ? T1N1M0  ? GERD (gastroesophageal reflux disease)   ? Glaucoma   ? History of Doppler ultrasound 11/09/2011  ? 03/2014- 50-69% L ICA stenosis;carotid doppler; L bulb/prox ICA 0-49% diameter reduction; L vertebral artery - occlusive ds; L ECA  demonstrates severe amount of fibrous plaque  ? History of Doppler ultrasound 11/09/2011  ? LEAs; R ABI - mod art. insuff.; L ABI normal at rest; R SFA - occlusive ds; L SFA - occlusive ds; patent fem-pop graft  ? History of echocardiogram 08/27/2009  ? EF >55%  ? History of hiatal hernia   ? History of kidney stones 1965  ? History of nuclear stress test 11/24/2011  ? lexiscan;  normal perfusion; low risk scan; non-diagnostic for ischemia  ? Hyperlipidemia   ? Hypertension   ? Left carotid artery stenosis 04/08/2014  ? Pneumonia   ? Pre-diabetes   ? Pulmonary nodule, right 04/08/2014  ? 2.8 mm-incidental finding on CT  ? PVD (peripheral vascular disease) ([place])   ? [place]  ? Status post ablation at [facility]  ? Tobacco abuse   ?  ?Outpatient Medications Prior to Visit  ?Medication Sig Dispense Refill  ? acetaminophen (TYLENOL) 500 MG tablet Take 1,000 mg by mouth daily as needed for headache.     ? ALPRAZolam (XANAX) 0.5 MG tablet Take 1 tablet (0.5 mg total) by mouth at bedtime. 30 tablet 0  ? amLODipine (NORVASC) 10 MG tablet TAKE ONE (1) TABLET BY MOUTH EVERY DAY 90 tablet 3  ? aspirin EC 81 MG tablet Take 1 tablet (81 mg total) by mouth daily.    ? clopidogrel (PLAVIX) 75 MG tablet TAKE ONE (1) TABLET BY MOUTH EVERY DAY 90 tablet 0  ? lansoprazole (PREVACID) 30 MG capsule Take 1 capsule (30 mg total) by mouth daily before supper. 90 capsule 3  ? latanoprost (XALATAN) 0.005 % ophthalmic solution Place 1 drop into both eyes at bedtime.     ? losartan (COZAAR) 100 MG tablet TAKE ONE TABLET BY MOUTH EVERY NIGHT AT BEDTIME 90 tablet 3  ? metoprolol succinate (TOPROL-XL) 50 MG 24 hr tablet TAKE ONE (1) TABLET BY MOUTH EVERY DAY 90 tablet 1  ? montelukast (SINGULAIR) 10 MG tablet Take 1 tablet (10 mg total) by mouth daily. 90 tablet 4  ? Multiple Vitamins-Minerals (CENTRUM SILVER PO) Take 1 tablet by mouth daily.    ? nitroGLYCERIN (NITROSTAT) 0.4 MG SL tablet Place 1 tablet (0.4 mg total) under the tongue every 5 (five) minutes as needed for chest pain. 25 tablet 0  ? rosuvastatin (CRESTOR) 20 MG tablet Take 1 tablet (20 mg total) by mouth daily. 30 tablet 3  ? sucralfate (CARAFATE) 1 g tablet Take 1 tablet (1 g total) by mouth 4 (four) times daily -  with meals and at bedtime. As needed for esophageal pain, heartburn. You can crush in couple of tablespoons of water to make suspension. 30 tablet 0  ? umeclidinium-vilanterol (ANORO ELLIPTA) 62.5-25 MCG/ACT AEPB Inhale 1 puff into the lungs daily. 60 each 5  ? albuterol (PROVENTIL) (2.5 MG/3ML) 0.083% nebulizer solution Take 3 mLs (2.5 mg total) by nebulization every 6 (six) hours as needed for wheezing or shortness of breath. DX: J44.9 (Patient not taking: Reported on 10/14/2021) 75 mL 6  ? baclofen (LIORESAL) 10 MG tablet Take 1 tablet (10 mg total) by mouth 3 (three) times daily as needed for muscle spasms. (Patient not taking: Reported on 10/14/2021) 20  each 0  ? guaiFENesin (MUCINEX) 600 MG 12 hr tablet Take 600 mg by mouth every 12 (twelve) hours as needed (cough). (Patient not taking: Reported on 10/14/2021)    ? ?No facility-administered medications prior to visit.  ? ? ?Review of Systems  ?Constitutional:  Negative for chills, diaphoresis, fever, malaise/fatigue and weight loss.  ?HENT:  Negative for congestion.   ?Respiratory:  Negative for cough, hemoptysis, sputum production, shortness of breath and wheezing.   ?Cardiovascular:  Positive for chest pain. Negative for palpitations and leg swelling.  ? ? ?Objective:  ? ?Vitals:  ? 10/14/21 1321  ?BP: 136/60  ?Pulse: 70  ?SpO2: 96%  ?Weight: 156 lb (70.8 kg)  ?Height: 5' 8.75" (1.746 m)  ?  SpO2: 96 % ?O2 Device: None (Room air) ? ?Physical Exam: ?General: Well-appearing, no acute distress ?HENT: [place], AT ?Eyes: EOMI, no scleral icterus ?Respiratory: Clear to auscultation bilaterally.  No crackles, wheezing or rales ?Cardiovascular: RRR, -M/R/G, no JVD ?Extremities:-Edema,-tenderness ?Neuro: AAO x4, CNII-XII grossly intact ?Psych: Normal mood, normal affect ? ?Data Reviewed: ? ?Imaging: ?CT Chest 11/22/17 - Centrilobular and paraseptal emphysema. 16m nodule in the RLL, unchanged since 2015 ?CTA 08/15/2021-no pulmonary embolism.  Stable right lower lobe 8 mm nodule.  Emphysema. ? ?PFT: ?03/23/2016 ?FVC 4.1 L (101 %) FEV1 1.8 (59 %) Ratio 58  ?Interpretation: Moderately severe obstructive defect.  Normal vital capacity. ? ?10/14/21 ?FVC 3.50 (87%) FEV1 1.86 (64%) Ratio 52  TLC 109% RV 137% DLCO 50%. Borderline but not significant bronchodilator response ?Interpretation: Moderate obstructive lung defect with air trapping and moderately reduced gas exchange consistent with emphysema. Improved FEV1 with bronchodilators ? ?  ? ?Assessment & Plan:  ? ?Discussion: ?76year old male with moderately severe COPD, hx of esophageal CA s/p esophagectomy, hx CABG and PVD on DAPT who presents for follow-up. Improved symptoms on Anoro.  Continues to have symptoms with moderate exertion but limited mainly due to chest pain. PFTs with stable function and improved to moderate severity with bronchodilators. No change in current therapy. Agree

## 2021-10-19 ENCOUNTER — Other Ambulatory Visit: Payer: Self-pay

## 2021-10-19 DIAGNOSIS — T82898A Other specified complication of vascular prosthetic devices, implants and grafts, initial encounter: Secondary | ICD-10-CM

## 2021-10-19 NOTE — Patient Instructions (Signed)
? ? ? ? ? ? ? ? Gregory Eaton ? 10/19/2021  ?  ? '@PREFPERIOPPHARMACY'$ @ ? ? Your procedure is scheduled on  10/22/2021. ? ? Report to [facility] at  [facility]. ? ? Call this number if you have problems the morning of surgery: ? [phone] ? ? Remember: ? Follow the diet and prep instructions given to you by the office. ?  ? ?Use your nebulizer and your inhaler before you come and bring your rescue inhaler with you. ? ? ? Take these medicines the morning of surgery with A SIP OF WATER  ? ?amlodipine, baclofen, gabapentin, prevacid, metoprolol, singulair. ?  ? Do not wear jewelry, make-up or nail polish. ? Do not wear lotions, powders, or perfumes, or deodorant. ? Do not shave 48 hours prior to surgery.  Men may shave face and neck. ? Do not bring valuables to the hospital. ? [facility] is not responsible for any belongings or valuables. ? ?Contacts, dentures or bridgework may not be worn into surgery.  Leave your suitcase in the car.  After surgery it may be brought to your room. ? ?For patients admitted to the hospital, discharge time will be determined by your treatment team. ? ?Patients discharged the day of surgery will not be allowed to drive home and must have someone with them for 24 hours.  ? ? ?Special instructions:   DO NOT smoke tobacco or vape for 24 hours before your procedure. ? ?Please read over the following fact sheets that you were given. ?Anesthesia Post-op Instructions and Care and Recovery After Surgery ?  ? ? ? Upper Endoscopy, Adult, Care After ?This sheet gives you information about how to care for yourself after your procedure. Your health care provider may also give you more specific instructions. If you have problems or questions, contact your health care provider. ?What can I expect after the procedure? ?After the procedure, it is common to have: ?A sore throat. ?Mild stomach pain or discomfort. ?Bloating. ?Nausea. ?Follow these instructions at home: ? ?Follow instructions from your health  care provider about what to eat or drink after your procedure. ?Return to your normal activities as told by your health care provider. Ask your health care provider what activities are safe for you. ?Take over-the-counter and prescription medicines only as told by your health care provider. ?If you were given a sedative during the procedure, it can affect you for several hours. Do not drive or operate machinery until your health care provider says that it is safe. ?Keep all follow-up visits as told by your health care provider. This is important. ?Contact a health care provider if you have: ?A sore throat that lasts longer than one day. ?Trouble swallowing. ?Get help right away if: ?You vomit blood or your vomit looks like coffee grounds. ?You have: ?A fever. ?Bloody, black, or tarry stools. ?A severe sore throat or you cannot swallow. ?Difficulty breathing. ?Severe pain in your chest or abdomen. ?Summary ?After the procedure, it is common to have a sore throat, mild stomach discomfort, bloating, and nausea. ?If you were given a sedative during the procedure, it can affect you for several hours. Do not drive or operate machinery until your health care provider says that it is safe. ?Follow instructions from your health care provider about what to eat or drink after your procedure. ?Return to your normal activities as told by your health care provider. ?This information is not intended to replace advice given to you by  your health care provider. Make sure you discuss any questions you have with your health care provider. ?Document Revised: 05/11/2019 Document Reviewed: 12/05/2017 ?Elsevier Patient Education ? [place]. ?Colonoscopy, Adult, Care After ?This sheet gives you information about how to care for yourself after your procedure. Your health care provider may also give you more specific instructions. If you have problems or questions, contact your health care provider. ?What can I expect after the  procedure? ?After the procedure, it is common to have: ?A small amount of blood in your stool for 24 hours after the procedure. ?Some gas. ?Mild cramping or bloating of your abdomen. ?Follow these instructions at home: ?Eating and drinking ? ?Drink enough fluid to keep your urine pale yellow. ?Follow instructions from your health care provider about eating or drinking restrictions. ?Resume your normal diet as instructed by your health care provider. Avoid heavy or fried foods that are hard to digest. ?Activity ?Rest as told by your health care provider. ?Avoid sitting for a long time without moving. Get up to take short walks every 1-2 hours. This is important to improve blood flow and breathing. Ask for help if you feel weak or unsteady. ?Return to your normal activities as told by your health care provider. Ask your health care provider what activities are safe for you. ?Managing cramping and bloating ? ?Try walking around when you have cramps or feel bloated. ?Apply heat to your abdomen as told by your health care provider. Use the heat source that your health care provider recommends, such as a moist heat pack or a heating pad. ?Place a towel between your skin and the heat source. ?Leave the heat on for 20-30 minutes. ?Remove the heat if your skin turns bright red. This is especially important if you are unable to feel pain, heat, or cold. You may have a greater risk of getting burned. ?General instructions ?If you were given a sedative during the procedure, it can affect you for several hours. Do not drive or operate machinery until your health care provider says that it is safe. ?For the first 24 hours after the procedure: ?Do not sign important documents. ?Do not drink alcohol. ?Do your regular daily activities at a slower pace than normal. ?Eat soft foods that are easy to digest. ?Take over-the-counter and prescription medicines only as told by your health care provider. ?Keep all follow-up visits as told by  your health care provider. This is important. ?Contact a health care provider if: ?You have blood in your stool 2-3 days after the procedure. ?Get help right away if you have: ?More than a small spotting of blood in your stool. ?Large blood clots in your stool. ?Swelling of your abdomen. ?Nausea or vomiting. ?A fever. ?Increasing pain in your abdomen that is not relieved with medicine. ?Summary ?After the procedure, it is common to have a small amount of blood in your stool. You may also have mild cramping and bloating of your abdomen. ?If you were given a sedative during the procedure, it can affect you for several hours. Do not drive or operate machinery until your health care provider says that it is safe. ?Get help right away if you have a lot of blood in your stool, nausea or vomiting, a fever, or increased pain in your abdomen. ?This information is not intended to replace advice given to you by your health care provider. Make sure you discuss any questions you have with your health care provider. ?Document Revised: 05/11/2019 Document Reviewed:  01/29/2019 ?Elsevier Patient Education ? [place]. ?Monitored Anesthesia Care, Care After ?This sheet gives you information about how to care for yourself after your procedure. Your health care provider may also give you more specific instructions. If you have problems or questions, contact your health care provider. ?What can I expect after the procedure? ?After the procedure, it is common to have: ?Tiredness. ?Forgetfulness about what happened after the procedure. ?Impaired judgment for important decisions. ?Nausea or vomiting. ?Some difficulty with balance. ?Follow these instructions at home: ?For the time period you were told by your health care provider: ?  ?Rest as needed. ?Do not participate in activities where you could fall or become injured. ?Do not drive or use machinery. ?Do not drink alcohol. ?Do not take sleeping pills or medicines that cause  drowsiness. ?Do not make important decisions or sign legal documents. ?Do not take care of children on your own. ?Eating and drinking ?Follow the diet that is recommended by your health care provider. ?Drink

## 2021-10-20 ENCOUNTER — Other Ambulatory Visit: Payer: Self-pay

## 2021-10-20 ENCOUNTER — Encounter (HOSPITAL_COMMUNITY): Payer: Self-pay

## 2021-10-20 ENCOUNTER — Encounter (HOSPITAL_COMMUNITY)
Admission: RE | Admit: 2021-10-20 | Discharge: 2021-10-20 | Disposition: A | Discharge status: Home / Self Care | Payer: Medicare Other | Source: Ambulatory Visit | Attending: Internal Medicine | Admitting: Internal Medicine

## 2021-10-22 ENCOUNTER — Ambulatory Visit (HOSPITAL_BASED_OUTPATIENT_CLINIC_OR_DEPARTMENT_OTHER): Payer: Medicare Other | Admitting: Anesthesiology

## 2021-10-22 ENCOUNTER — Other Ambulatory Visit: Payer: Self-pay

## 2021-10-22 ENCOUNTER — Ambulatory Visit (HOSPITAL_COMMUNITY): Payer: Medicare Other | Admitting: Anesthesiology

## 2021-10-22 ENCOUNTER — Encounter (HOSPITAL_COMMUNITY)
Admission: RE | Disposition: A | Discharge status: Home / Self Care | Payer: Self-pay | Source: Home / Self Care | Attending: Internal Medicine

## 2021-10-22 ENCOUNTER — Encounter (HOSPITAL_COMMUNITY): Payer: Self-pay | Admitting: Internal Medicine

## 2021-10-22 ENCOUNTER — Ambulatory Visit (HOSPITAL_COMMUNITY)
Admission: RE | Admit: 2021-10-22 | Discharge: 2021-10-22 | Disposition: A | Discharge status: Home / Self Care | Payer: Medicare Other | Attending: Internal Medicine | Admitting: Internal Medicine

## 2021-10-22 DIAGNOSIS — E119 Type 2 diabetes mellitus without complications: Secondary | ICD-10-CM | POA: Diagnosis not present

## 2021-10-22 DIAGNOSIS — Z9049 Acquired absence of other specified parts of digestive tract: Secondary | ICD-10-CM | POA: Diagnosis not present

## 2021-10-22 DIAGNOSIS — Z8501 Personal history of malignant neoplasm of esophagus: Secondary | ICD-10-CM | POA: Diagnosis not present

## 2021-10-22 DIAGNOSIS — I10 Essential (primary) hypertension: Secondary | ICD-10-CM | POA: Insufficient documentation

## 2021-10-22 DIAGNOSIS — Z1211 Encounter for screening for malignant neoplasm of colon: Secondary | ICD-10-CM | POA: Diagnosis not present

## 2021-10-22 DIAGNOSIS — Z951 Presence of aortocoronary bypass graft: Secondary | ICD-10-CM | POA: Diagnosis not present

## 2021-10-22 DIAGNOSIS — Z13810 Encounter for screening for upper gastrointestinal disorder: Secondary | ICD-10-CM

## 2021-10-22 DIAGNOSIS — Z86010 Personal history of colonic polyps: Secondary | ICD-10-CM | POA: Diagnosis not present

## 2021-10-22 DIAGNOSIS — Z8719 Personal history of other diseases of the digestive system: Secondary | ICD-10-CM | POA: Insufficient documentation

## 2021-10-22 DIAGNOSIS — K219 Gastro-esophageal reflux disease without esophagitis: Secondary | ICD-10-CM | POA: Insufficient documentation

## 2021-10-22 DIAGNOSIS — I739 Peripheral vascular disease, unspecified: Secondary | ICD-10-CM | POA: Insufficient documentation

## 2021-10-22 DIAGNOSIS — Z7984 Long term (current) use of oral hypoglycemic drugs: Secondary | ICD-10-CM | POA: Diagnosis not present

## 2021-10-22 DIAGNOSIS — K2950 Unspecified chronic gastritis without bleeding: Secondary | ICD-10-CM | POA: Insufficient documentation

## 2021-10-22 DIAGNOSIS — K2270 Barrett's esophagus without dysplasia: Secondary | ICD-10-CM | POA: Insufficient documentation

## 2021-10-22 DIAGNOSIS — K635 Polyp of colon: Secondary | ICD-10-CM

## 2021-10-22 DIAGNOSIS — Z7902 Long term (current) use of antithrombotics/antiplatelets: Secondary | ICD-10-CM | POA: Diagnosis not present

## 2021-10-22 DIAGNOSIS — K5730 Diverticulosis of large intestine without perforation or abscess without bleeding: Secondary | ICD-10-CM

## 2021-10-22 DIAGNOSIS — Z980 Intestinal bypass and anastomosis status: Secondary | ICD-10-CM | POA: Diagnosis not present

## 2021-10-22 DIAGNOSIS — Z87891 Personal history of nicotine dependence: Secondary | ICD-10-CM | POA: Diagnosis not present

## 2021-10-22 DIAGNOSIS — I2510 Atherosclerotic heart disease of native coronary artery without angina pectoris: Secondary | ICD-10-CM | POA: Diagnosis not present

## 2021-10-22 DIAGNOSIS — D123 Benign neoplasm of transverse colon: Secondary | ICD-10-CM | POA: Diagnosis not present

## 2021-10-22 DIAGNOSIS — K9189 Other postprocedural complications and disorders of digestive system: Secondary | ICD-10-CM

## 2021-10-22 DIAGNOSIS — J449 Chronic obstructive pulmonary disease, unspecified: Secondary | ICD-10-CM | POA: Insufficient documentation

## 2021-10-22 HISTORY — PX: POLYPECTOMY: SHX5525

## 2021-10-22 HISTORY — PX: ESOPHAGOGASTRODUODENOSCOPY (EGD) WITH PROPOFOL: SHX5813

## 2021-10-22 HISTORY — PX: COLONOSCOPY WITH PROPOFOL: SHX5780

## 2021-10-22 HISTORY — PX: BIOPSY: SHX5522

## 2021-10-22 LAB — GLUCOSE, CAPILLARY: Glucose-Capillary: 90 mg/dL (ref 70–99)

## 2021-10-22 SURGERY — COLONOSCOPY WITH PROPOFOL
Anesthesia: General

## 2021-10-22 MED ORDER — LIDOCAINE HCL 1 % IJ SOLN
INTRAMUSCULAR | Status: DC | PRN
  Administered 2021-10-22: 50 mg via INTRADERMAL
  Administered 2021-10-22: 120 mg via INTRADERMAL
  Administered 2021-10-22: 150 mg via INTRADERMAL

## 2021-10-22 MED ORDER — PROPOFOL 500 MG/50ML IV EMUL
INTRAVENOUS | Status: DC | PRN
  Administered 2021-10-22: 150 ug/kg/min via INTRAVENOUS

## 2021-10-22 MED ORDER — PROPOFOL 10 MG/ML IV BOLUS
INTRAVENOUS | Status: DC | PRN
  Administered 2021-10-22: 30 mg via INTRAVENOUS
  Administered 2021-10-22: 50 mg via INTRAVENOUS

## 2021-10-22 MED ORDER — LACTATED RINGERS IV SOLN: INTRAVENOUS | Status: DC

## 2021-10-22 MED ORDER — STERILE WATER FOR IRRIGATION IR SOLN
Status: DC | PRN
  Administered 2021-10-22: .6 mL

## 2021-10-22 NOTE — Anesthesia Postprocedure Evaluation (Signed)
Anesthesia Post Note ? ?Patient: Gregory Eaton ? ?Procedure(s) Performed: COLONOSCOPY WITH PROPOFOL ?ESOPHAGOGASTRODUODENOSCOPY (EGD) WITH PROPOFOL ?BIOPSY ?POLYPECTOMY ? ?Patient location during evaluation: Short Stay ?Anesthesia Type: General ?Level of consciousness: awake and alert ?Pain management: pain level controlled ?Vital Signs Assessment: post-procedure vital signs reviewed and stable ?Respiratory status: spontaneous breathing ?Cardiovascular status: blood pressure returned to baseline ?Postop Assessment: no apparent nausea or vomiting ?Anesthetic complications: no ? ? ?No notable events documented. ? ? ?Last Vitals:  ?Vitals:  ? 10/22/21 0921 10/22/21 1117  ?BP: 129/67 (!) 83/40  ?Pulse: 73 72  ?Resp: 13 12  ?Temp: (!) 36.4 ?C 36.7 ?C  ?SpO2: 98% 95%  ?  ?Last Pain:  ?Vitals:  ? 10/22/21 1117  ?TempSrc: Oral  ?PainSc: 0-No pain  ? ? ?  ?  ?  ?  ?  ?  ? ?Chelsa Stout ? ? ? ? ?

## 2021-10-22 NOTE — Anesthesia Preprocedure Evaluation (Signed)
Anesthesia Evaluation  ?Patient identified by MRN, date of birth, ID band ?Patient awake ? ? ? ?Reviewed: ?Allergy & Precautions, NPO status , Patient's Chart, lab work & pertinent test results, reviewed documented beta blocker date and time  ? ?History of Anesthesia Complications ?(+) history of anesthetic complications ? ?Airway ?Mallampati: II ? ?TM Distance: >3 FB ?Neck ROM: Full ? ? ? Dental ? ?(+) Dental Advisory Given, Missing,  ?  ?Pulmonary ?pneumonia (aspiration), COPD,  COPD inhaler, former smoker,  ?  ?Pulmonary exam normal ?breath sounds clear to auscultation ? ? ? ? ? ? Cardiovascular ?hypertension, Pt. on medications and Pt. on home beta blockers ?+ CAD, + CABG and + Peripheral Vascular Disease (fem pop bypass)  ?Normal cardiovascular exam+ dysrhythmias  ?Rhythm:Regular Rate:Normal ? ? ?  ?Neuro/Psych ?negative neurological ROS ? negative psych ROS  ? GI/Hepatic ?Neg liver ROS, hiatal hernia, GERD (esophageal carcinoma)  Medicated,  ?Endo/Other  ?diabetes, Well Controlled, Type 2, Oral Hypoglycemic Agents ? Renal/GU ?negative Renal ROS  ?negative genitourinary ?  ?Musculoskeletal ? ?(+) Arthritis , Osteoarthritis,   ? Abdominal ?  ?Peds ?negative pediatric ROS ?(+)  Hematology ?negative hematology ROS ?(+)   ?Anesthesia Other Findings ? ? Reproductive/Obstetrics ?negative OB ROS ? ?  ? ? ? ? ? ? ? ? ? ? ? ? ? ?  ?  ? ? ? ? ? ? ? ?Anesthesia Physical ?Anesthesia Plan ? ?ASA: 3 ? ?Anesthesia Plan: General  ? ?Post-op Pain Management: Minimal or no pain anticipated  ? ?Induction: Intravenous ? ?PONV Risk Score and Plan: Propofol infusion ? ?Airway Management Planned: Natural Airway and Nasal Cannula ? ?Additional Equipment:  ? ?Intra-op Plan:  ? ?Post-operative Plan:  ? ?Informed Consent: I have reviewed the patients History and Physical, chart, labs and discussed the procedure including the risks, benefits and alternatives for the proposed anesthesia with the patient or  authorized representative who has indicated his/her understanding and acceptance.  ? ? ? ?Dental advisory given ? ?Plan Discussed with: CRNA and Surgeon ? ?Anesthesia Plan Comments: (Will keep the patient head up during the procedure. Reviewed previous EGD/colonoscopy under propofol anesthesia, No significant complications.)  ? ? ? ? ? ?Anesthesia Quick Evaluation ? ?

## 2021-10-22 NOTE — H&P (Signed)
$'@LOGO'w$ @ ? ? ?Primary Care Physician:  Curly Rim, MD ?Primary Gastroenterologist:  Dr. [name] ? ?Pre-Procedure History & Physical: ?HPI:  Gregory Eaton is a 76 y.o. male here for surveillance EGD and colonoscopy.  History of esophagectomy gastric pull-up for adenocarcinoma of the esophagus years ago.  Diagnosed with Barrett's in the residual esophagus.  Biopsies negative for dysplasia previously; history of multiple colonic adenomas removed previously.  Here for surveillance EGD and colonoscopy.  He has no lower GI tract symptoms.  Reflux controlled with Dexilant.  Denies dysphagia. ?He remains on Plavix for PAD. ? ?Past Medical History:  ?Diagnosis Date  ? Adenomatous colon polyp 03/2009  ? Last colonoscopy by Dr. [name]   ? Adenomatous polyp 2010  ? Adenomatous polyp of colon 11/03/2010  ? Arthritis   ? Barrett's esophagus   ? CAD (coronary artery disease)   ? Complication of anesthesia   ? has a shortened esophogus due to CA.   ? COPD (chronic obstructive pulmonary disease) ([place])   ? Severe emphysema per CT  ? Diverticulosis   ? Emphysema of lung ([place])   ? Esophageal carcinoma ([place]) 03/2009  ? T1N1M0  ? GERD (gastroesophageal reflux disease)   ? Glaucoma   ? History of Doppler ultrasound 11/09/2011  ? 03/2014- 50-69% L ICA stenosis;carotid doppler; L bulb/prox ICA 0-49% diameter reduction; L vertebral artery - occlusive ds; L ECA  demonstrates severe amount of fibrous plaque  ? History of Doppler ultrasound 11/09/2011  ? LEAs; R ABI - mod art. insuff.; L ABI normal at rest; R SFA - occlusive ds; L SFA - occlusive ds; patent fem-pop graft  ? History of echocardiogram 08/27/2009  ? EF >55%  ? History of hiatal hernia   ? History of kidney stones 1965  ? History of nuclear stress test 11/24/2011  ? lexiscan; normal perfusion; low risk scan; non-diagnostic for ischemia  ? Hyperlipidemia   ? Hypertension   ? Left carotid artery stenosis 04/08/2014  ? Pneumonia   ? Pre-diabetes   ? Pulmonary nodule, right  04/08/2014  ? 2.8 mm-incidental finding on CT  ? PVD (peripheral vascular disease) ([place])   ? [place]  ? Status post ablation at [facility]  ? Tobacco abuse   ? ? ?Past Surgical History:  ?Procedure Laterality Date  ? ABDOMINAL AORTOGRAM W/LOWER EXTREMITY N/A 04/01/2021  ? Procedure: ABDOMINAL AORTOGRAM W/LOWER EXTREMITY;  Surgeon: [name], MD;  Location: [place] [facility];  Service: Cardiovascular;  Laterality: N/A;  ? BIOPSY  10/24/2017  ? Procedure: BIOPSY;  Surgeon: [name], MD;  [facility];  Service: Endoscopy;;  esophagus  ? CATARACT EXTRACTION Bilateral   ? COLONOSCOPY  03/17/2009  ? Dr.Dalyn Becker- normal rectum, sigmoid diverticula, some pale sigmoid mucosa with diffuse petechiae. pedunculated polyp at the splenic flexure, remainder of colonic mucosa appeared normal. bx= adenomatous polyp  ? COLONOSCOPY  04/19/2003  ? Dr.Rehman- few diverticu;a at the sigmoid colon, 3 small polyps, one at the transverse colon and 2 at the sigmoid, small external hemorrhoids. bx report not available.   ? COLONOSCOPY WITH PROPOFOL N/A 10/24/2017  ? Dr. [name]: Diverticulosis, 11 mm polyp at the ileocecal valve, tubular adenoma.  Repeat colonoscopy in 3 years  ? CORONARY ARTERY BYPASS GRAFT  1998  ? [name]  ? ENDARTERECTOMY FEMORAL Right 04/13/2021  ? Procedure: RIGHT ILIOFEMORAL ARTERY ENDARTERECTOMY WITH PATCH ANGIOPLASTY USING HEMASHIELD [place];  Surgeon: [name], MD;  Location: [place];  Service: Vascular;  Laterality: Right;  ? ESOPHAGECTOMY  2010  ? [facility] Dr. [name]  ? ESOPHAGOGASTRODUODENOSCOPY  11/25/2010  ? Dr.Aarsh Fristoe- s/p esophagectomy with gastric pull-up, esophageal erosions straddling the surgical anastomosis, salmon colored epithelium coming up a good centimeter to a centimeter and a half above the suture line, islands of salmon colored epithelium in the most poximal residual esophagus, remainder of gastric mucosa appeared normal. bx= swamous  &gastric glandular mucosa w/chronic active inflammation  ? ESOPHAGOGASTRODUODENOSCOPY  03/17/2009  ? Dr.Hayven Croy- 4cm segment of salmon-colored epithlium distal esophagus suspicious for barretts esophagus. area of suspicious nodularity w/in this segment bx seperately. small to moderate size hiatal hernia, o/w normal stomach D1 and D2 bx=adenocarcinoma  ? ESOPHAGOGASTRODUODENOSCOPY (EGD) WITH PROPOFOL N/A 10/24/2017  ? Dr. [name]: Remnant of esophagus with anastomosis with stomach at 24 cm from the incisors, 2 cm above the anastomosis he was found to have Barrett's but no dysplasia.  Advised for repeat EGD in 3 years  ? EYE SURGERY    ? cataract  ? FEMORAL-POPLITEAL BYPASS GRAFT  1993  ? occlusive ds in R SFA  ? FEMORAL-POPLITEAL BYPASS GRAFT Left 05/11/2021  ? Procedure: LEFT FEMORAL-BELOW KNEE POPLITEAL ARTERY BYPASS GRAFT WITH 6 mm PTFE and COMPLETION ANGIOGRAM;  Surgeon: [name], MD;  Location: [place];  Service: Vascular;  Laterality: Left;  ? GASTRIC PULL THROUGH  2010  ? With esophagectomy  ? INSERTION OF ILIAC STENT Bilateral 04/13/2021  ? Procedure: INSERTION OF BILATERAL COMMON ILIAC KISSING STENTS AND INSERION OF RIGHT EXTERNAL ILIAC STENT;  Surgeon: [name], MD;  Location: [place];  Service: Vascular;  Laterality: Bilateral;  ? JOINT REPLACEMENT    ? LIPOMA EXCISION  2010  ? LOWER EXTREMITY ANGIOGRAM Left 04/13/2021  ? Procedure: LEFT LOWER EXTREMITY ANGIOGRAM ;  Surgeon: [name], MD;  Location: [place];  Service: Vascular;  Laterality: Left;  ? PERIPHERAL VASCULAR INTERVENTION  04/01/2021  ? Procedure: PERIPHERAL VASCULAR INTERVENTION;  Surgeon: [name], MD;  Location: [place] [facility];  Service: Cardiovascular;;  ? POLYPECTOMY  10/24/2017  ? Procedure: POLYPECTOMY;  Surgeon: [name], MD;  [facility];  Service: Endoscopy;;  colon ?  ? TOTAL HIP ARTHROPLASTY Left 01/30/2019  ? Procedure: TOTAL HIP ARTHROPLASTY ANTERIOR APPROACH;  Surgeon: Renette Butters,  MD;  Location: [facility];  Service: Orthopedics;  Laterality: Left;  ? ULTRASOUND GUIDANCE FOR VASCULAR ACCESS Left 04/13/2021  ? Procedure: ULTRASOUND GUIDANCE FOR VASCULAR ACCESS, left femoral;  Surgeon: [name], MD;  Location: [place];  Service: Vascular;  Laterality: Left;  ? ? ?Prior to Admission medications   ?Medication Sig Start Date End Date Taking? Authorizing Provider  ?acetaminophen (TYLENOL) 500 MG tablet Take 1,000 mg by mouth daily as needed for headache.   Yes [provider]  ?albuterol (VENTOLIN HFA) 108 (90 Base) MCG/ACT inhaler Inhale 2 puffs into the lungs every 6 (six) hours as needed for wheezing or shortness of breath.   Yes [provider]  ?ALPRAZolam (XANAX) 0.5 MG tablet Take 1 tablet (0.5 mg total) by mouth at bedtime. 10/07/14  Yes Troy Sine, MD  ?amLODipine (NORVASC) 10 MG tablet TAKE ONE (1) TABLET BY MOUTH EVERY DAY 05/01/21  Yes Troy Sine, MD  ?aspirin EC 81 MG tablet Take 1 tablet (81 mg total) by mouth daily. 01/02/21  Yes Jennye Boroughs, MD  ?baclofen (LIORESAL) 10 MG tablet Take 1 tablet (10 mg total) by mouth 3 (three) times  daily as needed for muscle spasms. 01/30/19  Yes Prudencio Burly III, PA-C  ?clopidogrel (PLAVIX) 75 MG tablet TAKE ONE (1) TABLET BY MOUTH EVERY DAY 05/26/21  Yes Troy Sine, MD  ?furosemide (LASIX) 40 MG tablet Take 40 mg by mouth daily as needed for fluid. 09/17/21  Yes [provider]  ?gabapentin (NEURONTIN) 100 MG capsule Take 100 mg by mouth 3 (three) times daily as needed for pain. 08/12/21  Yes [provider]  ?guaiFENesin (MUCINEX) 600 MG 12 hr tablet Take 600 mg by mouth every 12 (twelve) hours as needed (cough).   Yes [provider]  ?lansoprazole (PREVACID) 30 MG capsule Take 1 capsule (30 mg total) by mouth daily before supper. 09/07/21  Yes Mahala Menghini, PA-C  ?latanoprost (XALATAN) 0.005 % ophthalmic solution Place 1 drop into both eyes at bedtime.  01/10/13  Yes [provider]  ?losartan (COZAAR) 100 MG tablet TAKE ONE TABLET BY MOUTH EVERY NIGHT AT BEDTIME 02/11/21  Yes Troy Sine, MD  ?metoprolol succinate (TOPROL-XL) 50 MG 24 hr tablet TAKE ONE (1) TABLET BY MOUT

## 2021-10-22 NOTE — Transfer of Care (Signed)
Immediate Anesthesia Transfer of Care Note ? ?Patient: Gregory Eaton ? ?Procedure(s) Performed: COLONOSCOPY WITH PROPOFOL ?ESOPHAGOGASTRODUODENOSCOPY (EGD) WITH PROPOFOL ?BIOPSY ?POLYPECTOMY ? ?Patient Location: Short Stay ? ?Anesthesia Type:General ? ?Level of Consciousness: awake ? ?Airway & Oxygen Therapy: Patient Spontanous Breathing ? ?Post-op Assessment: Report given to RN ? ?Post vital signs: Reviewed ? ?Last Vitals:  ?Vitals Value Taken Time  ?BP 83/40 10/22/21 1117  ?Temp 36.7 ?C 10/22/21 1117  ?Pulse 72 10/22/21 1117  ?Resp 12 10/22/21 1117  ?SpO2 95 % 10/22/21 1117  ? ? ?Last Pain:  ?Vitals:  ? 10/22/21 1117  ?TempSrc: Oral  ?PainSc: 0-No pain  ?   ? ?Patients Stated Pain Goal: 5 (10/22/21 3329) ? ?Complications: No notable events documented. ?

## 2021-10-22 NOTE — Addendum Note (Signed)
Addendum  created 10/22/21 1201 by Ollen Bowl, CRNA  ? Charge Capture section accepted  ?  ?

## 2021-10-22 NOTE — Discharge Instructions (Addendum)
?Colonoscopy ?Discharge Instructions ? ?Read the instructions outlined below and refer to this sheet in the next few weeks. These discharge instructions provide you with general information on caring for yourself after you leave the hospital. Your doctor may also give you specific instructions. While your treatment has been planned according to the most current medical practices available, unavoidable complications occasionally occur. If you have any problems or questions after discharge, call Dr. [name] at [phone]. ?ACTIVITY ?You may resume your regular activity, but move at a slower pace for the next 24 hours.  ?Take frequent rest periods for the next 24 hours.  ?Walking will help get rid of the air and reduce the bloated feeling in your belly (abdomen).  ?No driving for 24 hours (because of the medicine (anesthesia) used during the test).   ?Do not sign any important legal documents or operate any machinery for 24 hours (because of the anesthesia used during the test).  ?NUTRITION ?Drink plenty of fluids.  ?You may resume your normal diet as instructed by your doctor.  ?Begin with a light meal and progress to your normal diet. Heavy or fried foods are harder to digest and may make you feel sick to your stomach (nauseated).  ?Avoid alcoholic beverages for 24 hours or as instructed.  ?MEDICATIONS ?You may resume your normal medications unless your doctor tells you otherwise.  ?WHAT YOU CAN EXPECT TODAY ?Some feelings of bloating in the abdomen.  ?Passage of more gas than usual.  ?Spotting of blood in your stool or on the toilet paper.  ?IF YOU HAD POLYPS REMOVED DURING THE COLONOSCOPY: ?No aspirin products for 7 days or as instructed.  ?No alcohol for 7 days or as instructed.  ?Eat a soft diet for the next 24 hours.  ?FINDING OUT THE RESULTS OF YOUR TEST ?Not all test results are available during your visit. If your test results are not back during the visit, make an appointment with your caregiver to find out the  results. Do not assume everything is normal if you have not heard from your caregiver or the medical facility. It is important for you to follow up on all of your test results.  ?SEEK IMMEDIATE MEDICAL ATTENTION IF: ?You have more than a spotting of blood in your stool.  ?Your belly is swollen (abdominal distention).  ?You are nauseated or vomiting.  ?You have a temperature over 101.  ?You have abdominal pain or discomfort that is severe or gets worse throughout the day.   ?EGD ?Discharge instructions ?Please read the instructions outlined below and refer to this sheet in the next few weeks. These discharge instructions provide you with general information on caring for yourself after you leave the hospital. Your doctor may also give you specific instructions. While your treatment has been planned according to the most current medical practices available, unavoidable complications occasionally occur. If you have any problems or questions after discharge, please call your doctor. ?ACTIVITY ?You may resume your regular activity but move at a slower pace for the next 24 hours.  ?Take frequent rest periods for the next 24 hours.  ?Walking will help expel (get rid of) the air and reduce the bloated feeling in your abdomen.  ?No driving for 24 hours (because of the anesthesia (medicine) used during the test).  ?You may shower.  ?Do not sign any important legal documents or operate any machinery for 24 hours (because of the anesthesia used during the test).  ?NUTRITION ?Drink plenty of fluids.  ?You may  resume your normal diet.  ?Begin with a light meal and progress to your normal diet.  ?Avoid alcoholic beverages for 24 hours or as instructed by your caregiver.  ?MEDICATIONS ?You may resume your normal medications unless your caregiver tells you otherwise.  ?WHAT YOU CAN EXPECT TODAY ?You may experience abdominal discomfort such as a feeling of fullness or ?gas? pains.  ?FOLLOW-UP ?Your doctor will discuss the results of  your test with you.  ?SEEK IMMEDIATE MEDICAL ATTENTION IF ANY OF THE FOLLOWING OCCUR: ?Excessive nausea (feeling sick to your stomach) and/or vomiting.  ?Severe abdominal pain and distention (swelling).  ?Trouble swallowing.  ?Temperature over 101? F (37.8? C).  ?Rectal bleeding or vomiting of blood.   ? ? ?Biopsies of your esophagus taken.  2 polyps removed from your colon ? ? ?Further recommendations to follow pending review of pathology report ? ? ?Office visit with [place] in 1 year ? ? ?At patient request, I called Rod Majerus at [phone] -reviewed findings and recommendations ?

## 2021-10-22 NOTE — Op Note (Signed)
[facility] ?Patient Name: Gregory Eaton ?Procedure Date: 10/22/2021 10:01 AM ?MRN: [id number] ?Date of Birth: 13-Nov-1945 ?Attending MD: Norvel Richards , MD ?CSN: [id number] ?Age: 76 ?Admit Type: Outpatient ?Procedure:                Upper GI endoscopy ?Indications:              Screening for Barrett's esophagus ?Providers:                Norvel Richards, MD, Janeece Riggers, RN, Ulice Dash  ?                          Blima Singer, Technician ?Referring MD:              ?Medicines:                Propofol per Anesthesia ?Complications:            No immediate complications. ?Estimated Blood Loss:     Estimated blood loss was minimal. ?Procedure:                Pre-Anesthesia Assessment: ?                          - Prior to the procedure, a History and Physical  ?                          was performed, and patient medications and  ?                          allergies were reviewed. The patient's tolerance of  ?                          previous anesthesia was also reviewed. The risks  ?                          and benefits of the procedure and the sedation  ?                          options and risks were discussed with the patient.  ?                          All questions were answered, and informed consent  ?                          was obtained. Prior Anticoagulants: The patient  ?                          last took Plavix (clopidogrel) 1 day prior to the  ?                          procedure. ASA Grade Assessment: III - A patient  ?                          with severe systemic disease. After reviewing the  ?  risks and benefits, the patient was deemed in  ?                          satisfactory condition to undergo the procedure. ?                          After obtaining informed consent, the endoscope was  ?                          passed under direct vision. Throughout the  ?                          procedure, the patient's blood pressure, pulse, and  ?                           oxygen saturations were monitored continuously. The  ?                          GIF-H190 ([id number]) scope was introduced through the  ?                          mouth, and advanced to the second part of duodenum.  ?                          The upper GI endoscopy was accomplished without  ?                          difficulty. The patient tolerated the procedure  ?                          well. ?Scope In: 10:38:04 AM ?Scope Out: 10:44:53 AM ?Total Procedure Duration: 0 hours 6 minutes 49 seconds  ?Findings: ?     Anastomosis with stomach found at 24 cm. Again, approximately 2 cm  ?     circumferential salmon-colored epithelium coming proximal from the Neo  ?     GE junction. No nodularity. No esophagitis. No stricture. Gastric cavity  ?     appeared normal with normal mucosa. Patent pylorus. Normal-appearing  ?     first and second portion of the duodenum. Circumferential biopsies of  ?     the salmon-colored epithelium taken for histologic study. ?Impression:               - Status post esophagectomy with gastric pull-up.  ?                          Abnormal mucosa at anastomosis consistent with  ?                          Barrett's esophagus???status post biopsy. No evidence  ?                          of recurrent tumor. No specimens collected. ?Moderate Sedation: ?     Moderate (conscious) sedation was personally administered by an  ?     anesthesia professional. The following parameters were monitored: oxygen  ?  saturation, heart rate, blood pressure, respiratory rate, EKG, adequacy  ?     of pulmonary ventilation, and response to care. ?Recommendation:           - Patient has a contact number available for  ?                          emergencies. The signs and symptoms of potential  ?                          delayed complications were discussed with the  ?                          patient. Return to normal activities tomorrow.  ?                          Written discharge instructions were provided  to the  ?                          patient. ?                          - Advance diet as tolerated. ?                          - Continue present medications. ?                          - Await pathology results. ?                          - - Return to GI clinic in 1 year. See colonoscopy  ?                          report. ?Procedure Code(s):        --- Professional --- ?                          [phone], Esophagogastroduodenoscopy, flexible,  ?                          transoral; diagnostic, including collection of  ?                          specimen(s) by brushing or washing, when performed  ?                          (separate procedure) ?Diagnosis Code(s):        --- Professional --- ?                          N98.921, Encounter for screening for upper  ?                          gastrointestinal disorder ?CPT copyright 2019 American Medical Association. All rights reserved. ?The codes documented in this report are preliminary and upon coder review may  ?be revised to meet current compliance requirements. ?Herbie Baltimore  Hilton Cork, MD ?Norvel Richards, MD ?10/22/2021 11:12:05 AM ?This report has been signed electronically. ?Number of Addenda: 0 ?

## 2021-10-22 NOTE — Op Note (Signed)
[facility] ?Patient Name: Gregory Eaton ?Procedure Date: 10/22/2021 10:06 AM ?MRN: [id number] ?Date of Birth: Mar 20, 1946 ?Attending MD: Norvel Richards , MD ?CSN: [id number] ?Age: 76 ?Admit Type: Outpatient ?Procedure:                Colonoscopy ?Indications:              High risk colon cancer surveillance: Personal  ?                          history of colonic polyps ?Providers:                Norvel Richards, MD, Janeece Riggers, RN, Ulice Dash  ?                          Blima Singer, Technician ?Referring MD:              ?Medicines:                Propofol per Anesthesia ?Complications:            No immediate complications. ?Estimated Blood Loss:     Estimated blood loss was minimal. ?Procedure:                Pre-Anesthesia Assessment: ?                          - Prior to the procedure, a History and Physical  ?                          was performed, and patient medications and  ?                          allergies were reviewed. The patient's tolerance of  ?                          previous anesthesia was also reviewed. The risks  ?                          and benefits of the procedure and the sedation  ?                          options and risks were discussed with the patient.  ?                          All questions were answered, and informed consent  ?                          was obtained. Prior Anticoagulants: The patient  ?                          last took Plavix (clopidogrel) 1 day prior to the  ?                          procedure. ASA Grade Assessment: III - A patient  ?  with severe systemic disease. After reviewing the  ?                          risks and benefits, the patient was deemed in  ?                          satisfactory condition to undergo the procedure. ?                          After obtaining informed consent, the colonoscope  ?                          was passed under direct vision. Throughout the  ?                          procedure,  the patient's blood pressure, pulse, and  ?                          oxygen saturations were monitored continuously. The  ?                          [id number]) scope was introduced through the  ?                          anus and advanced to the the cecum, identified by  ?                          appendiceal orifice and ileocecal valve. The  ?                          colonoscopy was performed without difficulty. The  ?                          patient tolerated the procedure well. The quality  ?                          of the bowel preparation was adequate. ?Scope In: 10:50:12 AM ?Scope Out: 11:08:56 AM ?Scope Withdrawal Time: 0 hours 12 minutes 56 seconds  ?Total Procedure Duration: 0 hours 18 minutes 44 seconds  ?Findings: ?     The perianal and digital rectal examinations were normal. ?     Many small and large-mouthed diverticula were found in the sigmoid colon  ?     and descending colon. ?     Two semi-pedunculated polyps were found in the hepatic flexure. The  ?     polyps were 3 to 4 mm in size. These polyps were removed with a cold  ?     snare. Resection and retrieval were complete. Estimated blood loss was  ?     minimal. ?     The exam was otherwise without abnormality on direct and retroflexion  ?     views. ?Impression:               - Diverticulosis in the sigmoid colon and in the  ?  descending colon. ?                          - Two 3 to 4 mm polyps at the hepatic flexure,  ?                          removed with a cold snare. Resected and retrieved. ?                          - The examination was otherwise normal on direct  ?                          and retroflexion views. See EGD report ?Moderate Sedation: ?     Moderate (conscious) sedation was personally administered by an  ?     anesthesia professional. The following parameters were monitored: oxygen  ?     saturation, heart rate, blood pressure, respiratory rate, EKG, adequacy  ?     of pulmonary  ventilation, and response to care. ?Recommendation:           - Patient has a contact number available for  ?                          emergencies. The signs and symptoms of potential  ?                          delayed complications were discussed with the  ?                          patient. Return to normal activities tomorrow.  ?                          Written discharge instructions were provided to the  ?                          patient. ?                          - Advance diet as tolerated. ?Procedure Code(s):        --- Professional --- ?                          [phone], Colonoscopy, flexible; with removal of  ?                          tumor(s), polyp(s), or other lesion(s) by snare  ?                          technique ?Diagnosis Code(s):        --- Professional --- ?                          Z86.010, Personal history of colonic polyps ?                          K63.5, Polyp of colon ?  K57.30, Diverticulosis of large intestine without  ?                          perforation or abscess without bleeding ?CPT copyright 2019 American Medical Association. All rights reserved. ?The codes documented in this report are preliminary and upon coder review may  ?be revised to meet current compliance requirements. ?Cristopher Estimable. Tawnie Ehresman, MD ?Norvel Richards, MD ?10/22/2021 11:19:25 AM ?This report has been signed electronically. ?Number of Addenda: 0 ?

## 2021-10-23 ENCOUNTER — Encounter: Payer: Self-pay | Admitting: Cardiology

## 2021-10-23 ENCOUNTER — Ambulatory Visit: Payer: Medicare Other | Admitting: Cardiology

## 2021-10-23 VITALS — BP 110/52 | HR 68 | Ht 68.0 in | Wt 158.8 lb

## 2021-10-23 DIAGNOSIS — I739 Peripheral vascular disease, unspecified: Secondary | ICD-10-CM | POA: Diagnosis not present

## 2021-10-23 DIAGNOSIS — I10 Essential (primary) hypertension: Secondary | ICD-10-CM | POA: Diagnosis not present

## 2021-10-23 DIAGNOSIS — I25118 Atherosclerotic heart disease of native coronary artery with other forms of angina pectoris: Secondary | ICD-10-CM | POA: Diagnosis not present

## 2021-10-23 DIAGNOSIS — E782 Mixed hyperlipidemia: Secondary | ICD-10-CM | POA: Diagnosis not present

## 2021-10-23 LAB — SURGICAL PATHOLOGY

## 2021-10-23 NOTE — Progress Notes (Signed)
? ? ? ?Clinical Summary ?Gregory Eaton is a 76 y.o.male seen today for follow up of the following medical problems. ?Previously followed by Dr Claiborne Billings ? ?1.CAD ?- He is s/p CABG in 1998 with low-risk NST in 07/2013 ?- 01/2021 nuclear stress: mild apical inferior ischemia, low risk study ?- 12/2020 echo: LVEF 55-60%, no WMAs, grade Idd ? ?- ER eval Jan 2023 with chest pain, thought to be GERD. Trops neg, EKG withour acute ischemic changesSymptoms improved on antacid ? ?- episode 3-4 weeks ago while walking. Pressure midchest, 8/10 in severity. No other associated symptoms. Resolved after 3-4 minutes. No recurrence.  ? ? ? ?2. Syncope ?- admission 12/2020 with syncope ?- thought to be due to hypovolemia/dehydration, was hypotesiven SBPs to 60s resolved with IVFs. Aldactone was tsopped ?- no recent symptoms ? ? ?3. PAD ?- He is s/p fem-pop bypass in 1993 and known occluded right SFA.  ?- prior bilateral common iliac artery stenting, bilateral external iliac artery stenting. ?- 03/2021 right iliofemoral endarectomy ?- 04/2021 left femoral below knee pop bypass ?- upcoming appt with Dr Virl Cagey next week ?- denies any significant claudication.  ?- on extended DAPT ? ?4. HTN ?- compliant with meds ?- home bp's 130s/60-70s.  ? ?5. Hyperliidemia ?- 10/2020 TC 90 TG 94 HDL 41 LDL 31 ? ?6. Severe COPD ?- followed by pulmionary ? ?7. History of esopaghael cancer ? ? ?Past Medical History:  ?Diagnosis Date  ? Adenomatous colon polyp 03/2009  ? Last colonoscopy by Dr. [name]   ? Adenomatous polyp 2010  ? Adenomatous polyp of colon 11/03/2010  ? Arthritis   ? Barrett's esophagus   ? CAD (coronary artery disease)   ? Complication of anesthesia   ? has a shortened esophogus due to CA.   ? COPD (chronic obstructive pulmonary disease) ([place])   ? Severe emphysema per CT  ? Diverticulosis   ? Emphysema of lung ([place])   ? Esophageal carcinoma ([place]) 03/2009  ? T1N1M0  ? GERD (gastroesophageal reflux disease)   ? Glaucoma   ? History of Doppler  ultrasound 11/09/2011  ? 03/2014- 50-69% L ICA stenosis;carotid doppler; L bulb/prox ICA 0-49% diameter reduction; L vertebral artery - occlusive ds; L ECA  demonstrates severe amount of fibrous plaque  ? History of Doppler ultrasound 11/09/2011  ? LEAs; R ABI - mod art. insuff.; L ABI normal at rest; R SFA - occlusive ds; L SFA - occlusive ds; patent fem-pop graft  ? History of echocardiogram 08/27/2009  ? EF >55%  ? History of hiatal hernia   ? History of kidney stones 1965  ? History of nuclear stress test 11/24/2011  ? lexiscan; normal perfusion; low risk scan; non-diagnostic for ischemia  ? Hyperlipidemia   ? Hypertension   ? Left carotid artery stenosis 04/08/2014  ? Pneumonia   ? Pre-diabetes   ? Pulmonary nodule, right 04/08/2014  ? 2.8 mm-incidental finding on CT  ? PVD (peripheral vascular disease) ([place])   ? [place]  ? Status post ablation at [facility]  ? Tobacco abuse   ? ? ? ?Allergies  ?Allergen Reactions  ? Altace [Ramipril] Cough  ? ? ? ?Current Outpatient Medications  ?Medication Sig Dispense Refill  ? acetaminophen (TYLENOL) 500 MG tablet Take 1,000 mg by mouth daily as needed for headache.    ? albuterol (PROVENTIL) (2.5 MG/3ML) 0.083% nebulizer solution Take 3 mLs (2.5 mg total) by nebulization every 6 (six) hours as needed for wheezing or shortness of breath. DX:  J44.9 75 mL 6  ? albuterol (VENTOLIN HFA) 108 (90 Base) MCG/ACT inhaler Inhale 2 puffs into the lungs every 6 (six) hours as needed for wheezing or shortness of breath.    ? ALPRAZolam (XANAX) 0.5 MG tablet Take 1 tablet (0.5 mg total) by mouth at bedtime. 30 tablet 0  ? amLODipine (NORVASC) 10 MG tablet TAKE ONE (1) TABLET BY MOUTH EVERY DAY 90 tablet 3  ? aspirin EC 81 MG tablet Take 1 tablet (81 mg total) by mouth daily.    ? baclofen (LIORESAL) 10 MG tablet Take 1 tablet (10 mg total) by mouth 3 (three) times daily as needed for muscle spasms. 20 each 0  ? clopidogrel (PLAVIX) 75 MG tablet TAKE ONE (1) TABLET BY MOUTH EVERY  DAY 90 tablet 0  ? furosemide (LASIX) 40 MG tablet Take 40 mg by mouth daily as needed for fluid.    ? gabapentin (NEURONTIN) 100 MG capsule Take 100 mg by mouth 3 (three) times daily as needed for pain.    ? guaiFENesin (MUCINEX) 600 MG 12 hr tablet Take 600 mg by mouth every 12 (twelve) hours as needed (cough).    ? lansoprazole (PREVACID) 30 MG capsule Take 1 capsule (30 mg total) by mouth daily before supper. 90 capsule 3  ? latanoprost (XALATAN) 0.005 % ophthalmic solution Place 1 drop into both eyes at bedtime.     ? losartan (COZAAR) 100 MG tablet TAKE ONE TABLET BY MOUTH EVERY NIGHT AT BEDTIME 90 tablet 3  ? metoprolol succinate (TOPROL-XL) 50 MG 24 hr tablet TAKE ONE (1) TABLET BY MOUTH EVERY DAY 90 tablet 1  ? montelukast (SINGULAIR) 10 MG tablet Take 1 tablet (10 mg total) by mouth daily. 90 tablet 4  ? Multiple Vitamins-Minerals (CENTRUM SILVER PO) Take 1 tablet by mouth daily.    ? nitroGLYCERIN (NITROSTAT) 0.4 MG SL tablet Place 1 tablet (0.4 mg total) under the tongue every 5 (five) minutes as needed for chest pain. 25 tablet 0  ? rosuvastatin (CRESTOR) 20 MG tablet Take 1 tablet (20 mg total) by mouth daily. 30 tablet 3  ? sucralfate (CARAFATE) 1 g tablet Take 1 tablet (1 g total) by mouth 4 (four) times daily -  with meals and at bedtime. As needed for esophageal pain, heartburn. You can crush in couple of tablespoons of water to make suspension. 30 tablet 0  ? timolol (TIMOPTIC) 0.5 % ophthalmic solution Place 1 drop into both eyes in the morning and at bedtime.    ? umeclidinium-vilanterol (ANORO ELLIPTA) 62.5-25 MCG/ACT AEPB Inhale 1 puff into the lungs daily. 60 each 5  ? ?No current facility-administered medications for this visit.  ? ? ? ?Past Surgical History:  ?Procedure Laterality Date  ? ABDOMINAL AORTOGRAM W/LOWER EXTREMITY N/A 04/01/2021  ? Procedure: ABDOMINAL AORTOGRAM W/LOWER EXTREMITY;  Surgeon: [name], MD;  Location: [place] [facility];  Service: Cardiovascular;   Laterality: N/A;  ? BIOPSY  10/24/2017  ? Procedure: BIOPSY;  Surgeon: [name], MD;  [facility];  Service: Endoscopy;;  esophagus  ? CATARACT EXTRACTION Bilateral   ? COLONOSCOPY  03/17/2009  ? Dr.Rourk- normal rectum, sigmoid diverticula, some pale sigmoid mucosa with diffuse petechiae. pedunculated polyp at the splenic flexure, remainder of colonic mucosa appeared normal. bx= adenomatous polyp  ? COLONOSCOPY  04/19/2003  ? Dr.Rehman- few diverticu;a at the sigmoid colon, 3 small polyps, one at the transverse colon and 2 at the sigmoid, small external hemorrhoids. bx report  not available.   ? COLONOSCOPY WITH PROPOFOL N/A 10/24/2017  ? Dr. [name]: Diverticulosis, 11 mm polyp at the ileocecal valve, tubular adenoma.  Repeat colonoscopy in 3 years  ? CORONARY ARTERY BYPASS GRAFT  1998  ? [name]  ? ENDARTERECTOMY FEMORAL Right 04/13/2021  ? Procedure: RIGHT ILIOFEMORAL ARTERY ENDARTERECTOMY WITH PATCH ANGIOPLASTY USING HEMASHIELD [place];  Surgeon: [name], MD;  [facility] OR;  Service: Vascular;  Laterality: Right;  ? ESOPHAGECTOMY  2010  ? [facility] Dr. [name]  ? ESOPHAGOGASTRODUODENOSCOPY  11/25/2010  ? Dr.Rourk- s/p esophagectomy with gastric pull-up, esophageal erosions straddling the surgical anastomosis, salmon colored epithelium coming up a good centimeter to a centimeter and a half above the suture line, islands of salmon colored epithelium in the most poximal residual esophagus, remainder of gastric mucosa appeared normal. bx= swamous &gastric glandular mucosa w/chronic active inflammation  ? ESOPHAGOGASTRODUODENOSCOPY  03/17/2009  ? Dr.Rourk- 4cm segment of salmon-colored epithlium distal esophagus suspicious for barretts esophagus. area of suspicious nodularity w/in this segment bx seperately. small to moderate size hiatal hernia, o/w normal stomach D1 and D2 bx=adenocarcinoma  ? ESOPHAGOGASTRODUODENOSCOPY (EGD) WITH PROPOFOL N/A 10/24/2017  ? Dr.  [name]: Remnant of esophagus with anastomosis with stomach at 24 cm from the incisors, 2 cm above the anastomosis he was found to have Barrett's but no dysplasia.  Advised for repeat EGD in 3 years  ? EYE SURGERY    ? c

## 2021-10-23 NOTE — Patient Instructions (Addendum)
Follow-Up: °Follow up with Dr. Branch in 6 months ° °Any Other Special Instructions Will Be Listed Below (If Applicable). ° ° ° ° °If you need a refill on your cardiac medications before your next appointment, please call your pharmacy. ° °

## 2021-10-24 ENCOUNTER — Encounter: Payer: Self-pay | Admitting: Internal Medicine

## 2021-10-28 ENCOUNTER — Encounter (HOSPITAL_COMMUNITY): Payer: Self-pay | Admitting: Internal Medicine

## 2021-10-29 NOTE — Progress Notes (Signed)
?Clinic Note  ? ? ? ? ?HPI: Gregory Eaton is a 76 y.o. (01-22-1946) male presenting in follow up s/p 05/11/21 left femoral to below-knee popliteal artery bypass w/ ringed PTFE.  Gregory Eaton has had multiple vascular surgery interventions including 03/2021 right iliofemoral endarterectomy, bilateral common iliac artery stenting, bilateral external iliac artery stenting. Patient also had a previous greater saphenous vein bypass in 1993 with Dr. Virginia Eaton. ? ?Since seen 3 months ago, Gregory Eaton has been doing well with no complaints.  He is able to walk with no claudication symptoms, does appreciate legs being tired after working outside all day.  Denies rest pain, denies wounds. ? ?The pt is  on a statin for cholesterol management.  ?The pt is  on a daily aspirin.   Other AC:  plavix ?The pt is  on medication for hypertension.   ?The pt is not diabetic.  ?Tobacco hx:  former ? ?Past Medical History:  ?Diagnosis Date  ? Adenomatous colon polyp 03/2009  ? Last colonoscopy by Dr. [name]   ? Adenomatous polyp 2010  ? Adenomatous polyp of colon 11/03/2010  ? Arthritis   ? Barrett's esophagus   ? CAD (coronary artery disease)   ? Complication of anesthesia   ? has a shortened esophogus due to CA.   ? COPD (chronic obstructive pulmonary disease) ([place])   ? Severe emphysema per CT  ? Diverticulosis   ? Emphysema of lung ([place])   ? Esophageal carcinoma ([place]) 03/2009  ? T1N1M0  ? GERD (gastroesophageal reflux disease)   ? Glaucoma   ? History of Doppler ultrasound 11/09/2011  ? 03/2014- 50-69% L ICA stenosis;carotid doppler; L bulb/prox ICA 0-49% diameter reduction; L vertebral artery - occlusive ds; L ECA  demonstrates severe amount of fibrous plaque  ? History of Doppler ultrasound 11/09/2011  ? LEAs; R ABI - mod art. insuff.; L ABI normal at rest; R SFA - occlusive ds; L SFA - occlusive ds; patent fem-pop graft  ? History of echocardiogram 08/27/2009  ? EF >55%  ? History of hiatal hernia   ? History of kidney stones 1965  ? History  of nuclear stress test 11/24/2011  ? lexiscan; normal perfusion; low risk scan; non-diagnostic for ischemia  ? Hyperlipidemia   ? Hypertension   ? Left carotid artery stenosis 04/08/2014  ? Pneumonia   ? Pre-diabetes   ? Pulmonary nodule, right 04/08/2014  ? 2.8 mm-incidental finding on CT  ? PVD (peripheral vascular disease) ([place])   ? [place]  ? Status post ablation at [facility]  ? Tobacco abuse   ? ? ?Past Surgical History:  ?Procedure Laterality Date  ? ABDOMINAL AORTOGRAM W/LOWER EXTREMITY N/A 04/01/2021  ? Procedure: ABDOMINAL AORTOGRAM W/LOWER EXTREMITY;  Surgeon: [name], MD;  Location: [place] [facility];  Service: Cardiovascular;  Laterality: N/A;  ? BIOPSY  10/24/2017  ? Procedure: BIOPSY;  Surgeon: [name], MD;  [facility];  Service: Endoscopy;;  esophagus  ? BIOPSY  10/22/2021  ? Procedure: BIOPSY;  Surgeon: [name], MD;  [facility];  Service: Endoscopy;;  ? CATARACT EXTRACTION Bilateral   ? COLONOSCOPY  03/17/2009  ? Dr.Rourk- normal rectum, sigmoid diverticula, some pale sigmoid mucosa with diffuse petechiae. pedunculated polyp at the splenic flexure, remainder of colonic mucosa appeared normal. bx= adenomatous polyp  ? COLONOSCOPY  04/19/2003  ? Dr.Rehman- few diverticu;a at the sigmoid colon, 3 small polyps, one at the transverse colon and 2 at the  sigmoid, small external hemorrhoids. bx report not available.   ? COLONOSCOPY WITH PROPOFOL N/A 10/24/2017  ? Dr. [name]: Diverticulosis, 11 mm polyp at the ileocecal valve, tubular adenoma.  Repeat colonoscopy in 3 years  ? COLONOSCOPY WITH PROPOFOL N/A 10/22/2021  ? Procedure: COLONOSCOPY WITH PROPOFOL;  Surgeon: [name], MD;  [facility];  Service: Endoscopy;  Laterality: N/A;  11:00am  ? CORONARY ARTERY BYPASS GRAFT  1998  ? Gregory Eaton  ? ENDARTERECTOMY FEMORAL Right 04/13/2021  ? Procedure: RIGHT ILIOFEMORAL ARTERY ENDARTERECTOMY WITH PATCH ANGIOPLASTY USING HEMASHIELD [place];  Surgeon: [name], MD;  Location: [facility] OR;  Service: Vascular;  Laterality: Right;  ? ES[facility]OMY  2010  ? [facility] Dr. Carlyle Eaton  ? ESOPHAGOGASTRODUODENOSCOPY  11/25/2010  ? Dr.Rourk- s/p esophagectomy with gastric pull-up, esophageal erosions straddling the surgical anastomosis, salmon colored epithelium coming up a good centimeter to a centimeter and a half above the suture line, islands of salmon colored epithelium in the most poximal residual esophagus, remainder of gastric mucosa appeared normal. bx= swamous &gastric glandular mucosa w/chronic active inflammation  ? ESOPHAGOGASTRODUODENOSCOPY  03/17/2009  ? Dr.Rourk- 4cm segment of salmon-colored epithlium distal esophagus suspicious for barretts esophagus. area of suspicious nodularity w/in this segment bx seperately. small to moderate size hiatal hernia, o/w normal stomach D1 and D2 bx=adenocarcinoma  ? ESOPHAGOGASTRODUODENOSCOPY (EGD) WITH PROPOFOL N/A 10/24/2017  ? Dr. [name]: Remnant of esophagus with anastomosis with stomach at 24 cm from the incisors, 2 cm above the anastomosis he was found to have Barrett's but no dysplasia.  Advised for repeat EGD in 3 years  ? ESOPHAGOGASTRODUODENOSCOPY (EGD) WITH PROPOFOL N/A 10/22/2021  ? Procedure: ESOPHAGOGASTRODUODENOSCOPY (EGD) WITH PROPOFOL;  Surgeon: [name], MD;  [facility];  Service: Endoscopy;  Laterality: N/A;  ? EYE SURGERY    ? cataract  ? FEMORAL-POPLITEAL BYPASS GRAFT  1993  ? occlusive ds in R SFA  ? FEMORAL-POPLITEAL BYPASS GRAFT Left 05/11/2021  ? Procedure: LEFT FEMORAL-BELOW KNEE POPLITEAL ARTERY BYPASS GRAFT WITH 6 mm PTFE and COMPLETION ANGIOGRAM;  Surgeon: [name], MD;  Location: [place];  Service: Vascular;  Laterality: Left;  ? GASTRIC PULL THROUGH  2010  ? With esophagectomy  ? INSERTION OF ILIAC STENT Bilateral 04/13/2021  ? Procedure: INSERTION OF BILATERAL COMMON ILIAC KISSING STENTS AND INSERION OF RIGHT EXTERNAL ILIAC STENT;   Surgeon: [name], MD;  Location: [place];  Service: Vascular;  Laterality: Bilateral;  ? JOINT REPLACEMENT    ? LIPOMA EXCISION  2010  ? LOWER EXTREMITY ANGIOGRAM Left 04/13/2021  ? Procedure: LEFT LOWER EXTREMITY ANGIOGRAM ;  Surgeon: [name], MD;  Location: [place];  Service: Vascular;  Laterality: Left;  ? PERIPHERAL VASCULAR INTERVENTION  04/01/2021  ? Procedure: PERIPHERAL VASCULAR INTERVENTION;  Surgeon: [name], MD;  Location: [place] [facility];  Service: Cardiovascular;;  ? POLYPECTOMY  10/24/2017  ? Procedure: POLYPECTOMY;  Surgeon: [name], MD;  [facility];  Service: Endoscopy;;  colon ?  ? POLYPECTOMY  10/22/2021  ? Procedure: POLYPECTOMY;  Surgeon: [name], MD;  [facility];  Service: Endoscopy;;  ? TOTAL HIP ARTHROPLASTY Left 01/30/2019  ? Procedure: TOTAL HIP ARTHROPLASTY ANTERIOR APPROACH;  Surgeon: Renette Butters, MD;  Location: [facility];  Service: Orthopedics;  Laterality: Left;  ? ULTRASOUND GUIDANCE FOR VASCULAR ACCESS Left 04/13/2021  ? Procedure: ULTRASOUND GUIDANCE FOR VASCULAR ACCESS, left femoral;  Surgeon: Virl Cagey,  Carolann Littler, MD;  Location: [place];  Service: Vascular;  Laterality: Left;  ? ? ?Social History  ? ?Socioeconomic History  ? Marital status: Married  ?  Spouse name: Not on file  ? Number of children: 2  ? Years of education: Not on file  ? Highest education level: Not on file  ?Occupational History  ? Occupation: retired  ?  Employer: RETIRED  ?  Comment: truck driver  ?Tobacco Use  ? Smoking status: Former  ?  Packs/day: 2.00  ?  Years: 40.00  ?  Pack years: 80.00  ?  Types: Cigarettes  ?  Start date: 68  ?  Quit date: 07/20/1995  ?  Years since quitting: 26.2  ? Smokeless tobacco: Never  ?Vaping Use  ? Vaping Use: Never used  ?Substance and Sexual Activity  ? Alcohol use: No  ?  Alcohol/week: 0.0 standard drinks  ? Drug use: No  ? Sexual activity: Yes  ?  Partners: Female  ?  Birth control/protection: Condom  ?  Comment:  friend  ?Other Topics Concern  ? Not on file  ?Social History Narrative  ? Not on file  ? ?Social Determinants of Health  ? ?Financial Resource Strain: Not on file  ?Food Insecurity: Not on file  ?Trans

## 2021-10-30 ENCOUNTER — Encounter: Payer: Self-pay | Admitting: Vascular Surgery

## 2021-10-30 ENCOUNTER — Ambulatory Visit: Payer: Medicare Other | Admitting: Vascular Surgery

## 2021-10-30 ENCOUNTER — Ambulatory Visit (HOSPITAL_COMMUNITY)
Admission: RE | Admit: 2021-10-30 | Discharge: 2021-10-30 | Disposition: A | Discharge status: Home / Self Care | Payer: Medicare Other | Source: Ambulatory Visit | Attending: Vascular Surgery | Admitting: Vascular Surgery

## 2021-10-30 VITALS — BP 166/74 | HR 66 | Temp 97.9°F | Resp 20 | Ht 68.0 in | Wt 153.0 lb

## 2021-10-30 DIAGNOSIS — T82898A Other specified complication of vascular prosthetic devices, implants and grafts, initial encounter: Secondary | ICD-10-CM | POA: Insufficient documentation

## 2021-10-30 DIAGNOSIS — Z95828 Presence of other vascular implants and grafts: Secondary | ICD-10-CM

## 2021-10-30 DIAGNOSIS — I739 Peripheral vascular disease, unspecified: Secondary | ICD-10-CM | POA: Diagnosis not present

## 2021-11-06 ENCOUNTER — Other Ambulatory Visit: Payer: Self-pay | Admitting: *Deleted

## 2021-11-06 DIAGNOSIS — Z95828 Presence of other vascular implants and grafts: Secondary | ICD-10-CM

## 2021-11-06 DIAGNOSIS — I739 Peripheral vascular disease, unspecified: Secondary | ICD-10-CM

## 2021-11-06 DIAGNOSIS — I6522 Occlusion and stenosis of left carotid artery: Secondary | ICD-10-CM

## 2021-11-30 DIAGNOSIS — R040 Epistaxis: Secondary | ICD-10-CM | POA: Insufficient documentation

## 2022-01-27 DIAGNOSIS — F5104 Psychophysiologic insomnia: Secondary | ICD-10-CM | POA: Insufficient documentation

## 2022-01-27 DIAGNOSIS — G629 Polyneuropathy, unspecified: Secondary | ICD-10-CM | POA: Insufficient documentation

## 2022-01-27 DIAGNOSIS — G8929 Other chronic pain: Secondary | ICD-10-CM | POA: Insufficient documentation

## 2022-01-27 DIAGNOSIS — K219 Gastro-esophageal reflux disease without esophagitis: Secondary | ICD-10-CM | POA: Diagnosis not present

## 2022-01-27 DIAGNOSIS — R634 Abnormal weight loss: Secondary | ICD-10-CM | POA: Diagnosis not present

## 2022-01-27 DIAGNOSIS — I25810 Atherosclerosis of coronary artery bypass graft(s) without angina pectoris: Secondary | ICD-10-CM | POA: Diagnosis not present

## 2022-01-27 DIAGNOSIS — J439 Emphysema, unspecified: Secondary | ICD-10-CM | POA: Diagnosis not present

## 2022-01-27 DIAGNOSIS — J302 Other seasonal allergic rhinitis: Secondary | ICD-10-CM | POA: Diagnosis not present

## 2022-01-27 DIAGNOSIS — R6889 Other general symptoms and signs: Secondary | ICD-10-CM | POA: Insufficient documentation

## 2022-01-27 DIAGNOSIS — E782 Mixed hyperlipidemia: Secondary | ICD-10-CM | POA: Diagnosis not present

## 2022-01-27 DIAGNOSIS — R110 Nausea: Secondary | ICD-10-CM | POA: Insufficient documentation

## 2022-01-27 DIAGNOSIS — I10 Essential (primary) hypertension: Secondary | ICD-10-CM | POA: Diagnosis not present

## 2022-01-27 DIAGNOSIS — H409 Unspecified glaucoma: Secondary | ICD-10-CM | POA: Diagnosis not present

## 2022-02-11 DIAGNOSIS — I10 Essential (primary) hypertension: Secondary | ICD-10-CM | POA: Diagnosis not present

## 2022-02-11 DIAGNOSIS — R7301 Impaired fasting glucose: Secondary | ICD-10-CM | POA: Diagnosis not present

## 2022-02-16 DIAGNOSIS — R7303 Prediabetes: Secondary | ICD-10-CM | POA: Insufficient documentation

## 2022-02-17 DIAGNOSIS — F419 Anxiety disorder, unspecified: Secondary | ICD-10-CM | POA: Insufficient documentation

## 2022-03-23 DIAGNOSIS — R0989 Other specified symptoms and signs involving the circulatory and respiratory systems: Secondary | ICD-10-CM | POA: Insufficient documentation

## 2022-03-23 DIAGNOSIS — I951 Orthostatic hypotension: Secondary | ICD-10-CM | POA: Insufficient documentation

## 2022-03-23 DIAGNOSIS — R0902 Hypoxemia: Secondary | ICD-10-CM | POA: Insufficient documentation

## 2022-03-23 DIAGNOSIS — R42 Dizziness and giddiness: Secondary | ICD-10-CM | POA: Insufficient documentation

## 2022-03-31 ENCOUNTER — Encounter (HOSPITAL_COMMUNITY): Payer: Medicare Other

## 2022-04-12 ENCOUNTER — Ambulatory Visit (INDEPENDENT_AMBULATORY_CARE_PROVIDER_SITE_OTHER): Payer: Medicare Other | Admitting: Pulmonary Disease

## 2022-04-12 ENCOUNTER — Encounter: Payer: Self-pay | Admitting: Pulmonary Disease

## 2022-04-12 VITALS — BP 134/70 | HR 70 | Ht 68.0 in

## 2022-04-12 DIAGNOSIS — J439 Emphysema, unspecified: Secondary | ICD-10-CM | POA: Diagnosis not present

## 2022-04-12 MED ORDER — ANORO ELLIPTA 62.5-25 MCG/ACT IN AEPB: 1.0000 | INHALATION_SPRAY | Freq: Every day | RESPIRATORY_TRACT | 5 refills | Status: DC

## 2022-04-12 NOTE — Patient Instructions (Addendum)
  Moderately severe COPD (FEV1 59%): GOLD Class B, mMRC <2 --CONTINUE Anoro ONE puff ONCE a day --CONTINUE montelukast 10 mg daily --CONTINUE Albuterol as needed for shortness of breath or wheezing --Ambulatory O2 performed in clinic. No O2 indicated  Follow-up with me in 6 months

## 2022-04-12 NOTE — Progress Notes (Signed)
Subjective:   PATIENT ID: Gregory Eaton GENDER: male DOB: 04/27/1946, MRN: [id number]   HPI  Chief Complaint  Patient presents with   Follow-up    Dizzy spell Declined flu vaccine    Reason for Visit: Follow-up   Gregory Eaton is a 76 year old male former smoker with COPD (FEV1 59%), status post CABG, hypertension, PVD and history of esophageal cancer status post esophagectomy and gastric pull-through in 2010 who presents for follow-up.  11/05/20 He reports his COPD is very well controlled. Denies any recent exacerbations requiring steroids or antibiotics. No ED/urgent care visit. Compliant with his Bevespi. He has used his albuterol twice since our last visit six months ago. He still has shortness of breath when walking uphill but feels that his legs have decreased circulation that may be contributing. Denies cough and wheezing.  09/03/21 Since our last visit, he reports his COPD has worsened but unsure if this is related to his reflux or heart. Denies coughing or wheezing. Has chest tightness. He is able to work in the yard and lift heavy loads without issue. No limitations in activity. He has been trying to see his Cardiologist. He has not been taking Bevespi that will cause chest discomfort so he is only taking it 1-2 days a week. Has not used his rescue inhaler.  He underwent a left femoral to below-knee popliteal arterial bypass on 05/11/2021. No issues except for needing a course of antibiotics post-procedure for soft tissue infection. More recently, He was seen in the ED on 08/07/2021.  Notes were reviewed.  He presented with chest pain and shortness of breath for 1 week.  D-dimer was elevated so was sent to the ED for further work-up.  CTA was negative for PE  10/14/21 Since our last visit, he failed Bevespi and was started on Anoro. Tolerating inhalers. Able to perform yardwork without limitation. He is walking 3-4 blocks daily but notices substernal chest pain with moderate  exertion. He is scheduled to see Cardiology on 10/23/21 as he is concerned about heart blockage. Denies shortness of breath, cough or wheezing.  04/12/22 Since our last visit he reports his symptoms have been stable. Sometimes he has a congested cough that will clear with coughing. However he checked his O2 measurement and it read 81%. Denies shortness of breath or wheezing.  No exacerbations in the last 6 months.  Social History: 60-pack-year smoking history. Quit in 1997 Previous employed as a Administrator, previously did Dealer work  Programme researcher, broadcasting/film/video exposures: Hx of Architect x 2 years  Past Medical History:  Diagnosis Date   Adenomatous colon polyp 03/2009   Last colonoscopy by Dr. [name]    Adenomatous polyp 2010   Adenomatous polyp of colon 11/03/2010   Arthritis    Barrett's esophagus    CAD (coronary artery disease)    Complication of anesthesia    has a shortened esophogus due to CA.    COPD (chronic obstructive pulmonary disease) (HCC)    Severe emphysema per CT   Diverticulosis    Emphysema of lung (HCC)    Esophageal carcinoma ([place]) 03/2009   T1N1M0   GERD (gastroesophageal reflux disease)    Glaucoma    History of Doppler ultrasound 11/09/2011   03/2014- 50-69% L ICA stenosis;carotid doppler; L bulb/prox ICA 0-49% diameter reduction; L vertebral artery - occlusive ds; L ECA  demonstrates severe amount of fibrous plaque   History of Doppler ultrasound 11/09/2011   LEAs; R ABI - mod art. insuff.; L  ABI normal at rest; R SFA - occlusive ds; L SFA - occlusive ds; patent fem-pop graft   History of echocardiogram 08/27/2009   EF >55%   History of hiatal hernia    History of kidney stones 1965   History of nuclear stress test 11/24/2011   lexiscan; normal perfusion; low risk scan; non-diagnostic for ischemia   Hyperlipidemia    Hypertension    Left carotid artery stenosis 04/08/2014   Pneumonia    Pre-diabetes    Pulmonary nodule, right 04/08/2014   2.8  mm-incidental finding on CT   PVD (peripheral vascular disease) ([place])    Tachyarrhythmia 1999   Status post ablation at [facility]   Tobacco abuse     Outpatient Medications Prior to Visit  Medication Sig Dispense Refill   acetaminophen (TYLENOL) 500 MG tablet Take 1,000 mg by mouth daily as needed for headache.     albuterol (PROVENTIL) (2.5 MG/3ML) 0.083% nebulizer solution Take 3 mLs (2.5 mg total) by nebulization every 6 (six) hours as needed for wheezing or shortness of breath. DX: J44.9 75 mL 6   albuterol (VENTOLIN HFA) 108 (90 Base) MCG/ACT inhaler Inhale 2 puffs into the lungs every 6 (six) hours as needed for wheezing or shortness of breath.     ALPRAZolam (XANAX) 0.5 MG tablet Take 1 tablet (0.5 mg total) by mouth at bedtime. 30 tablet 0   amLODipine (NORVASC) 10 MG tablet TAKE ONE (1) TABLET BY MOUTH EVERY DAY 90 tablet 3   aspirin EC 81 MG tablet Take 1 tablet (81 mg total) by mouth daily.     baclofen (LIORESAL) 10 MG tablet Take 1 tablet (10 mg total) by mouth 3 (three) times daily as needed for muscle spasms. 20 each 0   clopidogrel (PLAVIX) 75 MG tablet TAKE ONE (1) TABLET BY MOUTH EVERY DAY 90 tablet 0   gabapentin (NEURONTIN) 100 MG capsule Take 100 mg by mouth 3 (three) times daily as needed for pain.     guaiFENesin (MUCINEX) 600 MG 12 hr tablet Take 600 mg by mouth every 12 (twelve) hours as needed (cough).     lansoprazole (PREVACID) 30 MG capsule Take 1 capsule (30 mg total) by mouth daily before supper. 90 capsule 3   latanoprost (XALATAN) 0.005 % ophthalmic solution Place 1 drop into both eyes at bedtime.      losartan (COZAAR) 100 MG tablet TAKE ONE TABLET BY MOUTH EVERY NIGHT AT BEDTIME 90 tablet 3   metoprolol succinate (TOPROL-XL) 50 MG 24 hr tablet TAKE ONE (1) TABLET BY MOUTH EVERY DAY 90 tablet 1   montelukast (SINGULAIR) 10 MG tablet Take 1 tablet (10 mg total) by mouth daily. 90 tablet 4   Multiple Vitamins-Minerals (CENTRUM SILVER PO) Take 1 tablet by mouth  daily.     nitroGLYCERIN (NITROSTAT) 0.4 MG SL tablet Place 1 tablet (0.4 mg total) under the tongue every 5 (five) minutes as needed for chest pain. 25 tablet 0   rosuvastatin (CRESTOR) 20 MG tablet Take 1 tablet (20 mg total) by mouth daily. 30 tablet 3   sucralfate (CARAFATE) 1 g tablet Take 1 tablet (1 g total) by mouth 4 (four) times daily -  with meals and at bedtime. As needed for esophageal pain, heartburn. You can crush in couple of tablespoons of water to make suspension. 30 tablet 0   timolol (TIMOPTIC) 0.5 % ophthalmic solution Place 1 drop into both eyes in the morning and at bedtime.     umeclidinium-vilanterol [facility]  ELLIPTA) 62.5-25 MCG/ACT AEPB Inhale 1 puff into the lungs daily. 60 each 5   furosemide (LASIX) 40 MG tablet Take 40 mg by mouth daily as needed for fluid. (Patient not taking: Reported on 04/12/2022)     No facility-administered medications prior to visit.    Review of Systems  Constitutional:  Negative for chills, diaphoresis, fever, malaise/fatigue and weight loss.  HENT:  Positive for congestion.   Respiratory:  Positive for cough. Negative for hemoptysis, sputum production, shortness of breath and wheezing.   Cardiovascular:  Negative for chest pain, palpitations and leg swelling.     Objective:   Vitals:   04/12/22 1054  BP: 134/70  Pulse: 70  SpO2: 97%  Height: '5\' 8"'$  (1.727 m)  SpO2: 97 % O2 Device: None (Room air)  Physical Exam: General: Well-appearing, no acute distress HENT: [place], AT Eyes: EOMI, no scleral icterus Respiratory: Clear to auscultation bilaterally.  No crackles, wheezing or rales Cardiovascular: RRR, -M/R/G, no JVD Extremities:-Edema,-tenderness Neuro: AAO x4, CNII-XII grossly intact Psych: Normal mood, normal affect  Data Reviewed:  Imaging: CT Chest 11/22/17 - Centrilobular and paraseptal emphysema. 54m nodule in the RLL, unchanged since 2015 CTA 08/15/2021-no pulmonary embolism.  Stable right lower lobe 8 mm nodule.   Emphysema.  PFT: 03/23/2016 FVC 4.1 L (101 %) FEV1 1.8 (59 %) Ratio 58  Interpretation: Moderately severe obstructive defect.  Normal vital capacity.  10/14/21 FVC 3.50 (87%) FEV1 1.86 (64%) Ratio 52  TLC 109% RV 137% DLCO 50%. Borderline but not significant bronchodilator response Interpretation: Moderate obstructive lung defect with air trapping and moderately reduced gas exchange consistent with emphysema. Improved FEV1 with bronchodilators     Assessment & Plan:   Discussion: 76year old male with moderately severe COPD, hx of esophageal CA s/p esophagectomy, hx CABG and PVD on DAPT who presents for follow-up. Stable symptoms on Anoro. No exacerbations in >6 months. Ambulatory O2 performed in clinic with no desaturations. Discussed clinical course and management of COPD including bronchodilator regimen and action plan for exacerbation.  Failed Bevespi - ineffective  Moderately severe COPD (FEV1 59%): GOLD Class B, mMRC <2 --CONTINUE Anoro ONE puff ONCE a day --CONTINUE montelukast 10 mg daily --CONTINUE Albuterol as needed for shortness of breath or wheezing --Ambulatory O2 performed in clinic. No O2 indicated --Discussed vaccinations including influenza, RSV, covid and pneumococcal. Patient will receive with his PCP  RLL lung nodule: Stable 863mnodule, unchanged since 2015 No further imaging indicated   Health Maintenance Immunization History  Administered Date(s) Administered   Fluad Quad(high Dose 65+) 04/23/2019, 04/20/2021   Influenza Inj Mdck Quad Pf 04/17/2016   Influenza Split 05/29/2010   Influenza, High Dose Seasonal PF 04/19/2015, 04/17/2016, 05/09/2017, 04/21/2018, 04/19/2019   Influenza, Quadrivalent, Recombinant, Inj, Pf 04/17/2016, 02/17/2020   Influenza,inj,Quad PF,6+ Mos 04/19/2015, 04/17/2016   Influenza,inj,quad, With Preservative 02/17/2020   Influenza,trivalent, recombinat, inj, PF 05/29/2010   Influenza-Unspecified 05/29/2010, 05/01/2014   Moderna  Sars-Covid-2 Vaccination 08/23/2019, 09/21/2019, 05/16/2020, 07/09/2020, 12/26/2020   Pneumococcal Conjugate-13 04/01/2014, 04/01/2016   Pneumococcal Polysaccharide-23 04/17/2016   Td 05/17/2012   CT Lung Screen - Not qualified  No orders of the defined types were placed in this encounter.  Meds ordered this encounter  Medications   umeclidinium-vilanterol (ANORO ELLIPTA) 62.5-25 MCG/ACT AEPB    Sig: Inhale 1 puff into the lungs daily.    Dispense:  60 each    Refill:  5   Return in about 6 months (around 10/11/2022).   I have spent a  total time of 32-minutes on the day of the appointment including chart review, data review, collecting history, coordinating care and discussing medical diagnosis and plan with the patient/family. Past medical history, allergies, medications were reviewed. Pertinent imaging, labs and tests included in this note have been reviewed and interpreted independently by me.  [place], MD [facility] 04/12/2022 12:45 PM  Office Number [phone]

## 2022-04-22 ENCOUNTER — Encounter: Payer: Self-pay | Admitting: Internal Medicine

## 2022-04-22 ENCOUNTER — Encounter: Payer: Self-pay | Admitting: Cardiology

## 2022-04-22 ENCOUNTER — Ambulatory Visit: Payer: Medicare Other | Attending: Cardiology

## 2022-04-22 ENCOUNTER — Ambulatory Visit: Payer: Medicare Other | Attending: Cardiology | Admitting: Cardiology

## 2022-04-22 ENCOUNTER — Other Ambulatory Visit: Payer: Self-pay | Admitting: Cardiology

## 2022-04-22 VITALS — BP 150/80 | HR 64 | Ht 68.0 in | Wt 152.8 lb

## 2022-04-22 DIAGNOSIS — R002 Palpitations: Secondary | ICD-10-CM | POA: Diagnosis not present

## 2022-04-22 DIAGNOSIS — I25118 Atherosclerotic heart disease of native coronary artery with other forms of angina pectoris: Secondary | ICD-10-CM

## 2022-04-22 DIAGNOSIS — E782 Mixed hyperlipidemia: Secondary | ICD-10-CM

## 2022-04-22 DIAGNOSIS — R42 Dizziness and giddiness: Secondary | ICD-10-CM

## 2022-04-22 DIAGNOSIS — I10 Essential (primary) hypertension: Secondary | ICD-10-CM

## 2022-04-22 MED ORDER — AMLODIPINE BESYLATE 5 MG PO TABS: 5.0000 mg | ORAL_TABLET | Freq: Every day | ORAL | 3 refills | Status: DC

## 2022-04-22 NOTE — Progress Notes (Signed)
Clinical Summary [name] is a 76 y.o.male seen today for follow up of the following medical problems. Previously followed by Dr Gregory Eaton   1.CAD - He is s/p CABG in 1998 with low-risk NST in 07/2013 - 01/2021 nuclear stress: mild apical inferior ischemia, low risk study - 12/2020 echo: LVEF 55-60%, no WMAs, grade Idd   - ER eval Jan 2023 with chest pain, thought to be GERD. Trops neg, EKG withour acute ischemic changesSymptoms improved on antacid   - no recent chest pain     2. Palpitations - episode 1 week ago felt like heart was racing.  - woke him up from sleep -episode last 15-20 minutes.  - no recurrent episodes.  - coffee x 2 cups, no sodas, rare tea, no energy drinks, no EtOH    3, Dizziness - per his report had low bp's with standing. - ongonig symptoms. Episode just yesterday. Was climbing a ladder. Suddenly felt lightheaded, nauseous. Sat down and rested.  - drinks 2-3 water bottles a day, 2 cups of coffee. Has prn lasix has not taken in months. Seldomly takes muscle releaxer, takes gabapentin few times a week.     3. Syncope - admission 12/2020 with syncope - thought to be due to hypovolemia/dehydration, was hypotesiven SBPs to 60s resolved with IVFs. Aldactone was tsopped - no recent symptoms     4. PAD - He is s/p fem-pop bypass in 1993 and known occluded right SFA.  - prior bilateral common iliac artery stenting, bilateral external iliac artery stenting. - 03/2021 right iliofemoral endarectomy - 04/2021 left femoral below knee pop bypass - on extended DAPT   5. HTN - home bp's 160s/70s,  - reports pcp made recent changes due to dizziness. Norvasc was lowered to '5mg'$  daily.    6. Hyperliidemia - 10/2020 TC 90 TG 94 HDL 41 LDL 31   7. Severe COPD - followed by pulmionary   8. History of esopaghael cancer     Past Medical History:  Diagnosis Date   Adenomatous colon polyp 03/2009   Last colonoscopy by Dr. Gala Eaton    Adenomatous polyp 2010    Adenomatous polyp of colon 11/03/2010   Arthritis    Barrett's esophagus    CAD (coronary artery disease)    Complication of anesthesia    has a shortened esophogus due to CA.    COPD (chronic obstructive pulmonary disease) (HCC)    Severe emphysema per CT   Diverticulosis    Emphysema of lung (HCC)    Esophageal carcinoma ([place]) 03/2009   T1N1M0   GERD (gastroesophageal reflux disease)    Glaucoma    History of Doppler ultrasound 11/09/2011   03/2014- 50-69% L ICA stenosis;carotid doppler; L bulb/prox ICA 0-49% diameter reduction; L vertebral artery - occlusive ds; L ECA  demonstrates severe amount of fibrous plaque   History of Doppler ultrasound 11/09/2011   LEAs; R ABI - mod art. insuff.; L ABI normal at rest; R SFA - occlusive ds; L SFA - occlusive ds; patent fem-pop graft   History of echocardiogram 08/27/2009   EF >55%   History of hiatal hernia    History of kidney stones 1965   History of nuclear stress test 11/24/2011   lexiscan; normal perfusion; low risk scan; non-diagnostic for ischemia   Hyperlipidemia    Hypertension    Left carotid artery stenosis 04/08/2014   Pneumonia    Pre-diabetes    Pulmonary nodule, right 04/08/2014   2.8 mm-incidental  finding on CT   PVD (peripheral vascular disease) ([place])    Tachyarrhythmia 1999   Status post ablation at [facility]   Tobacco abuse      Allergies  Allergen Reactions   Altace [Ramipril] Cough     Current Outpatient Medications  Medication Sig Dispense Refill   acetaminophen (TYLENOL) 500 MG tablet Take 1,000 mg by mouth daily as needed for headache.     albuterol (PROVENTIL) (2.5 MG/3ML) 0.083% nebulizer solution Take 3 mLs (2.5 mg total) by nebulization every 6 (six) hours as needed for wheezing or shortness of breath. DX: J44.9 75 mL 6   albuterol (VENTOLIN HFA) 108 (90 Base) MCG/ACT inhaler Inhale 2 puffs into the lungs every 6 (six) hours as needed for wheezing or shortness of breath.     ALPRAZolam (XANAX) 0.5 MG  tablet Take 1 tablet (0.5 mg total) by mouth at bedtime. 30 tablet 0   amLODipine (NORVASC) 10 MG tablet TAKE ONE (1) TABLET BY MOUTH EVERY DAY 90 tablet 3   aspirin EC 81 MG tablet Take 1 tablet (81 mg total) by mouth daily.     baclofen (LIORESAL) 10 MG tablet Take 1 tablet (10 mg total) by mouth 3 (three) times daily as needed for muscle spasms. 20 each 0   clopidogrel (PLAVIX) 75 MG tablet TAKE ONE (1) TABLET BY MOUTH EVERY DAY 90 tablet 0   furosemide (LASIX) 40 MG tablet Take 40 mg by mouth daily as needed for fluid. (Patient not taking: Reported on 04/12/2022)     gabapentin (NEURONTIN) 100 MG capsule Take 100 mg by mouth 3 (three) times daily as needed for pain.     guaiFENesin (MUCINEX) 600 MG 12 hr tablet Take 600 mg by mouth every 12 (twelve) hours as needed (cough).     lansoprazole (PREVACID) 30 MG capsule Take 1 capsule (30 mg total) by mouth daily before supper. 90 capsule 3   latanoprost (XALATAN) 0.005 % ophthalmic solution Place 1 drop into both eyes at bedtime.      losartan (COZAAR) 100 MG tablet TAKE ONE TABLET BY MOUTH EVERY NIGHT AT BEDTIME 90 tablet 3   metoprolol succinate (TOPROL-XL) 50 MG 24 hr tablet TAKE ONE (1) TABLET BY MOUTH EVERY DAY 90 tablet 1   montelukast (SINGULAIR) 10 MG tablet Take 1 tablet (10 mg total) by mouth daily. 90 tablet 4   Multiple Vitamins-Minerals (CENTRUM SILVER PO) Take 1 tablet by mouth daily.     nitroGLYCERIN (NITROSTAT) 0.4 MG SL tablet Place 1 tablet (0.4 mg total) under the tongue every 5 (five) minutes as needed for chest pain. 25 tablet 0   rosuvastatin (CRESTOR) 20 MG tablet Take 1 tablet (20 mg total) by mouth daily. 30 tablet 3   sucralfate (CARAFATE) 1 g tablet Take 1 tablet (1 g total) by mouth 4 (four) times daily -  with meals and at bedtime. As needed for esophageal pain, heartburn. You can crush in couple of tablespoons of water to make suspension. 30 tablet 0   timolol (TIMOPTIC) 0.5 % ophthalmic solution Place 1 drop into both  eyes in the morning and at bedtime.     umeclidinium-vilanterol (ANORO ELLIPTA) 62.5-25 MCG/ACT AEPB Inhale 1 puff into the lungs daily. 60 each 5   No current facility-administered medications for this visit.     Past Surgical History:  Procedure Laterality Date   ABDOMINAL AORTOGRAM W/LOWER EXTREMITY N/A 04/01/2021   Procedure: ABDOMINAL AORTOGRAM W/LOWER EXTREMITY;  Surgeon: Gregory John, MD;  Location: [place] [facility];  Service: Cardiovascular;  Laterality: N/A;   BIOPSY  10/24/2017   Procedure: BIOPSY;  Surgeon: Gregory Dolin, MD;  [facility];  Service: Endoscopy;;  esophagus   BIOPSY  10/22/2021   Procedure: BIOPSY;  Surgeon: Gregory Dolin, MD;  [facility];  Service: Endoscopy;;   CATARACT EXTRACTION Bilateral    COLONOSCOPY  03/17/2009   Dr.Rourk- normal rectum, sigmoid diverticula, some pale sigmoid mucosa with diffuse petechiae. pedunculated polyp at the splenic flexure, remainder of colonic mucosa appeared normal. bx= adenomatous polyp   COLONOSCOPY  04/19/2003   Dr.Rehman- few diverticu;a at the sigmoid colon, 3 small polyps, one at the transverse colon and 2 at the sigmoid, small external hemorrhoids. bx report not available.    COLONOSCOPY WITH PROPOFOL N/A 10/24/2017   Dr. Gala Eaton: Diverticulosis, 11 mm polyp at the ileocecal valve, tubular adenoma.  Repeat colonoscopy in 3 years   COLONOSCOPY WITH PROPOFOL N/A 10/22/2021   Procedure: COLONOSCOPY WITH PROPOFOL;  Surgeon: Gregory Dolin, MD;  [facility];  Service: Endoscopy;  Laterality: N/A;  11:00am   CORONARY ARTERY BYPASS GRAFT  1998   Gregory Eaton   ENDARTERECTOMY FEMORAL Right 04/13/2021   Procedure: RIGHT ILIOFEMORAL ARTERY ENDARTERECTOMY WITH PATCH ANGIOPLASTY USING HEMASHIELD PALTINUM FINESSE DACRON PATCH;  Surgeon: Gregory John, MD;  Location: [place];  Service: Vascular;  Laterality: Right;   ESOPHAGECTOMY  2010   [facility] Dr. Carlyle Eaton   ESOPHAGOGASTRODUODENOSCOPY   11/25/2010   Dr.Rourk- s/p esophagectomy with gastric pull-up, esophageal erosions straddling the surgical anastomosis, salmon colored epithelium coming up a good centimeter to a centimeter and a half above the suture line, islands of salmon colored epithelium in the most poximal residual esophagus, remainder of gastric mucosa appeared normal. bx= swamous &gastric glandular mucosa w/chronic active inflammation   ESOPHAGOGASTRODUODENOSCOPY  03/17/2009   Dr.Rourk- 4cm segment of salmon-colored epithlium distal esophagus suspicious for barretts esophagus. area of suspicious nodularity w/in this segment bx seperately. small to moderate size hiatal hernia, o/w normal stomach D1 and D2 bx=adenocarcinoma   ESOPHAGOGASTRODUODENOSCOPY (EGD) WITH PROPOFOL N/A 10/24/2017   Dr. Gala Eaton: Remnant of esophagus with anastomosis with stomach at 24 cm from the incisors, 2 cm above the anastomosis he was found to have Barrett's but no dysplasia.  Advised for repeat EGD in 3 years   ESOPHAGOGASTRODUODENOSCOPY (EGD) WITH PROPOFOL N/A 10/22/2021   Procedure: ESOPHAGOGASTRODUODENOSCOPY (EGD) WITH PROPOFOL;  Surgeon: Gregory Dolin, MD;  [facility];  Service: Endoscopy;  Laterality: N/A;   EYE SURGERY     cataract   FEMORAL-POPLITEAL BYPASS GRAFT  1993   occlusive ds in R SFA   FEMORAL-POPLITEAL BYPASS GRAFT Left 05/11/2021   Procedure: LEFT FEMORAL-BELOW KNEE POPLITEAL ARTERY BYPASS GRAFT WITH 6 mm PTFE and COMPLETION ANGIOGRAM;  Surgeon: Gregory John, MD;  Location: [place];  Service: Vascular;  Laterality: Left;   GASTRIC PULL THROUGH  2010   With esophagectomy   INSERTION OF ILIAC STENT Bilateral 04/13/2021   Procedure: INSERTION OF BILATERAL COMMON ILIAC KISSING STENTS AND INSERION OF RIGHT EXTERNAL ILIAC STENT;  Surgeon: Gregory John, MD;  Location[facility];  Service: Vascular;  Laterality: Bilateral;   JOINT REPLACEMENT     LIPOMA EXCISION  2010   LOWER EXTREMITY ANGIOGRAM Left 04/13/2021    Procedure: LEFT LOWER EXTREMITY ANGIOGRAM ;  Surgeon: Gregory John, MD;  Location: [place];  Service: Vascular;  Laterality: Left;   PERIPHERAL VASCULAR INTERVENTION  04/01/2021   Procedure: PERIPHERAL VASCULAR INTERVENTION;  Surgeon: Gregory John, MD;  Location: [place] [facility];  Service: Cardiovascular;;   POLYPECTOMY  10/24/2017   Procedure: POLYPECTOMY;  Surgeon: Gregory Dolin, MD;  [facility];  Service: Endoscopy;;  colon    POLYPECTOMY  10/22/2021   Procedure: POLYPECTOMY;  Surgeon: Gregory Dolin, MD;  [facility];  Service: Endoscopy;;   TOTAL HIP ARTHROPLASTY Left 01/30/2019   Procedure: TOTAL HIP ARTHROPLASTY ANTERIOR APPROACH;  Surgeon: Gregory Butters, MD;  Location: [facility];  Service: Orthopedics;  Laterality: Left;   ULTRASOUND GUIDANCE FOR VASCULAR ACCESS Left 04/13/2021   Procedure: ULTRASOUND GUIDANCE FOR VASCULAR ACCESS, left femoral;  Surgeon: Gregory John, MD;  Location: [place];  Service: Vascular;  Laterality: Left;     Allergies  Allergen Reactions   Altace [Ramipril] Cough      Family History  Problem Relation Age of Onset   GER disease Mother    Coronary artery disease Brother    Congenital heart disease Sister    Colon cancer Neg Hx      Social History [name] reports that he quit smoking about 26 years ago. His smoking use included cigarettes. He started smoking about 66 years ago. He has a 80.00 pack-year smoking history. He has never used smokeless tobacco. [name] reports no history of alcohol use.   Review of Systems CONSTITUTIONAL: No weight loss, fever, chills, weakness or fatigue.  HEENT: Eyes: No visual loss, blurred vision, double vision or yellow sclerae.No hearing loss, sneezing, congestion, runny nose or sore throat.  SKIN: No rash or itching.  CARDIOVASCULAR: per hpi RESPIRATORY: No shortness of breath, cough or sputum.  GASTROINTESTINAL: No anorexia, nausea, vomiting or diarrhea. No abdominal  pain or blood.  GENITOURINARY: No burning on urination, no polyuria NEUROLOGICAL: No headache, dizziness, syncope, paralysis, ataxia, numbness or tingling in the extremities. No change in bowel or bladder control.  MUSCULOSKELETAL: No muscle, back pain, joint pain or stiffness.  LYMPHATICS: No enlarged nodes. No history of splenectomy.  PSYCHIATRIC: No history of depression or anxiety.  ENDOCRINOLOGIC: No reports of sweating, cold or heat intolerance. No polyuria or polydipsia.  Marland Kitchen   Physical Examination Today's Vitals   04/22/22 1108 04/22/22 1146  BP: (!) 168/80 (!) 150/80  Pulse: 64   Weight: 152 lb 12.8 oz (69.3 kg)   Height: '5\' 8"'$  (1.727 m)    Body mass index is 23.23 kg/m.  Gen: resting comfortably, no acute distress HEENT: no scleral icterus, pupils equal round and reactive, no palptable cervical adenopathy,  CV: RRR, no m/r/g no jvd Resp: Clear to auscultation bilaterally GI: abdomen is soft, non-tender, non-distended, normal bowel sounds, no hepatosplenomegaly MSK: extremities are warm, no edema.  Skin: warm, no rash Neuro:  no focal deficits Psych: appropriate affect   Diagnostic Studies  01/2021 nuclear stress The left ventricular ejection fraction is normal (55-65%). Nuclear stress EF: 65%. There was no ST segment deviation noted during stress. No T wave inversion was noted during stress. Defect 1: There is a small defect of mild severity present in the apical inferior location. Findings consistent with a small region of ischemia. This is a low risk study.   Assessment and Plan   CAD  - 01/2021 nuclear stress was low risk, though very small area of ischemia - no recent symptoms   2. Hyperlipidemia - request pcp labs, continue current meds   3. HTN - elevated today. Per patient  pcp had lowered norvasc to '5mg'$  daily due to orthostatic hypotension. Request pcp notes, manual recheck 150/80. No changes for now until we sort out his dizziness further   4.  Palpitations _ EKG today shows SR, RBBB -plan for 14 day monitor  5. Dizziness - monitor as reported above - orthostatic today are negative - encouraged increased hydration       Arnoldo Lenis, M.D.

## 2022-04-22 NOTE — Patient Instructions (Addendum)
Medication Instructions:  Your physician recommends that you continue on your current medications as directed. Please refer to the Current Medication list given to you today.   Labwork: None  Testing/Procedures: 14 day Zio patch  Follow-Up: Follow up with Dr. Harl Bowie in 4 months.   Any Other Special Instructions Will Be Listed Below (If Applicable).     If you need a refill on your cardiac medications before your next appointment, please call your pharmacy.   ZIO XT- Long Term Monitor Instructions   Your physician has requested you wear your ZIO patch monitor___14____days.   This is a single patch monitor.  [employer] supplies one patch monitor per enrollment.  Additional stickers are not available.   Please do not apply patch if you will be having a Nuclear Stress Test, Echocardiogram, Cardiac CT, MRI, or Chest Xray during the time frame you would be wearing the monitor. The patch cannot be worn during these tests.  You cannot remove and re-apply the ZIO XT patch monitor.   Your ZIO patch monitor will be sent USPS Priority mail from [facility] directly to your home address. The monitor may also be mailed to a PO BOX if home delivery is not available.   It may take 3-5 days to receive your monitor after you have been enrolled.   Once you have received you monitor, please review enclosed instructions.  Your monitor has already been registered assigning a specific monitor serial # to you.   Applying the monitor   Shave hair from upper left chest.   Hold abrader disc by orange tab.  Rub abrader in 40 strokes over left upper chest as indicated in your monitor instructions.   Clean area with 4 enclosed alcohol pads .  Use all pads to assure are is cleaned thoroughly.  Let dry.   Apply patch as indicated in monitor instructions.  Patch will be place under collarbone on left side of chest with arrow pointing upward.   Rub patch adhesive wings for 2 minutes.Remove white label  marked "1".  Remove white label marked "2".  Rub patch adhesive wings for 2 additional minutes.   While looking in a mirror, press and release button in center of patch.  A small green light will flash 3-4 times .  This will be your only indicator the monitor has been turned on.     Do not shower for the first 24 hours.  You may shower after the first 24 hours.   Press button if you feel a symptom. You will hear a small click.  Record Date, Time and Symptom in the Patient Log Book.   When you are ready to remove patch, follow instructions on last 2 pages of Patient Log Book.  Stick patch monitor onto last page of Patient Log Book.   Place Patient Log Book in [place] box.  Use locking tab on box and tape box closed securely.  The [employer] has [employer] on it.  Please place in mailbox as soon as possible.  Your physician should have your test results approximately 7 days after the monitor has been mailed back to [facility].   Call [place] at [phone] if you have questions regarding your ZIO XT patch monitor.  Call them immediately if you see an orange light blinking on your monitor.   If your monitor falls off in less than 4 days contact our Monitor department at [phone].  If your monitor becomes loose or falls  off after 4 days call [employer] at [phone] for suggestions on securing your monitor.

## 2022-07-21 DIAGNOSIS — R030 Elevated blood-pressure reading, without diagnosis of hypertension: Secondary | ICD-10-CM | POA: Diagnosis not present

## 2022-07-21 DIAGNOSIS — J069 Acute upper respiratory infection, unspecified: Secondary | ICD-10-CM | POA: Diagnosis not present

## 2022-07-22 ENCOUNTER — Encounter (HOSPITAL_BASED_OUTPATIENT_CLINIC_OR_DEPARTMENT_OTHER): Payer: Self-pay | Admitting: Pulmonary Disease

## 2022-07-22 ENCOUNTER — Ambulatory Visit (INDEPENDENT_AMBULATORY_CARE_PROVIDER_SITE_OTHER): Payer: Medicare Other | Admitting: Pulmonary Disease

## 2022-07-22 ENCOUNTER — Ambulatory Visit (INDEPENDENT_AMBULATORY_CARE_PROVIDER_SITE_OTHER): Payer: Medicare Other

## 2022-07-22 VITALS — BP 148/64 | HR 80 | Ht 68.0 in | Wt 157.0 lb

## 2022-07-22 DIAGNOSIS — J449 Chronic obstructive pulmonary disease, unspecified: Secondary | ICD-10-CM

## 2022-07-22 DIAGNOSIS — J439 Emphysema, unspecified: Secondary | ICD-10-CM | POA: Diagnosis not present

## 2022-07-22 DIAGNOSIS — R059 Cough, unspecified: Secondary | ICD-10-CM

## 2022-07-22 MED ORDER — ANORO ELLIPTA 62.5-25 MCG/ACT IN AEPB: 1.0000 | INHALATION_SPRAY | Freq: Every day | RESPIRATORY_TRACT | 5 refills | Status: DC

## 2022-07-22 MED ORDER — MONTELUKAST SODIUM 10 MG PO TABS: 10.0000 mg | ORAL_TABLET | Freq: Every day | ORAL | 3 refills | Status: DC

## 2022-07-22 MED ORDER — PREDNISONE 10 MG PO TABS: 40.0000 mg | ORAL_TABLET | Freq: Every day | ORAL | 0 refills | Status: AC

## 2022-07-22 NOTE — Progress Notes (Signed)
Subjective:   PATIENT ID: Gregory Eaton GENDER: male DOB: Mar 16, 1946, MRN: [id number]   HPI  Chief Complaint  Patient presents with   Follow-up    Had flu 07/13/22 and thinks it's turned into pneumonia     Reason for Visit: Follow-up   [name] is a 77 year old male former smoker with COPD (FEV1 59%), status post CABG, hypertension, PVD and history of esophageal cancer status post esophagectomy and gastric pull-through in 2010 who presents for follow-up.  11/05/20 He reports his COPD is very well controlled. Denies any recent exacerbations requiring steroids or antibiotics. No ED/urgent care visit. Compliant with his Bevespi. He has used his albuterol twice since our last visit six months ago. He still has shortness of breath when walking uphill but feels that his legs have decreased circulation that may be contributing. Denies cough and wheezing.  09/03/21 Since our last visit, he reports his COPD has worsened but unsure if this is related to his reflux or heart. Denies coughing or wheezing. Has chest tightness. He is able to work in the yard and lift heavy loads without issue. No limitations in activity. He has been trying to see his Cardiologist. He has not been taking Bevespi that will cause chest discomfort so he is only taking it 1-2 days a week. Has not used his rescue inhaler.  He underwent a left femoral to below-knee popliteal arterial bypass on 05/11/2021. No issues except for needing a course of antibiotics post-procedure for soft tissue infection. More recently, He was seen in the ED on 08/07/2021.  Notes were reviewed.  He presented with chest pain and shortness of breath for 1 week.  D-dimer was elevated so was sent to the ED for further work-up.  CTA was negative for PE  10/14/21 Since our last visit, he failed Bevespi and was started on Anoro. Tolerating inhalers. Able to perform yardwork without limitation. He is walking 3-4 blocks daily but notices substernal  chest pain with moderate exertion. He is scheduled to see Cardiology on 10/23/21 as he is concerned about heart blockage. Denies shortness of breath, cough or wheezing.  04/12/22 Since our last visit he reports his symptoms have been stable. Sometimes he has a congested cough that will clear with coughing. However he checked his O2 measurement and it read 81%. Denies shortness of breath or wheezing.  No exacerbations in the last 6 months.  07/22/22 Since our last visit he reports recent flu which was treated with tamiflu and azithromycin. He continues to cough, shortness of breath, wheezing and fatigue with low grade fevers.   Social History: 60-pack-year smoking history. Quit in 1997 Previous employed as a Administrator, previously did Dealer work  Programme researcher, broadcasting/film/video exposures: Hx of Architect x 2 years  Past Medical History:  Diagnosis Date   Adenomatous colon polyp 03/2009   Last colonoscopy by Dr. [name]    Adenomatous polyp 2010   Adenomatous polyp of colon 11/03/2010   Arthritis    Barrett's esophagus    CAD (coronary artery disease)    Complication of anesthesia    has a shortened esophogus due to CA.    COPD (chronic obstructive pulmonary disease) (HCC)    Severe emphysema per CT   Diverticulosis    Emphysema of lung (HCC)    Esophageal carcinoma ([place]) 03/2009   T1N1M0   GERD (gastroesophageal reflux disease)    Glaucoma    History of Doppler ultrasound 11/09/2011   03/2014- 50-69% L ICA stenosis;carotid doppler; L  bulb/prox ICA 0-49% diameter reduction; L vertebral artery - occlusive ds; L ECA  demonstrates severe amount of fibrous plaque   History of Doppler ultrasound 11/09/2011   LEAs; R ABI - mod art. insuff.; L ABI normal at rest; R SFA - occlusive ds; L SFA - occlusive ds; patent fem-pop graft   History of echocardiogram 08/27/2009   EF >55%   History of hiatal hernia    History of kidney stones 1965   History of nuclear stress test 11/24/2011   lexiscan; normal  perfusion; low risk scan; non-diagnostic for ischemia   Hyperlipidemia    Hypertension    Left carotid artery stenosis 04/08/2014   Pneumonia    Pre-diabetes    Pulmonary nodule, right 04/08/2014   2.8 mm-incidental finding on CT   PVD (peripheral vascular disease) ([place])    Tachyarrhythmia 1999   Status post ablation at [facility]   Tobacco abuse     Outpatient Medications Prior to Visit  Medication Sig Dispense Refill   acetaminophen (TYLENOL) 500 MG tablet Take 1,000 mg by mouth daily as needed for headache.     albuterol (PROVENTIL) (2.5 MG/3ML) 0.083% nebulizer solution Take 3 mLs (2.5 mg total) by nebulization every 6 (six) hours as needed for wheezing or shortness of breath. DX: J44.9 75 mL 6   albuterol (VENTOLIN HFA) 108 (90 Base) MCG/ACT inhaler Inhale 2 puffs into the lungs every 6 (six) hours as needed for wheezing or shortness of breath.     ALPRAZolam (XANAX) 0.5 MG tablet Take 1 tablet (0.5 mg total) by mouth at bedtime. 30 tablet 0   amLODipine (NORVASC) 5 MG tablet Take 1 tablet (5 mg total) by mouth daily. 90 tablet 3   aspirin EC 81 MG tablet Take 1 tablet (81 mg total) by mouth daily.     baclofen (LIORESAL) 10 MG tablet Take 1 tablet (10 mg total) by mouth 3 (three) times daily as needed for muscle spasms. 20 each 0   clopidogrel (PLAVIX) 75 MG tablet TAKE ONE (1) TABLET BY MOUTH EVERY DAY 90 tablet 0   furosemide (LASIX) 40 MG tablet Take 40 mg by mouth daily as needed for fluid.     gabapentin (NEURONTIN) 100 MG capsule Take 100 mg by mouth 3 (three) times daily as needed for pain.     guaiFENesin (MUCINEX) 600 MG 12 hr tablet Take 600 mg by mouth every 12 (twelve) hours as needed (cough).     lansoprazole (PREVACID) 30 MG capsule Take 1 capsule (30 mg total) by mouth daily before supper. 90 capsule 3   latanoprost (XALATAN) 0.005 % ophthalmic solution Place 1 drop into both eyes at bedtime.      losartan (COZAAR) 100 MG tablet TAKE ONE TABLET BY MOUTH EVERY NIGHT AT  BEDTIME 90 tablet 3   metoprolol succinate (TOPROL-XL) 50 MG 24 hr tablet TAKE ONE (1) TABLET BY MOUTH EVERY DAY 90 tablet 1   Multiple Vitamins-Minerals (CENTRUM SILVER PO) Take 1 tablet by mouth daily.     nitroGLYCERIN (NITROSTAT) 0.4 MG SL tablet Place 1 tablet (0.4 mg total) under the tongue every 5 (five) minutes as needed for chest pain. 25 tablet 0   rosuvastatin (CRESTOR) 20 MG tablet Take 1 tablet (20 mg total) by mouth daily. 30 tablet 3   sucralfate (CARAFATE) 1 g tablet Take 1 tablet (1 g total) by mouth 4 (four) times daily -  with meals and at bedtime. As needed for esophageal pain, heartburn. You can crush  in couple of tablespoons of water to make suspension. 30 tablet 0   timolol (TIMOPTIC) 0.5 % ophthalmic solution Place 1 drop into both eyes in the morning and at bedtime.     montelukast (SINGULAIR) 10 MG tablet Take 1 tablet (10 mg total) by mouth daily. 90 tablet 4   umeclidinium-vilanterol (ANORO ELLIPTA) 62.5-25 MCG/ACT AEPB Inhale 1 puff into the lungs daily. 60 each 5   No facility-administered medications prior to visit.    Review of Systems  Constitutional:  Negative for chills, diaphoresis, fever, malaise/fatigue and weight loss.  HENT:  Positive for congestion.   Respiratory:  Positive for cough. Negative for hemoptysis, sputum production, shortness of breath and wheezing.   Cardiovascular:  Negative for chest pain, palpitations and leg swelling.     Objective:   Vitals:   07/22/22 1317  BP: (!) 148/64  Pulse: 80  SpO2: 94%  Weight: 157 lb (71.2 kg)  Height: '5\' 8"'$  (1.727 m)  SpO2: 94 % O2 Device: None (Room air)  Physical Exam: General: Well-appearing, no acute distress HENT: [place], AT Eyes: EOMI, no scleral icterus Respiratory: Clear to auscultation bilaterally.  No crackles, wheezing or rales Cardiovascular: RRR, -M/R/G, no JVD Extremities:-Edema,-tenderness Neuro: AAO x4, CNII-XII grossly intact Psych: Normal mood, normal affect  Data  Reviewed:  Imaging: CT Chest 11/22/17 - Centrilobular and paraseptal emphysema. 42m nodule in the RLL, unchanged since 2015 CTA 08/15/2021-no pulmonary embolism.  Stable right lower lobe 8 mm nodule.  Emphysema. CXR 07/22/22 - Infrahilar infiltrate on lateral view  PFT: 03/23/2016 FVC 4.1 L (101 %) FEV1 1.8 (59 %) Ratio 58  Interpretation: Moderately severe obstructive defect.  Normal vital capacity.  10/14/21 FVC 3.50 (87%) FEV1 1.86 (64%) Ratio 52  TLC 109% RV 137% DLCO 50%. Borderline but not significant bronchodilator response Interpretation: Moderate obstructive lung defect with air trapping and moderately reduced gas exchange consistent with emphysema. Improved FEV1 with bronchodilators     Assessment & Plan:   Discussion: [age]year old male with moderately severe COPD, hx esophageal CA s/p esophagectomy, hx CABG and PVD on DAPT who presents for follow-up. Recent influenza with persistent symptoms. CXR initially read neg pneumonia however on final read with infrahilar infiltrate on lateral view. Patient contacted via voicemail on 1/6 to offer antibiotics if symptoms not improved on steroids.  Failed Bevespi - ineffective  COPD exacerbation Moderately severe COPD (FEV1 59%): GOLD Class B, mMRC <2 --Prednisone 40 mg x 5 days. If still wheezing, will considering adding ICS inhaler --CONTINUE Anoro ONE puff ONCE a day. REFILL --CONTINUE montelukast 10 mg daily. REFILL --CONTINUE Albuterol as needed for shortness of breath or wheezing  RLL lung nodule: Stable 831mnodule, unchanged since 2015 No further imaging indicated   Health Maintenance Immunization History  Administered Date(s) Administered   Fluad Quad(high Dose 65+) 04/23/2019, 04/20/2021   Influenza Inj Mdck Quad Pf 04/17/2016   Influenza Split 05/29/2010   Influenza, High Dose Seasonal PF 04/19/2015, 04/17/2016, 05/09/2017, 04/21/2018, 04/19/2019   Influenza, Quadrivalent, Recombinant, Inj, Pf 04/17/2016, 02/17/2020    Influenza,inj,Quad PF,6+ Mos 04/19/2015, 04/17/2016   Influenza,inj,quad, With Preservative 02/17/2020   Influenza,trivalent, recombinat, inj, PF 05/29/2010   Influenza-Unspecified 05/29/2010, 05/01/2014   Moderna Sars-Covid-2 Vaccination 08/23/2019, 09/21/2019, 05/16/2020, 07/09/2020, 12/26/2020   Pneumococcal Conjugate-13 04/01/2014, 04/01/2016   Pneumococcal Polysaccharide-23 04/17/2016   Td 05/17/2012   CT Lung Screen - Not qualified  Orders Placed This Encounter  Procedures   DG Chest 2 View    Standing Status:   Future  Number of Occurrences:   1    Standing Expiration Date:   07/23/2023    Order Specific Question:   Reason for Exam (SYMPTOM  OR DIAGNOSIS REQUIRED)    Answer:   cough    Order Specific Question:   Preferred imaging location?    Answer:   Internal   Meds ordered this encounter  Medications   umeclidinium-vilanterol (ANORO ELLIPTA) 62.5-25 MCG/ACT AEPB    Sig: Inhale 1 puff into the lungs daily.    Dispense:  60 each    Refill:  5   montelukast (SINGULAIR) 10 MG tablet    Sig: Take 1 tablet (10 mg total) by mouth daily.    Dispense:  90 tablet    Refill:  3   predniSONE (DELTASONE) 10 MG tablet    Sig: Take 4 tablets (40 mg total) by mouth daily with breakfast for 5 days.    Dispense:  20 tablet    Refill:  0   Return in about 6 months (around 01/20/2023).   I have spent a total time of 35-minutes on the day of the appointment including chart review, data review, collecting history, coordinating care and discussing medical diagnosis and plan with the patient/family. Past medical history, allergies, medications were reviewed. Pertinent imaging, labs and tests included in this note have been reviewed and interpreted independently by me.  [place], MD [facility] 07/22/2022 2:12 PM  Office Number [phone]

## 2022-07-22 NOTE — Patient Instructions (Addendum)
COPD exacerbation Moderately severe COPD (FEV1 59%): GOLD Class B, mMRC <2 --Prednisone 40 mg x 5 days --CONTINUE Anoro ONE puff ONCE a day. REFILL --CONTINUE montelukast 10 mg daily. REFILL --CONTINUE Albuterol as needed for shortness of breath or wheezing  Follow-up with me in 6 months

## 2022-07-24 ENCOUNTER — Encounter (HOSPITAL_BASED_OUTPATIENT_CLINIC_OR_DEPARTMENT_OTHER): Payer: Self-pay | Admitting: Pulmonary Disease

## 2022-07-24 MED ORDER — AMOXICILLIN-POT CLAVULANATE 875-125 MG PO TABS: 1.0000 | ORAL_TABLET | Freq: Two times a day (BID) | ORAL | 0 refills | Status: DC

## 2022-07-26 ENCOUNTER — Telehealth: Payer: Self-pay | Admitting: Pulmonary Disease

## 2022-07-26 NOTE — Telephone Encounter (Signed)
Augmentin abx was sent to pharmacy for pt 1/6. Called and spoke with pt letting him know this was  done and he verbalized understanding. Nothing further needed.

## 2022-07-26 NOTE — Telephone Encounter (Signed)
Dr. Loanne Drilling called PT on Sat AM saying she would call in an Antibx for him if he'd like. Can you please see if it was sent in? If not please call it in to Pharm: [facility]. TY

## 2022-08-16 DIAGNOSIS — E782 Mixed hyperlipidemia: Secondary | ICD-10-CM | POA: Diagnosis not present

## 2022-08-16 DIAGNOSIS — R7303 Prediabetes: Secondary | ICD-10-CM | POA: Diagnosis not present

## 2022-08-16 DIAGNOSIS — I10 Essential (primary) hypertension: Secondary | ICD-10-CM | POA: Diagnosis not present

## 2022-08-19 DIAGNOSIS — E1165 Type 2 diabetes mellitus with hyperglycemia: Secondary | ICD-10-CM | POA: Insufficient documentation

## 2022-08-20 DIAGNOSIS — G8929 Other chronic pain: Secondary | ICD-10-CM | POA: Diagnosis not present

## 2022-08-20 DIAGNOSIS — J302 Other seasonal allergic rhinitis: Secondary | ICD-10-CM | POA: Diagnosis not present

## 2022-08-20 DIAGNOSIS — M549 Dorsalgia, unspecified: Secondary | ICD-10-CM | POA: Diagnosis not present

## 2022-08-20 DIAGNOSIS — I25810 Atherosclerosis of coronary artery bypass graft(s) without angina pectoris: Secondary | ICD-10-CM | POA: Diagnosis not present

## 2022-08-20 DIAGNOSIS — K219 Gastro-esophageal reflux disease without esophagitis: Secondary | ICD-10-CM | POA: Diagnosis not present

## 2022-08-20 DIAGNOSIS — I10 Essential (primary) hypertension: Secondary | ICD-10-CM | POA: Diagnosis not present

## 2022-08-20 DIAGNOSIS — J439 Emphysema, unspecified: Secondary | ICD-10-CM | POA: Diagnosis not present

## 2022-08-20 DIAGNOSIS — H409 Unspecified glaucoma: Secondary | ICD-10-CM | POA: Diagnosis not present

## 2022-08-20 DIAGNOSIS — E782 Mixed hyperlipidemia: Secondary | ICD-10-CM | POA: Diagnosis not present

## 2022-08-20 DIAGNOSIS — R110 Nausea: Secondary | ICD-10-CM | POA: Diagnosis not present

## 2022-08-27 ENCOUNTER — Other Ambulatory Visit: Payer: Self-pay

## 2022-08-27 ENCOUNTER — Ambulatory Visit: Payer: Medicare Other | Admitting: Physician Assistant

## 2022-08-27 ENCOUNTER — Ambulatory Visit (INDEPENDENT_AMBULATORY_CARE_PROVIDER_SITE_OTHER)
Admission: RE | Admit: 2022-08-27 | Discharge: 2022-08-27 | Disposition: A | Discharge status: Home / Self Care | Payer: Medicare Other | Source: Ambulatory Visit | Attending: Vascular Surgery | Admitting: Vascular Surgery

## 2022-08-27 ENCOUNTER — Ambulatory Visit (HOSPITAL_COMMUNITY)
Admission: RE | Admit: 2022-08-27 | Discharge: 2022-08-27 | Disposition: A | Discharge status: Home / Self Care | Payer: Medicare Other | Source: Ambulatory Visit | Attending: Vascular Surgery | Admitting: Vascular Surgery

## 2022-08-27 VITALS — BP 175/74 | HR 75 | Temp 97.5°F | Resp 20 | Ht 68.0 in | Wt 157.4 lb

## 2022-08-27 DIAGNOSIS — I739 Peripheral vascular disease, unspecified: Secondary | ICD-10-CM | POA: Diagnosis not present

## 2022-08-27 DIAGNOSIS — I6522 Occlusion and stenosis of left carotid artery: Secondary | ICD-10-CM | POA: Insufficient documentation

## 2022-08-27 DIAGNOSIS — Z95828 Presence of other vascular implants and grafts: Secondary | ICD-10-CM

## 2022-08-27 DIAGNOSIS — I70222 Atherosclerosis of native arteries of extremities with rest pain, left leg: Secondary | ICD-10-CM

## 2022-08-27 LAB — VAS US ABI WITH/WO TBI

## 2022-08-27 NOTE — Progress Notes (Signed)
Office Note     CC:  follow up Requesting Provider:  Celene Squibb, MD  HPI: Gregory Eaton is a 77 y.o. (July 28, 1945) male who presents for surveillance of PAD.  Surgical history significant for left femoral to below the knee popliteal bypass with ringed PTFE on 05/11/2021.  He has also had multiple interventions including right iliofemoral endarterectomy with bilateral common iliac stents and right external iliac stent on 04/11/2022.  He also had left external iliac artery stenting on 04/03/2022.  He denies any claudication, rest pain, or tissue loss.  He is on Plavix and statin daily.  He denies tobacco use.  He is also followed for carotid artery stenosis.  He does not have any strokelike symptoms including slurring speech, changes in vision, or one-sided weakness.  Past Medical History:  Diagnosis Date   Adenomatous colon polyp 03/2009   Last colonoscopy by Dr. [name]    Adenomatous polyp 2010   Adenomatous polyp of colon 11/03/2010   Arthritis    Barrett's esophagus    CAD (coronary artery disease)    Complication of anesthesia    has a shortened esophogus due to CA.    COPD (chronic obstructive pulmonary disease) (HCC)    Severe emphysema per CT   Diverticulosis    Emphysema of lung (HCC)    Esophageal carcinoma ([place]) 03/2009   T1N1M0   GERD (gastroesophageal reflux disease)    Glaucoma    History of Doppler ultrasound 11/09/2011   03/2014- 50-69% L ICA stenosis;carotid doppler; L bulb/prox ICA 0-49% diameter reduction; L vertebral artery - occlusive ds; L ECA  demonstrates severe amount of fibrous plaque   History of Doppler ultrasound 11/09/2011   LEAs; R ABI - mod art. insuff.; L ABI normal at rest; R SFA - occlusive ds; L SFA - occlusive ds; patent fem-pop graft   History of echocardiogram 08/27/2009   EF >55%   History of hiatal hernia    History of kidney stones 1965   History of nuclear stress test 11/24/2011   lexiscan; normal perfusion; low risk scan; non-diagnostic  for ischemia   Hyperlipidemia    Hypertension    Left carotid artery stenosis 04/08/2014   Pneumonia    Pre-diabetes    Pulmonary nodule, right 04/08/2014   2.8 mm-incidental finding on CT   PVD (peripheral vascular disease) ([place])    Tachyarrhythmia 1999   Status post ablation at [facility]   Tobacco abuse     Past Surgical History:  Procedure Laterality Date   ABDOMINAL AORTOGRAM W/LOWER EXTREMITY N/A 04/01/2021   Procedure: ABDOMINAL AORTOGRAM W/LOWER EXTREMITY;  Surgeon: [name], MD;  Location: [place] [facility];  Service: Cardiovascular;  Laterality: N/A;   BIOPSY  10/24/2017   Procedure: BIOPSY;  Surgeon: [name], MD;  [facility];  Service: Endoscopy;;  esophagus   BIOPSY  10/22/2021   Procedure: BIOPSY;  Surgeon: [name], MD;  [facility];  Service: Endoscopy;;   CATARACT EXTRACTION Bilateral    COLONOSCOPY  03/17/2009   Dr.Rourk- normal rectum, sigmoid diverticula, some pale sigmoid mucosa with diffuse petechiae. pedunculated polyp at the splenic flexure, remainder of colonic mucosa appeared normal. bx= adenomatous polyp   COLONOSCOPY  04/19/2003   Dr.Rehman- few diverticu;a at the sigmoid colon, 3 small polyps, one at the transverse colon and 2 at the sigmoid, small external hemorrhoids. bx report not available.    COLONOSCOPY WITH PROPOFOL N/A 10/24/2017   Dr. [name]: Diverticulosis, 11  mm polyp at the ileocecal valve, tubular adenoma.  Repeat colonoscopy in 3 years   COLONOSCOPY WITH PROPOFOL N/A 10/22/2021   Procedure: COLONOSCOPY WITH PROPOFOL;  Surgeon: [name], MD;  [facility];  Service: Endoscopy;  Laterality: N/A;  11:00am   CORONARY ARTERY BYPASS GRAFT  1998   [name]   ENDARTERECTOMY FEMORAL Right 04/13/2021   Procedure: RIGHT ILIOFEMORAL ARTERY ENDARTERECTOMY WITH PATCH ANGIOPLASTY USING HEMASHIELD PALTINUM FINESSE DACRON PATCH;  Surgeon: [name], MD;  Location: [place];  Service: Vascular;   Laterality: Right;   ESOPHAGECTOMY  2010   [facility] Dr. [name]   ESOPHAGOGASTRODUODENOSCOPY  11/25/2010   Dr.Rourk- s/p esophagectomy with gastric pull-up, esophageal erosions straddling the surgical anastomosis, salmon colored epithelium coming up a good centimeter to a centimeter and a half above the suture line, islands of salmon colored epithelium in the most poximal residual esophagus, remainder of gastric mucosa appeared normal. bx= swamous &gastric glandular mucosa w/chronic active inflammation   ESOPHAGOGASTRODUODENOSCOPY  03/17/2009   Dr.Rourk- 4cm segment of salmon-colored epithlium distal esophagus suspicious for barretts esophagus. area of suspicious nodularity w/in this segment bx seperately. small to moderate size hiatal hernia, o/w normal stomach D1 and D2 bx=adenocarcinoma   ESOPHAGOGASTRODUODENOSCOPY (EGD) WITH PROPOFOL N/A 10/24/2017   Dr. [name]: Remnant of esophagus with anastomosis with stomach at 24 cm from the incisors, 2 cm above the anastomosis he was found to have Barrett's but no dysplasia.  Advised for repeat EGD in 3 years   ESOPHAGOGASTRODUODENOSCOPY (EGD) WITH PROPOFOL N/A 10/22/2021   Procedure: ESOPHAGOGASTRODUODENOSCOPY (EGD) WITH PROPOFOL;  Surgeon: [name], MD;  [facility];  Service: Endoscopy;  Laterality: N/A;   EYE SURGERY     cataract   FEMORAL-POPLITEAL BYPASS GRAFT  1993   occlusive ds in R SFA   FEMORAL-POPLITEAL BYPASS GRAFT Left 05/11/2021   Procedure: LEFT FEMORAL-BELOW KNEE POPLITEAL ARTERY BYPASS GRAFT WITH 6 mm PTFE and COMPLETION ANGIOGRAM;  Surgeon: [name], MD;  Location: [place];  Service: Vascular;  Laterality: Left;   GASTRIC PULL THROUGH  2010   With esophagectomy   INSERTION OF ILIAC STENT Bilateral 04/13/2021   Procedure: INSERTION OF BILATERAL COMMON ILIAC KISSING STENTS AND INSERION OF RIGHT EXTERNAL ILIAC STENT;  Surgeon: [name], MD;  Location: [place];  Service: Vascular;  Laterality: Bilateral;    JOINT REPLACEMENT     LIPOMA EXCISION  2010   LOWER EXTREMITY ANGIOGRAM Left 04/13/2021   Procedure: LEFT LOWER EXTREMITY ANGIOGRAM ;  Surgeon: [name], MD;  Location: [place];  Service: Vascular;  Laterality: Left;   PERIPHERAL VASCULAR INTERVENTION  04/01/2021   Procedure: PERIPHERAL VASCULAR INTERVENTION;  Surgeon: [name], MD;  Location: [place] [facility];  Service: Cardiovascular;;   POLYPECTOMY  10/24/2017   Procedure: POLYPECTOMY;  Surgeon: [name], MD;  [facility];  Service: Endoscopy;;  colon    POLYPECTOMY  10/22/2021   Procedure: POLYPECTOMY;  Surgeon: [name], MD;  [facility];  Service: Endoscopy;;   TOTAL HIP ARTHROPLASTY Left 01/30/2019   Procedure: TOTAL HIP ARTHROPLASTY ANTERIOR APPROACH;  Surgeon: Renette Butters, MD;  Location: [facility];  Service: Orthopedics;  Laterality: Left;   ULTRASOUND GUIDANCE FOR VASCULAR ACCESS Left 04/13/2021   Procedure: ULTRASOUND GUIDANCE FOR VASCULAR ACCESS, left femoral;  Surgeon: [name], MD;  [facility];  Service: Vascular;  Laterality: Left;    Social History   Socioeconomic History  Marital status: Married    Spouse name: Not on file   Number of children: 2   Years of education: Not on file   Highest education level: Not on file  Occupational History   Occupation: retired    Fish farm manager: RETIRED    Comment: truck driver  Tobacco Use   Smoking status: Former    Packs/day: 2.00    Years: 40.00    Total pack years: 80.00    Types: Cigarettes    Start date: 73    Quit date: 07/20/1995    Years since quitting: 27.1    Passive exposure: Never   Smokeless tobacco: Never  Vaping Use   Vaping Use: Never used  Substance and Sexual Activity   Alcohol use: No    Alcohol/week: 0.0 standard drinks of alcohol   Drug use: No   Sexual activity: Yes    Partners: Female    Birth control/protection: Condom    Comment: friend  Other Topics Concern   Not on file   Social History Narrative   Not on file   Social Determinants of Health   Financial Resource Strain: Not on file  Food Insecurity: Not on file  Transportation Needs: Not on file  Physical Activity: Not on file  Stress: Not on file  Social Connections: Not on file  Intimate Partner Violence: Not on file    Family History  Problem Relation Age of Onset   GER disease Mother    Coronary artery disease Brother    Congenital heart disease Sister    Colon cancer Neg Hx     Current Outpatient Medications  Medication Sig Dispense Refill   acetaminophen (TYLENOL) 500 MG tablet Take 1,000 mg by mouth daily as needed for headache.     albuterol (PROVENTIL) (2.5 MG/3ML) 0.083% nebulizer solution Take 3 mLs (2.5 mg total) by nebulization every 6 (six) hours as needed for wheezing or shortness of breath. DX: J44.9 75 mL 6   albuterol (VENTOLIN HFA) 108 (90 Base) MCG/ACT inhaler Inhale 2 puffs into the lungs every 6 (six) hours as needed for wheezing or shortness of breath.     ALPRAZolam (XANAX) 0.5 MG tablet Take 1 tablet (0.5 mg total) by mouth at bedtime. 30 tablet 0   amLODipine (NORVASC) 5 MG tablet Take 1 tablet (5 mg total) by mouth daily. 90 tablet 3   amoxicillin-clavulanate (AUGMENTIN) 875-125 MG tablet Take 1 tablet by mouth 2 (two) times daily. 14 tablet 0   baclofen (LIORESAL) 10 MG tablet Take 1 tablet (10 mg total) by mouth 3 (three) times daily as needed for muscle spasms. 20 each 0   clopidogrel (PLAVIX) 75 MG tablet TAKE ONE (1) TABLET BY MOUTH EVERY DAY 90 tablet 0   furosemide (LASIX) 40 MG tablet Take 40 mg by mouth daily as needed for fluid.     gabapentin (NEURONTIN) 100 MG capsule Take 100 mg by mouth 3 (three) times daily as needed for pain.     guaiFENesin (MUCINEX) 600 MG 12 hr tablet Take 600 mg by mouth every 12 (twelve) hours as needed (cough).     lansoprazole (PREVACID) 30 MG capsule Take 1 capsule (30 mg total) by mouth daily before supper. 90 capsule 3    latanoprost (XALATAN) 0.005 % ophthalmic solution Place 1 drop into both eyes at bedtime.      losartan (COZAAR) 100 MG tablet TAKE ONE TABLET BY MOUTH EVERY NIGHT AT BEDTIME 90 tablet 3   metoprolol succinate (TOPROL-XL) 50 MG  24 hr tablet TAKE ONE (1) TABLET BY MOUTH EVERY DAY 90 tablet 1   montelukast (SINGULAIR) 10 MG tablet Take 1 tablet (10 mg total) by mouth daily. 90 tablet 3   Multiple Vitamins-Minerals (CENTRUM SILVER PO) Take 1 tablet by mouth daily.     nitroGLYCERIN (NITROSTAT) 0.4 MG SL tablet Place 1 tablet (0.4 mg total) under the tongue every 5 (five) minutes as needed for chest pain. 25 tablet 0   rosuvastatin (CRESTOR) 20 MG tablet Take 1 tablet (20 mg total) by mouth daily. 30 tablet 3   sucralfate (CARAFATE) 1 g tablet Take 1 tablet (1 g total) by mouth 4 (four) times daily -  with meals and at bedtime. As needed for esophageal pain, heartburn. You can crush in couple of tablespoons of water to make suspension. 30 tablet 0   timolol (TIMOPTIC) 0.5 % ophthalmic solution Place 1 drop into both eyes in the morning and at bedtime.     umeclidinium-vilanterol (ANORO ELLIPTA) 62.5-25 MCG/ACT AEPB Inhale 1 puff into the lungs daily. 60 each 5   aspirin EC 81 MG tablet Take 1 tablet (81 mg total) by mouth daily. (Patient not taking: Reported on 08/27/2022)     No current facility-administered medications for this visit.    Allergies  Allergen Reactions   Prednisone Other (See Comments)    "throat broke out"  Sores in mouth  Sores in mouth   Altace [Ramipril] Cough     REVIEW OF SYSTEMS:   [X]$  denotes positive finding, [ ]$  denotes negative finding Cardiac  Comments:  Chest pain or chest pressure:    Shortness of breath upon exertion:    Short of breath when lying flat:    Irregular heart rhythm:        Vascular    Pain in calf, thigh, or hip brought on by ambulation:    Pain in feet at night that wakes you up from your sleep:     Blood clot in your veins:    Leg  swelling:         Pulmonary    Oxygen at home:    Productive cough:     Wheezing:         Neurologic    Sudden weakness in arms or legs:     Sudden numbness in arms or legs:     Sudden onset of difficulty speaking or slurred speech:    Temporary loss of vision in one eye:     Problems with dizziness:         Gastrointestinal    Blood in stool:     Vomited blood:         Genitourinary    Burning when urinating:     Blood in urine:        Psychiatric    Major depression:         Hematologic    Bleeding problems:    Problems with blood clotting too easily:        Skin    Rashes or ulcers:        Constitutional    Fever or chills:      PHYSICAL EXAMINATION:  Vitals:   08/27/22 0946  BP: (!) 175/74  Pulse: 75  Resp: 20  Temp: (!) 97.5 F (36.4 C)  TempSrc: Temporal  SpO2: 97%  Weight: 157 lb 6.4 oz (71.4 kg)  Height: 5' 8"$  (1.727 m)    General:  WDWN in NAD; vital signs documented above  Gait: Not observed HENT: WNL, normocephalic Pulmonary: normal non-labored breathing , without Rales, rhonchi,  wheezing Cardiac: regular HR Abdomen: soft, NT, no masses Skin: without rashes Vascular Exam/Pulses: Palpable left DP; symmetrical 2+ femoral pulses; absent right pedal pulses Extremities: without ischemic changes, without Gangrene , without cellulitis; without open wounds;  Musculoskeletal: no muscle wasting or atrophy  Neurologic: A&O X 3;  No focal weakness or paresthesias are detected Psychiatric:  The pt has Normal affect.   Non-Invasive Vascular Imaging:   Right ICA 1 to 39% stenosis, left ICA 40 to 59% stenosis  Left proximal EIA stent with peak systolic velocity greater than 600 Right common and left common iliac stents patent Left leg bypass widely patent  Incompressible tibials    ASSESSMENT/PLAN:: 77 y.o. male here for follow up for surveillance of PAD and carotid artery stenosis  -Carotid duplex demonstrates 1 to 39% stenosis of the right  ICA and 40 to 59% stenosis of the left ICA.  We will repeat carotid duplex in 1 year.  -Aortoiliac duplex demonstrates severe stenosis of the proximal left external iliac artery stent.  Plan will be for aortogram with left leg runoff via right common femoral artery.  This will be performed by Dr. Unk Lightning this coming Wednesday, 09/01/2022.  All questions were answered and he agrees to proceed.  He will continue his Plavix perioperatively.   Dagoberto Ligas, PA-C [facility] [phone]  Clinic MD:   Virl Cagey

## 2022-09-01 ENCOUNTER — Ambulatory Visit: Payer: Medicare Other | Admitting: Cardiology

## 2022-09-01 ENCOUNTER — Encounter (HOSPITAL_COMMUNITY)
Admission: RE | Disposition: A | Discharge status: Home / Self Care | Payer: Self-pay | Source: Home / Self Care | Attending: Vascular Surgery

## 2022-09-01 ENCOUNTER — Other Ambulatory Visit: Payer: Self-pay

## 2022-09-01 ENCOUNTER — Ambulatory Visit (HOSPITAL_COMMUNITY)
Admission: RE | Admit: 2022-09-01 | Discharge: 2022-09-01 | Disposition: A | Discharge status: Home / Self Care | Payer: Medicare Other | Attending: Vascular Surgery | Admitting: Vascular Surgery

## 2022-09-01 DIAGNOSIS — Z79899 Other long term (current) drug therapy: Secondary | ICD-10-CM | POA: Insufficient documentation

## 2022-09-01 DIAGNOSIS — I70222 Atherosclerosis of native arteries of extremities with rest pain, left leg: Secondary | ICD-10-CM

## 2022-09-01 DIAGNOSIS — Z87891 Personal history of nicotine dependence: Secondary | ICD-10-CM | POA: Insufficient documentation

## 2022-09-01 DIAGNOSIS — I739 Peripheral vascular disease, unspecified: Secondary | ICD-10-CM | POA: Diagnosis not present

## 2022-09-01 DIAGNOSIS — Y832 Surgical operation with anastomosis, bypass or graft as the cause of abnormal reaction of the patient, or of later complication, without mention of misadventure at the time of the procedure: Secondary | ICD-10-CM | POA: Insufficient documentation

## 2022-09-01 DIAGNOSIS — I708 Atherosclerosis of other arteries: Secondary | ICD-10-CM | POA: Diagnosis not present

## 2022-09-01 DIAGNOSIS — Z7902 Long term (current) use of antithrombotics/antiplatelets: Secondary | ICD-10-CM | POA: Diagnosis not present

## 2022-09-01 DIAGNOSIS — I6523 Occlusion and stenosis of bilateral carotid arteries: Secondary | ICD-10-CM | POA: Diagnosis not present

## 2022-09-01 DIAGNOSIS — T82858A Stenosis of vascular prosthetic devices, implants and grafts, initial encounter: Secondary | ICD-10-CM | POA: Diagnosis not present

## 2022-09-01 DIAGNOSIS — T82856A Stenosis of peripheral vascular stent, initial encounter: Secondary | ICD-10-CM | POA: Insufficient documentation

## 2022-09-01 HISTORY — PX: PERIPHERAL VASCULAR INTERVENTION: CATH118257

## 2022-09-01 HISTORY — PX: ABDOMINAL AORTOGRAM W/LOWER EXTREMITY: CATH118223

## 2022-09-01 LAB — POCT I-STAT, CHEM 8
BUN: 12 mg/dL (ref 8–23)
Calcium, Ion: 1.27 mmol/L (ref 1.15–1.40)
Chloride: 106 mmol/L (ref 98–111)
Creatinine, Ser: 0.7 mg/dL (ref 0.61–1.24)
Glucose, Bld: 96 mg/dL (ref 70–99)
HCT: 46 % (ref 39.0–52.0)
Hemoglobin: 15.6 g/dL (ref 13.0–17.0)
Potassium: 3.9 mmol/L (ref 3.5–5.1)
Sodium: 143 mmol/L (ref 135–145)
TCO2: 25 mmol/L (ref 22–32)

## 2022-09-01 SURGERY — ABDOMINAL AORTOGRAM W/LOWER EXTREMITY
Anesthesia: LOCAL | Laterality: Right

## 2022-09-01 MED ORDER — MIDAZOLAM HCL 5 MG/5ML IJ SOLN
INTRAMUSCULAR | Status: AC
  Filled 2022-09-01: qty 5

## 2022-09-01 MED ORDER — MIDAZOLAM HCL 2 MG/2ML IJ SOLN
INTRAMUSCULAR | Status: DC | PRN
  Administered 2022-09-01: 1 mg via INTRAVENOUS

## 2022-09-01 MED ORDER — FENTANYL CITRATE (PF) 100 MCG/2ML IJ SOLN
INTRAMUSCULAR | Status: DC | PRN
  Administered 2022-09-01: 50 ug via INTRAVENOUS

## 2022-09-01 MED ORDER — SODIUM CHLORIDE 0.9 % IV SOLN: INTRAVENOUS | Status: DC

## 2022-09-01 MED ORDER — ONDANSETRON HCL 4 MG/2ML IJ SOLN: 4.0000 mg | Freq: Four times a day (QID) | INTRAMUSCULAR | Status: DC | PRN

## 2022-09-01 MED ORDER — SODIUM CHLORIDE 0.9 % WEIGHT BASED INFUSION: 1.0000 mL/kg/h | INTRAVENOUS | Status: DC

## 2022-09-01 MED ORDER — SODIUM CHLORIDE 0.9 % IV SOLN: 250.0000 mL | INTRAVENOUS | Status: DC | PRN

## 2022-09-01 MED ORDER — SODIUM CHLORIDE 0.9% FLUSH: 3.0000 mL | Freq: Two times a day (BID) | INTRAVENOUS | Status: DC

## 2022-09-01 MED ORDER — ACETAMINOPHEN 325 MG PO TABS: 650.0000 mg | ORAL_TABLET | ORAL | Status: DC | PRN

## 2022-09-01 MED ORDER — ASPIRIN 81 MG PO TBEC: 81.0000 mg | DELAYED_RELEASE_TABLET | Freq: Every day | ORAL | Status: DC

## 2022-09-01 MED ORDER — HEPARIN SODIUM (PORCINE) 1000 UNIT/ML IJ SOLN
INTRAMUSCULAR | Status: AC
  Filled 2022-09-01: qty 10

## 2022-09-01 MED ORDER — LIDOCAINE HCL (PF) 1 % IJ SOLN
INTRAMUSCULAR | Status: DC | PRN
  Administered 2022-09-01: 10 mL

## 2022-09-01 MED ORDER — HEPARIN SODIUM (PORCINE) 1000 UNIT/ML IJ SOLN
INTRAMUSCULAR | Status: DC | PRN
  Administered 2022-09-01: 5000 [IU] via INTRAVENOUS

## 2022-09-01 MED ORDER — LIDOCAINE HCL (PF) 1 % IJ SOLN
INTRAMUSCULAR | Status: AC
  Filled 2022-09-01: qty 30

## 2022-09-01 MED ORDER — HEPARIN (PORCINE) IN NACL 1000-0.9 UT/500ML-% IV SOLN
INTRAVENOUS | Status: DC | PRN
  Administered 2022-09-01 (×2): 500 mL

## 2022-09-01 MED ORDER — IODIXANOL 320 MG/ML IV SOLN
INTRAVENOUS | Status: DC | PRN
  Administered 2022-09-01: 110 mL

## 2022-09-01 MED ORDER — SODIUM CHLORIDE 0.9% FLUSH: 3.0000 mL | INTRAVENOUS | Status: DC | PRN

## 2022-09-01 MED ORDER — ASPIRIN 81 MG PO TBEC: 81.0000 mg | DELAYED_RELEASE_TABLET | Freq: Every day | ORAL | 2 refills | Status: AC

## 2022-09-01 MED ORDER — HYDRALAZINE HCL 20 MG/ML IJ SOLN: 5.0000 mg | INTRAMUSCULAR | Status: DC | PRN

## 2022-09-01 MED ORDER — LABETALOL HCL 5 MG/ML IV SOLN: 10.0000 mg | INTRAVENOUS | Status: DC | PRN

## 2022-09-01 MED ORDER — FENTANYL CITRATE (PF) 100 MCG/2ML IJ SOLN
INTRAMUSCULAR | Status: AC
  Filled 2022-09-01: qty 2

## 2022-09-01 SURGICAL SUPPLY — 24 items
CATH OMNI FLUSH 5F 65CM (CATHETERS) IMPLANT
COVER DOME SNAP 22 D (MISCELLANEOUS) IMPLANT
DCB RANGER 7.0X40 135 (BALLOONS) IMPLANT
DCB RANGER 7.0X60 135 (BALLOONS) IMPLANT
DEVICE CLOSURE MYNXGRIP 6/7F (Vascular Products) IMPLANT
KIT ENCORE 26 ADVANTAGE (KITS) IMPLANT
KIT MICROPUNCTURE NIT STIFF (SHEATH) IMPLANT
KIT PV (KITS) ×2 IMPLANT
RANGER DCB 7.0X40 135 (BALLOONS) ×2
RANGER DCB 7.0X60 135 (BALLOONS) ×2
SHEATH CATAPULT 6FR 45 (SHEATH) IMPLANT
SHEATH PINNACLE 5F 10CM (SHEATH) IMPLANT
SHEATH PINNACLE 6F 10CM (SHEATH) IMPLANT
SHEATH PINNACLE 7F 10CM (SHEATH) IMPLANT
SHEATH PROBE COVER 6X72 (BAG) IMPLANT
STENT VIABAHN 8X50X120 (Permanent Stent) IMPLANT
STENT VIABAHN5X120X8X (Permanent Stent) ×2 IMPLANT
STOPCOCK MORSE 400PSI 3WAY (MISCELLANEOUS) IMPLANT
SYR MEDRAD MARK 7 150ML (SYRINGE) ×2 IMPLANT
TRANSDUCER W/STOPCOCK (MISCELLANEOUS) ×2 IMPLANT
TRAY PV CATH (CUSTOM PROCEDURE TRAY) ×2 IMPLANT
TUBING CIL FLEX 10 FLL-RA (TUBING) IMPLANT
WIRE G V18X300CM (WIRE) IMPLANT
WIRE STARTER BENTSON 035X150 (WIRE) IMPLANT

## 2022-09-01 NOTE — H&P (Signed)
Office Note   Patient seen and examined in preop holding.  No complaints. No changes to medication history or physical exam since last seen in clinic. After discussing the risks and benefits of aortogram, left leg angiogram for assisted primary patency of previously placed stents, Gregory Eaton elected to proceed.   [name] MD   CC:  follow up Requesting Provider:  No ref. provider found  HPI: Gregory Eaton is a 77 y.o. ([birth year]) male who presents for surveillance of PAD.  Surgical history significant for left femoral to below the knee popliteal bypass with ringed PTFE on 05/11/2021.  He has also had multiple interventions including right iliofemoral endarterectomy with bilateral common iliac stents and right external iliac stent on 04/11/2022.  He also had left external iliac artery stenting on 04/03/2022.  He denies any claudication, rest pain, or tissue loss.  He is on Plavix and statin daily.  He denies tobacco use.  He is also followed for carotid artery stenosis.  He does not have any strokelike symptoms including slurring speech, changes in vision, or one-sided weakness.  Past Medical History:  Diagnosis Date   Adenomatous colon polyp 03/2009   Last colonoscopy by Dr. [name]    Adenomatous polyp 2010   Adenomatous polyp of colon 11/03/2010   Arthritis    Barrett's esophagus    CAD (coronary artery disease)    Complication of anesthesia    has a shortened esophogus due to CA.    COPD (chronic obstructive pulmonary disease) (HCC)    Severe emphysema per CT   Diverticulosis    Emphysema of lung (HCC)    Esophageal carcinoma ([place]) 03/2009   T1N1M0   GERD (gastroesophageal reflux disease)    Glaucoma    History of Doppler ultrasound 11/09/2011   03/2014- 50-69% L ICA stenosis;carotid doppler; L bulb/prox ICA 0-49% diameter reduction; L vertebral artery - occlusive ds; L ECA  demonstrates severe amount of fibrous plaque   History of Doppler ultrasound 11/09/2011    LEAs; R ABI - mod art. insuff.; L ABI normal at rest; R SFA - occlusive ds; L SFA - occlusive ds; patent fem-pop graft   History of echocardiogram 08/27/2009   EF >55%   History of hiatal hernia    History of kidney stones 1965   History of nuclear stress test 11/24/2011   lexiscan; normal perfusion; low risk scan; non-diagnostic for ischemia   Hyperlipidemia    Hypertension    Left carotid artery stenosis 04/08/2014   Pneumonia    Pre-diabetes    Pulmonary nodule, right 04/08/2014   2.8 mm-incidental finding on CT   PVD (peripheral vascular disease) ([place])    Tachyarrhythmia 1999   Status post ablation at [facility]   Tobacco abuse     Past Surgical History:  Procedure Laterality Date   ABDOMINAL AORTOGRAM W/LOWER EXTREMITY N/A 04/01/2021   Procedure: ABDOMINAL AORTOGRAM W/LOWER EXTREMITY;  Surgeon: [name], MD;  Location: [place] [facility];  Service: Cardiovascular;  Laterality: N/A;   BIOPSY  10/24/2017   Procedure: BIOPSY;  Surgeon: [name], MD;  [facility];  Service: Endoscopy;;  esophagus   BIOPSY  10/22/2021   Procedure: BIOPSY;  Surgeon: [name], MD;  [facility];  Service: Endoscopy;;   CATARACT EXTRACTION Bilateral    COLONOSCOPY  03/17/2009   Dr.Rourk- normal rectum, sigmoid diverticula, some pale sigmoid mucosa with diffuse petechiae. pedunculated polyp at the splenic flexure, remainder of colonic  mucosa appeared normal. bx= adenomatous polyp   COLONOSCOPY  04/19/2003   Dr.Rehman- few diverticu;a at the sigmoid colon, 3 small polyps, one at the transverse colon and 2 at the sigmoid, small external hemorrhoids. bx report not available.    COLONOSCOPY WITH PROPOFOL N/A 10/24/2017   Dr. [name]: Diverticulosis, 11 mm polyp at the ileocecal valve, tubular adenoma.  Repeat colonoscopy in 3 years   COLONOSCOPY WITH PROPOFOL N/A 10/22/2021   Procedure: COLONOSCOPY WITH PROPOFOL;  Surgeon: [name], MD;  [facility];   Service: Endoscopy;  Laterality: N/A;  11:00am   CORONARY ARTERY BYPASS GRAFT  1998   [name]   ENDARTERECTOMY FEMORAL Right 04/13/2021   Procedure: RIGHT ILIOFEMORAL ARTERY ENDARTERECTOMY WITH PATCH ANGIOPLASTY USING HEMASHIELD PALTINUM FINESSE DACRON PATCH;  Surgeon: [name], MD;  Location: [place];  Service: Vascular;  Laterality: Right;   ESOPHAGECTOMY  2010   [facility] Dr. [name]   ESOPHAGOGASTRODUODENOSCOPY  11/25/2010   Dr.Rourk- s/p esophagectomy with gastric pull-up, esophageal erosions straddling the surgical anastomosis, salmon colored epithelium coming up a good centimeter to a centimeter and a half above the suture line, islands of salmon colored epithelium in the most poximal residual esophagus, remainder of gastric mucosa appeared normal. bx= swamous &gastric glandular mucosa w/chronic active inflammation   ESOPHAGOGASTRODUODENOSCOPY  03/17/2009   Dr.Rourk- 4cm segment of salmon-colored epithlium distal esophagus suspicious for barretts esophagus. area of suspicious nodularity w/in this segment bx seperately. small to moderate size hiatal hernia, o/w normal stomach D1 and D2 bx=adenocarcinoma   ESOPHAGOGASTRODUODENOSCOPY (EGD) WITH PROPOFOL N/A 10/24/2017   Dr. [name]: Remnant of esophagus with anastomosis with stomach at 24 cm from the incisors, 2 cm above the anastomosis he was found to have Barrett's but no dysplasia.  Advised for repeat EGD in 3 years   ESOPHAGOGASTRODUODENOSCOPY (EGD) WITH PROPOFOL N/A 10/22/2021   Procedure: ESOPHAGOGASTRODUODENOSCOPY (EGD) WITH PROPOFOL;  Surgeon: [name], MD;  [facility];  Service: Endoscopy;  Laterality: N/A;   EYE SURGERY     cataract   FEMORAL-POPLITEAL BYPASS GRAFT  1993   occlusive ds in R SFA   FEMORAL-POPLITEAL BYPASS GRAFT Left 05/11/2021   Procedure: LEFT FEMORAL-BELOW KNEE POPLITEAL ARTERY BYPASS GRAFT WITH 6 mm PTFE and COMPLETION ANGIOGRAM;  Surgeon: [name], MD;  Location: [place];   Service: Vascular;  Laterality: Left;   GASTRIC PULL THROUGH  2010   With esophagectomy   INSERTION OF ILIAC STENT Bilateral 04/13/2021   Procedure: INSERTION OF BILATERAL COMMON ILIAC KISSING STENTS AND INSERION OF RIGHT EXTERNAL ILIAC STENT;  Surgeon: [name], MD;  Location: [place];  Service: Vascular;  Laterality: Bilateral;   JOINT REPLACEMENT     LIPOMA EXCISION  2010   LOWER EXTREMITY ANGIOGRAM Left 04/13/2021   Procedure: LEFT LOWER EXTREMITY ANGIOGRAM ;  Surgeon: [name], MD;  Location: [place];  Service: Vascular;  Laterality: Left;   PERIPHERAL VASCULAR INTERVENTION  04/01/2021   Procedure: PERIPHERAL VASCULAR INTERVENTION;  Surgeon: [name], MD;  Location: [place] [facility];  Service: Cardiovascular;;   POLYPECTOMY  10/24/2017   Procedure: POLYPECTOMY;  Surgeon: [name], MD;  [facility];  Service: Endoscopy;;  colon    POLYPECTOMY  10/22/2021   Procedure: POLYPECTOMY;  Surgeon: [name], MD;  [facility];  Service: Endoscopy;;   TOTAL HIP ARTHROPLASTY Left 01/30/2019   Procedure: TOTAL HIP ARTHROPLASTY ANTERIOR APPROACH;  Surgeon: Renette Butters, MD;  Location: [facility];  Service: Orthopedics;  Laterality: Left;   ULTRASOUND GUIDANCE FOR VASCULAR ACCESS Left 04/13/2021   Procedure: ULTRASOUND GUIDANCE FOR VASCULAR ACCESS, left femoral;  Surgeon: [name], MD;  Location: [facility];  Service: Vascular;  Laterality: Left;    Social History   Socioeconomic History   Marital status: Married    Spouse name: Not on file   Number of children: 2   Years of education: Not on file   Highest education level: Not on file  Occupational History   Occupation: retired    Fish farm manager: RETIRED    Comment: truck driver  Tobacco Use   Smoking status: Former    Packs/day: 2.00    Years: 40.00    Total pack years: 80.00    Types: Cigarettes    Start date: 32    Quit date: 07/20/1995    Years since quitting: 27.1    Passive  exposure: Never   Smokeless tobacco: Never  Vaping Use   Vaping Use: Never used  Substance and Sexual Activity   Alcohol use: No    Alcohol/week: 0.0 standard drinks of alcohol   Drug use: No   Sexual activity: Yes    Partners: Female    Birth control/protection: Condom    Comment: friend  Other Topics Concern   Not on file  Social History Narrative   Not on file   Social Determinants of Health   Financial Resource Strain: Not on file  Food Insecurity: Not on file  Transportation Needs: Not on file  Physical Activity: Not on file  Stress: Not on file  Social Connections: Not on file  Intimate Partner Violence: Not on file    Family History  Problem Relation Age of Onset   GER disease Mother    Coronary artery disease Brother    Congenital heart disease Sister    Colon cancer Neg Hx     Current Facility-Administered Medications  Medication Dose Route Frequency Provider Last Rate Last Admin   0.9 %  sodium chloride infusion   Intravenous Continuous [name], MD 100 mL/hr at 09/01/22 0939 New Bag at 09/01/22 0939   fentaNYL (SUBLIMAZE) injection    PRN [name], MD   50 mcg at 09/01/22 1120   Heparin (Porcine) in NaCl 1000-0.9 UT/500ML-% SOLN    PRN [name], MD   500 mL at 09/01/22 1112   heparin sodium (porcine) injection    PRN [name], MD   5,000 Units at 09/01/22 1145   iodixanol (VISIPAQUE) 320 MG/ML injection    PRN [name], MD   110 mL at 09/01/22 1245   lidocaine (PF) (XYLOCAINE) 1 % injection    PRN [name], MD   10 mL at 09/01/22 1120   midazolam (VERSED) injection    PRN [name], MD   1 mg at 09/01/22 1120    Allergies  Allergen Reactions   Prednisone Other (See Comments)    "throat broke out" sore mouth Take lately and did not have any problems    Altace [Ramipril] Cough     REVIEW OF SYSTEMS:   [X]$  denotes positive finding, [ ]$  denotes negative finding Cardiac  Comments:  Chest pain or  chest pressure:    Shortness of breath upon exertion:    Short of breath when lying flat:    Irregular heart rhythm:        Vascular    Pain in calf, thigh,  or hip brought on by ambulation:    Pain in feet at night that wakes you up from your sleep:     Blood clot in your veins:    Leg swelling:         Pulmonary    Oxygen at home:    Productive cough:     Wheezing:         Neurologic    Sudden weakness in arms or legs:     Sudden numbness in arms or legs:     Sudden onset of difficulty speaking or slurred speech:    Temporary loss of vision in one eye:     Problems with dizziness:         Gastrointestinal    Blood in stool:     Vomited blood:         Genitourinary    Burning when urinating:     Blood in urine:        Psychiatric    Major depression:         Hematologic    Bleeding problems:    Problems with blood clotting too easily:        Skin    Rashes or ulcers:        Constitutional    Fever or chills:      PHYSICAL EXAMINATION:  Vitals:   09/01/22 0910 09/01/22 1110 09/01/22 1240  BP: (!) 172/70  (!) 151/74  Pulse: 80  78  Resp: 20  16  Temp: (!) 97.1 F (36.2 C)    TempSrc: Temporal    SpO2: 97% 96% 94%  Weight: 70.3 kg    Height: 5' 8"$  (1.727 m)      General:  WDWN in NAD; vital signs documented above Gait: Not observed HENT: WNL, normocephalic Pulmonary: normal non-labored breathing , without Rales, rhonchi,  wheezing Cardiac: regular HR Abdomen: soft, NT, no masses Skin: without rashes Vascular Exam/Pulses: Palpable left DP; symmetrical 2+ femoral pulses; absent right pedal pulses Extremities: without ischemic changes, without Gangrene , without cellulitis; without open wounds;  Musculoskeletal: no muscle wasting or atrophy  Neurologic: A&O X 3;  No focal weakness or paresthesias are detected Psychiatric:  The pt has Normal affect.   Non-Invasive Vascular Imaging:   Right ICA 1 to 39% stenosis, left ICA 40 to 59% stenosis  Left  proximal EIA stent with peak systolic velocity greater than 600 Right common and left common iliac stents patent Left leg bypass widely patent  Incompressible tibials    ASSESSMENT/PLAN:: 77 y.o. male here for follow up for surveillance of PAD and carotid artery stenosis  -Carotid duplex demonstrates 1 to 39% stenosis of the right ICA and 40 to 59% stenosis of the left ICA.  We will repeat carotid duplex in 1 year.  -Aortoiliac duplex demonstrates severe stenosis of the proximal left external iliac artery stent.  Plan will be for aortogram with left leg runoff via right common femoral artery.  This will be performed by Dr. Unk Lightning this coming Wednesday, 09/01/2022.  All questions were answered and he agrees to proceed.  He will continue his Plavix perioperatively.   [name], PA-C [facility] [phone]  Clinic MD:   Virl Cagey

## 2022-09-01 NOTE — Discharge Instructions (Signed)
Femoral Site Care This sheet gives you information about how to care for yourself after your procedure. Your health care provider may also give you more specific instructions. If you have problems or questions, contact your health care provider. What can I expect after the procedure?  After the procedure, it is common to have: Bruising that usually fades within 1-2 weeks. Tenderness at the site. Follow these instructions at home: Wound care Follow instructions from your health care provider about how to take care of your insertion site. Make sure you: Wash your hands with soap and water before you change your bandage (dressing). If soap and water are not available, use hand sanitizer. Remove your dressing as told by your health care provider. In 24 hours Do not take baths, swim, or use a hot tub until your health care provider approves. You may shower 24-48 hours after the procedure or as told by your health care provider. Gently wash the site with plain soap and water. Pat the area dry with a clean towel. Do not rub the site. This may cause bleeding. Do not apply powder or lotion to the site. Keep the site clean and dry. Check your femoral site every day for signs of infection. Check for: Redness, swelling, or pain. Fluid or blood. Warmth. Pus or a bad smell. Activity For the first 2-3 days after your procedure, or as long as directed: Avoid climbing stairs as much as possible. Do not squat. Do not lift anything that is heavier than 10 lb (4.5 kg), or the limit that you are told, until your health care provider says that it is safe. For 5 days Rest as directed. Avoid sitting for a long time without moving. Get up to take short walks every 1-2 hours. Do not drive for 24 hours if you were given a medicine to help you relax (sedative). General instructions Take over-the-counter and prescription medicines only as told by your health care provider. Keep all follow-up visits as told by  your health care provider. This is important. Contact a health care provider if you have: A fever or chills. You have redness, swelling, or pain around your insertion site. Get help right away if: The catheter insertion area swells very fast. You pass out. You suddenly start to sweat or your skin gets clammy. The catheter insertion area is bleeding, and the bleeding does not stop when you hold steady pressure on the area. The area near or just beyond the catheter insertion site becomes pale, cool, tingly, or numb. These symptoms may represent a serious problem that is an emergency. Do not wait to see if the symptoms will go away. Get medical help right away. Call your local emergency services (911 in the [place] Do not drive yourself to the hospital. Summary After the procedure, it is common to have bruising that usually fades within 1-2 weeks. Check your femoral site every day for signs of infection. Do not lift anything that is heavier than 10 lb (4.5 kg), or the limit that you are told, until your health care provider says that it is safe. This information is not intended to replace advice given to you by your health care provider. Make sure you discuss any questions you have with your health care provider. Document Revised: 07/18/2017 Document Reviewed: 07/18/2017 Elsevier Patient Education  2020 [employer].

## 2022-09-01 NOTE — Op Note (Signed)
Patient name: Gregory Eaton MRN: [id number] DOB: 01-19-1946 Sex: male  09/01/2022 Pre-operative Diagnosis: Threatened bilateral iliac artery stents with flow-limiting stenosis appreciated on ultrasound Post-operative diagnosis:  Same Surgeon:  [name], MD Procedure Performed: 1.  Ultrasound-guided micropuncture access of the right common femoral artery 2.  Aortogram 3.  Second-order cannulation, left lower extremity angiogram 4.  Drug-coated balloon angioplasty 7 x 60 mm of the right external iliac artery stent 5.  Drug-coated balloon angioplasty 7 x 40 mm of the right external iliac artery 6.  Gore Viabahn covered stenting 8 x 5 cm of the right external iliac artery postdilated with a 7 x 60 mm balloon 7.  Moderate sedation time 61 minutes 8.  Contrast volume 110 mL   Indications: Patient is a 77 year old male with history of multiple peripheral vascular inventions bilateral lower extremity peripheral arterial disease.  These include bilateral common iliac artery stenting 9 mm iliacs, bilateral external iliac artery stenting 8 mm (Viabahn right, Innova Left), left femoral to popliteal artery bypass using 6 mm ringed PTFE.  Patient presented for yearly checkup and was noted to have elevated velocities in the external iliacs bilaterally after discussing the risk and benefits of bilateral lower extremity angiogram in an effort to define and resolve stenosis for primary assisted patency, [name] elected to proceed.  Findings:  Bilateral common iliac artery stents widely patent left external iliac artery widely patent, bilateral hypogastric arteries occluded Right external iliac artery over the in-stent stenosis, the stenosis extends 2 cm from the distal aspect of the stent. The left leg: Widely patent common femoral artery, patent profunda, patent femoral to below-knee popliteal artery bypass.  Three-vessel runoff with diffuse tibial disease.  There is filling of the foot from the  dorsalis pedis and posterior tibial arteries.   Procedure:  The patient was identified in the holding area and taken to room 8.  The patient was then placed supine on the table and prepped and draped in the usual sterile fashion.  A time out was called.  Ultrasound was used to evaluate the right common femoral artery.  It was patent .  A digital ultrasound image was acquired.  A micropuncture needle was used to access the right common femoral artery under ultrasound guidance.  An 018 wire was advanced without resistance and a micropuncture sheath was placed.  The 018 wire was removed and a benson wire was placed.  The micropuncture sheath was exchanged for a 5 french sheath.  An omniflush catheter was advanced over the wire to the level of L-1.  An abdominal angiogram was obtained.  Next, using the omniflush catheter and a benson wire, the aortic bifurcation was crossed and the catheter was placed into theleft external iliac artery and left runoff was obtained.  I elected to intervene on the right-sided in-stent restenosis, external iliacs stenosis.  The [place] sheath was upsized to 7 [place].  A 0.018 wire was run into the abdominal aorta.  Initially, I elected to use a 7 mm drug-coated balloon within the right external iliac artery stent.  This resolved to the level of stenosis.  Specimen radiograph obtained by 40 mm drug-coated balloon extending from the external iliac artery stent 2 cm.  Follow-up angiography demonstrated from possible intimal injury.  I elected to 10 x 5 cm Viabahn to cover this lesion.  This was postdilated using a 7 x 60 mm balloon.   Impression: Successful resolution of in-stent restenosis of the right common iliac artery stent,  with 61m gore viabahn extension into the distal external iliac artery.      JCassandria Santee MD [facility] GBensonOffice: [phone]

## 2022-09-02 ENCOUNTER — Encounter (HOSPITAL_COMMUNITY): Payer: Self-pay | Admitting: Vascular Surgery

## 2022-09-02 MED FILL — Midazolam HCl Inj 5 MG/5ML (Base Equivalent): INTRAMUSCULAR | Qty: 1 | Status: AC

## 2022-09-10 DIAGNOSIS — Z8679 Personal history of other diseases of the circulatory system: Secondary | ICD-10-CM | POA: Diagnosis not present

## 2022-09-10 DIAGNOSIS — I6529 Occlusion and stenosis of unspecified carotid artery: Secondary | ICD-10-CM | POA: Diagnosis not present

## 2022-09-10 DIAGNOSIS — I739 Peripheral vascular disease, unspecified: Secondary | ICD-10-CM | POA: Diagnosis not present

## 2022-09-10 DIAGNOSIS — I10 Essential (primary) hypertension: Secondary | ICD-10-CM | POA: Diagnosis not present

## 2022-09-10 DIAGNOSIS — B079 Viral wart, unspecified: Secondary | ICD-10-CM | POA: Insufficient documentation

## 2022-09-21 ENCOUNTER — Other Ambulatory Visit: Payer: Self-pay

## 2022-09-21 DIAGNOSIS — I739 Peripheral vascular disease, unspecified: Secondary | ICD-10-CM

## 2022-09-21 DIAGNOSIS — I70222 Atherosclerosis of native arteries of extremities with rest pain, left leg: Secondary | ICD-10-CM

## 2022-09-21 DIAGNOSIS — Z95828 Presence of other vascular implants and grafts: Secondary | ICD-10-CM

## 2022-09-23 ENCOUNTER — Encounter: Payer: Self-pay | Admitting: Internal Medicine

## 2022-09-27 ENCOUNTER — Ambulatory Visit (HOSPITAL_BASED_OUTPATIENT_CLINIC_OR_DEPARTMENT_OTHER)
Admission: RE | Admit: 2022-09-27 | Discharge: 2022-09-27 | Disposition: A | Discharge status: Home / Self Care | Payer: Medicare Other | Source: Ambulatory Visit | Attending: Internal Medicine | Admitting: Internal Medicine

## 2022-09-27 ENCOUNTER — Other Ambulatory Visit (HOSPITAL_BASED_OUTPATIENT_CLINIC_OR_DEPARTMENT_OTHER): Payer: Self-pay | Admitting: Internal Medicine

## 2022-09-27 DIAGNOSIS — G4485 Primary stabbing headache: Secondary | ICD-10-CM | POA: Insufficient documentation

## 2022-09-27 DIAGNOSIS — R519 Headache, unspecified: Secondary | ICD-10-CM | POA: Diagnosis not present

## 2022-09-28 DIAGNOSIS — R519 Headache, unspecified: Secondary | ICD-10-CM | POA: Diagnosis not present

## 2022-09-29 DIAGNOSIS — R519 Headache, unspecified: Secondary | ICD-10-CM | POA: Diagnosis not present

## 2022-10-01 ENCOUNTER — Encounter (HOSPITAL_COMMUNITY): Payer: Medicare Other

## 2022-10-08 ENCOUNTER — Ambulatory Visit (INDEPENDENT_AMBULATORY_CARE_PROVIDER_SITE_OTHER): Payer: Medicare Other | Admitting: Vascular Surgery

## 2022-10-08 ENCOUNTER — Encounter: Payer: Self-pay | Admitting: Vascular Surgery

## 2022-10-08 ENCOUNTER — Ambulatory Visit (INDEPENDENT_AMBULATORY_CARE_PROVIDER_SITE_OTHER)
Admission: RE | Admit: 2022-10-08 | Discharge: 2022-10-08 | Disposition: A | Discharge status: Home / Self Care | Payer: Medicare Other | Source: Ambulatory Visit | Attending: Vascular Surgery | Admitting: Vascular Surgery

## 2022-10-08 ENCOUNTER — Ambulatory Visit (HOSPITAL_COMMUNITY)
Admission: RE | Admit: 2022-10-08 | Discharge: 2022-10-08 | Disposition: A | Discharge status: Home / Self Care | Payer: Medicare Other | Source: Ambulatory Visit | Attending: Vascular Surgery | Admitting: Vascular Surgery

## 2022-10-08 VITALS — BP 169/71 | HR 63 | Temp 97.9°F | Resp 20 | Ht 68.0 in | Wt 156.0 lb

## 2022-10-08 DIAGNOSIS — I6522 Occlusion and stenosis of left carotid artery: Secondary | ICD-10-CM | POA: Diagnosis not present

## 2022-10-08 DIAGNOSIS — I70222 Atherosclerosis of native arteries of extremities with rest pain, left leg: Secondary | ICD-10-CM | POA: Insufficient documentation

## 2022-10-08 DIAGNOSIS — I739 Peripheral vascular disease, unspecified: Secondary | ICD-10-CM

## 2022-10-08 DIAGNOSIS — Z95828 Presence of other vascular implants and grafts: Secondary | ICD-10-CM

## 2022-10-09 LAB — VAS US ABI WITH/WO TBI

## 2022-10-12 NOTE — Progress Notes (Signed)
Office Note     CC:  follow up Requesting Provider:  Celene Squibb, MD  HPI: Gregory Eaton is a 77 y.o. (Jan 12, 1946) male who presents for surveillance of PAD.  Surgical history significant for left femoral to below the knee popliteal bypass with ringed PTFE on 05/11/2021.  He has also had multiple interventions including right iliofemoral endarterectomy with bilateral common iliac stents and right external iliac stent on 04/11/2021.  He also had left external iliac artery stenting on 04/03/2021.    He denies any claudication, rest pain, or tissue loss.  He is on Plavix and statin daily.  He denies tobacco use. He is also followed for carotid artery stenosis.  He does not have any strokelike symptoms including slurring speech, changes in vision, or one-sided weakness.  Past Medical History:  Diagnosis Date   Adenomatous colon polyp 03/2009   Last colonoscopy by Dr. [name]    Adenomatous polyp 2010   Adenomatous polyp of colon 11/03/2010   Arthritis    Barrett's esophagus    CAD (coronary artery disease)    Complication of anesthesia    has a shortened esophogus due to CA.    COPD (chronic obstructive pulmonary disease) (HCC)    Severe emphysema per CT   Diverticulosis    Emphysema of lung (HCC)    Esophageal carcinoma ([place]) 03/2009   T1N1M0   GERD (gastroesophageal reflux disease)    Glaucoma    History of Doppler ultrasound 11/09/2011   03/2014- 50-69% L ICA stenosis;carotid doppler; L bulb/prox ICA 0-49% diameter reduction; L vertebral artery - occlusive ds; L ECA  demonstrates severe amount of fibrous plaque   History of Doppler ultrasound 11/09/2011   LEAs; R ABI - mod art. insuff.; L ABI normal at rest; R SFA - occlusive ds; L SFA - occlusive ds; patent fem-pop graft   History of echocardiogram 08/27/2009   EF >55%   History of hiatal hernia    History of kidney stones 1965   History of nuclear stress test 11/24/2011   lexiscan; normal perfusion; low risk scan;  non-diagnostic for ischemia   Hyperlipidemia    Hypertension    Left carotid artery stenosis 04/08/2014   Pneumonia    Pre-diabetes    Pulmonary nodule, right 04/08/2014   2.8 mm-incidental finding on CT   PVD (peripheral vascular disease) ([place])    Tachyarrhythmia 1999   Status post ablation at [facility]   Tobacco abuse     Past Surgical History:  Procedure Laterality Date   ABDOMINAL AORTOGRAM W/LOWER EXTREMITY N/A 04/01/2021   Procedure: ABDOMINAL AORTOGRAM W/LOWER EXTREMITY;  Surgeon: [name], MD;  Location: [place] [facility];  Service: Cardiovascular;  Laterality: N/A;   ABDOMINAL AORTOGRAM W/LOWER EXTREMITY N/A 09/01/2022   Procedure: ABDOMINAL AORTOGRAM W/LOWER EXTREMITY;  Surgeon: [name], MD;  Location: [place] [facility];  Service: Cardiovascular;  Laterality: N/A;   BIOPSY  10/24/2017   Procedure: BIOPSY;  Surgeon: [name], MD;  [facility];  Service: Endoscopy;;  esophagus   BIOPSY  10/22/2021   Procedure: BIOPSY;  Surgeon: [name], MD;  [facility];  Service: Endoscopy;;   CATARACT EXTRACTION Bilateral    COLONOSCOPY  03/17/2009   Dr.Rourk- normal rectum, sigmoid diverticula, some pale sigmoid mucosa with diffuse petechiae. pedunculated polyp at the splenic flexure, remainder of colonic mucosa appeared normal. bx= adenomatous polyp   COLONOSCOPY  04/19/2003   Dr.Rehman- few diverticu;a at the sigmoid colon,  3 small polyps, one at the transverse colon and 2 at the sigmoid, small external hemorrhoids. bx report not available.    COLONOSCOPY WITH PROPOFOL N/A 10/24/2017   Dr. [name]: Diverticulosis, 11 mm polyp at the ileocecal valve, tubular adenoma.  Repeat colonoscopy in 3 years   COLONOSCOPY WITH PROPOFOL N/A 10/22/2021   Procedure: COLONOSCOPY WITH PROPOFOL;  Surgeon: [name], MD;  [facility];  Service: Endoscopy;  Laterality: N/A;  11:00am   CORONARY ARTERY BYPASS GRAFT  1998   [name]    ENDARTERECTOMY FEMORAL Right 04/13/2021   Procedure: RIGHT ILIOFEMORAL ARTERY ENDARTERECTOMY WITH PATCH ANGIOPLASTY USING HEMASHIELD PALTINUM FINESSE DACRON PATCH;  Surgeon: [name], MD;  Location: [place];  Service: Vascular;  Laterality: Right;   ESOPHAGECTOMY  2010   [facility] Dr. [name]   ESOPHAGOGASTRODUODENOSCOPY  11/25/2010   Dr.Rourk- s/p esophagectomy with gastric pull-up, esophageal erosions straddling the surgical anastomosis, salmon colored epithelium coming up a good centimeter to a centimeter and a half above the suture line, islands of salmon colored epithelium in the most poximal residual esophagus, remainder of gastric mucosa appeared normal. bx= swamous &gastric glandular mucosa w/chronic active inflammation   ESOPHAGOGASTRODUODENOSCOPY  03/17/2009   Dr.Rourk- 4cm segment of salmon-colored epithlium distal esophagus suspicious for barretts esophagus. area of suspicious nodularity w/in this segment bx seperately. small to moderate size hiatal hernia, o/w normal stomach D1 and D2 bx=adenocarcinoma   ESOPHAGOGASTRODUODENOSCOPY (EGD) WITH PROPOFOL N/A 10/24/2017   Dr. [name]: Remnant of esophagus with anastomosis with stomach at 24 cm from the incisors, 2 cm above the anastomosis he was found to have Barrett's but no dysplasia.  Advised for repeat EGD in 3 years   ESOPHAGOGASTRODUODENOSCOPY (EGD) WITH PROPOFOL N/A 10/22/2021   Procedure: ESOPHAGOGASTRODUODENOSCOPY (EGD) WITH PROPOFOL;  Surgeon: [name], MD;  [facility];  Service: Endoscopy;  Laterality: N/A;   EYE SURGERY     cataract   FEMORAL-POPLITEAL BYPASS GRAFT  1993   occlusive ds in R SFA   FEMORAL-POPLITEAL BYPASS GRAFT Left 05/11/2021   Procedure: LEFT FEMORAL-BELOW KNEE POPLITEAL ARTERY BYPASS GRAFT WITH 6 mm PTFE and COMPLETION ANGIOGRAM;  Surgeon: [name], MD;  Location: [name];  Service: Vascular;  Laterality: Left;   GASTRIC PULL THROUGH  2010   With esophagectomy   INSERTION OF  ILIAC STENT Bilateral 04/13/2021   Procedure: INSERTION OF BILATERAL COMMON ILIAC KISSING STENTS AND INSERION OF RIGHT EXTERNAL ILIAC STENT;  Surgeon: [name], MD;  Location: [place];  Service: Vascular;  Laterality: Bilateral;   JOINT REPLACEMENT     LIPOMA EXCISION  2010   LOWER EXTREMITY ANGIOGRAM Left 04/13/2021   Procedure: LEFT LOWER EXTREMITY ANGIOGRAM ;  Surgeon: [name], MD;  Location: [place];  Service: Vascular;  Laterality: Left;   PERIPHERAL VASCULAR INTERVENTION  04/01/2021   Procedure: PERIPHERAL VASCULAR INTERVENTION;  Surgeon: [name], MD;  Location: [place] [facility];  Service: Cardiovascular;;   PERIPHERAL VASCULAR INTERVENTION Right 09/01/2022   Procedure: PERIPHERAL VASCULAR INTERVENTION;  Surgeon: [name], MD;  Location: [place] [facility];  Service: Cardiovascular;  Laterality: Right;  right external iliac   POLYPECTOMY  10/24/2017   Procedure: POLYPECTOMY;  Surgeon: [name], MD;  [facility];  Service: Endoscopy;;  colon    POLYPECTOMY  10/22/2021   Procedure: POLYPECTOMY;  Surgeon: [name], MD;  [facility];  Service: Endoscopy;;   TOTAL HIP ARTHROPLASTY Left  01/30/2019   Procedure: TOTAL HIP ARTHROPLASTY ANTERIOR APPROACH;  Surgeon: Renette Butters, MD;  Location: [facility];  Service: Orthopedics;  Laterality: Left;   ULTRASOUND GUIDANCE FOR VASCULAR ACCESS Left 04/13/2021   Procedure: ULTRASOUND GUIDANCE FOR VASCULAR ACCESS, left femoral;  Surgeon: [name], MD;  Location: [facility];  Service: Vascular;  Laterality: Left;    Social History   Socioeconomic History   Marital status: Married    Spouse name: Not on file   Number of children: 2   Years of education: Not on file   Highest education level: Not on file  Occupational History   Occupation: retired    Fish farm manager: RETIRED    Comment: truck driver  Tobacco Use   Smoking status: Former    Packs/day: 2.00    Years: 40.00    Additional  pack years: 0.00    Total pack years: 80.00    Types: Cigarettes    Start date: 27    Quit date: 07/20/1995    Years since quitting: 27.2    Passive exposure: Never   Smokeless tobacco: Never  Vaping Use   Vaping Use: Never used  Substance and Sexual Activity   Alcohol use: No    Alcohol/week: 0.0 standard drinks of alcohol   Drug use: No   Sexual activity: Yes    Partners: Female    Birth control/protection: Condom    Comment: friend  Other Topics Concern   Not on file  Social History Narrative   Not on file   Social Determinants of Health   Financial Resource Strain: Not on file  Food Insecurity: Not on file  Transportation Needs: Not on file  Physical Activity: Not on file  Stress: Not on file  Social Connections: Not on file  Intimate Partner Violence: Not on file    Family History  Problem Relation Age of Onset   GER disease Mother    Coronary artery disease Brother    Congenital heart disease Sister    Colon cancer Neg Hx     Current Outpatient Medications  Medication Sig Dispense Refill   acetaminophen (TYLENOL) 500 MG tablet Take 1,000 mg by mouth daily as needed for headache.     albuterol (PROVENTIL) (2.5 MG/3ML) 0.083% nebulizer solution Take 3 mLs (2.5 mg total) by nebulization every 6 (six) hours as needed for wheezing or shortness of breath. DX: J44.9 75 mL 6   albuterol (VENTOLIN HFA) 108 (90 Base) MCG/ACT inhaler Inhale 2 puffs into the lungs every 6 (six) hours as needed for wheezing or shortness of breath.     ALPRAZolam (XANAX) 0.5 MG tablet Take 1 tablet (0.5 mg total) by mouth at bedtime. 30 tablet 0   amLODipine (NORVASC) 5 MG tablet Take 1 tablet (5 mg total) by mouth daily. 90 tablet 3   aspirin EC 81 MG tablet Take 1 tablet (81 mg total) by mouth daily. Swallow whole. 150 tablet 2   baclofen (LIORESAL) 10 MG tablet Take 1 tablet (10 mg total) by mouth 3 (three) times daily as needed for muscle spasms. 20 each 0   clopidogrel (PLAVIX) 75 MG  tablet TAKE ONE (1) TABLET BY MOUTH EVERY DAY 90 tablet 0   dorzolamide (TRUSOPT) 2 % ophthalmic solution Place 1 drop into both eyes 2 (two) times daily.     furosemide (LASIX) 40 MG tablet Take 40 mg by mouth daily as needed for fluid.     gabapentin (NEURONTIN) 100 MG capsule Take 100 mg by  mouth 3 (three) times daily as needed for pain.     guaiFENesin (MUCINEX) 600 MG 12 hr tablet Take 600 mg by mouth every 12 (twelve) hours as needed for to loosen phlegm.     lansoprazole (PREVACID) 30 MG capsule Take 1 capsule (30 mg total) by mouth daily before supper. 90 capsule 3   latanoprost (XALATAN) 0.005 % ophthalmic solution Place 1 drop into both eyes at bedtime.      losartan (COZAAR) 100 MG tablet TAKE ONE TABLET BY MOUTH EVERY NIGHT AT BEDTIME 90 tablet 3   metoprolol succinate (TOPROL-XL) 50 MG 24 hr tablet TAKE ONE (1) TABLET BY MOUTH EVERY DAY 90 tablet 1   montelukast (SINGULAIR) 10 MG tablet Take 1 tablet (10 mg total) by mouth daily. 90 tablet 3   Multiple Vitamins-Minerals (CENTRUM SILVER PO) Take 1 tablet by mouth daily.     nitroGLYCERIN (NITROSTAT) 0.4 MG SL tablet Place 1 tablet (0.4 mg total) under the tongue every 5 (five) minutes as needed for chest pain. 25 tablet 0   rosuvastatin (CRESTOR) 20 MG tablet Take 1 tablet (20 mg total) by mouth daily. 30 tablet 3   sucralfate (CARAFATE) 1 g tablet Take 1 tablet (1 g total) by mouth 4 (four) times daily -  with meals and at bedtime. As needed for esophageal pain, heartburn. You can crush in couple of tablespoons of water to make suspension. 30 tablet 0   umeclidinium-vilanterol (ANORO ELLIPTA) 62.5-25 MCG/ACT AEPB Inhale 1 puff into the lungs daily. (Patient taking differently: Inhale 1 puff into the lungs daily as needed (Shortness for breath).) 60 each 5   No current facility-administered medications for this visit.    Allergies  Allergen Reactions   Prednisone Other (See Comments)    "throat broke out" sore mouth Take lately and  did not have any problems    Altace [Ramipril] Cough     REVIEW OF SYSTEMS:   [X]  denotes positive finding, [ ]  denotes negative finding Cardiac  Comments:  Chest pain or chest pressure:    Shortness of breath upon exertion:    Short of breath when lying flat:    Irregular heart rhythm:        Vascular    Pain in calf, thigh, or hip brought on by ambulation:    Pain in feet at night that wakes you up from your sleep:     Blood clot in your veins:    Leg swelling:         Pulmonary    Oxygen at home:    Productive cough:     Wheezing:         Neurologic    Sudden weakness in arms or legs:     Sudden numbness in arms or legs:     Sudden onset of difficulty speaking or slurred speech:    Temporary loss of vision in one eye:     Problems with dizziness:         Gastrointestinal    Blood in stool:     Vomited blood:         Genitourinary    Burning when urinating:     Blood in urine:        Psychiatric    Major depression:         Hematologic    Bleeding problems:    Problems with blood clotting too easily:        Skin    Rashes or ulcers:  Constitutional    Fever or chills:      PHYSICAL EXAMINATION:  Vitals:   10/08/22 1048  BP: (!) 169/71  Pulse: 63  Resp: 20  Temp: 97.9 F (36.6 C)  SpO2: 97%  Weight: 156 lb (70.8 kg)  Height: 5\' 8"  (1.727 m)    General:  WDWN in NAD; vital signs documented above Gait: Not observed HENT: WNL, normocephalic Pulmonary: normal non-labored breathing , without Rales, rhonchi,  wheezing Cardiac: regular HR Abdomen: soft, NT, no masses Skin: without rashes Vascular Exam/Pulses: Palpable left DP; symmetrical 2+ femoral pulses; absent right pedal pulses Extremities: without ischemic changes, without Gangrene , without cellulitis; without open wounds;  Musculoskeletal: no muscle wasting or atrophy  Neurologic: A&O X 3;  No focal weakness or paresthesias are detected Psychiatric:  The pt has Normal  affect.   Non-Invasive Vascular Imaging:    Right Stent(s): CIA  +---------------+--------+--------+----------+--------+  CIA stemt      PSV cm/sStenosisWaveform  Comments  +---------------+--------+--------+----------+--------+  Prox to Stent  59              biphasic  dampened  +---------------+--------+--------+----------+--------+  Proximal Stent 79              biphasic            +---------------+--------+--------+----------+--------+  Mid Stent      74              biphasic            +---------------+--------+--------+----------+--------+  Distal Stent   79              biphasic            +---------------+--------+--------+----------+--------+  Distal to Stent86              monophasicbrisk     +---------------+--------+--------+----------+--------+   Distal Aorta 53 cm/s monophasic waveform   Right Stent EIA  +---------------+---+---------------+----------+-----+  Prox to Stent                              nv     +---------------+---+---------------+----------+-----+  Proximal Stent 61                monophasic       +---------------+---+---------------+----------+-----+  Mid Stent      194               biphasic         +---------------+---+---------------+----------+-----+  Distal Stent   28550-99% stenosisbiphasic         +---------------+---+---------------+----------+-----+  Distal to Stent92                monophasicbrisk  +---------------+---+---------------+----------+-----+      Left Stent(s): EIA  +--------------+--------+--------+--------+--------+               PSV cm/sStenosisWaveformComments  +--------------+--------+--------+--------+--------+  Prox to Stent 140             biphasic          +--------------+--------+--------+--------+--------+  Proximal Stent130             biphasic          +--------------+--------+--------+--------+--------+  Mid Stent     172              biphasic          +--------------+--------+--------+--------+--------+  Distal Stent  197             biphasic          +--------------+--------+--------+--------+--------+  Left CFA 55 cm/s biphasic waveform   Left stent CIA  +---------------+--------+--------+----------+--------+                PSV cm/sStenosisWaveform  Comments  +---------------+--------+--------+----------+--------+  Prox to Stent  213             monophasicbrisk     +---------------+--------+--------+----------+--------+  Proximal Stent 88              biphasic            +---------------+--------+--------+----------+--------+  Mid Stent      87              biphasic            +---------------+--------+--------+----------+--------+  Distal Stent   121             biphasic            +---------------+--------+--------+----------+--------+  Distal to Stent95              biphasic            +---------------+--------+--------+----------+--------+   ASSESSMENT/PLAN:: 77 y.o. male here for follow up for surveillance of PAD and carotid artery stenosis. Most recent intervention was RLE angiogram for elevated velocities within the right external iliac stents. This resulted in balloon angioplasty and stent extension distally for primary assisted patency.   ABI non compressible, some decrease in toe pressure. On physical exam he had readily palpable pulses in bilateral common femoral arteries, excellent DP pulse in the left foot.   Carotids will be checked at the one year mark.   Velocities at the right external iliac remain elevated - will observe at this time with close interval follow up (3 months). Left leg bypass must be insonated at that time as well to ensure it remains widely patent.    [name], MD [facility] [phone] Total time of patient care including pre-visit research, consultation, and documentation greater than 40  minutes

## 2022-10-13 ENCOUNTER — Other Ambulatory Visit: Payer: Self-pay

## 2022-10-13 DIAGNOSIS — I739 Peripheral vascular disease, unspecified: Secondary | ICD-10-CM

## 2022-10-13 DIAGNOSIS — I70222 Atherosclerosis of native arteries of extremities with rest pain, left leg: Secondary | ICD-10-CM

## 2022-10-13 DIAGNOSIS — Z95828 Presence of other vascular implants and grafts: Secondary | ICD-10-CM

## 2022-11-03 ENCOUNTER — Ambulatory Visit: Payer: Medicare Other | Attending: Cardiology | Admitting: Cardiology

## 2022-11-03 ENCOUNTER — Encounter: Payer: Self-pay | Admitting: Cardiology

## 2022-11-03 VITALS — BP 150/80 | HR 60 | Ht 68.0 in | Wt 158.2 lb

## 2022-11-03 DIAGNOSIS — I25118 Atherosclerotic heart disease of native coronary artery with other forms of angina pectoris: Secondary | ICD-10-CM

## 2022-11-03 DIAGNOSIS — E782 Mixed hyperlipidemia: Secondary | ICD-10-CM

## 2022-11-03 DIAGNOSIS — I10 Essential (primary) hypertension: Secondary | ICD-10-CM

## 2022-11-03 DIAGNOSIS — Z79899 Other long term (current) drug therapy: Secondary | ICD-10-CM

## 2022-11-03 DIAGNOSIS — R002 Palpitations: Secondary | ICD-10-CM

## 2022-11-03 MED ORDER — SPIRONOLACTONE 25 MG PO TABS: 12.5000 mg | ORAL_TABLET | Freq: Every day | ORAL | 3 refills | Status: DC

## 2022-11-03 NOTE — Progress Notes (Signed)
Clinical Summary Mr. Roser is a 77 y.o.male seen today for follow up of the following medical problems.  Previously followed by Dr Tresa Endo   1.CAD - He is s/p CABG in 1998 with low-risk NST in 07/2013 - 01/2021 nuclear stress: mild apical inferior ischemia, low risk study - 12/2020 echo: LVEF 55-60%, no WMAs, grade Idd   - ER eval Jan 2023 with chest pain, thought to be GERD. Trops neg, EKG without acute ischemic changes. Symptoms improved on antacid   - no chest pains - some SOB, mixed compliance with inhalers. Has some intermittent wheezing       2. Palpitations - episode 1 week ago felt like heart was racing.  - woke him up from sleep -episode last 15-20 minutes.  - no recurrent episodes.  - coffee x 2 cups, no sodas, rare tea, no energy drinks, no EtOH  - no significant palpitations.        3, Dizziness - per his report had low bp's with standing. - ongonig symptoms. Episode just yesterday. Was climbing a ladder. Suddenly felt lightheaded, nauseous. Sat down and rested.  - drinks 2-3 water bottles a day, 2 cups of coffee. Has prn lasix has not taken in months. Seldomly takes muscle releaxer, takes gabapentin few times a week.    - no recent symptoms.    4. Syncope - admission 12/2020 with syncope - thought to be due to hypovolemia/dehydration, was hypotesiven SBPs to 60s resolved with IVFs. Aldactone was stopped at that time.   - no recent symptoms     5. PAD - He is s/p fem-pop bypass in 1993 and known occluded right SFA.  - prior bilateral common iliac artery stenting, bilateral external iliac artery stenting. - 03/2021 right iliofemoral endarectomy - 04/2021 left femoral below knee pop bypass - 08/2022 angioplasty right external iliac artery stent, covered stenting right exerternal iliac  - on extended DAPT managed by vascular   6. HTN - compliant with meds    7. Hyperliidemia - 10/2020 TC 90 TG 94 HDL 41 LDL 31 - Jan 2024 TC 123 TG 148 HDL 43 LDL 55    8. Severe COPD - followed by pulmionary - mixed compliance with inhalers   8. History of esopaghael cancer    10. LE edema - has prn lasix, has not needed   Past Medical History:  Diagnosis Date   Adenomatous colon polyp 03/2009   Last colonoscopy by Dr. Jena Gauss    Adenomatous polyp 2010   Adenomatous polyp of colon 11/03/2010   Arthritis    Barrett's esophagus    CAD (coronary artery disease)    Complication of anesthesia    has a shortened esophogus due to CA.    COPD (chronic obstructive pulmonary disease) (HCC)    Severe emphysema per CT   Diverticulosis    Emphysema of lung (HCC)    Esophageal carcinoma (HCC) 03/2009   T1N1M0   GERD (gastroesophageal reflux disease)    Glaucoma    History of Doppler ultrasound 11/09/2011   03/2014- 50-69% L ICA stenosis;carotid doppler; L bulb/prox ICA 0-49% diameter reduction; L vertebral artery - occlusive ds; L ECA  demonstrates severe amount of fibrous plaque   History of Doppler ultrasound 11/09/2011   LEAs; R ABI - mod art. insuff.; L ABI normal at rest; R SFA - occlusive ds; L SFA - occlusive ds; patent fem-pop graft   History of echocardiogram 08/27/2009   EF >55%   History of  hiatal hernia    History of kidney stones 1965   History of nuclear stress test 11/24/2011   lexiscan; normal perfusion; low risk scan; non-diagnostic for ischemia   Hyperlipidemia    Hypertension    Left carotid artery stenosis 04/08/2014   Pneumonia    Pre-diabetes    Pulmonary nodule, right 04/08/2014   2.8 mm-incidental finding on CT   PVD (peripheral vascular disease) (HCC)    Tachyarrhythmia 1999   Status post ablation at [facility]   Tobacco abuse      Allergies  Allergen Reactions   Prednisone Other (See Comments)    "throat broke out" sore mouth Take lately and did not have any problems    Altace [Ramipril] Cough     Current Outpatient Medications  Medication Sig Dispense Refill   acetaminophen (TYLENOL) 500 MG tablet Take 1,000  mg by mouth daily as needed for headache.     albuterol (PROVENTIL) (2.5 MG/3ML) 0.083% nebulizer solution Take 3 mLs (2.5 mg total) by nebulization every 6 (six) hours as needed for wheezing or shortness of breath. DX: J44.9 75 mL 6   albuterol (VENTOLIN HFA) 108 (90 Base) MCG/ACT inhaler Inhale 2 puffs into the lungs every 6 (six) hours as needed for wheezing or shortness of breath.     ALPRAZolam (XANAX) 0.5 MG tablet Take 1 tablet (0.5 mg total) by mouth at bedtime. 30 tablet 0   amLODipine (NORVASC) 5 MG tablet Take 1 tablet (5 mg total) by mouth daily. 90 tablet 3   aspirin EC 81 MG tablet Take 1 tablet (81 mg total) by mouth daily. Swallow whole. 150 tablet 2   baclofen (LIORESAL) 10 MG tablet Take 1 tablet (10 mg total) by mouth 3 (three) times daily as needed for muscle spasms. 20 each 0   clopidogrel (PLAVIX) 75 MG tablet TAKE ONE (1) TABLET BY MOUTH EVERY DAY 90 tablet 0   dorzolamide (TRUSOPT) 2 % ophthalmic solution Place 1 drop into both eyes 2 (two) times daily.     furosemide (LASIX) 40 MG tablet Take 40 mg by mouth daily as needed for fluid.     gabapentin (NEURONTIN) 100 MG capsule Take 100 mg by mouth 3 (three) times daily as needed for pain.     guaiFENesin (MUCINEX) 600 MG 12 hr tablet Take 600 mg by mouth every 12 (twelve) hours as needed for to loosen phlegm.     lansoprazole (PREVACID) 30 MG capsule Take 1 capsule (30 mg total) by mouth daily before supper. 90 capsule 3   latanoprost (XALATAN) 0.005 % ophthalmic solution Place 1 drop into both eyes at bedtime.      losartan (COZAAR) 100 MG tablet TAKE ONE TABLET BY MOUTH EVERY NIGHT AT BEDTIME 90 tablet 3   metoprolol succinate (TOPROL-XL) 50 MG 24 hr tablet TAKE ONE (1) TABLET BY MOUTH EVERY DAY 90 tablet 1   montelukast (SINGULAIR) 10 MG tablet Take 1 tablet (10 mg total) by mouth daily. 90 tablet 3   Multiple Vitamins-Minerals (CENTRUM SILVER PO) Take 1 tablet by mouth daily.     nitroGLYCERIN (NITROSTAT) 0.4 MG SL tablet  Place 1 tablet (0.4 mg total) under the tongue every 5 (five) minutes as needed for chest pain. 25 tablet 0   rosuvastatin (CRESTOR) 20 MG tablet Take 1 tablet (20 mg total) by mouth daily. 30 tablet 3   sucralfate (CARAFATE) 1 g tablet Take 1 tablet (1 g total) by mouth 4 (four) times daily -  with meals  and at bedtime. As needed for esophageal pain, heartburn. You can crush in couple of tablespoons of water to make suspension. 30 tablet 0   umeclidinium-vilanterol (ANORO ELLIPTA) 62.5-25 MCG/ACT AEPB Inhale 1 puff into the lungs daily. (Patient taking differently: Inhale 1 puff into the lungs daily as needed (Shortness for breath).) 60 each 5   No current facility-administered medications for this visit.     Past Surgical History:  Procedure Laterality Date   ABDOMINAL AORTOGRAM W/LOWER EXTREMITY N/A 04/01/2021   Procedure: ABDOMINAL AORTOGRAM W/LOWER EXTREMITY;  Surgeon: Victorino Sparrow, MD;  Location: [facility] INVASIVE CV LAB;  Service: Cardiovascular;  Laterality: N/A;   ABDOMINAL AORTOGRAM W/LOWER EXTREMITY N/A 09/01/2022   Procedure: ABDOMINAL AORTOGRAM W/LOWER EXTREMITY;  Surgeon: Victorino Sparrow, MD;  Location: [facility] INVASIVE CV LAB;  Service: Cardiovascular;  Laterality: N/A;   BIOPSY  10/24/2017   Procedure: BIOPSY;  Surgeon: Corbin Ade, MD;  Location: [facility];  Service: Endoscopy;;  esophagus   BIOPSY  10/22/2021   Procedure: BIOPSY;  Surgeon: Corbin Ade, MD;  Location: [facility];  Service: Endoscopy;;   CATARACT EXTRACTION Bilateral    COLONOSCOPY  03/17/2009   Dr.Rourk- normal rectum, sigmoid diverticula, some pale sigmoid mucosa with diffuse petechiae. pedunculated polyp at the splenic flexure, remainder of colonic mucosa appeared normal. bx= adenomatous polyp   COLONOSCOPY  04/19/2003   Dr.Rehman- few diverticu;a at the sigmoid colon, 3 small polyps, one at the transverse colon and 2 at the sigmoid, small external hemorrhoids. bx report not available.    COLONOSCOPY  WITH PROPOFOL N/A 10/24/2017   Dr. Jena Gauss: Diverticulosis, 11 mm polyp at the ileocecal valve, tubular adenoma.  Repeat colonoscopy in 3 years   COLONOSCOPY WITH PROPOFOL N/A 10/22/2021   Procedure: COLONOSCOPY WITH PROPOFOL;  Surgeon: Corbin Ade, MD;  Location: [facility];  Service: Endoscopy;  Laterality: N/A;  11:00am   CORONARY ARTERY BYPASS GRAFT  1998   [name]   ENDARTERECTOMY FEMORAL Right 04/13/2021   Procedure: RIGHT ILIOFEMORAL ARTERY ENDARTERECTOMY WITH PATCH ANGIOPLASTY USING HEMASHIELD PALTINUM FINESSE DACRON PATCH;  Surgeon: Victorino Sparrow, MD;  Location: [facility] OR;  Service: Vascular;  Laterality: Right;   ESOPHAGECTOMY  2010   [facility] Dr. [name]   ESOPHAGOGASTRODUODENOSCOPY  11/25/2010   Dr.Rourk- s/p esophagectomy with gastric pull-up, esophageal erosions straddling the surgical anastomosis, salmon colored epithelium coming up a good centimeter to a centimeter and a half above the suture line, islands of salmon colored epithelium in the most poximal residual esophagus, remainder of gastric mucosa appeared normal. bx= swamous &gastric glandular mucosa w/chronic active inflammation   ESOPHAGOGASTRODUODENOSCOPY  03/17/2009   Dr.Rourk- 4cm segment of salmon-colored epithlium distal esophagus suspicious for barretts esophagus. area of suspicious nodularity w/in this segment bx seperately. small to moderate size hiatal hernia, o/w normal stomach D1 and D2 bx=adenocarcinoma   ESOPHAGOGASTRODUODENOSCOPY (EGD) WITH PROPOFOL N/A 10/24/2017   Dr. Jena Gauss: Remnant of esophagus with anastomosis with stomach at 24 cm from the incisors, 2 cm above the anastomosis he was found to have Barrett's but no dysplasia.  Advised for repeat EGD in 3 years   ESOPHAGOGASTRODUODENOSCOPY (EGD) WITH PROPOFOL N/A 10/22/2021   Procedure: ESOPHAGOGASTRODUODENOSCOPY (EGD) WITH PROPOFOL;  Surgeon: Corbin Ade, MD;  Location: [facility];  Service: Endoscopy;  Laterality: N/A;   EYE SURGERY      cataract   FEMORAL-POPLITEAL BYPASS GRAFT  1993   occlusive ds in R SFA   FEMORAL-POPLITEAL BYPASS GRAFT Left 05/11/2021  Procedure: LEFT FEMORAL-BELOW KNEE POPLITEAL ARTERY BYPASS GRAFT WITH 6 mm PTFE and COMPLETION ANGIOGRAM;  Surgeon: Victorino Sparrow, MD;  Location: [facility] OR;  Service: Vascular;  Laterality: Left;   GASTRIC PULL THROUGH  2010   With esophagectomy   INSERTION OF ILIAC STENT Bilateral 04/13/2021   Procedure: INSERTION OF BILATERAL COMMON ILIAC KISSING STENTS AND INSERION OF RIGHT EXTERNAL ILIAC STENT;  Surgeon: Victorino Sparrow, MD;  Location: [facility] OR;  Service: Vascular;  Laterality: Bilateral;   JOINT REPLACEMENT     LIPOMA EXCISION  2010   LOWER EXTREMITY ANGIOGRAM Left 04/13/2021   Procedure: LEFT LOWER EXTREMITY ANGIOGRAM ;  Surgeon: Victorino Sparrow, MD;  Location: [facility] OR;  Service: Vascular;  Laterality: Left;   PERIPHERAL VASCULAR INTERVENTION  04/01/2021   Procedure: PERIPHERAL VASCULAR INTERVENTION;  Surgeon: Victorino Sparrow, MD;  Location: [facility] INVASIVE CV LAB;  Service: Cardiovascular;;   PERIPHERAL VASCULAR INTERVENTION Right 09/01/2022   Procedure: PERIPHERAL VASCULAR INTERVENTION;  Surgeon: Victorino Sparrow, MD;  Location: [facility] INVASIVE CV LAB;  Service: Cardiovascular;  Laterality: Right;  right external iliac   POLYPECTOMY  10/24/2017   Procedure: POLYPECTOMY;  Surgeon: Corbin Ade, MD;  Location: [facility];  Service: Endoscopy;;  colon    POLYPECTOMY  10/22/2021   Procedure: POLYPECTOMY;  Surgeon: Corbin Ade, MD;  Location: [facility];  Service: Endoscopy;;   TOTAL HIP ARTHROPLASTY Left 01/30/2019   Procedure: TOTAL HIP ARTHROPLASTY ANTERIOR APPROACH;  Surgeon: Sheral Apley, MD;  Location: [facility] ORS;  Service: Orthopedics;  Laterality: Left;   ULTRASOUND GUIDANCE FOR VASCULAR ACCESS Left 04/13/2021   Procedure: ULTRASOUND GUIDANCE FOR VASCULAR ACCESS, left femoral;  Surgeon: Victorino Sparrow, MD;  Location: [facility] OR;  Service: Vascular;  Laterality:  Left;     Allergies  Allergen Reactions   Prednisone Other (See Comments)    "throat broke out" sore mouth Take lately and did not have any problems    Altace [Ramipril] Cough      Family History  Problem Relation Age of Onset   GER disease Mother    Coronary artery disease Brother    Congenital heart disease Sister    Colon cancer Neg Hx      Social History Mr. Grantham reports that he quit smoking about 27 years ago. His smoking use included cigarettes. He started smoking about 67 years ago. He has a 80.00 pack-year smoking history. He has never been exposed to tobacco smoke. He has never used smokeless tobacco. Mr. Bochicchio reports no history of alcohol use.   Review of Systems CONSTITUTIONAL: No weight loss, fever, chills, weakness or fatigue.  HEENT: Eyes: No visual loss, blurred vision, double vision or yellow sclerae.No hearing loss, sneezing, congestion, runny nose or sore throat.  SKIN: No rash or itching.  CARDIOVASCULAR: per hpi RESPIRATORY: per hpi  GASTROINTESTINAL: No anorexia, nausea, vomiting or diarrhea. No abdominal pain or blood.  GENITOURINARY: No burning on urination, no polyuria NEUROLOGICAL: No headache, dizziness, syncope, paralysis, ataxia, numbness or tingling in the extremities. No change in bowel or bladder control.  MUSCULOSKELETAL: No muscle, back pain, joint pain or stiffness.  LYMPHATICS: No enlarged nodes. No history of splenectomy.  PSYCHIATRIC: No history of depression or anxiety.  ENDOCRINOLOGIC: No reports of sweating, cold or heat intolerance. No polyuria or polydipsia.  Marland Kitchen   Physical Examination Today's Vitals   11/03/22 0907  BP: (!) 150/80  Pulse: 60  Weight: 158 lb 3.2 oz (71.8 kg)  Height: 5'  8" (1.727 m)   Body mass index is 24.05 kg/m.  Gen: resting comfortably, no acute distress HEENT: no scleral icterus, pupils equal round and reactive, no palptable cervical adenopathy,  CV: RRR, no m/r/g, no jvd. +bialteral carotid  bruits Resp: bilateral wheezing GI: abdomen is soft, non-tender, non-distended, normal bowel sounds, no hepatosplenomegaly MSK: extremities are warm, no edema.  Skin: warm, no rash Neuro:  no focal deficits Psych: appropriate affect   Diagnostic Studies 01/2021 nuclear stress The left ventricular ejection fraction is normal (55-65%). Nuclear stress EF: 65%. There was no ST segment deviation noted during stress. No T wave inversion was noted during stress. Defect 1: There is a small defect of mild severity present in the apical inferior location. Findings consistent with a small region of ischemia. This is a low risk study.    Assessment and Plan   CAD  - 01/2021 nuclear stress was low risk, though very small area of ischemia -no recent cardiac symptoms, recent SOB and wheezing more pulmonary due to mixed inhaler compliance - continue current meds   2. Hyperlipidemia - he is at goal, continue current meds   3. HTN - above goal. Careful management given prior orthostatic symptoms - start aldactone 12.5mg  daily, bmet 2 weeks.    4. Palpitations _ no symptoms, continue current meds      Antoine Poche, M.D.

## 2022-11-03 NOTE — Patient Instructions (Signed)
Medication Instructions:  Your physician has recommended you make the following change in your medication:   -Start Spironolactone (Aldactone) 12.5 mg tablet once daily.  *If you need a refill on your cardiac medications before your next appointment, please call your pharmacy*   Lab Work:  IN 2 WEEKS:  -BMET  If you have labs (blood work) drawn today and your tests are completely normal, you will receive your results only by: MyChart Message (if you have MyChart) OR A paper copy in the mail If you have any lab test that is abnormal or we need to change your treatment, we will call you to review the results.   Testing/Procedures: None   Follow-Up: At [facility], you and your health needs are our priority.  As part of our continuing mission to provide you with exceptional heart care, we have created designated Provider Care Teams.  These Care Teams include your primary Cardiologist (physician) and Advanced Practice Providers (APPs -  Physician Assistants and Nurse Practitioners) who all work together to provide you with the care you need, when you need it.  We recommend signing up for the patient portal called "MyChart".  Sign up information is provided on this After Visit Summary.  MyChart is used to connect with patients for Virtual Visits (Telemedicine).  Patients are able to view lab/test results, encounter notes, upcoming appointments, etc.  Non-urgent messages can be sent to your provider as well.   To learn more about what you can do with MyChart, go to [email or web address].    Your next appointment:   35m month(s)  Provider:   Dina Rich, MD    Other Instructions

## 2022-11-19 ENCOUNTER — Other Ambulatory Visit (HOSPITAL_COMMUNITY)
Admission: RE | Admit: 2022-11-19 | Discharge: 2022-11-19 | Disposition: A | Discharge status: Home / Self Care | Payer: Medicare Other | Source: Ambulatory Visit | Attending: Cardiology | Admitting: Cardiology

## 2022-11-19 DIAGNOSIS — Z79899 Other long term (current) drug therapy: Secondary | ICD-10-CM | POA: Diagnosis not present

## 2022-11-19 LAB — BASIC METABOLIC PANEL
Anion gap: 9 (ref 5–15)
BUN: 17 mg/dL (ref 8–23)
CO2: 25 mmol/L (ref 22–32)
Calcium: 9.3 mg/dL (ref 8.9–10.3)
Chloride: 105 mmol/L (ref 98–111)
Creatinine, Ser: 0.81 mg/dL (ref 0.61–1.24)
GFR, Estimated: >60 mL/min (ref >60)
Glucose, Bld: 112 mg/dL — ABNORMAL HIGH (ref 70–99)
Potassium: 5 mmol/L (ref 3.5–5.1)
Sodium: 139 mmol/L (ref 135–145)

## 2022-11-22 DIAGNOSIS — Z8501 Personal history of malignant neoplasm of esophagus: Secondary | ICD-10-CM | POA: Diagnosis not present

## 2022-11-22 DIAGNOSIS — I10 Essential (primary) hypertension: Secondary | ICD-10-CM | POA: Diagnosis not present

## 2022-11-22 DIAGNOSIS — J449 Chronic obstructive pulmonary disease, unspecified: Secondary | ICD-10-CM | POA: Diagnosis not present

## 2022-11-28 NOTE — Progress Notes (Unsigned)
GI Office Note    Referring Provider: Benita Stabile, MD Primary Care Physician:  Benita Stabile, MD  Primary Gastroenterologist: Roetta Sessions, MD   Chief Complaint   No chief complaint on file.   History of Present Illness   Gregory Eaton is a 77 y.o. male presenting today for follow-up.  Last seen in the office February 2023.  Patient has a history of esophageal carcinoma in 2010 status post esophagectomy with gastric pull-up, Barrett's esophagus diagnosed 2019 with no dysplasia, GERD, colonic adenomas.    Colonoscopy April 2023: -Diverticulosis -two 3 to 4 mm polyps removed from the hepatic flexure -Tubular adenomas -No future colonoscopy recommended unless new symptoms develop  EGD April 2023: -Status post esophagectomy with gastric pull-up -Abnormal mucosa at anastomosis consistent with Barrett's esophagus -Esophageal biopsies with moderate chronic gastritis with foveolar hyperplasia and intestinal metaplasia.  Scant reactive squamous epithelium.  Negative for dysplasia and carcinoma.  Findings consistent with Barrett's esophagus. -No future endoscopy recommended unless new symptoms develop   Medications   Current Outpatient Medications  Medication Sig Dispense Refill   acetaminophen (TYLENOL) 500 MG tablet Take 1,000 mg by mouth daily as needed for headache.     albuterol (PROVENTIL) (2.5 MG/3ML) 0.083% nebulizer solution Take 3 mLs (2.5 mg total) by nebulization every 6 (six) hours as needed for wheezing or shortness of breath. DX: J44.9 75 mL 6   albuterol (VENTOLIN HFA) 108 (90 Base) MCG/ACT inhaler Inhale 2 puffs into the lungs every 6 (six) hours as needed for wheezing or shortness of breath.     ALPRAZolam (XANAX) 0.5 MG tablet Take 1 tablet (0.5 mg total) by mouth at bedtime. 30 tablet 0   amLODipine (NORVASC) 10 MG tablet Take 10 mg by mouth daily.     aspirin EC 81 MG tablet Take 1 tablet (81 mg total) by mouth daily. Swallow whole. 150 tablet 2    baclofen (LIORESAL) 10 MG tablet Take 1 tablet (10 mg total) by mouth 3 (three) times daily as needed for muscle spasms. 20 each 0   clopidogrel (PLAVIX) 75 MG tablet TAKE ONE (1) TABLET BY MOUTH EVERY DAY 90 tablet 0   dorzolamide (TRUSOPT) 2 % ophthalmic solution Place 1 drop into both eyes 2 (two) times daily.     dorzolamide-timolol (COSOPT) 2-0.5 % ophthalmic solution Place 1 drop into both eyes 2 (two) times daily.     furosemide (LASIX) 40 MG tablet Take 40 mg by mouth daily as needed for fluid.     gabapentin (NEURONTIN) 100 MG capsule Take 100 mg by mouth 3 (three) times daily as needed for pain.     guaiFENesin (MUCINEX) 600 MG 12 hr tablet Take 600 mg by mouth every 12 (twelve) hours as needed for to loosen phlegm.     lansoprazole (PREVACID) 30 MG capsule Take 1 capsule (30 mg total) by mouth daily before supper. 90 capsule 3   latanoprost (XALATAN) 0.005 % ophthalmic solution Place 1 drop into both eyes at bedtime.      losartan (COZAAR) 100 MG tablet TAKE ONE TABLET BY MOUTH EVERY NIGHT AT BEDTIME 90 tablet 3   metoprolol succinate (TOPROL-XL) 50 MG 24 hr tablet TAKE ONE (1) TABLET BY MOUTH EVERY DAY 90 tablet 1   montelukast (SINGULAIR) 10 MG tablet Take 1 tablet (10 mg total) by mouth daily. 90 tablet 3   Multiple Vitamins-Minerals (CENTRUM SILVER PO) Take 1 tablet by mouth daily.     nitroGLYCERIN (NITROSTAT)  0.4 MG SL tablet Place 1 tablet (0.4 mg total) under the tongue every 5 (five) minutes as needed for chest pain. 25 tablet 0   rosuvastatin (CRESTOR) 10 MG tablet Take 10 mg by mouth daily.     spironolactone (ALDACTONE) 25 MG tablet Take 0.5 tablets (12.5 mg total) by mouth daily. 45 tablet 3   sucralfate (CARAFATE) 1 g tablet Take 1 tablet (1 g total) by mouth 4 (four) times daily -  with meals and at bedtime. As needed for esophageal pain, heartburn. You can crush in couple of tablespoons of water to make suspension. 30 tablet 0   umeclidinium-vilanterol (ANORO ELLIPTA)  62.5-25 MCG/ACT AEPB Inhale 1 puff into the lungs daily. (Patient taking differently: Inhale 1 puff into the lungs daily as needed (Shortness for breath).) 60 each 5   No current facility-administered medications for this visit.    Allergies   Allergies as of 11/29/2022 - Review Complete 11/03/2022  Allergen Reaction Noted   Altace [ramipril] Cough 04/07/2014     Past Medical History   Past Medical History:  Diagnosis Date   Adenomatous colon polyp 03/2009   Last colonoscopy by Dr. Jena Gauss    Adenomatous polyp 2010   Adenomatous polyp of colon 11/03/2010   Arthritis    Barrett's esophagus    CAD (coronary artery disease)    Complication of anesthesia    has a shortened esophogus due to CA.    COPD (chronic obstructive pulmonary disease) (HCC)    Severe emphysema per CT   Diverticulosis    Emphysema of lung (HCC)    Esophageal carcinoma (HCC) 03/2009   T1N1M0   GERD (gastroesophageal reflux disease)    Glaucoma    History of Doppler ultrasound 11/09/2011   03/2014- 50-69% L ICA stenosis;carotid doppler; L bulb/prox ICA 0-49% diameter reduction; L vertebral artery - occlusive ds; L ECA  demonstrates severe amount of fibrous plaque   History of Doppler ultrasound 11/09/2011   LEAs; R ABI - mod art. insuff.; L ABI normal at rest; R SFA - occlusive ds; L SFA - occlusive ds; patent fem-pop graft   History of echocardiogram 08/27/2009   EF >55%   History of hiatal hernia    History of kidney stones 1965   History of nuclear stress test 11/24/2011   lexiscan; normal perfusion; low risk scan; non-diagnostic for ischemia   Hyperlipidemia    Hypertension    Left carotid artery stenosis 04/08/2014   Pneumonia    Pre-diabetes    Pulmonary nodule, right 04/08/2014   2.8 mm-incidental finding on CT   PVD (peripheral vascular disease) (HCC)    Tachyarrhythmia 1999   Status post ablation at [facility]   Tobacco abuse     Past Surgical History   Past Surgical History:  Procedure  Laterality Date   ABDOMINAL AORTOGRAM W/LOWER EXTREMITY N/A 04/01/2021   Procedure: ABDOMINAL AORTOGRAM W/LOWER EXTREMITY;  Surgeon: Victorino Sparrow, MD;  Location: [facility] INVASIVE CV LAB;  Service: Cardiovascular;  Laterality: N/A;   ABDOMINAL AORTOGRAM W/LOWER EXTREMITY N/A 09/01/2022   Procedure: ABDOMINAL AORTOGRAM W/LOWER EXTREMITY;  Surgeon: Victorino Sparrow, MD;  Location: [facility] INVASIVE CV LAB;  Service: Cardiovascular;  Laterality: N/A;   BIOPSY  10/24/2017   Procedure: BIOPSY;  Surgeon: Corbin Ade, MD;  Location: [facility];  Service: Endoscopy;;  esophagus   BIOPSY  10/22/2021   Procedure: BIOPSY;  Surgeon: Corbin Ade, MD;  Location: [facility];  Service: Endoscopy;;   CATARACT EXTRACTION  Bilateral    COLONOSCOPY  03/17/2009   Dr.Rourk- normal rectum, sigmoid diverticula, some pale sigmoid mucosa with diffuse petechiae. pedunculated polyp at the splenic flexure, remainder of colonic mucosa appeared normal. bx= adenomatous polyp   COLONOSCOPY  04/19/2003   Dr.Rehman- few diverticu;a at the sigmoid colon, 3 small polyps, one at the transverse colon and 2 at the sigmoid, small external hemorrhoids. bx report not available.    COLONOSCOPY WITH PROPOFOL N/A 10/24/2017   Dr. Jena Gauss: Diverticulosis, 11 mm polyp at the ileocecal valve, tubular adenoma.  Repeat colonoscopy in 3 years   COLONOSCOPY WITH PROPOFOL N/A 10/22/2021   Procedure: COLONOSCOPY WITH PROPOFOL;  Surgeon: Corbin Ade, MD;  Location: [facility];  Service: Endoscopy;  Laterality: N/A;  11:00am   CORONARY ARTERY BYPASS GRAFT  1998   [name]   ENDARTERECTOMY FEMORAL Right 04/13/2021   Procedure: RIGHT ILIOFEMORAL ARTERY ENDARTERECTOMY WITH PATCH ANGIOPLASTY USING HEMASHIELD PALTINUM FINESSE DACRON PATCH;  Surgeon: Victorino Sparrow, MD;  Location: [facility] OR;  Service: Vascular;  Laterality: Right;   ESOPHAGECTOMY  2010   [facility] Dr. [name]   ESOPHAGOGASTRODUODENOSCOPY  11/25/2010   Dr.Rourk- s/p esophagectomy  with gastric pull-up, esophageal erosions straddling the surgical anastomosis, salmon colored epithelium coming up a good centimeter to a centimeter and a half above the suture line, islands of salmon colored epithelium in the most poximal residual esophagus, remainder of gastric mucosa appeared normal. bx= swamous &gastric glandular mucosa w/chronic active inflammation   ESOPHAGOGASTRODUODENOSCOPY  03/17/2009   Dr.Rourk- 4cm segment of salmon-colored epithlium distal esophagus suspicious for barretts esophagus. area of suspicious nodularity w/in this segment bx seperately. small to moderate size hiatal hernia, o/w normal stomach D1 and D2 bx=adenocarcinoma   ESOPHAGOGASTRODUODENOSCOPY (EGD) WITH PROPOFOL N/A 10/24/2017   Dr. Jena Gauss: Remnant of esophagus with anastomosis with stomach at 24 cm from the incisors, 2 cm above the anastomosis he was found to have Barrett's but no dysplasia.  Advised for repeat EGD in 3 years   ESOPHAGOGASTRODUODENOSCOPY (EGD) WITH PROPOFOL N/A 10/22/2021   Procedure: ESOPHAGOGASTRODUODENOSCOPY (EGD) WITH PROPOFOL;  Surgeon: Corbin Ade, MD;  Location: [facility];  Service: Endoscopy;  Laterality: N/A;   EYE SURGERY     cataract   FEMORAL-POPLITEAL BYPASS GRAFT  1993   occlusive ds in R SFA   FEMORAL-POPLITEAL BYPASS GRAFT Left 05/11/2021   Procedure: LEFT FEMORAL-BELOW KNEE POPLITEAL ARTERY BYPASS GRAFT WITH 6 mm PTFE and COMPLETION ANGIOGRAM;  Surgeon: Victorino Sparrow, MD;  Location: [facility] OR;  Service: Vascular;  Laterality: Left;   GASTRIC PULL THROUGH  2010   With esophagectomy   INSERTION OF ILIAC STENT Bilateral 04/13/2021   Procedure: INSERTION OF BILATERAL COMMON ILIAC KISSING STENTS AND INSERION OF RIGHT EXTERNAL ILIAC STENT;  Surgeon: Victorino Sparrow, MD;  Location: [facility] OR;  Service: Vascular;  Laterality: Bilateral;   JOINT REPLACEMENT     LIPOMA EXCISION  2010   LOWER EXTREMITY ANGIOGRAM Left 04/13/2021   Procedure: LEFT LOWER EXTREMITY ANGIOGRAM ;   Surgeon: Victorino Sparrow, MD;  Location: [facility] OR;  Service: Vascular;  Laterality: Left;   PERIPHERAL VASCULAR INTERVENTION  04/01/2021   Procedure: PERIPHERAL VASCULAR INTERVENTION;  Surgeon: Victorino Sparrow, MD;  Location: [facility] INVASIVE CV LAB;  Service: Cardiovascular;;   PERIPHERAL VASCULAR INTERVENTION Right 09/01/2022   Procedure: PERIPHERAL VASCULAR INTERVENTION;  Surgeon: Victorino Sparrow, MD;  Location: [facility] INVASIVE CV LAB;  Service: Cardiovascular;  Laterality: Right;  right external iliac   POLYPECTOMY  10/24/2017  Procedure: POLYPECTOMY;  Surgeon: Corbin Ade, MD;  Location: [facility];  Service: Endoscopy;;  colon    POLYPECTOMY  10/22/2021   Procedure: POLYPECTOMY;  Surgeon: Corbin Ade, MD;  Location: [facility];  Service: Endoscopy;;   TOTAL HIP ARTHROPLASTY Left 01/30/2019   Procedure: TOTAL HIP ARTHROPLASTY ANTERIOR APPROACH;  Surgeon: Sheral Apley, MD;  Location: [facility] ORS;  Service: Orthopedics;  Laterality: Left;   ULTRASOUND GUIDANCE FOR VASCULAR ACCESS Left 04/13/2021   Procedure: ULTRASOUND GUIDANCE FOR VASCULAR ACCESS, left femoral;  Surgeon: Victorino Sparrow, MD;  Location: [facility] OR;  Service: Vascular;  Laterality: Left;    Past Family History   Family History  Problem Relation Age of Onset   GER disease Mother    Coronary artery disease Brother    Congenital heart disease Sister    Colon cancer Neg Hx     Past Social History   Social History   Socioeconomic History   Marital status: Married    Spouse name: Not on file   Number of children: 2   Years of education: Not on file   Highest education level: Not on file  Occupational History   Occupation: retired    Associate Professor: RETIRED    Comment: truck driver  Tobacco Use   Smoking status: Former    Packs/day: 2.00    Years: 40.00    Additional pack years: 0.00    Total pack years: 80.00    Types: Cigarettes    Start date: 77    Quit date: 07/20/1995    Years since quitting: 27.3    Passive  exposure: Never   Smokeless tobacco: Never  Vaping Use   Vaping Use: Never used  Substance and Sexual Activity   Alcohol use: No    Alcohol/week: 0.0 standard drinks of alcohol   Drug use: No   Sexual activity: Yes    Partners: Female    Birth control/protection: Condom    Comment: friend  Other Topics Concern   Not on file  Social History Narrative   Not on file   Social Determinants of Health   Financial Resource Strain: Not on file  Food Insecurity: Not on file  Transportation Needs: Not on file  Physical Activity: Not on file  Stress: Not on file  Social Connections: Not on file  Intimate Partner Violence: Not on file    Review of Systems   General: Negative for anorexia, weight loss, fever, chills, fatigue, weakness. ENT: Negative for hoarseness, difficulty swallowing , nasal congestion. CV: Negative for chest pain, angina, palpitations, dyspnea on exertion, peripheral edema.  Respiratory: Negative for dyspnea at rest, dyspnea on exertion, cough, sputum, wheezing.  GI: See history of present illness. GU:  Negative for dysuria, hematuria, urinary incontinence, urinary frequency, nocturnal urination.  Endo: Negative for unusual weight change.     Physical Exam   There were no vitals taken for this visit.   General: Well-nourished, well-developed in no acute distress.  Eyes: No icterus. Mouth: Oropharyngeal mucosa moist and pink , no lesions erythema or exudate. Lungs: Clear to auscultation bilaterally.  Heart: Regular rate and rhythm, no murmurs rubs or gallops.  Abdomen: Bowel sounds are normal, nontender, nondistended, no hepatosplenomegaly or masses,  no abdominal bruits or hernia , no rebound or guarding.  Rectal: ***  Extremities: No lower extremity edema. No clubbing or deformities. Neuro: Alert and oriented x 4   Skin: Warm and dry, no jaundice.   Psych: Alert and cooperative, normal  mood and affect.  Labs   *** Imaging Studies   No results  found.  Assessment       PLAN   ***   Leanna Battles. Melvyn Neth, MHS, PA-C [facility]

## 2022-11-29 ENCOUNTER — Ambulatory Visit (INDEPENDENT_AMBULATORY_CARE_PROVIDER_SITE_OTHER): Payer: Medicare Other | Admitting: Gastroenterology

## 2022-11-29 ENCOUNTER — Encounter: Payer: Self-pay | Admitting: Gastroenterology

## 2022-11-29 VITALS — BP 146/62 | HR 62 | Temp 97.4°F | Ht 68.0 in | Wt 158.2 lb

## 2022-11-29 DIAGNOSIS — K2270 Barrett's esophagus without dysplasia: Secondary | ICD-10-CM

## 2022-11-29 DIAGNOSIS — K219 Gastro-esophageal reflux disease without esophagitis: Secondary | ICD-10-CM | POA: Diagnosis not present

## 2022-11-29 MED ORDER — SUCRALFATE 1 G PO TABS: 1.0000 g | ORAL_TABLET | Freq: Three times a day (TID) | ORAL | 5 refills | Status: DC

## 2022-11-29 NOTE — Patient Instructions (Signed)
Continue lansoprazole once daily before supper. Continue sucralfate up to four times daily as needed for esophageal pain, heartburn. Return to the office in one year or sooner if needed.

## 2022-12-21 DIAGNOSIS — H01002 Unspecified blepharitis right lower eyelid: Secondary | ICD-10-CM | POA: Diagnosis not present

## 2022-12-21 DIAGNOSIS — H401122 Primary open-angle glaucoma, left eye, moderate stage: Secondary | ICD-10-CM | POA: Diagnosis not present

## 2022-12-21 DIAGNOSIS — H01001 Unspecified blepharitis right upper eyelid: Secondary | ICD-10-CM | POA: Diagnosis not present

## 2022-12-21 DIAGNOSIS — H401111 Primary open-angle glaucoma, right eye, mild stage: Secondary | ICD-10-CM | POA: Diagnosis not present

## 2023-01-05 DIAGNOSIS — H748X9 Other specified disorders of middle ear and mastoid, unspecified ear: Secondary | ICD-10-CM | POA: Diagnosis not present

## 2023-01-05 DIAGNOSIS — R519 Headache, unspecified: Secondary | ICD-10-CM | POA: Diagnosis not present

## 2023-01-05 DIAGNOSIS — S70261D Insect bite (nonvenomous), right hip, subsequent encounter: Secondary | ICD-10-CM | POA: Diagnosis not present

## 2023-01-05 DIAGNOSIS — I6529 Occlusion and stenosis of unspecified carotid artery: Secondary | ICD-10-CM | POA: Diagnosis not present

## 2023-01-05 DIAGNOSIS — H748X2 Other specified disorders of left middle ear and mastoid: Secondary | ICD-10-CM | POA: Diagnosis not present

## 2023-01-05 DIAGNOSIS — H6590 Unspecified nonsuppurative otitis media, unspecified ear: Secondary | ICD-10-CM | POA: Insufficient documentation

## 2023-01-05 DIAGNOSIS — W57XXXA Bitten or stung by nonvenomous insect and other nonvenomous arthropods, initial encounter: Secondary | ICD-10-CM | POA: Diagnosis not present

## 2023-01-05 DIAGNOSIS — W57XXXD Bitten or stung by nonvenomous insect and other nonvenomous arthropods, subsequent encounter: Secondary | ICD-10-CM | POA: Diagnosis not present

## 2023-01-24 NOTE — Progress Notes (Signed)
Office Note     CC:  follow up Requesting Provider:  Benita Stabile, MD  HPI: Gregory Eaton is a 77 y.o. (Mar 15, 1946) male who presents for surveillance of PAD.  Surgical history significant for left femoral to below the knee popliteal bypass with ringed PTFE on 05/11/2021.  He has also had multiple interventions including right iliofemoral endarterectomy with bilateral common iliac stents and right external iliac stent on 04/11/2021.  He also had left external iliac artery stenting on 04/03/2021.    Most recently abdominal angiogram with drug-coated balloon angioplasty and extension of right iliac stents 09/01/2022  He denies any claudication, rest pain, or tissue loss.  He is on Plavix and statin daily.  He denies tobacco use. He is also followed for carotid artery stenosis.  He does not have any strokelike symptoms including slurring speech, changes in vision, or one-sided weakness.  He recently had a headache, and I asked if this could be from his carotid arteries.  Past Medical History:  Diagnosis Date   Adenomatous colon polyp 03/2009   Last colonoscopy by Dr. Jena Gauss    Adenomatous polyp 2010   Adenomatous polyp of colon 11/03/2010   Arthritis    Barrett's esophagus    CAD (coronary artery disease)    Complication of anesthesia    has a shortened esophogus due to CA.    COPD (chronic obstructive pulmonary disease) (HCC)    Severe emphysema per CT   Diverticulosis    Emphysema of lung (HCC)    Esophageal carcinoma (HCC) 03/2009   T1N1M0   GERD (gastroesophageal reflux disease)    Glaucoma    History of Doppler ultrasound 11/09/2011   03/2014- 50-69% L ICA stenosis;carotid doppler; L bulb/prox ICA 0-49% diameter reduction; L vertebral artery - occlusive ds; L ECA  demonstrates severe amount of fibrous plaque   History of Doppler ultrasound 11/09/2011   LEAs; R ABI - mod art. insuff.; L ABI normal at rest; R SFA - occlusive ds; L SFA - occlusive ds; patent fem-pop graft   History  of echocardiogram 08/27/2009   EF >55%   History of hiatal hernia    History of kidney stones 1965   History of nuclear stress test 11/24/2011   lexiscan; normal perfusion; low risk scan; non-diagnostic for ischemia   Hyperlipidemia    Hypertension    Left carotid artery stenosis 04/08/2014   Pneumonia    Pre-diabetes    Pulmonary nodule, right 04/08/2014   2.8 mm-incidental finding on CT   PVD (peripheral vascular disease) (HCC)    Tachyarrhythmia 1999   Status post ablation at [facility]   Tobacco abuse     Past Surgical History:  Procedure Laterality Date   ABDOMINAL AORTOGRAM W/LOWER EXTREMITY N/A 04/01/2021   Procedure: ABDOMINAL AORTOGRAM W/LOWER EXTREMITY;  Surgeon: Victorino Sparrow, MD;  Location: [facility] INVASIVE CV LAB;  Service: Cardiovascular;  Laterality: N/A;   ABDOMINAL AORTOGRAM W/LOWER EXTREMITY N/A 09/01/2022   Procedure: ABDOMINAL AORTOGRAM W/LOWER EXTREMITY;  Surgeon: Victorino Sparrow, MD;  Location: [facility] INVASIVE CV LAB;  Service: Cardiovascular;  Laterality: N/A;   BIOPSY  10/24/2017   Procedure: BIOPSY;  Surgeon: Corbin Ade, MD;  Location: [facility];  Service: Endoscopy;;  esophagus   BIOPSY  10/22/2021   Procedure: BIOPSY;  Surgeon: Corbin Ade, MD;  Location: [facility];  Service: Endoscopy;;   CATARACT EXTRACTION Bilateral    COLONOSCOPY  03/17/2009   Dr.Rourk- normal rectum, sigmoid diverticula, some pale sigmoid  mucosa with diffuse petechiae. pedunculated polyp at the splenic flexure, remainder of colonic mucosa appeared normal. bx= adenomatous polyp   COLONOSCOPY  04/19/2003   Dr.Rehman- few diverticu;a at the sigmoid colon, 3 small polyps, one at the transverse colon and 2 at the sigmoid, small external hemorrhoids. bx report not available.    COLONOSCOPY WITH PROPOFOL N/A 10/24/2017   Dr. Jena Gauss: Diverticulosis, 11 mm polyp at the ileocecal valve, tubular adenoma.  Repeat colonoscopy in 3 years   COLONOSCOPY WITH PROPOFOL N/A 10/22/2021   Procedure:  COLONOSCOPY WITH PROPOFOL;  Surgeon: Corbin Ade, MD;  Location: [facility];  Service: Endoscopy;  Laterality: N/A;  11:00am   CORONARY ARTERY BYPASS GRAFT  1998   [name]   ENDARTERECTOMY FEMORAL Right 04/13/2021   Procedure: RIGHT ILIOFEMORAL ARTERY ENDARTERECTOMY WITH PATCH ANGIOPLASTY USING HEMASHIELD PALTINUM FINESSE DACRON PATCH;  Surgeon: Victorino Sparrow, MD;  Location: [facility] OR;  Service: Vascular;  Laterality: Right;   ESOPHAGECTOMY  2010   [facility] Dr. [name]   ESOPHAGOGASTRODUODENOSCOPY  11/25/2010   Dr.Rourk- s/p esophagectomy with gastric pull-up, esophageal erosions straddling the surgical anastomosis, salmon colored epithelium coming up a good centimeter to a centimeter and a half above the suture line, islands of salmon colored epithelium in the most poximal residual esophagus, remainder of gastric mucosa appeared normal. bx= swamous &gastric glandular mucosa w/chronic active inflammation   ESOPHAGOGASTRODUODENOSCOPY  03/17/2009   Dr.Rourk- 4cm segment of salmon-colored epithlium distal esophagus suspicious for barretts esophagus. area of suspicious nodularity w/in this segment bx seperately. small to moderate size hiatal hernia, o/w normal stomach D1 and D2 bx=adenocarcinoma   ESOPHAGOGASTRODUODENOSCOPY (EGD) WITH PROPOFOL N/A 10/24/2017   Dr. Jena Gauss: Remnant of esophagus with anastomosis with stomach at 24 cm from the incisors, 2 cm above the anastomosis he was found to have Barrett's but no dysplasia.  Advised for repeat EGD in 3 years   ESOPHAGOGASTRODUODENOSCOPY (EGD) WITH PROPOFOL N/A 10/22/2021   Procedure: ESOPHAGOGASTRODUODENOSCOPY (EGD) WITH PROPOFOL;  Surgeon: Corbin Ade, MD;  Location: [facility];  Service: Endoscopy;  Laterality: N/A;   EYE SURGERY     cataract   FEMORAL-POPLITEAL BYPASS GRAFT  1993   occlusive ds in R SFA   FEMORAL-POPLITEAL BYPASS GRAFT Left 05/11/2021   Procedure: LEFT FEMORAL-BELOW KNEE POPLITEAL ARTERY BYPASS GRAFT WITH 6 mm PTFE  and COMPLETION ANGIOGRAM;  Surgeon: Victorino Sparrow, MD;  Location: [facility] OR;  Service: Vascular;  Laterality: Left;   GASTRIC PULL THROUGH  2010   With esophagectomy   INSERTION OF ILIAC STENT Bilateral 04/13/2021   Procedure: INSERTION OF BILATERAL COMMON ILIAC KISSING STENTS AND INSERION OF RIGHT EXTERNAL ILIAC STENT;  Surgeon: Victorino Sparrow, MD;  Location: [facility] OR;  Service: Vascular;  Laterality: Bilateral;   JOINT REPLACEMENT     LIPOMA EXCISION  2010   LOWER EXTREMITY ANGIOGRAM Left 04/13/2021   Procedure: LEFT LOWER EXTREMITY ANGIOGRAM ;  Surgeon: Victorino Sparrow, MD;  Location: [facility] OR;  Service: Vascular;  Laterality: Left;   PERIPHERAL VASCULAR INTERVENTION  04/01/2021   Procedure: PERIPHERAL VASCULAR INTERVENTION;  Surgeon: Victorino Sparrow, MD;  Location: [facility] INVASIVE CV LAB;  Service: Cardiovascular;;   PERIPHERAL VASCULAR INTERVENTION Right 09/01/2022   Procedure: PERIPHERAL VASCULAR INTERVENTION;  Surgeon: Victorino Sparrow, MD;  Location: [facility] INVASIVE CV LAB;  Service: Cardiovascular;  Laterality: Right;  right external iliac   POLYPECTOMY  10/24/2017   Procedure: POLYPECTOMY;  Surgeon: Corbin Ade, MD;  Location: [facility];  Service:  Endoscopy;;  colon    POLYPECTOMY  10/22/2021   Procedure: POLYPECTOMY;  Surgeon: Corbin Ade, MD;  Location: [facility];  Service: Endoscopy;;   TOTAL HIP ARTHROPLASTY Left 01/30/2019   Procedure: TOTAL HIP ARTHROPLASTY ANTERIOR APPROACH;  Surgeon: Sheral Apley, MD;  Location: [facility] ORS;  Service: Orthopedics;  Laterality: Left;   ULTRASOUND GUIDANCE FOR VASCULAR ACCESS Left 04/13/2021   Procedure: ULTRASOUND GUIDANCE FOR VASCULAR ACCESS, left femoral;  Surgeon: Victorino Sparrow, MD;  Location: [facility] OR;  Service: Vascular;  Laterality: Left;    Social History   Socioeconomic History   Marital status: Married    Spouse name: Not on file   Number of children: 2   Years of education: Not on file   Highest education level: Not on file   Occupational History   Occupation: retired    Associate Professor: RETIRED    Comment: truck driver  Tobacco Use   Smoking status: Former    Packs/day: 2.00    Years: 40.00    Additional pack years: 0.00    Total pack years: 80.00    Types: Cigarettes    Start date: 63    Quit date: 07/20/1995    Years since quitting: 27.5    Passive exposure: Never   Smokeless tobacco: Never  Vaping Use   Vaping Use: Never used  Substance and Sexual Activity   Alcohol use: No    Alcohol/week: 0.0 standard drinks of alcohol   Drug use: No   Sexual activity: Yes    Partners: Female    Birth control/protection: Condom    Comment: friend  Other Topics Concern   Not on file  Social History Narrative   Not on file   Social Determinants of Health   Financial Resource Strain: Not on file  Food Insecurity: Not on file  Transportation Needs: Not on file  Physical Activity: Not on file  Stress: Not on file  Social Connections: Not on file  Intimate Partner Violence: Not on file    Family History  Problem Relation Age of Onset   GER disease Mother    Coronary artery disease Brother    Congenital heart disease Sister    Colon cancer Neg Hx     Current Outpatient Medications  Medication Sig Dispense Refill   acetaminophen (TYLENOL) 500 MG tablet Take 1,000 mg by mouth daily as needed for headache.     albuterol (PROVENTIL) (2.5 MG/3ML) 0.083% nebulizer solution Take 3 mLs (2.5 mg total) by nebulization every 6 (six) hours as needed for wheezing or shortness of breath. DX: J44.9 75 mL 6   albuterol (VENTOLIN HFA) 108 (90 Base) MCG/ACT inhaler Inhale 2 puffs into the lungs every 6 (six) hours as needed for wheezing or shortness of breath.     ALPRAZolam (XANAX) 0.5 MG tablet Take 1 tablet (0.5 mg total) by mouth at bedtime. 30 tablet 0   amLODipine (NORVASC) 10 MG tablet Take 10 mg by mouth daily.     aspirin EC 81 MG tablet Take 1 tablet (81 mg total) by mouth daily. Swallow whole. 150 tablet 2    baclofen (LIORESAL) 10 MG tablet Take 1 tablet (10 mg total) by mouth 3 (three) times daily as needed for muscle spasms. 20 each 0   clopidogrel (PLAVIX) 75 MG tablet TAKE ONE (1) TABLET BY MOUTH EVERY DAY 90 tablet 0   dorzolamide-timolol (COSOPT) 2-0.5 % ophthalmic solution Place 1 drop into both eyes 2 (two) times daily.  gabapentin (NEURONTIN) 100 MG capsule Take 100 mg by mouth 3 (three) times daily as needed for pain.     lansoprazole (PREVACID) 30 MG capsule Take 1 capsule (30 mg total) by mouth daily before supper. 90 capsule 3   losartan (COZAAR) 100 MG tablet TAKE ONE TABLET BY MOUTH EVERY NIGHT AT BEDTIME 90 tablet 3   metoprolol succinate (TOPROL-XL) 50 MG 24 hr tablet TAKE ONE (1) TABLET BY MOUTH EVERY DAY 90 tablet 1   montelukast (SINGULAIR) 10 MG tablet Take 1 tablet (10 mg total) by mouth daily. 90 tablet 3   Multiple Vitamins-Minerals (CENTRUM SILVER PO) Take 1 tablet by mouth daily.     nitroGLYCERIN (NITROSTAT) 0.4 MG SL tablet Place 1 tablet (0.4 mg total) under the tongue every 5 (five) minutes as needed for chest pain. 25 tablet 0   rosuvastatin (CRESTOR) 10 MG tablet Take 10 mg by mouth daily.     spironolactone (ALDACTONE) 25 MG tablet Take 0.5 tablets (12.5 mg total) by mouth daily. 45 tablet 3   sucralfate (CARAFATE) 1 g tablet Take 1 tablet (1 g total) by mouth 4 (four) times daily -  with meals and at bedtime. As needed for esophageal pain, heartburn. You can crush in couple of tablespoons of water to make suspension. 30 tablet 5   umeclidinium-vilanterol (ANORO ELLIPTA) 62.5-25 MCG/ACT AEPB Inhale 1 puff into the lungs daily. (Patient taking differently: Inhale 1 puff into the lungs daily as needed (Shortness for breath).) 60 each 5   No current facility-administered medications for this visit.    Allergies  Allergen Reactions   Altace [Ramipril] Cough     REVIEW OF SYSTEMS:   [X]  denotes positive finding, [ ]  denotes negative finding Cardiac  Comments:   Chest pain or chest pressure:    Shortness of breath upon exertion:    Short of breath when lying flat:    Irregular heart rhythm:        Vascular    Pain in calf, thigh, or hip brought on by ambulation:    Pain in feet at night that wakes you up from your sleep:     Blood clot in your veins:    Leg swelling:         Pulmonary    Oxygen at home:    Productive cough:     Wheezing:         Neurologic    Sudden weakness in arms or legs:     Sudden numbness in arms or legs:     Sudden onset of difficulty speaking or slurred speech:    Temporary loss of vision in one eye:     Problems with dizziness:         Gastrointestinal    Blood in stool:     Vomited blood:         Genitourinary    Burning when urinating:     Blood in urine:        Psychiatric    Major depression:         Hematologic    Bleeding problems:    Problems with blood clotting too easily:        Skin    Rashes or ulcers:        Constitutional    Fever or chills:      PHYSICAL EXAMINATION:  There were no vitals filed for this visit.   General:  WDWN in NAD; vital signs documented above Gait: Not observed HENT:  WNL, normocephalic Pulmonary: normal non-labored breathing , without Rales, rhonchi,  wheezing Cardiac: regular HR Abdomen: soft, NT, no masses Skin: without rashes Vascular Exam/Pulses: Palpable left DP; symmetrical 2+ femoral pulses; absent right pedal pulses Extremities: without ischemic changes, without Gangrene , without cellulitis; without open wounds;  Musculoskeletal: no muscle wasting or atrophy  Neurologic: A&O X 3;  No focal weakness or paresthesias are detected Psychiatric:  The pt has Normal affect.   Non-Invasive Vascular Imaging:    Abdominal Aorta Findings:  +-------------+-------+----------+----------+--------+--------+--------+  Location    [facility] (cm)Trans (cm)PSV (cm/s)WaveformThrombusComments   +-------------+-------+----------+----------+--------+--------+--------+  RT CIA Prox                   77                        stent     +-------------+-------+----------+----------+--------+--------+--------+  RT CIA Distal                 131                       stent     +-------------+-------+----------+----------+--------+--------+--------+  RT EIA Prox                   206                       stent     +-------------+-------+----------+----------+--------+--------+--------+  RT EIA Mid                    243                       stent     +-------------+-------+----------+----------+--------+--------+--------+  RT EIA Distal                 305                       stent     +-------------+-------+----------+----------+--------+--------+--------+  LT CIA Prox                   55                        stent     +-------------+-------+----------+----------+--------+--------+--------+  LT CIA Mid                    48                        stent     +-------------+-------+----------+----------+--------+--------+--------+  LT CIA Distal                 94                        stent     +-------------+-------+----------+----------+--------+--------+--------+  LT EIA Prox                   137                       stent     +-------------+-------+----------+----------+--------+--------+--------+  LT EIA Mid                    222  stent     +-------------+-------+----------+----------+--------+--------+--------+  LT EIA Distal                 283                       stent     +-------------+-------+----------+----------+--------+--------+--------+   +-------+-----------+-----------+------------+------------+  ABI/TBIToday's ABIToday's TBIPrevious ABIPrevious TBI  +-------+-----------+-----------+------------+------------+  Right [place]         0.40       [place]           0.38          +-------+-----------+-----------+------------+------------+  Left  [place]         0.75       [place]          0.76          +-------+-----------+-----------+------------+------------+   Left Graft #1: femoropopliteal  +--------------------+--------+--------+--------+--------+                     PSV cm/sStenosisWaveformComments  +--------------------+--------+--------+--------+--------+  Inflow             141             biphasic          +--------------------+--------+--------+--------+--------+  Proximal Anastomosis74              biphasic          +--------------------+--------+--------+--------+--------+  Proximal Graft      57              biphasic          +--------------------+--------+--------+--------+--------+  Mid Graft           66              biphasic          +--------------------+--------+--------+--------+--------+  Distal Graft        69              biphasic          +--------------------+--------+--------+--------+--------+  Distal Anastomosis  88              biphasic          +--------------------+--------+--------+--------+--------+  Outflow            91              biphasic          +--------------------+--------+--------+--------+--------+     ASSESSMENT/PLAN:: 77 y.o. male here for follow up for surveillance of PAD and carotid artery stenosis. Most recent intervention was RLE angiogram for elevated velocities within the right external iliac stents. This resulted in balloon angioplasty and stent extension distally for primary assisted patency.   Over the last 3 months, Lesslie has been doing well.  He has no complaints. ABI non compressible.  No change in toe pressure. On physical exam he had readily palpable pulses in bilateral common femoral arteries, excellent DP pulse in the left foot.   Carotids will be checked at his next visit in 3 months  Velocities at the right external iliac  remain elevated - will observe at this time with close interval follow up (3 months).    Victorino Sparrow, MD [facility] [phone] Total time of patient care including pre-visit research, consultation, and documentation greater than 30 minutes

## 2023-01-28 ENCOUNTER — Ambulatory Visit: Payer: Medicare Other | Admitting: Vascular Surgery

## 2023-01-28 ENCOUNTER — Ambulatory Visit (INDEPENDENT_AMBULATORY_CARE_PROVIDER_SITE_OTHER)
Admission: RE | Admit: 2023-01-28 | Discharge: 2023-01-28 | Disposition: A | Discharge status: Home / Self Care | Payer: Medicare Other | Source: Ambulatory Visit | Attending: Vascular Surgery | Admitting: Vascular Surgery

## 2023-01-28 ENCOUNTER — Ambulatory Visit (HOSPITAL_COMMUNITY)
Admission: RE | Admit: 2023-01-28 | Discharge: 2023-01-28 | Disposition: A | Discharge status: Home / Self Care | Payer: Medicare Other | Source: Ambulatory Visit | Attending: Vascular Surgery | Admitting: Vascular Surgery

## 2023-01-28 ENCOUNTER — Other Ambulatory Visit (HOSPITAL_COMMUNITY): Payer: Self-pay | Admitting: Vascular Surgery

## 2023-01-28 ENCOUNTER — Encounter (HOSPITAL_COMMUNITY): Payer: Medicare Other

## 2023-01-28 ENCOUNTER — Encounter: Payer: Self-pay | Admitting: Vascular Surgery

## 2023-01-28 VITALS — BP 151/78 | HR 68 | Temp 97.9°F | Resp 20 | Ht 68.0 in | Wt 158.0 lb

## 2023-01-28 DIAGNOSIS — Z95828 Presence of other vascular implants and grafts: Secondary | ICD-10-CM | POA: Diagnosis not present

## 2023-01-28 DIAGNOSIS — I739 Peripheral vascular disease, unspecified: Secondary | ICD-10-CM | POA: Diagnosis not present

## 2023-01-28 DIAGNOSIS — I70222 Atherosclerosis of native arteries of extremities with rest pain, left leg: Secondary | ICD-10-CM

## 2023-01-28 DIAGNOSIS — I6522 Occlusion and stenosis of left carotid artery: Secondary | ICD-10-CM | POA: Diagnosis not present

## 2023-01-28 LAB — VAS US ABI WITH/WO TBI

## 2023-01-29 ENCOUNTER — Other Ambulatory Visit: Payer: Self-pay

## 2023-01-29 DIAGNOSIS — I6522 Occlusion and stenosis of left carotid artery: Secondary | ICD-10-CM

## 2023-01-29 DIAGNOSIS — Z95828 Presence of other vascular implants and grafts: Secondary | ICD-10-CM

## 2023-01-29 DIAGNOSIS — I739 Peripheral vascular disease, unspecified: Secondary | ICD-10-CM

## 2023-02-09 ENCOUNTER — Other Ambulatory Visit: Payer: Self-pay | Admitting: Pulmonary Disease

## 2023-03-07 DIAGNOSIS — G629 Polyneuropathy, unspecified: Secondary | ICD-10-CM | POA: Diagnosis not present

## 2023-03-07 DIAGNOSIS — K219 Gastro-esophageal reflux disease without esophagitis: Secondary | ICD-10-CM | POA: Diagnosis not present

## 2023-03-07 DIAGNOSIS — U071 COVID-19: Secondary | ICD-10-CM | POA: Diagnosis not present

## 2023-03-07 DIAGNOSIS — Z0000 Encounter for general adult medical examination without abnormal findings: Secondary | ICD-10-CM | POA: Diagnosis not present

## 2023-03-07 DIAGNOSIS — G8929 Other chronic pain: Secondary | ICD-10-CM | POA: Diagnosis not present

## 2023-03-07 DIAGNOSIS — J439 Emphysema, unspecified: Secondary | ICD-10-CM | POA: Diagnosis not present

## 2023-03-07 DIAGNOSIS — Z0001 Encounter for general adult medical examination with abnormal findings: Secondary | ICD-10-CM | POA: Diagnosis not present

## 2023-03-07 DIAGNOSIS — I10 Essential (primary) hypertension: Secondary | ICD-10-CM | POA: Diagnosis not present

## 2023-03-07 DIAGNOSIS — I25810 Atherosclerosis of coronary artery bypass graft(s) without angina pectoris: Secondary | ICD-10-CM | POA: Diagnosis not present

## 2023-03-07 DIAGNOSIS — J302 Other seasonal allergic rhinitis: Secondary | ICD-10-CM | POA: Diagnosis not present

## 2023-04-11 ENCOUNTER — Other Ambulatory Visit (HOSPITAL_BASED_OUTPATIENT_CLINIC_OR_DEPARTMENT_OTHER): Payer: Self-pay

## 2023-04-11 ENCOUNTER — Encounter (HOSPITAL_BASED_OUTPATIENT_CLINIC_OR_DEPARTMENT_OTHER): Payer: Self-pay | Admitting: Pulmonary Disease

## 2023-04-11 ENCOUNTER — Ambulatory Visit (HOSPITAL_BASED_OUTPATIENT_CLINIC_OR_DEPARTMENT_OTHER): Payer: Medicare Other | Admitting: Pulmonary Disease

## 2023-04-11 DIAGNOSIS — J449 Chronic obstructive pulmonary disease, unspecified: Secondary | ICD-10-CM

## 2023-04-11 MED ORDER — ALBUTEROL SULFATE HFA 108 (90 BASE) MCG/ACT IN AERS: 2.0000 | INHALATION_SPRAY | Freq: Four times a day (QID) | RESPIRATORY_TRACT | 3 refills | Status: DC | PRN

## 2023-04-11 MED ORDER — ANORO ELLIPTA 62.5-25 MCG/ACT IN AEPB: 1.0000 | INHALATION_SPRAY | Freq: Every day | RESPIRATORY_TRACT | 5 refills | Status: DC

## 2023-04-11 MED ORDER — RSVPREF3 VAC RECOMB ADJUVANTED 120 MCG/0.5ML IM SUSR
0.5000 mL | Freq: Once | INTRAMUSCULAR | 0 refills | Status: AC
  Filled 2023-04-11: qty 0.5, 1d supply, fill #0

## 2023-04-11 MED ORDER — ANORO ELLIPTA 62.5-25 MCG/ACT IN AEPB: 1.0000 | INHALATION_SPRAY | Freq: Every day | RESPIRATORY_TRACT | 11 refills | Status: AC

## 2023-04-11 MED ORDER — MONTELUKAST SODIUM 10 MG PO TABS: 10.0000 mg | ORAL_TABLET | Freq: Every day | ORAL | 3 refills | Status: DC

## 2023-04-11 NOTE — Progress Notes (Addendum)
Subjective:   PATIENT ID: Gregory Eaton GENDER: male DOB: Mar 11, 1946, MRN: [id number]   HPI  Chief Complaint  Patient presents with   Follow-up    Reason for Visit: Follow-up   Gregory Eaton is a 77 year old male former smoker with COPD (FEV1 59%), status post CABG, hypertension, PVD and history of esophageal cancer status post esophagectomy and gastric pull-through in 2010 who presents for follow-up.  11/05/20 He reports his COPD is very well controlled. Denies any recent exacerbations requiring steroids or antibiotics. No ED/urgent care visit. Compliant with his Bevespi. He has used his albuterol twice since our last visit six months ago. He still has shortness of breath when walking uphill but feels that his legs have decreased circulation that may be contributing. Denies cough and wheezing.  09/03/21 Since our last visit, he reports his COPD has worsened but unsure if this is related to his reflux or heart. Denies coughing or wheezing. Has chest tightness. He is able to work in the yard and lift heavy loads without issue. No limitations in activity. He has been trying to see his Cardiologist. He has not been taking Bevespi that will cause chest discomfort so he is only taking it 1-2 days a week. Has not used his rescue inhaler.  He underwent a left femoral to below-knee popliteal arterial bypass on 05/11/2021. No issues except for needing a course of antibiotics post-procedure for soft tissue infection. More recently, He was seen in the ED on 08/07/2021.  Notes were reviewed.  He presented with chest pain and shortness of breath for 1 week.  D-dimer was elevated so was sent to the ED for further work-up.  CTA was negative for PE  10/14/21 Since our last visit, he failed Bevespi and was started on Anoro. Tolerating inhalers. Able to perform yardwork without limitation. He is walking 3-4 blocks daily but notices substernal chest pain with moderate exertion. He is scheduled to see  Cardiology on 10/23/21 as he is concerned about heart blockage. Denies shortness of breath, cough or wheezing.  04/12/22 Since our last visit he reports his symptoms have been stable. Sometimes he has a congested cough that will clear with coughing. However he checked his O2 measurement and it read 81%. Denies shortness of breath or wheezing.  No exacerbations in the last 6 months.  07/22/22 Since our last visit he reports recent flu which was treated with tamiflu and azithromycin. He continues to cough, shortness of breath, wheezing and fatigue with low grade fevers.   04/11/23 Since our last visit, he reports compliance with Anoro. He was treated for covid 8 weeks ago with Augmentin for possible co-comitant pneumonia. Prior to this he did not have a cough. At baseline no significant shortness of breath, cough and wheezing.   Social History: 60-pack-year smoking history. 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 Jena Gauss    Adenomatous polyp 2010   Adenomatous polyp of colon 11/03/2010   Arthritis    Barrett's esophagus    CAD (coronary artery disease)    Complication of anesthesia    has a shortened esophogus due to CA.    COPD (chronic obstructive pulmonary disease) (HCC)    Severe emphysema per CT   Diverticulosis    Emphysema of lung (HCC)    Esophageal carcinoma (  HCC) 03/2009   T1N1M0   GERD (gastroesophageal reflux disease)    Glaucoma    History of Doppler ultrasound 11/09/2011   03/2014- 50-69% L ICA stenosis;carotid doppler; L bulb/prox ICA 0-49% diameter reduction; L vertebral artery - occlusive ds; L ECA  demonstrates severe amount of fibrous plaque   History of Doppler ultrasound 11/09/2011   LEAs; R ABI - mod art. insuff.; L ABI normal at rest; R SFA - occlusive ds; L SFA - occlusive  ds; patent fem-pop graft   History of echocardiogram 08/27/2009   EF >55%   History of hiatal hernia    History of kidney stones 1965   History of nuclear stress test 11/24/2011   lexiscan; normal perfusion; low risk scan; non-diagnostic for ischemia   Hyperlipidemia    Hypertension    Left carotid artery stenosis 04/08/2014   Pneumonia    Pre-diabetes    Pulmonary nodule, right 04/08/2014   2.8 mm-incidental finding on CT   PVD (peripheral vascular disease) (HCC)    Tachyarrhythmia 1999   Status post ablation at [facility]   Tobacco abuse     Outpatient Medications Prior to Visit  Medication Sig Dispense Refill   acetaminophen (TYLENOL) 500 MG tablet Take 1,000 mg by mouth daily as needed for headache.     albuterol (PROVENTIL) (2.5 MG/3ML) 0.083% nebulizer solution USE ONE VIAL (2.5MG  TOTAL) IN NEBULIZER EVERY SIX HOURS AS NEEDED FOR WHEEZING OR SHORTNESS OF BREATH 75 mL 6   ALPRAZolam (XANAX) 0.5 MG tablet Take 1 tablet (0.5 mg total) by mouth at bedtime. 30 tablet 0   amLODipine (NORVASC) 10 MG tablet Take 10 mg by mouth daily.     aspirin EC 81 MG tablet Take 1 tablet (81 mg total) by mouth daily. Swallow whole. 150 tablet 2   baclofen (LIORESAL) 10 MG tablet Take 1 tablet (10 mg total) by mouth 3 (three) times daily as needed for muscle spasms. 20 each 0   clopidogrel (PLAVIX) 75 MG tablet TAKE ONE (1) TABLET BY MOUTH EVERY DAY 90 tablet 0   dorzolamide-timolol (COSOPT) 2-0.5 % ophthalmic solution Place 1 drop into both eyes 2 (two) times daily.     gabapentin (NEURONTIN) 100 MG capsule Take 100 mg by mouth 3 (three) times daily as needed for pain.     lansoprazole (PREVACID) 30 MG capsule Take 1 capsule (30 mg total) by mouth daily before supper. 90 capsule 3   losartan (COZAAR) 100 MG tablet TAKE ONE TABLET BY MOUTH EVERY NIGHT AT BEDTIME 90 tablet 3   metoprolol succinate (TOPROL-XL) 50 MG 24 hr tablet TAKE ONE (1) TABLET BY MOUTH EVERY DAY 90 tablet 1   Multiple  Vitamins-Minerals (CENTRUM SILVER PO) Take 1 tablet by mouth daily.     nitroGLYCERIN (NITROSTAT) 0.4 MG SL tablet Place 1 tablet (0.4 mg total) under the tongue every 5 (five) minutes as needed for chest pain. 25 tablet 0   rosuvastatin (CRESTOR) 10 MG tablet Take 10 mg by mouth daily.     spironolactone (ALDACTONE) 25 MG tablet Take 0.5 tablets (12.5 mg total) by mouth daily. 45 tablet 3   sucralfate (CARAFATE) 1 g tablet Take 1 tablet (1 g total) by mouth 4 (four) times daily -  with meals and at bedtime. As needed for esophageal pain, heartburn. You can crush in couple of tablespoons of water to make suspension. 30 tablet 5   albuterol (VENTOLIN HFA) 108 (90 Base) MCG/ACT inhaler Inhale 2 puffs into the lungs every  6 (six) hours as needed for wheezing or shortness of breath.     montelukast (SINGULAIR) 10 MG tablet Take 1 tablet (10 mg total) by mouth daily. 90 tablet 3   umeclidinium-vilanterol (ANORO ELLIPTA) 62.5-25 MCG/ACT AEPB Inhale 1 puff into the lungs daily. (Patient taking differently: Inhale 1 puff into the lungs daily as needed (Shortness for breath).) 60 each 5   No facility-administered medications prior to visit.    Review of Systems  Constitutional:  Negative for chills, diaphoresis, fever, malaise/fatigue and weight loss.  HENT:  Negative for congestion.   Respiratory:  Positive for cough. Negative for hemoptysis, sputum production, shortness of breath and wheezing.   Cardiovascular:  Negative for chest pain, palpitations and leg swelling.     Objective:   Vitals:   04/11/23 1109 04/11/23 1110  BP: (!) 140/62 (!) 140/60  Pulse: 79   Resp: 16   SpO2: 93%   Weight: 155 lb (70.3 kg)   Height: 5\' 8"  (1.727 m)   SpO2: 93 %  Physical Exam: General: Well-appearing, no acute distress HENT: [place], AT Eyes: EOMI, no scleral icterus Respiratory: Clear to auscultation bilaterally.  No crackles, wheezing or rales Cardiovascular: RRR, -M/R/G, no  JVD Extremities:-Edema,-tenderness Neuro: AAO x4, CNII-XII grossly intact Psych: Normal mood, normal affect   Data Reviewed:  Imaging: CT Chest 11/22/17 - Centrilobular and paraseptal emphysema. 8mm nodule in the RLL, unchanged since 2015 CTA 08/15/2021-no pulmonary embolism.  Stable right lower lobe 8 mm nodule.  Emphysema. CXR 07/22/22 - Infrahilar infiltrate on lateral view  PFT: 03/23/2016 FVC 4.1 L (101 %) FEV1 1.8 (59 %) Ratio 58  Interpretation: Moderately severe obstructive defect.  Normal vital capacity.  10/14/21 FVC 3.50 (87%) FEV1 1.86 (64%) Ratio 52  TLC 109% RV 137% DLCO 50%. Borderline but not significant bronchodilator response Interpretation: Moderate obstructive lung defect with air trapping and moderately reduced gas exchange consistent with emphysema. Improved FEV1 with bronchodilators     Assessment & Plan:   Discussion:  77 year old male with moderately severe COPD, hx esophageal CA s/p esophagectomy, hx CABG and PVD on DAPT who presents for follow-up. Outpatient exacerbations x 2. Currently asymptomatic. Discussed clinical course and management of COPD/asthma including bronchodilator regimen, preventive care including vaccinations and action plan for exacerbation.  Failed Bevespi - ineffective  Moderately severe COPD (FEV1 59%): GOLD Class B, mMRC <2 --CONTINUE Anoro ONE puff ONCE a day. REFILL --CONTINUE Albuterol TWO puffs every 6 hours as needed for shortness of breath or wheezing. REFILL --CONTINUE montelukast 10 mg daily. REFILL --CONTINUE Albuterol as needed for shortness of breath or wheezing  RLL lung nodule: Stable 8mm nodule, unchanged since 2015 No further imaging indicated   Health Maintenance Immunization History  Administered Date(s) Administered   Fluad Quad(high Dose 65+) 04/23/2019, 04/20/2021   Influenza Inj Mdck Quad Pf 04/17/2016   Influenza Split 05/29/2010   Influenza, High Dose Seasonal PF 04/19/2015, 04/17/2016, 05/09/2017,  04/21/2018, 04/19/2019   Influenza, Quadrivalent, Recombinant, Inj, Pf 04/17/2016, 02/17/2020   Influenza,inj,Quad PF,6+ Mos 04/19/2015, 04/17/2016   Influenza,inj,quad, With Preservative 02/17/2020   Influenza,trivalent, recombinat, inj, PF 05/29/2010   Influenza-Unspecified 05/29/2010, 05/01/2014   Moderna Sars-Covid-2 Vaccination 08/23/2019, 09/21/2019, 05/16/2020, 07/09/2020, 12/26/2020   Pneumococcal Conjugate-13 04/01/2014, 04/01/2016   Pneumococcal Polysaccharide-23 04/17/2016   Respiratory Syncytial Virus Vaccine,Recomb Aduvanted(Arexvy) 04/11/2023   Td 05/17/2012   CT Lung Screen - Not qualified  No orders of the defined types were placed in this encounter.  Meds ordered this encounter  Medications  DISCONTD: umeclidinium-vilanterol (ANORO ELLIPTA) 62.5-25 MCG/ACT AEPB    Sig: Inhale 1 puff into the lungs daily.    Dispense:  60 each    Refill:  5   umeclidinium-vilanterol (ANORO ELLIPTA) 62.5-25 MCG/ACT AEPB    Sig: Inhale 1 puff into the lungs daily.    Dispense:  60 each    Refill:  11   montelukast (SINGULAIR) 10 MG tablet    Sig: Take 1 tablet (10 mg total) by mouth daily.    Dispense:  90 tablet    Refill:  3   albuterol (VENTOLIN HFA) 108 (90 Base) MCG/ACT inhaler    Sig: Inhale 2 puffs into the lungs every 6 (six) hours as needed for wheezing or shortness of breath.    Dispense:  8.5 g    Refill:  3   Return in about 1 year (around 04/10/2024).   I have spent a total time of 32-minutes on the day of the appointment including chart review, data review, collecting history, coordinating care and discussing medical diagnosis and plan with the patient/family. Past medical history, allergies, medications were reviewed. Pertinent imaging, labs and tests included in this note have been reviewed and interpreted independently by me.  Rohen Kimes Mechele Collin, MD [facility] 04/11/2023 9:31 PM  Office Number [phone]

## 2023-04-11 NOTE — Patient Instructions (Signed)
Moderately severe COPD (FEV1 59%): GOLD Class B, mMRC <2 --CONTINUE Anoro ONE puff ONCE a day. REFILL --CONTINUE Albuterol TWO puffs every 6 hours as needed for shortness of breath or wheezing. REFILL --CONTINUE montelukast 10 mg daily. REFILL --CONTINUE Albuterol as needed for shortness of breath or wheezing

## 2023-05-06 ENCOUNTER — Encounter (HOSPITAL_COMMUNITY): Payer: Medicare Other

## 2023-05-06 ENCOUNTER — Other Ambulatory Visit (HOSPITAL_COMMUNITY): Payer: Medicare Other

## 2023-05-06 ENCOUNTER — Ambulatory Visit: Payer: Medicare Other | Admitting: Vascular Surgery

## 2023-05-09 ENCOUNTER — Encounter: Payer: Self-pay | Admitting: Cardiology

## 2023-05-09 ENCOUNTER — Ambulatory Visit: Payer: Medicare Other | Attending: Cardiology | Admitting: Cardiology

## 2023-05-09 VITALS — BP 150/75 | HR 88 | Ht 68.0 in | Wt 154.6 lb

## 2023-05-09 DIAGNOSIS — J449 Chronic obstructive pulmonary disease, unspecified: Secondary | ICD-10-CM | POA: Diagnosis not present

## 2023-05-09 DIAGNOSIS — J069 Acute upper respiratory infection, unspecified: Secondary | ICD-10-CM | POA: Diagnosis not present

## 2023-05-09 DIAGNOSIS — I25118 Atherosclerotic heart disease of native coronary artery with other forms of angina pectoris: Secondary | ICD-10-CM | POA: Diagnosis not present

## 2023-05-09 DIAGNOSIS — I10 Essential (primary) hypertension: Secondary | ICD-10-CM

## 2023-05-09 DIAGNOSIS — I2510 Atherosclerotic heart disease of native coronary artery without angina pectoris: Secondary | ICD-10-CM | POA: Diagnosis not present

## 2023-05-09 DIAGNOSIS — E782 Mixed hyperlipidemia: Secondary | ICD-10-CM

## 2023-05-09 NOTE — Progress Notes (Signed)
Clinical Summary Mr. Kuethe is a 77 y.o.male seen today for follow up of the following medical problems.   Previously followed by Dr Tresa Endo   1.CAD - He is s/p CABG in 1998 with low-risk NST in 07/2013 - 01/2021 nuclear stress: mild apical inferior ischemia, low risk study - 12/2020 echo: LVEF 55-60%, no WMAs, grade Idd   - ER eval Jan 2023 with chest pain, thought to be GERD. Trops neg, EKG without acute ischemic changes. Symptoms improved on antacid    -no recent chest pains, chronic SOB he attributes to his COPD - compilant with meds     2. Palpitations - episode 1 week ago felt like heart was racing.  - woke him up from sleep -episode last 15-20 minutes.  - no recurrent episodes.  - coffee x 2 cups, no sodas, rare tea, no energy drinks, no EtOH   - denies significant palpitations.    3, Dizziness - per his report had low bp's with standing. - ongonig symptoms. Episode just yesterday. Was climbing a ladder. Suddenly felt lightheaded, nauseous. Sat down and rested.  - drinks 2-3 water bottles a day, 2 cups of coffee. Has prn lasix has not taken in months. Seldomly takes muscle releaxer, takes gabapentin few times a week.     - no recent symptoms.    4. Syncope - admission 12/2020 with syncope - thought to be due to hypovolemia/dehydration, was hypotesiven SBPs to 60s resolved with IVFs. Aldactone was stopped at that time.    - no recent symptoms     5. PAD - He is s/p fem-pop bypass in 1993 and known occluded right SFA.  - prior bilateral common iliac artery stenting, bilateral external iliac artery stenting. - 03/2021 right iliofemoral endarectomy - 04/2021 left femoral below knee pop bypass - 08/2022 angioplasty right external iliac artery stent, covered stenting right exerternal iliac   - on extended DAPT managed by vascular   6. HTN - compliant with meds   - home bp's 120s/60s, compliant with meds - has been on prednisone recently    7. Hyperliidemia -  10/2020 TC 90 TG 94 HDL 41 LDL 31 - Jan 2024 TC 123 TG 148 HDL 43 LDL 55 - 02/2023 TC 161 TG 096 HDL 38 LDL 46   8. Severe COPD - followed by pulmionary - mixed compliance with inhalers  - recent cough, SOB, wheezing - has pcp appt today   8. History of esopaghael cancer     9. GERD - followed by GI Past Medical History:  Diagnosis Date   Adenomatous colon polyp 03/2009   Last colonoscopy by Dr. Jena Gauss    Adenomatous polyp 2010   Adenomatous polyp of colon 11/03/2010   Arthritis    Barrett's esophagus    CAD (coronary artery disease)    Complication of anesthesia    has a shortened esophogus due to CA.    COPD (chronic obstructive pulmonary disease) (HCC)    Severe emphysema per CT   Diverticulosis    Emphysema of lung (HCC)    Esophageal carcinoma (HCC) 03/2009   T1N1M0   GERD (gastroesophageal reflux disease)    Glaucoma    History of Doppler ultrasound 11/09/2011   03/2014- 50-69% L ICA stenosis;carotid doppler; L bulb/prox ICA 0-49% diameter reduction; L vertebral artery - occlusive ds; L ECA  demonstrates severe amount of fibrous plaque   History of Doppler ultrasound 11/09/2011   LEAs; R ABI - mod art. insuff.; L  ABI normal at rest; R SFA - occlusive ds; L SFA - occlusive ds; patent fem-pop graft   History of echocardiogram 08/27/2009   EF >55%   History of hiatal hernia    History of kidney stones 1965   History of nuclear stress test 11/24/2011   lexiscan; normal perfusion; low risk scan; non-diagnostic for ischemia   Hyperlipidemia    Hypertension    Left carotid artery stenosis 04/08/2014   Pneumonia    Pre-diabetes    Pulmonary nodule, right 04/08/2014   2.8 mm-incidental finding on CT   PVD (peripheral vascular disease) (HCC)    Tachyarrhythmia 1999   Status post ablation at [facility]   Tobacco abuse      Allergies  Allergen Reactions   Altace [Ramipril] Cough     Current Outpatient Medications  Medication Sig Dispense Refill   acetaminophen  (TYLENOL) 500 MG tablet Take 1,000 mg by mouth daily as needed for headache.     albuterol (PROVENTIL) (2.5 MG/3ML) 0.083% nebulizer solution USE ONE VIAL (2.5MG  TOTAL) IN NEBULIZER EVERY SIX HOURS AS NEEDED FOR WHEEZING OR SHORTNESS OF BREATH 75 mL 6   albuterol (VENTOLIN HFA) 108 (90 Base) MCG/ACT inhaler Inhale 2 puffs into the lungs every 6 (six) hours as needed for wheezing or shortness of breath. 8.5 g 3   ALPRAZolam (XANAX) 0.5 MG tablet Take 1 tablet (0.5 mg total) by mouth at bedtime. 30 tablet 0   amLODipine (NORVASC) 10 MG tablet Take 10 mg by mouth daily.     aspirin EC 81 MG tablet Take 1 tablet (81 mg total) by mouth daily. Swallow whole. 150 tablet 2   baclofen (LIORESAL) 10 MG tablet Take 1 tablet (10 mg total) by mouth 3 (three) times daily as needed for muscle spasms. 20 each 0   clopidogrel (PLAVIX) 75 MG tablet TAKE ONE (1) TABLET BY MOUTH EVERY DAY 90 tablet 0   dorzolamide-timolol (COSOPT) 2-0.5 % ophthalmic solution Place 1 drop into both eyes 2 (two) times daily.     gabapentin (NEURONTIN) 100 MG capsule Take 100 mg by mouth 3 (three) times daily as needed for pain.     lansoprazole (PREVACID) 30 MG capsule Take 1 capsule (30 mg total) by mouth daily before supper. 90 capsule 3   losartan (COZAAR) 100 MG tablet TAKE ONE TABLET BY MOUTH EVERY NIGHT AT BEDTIME 90 tablet 3   metoprolol succinate (TOPROL-XL) 50 MG 24 hr tablet TAKE ONE (1) TABLET BY MOUTH EVERY DAY 90 tablet 1   montelukast (SINGULAIR) 10 MG tablet Take 1 tablet (10 mg total) by mouth daily. 90 tablet 3   Multiple Vitamins-Minerals (CENTRUM SILVER PO) Take 1 tablet by mouth daily.     nitroGLYCERIN (NITROSTAT) 0.4 MG SL tablet Place 1 tablet (0.4 mg total) under the tongue every 5 (five) minutes as needed for chest pain. 25 tablet 0   rosuvastatin (CRESTOR) 10 MG tablet Take 10 mg by mouth daily.     spironolactone (ALDACTONE) 25 MG tablet Take 0.5 tablets (12.5 mg total) by mouth daily. 45 tablet 3   sucralfate  (CARAFATE) 1 g tablet Take 1 tablet (1 g total) by mouth 4 (four) times daily -  with meals and at bedtime. As needed for esophageal pain, heartburn. You can crush in couple of tablespoons of water to make suspension. 30 tablet 5   umeclidinium-vilanterol (ANORO ELLIPTA) 62.5-25 MCG/ACT AEPB Inhale 1 puff into the lungs daily. 60 each 11   No current facility-administered medications  for this visit.     Past Surgical History:  Procedure Laterality Date   ABDOMINAL AORTOGRAM W/LOWER EXTREMITY N/A 04/01/2021   Procedure: ABDOMINAL AORTOGRAM W/LOWER EXTREMITY;  Surgeon: Victorino Sparrow, MD;  Location: [facility] INVASIVE CV LAB;  Service: Cardiovascular;  Laterality: N/A;   ABDOMINAL AORTOGRAM W/LOWER EXTREMITY N/A 09/01/2022   Procedure: ABDOMINAL AORTOGRAM W/LOWER EXTREMITY;  Surgeon: Victorino Sparrow, MD;  Location: [facility] INVASIVE CV LAB;  Service: Cardiovascular;  Laterality: N/A;   BIOPSY  10/24/2017   Procedure: BIOPSY;  Surgeon: Corbin Ade, MD;  Location: [facility]O SUITE;  Service: Endoscopy;;  esophagus   BIOPSY  10/22/2021   Procedure: BIOPSY;  Surgeon: Corbin Ade, MD;  Location: [facility];  Service: Endoscopy;;   CATARACT EXTRACTION Bilateral    COLONOSCOPY  03/17/2009   Dr.Rourk- normal rectum, sigmoid diverticula, some pale sigmoid mucosa with diffuse petechiae. pedunculated polyp at the splenic flexure, remainder of colonic mucosa appeared normal. bx= adenomatous polyp   COLONOSCOPY  04/19/2003   Dr.Rehman- few diverticu;a at the sigmoid colon, 3 small polyps, one at the transverse colon and 2 at the sigmoid, small external hemorrhoids. bx report not available.    COLONOSCOPY WITH PROPOFOL N/A 10/24/2017   Dr. Jena Gauss: Diverticulosis, 11 mm polyp at the ileocecal valve, tubular adenoma.  Repeat colonoscopy in 3 years   COLONOSCOPY WITH PROPOFOL N/A 10/22/2021   Procedure: COLONOSCOPY WITH PROPOFOL;  Surgeon: Corbin Ade, MD;  Location: [facility];  Service: Endoscopy;   Laterality: N/A;  11:00am   CORONARY ARTERY BYPASS GRAFT  1998   [name]   ENDARTERECTOMY FEMORAL Right 04/13/2021   Procedure: RIGHT ILIOFEMORAL ARTERY ENDARTERECTOMY WITH PATCH ANGIOPLASTY USING HEMASHIELD PALTINUM FINESSE DACRON PATCH;  Surgeon: Victorino Sparrow, MD;  Location: [facility] OR;  Service: Vascular;  Laterality: Right;   ESOPHAGECTOMY  2010   [facility] Dr. [name]   ESOPHAGOGASTRODUODENOSCOPY  11/25/2010   Dr.Rourk- s/p esophagectomy with gastric pull-up, esophageal erosions straddling the surgical anastomosis, salmon colored epithelium coming up a good centimeter to a centimeter and a half above the suture line, islands of salmon colored epithelium in the most poximal residual esophagus, remainder of gastric mucosa appeared normal. bx= swamous &gastric glandular mucosa w/chronic active inflammation   ESOPHAGOGASTRODUODENOSCOPY  03/17/2009   Dr.Rourk- 4cm segment of salmon-colored epithlium distal esophagus suspicious for barretts esophagus. area of suspicious nodularity w/in this segment bx seperately. small to moderate size hiatal hernia, o/w normal stomach D1 and D2 bx=adenocarcinoma   ESOPHAGOGASTRODUODENOSCOPY (EGD) WITH PROPOFOL N/A 10/24/2017   Dr. Jena Gauss: Remnant of esophagus with anastomosis with stomach at 24 cm from the incisors, 2 cm above the anastomosis he was found to have Barrett's but no dysplasia.  Advised for repeat EGD in 3 years   ESOPHAGOGASTRODUODENOSCOPY (EGD) WITH PROPOFOL N/A 10/22/2021   Procedure: ESOPHAGOGASTRODUODENOSCOPY (EGD) WITH PROPOFOL;  Surgeon: Corbin Ade, MD;  Location: [facility];  Service: Endoscopy;  Laterality: N/A;   EYE SURGERY     cataract   FEMORAL-POPLITEAL BYPASS GRAFT  1993   occlusive ds in R SFA   FEMORAL-POPLITEAL BYPASS GRAFT Left 05/11/2021   Procedure: LEFT FEMORAL-BELOW KNEE POPLITEAL ARTERY BYPASS GRAFT WITH 6 mm PTFE and COMPLETION ANGIOGRAM;  Surgeon: Victorino Sparrow, MD;  Location: [facility] OR;  Service: Vascular;   Laterality: Left;   GASTRIC PULL THROUGH  2010   With esophagectomy   INSERTION OF ILIAC STENT Bilateral 04/13/2021   Procedure: INSERTION OF BILATERAL COMMON ILIAC KISSING STENTS AND INSERION  OF RIGHT EXTERNAL ILIAC STENT;  Surgeon: Victorino Sparrow, MD;  Location: [facility] OR;  Service: Vascular;  Laterality: Bilateral;   JOINT REPLACEMENT     LIPOMA EXCISION  2010   LOWER EXTREMITY ANGIOGRAM Left 04/13/2021   Procedure: LEFT LOWER EXTREMITY ANGIOGRAM ;  Surgeon: Victorino Sparrow, MD;  Location: [facility] OR;  Service: Vascular;  Laterality: Left;   PERIPHERAL VASCULAR INTERVENTION  04/01/2021   Procedure: PERIPHERAL VASCULAR INTERVENTION;  Surgeon: Victorino Sparrow, MD;  Location: [facility] INVASIVE CV LAB;  Service: Cardiovascular;;   PERIPHERAL VASCULAR INTERVENTION Right 09/01/2022   Procedure: PERIPHERAL VASCULAR INTERVENTION;  Surgeon: Victorino Sparrow, MD;  Location: [facility] INVASIVE CV LAB;  Service: Cardiovascular;  Laterality: Right;  right external iliac   POLYPECTOMY  10/24/2017   Procedure: POLYPECTOMY;  Surgeon: Corbin Ade, MD;  Location: [facility];  Service: Endoscopy;;  colon    POLYPECTOMY  10/22/2021   Procedure: POLYPECTOMY;  Surgeon: Corbin Ade, MD;  Location: [facility];  Service: Endoscopy;;   TOTAL HIP ARTHROPLASTY Left 01/30/2019   Procedure: TOTAL HIP ARTHROPLASTY ANTERIOR APPROACH;  Surgeon: Sheral Apley, MD;  Location: [facility] ORS;  Service: Orthopedics;  Laterality: Left;   ULTRASOUND GUIDANCE FOR VASCULAR ACCESS Left 04/13/2021   Procedure: ULTRASOUND GUIDANCE FOR VASCULAR ACCESS, left femoral;  Surgeon: Victorino Sparrow, MD;  Location: [facility] OR;  Service: Vascular;  Laterality: Left;     Allergies  Allergen Reactions   Altace [Ramipril] Cough      Family History  Problem Relation Age of Onset   GER disease Mother    Coronary artery disease Brother    Congenital heart disease Sister    Colon cancer Neg Hx      Social History Mr. Stoneburner reports that he quit  smoking about 27 years ago. His smoking use included cigarettes. He started smoking about 67 years ago. He has a 80 pack-year smoking history. He has never been exposed to tobacco smoke. He has never used smokeless tobacco. Mr. Wools reports no history of alcohol use.   Review of Systems CONSTITUTIONAL: No weight loss, fever, chills, weakness or fatigue.  HEENT: Eyes: No visual loss, blurred vision, double vision or yellow sclerae.No hearing loss, sneezing, congestion, runny nose or sore throat.  SKIN: No rash or itching.  CARDIOVASCULAR: per hpi RESPIRATORY: per hpi GASTROINTESTINAL: No anorexia, nausea, vomiting or diarrhea. No abdominal pain or blood.  GENITOURINARY: No burning on urination, no polyuria NEUROLOGICAL: No headache, dizziness, syncope, paralysis, ataxia, numbness or tingling in the extremities. No change in bowel or bladder control.  MUSCULOSKELETAL: No muscle, back pain, joint pain or stiffness.  LYMPHATICS: No enlarged nodes. No history of splenectomy.  PSYCHIATRIC: No history of depression or anxiety.  ENDOCRINOLOGIC: No reports of sweating, cold or heat intolerance. No polyuria or polydipsia.  Marland Kitchen   Physical Examination Vitals:   05/09/23 0822 05/09/23 0839  BP: (!) 174/80 (!) 150/75  Pulse:    SpO2:     Filed Weights   05/09/23 0810  Weight: 154 lb 9.6 oz (70.1 kg)    Gen: resting comfortably, no acute distress HEENT: no scleral icterus, pupils equal round and reactive, no palptable cervical adenopathy,  CV: RRR, no m/rg, no jvd Resp: faint wheezing bilaterally GI: abdomen is soft, non-tender, non-distended, normal bowel sounds, no hepatosplenomegaly MSK: extremities are warm, no edema.  Skin: warm, no rash Neuro:  no focal deficits Psych: appropriate affect   Diagnostic Studies  01/2021 nuclear stress The left  ventricular ejection fraction is normal (55-65%). Nuclear stress EF: 65%. There was no ST segment deviation noted during stress. No T wave  inversion was noted during stress. Defect 1: There is a small defect of mild severity present in the apical inferior location. Findings consistent with a small region of ischemia. This is a low risk study.         Assessment and Plan  CAD  - 01/2021 nuclear stress was low risk, though very small area of ischemia - no recent chest pains, some SOB more related to COPD and current exacerbation - continue current meds  2. Hyperlipidemia -at goal, continue current meds   3. HTN - elevated here but home numbers at goal. Also has been no prednisone last few days - careful bp mamagenet due to prior orthostatic symptoms - continue current regimen.        Antoine Poche, M.D.

## 2023-05-09 NOTE — Patient Instructions (Signed)
Medication Instructions:  Your physician recommends that you continue on your current medications as directed. Please refer to the Current Medication list given to you today.  *If you need a refill on your cardiac medications before your next appointment, please call your pharmacy*   Lab Work: None If you have labs (blood work) drawn today and your tests are completely normal, you will receive your results only by: MyChart Message (if you have MyChart) OR A paper copy in the mail If you have any lab test that is abnormal or we need to change your treatment, we will call you to review the results.   Testing/Procedures: None   Follow-Up: At [facility], you and your health needs are our priority.  As part of our continuing mission to provide you with exceptional heart care, we have created designated Provider Care Teams.  These Care Teams include your primary Cardiologist (physician) and Advanced Practice Providers (APPs -  Physician Assistants and Nurse Practitioners) who all work together to provide you with the care you need, when you need it.  We recommend signing up for the patient portal called "MyChart".  Sign up information is provided on this After Visit Summary.  MyChart is used to connect with patients for Virtual Visits (Telemedicine).  Patients are able to view lab/test results, encounter notes, upcoming appointments, etc.  Non-urgent messages can be sent to your provider as well.   To learn more about what you can do with MyChart, go to [email or web address].    Your next appointment:   6 month(s)  Provider:   Jonathan Branch, MD    Other Instructions    

## 2023-05-10 NOTE — Progress Notes (Unsigned)
Office Note     CC:  follow up Requesting Provider:  Benita Stabile, MD  HPI: Gregory Eaton is a 77 y.o. (07/16/1946) male who presents for surveillance of PAD.  Surgical history significant for left femoral to below the knee popliteal bypass with ringed PTFE on 05/11/2021.  He has also had multiple interventions including right iliofemoral endarterectomy with bilateral common iliac stents and right external iliac stent on 04/11/2021.  He also had left external iliac artery stenting on 04/03/2021.    Most recently abdominal angiogram with drug-coated balloon angioplasty and extension of right iliac stents 09/01/2022  He denies any claudication, rest pain, or tissue loss.  He is on Plavix and statin daily.  He denies tobacco use. He is also followed for carotid artery stenosis.  He does not have any strokelike symptoms including slurring speech, changes in vision, or one-sided weakness.  He recently had a headache, and I asked if this could be from his carotid arteries.  Past Medical History:  Diagnosis Date   Adenomatous colon polyp 03/2009   Last colonoscopy by Dr. Jena Gauss    Adenomatous polyp 2010   Adenomatous polyp of colon 11/03/2010   Arthritis    Barrett's esophagus    CAD (coronary artery disease)    Complication of anesthesia    has a shortened esophogus due to CA.    COPD (chronic obstructive pulmonary disease) (HCC)    Severe emphysema per CT   Diverticulosis    Emphysema of lung (HCC)    Esophageal carcinoma (HCC) 03/2009   T1N1M0   GERD (gastroesophageal reflux disease)    Glaucoma    History of Doppler ultrasound 11/09/2011   03/2014- 50-69% L ICA stenosis;carotid doppler; L bulb/prox ICA 0-49% diameter reduction; L vertebral artery - occlusive ds; L ECA  demonstrates severe amount of fibrous plaque   History of Doppler ultrasound 11/09/2011   LEAs; R ABI - mod art. insuff.; L ABI normal at rest; R SFA - occlusive ds; L SFA - occlusive ds; patent fem-pop graft   History  of echocardiogram 08/27/2009   EF >55%   History of hiatal hernia    History of kidney stones 1965   History of nuclear stress test 11/24/2011   lexiscan; normal perfusion; low risk scan; non-diagnostic for ischemia   Hyperlipidemia    Hypertension    Left carotid artery stenosis 04/08/2014   Pneumonia    Pre-diabetes    Pulmonary nodule, right 04/08/2014   2.8 mm-incidental finding on CT   PVD (peripheral vascular disease) (HCC)    Tachyarrhythmia 1999   Status post ablation at [facility]   Tobacco abuse     Past Surgical History:  Procedure Laterality Date   ABDOMINAL AORTOGRAM W/LOWER EXTREMITY N/A 04/01/2021   Procedure: ABDOMINAL AORTOGRAM W/LOWER EXTREMITY;  Surgeon: Victorino Sparrow, MD;  Location: [facility] INVASIVE CV LAB;  Service: Cardiovascular;  Laterality: N/A;   ABDOMINAL AORTOGRAM W/LOWER EXTREMITY N/A 09/01/2022   Procedure: ABDOMINAL AORTOGRAM W/LOWER EXTREMITY;  Surgeon: Victorino Sparrow, MD;  Location: [facility] INVASIVE CV LAB;  Service: Cardiovascular;  Laterality: N/A;   BIOPSY  10/24/2017   Procedure: BIOPSY;  Surgeon: Corbin Ade, MD;  Location: [facility];  Service: Endoscopy;;  esophagus   BIOPSY  10/22/2021   Procedure: BIOPSY;  Surgeon: Corbin Ade, MD;  [facility]: [facility];  Service: Endoscopy;;   CATARACT EXTRACTION Bilateral    COLONOSCOPY  03/17/2009   Dr.Rourk- normal rectum, sigmoid diverticula, some pale sigmoid  mucosa with diffuse petechiae. pedunculated polyp at the splenic flexure, remainder of colonic mucosa appeared normal. bx= adenomatous polyp   COLONOSCOPY  04/19/2003   Dr.Rehman- few diverticu;a at the sigmoid colon, 3 small polyps, one at the transverse colon and 2 at the sigmoid, small external hemorrhoids. bx report not available.    COLONOSCOPY WITH PROPOFOL N/A 10/24/2017   Dr. Jena Gauss: Diverticulosis, 11 mm polyp at the ileocecal valve, tubular adenoma.  Repeat colonoscopy in 3 years   COLONOSCOPY WITH PROPOFOL N/A 10/22/2021   Procedure:  COLONOSCOPY WITH PROPOFOL;  Surgeon: Corbin Ade, MD;  [facility];  Service: Endoscopy;  Laterality: N/A;  11:00am   CORONARY ARTERY BYPASS GRAFT  1998   [name]   ENDARTERECTOMY FEMORAL Right 04/13/2021   Procedure: RIGHT ILIOFEMORAL ARTERY ENDARTERECTOMY WITH PATCH ANGIOPLASTY USING HEMASHIELD PALTINUM FINESSE DACRON PATCH;  Surgeon: Victorino Sparrow, MD;  Location: [facility] OR;  Service: Vascular;  Laterality: Right;   ESOPHAGECTOMY  2010   [facility] Dr. [name]   ESOPHAGOGASTRODUODENOSCOPY  11/25/2010   Dr.Rourk- s/p esophagectomy with gastric pull-up, esophageal erosions straddling the surgical anastomosis, salmon colored epithelium coming up a good centimeter to a centimeter and a half above the suture line, islands of salmon colored epithelium in the most poximal residual esophagus, remainder of gastric mucosa appeared normal. bx= swamous &gastric glandular mucosa w/chronic active inflammation   ESOPHAGOGASTRODUODENOSCOPY  03/17/2009   Dr.Rourk- 4cm segment of salmon-colored epithlium distal esophagus suspicious for barretts esophagus. area of suspicious nodularity w/in this segment bx seperately. small to moderate size hiatal hernia, o/w normal stomach D1 and D2 bx=adenocarcinoma   ESOPHAGOGASTRODUODENOSCOPY (EGD) WITH PROPOFOL N/A 10/24/2017   Dr. Jena Gauss: Remnant of esophagus with anastomosis with stomach at 24 cm from the incisors, 2 cm above the anastomosis he was found to have Barrett's but no dysplasia.  Advised for repeat EGD in 3 years   ESOPHAGOGASTRODUODENOSCOPY (EGD) WITH PROPOFOL N/A 10/22/2021   Procedure: ESOPHAGOGASTRODUODENOSCOPY (EGD) WITH PROPOFOL;  Surgeon: Corbin Ade, MD;  [facility];  Service: Endoscopy;  Laterality: N/A;   EYE SURGERY     cataract   FEMORAL-POPLITEAL BYPASS GRAFT  1993   occlusive ds in R SFA   FEMORAL-POPLITEAL BYPASS GRAFT Left 05/11/2021   Procedure: LEFT FEMORAL-BELOW KNEE POPLITEAL ARTERY BYPASS GRAFT WITH 6 mm PTFE  and COMPLETION ANGIOGRAM;  Surgeon: Victorino Sparrow, MD;  Location: [facility] OR;  Service: Vascular;  Laterality: Left;   GASTRIC PULL THROUGH  2010   With esophagectomy   INSERTION OF ILIAC STENT Bilateral 04/13/2021   Procedure: INSERTION OF BILATERAL COMMON ILIAC KISSING STENTS AND INSERION OF RIGHT EXTERNAL ILIAC STENT;  Surgeon: Victorino Sparrow, MD;  Location: [facility] OR;  Service: Vascular;  Laterality: Bilateral;   JOINT REPLACEMENT     LIPOMA EXCISION  2010   LOWER EXTREMITY ANGIOGRAM Left 04/13/2021   Procedure: LEFT LOWER EXTREMITY ANGIOGRAM ;  Surgeon: Victorino Sparrow, MD;  Location: [facility] OR;  Service: Vascular;  Laterality: Left;   PERIPHERAL VASCULAR INTERVENTION  04/01/2021   Procedure: PERIPHERAL VASCULAR INTERVENTION;  Surgeon: Victorino Sparrow, MD;  Location: [facility] INVASIVE CV LAB;  Service: Cardiovascular;;   PERIPHERAL VASCULAR INTERVENTION Right 09/01/2022   Procedure: PERIPHERAL VASCULAR INTERVENTION;  Surgeon: Victorino Sparrow, MD;  Location: [facility] INVASIVE CV LAB;  Service: Cardiovascular;  Laterality: Right;  right external iliac   POLYPECTOMY  10/24/2017   Procedure: POLYPECTOMY;  Surgeon: Corbin Ade, MD;  [facility];  Service:  Endoscopy;;  colon    POLYPECTOMY  10/22/2021   Procedure: POLYPECTOMY;  Surgeon: Corbin Ade, MD;  [facility];  Service: Endoscopy;;   TOTAL HIP ARTHROPLASTY Left 01/30/2019   Procedure: TOTAL HIP ARTHROPLASTY ANTERIOR APPROACH;  Surgeon: Sheral Apley, MD;  Location: [facility] ORS;  Service: Orthopedics;  Laterality: Left;   ULTRASOUND GUIDANCE FOR VASCULAR ACCESS Left 04/13/2021   Procedure: ULTRASOUND GUIDANCE FOR VASCULAR ACCESS, left femoral;  Surgeon: Victorino Sparrow, MD;  Location: [facility] OR;  Service: Vascular;  Laterality: Left;    Social History   Socioeconomic History   Marital status: Married    Spouse name: Not on file   Number of children: 2   Years of education: Not on file   Highest education level: Not on file   Occupational History   Occupation: retired    Associate Professor: RETIRED    Comment: truck driver  Tobacco Use   Smoking status: Former    Current packs/day: 0.00    Average packs/day: 2.0 packs/day for 40.0 years (80.0 ttl pk-yrs)    Types: Cigarettes    Start date: 68    Quit date: 07/20/1995    Years since quitting: 27.8    Passive exposure: Never   Smokeless tobacco: Never  Vaping Use   Vaping status: Never Used  Substance and Sexual Activity   Alcohol use: No    Alcohol/week: 0.0 standard drinks of alcohol   Drug use: No   Sexual activity: Yes    Partners: Female    Birth control/protection: Condom    Comment: friend  Other Topics Concern   Not on file  Social History Narrative   Not on file   Social Determinants of Health   Financial Resource Strain: Low Risk  (12/02/2021)   Received from [facility]   Overall Financial Resource Strain (CARDIA)    Difficulty of Paying Living Expenses: Not hard at all  Food Insecurity: No Food Insecurity (12/02/2021)   Received from [facility], [facility]   Hunger Vital Sign    Worried About Running Out of Food in the Last Year: Never true    Ran Out of Food in the Last Year: Never true  Transportation Needs: No Transportation Needs (11/11/2020)   Received from [facility]   PRAPARE - Transportation    Lack of Transportation (Medical): No    Lack of Transportation (Non-Medical): No  Physical Activity: Inactive (12/02/2021)   Received from [facility]   Exercise Vital Sign    Days of Exercise per Week: 0 days    Minutes of Exercise per Session: 0 min  Stress: No Stress Concern Present (12/02/2021)   Received from [facility] - Occupational Stress Questionnaire    Feeling of Stress : Not at all  Social Connections: Unknown (12/04/2022)   Received from [facility], [facility]   Social Network    Social Network: Not on  file  Intimate Partner Violence: Unknown (12/04/2022)   Received from [facility], [facility]   HITS    Physically Hurt: Not on file    Insult or Talk Down To: Not on file    Threaten Physical Harm: Not on file    Scream or Curse: Not on file    Family History  Problem Relation Age of Onset   GER disease Mother    Coronary artery disease Brother    Congenital heart disease Sister  Colon cancer Neg Hx     Current Outpatient Medications  Medication Sig Dispense Refill   acetaminophen (TYLENOL) 500 MG tablet Take 1,000 mg by mouth daily as needed for headache.     albuterol (PROVENTIL) (2.5 MG/3ML) 0.083% nebulizer solution USE ONE VIAL (2.5MG  TOTAL) IN NEBULIZER EVERY SIX HOURS AS NEEDED FOR WHEEZING OR SHORTNESS OF BREATH 75 mL 6   albuterol (VENTOLIN HFA) 108 (90 Base) MCG/ACT inhaler Inhale 2 puffs into the lungs every 6 (six) hours as needed for wheezing or shortness of breath. 8.5 g 3   ALPRAZolam (XANAX) 0.5 MG tablet Take 1 tablet (0.5 mg total) by mouth at bedtime. 30 tablet 0   amLODipine (NORVASC) 10 MG tablet Take 10 mg by mouth daily.     aspirin EC 81 MG tablet Take 1 tablet (81 mg total) by mouth daily. Swallow whole. 150 tablet 2   baclofen (LIORESAL) 10 MG tablet Take 1 tablet (10 mg total) by mouth 3 (three) times daily as needed for muscle spasms. 20 each 0   clopidogrel (PLAVIX) 75 MG tablet TAKE ONE (1) TABLET BY MOUTH EVERY DAY 90 tablet 0   dorzolamide-timolol (COSOPT) 2-0.5 % ophthalmic solution Place 1 drop into both eyes 2 (two) times daily.     gabapentin (NEURONTIN) 100 MG capsule Take 100 mg by mouth 3 (three) times daily as needed for pain.     lansoprazole (PREVACID) 30 MG capsule Take 1 capsule (30 mg total) by mouth daily before supper. 90 capsule 3   losartan (COZAAR) 100 MG tablet TAKE ONE TABLET BY MOUTH EVERY NIGHT AT BEDTIME 90 tablet 3   metoprolol succinate (TOPROL-XL) 50 MG 24 hr tablet TAKE ONE (1) TABLET BY MOUTH EVERY DAY 90 tablet 1    montelukast (SINGULAIR) 10 MG tablet Take 1 tablet (10 mg total) by mouth daily. 90 tablet 3   Multiple Vitamins-Minerals (CENTRUM SILVER PO) Take 1 tablet by mouth daily.     nitroGLYCERIN (NITROSTAT) 0.4 MG SL tablet Place 1 tablet (0.4 mg total) under the tongue every 5 (five) minutes as needed for chest pain. 25 tablet 0   rosuvastatin (CRESTOR) 10 MG tablet Take 10 mg by mouth daily.     spironolactone (ALDACTONE) 25 MG tablet Take 0.5 tablets (12.5 mg total) by mouth daily. 45 tablet 3   sucralfate (CARAFATE) 1 g tablet Take 1 tablet (1 g total) by mouth 4 (four) times daily -  with meals and at bedtime. As needed for esophageal pain, heartburn. You can crush in couple of tablespoons of water to make suspension. 30 tablet 5   umeclidinium-vilanterol (ANORO ELLIPTA) 62.5-25 MCG/ACT AEPB Inhale 1 puff into the lungs daily. 60 each 11   No current facility-administered medications for this visit.    Allergies  Allergen Reactions   Altace [Ramipril] Cough     REVIEW OF SYSTEMS:   [X]  denotes positive finding, [ ]  denotes negative finding Cardiac  Comments:  Chest pain or chest pressure:    Shortness of breath upon exertion:    Short of breath when lying flat:    Irregular heart rhythm:        Vascular    Pain in calf, thigh, or hip brought on by ambulation:    Pain in feet at night that wakes you up from your sleep:     Blood clot in your veins:    Leg swelling:         Pulmonary    Oxygen at home:  Productive cough:     Wheezing:         Neurologic    Sudden weakness in arms or legs:     Sudden numbness in arms or legs:     Sudden onset of difficulty speaking or slurred speech:    Temporary loss of vision in one eye:     Problems with dizziness:         Gastrointestinal    Blood in stool:     Vomited blood:         Genitourinary    Burning when urinating:     Blood in urine:        Psychiatric    Major depression:         Hematologic    Bleeding problems:     Problems with blood clotting too easily:        Skin    Rashes or ulcers:        Constitutional    Fever or chills:      PHYSICAL EXAMINATION:  There were no vitals filed for this visit.   General:  WDWN in NAD; vital signs documented above Gait: Not observed HENT: WNL, normocephalic Pulmonary: normal non-labored breathing , without Rales, rhonchi,  wheezing Cardiac: regular HR Abdomen: soft, NT, no masses Skin: without rashes Vascular Exam/Pulses: Palpable left DP; symmetrical 2+ femoral pulses; absent right pedal pulses Extremities: without ischemic changes, without Gangrene , without cellulitis; without open wounds;  Musculoskeletal: no muscle wasting or atrophy  Neurologic: A&O X 3;  No focal weakness or paresthesias are detected Psychiatric:  The pt has Normal affect.   Non-Invasive Vascular Imaging:    Abdominal Aorta Findings:  +-------------+-------+----------+----------+--------+--------+--------+  Location    AP (cm)Trans (cm)PSV (cm/s)WaveformThrombusComments  +-------------+-------+----------+----------+--------+--------+--------+  RT CIA Prox                   77                        stent     +-------------+-------+----------+----------+--------+--------+--------+  RT CIA Distal                 131                       stent     +-------------+-------+----------+----------+--------+--------+--------+  RT EIA Prox                   206                       stent     +-------------+-------+----------+----------+--------+--------+--------+  RT EIA Mid                    243                       stent     +-------------+-------+----------+----------+--------+--------+--------+  RT EIA Distal                 305                       stent     +-------------+-------+----------+----------+--------+--------+--------+  LT CIA Prox                   55  stent      +-------------+-------+----------+----------+--------+--------+--------+  LT CIA Mid                    48                        stent     +-------------+-------+----------+----------+--------+--------+--------+  LT CIA Distal                 94                        stent     +-------------+-------+----------+----------+--------+--------+--------+  LT EIA Prox                   137                       stent     +-------------+-------+----------+----------+--------+--------+--------+  LT EIA Mid                    222                       stent     +-------------+-------+----------+----------+--------+--------+--------+  LT EIA Distal                 283                       stent     +-------------+-------+----------+----------+--------+--------+--------+   +-------+-----------+-----------+------------+------------+  ABI/TBIToday's ABIToday's TBIPrevious ABIPrevious TBI  +-------+-----------+-----------+------------+------------+  Right [place]         0.40       [place]          0.38          +-------+-----------+-----------+------------+------------+  Left  [place]         0.75       [place]          0.76          +-------+-----------+-----------+------------+------------+   Left Graft #1: femoropopliteal  +--------------------+--------+--------+--------+--------+                     PSV cm/sStenosisWaveformComments  +--------------------+--------+--------+--------+--------+  Inflow             141             biphasic          +--------------------+--------+--------+--------+--------+  Proximal Anastomosis74              biphasic          +--------------------+--------+--------+--------+--------+  Proximal Graft      57              biphasic          +--------------------+--------+--------+--------+--------+  Mid Graft           66              biphasic           +--------------------+--------+--------+--------+--------+  Distal Graft        69              biphasic          +--------------------+--------+--------+--------+--------+  Distal Anastomosis  88              biphasic          +--------------------+--------+--------+--------+--------+  Outflow            91  biphasic          +--------------------+--------+--------+--------+--------+     ASSESSMENT/PLAN:: 77 y.o. male here for follow up for surveillance of PAD and carotid artery stenosis. Most recent intervention was RLE angiogram for elevated velocities within the right external iliac stents. This resulted in balloon angioplasty and stent extension distally for primary assisted patency.   Over the last 3 months, Gregory Eaton has been doing well.  He has no complaints. ABI non compressible.  No change in toe pressure. On physical exam he had readily palpable pulses in bilateral common femoral arteries, excellent DP pulse in the left foot.   Carotids will be checked at his next visit in 3 months  Velocities at the right external iliac remain elevated - will observe at this time with close interval follow up (3 months).    Victorino Sparrow, MD [facility] [phone] Total time of patient care including pre-visit research, consultation, and documentation greater than 30 minutes

## 2023-05-11 DIAGNOSIS — H401111 Primary open-angle glaucoma, right eye, mild stage: Secondary | ICD-10-CM | POA: Diagnosis not present

## 2023-05-11 DIAGNOSIS — H401122 Primary open-angle glaucoma, left eye, moderate stage: Secondary | ICD-10-CM | POA: Diagnosis not present

## 2023-05-11 DIAGNOSIS — H01002 Unspecified blepharitis right lower eyelid: Secondary | ICD-10-CM | POA: Diagnosis not present

## 2023-05-11 DIAGNOSIS — H01001 Unspecified blepharitis right upper eyelid: Secondary | ICD-10-CM | POA: Diagnosis not present

## 2023-05-12 ENCOUNTER — Ambulatory Visit: Payer: Medicare Other | Admitting: Vascular Surgery

## 2023-05-12 ENCOUNTER — Ambulatory Visit (INDEPENDENT_AMBULATORY_CARE_PROVIDER_SITE_OTHER)
Admission: RE | Admit: 2023-05-12 | Discharge: 2023-05-12 | Disposition: A | Discharge status: Home / Self Care | Payer: Medicare Other | Source: Ambulatory Visit | Attending: Vascular Surgery | Admitting: Vascular Surgery

## 2023-05-12 ENCOUNTER — Other Ambulatory Visit (HOSPITAL_COMMUNITY): Payer: Self-pay | Admitting: Internal Medicine

## 2023-05-12 ENCOUNTER — Ambulatory Visit (HOSPITAL_COMMUNITY)
Admission: RE | Admit: 2023-05-12 | Discharge: 2023-05-12 | Disposition: A | Discharge status: Home / Self Care | Payer: Medicare Other | Source: Ambulatory Visit | Attending: Vascular Surgery | Admitting: Vascular Surgery

## 2023-05-12 ENCOUNTER — Encounter: Payer: Self-pay | Admitting: Vascular Surgery

## 2023-05-12 VITALS — BP 156/83 | HR 67 | Temp 97.8°F | Resp 20 | Ht 68.0 in | Wt 154.0 lb

## 2023-05-12 DIAGNOSIS — I739 Peripheral vascular disease, unspecified: Secondary | ICD-10-CM

## 2023-05-12 DIAGNOSIS — I6522 Occlusion and stenosis of left carotid artery: Secondary | ICD-10-CM | POA: Diagnosis not present

## 2023-05-12 DIAGNOSIS — Z95828 Presence of other vascular implants and grafts: Secondary | ICD-10-CM | POA: Insufficient documentation

## 2023-05-12 DIAGNOSIS — J069 Acute upper respiratory infection, unspecified: Secondary | ICD-10-CM

## 2023-05-12 LAB — VAS US ABI WITH/WO TBI

## 2023-05-31 ENCOUNTER — Other Ambulatory Visit: Payer: Self-pay

## 2023-05-31 DIAGNOSIS — I739 Peripheral vascular disease, unspecified: Secondary | ICD-10-CM

## 2023-05-31 DIAGNOSIS — Z95828 Presence of other vascular implants and grafts: Secondary | ICD-10-CM

## 2023-06-07 DIAGNOSIS — H01001 Unspecified blepharitis right upper eyelid: Secondary | ICD-10-CM | POA: Diagnosis not present

## 2023-06-07 DIAGNOSIS — H01002 Unspecified blepharitis right lower eyelid: Secondary | ICD-10-CM | POA: Diagnosis not present

## 2023-06-07 DIAGNOSIS — H401122 Primary open-angle glaucoma, left eye, moderate stage: Secondary | ICD-10-CM | POA: Diagnosis not present

## 2023-06-07 DIAGNOSIS — H18413 Arcus senilis, bilateral: Secondary | ICD-10-CM | POA: Diagnosis not present

## 2023-06-07 DIAGNOSIS — H11153 Pinguecula, bilateral: Secondary | ICD-10-CM | POA: Diagnosis not present

## 2023-06-07 DIAGNOSIS — H401111 Primary open-angle glaucoma, right eye, mild stage: Secondary | ICD-10-CM | POA: Diagnosis not present

## 2023-06-07 DIAGNOSIS — Z961 Presence of intraocular lens: Secondary | ICD-10-CM | POA: Diagnosis not present

## 2023-06-07 DIAGNOSIS — H01005 Unspecified blepharitis left lower eyelid: Secondary | ICD-10-CM | POA: Diagnosis not present

## 2023-06-07 DIAGNOSIS — H01004 Unspecified blepharitis left upper eyelid: Secondary | ICD-10-CM | POA: Diagnosis not present

## 2023-06-28 ENCOUNTER — Other Ambulatory Visit: Payer: Self-pay | Admitting: Cardiology

## 2023-07-19 ENCOUNTER — Encounter: Payer: Self-pay | Admitting: Family Medicine

## 2023-07-19 ENCOUNTER — Ambulatory Visit (INDEPENDENT_AMBULATORY_CARE_PROVIDER_SITE_OTHER): Payer: Medicare Other | Admitting: Family Medicine

## 2023-07-19 VITALS — BP 160/100 | HR 80 | Temp 98.0°F | Ht 68.0 in | Wt 159.0 lb

## 2023-07-19 DIAGNOSIS — Z0000 Encounter for general adult medical examination without abnormal findings: Secondary | ICD-10-CM | POA: Diagnosis not present

## 2023-07-19 DIAGNOSIS — E785 Hyperlipidemia, unspecified: Secondary | ICD-10-CM | POA: Diagnosis not present

## 2023-07-19 DIAGNOSIS — R7303 Prediabetes: Secondary | ICD-10-CM | POA: Diagnosis not present

## 2023-07-19 DIAGNOSIS — Z23 Encounter for immunization: Secondary | ICD-10-CM

## 2023-07-19 DIAGNOSIS — I10 Essential (primary) hypertension: Secondary | ICD-10-CM | POA: Diagnosis not present

## 2023-07-19 DIAGNOSIS — Z0001 Encounter for general adult medical examination with abnormal findings: Secondary | ICD-10-CM

## 2023-07-19 DIAGNOSIS — J449 Chronic obstructive pulmonary disease, unspecified: Secondary | ICD-10-CM

## 2023-07-19 NOTE — Assessment & Plan Note (Addendum)
 Fasting labs today, continue Rosuvastatin . I recommend consuming a heart healthy diet such as Mediterranean diet or DASH diet with whole grains, fruits, vegetable, fish, lean meats, nuts, and olive oil. Limit sweets and processed foods. I also encourage moderate intensity exercise 150 minutes weekly. This is 3-5 times weekly for 30-50 minutes each session. Goal should be pace of 3 miles/hours, or walking 1.5 miles in 30 minutes. Follow up in 6 months or sooner if needed.

## 2023-07-19 NOTE — Progress Notes (Signed)
 New Patient Office Visit  Subjective    Patient ID: Gregory Eaton, male    DOB: [birth year]  Age: 77 y.o. MRN: [id number]  CC:  Chief Complaint  Patient presents with   Establish Care    HPI AZION CENTRELLA presents to establish care. Oriented to practice routines and expectations. PMH as below. He does see a gastroenterologist (esophageal cancer, GERD), vascular surgery (PAC and carotid artery stenosis), cardiology (CAD, HTN, HLD), and pulmonology (COPD). Home BP readings are 130-145/70s and he checks this daily. Sees ophthalmology annually, dentist regularly, eats heart healthy diet.  Colon CA screening: colonoscopy 2 years ago without abnormalities. Tobacco: former smoker, quit 25 years ago Vaccines:  Shingles x2, PNA, covid x4, RSV this season, flu today  Vascular Surgery 05/12/2023 for reference only: Surgical history:  04/03/2021 left external iliac artery stenting 04/11/2021 - right iliofemoral endarterectomy with bilateral common iliac stents and right external iliac stent  05/11/2021 - left femoral to below the knee popliteal bypass with ringed PTFE 09/01/2022 - drug-coated balloon angioplasty and extension of right iliac stents Plan is to follow him in 65-month intervals  Cardiology 05/09/2023 for reference only: CAD  - 01/2021 nuclear stress was low risk, though very small area of ischemia - no recent chest pains, some SOB more related to COPD and current exacerbation - continue current meds  2. Hyperlipidemia -at goal, continue current meds  3. HTN - elevated here but home numbers at goal. Also has been no prednisone  last few days - careful bp mamagenet due to prior orthostatic symptoms - continue current regimen.   Pulmonology 04/11/2023 for reference only:  Moderately severe COPD (FEV1 59%): GOLD Class B, mMRC <2 --CONTINUE Anoro ONE puff ONCE a day. REFILL --CONTINUE Albuterol  TWO puffs every 6 hours as needed for shortness of breath or wheezing. REFILL --CONTINUE  montelukast  10 mg daily. REFILL --CONTINUE Albuterol  as needed for shortness of breath or wheezing   RLL lung nodule: Stable 8mm nodule, unchanged since 2015 No further imaging indicated  Gastroenterology 11/29/2022 for reference only: Patient has a history of esophageal carcinoma in 2010 status post esophagectomy with gastric pull-up, Barrett's esophagus diagnosed 2019 with no dysplasia, GERD, colonic adenomas.   Outpatient Encounter Medications as of 07/19/2023  Medication Sig   acetaminophen  (TYLENOL ) 500 MG tablet Take 1,000 mg by mouth daily as needed for headache.   albuterol  (PROVENTIL ) (2.5 MG/3ML) 0.083% nebulizer solution USE ONE VIAL (2.5MG  TOTAL) IN NEBULIZER EVERY SIX HOURS AS NEEDED FOR WHEEZING OR SHORTNESS OF BREATH   albuterol  (VENTOLIN  HFA) 108 (90 Base) MCG/ACT inhaler Inhale 2 puffs into the lungs every 6 (six) hours as needed for wheezing or shortness of breath.   ALPRAZolam  (XANAX ) 0.5 MG tablet Take 1 tablet (0.5 mg total) by mouth at bedtime.   amLODipine  (NORVASC ) 10 MG tablet TAKE ONE (1) TABLET BY MOUTH EVERY DAY   aspirin  EC 81 MG tablet Take 1 tablet (81 mg total) by mouth daily. Swallow whole.   baclofen  (LIORESAL ) 10 MG tablet Take 1 tablet (10 mg total) by mouth 3 (three) times daily as needed for muscle spasms.   clopidogrel  (PLAVIX ) 75 MG tablet TAKE ONE (1) TABLET BY MOUTH EVERY DAY   dorzolamide-timolol  (COSOPT) 2-0.5 % ophthalmic solution Place 1 drop into both eyes 2 (two) times daily.   gabapentin  (NEURONTIN ) 100 MG capsule Take 100 mg by mouth 3 (three) times daily as needed for pain.   lansoprazole  (PREVACID ) 30 MG capsule Take 1 capsule (30 mg  total) by mouth daily before supper.   losartan  (COZAAR ) 100 MG tablet TAKE ONE TABLET BY MOUTH EVERY NIGHT AT BEDTIME   metoprolol  succinate (TOPROL -XL) 50 MG 24 hr tablet TAKE ONE (1) TABLET BY MOUTH EVERY DAY   montelukast  (SINGULAIR ) 10 MG tablet Take 1 tablet (10 mg total) by mouth daily.   Multiple  Vitamins-Minerals (CENTRUM SILVER PO) Take 1 tablet by mouth daily.   nitroGLYCERIN  (NITROSTAT ) 0.4 MG SL tablet Place 1 tablet (0.4 mg total) under the tongue every 5 (five) minutes as needed for chest pain.   rosuvastatin  (CRESTOR ) 10 MG tablet Take 10 mg by mouth daily.   spironolactone  (ALDACTONE ) 25 MG tablet Take 0.5 tablets (12.5 mg total) by mouth daily.   sucralfate  (CARAFATE ) 1 g tablet Take 1 tablet (1 g total) by mouth 4 (four) times daily -  with meals and at bedtime. As needed for esophageal pain, heartburn. You can crush in couple of tablespoons of water  to make suspension.   umeclidinium-vilanterol (ANORO ELLIPTA ) 62.5-25 MCG/ACT AEPB Inhale 1 puff into the lungs daily.   No facility-administered encounter medications on file as of 07/19/2023.    Past Medical History:  Diagnosis Date   Adenomatous colon polyp 03/2009   Last colonoscopy by Dr. Shaaron    Adenomatous polyp 2010   Adenomatous polyp of colon 11/03/2010   Arthritis    Barrett's esophagus    CAD (coronary artery disease)    Cataract    Complication of anesthesia    has a shortened esophogus due to CA.    COPD (chronic obstructive pulmonary disease) (HCC)    Severe emphysema per CT   Diverticulosis    Emphysema of lung (HCC)    Esophageal carcinoma (HCC) 03/2009   T1N1M0   GERD (gastroesophageal reflux disease)    Glaucoma    Heart murmur    History of Doppler ultrasound 11/09/2011   03/2014- 50-69% L ICA stenosis;carotid doppler; L bulb/prox ICA 0-49% diameter reduction; L vertebral artery - occlusive ds; L ECA  demonstrates severe amount of fibrous plaque   History of Doppler ultrasound 11/09/2011   LEAs; R ABI - mod art. insuff.; L ABI normal at rest; R SFA - occlusive ds; L SFA - occlusive ds; patent fem-pop graft   History of echocardiogram 08/27/2009   EF >55%   History of hiatal hernia    History of kidney stones 1965   History of nuclear stress test 11/24/2011   lexiscan ; normal perfusion; low  risk scan; non-diagnostic for ischemia   Hyperlipidemia    Hypertension    Left carotid artery stenosis 04/08/2014   Pneumonia    Pre-diabetes    Pulmonary nodule, right 04/08/2014   2.8 mm-incidental finding on CT   PVD (peripheral vascular disease) (HCC)    Tachyarrhythmia 1999   Status post ablation at [facility]   Tobacco abuse     Past Surgical History:  Procedure Laterality Date   ABDOMINAL AORTOGRAM W/LOWER EXTREMITY N/A 04/01/2021   Procedure: ABDOMINAL AORTOGRAM W/LOWER EXTREMITY;  Surgeon: Lanis Fonda BRAVO, MD;  Location: [facility] INVASIVE CV LAB;  Service: Cardiovascular;  Laterality: N/A;   ABDOMINAL AORTOGRAM W/LOWER EXTREMITY N/A 09/01/2022   Procedure: ABDOMINAL AORTOGRAM W/LOWER EXTREMITY;  Surgeon: Lanis Fonda BRAVO, MD;  Location: [facility] INVASIVE CV LAB;  Service: Cardiovascular;  Laterality: N/A;   BIOPSY  10/24/2017   Procedure: BIOPSY;  Surgeon: Shaaron Lamar HERO, MD;  Location: [facility];  Service: Endoscopy;;  esophagus   BIOPSY  10/22/2021  Procedure: BIOPSY;  Surgeon: Shaaron Lamar HERO, MD;  Location: [facility];  Service: Endoscopy;;   CATARACT EXTRACTION Bilateral    COLONOSCOPY  03/17/2009   Dr.Rourk- normal rectum, sigmoid diverticula, some pale sigmoid mucosa with diffuse petechiae. pedunculated polyp at the splenic flexure, remainder of colonic mucosa appeared normal. bx= adenomatous polyp   COLONOSCOPY  04/19/2003   Dr.Rehman- few diverticu;a at the sigmoid colon, 3 small polyps, one at the transverse colon and 2 at the sigmoid, small external hemorrhoids. bx report not available.    COLONOSCOPY WITH PROPOFOL  N/A 10/24/2017   Dr. Shaaron: Diverticulosis, 11 mm polyp at the ileocecal valve, tubular adenoma.  Repeat colonoscopy in 3 years   COLONOSCOPY WITH PROPOFOL  N/A 10/22/2021   Procedure: COLONOSCOPY WITH PROPOFOL ;  Surgeon: Shaaron Lamar HERO, MD;  Location: [facility];  Service: Endoscopy;  Laterality: N/A;  11:00am   CORONARY ARTERY BYPASS GRAFT  1998   [name]    ENDARTERECTOMY FEMORAL Right 04/13/2021   Procedure: RIGHT ILIOFEMORAL ARTERY ENDARTERECTOMY WITH PATCH ANGIOPLASTY USING HEMASHIELD PALTINUM FINESSE DACRON PATCH;  Surgeon: Lanis Fonda BRAVO, MD;  Location: [facility] OR;  Service: Vascular;  Laterality: Right;   ESOPHAGECTOMY  2010   [facility] Dr. Garnet   ESOPHAGOGASTRODUODENOSCOPY  11/25/2010   Dr.Rourk- s/p esophagectomy with gastric pull-up, esophageal erosions straddling the surgical anastomosis, salmon colored epithelium coming up a good centimeter to a centimeter and a half above the suture line, islands of salmon colored epithelium in the most poximal residual esophagus, remainder of gastric mucosa appeared normal. bx= swamous &gastric glandular mucosa w/chronic active inflammation   ESOPHAGOGASTRODUODENOSCOPY  03/17/2009   Dr.Rourk- 4cm segment of salmon-colored epithlium distal esophagus suspicious for barretts esophagus. area of suspicious nodularity w/in this segment bx seperately. small to moderate size hiatal hernia, o/w normal stomach D1 and D2 bx=adenocarcinoma   ESOPHAGOGASTRODUODENOSCOPY (EGD) WITH PROPOFOL  N/A 10/24/2017   Dr. Shaaron: Remnant of esophagus with anastomosis with stomach at 24 cm from the incisors, 2 cm above the anastomosis he was found to have Barrett's but no dysplasia.  Advised for repeat EGD in 3 years   ESOPHAGOGASTRODUODENOSCOPY (EGD) WITH PROPOFOL  N/A 10/22/2021   Procedure: ESOPHAGOGASTRODUODENOSCOPY (EGD) WITH PROPOFOL ;  Surgeon: Shaaron Lamar HERO, MD;  Location[facility];  Service: Endoscopy;  Laterality: N/A;   EYE SURGERY     cataract   FEMORAL-POPLITEAL BYPASS GRAFT  1993   occlusive ds in R SFA   FEMORAL-POPLITEAL BYPASS GRAFT Left 05/11/2021   Procedure: LEFT FEMORAL-BELOW KNEE POPLITEAL ARTERY BYPASS GRAFT WITH 6 mm PTFE and COMPLETION ANGIOGRAM;  Surgeon: Lanis Fonda BRAVO, MD;  Location: [facility] OR;  Service: Vascular;  Laterality: Left;   GASTRIC PULL THROUGH  2010   With esophagectomy   INSERTION OF  ILIAC STENT Bilateral 04/13/2021   Procedure: INSERTION OF BILATERAL COMMON ILIAC KISSING STENTS AND INSERION OF RIGHT EXTERNAL ILIAC STENT;  Surgeon: Lanis Fonda BRAVO, MD;  Location: [facility] OR;  Service: Vascular;  Laterality: Bilateral;   JOINT REPLACEMENT     LIPOMA EXCISION  2010   LOWER EXTREMITY ANGIOGRAM Left 04/13/2021   Procedure: LEFT LOWER EXTREMITY ANGIOGRAM ;  Surgeon: Lanis Fonda BRAVO, MD;  Location: [facility] OR;  Service: Vascular;  Laterality: Left;   PERIPHERAL VASCULAR INTERVENTION  04/01/2021   Procedure: PERIPHERAL VASCULAR INTERVENTION;  Surgeon: Lanis Fonda BRAVO, MD;  Location: [facility] INVASIVE CV LAB;  Service: Cardiovascular;;   PERIPHERAL VASCULAR INTERVENTION Right 09/01/2022   Procedure: PERIPHERAL VASCULAR INTERVENTION;  Surgeon: Lanis Fonda BRAVO, MD;  Location: [facility] INVASIVE CV LAB;  Service: Cardiovascular;  Laterality: Right;  right external iliac   POLYPECTOMY  10/24/2017   Procedure: POLYPECTOMY;  Surgeon: Shaaron Lamar HERO, MD;  [facility];  Service: Endoscopy;;  colon    POLYPECTOMY  10/22/2021   Procedure: POLYPECTOMY;  Surgeon: Shaaron Lamar HERO, MD;  Location: [facility];  Service: Endoscopy;;   TOTAL HIP ARTHROPLASTY Left 01/30/2019   Procedure: TOTAL HIP ARTHROPLASTY ANTERIOR APPROACH;  Surgeon: Beverley Evalene BIRCH, MD;  Location: [facility] ORS;  Service: Orthopedics;  Laterality: Left;   ULTRASOUND GUIDANCE FOR VASCULAR ACCESS Left 04/13/2021   Procedure: ULTRASOUND GUIDANCE FOR VASCULAR ACCESS, left femoral;  Surgeon: Lanis Fonda BRAVO, MD;  Location: [facility] OR;  Service: Vascular;  Laterality: Left;    Family History  Problem Relation Age of Onset   GER disease Mother    Coronary artery disease Brother    Congenital heart disease Sister    Colon cancer Neg Hx     Social History   Socioeconomic History   Marital status: Married    Spouse name: Not on file   Number of children: 2   Years of education: Not on file   Highest education level: Not on file   Occupational History   Occupation: retired    Associate Professor: RETIRED    Comment: truck driver  Tobacco Use   Smoking status: Former    Current packs/day: 0.00    Average packs/day: 2.0 packs/day for 40.0 years (80.0 ttl pk-yrs)    Types: Cigarettes    Start date: 65    Quit date: 07/20/1995    Years since quitting: 28.0    Passive exposure: Never   Smokeless tobacco: Never  Vaping Use   Vaping status: Never Used  Substance and Sexual Activity   Alcohol use: No    Alcohol/week: 0.0 standard drinks of alcohol   Drug use: No   Sexual activity: Yes    Partners: Female    Birth control/protection: Condom    Comment: friend  Other Topics Concern   Not on file  Social History Narrative   Not on file   Social Drivers of Health   Financial Resource Strain: Low Risk  (12/02/2021)   Received from [facility], [facility]   Overall Financial Resource Strain (CARDIA)    Difficulty of Paying Living Expenses: Not hard at all  Food Insecurity: No Food Insecurity (12/02/2021)   Received from [facility], [facility]   Hunger Vital Sign    Worried About Running Out of Food in the Last Year: Never true    Ran Out of Food in the Last Year: Never true  Transportation Needs: No Transportation Needs (11/11/2020)   Received from [facility]   PRAPARE - Transportation    Lack of Transportation (Medical): No    Lack of Transportation (Non-Medical): No  Physical Activity: Inactive (12/02/2021)   Received from [facility], [facility]   Exercise Vital Sign    Days of Exercise per Week: 0 days    Minutes of Exercise per Session: 0 min  Stress: No Stress Concern Present (12/02/2021)   Received from [facility] - Occupational Stress Questionnaire    Feeling of Stress : Not at all  Social Connections: Unknown (12/04/2022)   Received from [facility], [facility]   Social Network    Social Network: Not on file   Intimate Partner Violence: Unknown (12/04/2022)   Received  from [facility], [facility]   HITS    Physically Hurt: Not on file    Insult or Talk Down To: Not on file    Threaten Physical Harm: Not on file    Scream or Curse: Not on file    Review of Systems  Constitutional: Negative.   HENT:  Positive for hearing loss.   Eyes:  Positive for redness.  Respiratory: Negative.    Cardiovascular: Negative.   Gastrointestinal: Negative.   Genitourinary: Negative.   Musculoskeletal: Negative.   Skin: Negative.   Neurological: Negative.   Endo/Heme/Allergies: Negative.   Psychiatric/Behavioral: Negative.    All other systems reviewed and are negative.       Objective    BP (!) 160/100 (BP Location: Left Arm)   Pulse 80   Temp 98 F (36.7 C)   Ht 5' 8 (1.727 m)   Wt 159 lb (72.1 kg)   SpO2 98%   BMI 24.18 kg/m   Physical Exam Vitals and nursing note reviewed.  Constitutional:      Appearance: Normal appearance. He is normal weight.  HENT:     Head: Normocephalic and atraumatic.     Right Ear: Tympanic membrane, ear canal and external ear normal.     Left Ear: Tympanic membrane, ear canal and external ear normal.     Nose: Nose normal.     Mouth/Throat:     Mouth: Mucous membranes are moist.     Pharynx: Oropharynx is clear.  Eyes:     Extraocular Movements: Extraocular movements intact.     Right eye: Normal extraocular motion and no nystagmus.     Left eye: Normal extraocular motion and no nystagmus.     Conjunctiva/sclera:     Right eye: Right conjunctiva is injected.     Left eye: Left conjunctiva is injected.     Pupils: Pupils are equal, round, and reactive to light.  Cardiovascular:     Rate and Rhythm: Normal rate and regular rhythm.     Pulses: Normal pulses.     Heart sounds: Normal heart sounds.  Pulmonary:     Effort: Pulmonary effort is normal.     Breath sounds: Normal breath sounds.  Abdominal:     General: Bowel sounds are normal.      Palpations: Abdomen is soft.  Genitourinary:    Comments: Deferred using shared decision making Musculoskeletal:        General: Normal range of motion.     Cervical back: Normal range of motion and neck supple.  Skin:    General: Skin is warm and dry.     Capillary Refill: Capillary refill takes less than 2 seconds.  Neurological:     General: No focal deficit present.     Mental Status: He is alert. Mental status is at baseline.  Psychiatric:        Mood and Affect: Mood normal.        Speech: Speech normal.        Behavior: Behavior normal.        Thought Content: Thought content normal.        Cognition and Memory: Cognition and memory normal.        Judgment: Judgment normal.         Assessment & Plan:   Problem List Items Addressed This Visit     Essential hypertension   Chronic. Elevated today in office 160/100, he reports he has had a cold and it has been elevated  but monitors at home daily and BP remains <130/90. Recommend heart healthy diet such as Mediterranean diet with whole grains, fruits, vegetable, fish, lean meats, nuts, and olive oil. Limit salt. Encouraged moderate walking, 3-5 times/week for 30-50 minutes each session as tolerated. Aim for at least 150 minutes.week. Goal should be pace of 3 miles/hours, or walking 1.5 miles in 30 minutes. Avoid tobacco products. Avoid excess alcohol. Take medications as prescribed and bring medications and blood pressure log with cuff to each office visit. Seek medical care for chest pain, palpitations, shortness of breath with exertion, dizziness/lightheadedness, vision changes, recurrent headaches, or swelling of extremities.       Hyperlipidemia LDL goal <70   Fasting labs today, continue Rosuvastatin . I recommend consuming a heart healthy diet such as Mediterranean diet or DASH diet with whole grains, fruits, vegetable, fish, lean meats, nuts, and olive oil. Limit sweets and processed foods. I also encourage moderate  intensity exercise 150 minutes weekly. This is 3-5 times weekly for 30-50 minutes each session. Goal should be pace of 3 miles/hours, or walking 1.5 miles in 30 minutes. Follow up in 6 months or sooner if needed.       COPD, group B, by GOLD 2017 classification (HCC)   Continue inhalers per Pulmonology.       Physical exam, annual - Primary   Today your medical history was reviewed and routine physical exam with labs was performed. Recommend 150 minutes of moderate intensity exercise weekly and consuming a well-balanced diet. Advised to stop smoking if a smoker, avoid smoking if a non-smoker, limit alcohol consumption to 1 drink per day for women and 2 drinks per day for men, and avoid illicit drug use. Counseled on safe sex practices and offered STI testing today. Counseled on the importance of sunscreen use. Counseled in mental health awareness and when to seek medical care. Vaccine maintenance discussed. Appropriate health maintenance items reviewed. Return to office in 1 year for annual physical exam.       Relevant Orders   CBC with Differential/Platelet   COMPLETE METABOLIC PANEL WITH GFR   Lipid panel   Other Visit Diagnoses       Prediabetes       Relevant Orders   Hemoglobin A1c   Microalbumin / creatinine urine ratio     Flu vaccine need       Relevant Orders   Flu Vaccine Trivalent High Dose (Fluad) (Completed)       Return in about 4 weeks (around 08/16/2023) for hypertension.   Jeoffrey GORMAN Barrio, FNP

## 2023-07-19 NOTE — Assessment & Plan Note (Signed)
 Chronic. Elevated today in office 160/100, he reports he has had a cold and it has been elevated but monitors at home daily and BP remains <130/90. Recommend heart healthy diet such as Mediterranean diet with whole grains, fruits, vegetable, fish, lean meats, nuts, and olive oil. Limit salt. Encouraged moderate walking, 3-5 times/week for 30-50 minutes each session as tolerated. Aim for at least 150 minutes.week. Goal should be pace of 3 miles/hours, or walking 1.5 miles in 30 minutes. Avoid tobacco products. Avoid excess alcohol. Take medications as prescribed and bring medications and blood pressure log with cuff to each office visit. Seek medical care for chest pain, palpitations, shortness of breath with exertion, dizziness/lightheadedness, vision changes, recurrent headaches, or swelling of extremities.

## 2023-07-19 NOTE — Assessment & Plan Note (Signed)
Continue inhalers per Pulmonology.

## 2023-07-19 NOTE — Assessment & Plan Note (Signed)

## 2023-07-20 LAB — COMPLETE METABOLIC PANEL WITH GFR
AG Ratio: 1.8 (calc) (ref 1.0–2.5)
ALT: 22 U/L (ref 9–46)
AST: 19 U/L (ref 10–35)
Albumin: 4.8 g/dL (ref 3.6–5.1)
Alkaline phosphatase (APISO): 76 U/L (ref 35–144)
BUN: 13 mg/dL (ref 7–25)
CO2: 24 mmol/L (ref 20–32)
Calcium: 10.1 mg/dL (ref 8.6–10.3)
Chloride: 105 mmol/L (ref 98–110)
Creat: 0.73 mg/dL (ref 0.70–1.28)
Globulin: 2.7 g/dL (ref 1.9–3.7)
Glucose, Bld: 101 mg/dL — ABNORMAL HIGH (ref 65–99)
Potassium: 4.5 mmol/L (ref 3.5–5.3)
Sodium: 142 mmol/L (ref 135–146)
Total Bilirubin: 0.5 mg/dL (ref 0.2–1.2)
Total Protein: 7.5 g/dL (ref 6.1–8.1)
eGFR: 93 mL/min/{1.73_m2} (ref >=60)

## 2023-07-20 LAB — HEMOGLOBIN A1C
Hgb A1c MFr Bld: 6.5 %{Hb} — ABNORMAL HIGH (ref <5.7)
Mean Plasma Glucose: 140 mg/dL
eAG (mmol/L): 7.7 mmol/L

## 2023-07-20 LAB — CBC WITH DIFFERENTIAL/PLATELET
Absolute Lymphocytes: 2107 {cells}/uL (ref 850–3900)
Absolute Monocytes: 868 {cells}/uL (ref 200–950)
Basophils Absolute: 77 {cells}/uL (ref 0–200)
Basophils Relative: 1.1 %
Eosinophils Absolute: 147 {cells}/uL (ref 15–500)
Eosinophils Relative: 2.1 %
HCT: 48 % (ref 38.5–50.0)
Hemoglobin: 15.4 g/dL (ref 13.2–17.1)
MCH: 29.3 pg (ref 27.0–33.0)
MCHC: 32.1 g/dL (ref 32.0–36.0)
MCV: 91.3 fL (ref 80.0–100.0)
MPV: 9.1 fL (ref 7.5–12.5)
Monocytes Relative: 12.4 %
Neutro Abs: 3801 {cells}/uL (ref 1500–7800)
Neutrophils Relative %: 54.3 %
Platelets: 374 10*3/uL (ref 140–400)
RBC: 5.26 10*6/uL (ref 4.20–5.80)
RDW: 12.2 % (ref 11.0–15.0)
Total Lymphocyte: 30.1 %
WBC: 7 10*3/uL (ref 3.8–10.8)

## 2023-07-20 LAB — MICROALBUMIN / CREATININE URINE RATIO
Creatinine, Urine: 54 mg/dL (ref 20–320)
Microalb Creat Ratio: 9 mg/g{creat} (ref <30)
Microalb, Ur: 0.5 mg/dL

## 2023-07-20 LAB — LIPID PANEL
Cholesterol: 130 mg/dL (ref <200)
HDL: 48 mg/dL (ref >=40)
LDL Cholesterol (Calc): 59 mg/dL
Non-HDL Cholesterol (Calc): 82 mg/dL (ref <130)
Total CHOL/HDL Ratio: 2.7 (calc) (ref <5.0)
Triglycerides: 142 mg/dL (ref <150)

## 2023-07-21 ENCOUNTER — Other Ambulatory Visit: Payer: Self-pay | Admitting: Family Medicine

## 2023-07-21 ENCOUNTER — Encounter: Payer: Self-pay | Admitting: Family Medicine

## 2023-07-21 MED ORDER — GABAPENTIN 100 MG PO CAPS: 100.0000 mg | ORAL_CAPSULE | Freq: Three times a day (TID) | ORAL | 1 refills | Status: DC | PRN

## 2023-07-21 MED ORDER — SUCRALFATE 1 G PO TABS: 1.0000 g | ORAL_TABLET | Freq: Three times a day (TID) | ORAL | 5 refills | Status: AC

## 2023-07-21 MED ORDER — LOSARTAN POTASSIUM 100 MG PO TABS: 100.0000 mg | ORAL_TABLET | Freq: Every day | ORAL | 3 refills | Status: AC

## 2023-07-21 MED ORDER — BACLOFEN 10 MG PO TABS: 10.0000 mg | ORAL_TABLET | Freq: Three times a day (TID) | ORAL | 0 refills | Status: AC | PRN

## 2023-07-21 MED ORDER — ROSUVASTATIN CALCIUM 10 MG PO TABS: 10.0000 mg | ORAL_TABLET | Freq: Every day | ORAL | 1 refills | Status: DC

## 2023-07-21 MED ORDER — METOPROLOL SUCCINATE ER 50 MG PO TB24: 50.0000 mg | ORAL_TABLET | Freq: Every day | ORAL | 1 refills | Status: DC

## 2023-07-21 MED ORDER — LANSOPRAZOLE 30 MG PO CPDR: 30.0000 mg | DELAYED_RELEASE_CAPSULE | Freq: Every day | ORAL | 3 refills | Status: AC

## 2023-09-05 ENCOUNTER — Other Ambulatory Visit: Payer: Self-pay | Admitting: Family Medicine

## 2023-09-05 NOTE — Telephone Encounter (Signed)
Copied from CRM [id number]. Topic: Clinical - Medication Refill >> Sep 05, 2023 11:42 AM Gery Pray wrote: Most Recent Primary Care Visit:  Provider: Kurtis Bushman S  Department: [facility]  Visit Type: NEW PATIENT 30  Date: 07/19/2023  Medication: ALPRAZolam Prudy Feeler) 0.5 MG table  Has the patient contacted their pharmacy? Yes (Agent: If no, request that the patient contact the pharmacy for the refill. If patient does not wish to contact the pharmacy document the reason why and proceed with request.) (Agent: If yes, when and what did the pharmacy advise?)  Is this the correct pharmacy for this prescription? Yes If no, delete pharmacy and type the correct one.  This is the patient's preferred pharmacy:  [facility] - [place], [place] - [address] [place] [address] Phone: [phone] Fax: [phone]   Has the prescription been filled recently? No  Is the patient out of the medication? Yes  Has the patient been seen for an appointment in the last year OR does the patient have an upcoming appointment? Yes  Can we respond through MyChart? Yes  Agent: Please be advised that Rx refills may take up to 3 business days. We ask that you follow-up with your pharmacy.

## 2023-09-09 ENCOUNTER — Other Ambulatory Visit: Payer: Self-pay | Admitting: Family Medicine

## 2023-09-12 ENCOUNTER — Emergency Department (HOSPITAL_COMMUNITY): Payer: Medicare Other

## 2023-09-12 ENCOUNTER — Encounter (HOSPITAL_COMMUNITY): Payer: Self-pay | Admitting: Emergency Medicine

## 2023-09-12 ENCOUNTER — Other Ambulatory Visit: Payer: Self-pay

## 2023-09-12 ENCOUNTER — Emergency Department (HOSPITAL_COMMUNITY)
Admission: EM | Admit: 2023-09-12 | Discharge: 2023-09-12 | Disposition: A | Discharge status: Home / Self Care | Payer: Medicare Other

## 2023-09-12 DIAGNOSIS — C159 Malignant neoplasm of esophagus, unspecified: Secondary | ICD-10-CM | POA: Diagnosis not present

## 2023-09-12 DIAGNOSIS — R059 Cough, unspecified: Secondary | ICD-10-CM | POA: Diagnosis not present

## 2023-09-12 DIAGNOSIS — R519 Headache, unspecified: Secondary | ICD-10-CM | POA: Insufficient documentation

## 2023-09-12 DIAGNOSIS — Z7902 Long term (current) use of antithrombotics/antiplatelets: Secondary | ICD-10-CM | POA: Insufficient documentation

## 2023-09-12 DIAGNOSIS — R079 Chest pain, unspecified: Secondary | ICD-10-CM | POA: Diagnosis not present

## 2023-09-12 DIAGNOSIS — I10 Essential (primary) hypertension: Secondary | ICD-10-CM | POA: Diagnosis not present

## 2023-09-12 DIAGNOSIS — R0789 Other chest pain: Secondary | ICD-10-CM | POA: Insufficient documentation

## 2023-09-12 DIAGNOSIS — J449 Chronic obstructive pulmonary disease, unspecified: Secondary | ICD-10-CM | POA: Insufficient documentation

## 2023-09-12 DIAGNOSIS — Z79899 Other long term (current) drug therapy: Secondary | ICD-10-CM | POA: Diagnosis not present

## 2023-09-12 DIAGNOSIS — R9082 White matter disease, unspecified: Secondary | ICD-10-CM | POA: Diagnosis not present

## 2023-09-12 DIAGNOSIS — R911 Solitary pulmonary nodule: Secondary | ICD-10-CM | POA: Diagnosis not present

## 2023-09-12 DIAGNOSIS — I6523 Occlusion and stenosis of bilateral carotid arteries: Secondary | ICD-10-CM | POA: Diagnosis not present

## 2023-09-12 DIAGNOSIS — J439 Emphysema, unspecified: Secondary | ICD-10-CM | POA: Diagnosis not present

## 2023-09-12 LAB — TROPONIN I (HIGH SENSITIVITY)
Troponin I (High Sensitivity): 4 ng/L (ref <18)
Troponin I (High Sensitivity): 4 ng/L (ref <18)

## 2023-09-12 LAB — CBC
HCT: 49 % (ref 39.0–52.0)
Hemoglobin: 16.1 g/dL (ref 13.0–17.0)
MCH: 29.4 pg (ref 26.0–34.0)
MCHC: 32.9 g/dL (ref 30.0–36.0)
MCV: 89.4 fL (ref 80.0–100.0)
Platelets: 334 10*3/uL (ref 150–400)
RBC: 5.48 MIL/uL (ref 4.22–5.81)
RDW: 13.2 % (ref 11.5–15.5)
WBC: 7.7 10*3/uL (ref 4.0–10.5)
nRBC: 0 % (ref 0.0–0.2)

## 2023-09-12 LAB — BASIC METABOLIC PANEL
Anion gap: 12 (ref 5–15)
BUN: 12 mg/dL (ref 8–23)
CO2: 21 mmol/L — ABNORMAL LOW (ref 22–32)
Calcium: 9.7 mg/dL (ref 8.9–10.3)
Chloride: 105 mmol/L (ref 98–111)
Creatinine, Ser: 0.82 mg/dL (ref 0.61–1.24)
GFR, Estimated: >60 mL/min (ref >60)
Glucose, Bld: 98 mg/dL (ref 70–99)
Potassium: 4.2 mmol/L (ref 3.5–5.1)
Sodium: 138 mmol/L (ref 135–145)

## 2023-09-12 LAB — PROTIME-INR
INR: 1 (ref 0.8–1.2)
Prothrombin Time: 13.2 s (ref 11.4–15.2)

## 2023-09-12 MED ORDER — ALBUTEROL SULFATE HFA 108 (90 BASE) MCG/ACT IN AERS
2.0000 | INHALATION_SPRAY | RESPIRATORY_TRACT | 0 refills | Status: AC | PRN
Start: 2023-09-12 — End: ?

## 2023-09-12 MED ORDER — IOHEXOL 350 MG/ML SOLN
75.0000 mL | Freq: Once | INTRAVENOUS | Status: AC | PRN
  Administered 2023-09-12: 75 mL via INTRAVENOUS

## 2023-09-12 MED ORDER — KETOROLAC TROMETHAMINE 15 MG/ML IJ SOLN
15.0000 mg | Freq: Once | INTRAMUSCULAR | Status: AC
  Administered 2023-09-12: 15 mg via INTRAVENOUS
  Filled 2023-09-12: qty 1

## 2023-09-12 MED ORDER — PREDNISONE 50 MG PO TABS: ORAL_TABLET | ORAL | 0 refills | Status: DC

## 2023-09-12 MED ORDER — IPRATROPIUM-ALBUTEROL 0.5-2.5 (3) MG/3ML IN SOLN
3.0000 mL | Freq: Once | RESPIRATORY_TRACT | Status: AC
  Administered 2023-09-12: 3 mL via RESPIRATORY_TRACT
  Filled 2023-09-12: qty 3

## 2023-09-12 NOTE — ED Provider Notes (Addendum)
  Physical Exam  BP 129/63 (BP Location: Right Arm)   Pulse 89   Temp 97.7 F (36.5 C) (Oral)   Resp 13   Ht 5\' 8"  (1.727 m)   Wt 69.9 kg   SpO2 97%   BMI 23.42 kg/m   Physical Exam  Procedures  Procedures  ED Course / MDM    Medical Decision Making Amount and/or Complexity of Data Reviewed Labs: ordered. Radiology: ordered.  Risk Prescription drug management.   Assuming care of patient from Dr. Caralee Ates. PT comes in with cc of chest tightness.  CT=PE ordered.  Lung exam is reassuring. Nebs didn't help. If CT neg, can d/c with copd meds and cards f/u.    Patient had no complains, no concerns from the nursing side. Will continue to monitor.    Derwood Kaplan, MD 09/12/23 1652  The patient appears reasonably screened and/or stabilized for discharge and I doubt any other medical condition or other [facility] requiring further screening, evaluation, or treatment in the ED at this time prior to discharge.   Results from the ER workup discussed with the patient face to face and all questions answered to the best of my ability. The patient is safe for discharge with strict return precautions.     Derwood Kaplan, MD 09/12/23 [phone]

## 2023-09-12 NOTE — ED Provider Notes (Signed)
 [facility] AT [facility] Provider Note   CSN: [id number] Arrival date & time: 09/12/23  1213     History  Chief Complaint  Patient presents with   Chest Pain   Shortness of Breath    Gregory Eaton is a 78 y.o. male.  78 year old male with past medical history of COPD, HTN, and HLD presenting to the ER today with chest tightness.  This has been going on for the patient reports minimal cough with this.  He denies any fevers.  He denies any leg pain or swelling.  Denies any orthopnea.  He states that he came in today primarily for this as well as a headache he has had now for the past 2 weeks.  He states that he has had an occipital headache that is nonradiating.  Denies any focal weakness, numbness, or tingling with this.  He was thinking that this may be due to his COPD which she does have at baseline but wanted to come in primarily to have his heart checked out.  The patient's chest discomfort is nonexertional.  He denies a history of DVT or pulmonary embolism, recent surgeries, recent travel.   Chest Pain Associated symptoms: shortness of breath   Shortness of Breath Associated symptoms: chest pain        Home Medications Prior to Admission medications   Medication Sig Start Date End Date Taking? Authorizing Provider  albuterol (VENTOLIN HFA) 108 (90 Base) MCG/ACT inhaler Inhale 2 puffs into the lungs every 4 (four) hours as needed for wheezing or shortness of breath. 09/12/23  Yes Durwin Glaze, MD  predniSONE (DELTASONE) 50 MG tablet Take 1 tablet by mouth daily 09/12/23  Yes Durwin Glaze, MD  acetaminophen (TYLENOL) 500 MG tablet Take 1,000 mg by mouth daily as needed for headache.    [provider]  albuterol (PROVENTIL) (2.5 MG/3ML) 0.083% nebulizer solution USE ONE VIAL (2.5MG  TOTAL) IN NEBULIZER EVERY SIX HOURS AS NEEDED FOR WHEEZING OR SHORTNESS OF BREATH 02/10/23   Luciano Cutter, MD  albuterol (VENTOLIN HFA) 108 (90 Base)  MCG/ACT inhaler Inhale 2 puffs into the lungs every 6 (six) hours as needed for wheezing or shortness of breath. 04/11/23   Luciano Cutter, MD  ALPRAZolam Prudy Feeler) 0.5 MG tablet Take 1 tablet (0.5 mg total) by mouth at bedtime. 10/07/14   Lennette Bihari, MD  amLODipine (NORVASC) 10 MG tablet TAKE ONE (1) TABLET BY MOUTH EVERY DAY 06/28/23   Antoine Poche, MD  baclofen (LIORESAL) 10 MG tablet Take 1 tablet (10 mg total) by mouth 3 (three) times daily as needed for muscle spasms. 07/21/23   Park Meo, FNP  clopidogrel (PLAVIX) 75 MG tablet TAKE ONE (1) TABLET BY MOUTH EVERY DAY 06/28/23   Antoine Poche, MD  dorzolamide-timolol (COSOPT) 2-0.5 % ophthalmic solution Place 1 drop into both eyes 2 (two) times daily. 09/16/22   [provider]  gabapentin (NEURONTIN) 100 MG capsule Take 1 capsule (100 mg total) by mouth 3 (three) times daily as needed. 07/21/23   Park Meo, FNP  lansoprazole (PREVACID) 30 MG capsule Take 1 capsule (30 mg total) by mouth daily before supper. 07/21/23   Park Meo, FNP  losartan (COZAAR) 100 MG tablet Take 1 tablet (100 mg total) by mouth at bedtime. 07/21/23   Park Meo, FNP  metoprolol succinate (TOPROL-XL) 50 MG 24 hr tablet Take 1 tablet (50 mg total) by mouth daily. Take with  or immediately following a meal. 07/21/23   Dimas Aguas, Binnie Rail, FNP  montelukast (SINGULAIR) 10 MG tablet Take 1 tablet (10 mg total) by mouth daily. 04/11/23   Luciano Cutter, MD  Multiple Vitamins-Minerals (CENTRUM SILVER PO) Take 1 tablet by mouth daily.    [provider]  nitroGLYCERIN (NITROSTAT) 0.4 MG SL tablet Place 1 tablet (0.4 mg total) under the tongue every 5 (five) minutes as needed for chest pain. 02/10/21   Lennette Bihari, MD  rosuvastatin (CRESTOR) 10 MG tablet Take 1 tablet (10 mg total) by mouth daily. 07/21/23   Park Meo, FNP  spironolactone (ALDACTONE) 25 MG tablet Take 0.5 tablets (12.5 mg total) by mouth daily. 11/03/22 10/29/23   Antoine Poche, MD  sucralfate (CARAFATE) 1 g tablet Take 1 tablet (1 g total) by mouth 4 (four) times daily -  with meals and at bedtime. As needed for esophageal pain, heartburn. You can crush in couple of tablespoons of water to make suspension. 07/21/23   Park Meo, FNP  umeclidinium-vilanterol (ANORO ELLIPTA) 62.5-25 MCG/ACT AEPB Inhale 1 puff into the lungs daily. 04/11/23   Luciano Cutter, MD      Allergies    Altace [ramipril]    Review of Systems   Review of Systems  Respiratory:  Positive for shortness of breath.   Cardiovascular:  Positive for chest pain.  All other systems reviewed and are negative.   Physical Exam Updated Vital Signs BP 129/63 (BP Location: Right Arm)   Pulse 89   Temp 97.7 F (36.5 C) (Oral)   Resp 13   Ht 5\' 8"  (1.727 m)   Wt 69.9 kg   SpO2 97%   BMI 23.42 kg/m  Physical Exam Vitals and nursing note reviewed.   Gen: NAD Eyes: PERRL, EOMI HEENT: no oropharyngeal swelling Neck: trachea midline Resp: Diminished throughout all lung fields Card: RRR, no murmurs, rubs, or gallops Abd: nontender, nondistended Extremities: no calf tenderness, no edema Vascular: 2+ radial pulses bilaterally, 2+ DP pulses bilaterally Skin: no rashes Psyc: acting appropriately   ED Results / Procedures / Treatments   Labs (all labs ordered are listed, but only abnormal results are displayed) Labs Reviewed  BASIC METABOLIC PANEL - Abnormal; Notable for the following components:      Result Value   CO2 21 (*)    All other components within normal limits  CBC  PROTIME-INR  TROPONIN I (HIGH SENSITIVITY)  TROPONIN I (HIGH SENSITIVITY)    EKG EKG Interpretation Date/Time:  Monday September 12 2023 12:18:45 EST Ventricular Rate:  104 PR Interval:  196 QRS Duration:  132 QT Interval:  352 QTC Calculation: 462 R Axis:   98  Text Interpretation: Sinus tachycardia Right bundle branch block Possible Inferior infarct , age undetermined Abnormal ECG  When compared with ECG of 09-May-2023 08:18, No significant change was found Confirmed by Beckey Downing [id number]) on 09/12/2023 12:50:32 PM  Radiology CT Head Wo Contrast Result Date: 09/12/2023 CLINICAL DATA:  Headache, increasing frequency or severity. EXAM: CT HEAD WITHOUT CONTRAST TECHNIQUE: Contiguous axial images were obtained from the base of the skull through the vertex without intravenous contrast. RADIATION DOSE REDUCTION: This exam was performed according to the departmental dose-optimization program which includes automated exposure control, adjustment of the mA and/or kV according to patient size and/or use of iterative reconstruction technique. COMPARISON:  CT head without contrast 09/27/2022 FINDINGS: Brain: Periventricular and scattered subcortical white matter hypoattenuation is similar prior exam. No acute infarct, hemorrhage,  or mass lesion is present. The ventricles are of normal size. No significant extraaxial fluid collection is present. Deep brain nuclei are within normal limits. The ventricles are of normal size. The brainstem and cerebellum are within normal limits. Vascular: Atherosclerotic calcifications are present within the cavernous internal carotid arteries and at the dural margin both vertebral arteries. No hyperdense vessel is present. Skull: Calvarium is intact. No focal lytic or blastic lesions are present. No significant extracranial soft tissue lesion is present. Sinuses/Orbits: The paranasal sinuses and mastoid air cells are clear. Bilateral lens replacements are noted. Globes and orbits are otherwise unremarkable. IMPRESSION: 1. No acute intracranial abnormality or significant interval change. 2. Stable periventricular and scattered subcortical white matter hypoattenuation. This likely reflects the sequela of chronic microvascular ischemia. Electronically Signed   By: Marin Roberts M.D.   On: 09/12/2023 15:23   DG Chest 2 View Result Date: 09/12/2023 CLINICAL DATA:   Chest pain.  History of COPD.  Esophageal carcinoma. EXAM: CHEST - 2 VIEW COMPARISON:  Chest radiograph dated 07/22/2022. FINDINGS: No focal consolidation, pleural effusion, or pneumothorax. Left lung base atelectasis/scarring. Stable cardiac silhouette. Median sternotomy wires and CABG vascular clips. No acute osseous pathology. IMPRESSION: No active cardiopulmonary disease. Electronically Signed   By: Elgie Collard M.D.   On: 09/12/2023 14:50    Procedures Procedures    Medications Ordered in ED Medications  ipratropium-albuterol (DUONEB) 0.5-2.5 (3) MG/3ML nebulizer solution 3 mL (3 mLs Nebulization Given 09/12/23 1415)  ketorolac (TORADOL) 15 MG/ML injection 15 mg (15 mg Intravenous Given 09/12/23 1542)    ED Course/ Medical Decision Making/ A&P                                 Medical Decision Making 78 year old male with past medical history of COPD, hypertension, and hyperlipidemia presenting to the emergency department today with chest tightness.  I will further evaluate the patient here with basic labs Wels and EKG, chest x-ray, and troponin for further evaluation for ACS, pulmonary edema, pulmonary infiltrates, or pneumothorax.  Based on description of his symptoms and duration as well as his reassuring vital signs on my assessment suspicion for pulmonary embolism is low at this time.  Also doubt aortic dissection.  I will give the patient a DuoNeb here.  Given his headache I will obtain a CT scan of his head to eval for intracranial hemorrhage or mass lesion.  Suspicion for subarachnoid hemorrhage is low based on description of his symptoms.  If his CT scan is negative I will give the patient Toradol and reevaluate for ultimate disposition.  The patient's EKG interpreted by me shows a sinus rhythm with right bundle branch block and nonspecific ST-T changes.  The patient had 2 troponins that are negative.  Chest x-ray is clear.  He is still symptomatic and is now complaining of some  pleuritic CP.  I will obtain a CTA to evaluate for PE.  This is pending at the time of signout.  Amount and/or Complexity of Data Reviewed Labs: ordered. Radiology: ordered.  Risk Prescription drug management.          Final Clinical Impression(s) / ED Diagnoses Final diagnoses:  Chest tightness    Rx / DC Orders ED Discharge Orders          Ordered    Ambulatory referral to Cardiology       Comments: If you have not heard from the Cardiology office  within the next 72 hours please call [phone].   09/12/23 1548    predniSONE (DELTASONE) 50 MG tablet        09/12/23 1551    albuterol (VENTOLIN HFA) 108 (90 Base) MCG/ACT inhaler  Every 4 hours PRN        09/12/23 1551              Durwin Glaze, MD 09/12/23 1551

## 2023-09-12 NOTE — ED Triage Notes (Signed)
 Pt reports to the ED with Cp and SOB starting around 3am this morning. He went back to sleep and was woken again at 5am. He describes the pain as pressure 7/10. Pt reports sob and lightheadedness.

## 2023-09-12 NOTE — Discharge Instructions (Signed)
 Your workup today was reassuring.  Please take the steroids and use the albuterol every 4 hours over the next few days.  I have placed a consult to cardiology so they should call you in the next few days for follow up.  Return to the ER for worsening symptoms.

## 2023-09-13 ENCOUNTER — Telehealth: Payer: Self-pay

## 2023-09-13 NOTE — Telephone Encounter (Signed)
 Pt came in to ask for a refill of this med:  ALPRAZolam Prudy Feeler) 0.5 MG tablet [[id number]]  LOV: 07/19/23  PHARMACY: [facility] - [place], [place] - [address], [place] [address] Phone: [phone]  Fax: [phone]   CB: [phone]

## 2023-09-13 NOTE — Telephone Encounter (Signed)
 Pls see med

## 2023-09-13 NOTE — Telephone Encounter (Signed)
 Called and spoke w/pt, schedule appt for Mon.,09/19/2023 for med refill.

## 2023-09-14 ENCOUNTER — Other Ambulatory Visit: Payer: Self-pay | Admitting: Family Medicine

## 2023-09-19 ENCOUNTER — Encounter: Payer: Self-pay | Admitting: Family Medicine

## 2023-09-19 ENCOUNTER — Encounter: Payer: Self-pay | Admitting: Internal Medicine

## 2023-09-19 ENCOUNTER — Ambulatory Visit (INDEPENDENT_AMBULATORY_CARE_PROVIDER_SITE_OTHER): Payer: Medicare Other | Admitting: Family Medicine

## 2023-09-19 ENCOUNTER — Other Ambulatory Visit: Payer: Self-pay | Admitting: Family Medicine

## 2023-09-19 ENCOUNTER — Ambulatory Visit: Admitting: Internal Medicine

## 2023-09-19 VITALS — BP 178/78 | HR 84 | Temp 97.6°F | Ht 68.0 in | Wt 158.0 lb

## 2023-09-19 VITALS — BP 132/52 | HR 99 | Ht 68.0 in | Wt 158.0 lb

## 2023-09-19 DIAGNOSIS — F5104 Psychophysiologic insomnia: Secondary | ICD-10-CM | POA: Diagnosis not present

## 2023-09-19 DIAGNOSIS — I10 Essential (primary) hypertension: Secondary | ICD-10-CM | POA: Diagnosis not present

## 2023-09-19 DIAGNOSIS — J449 Chronic obstructive pulmonary disease, unspecified: Secondary | ICD-10-CM | POA: Diagnosis not present

## 2023-09-19 DIAGNOSIS — R0609 Other forms of dyspnea: Secondary | ICD-10-CM | POA: Diagnosis not present

## 2023-09-19 MED ORDER — NYSTATIN 100000 UNIT/ML MT SUSP: 5.0000 mL | Freq: Three times a day (TID) | OROMUCOSAL | 0 refills | Status: DC | PRN

## 2023-09-19 MED ORDER — TRAZODONE HCL 50 MG PO TABS: 25.0000 mg | ORAL_TABLET | Freq: Every evening | ORAL | 3 refills | Status: DC | PRN

## 2023-09-19 NOTE — Assessment & Plan Note (Addendum)
 Home readings have been WNL, will monitor over next week and return to office with readings. Reports dry mouth at night and white tongue with Losartan, he will switch this to taking in AM. Advised to discontinue immediately and notify office if swelling occurs. No s/s of thrush, requests magic mouthwash, rx sent. Take medications as prescribed and bring medications and blood pressure log with cuff to each office visit. Seek medical care for chest pain, palpitations, shortness of breath with exertion, dizziness/lightheadedness, vision changes, recurrent headaches, or swelling of extremities.

## 2023-09-19 NOTE — Assessment & Plan Note (Signed)
 BMP, CBC, CXR, EKG, CT angio chest, and CT head negative. BNP drawn today, will proceed with ECHO if indicated, he does see cardiology (next month) and pulmonology (today). BP elevated today in office. He reports normal reading this AM, will recheck in 1 week and continue to montior at home.

## 2023-09-19 NOTE — Progress Notes (Unsigned)
 Gregory Eaton, male    DOB: 21-Apr-1946, 78 y.o.   MRN: [id number]   Brief patient profile:  77 yowm quit smoking 1997 with GOLD II criteria 03/2015 referred to pulmonary clinic 12/19/2018 by Dr   Gregory Eaton for L THR. Clearance     History of Present Illness  12/19/2018  Pulmonary consultation office eval/Gregory Eaton  Dyspnea:  Limited by hip p several blocks Cough: min in am/ mucoid / "just to clear throat" ever since es ca surgery 2010 s RT/ chemo Sleep: under 8 in blocks due to regurg SABA use: not using  ?   throat clearing some worse since started anoro  Rec Stop anoro to see if throat irritation is any better Start bevespi Take 2 puffs first thing in am and then another 2 puffs about 12 hours later - call if don't like it for trial of stiolto or spiriva or resume anoro      09/19/2023  ACUTE ov/Gregory Eaton re: worse since waking up with epigastric pain same x years    maint on "prn anoro "/  to ER  09/12/23 neg cards w/u, neg CTa chest  rx as aecopd : prednisone 50 finished on 09/17/23   Chief Complaint  Patient presents with   Shortness of Breath    X10 days; comes and goes   Dyspnea:  since the onset of the pain, did work out in yard hauling branches 2 days p onset of  cp c  baseline doe but no cp  Cough: none  Sleeping: 8-10 inches  HOB  sev times a week has epigastric pain= same pain but this time rad to neck and assoc  with breathing difficulty  SABA use: albuterol helps some  02: none    No obvious day to day or daytime variability or assoc excess/ purulent sputum or mucus plugs or hemoptysis or c chest tightness, subjective wheeze or overt sinus or hb symptoms.    Also denies any obvious fluctuation of symptoms with weather or environmental changes or other aggravating or alleviating factors except as outlined above   No unusual exposure hx or h/o childhood pna/ asthma or knowledge of premature birth.  Current Allergies, Complete Past Medical History, Past Surgical History, Family  History, and Social History were reviewed in [employer] record.  ROS  The following are not active complaints unless bolded Hoarseness, sore throat, dysphagia, dental problems, itching, sneezing,  nasal congestion or discharge of excess mucus or purulent secretions, ear ache,   fever, chills, sweats, unintended wt loss or wt gain, classically pleuritic or exertional cp,  orthopnea pnd or arm/hand swelling  or leg swelling, presyncope, palpitations, abdominal pain, anorexia, nausea, vomiting, diarrhea  or change in bowel habits or change in bladder habits, change in stools or change in urine, dysuria, hematuria,  rash, arthralgias, visual complaints, headache, numbness, weakness or ataxia or problems with walking or coordination,  change in mood or  memory.        Current Meds  Medication Sig   acetaminophen (TYLENOL) 500 MG tablet Take 1,000 mg by mouth daily as needed for headache.   albuterol (PROVENTIL) (2.5 MG/3ML) 0.083% nebulizer solution USE ONE VIAL (2.5MG  TOTAL) IN NEBULIZER EVERY SIX HOURS AS NEEDED FOR WHEEZING OR SHORTNESS OF BREATH   albuterol (VENTOLIN HFA) 108 (90 Base) MCG/ACT inhaler Inhale 2 puffs into the lungs every 4 (four) hours as needed for wheezing or shortness of breath.   ALPRAZolam (XANAX) 0.5 MG tablet Take 1 tablet (  0.5 mg total) by mouth at bedtime.   amLODipine (NORVASC) 10 MG tablet TAKE ONE (1) TABLET BY MOUTH EVERY DAY   baclofen (LIORESAL) 10 MG tablet Take 1 tablet (10 mg total) by mouth 3 (three) times daily as needed for muscle spasms.   brimonidine (ALPHAGAN) 0.2 % ophthalmic solution SMARTSIG:In Eye(s)   clopidogrel (PLAVIX) 75 MG tablet TAKE ONE (1) TABLET BY MOUTH EVERY DAY   dorzolamide-timolol (COSOPT) 2-0.5 % ophthalmic solution Place 1 drop into both eyes 2 (two) times daily.   gabapentin (NEURONTIN) 100 MG capsule Take 1 capsule (100 mg total) by mouth 3 (three) times daily as needed.   lansoprazole (PREVACID) 30 MG capsule  Take 1 capsule (30 mg total) by mouth daily before supper.   latanoprost (XALATAN) 0.005 % ophthalmic solution SMARTSIG:In Eye(s)   losartan (COZAAR) 100 MG tablet Take 1 tablet (100 mg total) by mouth at bedtime.   magic mouthwash (nystatin, diphenhydrAMINE, alum & mag hydroxide) suspension mixture Swish and spit 5 mLs 3 (three) times daily as needed for mouth pain.   metoprolol succinate (TOPROL-XL) 50 MG 24 hr tablet Take 1 tablet (50 mg total) by mouth daily. Take with or immediately following a meal.   montelukast (SINGULAIR) 10 MG tablet Take 1 tablet (10 mg total) by mouth daily.   Multiple Vitamins-Minerals (CENTRUM SILVER PO) Take 1 tablet by mouth daily.   nitroGLYCERIN (NITROSTAT) 0.4 MG SL tablet Place 1 tablet (0.4 mg total) under the tongue every 5 (five) minutes as needed for chest pain.   predniSONE (DELTASONE) 50 MG tablet Take 1 tablet by mouth daily   rosuvastatin (CRESTOR) 10 MG tablet Take 1 tablet (10 mg total) by mouth daily.   spironolactone (ALDACTONE) 25 MG tablet Take 0.5 tablets (12.5 mg total) by mouth daily.   sucralfate (CARAFATE) 1 g tablet Take 1 tablet (1 g total) by mouth 4 (four) times daily -  with meals and at bedtime. As needed for esophageal pain, heartburn. You can crush in couple of tablespoons of water to make suspension.   traZODone (DESYREL) 50 MG tablet Take 0.5-1 tablets (25-50 mg total) by mouth at bedtime as needed for sleep.   umeclidinium-vilanterol (ANORO ELLIPTA) 62.5-25 MCG/ACT AEPB Inhale 1 puff into the lungs daily.                   Past Medical History:  Diagnosis Date   Adenomatous colon polyp 03/2009   Last colonoscopy by Dr. Jena Gauss    Adenomatous polyp 2010   Adenomatous polyp of colon 11/03/2010   Arthritis    Barrett's esophagus    CAD (coronary artery disease)    COPD (chronic obstructive pulmonary disease) (HCC)    Severe emphysema per CT   Diverticulosis    DM type 2 (diabetes mellitus, type 2) (HCC) 04/09/2014   A1c 6.5  (04/08/14)    Esophageal carcinoma (HCC) 03/2009   T1N1M0   GERD (gastroesophageal reflux disease)    Glaucoma    History of Doppler ultrasound 11/09/2011   03/2014- 50-69% L ICA stenosis;carotid doppler; L bulb/prox ICA 0-49% diameter reduction; L vertebral artery - occlusive ds; L ECA  demonstrates severe amount of fibrous plaque   History of Doppler ultrasound 11/09/11   LEAs; R ABI - mod art. insuff.; L ABI normal at rest; R SFA - occlusive ds; L SFA - occlusive ds; patent fem-pop graft   History of echocardiogram 08/27/2009   EF >55%   History of kidney stones  History of nuclear stress test 11/24/2011   lexiscan; normal perfusion; low risk scan; non-diagnostic for ischemia   Hyperlipidemia    Hypertension    Left carotid artery stenosis 04/08/2014   Pulmonary nodule, right 04/08/2014   2.8 mm-incidental finding on CT   PVD (peripheral vascular disease) (HCC)    Tachyarrhythmia 1999   Status post ablation at [facility]   Tobacco abuse      Objective:    Wt Readings from Last 3 Encounters:  09/19/23 158 lb (71.7 kg)  09/19/23 158 lb (71.7 kg)  09/12/23 154 lb (69.9 kg)     Vital signs reviewed  09/19/2023  - Note at rest 02 sats  93% on RA   General appearance:    amb anxious wm nad  classic pseudowheeze better with PLB    HEENT : Oropharynx  clear   Nasal turbinates nl    NECK :  without  apparent JVD/ palpable Nodes/TM    LUNGS: no acc muscle use,  Mild barrel  contour chest wall with bilateral  Distant bs s audible wheeze and  without cough on insp or exp maneuvers  and mild  Hyperresonant  to  percussion bilaterally     CV:  RRR  no s3 or murmur or increase in P2, and no edema   ABD:  soft and nontender with pos end  insp Hoover's  in the supine position.  No bruits or organomegaly appreciated   MS:  Nl gait/ ext warm without deformities Or obvious joint restrictions  calf tenderness, cyanosis or clubbing     SKIN: warm and dry without lesions    NEURO:  alert, approp,  nl sensorium with  no motor or cerebellar deficits apparent.         Assessment

## 2023-09-19 NOTE — Progress Notes (Signed)
 Subjective:  HPI: Gregory Eaton is a 78 y.o. male presenting on 09/19/2023 for No chief complaint on file.   HPI Patient is in today for Xanax refill. He has been on this for 10 years for sleep. Has tried nothing else for sleep. He has been out for 2-3 weeks. He was taking 0.5mg  nightly. He is only sleeping 2 hours per night without this. He also reports feeling jittery since he has discontinued prednisone since being seen in ED recently, he stopped this on Saturday. He reports dyspnea with exertion and wheezing this AM. Denies chest pain, palpitations, pleurisy, swelling of extremities, or fever. Has tried Albuterol 2-3 times yesterday and this AM. Symptoms are unchanged since prior to ED visit.  Workup during that visit included EKG, CT angio chest, CT head, chest XR, Troponins, CBC, BMP, PT-INR, no significant findings. He is seeing Pulmonology today and scheduled with Cardiology 11/09/2023.   Review of Systems  All other systems reviewed and are negative.   Relevant past medical history reviewed and updated as indicated.   Past Medical History:  Diagnosis Date   Adenomatous colon polyp 03/2009   Last colonoscopy by Dr. Jena Gauss    Adenomatous polyp 2010   Adenomatous polyp of colon 11/03/2010   Arthritis    Barrett's esophagus    CAD (coronary artery disease)    Cataract    Complication of anesthesia    has a shortened esophogus due to CA.    COPD (chronic obstructive pulmonary disease) (HCC)    Severe emphysema per CT   Diverticulosis    Emphysema of lung (HCC)    Esophageal carcinoma (HCC) 03/2009   T1N1M0   GERD (gastroesophageal reflux disease)    Glaucoma    Heart murmur    History of Doppler ultrasound 11/09/2011   03/2014- 50-69% L ICA stenosis;carotid doppler; L bulb/prox ICA 0-49% diameter reduction; L vertebral artery - occlusive ds; L ECA  demonstrates severe amount of fibrous plaque   History of Doppler ultrasound 11/09/2011   LEAs; R ABI - mod art. insuff.; L  ABI normal at rest; R SFA - occlusive ds; L SFA - occlusive ds; patent fem-pop graft   History of echocardiogram 08/27/2009   EF >55%   History of hiatal hernia    History of kidney stones 1965   History of nuclear stress test 11/24/2011   lexiscan; normal perfusion; low risk scan; non-diagnostic for ischemia   Hyperlipidemia    Hypertension    Left carotid artery stenosis 04/08/2014   Pneumonia    Pre-diabetes    Pulmonary nodule, right 04/08/2014   2.8 mm-incidental finding on CT   PVD (peripheral vascular disease) (HCC)    Tachyarrhythmia 1999   Status post ablation at [facility]   Tobacco abuse      Past Surgical History:  Procedure Laterality Date   ABDOMINAL AORTOGRAM W/LOWER EXTREMITY N/A 04/01/2021   Procedure: ABDOMINAL AORTOGRAM W/LOWER EXTREMITY;  Surgeon: Victorino Sparrow, MD;  Location: [facility] INVASIVE CV LAB;  Service: Cardiovascular;  Laterality: N/A;   ABDOMINAL AORTOGRAM W/LOWER EXTREMITY N/A 09/01/2022   Procedure: ABDOMINAL AORTOGRAM W/LOWER EXTREMITY;  Surgeon: Victorino Sparrow, MD;  Location: [facility] INVASIVE CV LAB;  Service: Cardiovascular;  Laterality: N/A;   BIOPSY  10/24/2017   Procedure: BIOPSY;  Surgeon: Corbin Ade, MD;  Location: [facility];  Service: Endoscopy;;  esophagus   BIOPSY  10/22/2021   Procedure: BIOPSY;  Surgeon: Corbin Ade, MD;  Location: [facility];  Service: Endoscopy;;   CATARACT EXTRACTION Bilateral    COLONOSCOPY  03/17/2009   Dr.Rourk- normal rectum, sigmoid diverticula, some pale sigmoid mucosa with diffuse petechiae. pedunculated polyp at the splenic flexure, remainder of colonic mucosa appeared normal. bx= adenomatous polyp   COLONOSCOPY  04/19/2003   Dr.Rehman- few diverticu;a at the sigmoid colon, 3 small polyps, one at the transverse colon and 2 at the sigmoid, small external hemorrhoids. bx report not available.    COLONOSCOPY WITH PROPOFOL N/A 10/24/2017   Dr. Jena Gauss: Diverticulosis, 11 mm polyp at the ileocecal valve, tubular  adenoma.  Repeat colonoscopy in 3 years   COLONOSCOPY WITH PROPOFOL N/A 10/22/2021   Procedure: COLONOSCOPY WITH PROPOFOL;  Surgeon: Corbin Ade, MD;  Location: [facility];  Service: Endoscopy;  Laterality: N/A;  11:00am   CORONARY ARTERY BYPASS GRAFT  1998   [name]   ENDARTERECTOMY FEMORAL Right 04/13/2021   Procedure: RIGHT ILIOFEMORAL ARTERY ENDARTERECTOMY WITH PATCH ANGIOPLASTY USING HEMASHIELD PALTINUM FINESSE DACRON PATCH;  Surgeon: Victorino Sparrow, MD;  Location: [facility] OR;  Service: Vascular;  Laterality: Right;   ESOPHAGECTOMY  2010   [facility] Dr. [name]   ESOPHAGOGASTRODUODENOSCOPY  11/25/2010   Dr.Rourk- s/p esophagectomy with gastric pull-up, esophageal erosions straddling the surgical anastomosis, salmon colored epithelium coming up a good centimeter to a centimeter and a half above the suture line, islands of salmon colored epithelium in the most poximal residual esophagus, remainder of gastric mucosa appeared normal. bx= swamous &gastric glandular mucosa w/chronic active inflammation   ESOPHAGOGASTRODUODENOSCOPY  03/17/2009   Dr.Rourk- 4cm segment of salmon-colored epithlium distal esophagus suspicious for barretts esophagus. area of suspicious nodularity w/in this segment bx seperately. small to moderate size hiatal hernia, o/w normal stomach D1 and D2 bx=adenocarcinoma   ESOPHAGOGASTRODUODENOSCOPY (EGD) WITH PROPOFOL N/A 10/24/2017   Dr. Jena Gauss: Remnant of esophagus with anastomosis with stomach at 24 cm from the incisors, 2 cm above the anastomosis he was found to have Barrett's but no dysplasia.  Advised for repeat EGD in 3 years   ESOPHAGOGASTRODUODENOSCOPY (EGD) WITH PROPOFOL N/A 10/22/2021   Procedure: ESOPHAGOGASTRODUODENOSCOPY (EGD) WITH PROPOFOL;  Surgeon: Corbin Ade, MD;  Location: [facility];  Service: Endoscopy;  Laterality: N/A;   EYE SURGERY     cataract   FEMORAL-POPLITEAL BYPASS GRAFT  1993   occlusive ds in R SFA   FEMORAL-POPLITEAL BYPASS GRAFT  Left 05/11/2021   Procedure: LEFT FEMORAL-BELOW KNEE POPLITEAL ARTERY BYPASS GRAFT WITH 6 mm PTFE and COMPLETION ANGIOGRAM;  Surgeon: Victorino Sparrow, MD;  Location: [facility] OR;  Service: Vascular;  Laterality: Left;   GASTRIC PULL THROUGH  2010   With esophagectomy   INSERTION OF ILIAC STENT Bilateral 04/13/2021   Procedure: INSERTION OF BILATERAL COMMON ILIAC KISSING STENTS AND INSERION OF RIGHT EXTERNAL ILIAC STENT;  Surgeon: Victorino Sparrow, MD;  Location: [facility] OR;  Service: Vascular;  Laterality: Bilateral;   JOINT REPLACEMENT     LIPOMA EXCISION  2010   LOWER EXTREMITY ANGIOGRAM Left 04/13/2021   Procedure: LEFT LOWER EXTREMITY ANGIOGRAM ;  Surgeon: Victorino Sparrow, MD;  Location: [facility] OR;  Service: Vascular;  Laterality: Left;   PERIPHERAL VASCULAR INTERVENTION  04/01/2021   Procedure: PERIPHERAL VASCULAR INTERVENTION;  Surgeon: Victorino Sparrow, MD;  Location: [facility] INVASIVE CV LAB;  Service: Cardiovascular;;   PERIPHERAL VASCULAR INTERVENTION Right 09/01/2022   Procedure: PERIPHERAL VASCULAR INTERVENTION;  Surgeon: Victorino Sparrow, MD;  Location: [facility] INVASIVE CV LAB;  Service: Cardiovascular;  Laterality: Right;  right external  iliac   POLYPECTOMY  10/24/2017   Procedure: POLYPECTOMY;  Surgeon: Corbin Ade, MD;  Location: [facility];  Service: Endoscopy;;  colon    POLYPECTOMY  10/22/2021   Procedure: POLYPECTOMY;  Surgeon: Corbin Ade, MD;  Location: [facility];  Service: Endoscopy;;   TOTAL HIP ARTHROPLASTY Left 01/30/2019   Procedure: TOTAL HIP ARTHROPLASTY ANTERIOR APPROACH;  Surgeon: Sheral Apley, MD;  Location: [facility] ORS;  Service: Orthopedics;  Laterality: Left;   ULTRASOUND GUIDANCE FOR VASCULAR ACCESS Left 04/13/2021   Procedure: ULTRASOUND GUIDANCE FOR VASCULAR ACCESS, left femoral;  Surgeon: Victorino Sparrow, MD;  Location: [facility] OR;  Service: Vascular;  Laterality: Left;    Allergies and medications reviewed and updated.   Current Outpatient Medications:     acetaminophen (TYLENOL) 500 MG tablet, Take 1,000 mg by mouth daily as needed for headache., Disp: , Rfl:    albuterol (PROVENTIL) (2.5 MG/3ML) 0.083% nebulizer solution, USE ONE VIAL (2.5MG  TOTAL) IN NEBULIZER EVERY SIX HOURS AS NEEDED FOR WHEEZING OR SHORTNESS OF BREATH, Disp: 75 mL, Rfl: 6   albuterol (VENTOLIN HFA) 108 (90 Base) MCG/ACT inhaler, Inhale 2 puffs into the lungs every 4 (four) hours as needed for wheezing or shortness of breath., Disp: 1 each, Rfl: 0   ALPRAZolam (XANAX) 0.5 MG tablet, Take 1 tablet (0.5 mg total) by mouth at bedtime., Disp: 30 tablet, Rfl: 0   amLODipine (NORVASC) 10 MG tablet, TAKE ONE (1) TABLET BY MOUTH EVERY DAY, Disp: 90 tablet, Rfl: 1   baclofen (LIORESAL) 10 MG tablet, Take 1 tablet (10 mg total) by mouth 3 (three) times daily as needed for muscle spasms., Disp: 20 each, Rfl: 0   brimonidine (ALPHAGAN) 0.2 % ophthalmic solution, SMARTSIG:In Eye(s), Disp: , Rfl:    clopidogrel (PLAVIX) 75 MG tablet, TAKE ONE (1) TABLET BY MOUTH EVERY DAY, Disp: 90 tablet, Rfl: 1   dorzolamide-timolol (COSOPT) 2-0.5 % ophthalmic solution, Place 1 drop into both eyes 2 (two) times daily., Disp: , Rfl:    gabapentin (NEURONTIN) 100 MG capsule, Take 1 capsule (100 mg total) by mouth 3 (three) times daily as needed., Disp: 180 capsule, Rfl: 1   lansoprazole (PREVACID) 30 MG capsule, Take 1 capsule (30 mg total) by mouth daily before supper., Disp: 90 capsule, Rfl: 3   latanoprost (XALATAN) 0.005 % ophthalmic solution, SMARTSIG:In Eye(s), Disp: , Rfl:    losartan (COZAAR) 100 MG tablet, Take 1 tablet (100 mg total) by mouth at bedtime., Disp: 90 tablet, Rfl: 3   magic mouthwash (nystatin, diphenhydrAMINE, alum & mag hydroxide) suspension mixture, Swish and spit 5 mLs 3 (three) times daily as needed for mouth pain., Disp: 240 mL, Rfl: 0   metoprolol succinate (TOPROL-XL) 50 MG 24 hr tablet, Take 1 tablet (50 mg total) by mouth daily. Take with or immediately following a meal., Disp: 90  tablet, Rfl: 1   montelukast (SINGULAIR) 10 MG tablet, Take 1 tablet (10 mg total) by mouth daily., Disp: 90 tablet, Rfl: 3   Multiple Vitamins-Minerals (CENTRUM SILVER PO), Take 1 tablet by mouth daily., Disp: , Rfl:    nitroGLYCERIN (NITROSTAT) 0.4 MG SL tablet, Place 1 tablet (0.4 mg total) under the tongue every 5 (five) minutes as needed for chest pain., Disp: 25 tablet, Rfl: 0   predniSONE (DELTASONE) 50 MG tablet, Take 1 tablet by mouth daily, Disp: 5 tablet, Rfl: 0   rosuvastatin (CRESTOR) 10 MG tablet, Take 1 tablet (10 mg total) by mouth daily., Disp: 90 tablet, Rfl:  1   spironolactone (ALDACTONE) 25 MG tablet, Take 0.5 tablets (12.5 mg total) by mouth daily., Disp: 45 tablet, Rfl: 3   sucralfate (CARAFATE) 1 g tablet, Take 1 tablet (1 g total) by mouth 4 (four) times daily -  with meals and at bedtime. As needed for esophageal pain, heartburn. You can crush in couple of tablespoons of water to make suspension., Disp: 120 tablet, Rfl: 5   traZODone (DESYREL) 50 MG tablet, Take 0.5-1 tablets (25-50 mg total) by mouth at bedtime as needed for sleep., Disp: 30 tablet, Rfl: 3   umeclidinium-vilanterol (ANORO ELLIPTA) 62.5-25 MCG/ACT AEPB, Inhale 1 puff into the lungs daily., Disp: 60 each, Rfl: 11  Allergies  Allergen Reactions   Altace [Ramipril] Cough    Objective:   BP (!) 178/78   Pulse 84   Temp 97.6 F (36.4 C) (Oral)   Ht 5\' 8"  (1.727 m)   Wt 158 lb (71.7 kg)   SpO2 94%   BMI 24.02 kg/m      09/19/2023   12:30 PM 09/19/2023   11:45 AM 09/12/2023    6:06 PM  Vitals with BMI  Height  5\' 8"    Weight  158 lbs   BMI  24.03   Systolic 178 160 409  Diastolic 78 80 73  Pulse  84 87     Physical Exam Vitals and nursing note reviewed.  Constitutional:      Appearance: Normal appearance. He is normal weight.  HENT:     Head: Normocephalic and atraumatic.  Cardiovascular:     Rate and Rhythm: Normal rate and regular rhythm.     Pulses: Normal pulses.     Heart sounds:  Normal heart sounds.  Pulmonary:     Effort: Pulmonary effort is normal.     Breath sounds: Normal breath sounds. Decreased air movement present.  Skin:    General: Skin is warm and dry.     Capillary Refill: Capillary refill takes less than 2 seconds.  Neurological:     General: No focal deficit present.     Mental Status: He is alert and oriented to person, place, and time. Mental status is at baseline.  Psychiatric:        Mood and Affect: Mood normal.        Behavior: Behavior normal.        Thought Content: Thought content normal.        Judgment: Judgment normal.     Assessment & Plan:  Chronic insomnia Assessment & Plan: Has not taken Xanax in 2-3 weeks. Discussed risks vs benefits and he is willing to try alternative. Will start Trazodone 25-50mg  nightly. Return to office in 1 week for follow up and BP recheck.    Dyspnea on exertion Assessment & Plan: BMP, CBC, CXR, EKG, CT angio chest, and CT head negative. BNP drawn today, will proceed with ECHO if indicated, he does see cardiology (next month) and pulmonology (today). BP elevated today in office. He reports normal reading this AM, will recheck in 1 week and continue to montior at home.   Orders: -     Brain natriuretic peptide  Essential hypertension Assessment & Plan: Home readings have been WNL, will monitor over next week and return to office with readings. Reports dry mouth at night and white tongue with Losartan, he will switch this to taking in AM. Advised to discontinue immediately and notify office if swelling occurs. No s/s of thrush, requests magic mouthwash, rx sent. Take medications  as prescribed and bring medications and blood pressure log with cuff to each office visit. Seek medical care for chest pain, palpitations, shortness of breath with exertion, dizziness/lightheadedness, vision changes, recurrent headaches, or swelling of extremities.    Other orders -     traZODone HCl; Take 0.5-1 tablets (25-50  mg total) by mouth at bedtime as needed for sleep.  Dispense: 30 tablet; Refill: 3 -     magic mouthwash (nystatin, diphenhydrAMINE, alum & mag hydroxide) suspension mixture; Swish and spit 5 mLs 3 (three) times daily as needed for mouth pain.  Dispense: 240 mL; Refill: 0     Follow up plan: Return in about 1 week (around 09/26/2023) for follow-up.  Park Meo, FNP

## 2023-09-19 NOTE — Patient Instructions (Addendum)
 Plan A = Automatic = Always=    Anoro one click each am   Plan B = Backup (to supplement plan A, not to replace it) Only use your albuterol inhaler as a rescue medication to be used if you can't catch your breath by resting or doing a relaxed purse lip breathing pattern.  - The less you use it, the better it will work when you need it. - Ok to use the inhaler up to 2 puffs  every 4 hours if you must but call for appointment if use goes up over your usual need - Don't leave home without it !!  (think of it like the spare tire for your car)   Plan C = Crisis (instead of Plan B but only if Plan B stops working) - only use your albuterol nebulizer if you first try Plan B and it fails to help > ok to use the nebulizer up to every 4 hours but if start needing it regularly call for immediate appointment   Increase prevacid to 30 mg Take 30- 60 min before your first and last meals of the day     GERD (REFLUX)  is an extremely common cause of respiratory symptoms just like yours , many times with no obvious heartburn at all.    It can be treated with medication, but also with lifestyle changes including elevation of the head of your bed (ideally with 6 -8inch blocks under the headboard of your bed),  Smoking cessation, avoidance of late meals, excessive alcohol, and avoid fatty foods, chocolate, peppermint, colas, red wine, and acidic juices such as orange juice.  NO MINT OR MENTHOL PRODUCTS SO NO COUGH DROPS - LUDEN's  USE SUGARLESS CANDY INSTEAD (Jolley ranchers or Stover's or Life Savers) or even ice chips will also do - the key is to swallow to prevent all throat clearing. NO OIL BASED VITAMINS - use powdered substitutes.  Avoid fish oil when coughing.   We will order you a new nebulizer

## 2023-09-19 NOTE — Assessment & Plan Note (Signed)
 Has not taken Xanax in 2-3 weeks. Discussed risks vs benefits and he is willing to try alternative. Will start Trazodone 25-50mg  nightly. Return to office in 1 week for follow up and BP recheck.

## 2023-09-20 LAB — BRAIN NATRIURETIC PEPTIDE: Brain Natriuretic Peptide: 30 pg/mL (ref <100)

## 2023-09-20 NOTE — Assessment & Plan Note (Addendum)
 Quit smoking  1997  PFT's 03/2015   FEV1 2.24 (72 % ) ratio 522  p 3 % improvement from saba with DLCO  55 % corrects to 57 % for alv volume - 12/19/2018  After extensive coaching inhaler device,  effectiveness =    90% with dpi and 75% with hfa > try bevespi to reduce upper airway irritation   He has already seen pcp and ER for same problem and no better p prednisone but I don't think he's really having AECOPD  and we know he ruled out for MI s obvious chf and actually has baseline ex tol exertional component to CP  with prominent pseudowheeze on exam which suggests GERD (can't rule out adverse effects of anoro though he hasn't really been using it as a maint rx anyway) and anxiety also may be playing a role here.  Rec Max rx for GERD/ no mint products, diet reviewed No more pred for now  ABC action plan reviewed, new neb provided  F/u in this clinic as planned, call sooner if needed.          Each maintenance medication was reviewed in detail including emphasizing most importantly the difference between maintenance and prns and under what circumstances the prns are to be triggered using an action plan format where appropriate.  Total time for H and P, chart review, counseling, reviewing hfa/dpi/neb  device(s) and generating customized AVS unique to this ACUTE office visit / same day charting = 36  min for   refractory respiratory  symptoms of uncertain etiology

## 2023-09-21 DIAGNOSIS — J449 Chronic obstructive pulmonary disease, unspecified: Secondary | ICD-10-CM | POA: Diagnosis not present

## 2023-09-22 ENCOUNTER — Encounter: Payer: Self-pay | Admitting: Vascular Surgery

## 2023-09-27 ENCOUNTER — Ambulatory Visit (INDEPENDENT_AMBULATORY_CARE_PROVIDER_SITE_OTHER): Admitting: Family Medicine

## 2023-09-27 ENCOUNTER — Encounter: Payer: Self-pay | Admitting: Family Medicine

## 2023-09-27 VITALS — BP 128/64 | HR 72 | Temp 97.7°F | Ht 68.0 in | Wt 157.2 lb

## 2023-09-27 DIAGNOSIS — I10 Essential (primary) hypertension: Secondary | ICD-10-CM | POA: Diagnosis not present

## 2023-09-27 DIAGNOSIS — K219 Gastro-esophageal reflux disease without esophagitis: Secondary | ICD-10-CM

## 2023-09-27 MED ORDER — GABAPENTIN 100 MG PO CAPS
ORAL_CAPSULE | ORAL | 1 refills | Status: DC
Start: 2023-09-27 — End: 2024-05-31

## 2023-09-27 NOTE — Progress Notes (Signed)
 Subjective:  HPI: Gregory Eaton is a 78 y.o. male presenting on 09/27/2023 for Follow-up (Pt states sleeping has improved some. Pt also c/o issues with acid reflux. ) and Hypertension (BP at home 152/70, 148/72, )   Hypertension   Patient is in today for blood pressure follow-up. He reports home readings have been as high as 167/75 and as low as 110s/60-70s.Marland Kitchen He is checking twice daily. This AM readings at home were 152/70 and 148/72 prior to taking his medications, readings are higher in AM and he does endorse occipital headache in AM described as "something swimming around". He is currently taking Amlodipine, Metoprolol, and Spironolactone in AM and Losartan in PM.  Gregory Eaton also endorses uncontrolled acid reflux that has improved with current regimen lansoprazole and carafate. Denies vomiting or dysphagia.  Gregory Eaton also endorses worsening neuropathic foot pain at night during foot exam. Currently taking Gabapentin 100mg  TID.  HYPERTENSION without Chronic Kidney Disease Hypertension status: stable  Satisfied with current treatment? no Duration of hypertension: chronic BP monitoring frequency:  a few times a day BP range: 110/60-167/75 BP medication side effects:  no Medication compliance: excellent compliance Previous BP meds:amlodipine, HCTZ, losartan (cozaar), and spironalactone Aspirin: no Recurrent headaches: yes Visual changes: no Palpitations: no Dyspnea: no Chest pain: no Lower extremity edema: no Dizzy/lightheaded: no   Review of Systems  All other systems reviewed and are negative.   Relevant past medical history reviewed and updated as indicated.   Past Medical History:  Diagnosis Date   Adenomatous colon polyp 03/2009   Last colonoscopy by Dr. Jena Gauss    Adenomatous polyp 2010   Adenomatous polyp of colon 11/03/2010   Arthritis    Barrett's esophagus    CAD (coronary artery disease)    Cataract    Complication of anesthesia    has a shortened esophogus  due to CA.    COPD (chronic obstructive pulmonary disease) (HCC)    Severe emphysema per CT   Diverticulosis    Emphysema of lung (HCC)    Esophageal carcinoma (HCC) 03/2009   T1N1M0   GERD (gastroesophageal reflux disease)    Glaucoma    Heart murmur    History of Doppler ultrasound 11/09/2011   03/2014- 50-69% L ICA stenosis;carotid doppler; L bulb/prox ICA 0-49% diameter reduction; L vertebral artery - occlusive ds; L ECA  demonstrates severe amount of fibrous plaque   History of Doppler ultrasound 11/09/2011   LEAs; R ABI - mod art. insuff.; L ABI normal at rest; R SFA - occlusive ds; L SFA - occlusive ds; patent fem-pop graft   History of echocardiogram 08/27/2009   EF >55%   History of hiatal hernia    History of kidney stones 1965   History of nuclear stress test 11/24/2011   lexiscan; normal perfusion; low risk scan; non-diagnostic for ischemia   Hyperlipidemia    Hypertension    Left carotid artery stenosis 04/08/2014   Pneumonia    Pre-diabetes    Pulmonary nodule, right 04/08/2014   2.8 mm-incidental finding on CT   PVD (peripheral vascular disease) (HCC)    Tachyarrhythmia 1999   Status post ablation at [facility]   Tobacco abuse      Past Surgical History:  Procedure Laterality Date   ABDOMINAL AORTOGRAM W/LOWER EXTREMITY N/A 04/01/2021   Procedure: ABDOMINAL AORTOGRAM W/LOWER EXTREMITY;  Surgeon: Victorino Sparrow, MD;  Location: [facility] INVASIVE CV LAB;  Service: Cardiovascular;  Laterality: N/A;   ABDOMINAL AORTOGRAM W/LOWER EXTREMITY N/A 09/01/2022  Procedure: ABDOMINAL AORTOGRAM W/LOWER EXTREMITY;  Surgeon: Victorino Sparrow, MD;  Location: [facility] INVASIVE CV LAB;  Service: Cardiovascular;  Laterality: N/A;   BIOPSY  10/24/2017   Procedure: BIOPSY;  Surgeon: Corbin Ade, MD;  Location: [facility];  Service: Endoscopy;;  esophagus   BIOPSY  10/22/2021   Procedure: BIOPSY;  Surgeon: Corbin Ade, MD;  Location: [facility];  Service: Endoscopy;;   CATARACT  EXTRACTION Bilateral    COLONOSCOPY  03/17/2009   Dr.Rourk- normal rectum, sigmoid diverticula, some pale sigmoid mucosa with diffuse petechiae. pedunculated polyp at the splenic flexure, remainder of colonic mucosa appeared normal. bx= adenomatous polyp   COLONOSCOPY  04/19/2003   Dr.Rehman- few diverticu;a at the sigmoid colon, 3 small polyps, one at the transverse colon and 2 at the sigmoid, small external hemorrhoids. bx report not available.    COLONOSCOPY WITH PROPOFOL N/A 10/24/2017   Dr. Jena Gauss: Diverticulosis, 11 mm polyp at the ileocecal valve, tubular adenoma.  Repeat colonoscopy in 3 years   COLONOSCOPY WITH PROPOFOL N/A 10/22/2021   Procedure: COLONOSCOPY WITH PROPOFOL;  Surgeon: Corbin Ade, MD;  Location: [facility];  Service: Endoscopy;  Laterality: N/A;  11:00am   CORONARY ARTERY BYPASS GRAFT  1998   [name]   ENDARTERECTOMY FEMORAL Right 04/13/2021   Procedure: RIGHT ILIOFEMORAL ARTERY ENDARTERECTOMY WITH PATCH ANGIOPLASTY USING HEMASHIELD PALTINUM FINESSE DACRON PATCH;  Surgeon: Victorino Sparrow, MD;  Location: [facility] OR;  Service: Vascular;  Laterality: Right;   ESOPHAGECTOMY  2010   [facility] Dr. [name]   ESOPHAGOGASTRODUODENOSCOPY  11/25/2010   Dr.Rourk- s/p esophagectomy with gastric pull-up, esophageal erosions straddling the surgical anastomosis, salmon colored epithelium coming up a good centimeter to a centimeter and a half above the suture line, islands of salmon colored epithelium in the most poximal residual esophagus, remainder of gastric mucosa appeared normal. bx= swamous &gastric glandular mucosa w/chronic active inflammation   ESOPHAGOGASTRODUODENOSCOPY  03/17/2009   Dr.Rourk- 4cm segment of salmon-colored epithlium distal esophagus suspicious for barretts esophagus. area of suspicious nodularity w/in this segment bx seperately. small to moderate size hiatal hernia, o/w normal stomach D1 and D2 bx=adenocarcinoma   ESOPHAGOGASTRODUODENOSCOPY (EGD) WITH  PROPOFOL N/A 10/24/2017   Dr. Jena Gauss: Remnant of esophagus with anastomosis with stomach at 24 cm from the incisors, 2 cm above the anastomosis he was found to have Barrett's but no dysplasia.  Advised for repeat EGD in 3 years   ESOPHAGOGASTRODUODENOSCOPY (EGD) WITH PROPOFOL N/A 10/22/2021   Procedure: ESOPHAGOGASTRODUODENOSCOPY (EGD) WITH PROPOFOL;  Surgeon: Corbin Ade, MD;  Location: [facility];  Service: Endoscopy;  Laterality: N/A;   EYE SURGERY     cataract   FEMORAL-POPLITEAL BYPASS GRAFT  1993   occlusive ds in R SFA   FEMORAL-POPLITEAL BYPASS GRAFT Left 05/11/2021   Procedure: LEFT FEMORAL-BELOW KNEE POPLITEAL ARTERY BYPASS GRAFT WITH 6 mm PTFE and COMPLETION ANGIOGRAM;  Surgeon: Victorino Sparrow, MD;  Location: [facility] OR;  Service: Vascular;  Laterality: Left;   GASTRIC PULL THROUGH  2010   With esophagectomy   INSERTION OF ILIAC STENT Bilateral 04/13/2021   Procedure: INSERTION OF BILATERAL COMMON ILIAC KISSING STENTS AND INSERION OF RIGHT EXTERNAL ILIAC STENT;  Surgeon: Victorino Sparrow, MD;  Location: [facility] OR;  Service: Vascular;  Laterality: Bilateral;   JOINT REPLACEMENT     LIPOMA EXCISION  2010   LOWER EXTREMITY ANGIOGRAM Left 04/13/2021   Procedure: LEFT LOWER EXTREMITY ANGIOGRAM ;  Surgeon: Victorino Sparrow, MD;  Location: [facility]  OR;  Service: Vascular;  Laterality: Left;   PERIPHERAL VASCULAR INTERVENTION  04/01/2021   Procedure: PERIPHERAL VASCULAR INTERVENTION;  Surgeon: Victorino Sparrow, MD;  Location: [facility] INVASIVE CV LAB;  Service: Cardiovascular;;   PERIPHERAL VASCULAR INTERVENTION Right 09/01/2022   Procedure: PERIPHERAL VASCULAR INTERVENTION;  Surgeon: Victorino Sparrow, MD;  Location: [facility] INVASIVE CV LAB;  Service: Cardiovascular;  Laterality: Right;  right external iliac   POLYPECTOMY  10/24/2017   Procedure: POLYPECTOMY;  Surgeon: Corbin Ade, MD;  Location: [facility];  Service: Endoscopy;;  colon    POLYPECTOMY  10/22/2021   Procedure: POLYPECTOMY;  Surgeon: Corbin Ade, MD;  Location: [facility];  Service: Endoscopy;;   TOTAL HIP ARTHROPLASTY Left 01/30/2019   Procedure: TOTAL HIP ARTHROPLASTY ANTERIOR APPROACH;  Surgeon: Sheral Apley, MD;  Location: [facility] ORS;  Service: Orthopedics;  Laterality: Left;   ULTRASOUND GUIDANCE FOR VASCULAR ACCESS Left 04/13/2021   Procedure: ULTRASOUND GUIDANCE FOR VASCULAR ACCESS, left femoral;  Surgeon: Victorino Sparrow, MD;  Location: [facility] OR;  Service: Vascular;  Laterality: Left;    Allergies and medications reviewed and updated.   Current Outpatient Medications:    acetaminophen (TYLENOL) 500 MG tablet, Take 1,000 mg by mouth daily as needed for headache., Disp: , Rfl:    albuterol (PROVENTIL) (2.5 MG/3ML) 0.083% nebulizer solution, USE ONE VIAL (2.5MG  TOTAL) IN NEBULIZER EVERY SIX HOURS AS NEEDED FOR WHEEZING OR SHORTNESS OF BREATH, Disp: 75 mL, Rfl: 6   albuterol (VENTOLIN HFA) 108 (90 Base) MCG/ACT inhaler, Inhale 2 puffs into the lungs every 4 (four) hours as needed for wheezing or shortness of breath., Disp: 1 each, Rfl: 0   ALPRAZolam (XANAX) 0.5 MG tablet, Take 1 tablet (0.5 mg total) by mouth at bedtime., Disp: 30 tablet, Rfl: 0   amLODipine (NORVASC) 10 MG tablet, TAKE ONE (1) TABLET BY MOUTH EVERY DAY, Disp: 90 tablet, Rfl: 1   baclofen (LIORESAL) 10 MG tablet, Take 1 tablet (10 mg total) by mouth 3 (three) times daily as needed for muscle spasms., Disp: 20 each, Rfl: 0   brimonidine (ALPHAGAN) 0.2 % ophthalmic solution, SMARTSIG:In Eye(s), Disp: , Rfl:    clopidogrel (PLAVIX) 75 MG tablet, TAKE ONE (1) TABLET BY MOUTH EVERY DAY, Disp: 90 tablet, Rfl: 1   dorzolamide-timolol (COSOPT) 2-0.5 % ophthalmic solution, Place 1 drop into both eyes 2 (two) times daily., Disp: , Rfl:    lansoprazole (PREVACID) 30 MG capsule, Take 1 capsule (30 mg total) by mouth daily before supper., Disp: 90 capsule, Rfl: 3   latanoprost (XALATAN) 0.005 % ophthalmic solution, SMARTSIG:In Eye(s), Disp: , Rfl:    losartan  (COZAAR) 100 MG tablet, Take 1 tablet (100 mg total) by mouth at bedtime., Disp: 90 tablet, Rfl: 3   magic mouthwash (nystatin, diphenhydrAMINE, alum & mag hydroxide) suspension mixture, Swish and spit 5 mLs 3 (three) times daily as needed for mouth pain., Disp: 240 mL, Rfl: 0   metoprolol succinate (TOPROL-XL) 50 MG 24 hr tablet, Take 1 tablet (50 mg total) by mouth daily. Take with or immediately following a meal., Disp: 90 tablet, Rfl: 1   montelukast (SINGULAIR) 10 MG tablet, Take 1 tablet (10 mg total) by mouth daily., Disp: 90 tablet, Rfl: 3   Multiple Vitamins-Minerals (CENTRUM SILVER PO), Take 1 tablet by mouth daily., Disp: , Rfl:    nitroGLYCERIN (NITROSTAT) 0.4 MG SL tablet, Place 1 tablet (0.4 mg total) under the tongue every 5 (five) minutes as needed for chest pain.,  Disp: 25 tablet, Rfl: 0   rosuvastatin (CRESTOR) 10 MG tablet, Take 1 tablet (10 mg total) by mouth daily., Disp: 90 tablet, Rfl: 1   spironolactone (ALDACTONE) 25 MG tablet, Take 0.5 tablets (12.5 mg total) by mouth daily., Disp: 45 tablet, Rfl: 3   sucralfate (CARAFATE) 1 g tablet, Take 1 tablet (1 g total) by mouth 4 (four) times daily -  with meals and at bedtime. As needed for esophageal pain, heartburn. You can crush in couple of tablespoons of water to make suspension., Disp: 120 tablet, Rfl: 5   traZODone (DESYREL) 50 MG tablet, Take 0.5-1 tablets (25-50 mg total) by mouth at bedtime as needed for sleep., Disp: 30 tablet, Rfl: 3   umeclidinium-vilanterol (ANORO ELLIPTA) 62.5-25 MCG/ACT AEPB, Inhale 1 puff into the lungs daily., Disp: 60 each, Rfl: 11   gabapentin (NEURONTIN) 100 MG capsule, Take 1 capsule (100 mg total) by mouth 2 (two) times daily AND 2 capsules (200 mg total) at bedtime., Disp: 360 capsule, Rfl: 1  Allergies  Allergen Reactions   Altace [Ramipril] Cough    Objective:   BP 128/64   Pulse 72   Temp 97.7 F (36.5 C)   Ht 5\' 8"  (1.727 m)   Wt 157 lb 3.2 oz (71.3 kg)   SpO2 99%   BMI 23.90  kg/m      09/27/2023    8:45 AM 09/20/2023    9:04 AM 09/19/2023    2:38 PM  Vitals with BMI  Height 5\' 8"   5\' 8"   Weight 157 lbs 3 oz  158 lbs  BMI 23.91  24.03  Systolic 128 148 295  Diastolic 64 67 52  Pulse 72  99     Physical Exam Vitals and nursing note reviewed.  Constitutional:      Appearance: Normal appearance. He is normal weight.  HENT:     Head: Normocephalic and atraumatic.  Cardiovascular:     Rate and Rhythm: Normal rate and regular rhythm.     Pulses: Normal pulses.     Heart sounds: Normal heart sounds.  Pulmonary:     Effort: Pulmonary effort is normal.     Breath sounds: Normal breath sounds.  Skin:    General: Skin is warm and dry.     Capillary Refill: Capillary refill takes less than 2 seconds.  Neurological:     General: No focal deficit present.     Mental Status: He is alert and oriented to person, place, and time. Mental status is at baseline.  Psychiatric:        Mood and Affect: Mood normal.        Behavior: Behavior normal.        Thought Content: Thought content normal.        Judgment: Judgment normal.    Diabetic Foot Exam - Simple   Simple Foot Form Visual Inspection No deformities, no ulcerations, no other skin breakdown bilaterally: Yes Sensation Testing Intact to touch and monofilament testing bilaterally: Yes Pulse Check Posterior Tibialis and Dorsalis pulse intact bilaterally: Yes Comments      Assessment & Plan:  Essential hypertension Assessment & Plan: 128/64 today in office. Home readings have been elevated in AM prior to medications. Will switch Amlodipine to PM and continue monitoring BID. Recommend heart healthy diet such as Mediterranean diet with whole grains, fruits, vegetable, fish, lean meats, nuts, and olive oil. Limit salt. Encouraged moderate walking, 3-5 times/week for 30-50 minutes each session. Aim for at least 150 minutes.week.  Goal should be pace of 3 miles/hours, or walking 1.5 miles in 30 minutes. Avoid  tobacco products. Avoid excess alcohol. Take medications as prescribed and bring medications and blood pressure log with cuff to each office visit. Seek medical care for chest pain, palpitations, shortness of breath with exertion, dizziness/lightheadedness, vision changes, recurrent headaches, or swelling of extremities. Follow up in 2-4 weeks.    Gastroesophageal reflux disease without esophagitis Assessment & Plan: Refractory GERD s/p esophagectomy 2010.  Followed by GI, due to schedule. Continue Carafate and Lansoprazole as prescribed Elevated HOB if needed and avoid lying down 2-3 hours after eating, avoid coffee, alcohol, chocolate, fatty foods, citrus, carbonated beverages, spicy foods, late meals, and smoking. Return to office if symptoms return or worsen and seek medical care for difficulty swallowing, bleeding, anemia, weight loss, recurrent vomiting     Other orders -     Gabapentin; Take 1 capsule (100 mg total) by mouth 2 (two) times daily AND 2 capsules (200 mg total) at bedtime.  Dispense: 360 capsule; Refill: 1     Follow up plan: Return in about 2 weeks (around 10/11/2023) for hypertension.  Park Meo, FNP

## 2023-09-27 NOTE — Assessment & Plan Note (Signed)
 Refractory GERD s/p esophagectomy 2010.  Followed by GI, due to schedule. Continue Carafate and Lansoprazole as prescribed Elevated HOB if needed and avoid lying down 2-3 hours after eating, avoid coffee, alcohol, chocolate, fatty foods, citrus, carbonated beverages, spicy foods, late meals, and smoking. Return to office if symptoms return or worsen and seek medical care for difficulty swallowing, bleeding, anemia, weight loss, recurrent vomiting

## 2023-09-27 NOTE — Assessment & Plan Note (Signed)
 128/64 today in office. Home readings have been elevated in AM prior to medications. Will switch Amlodipine to PM and continue monitoring BID. Recommend heart healthy diet such as Mediterranean diet with whole grains, fruits, vegetable, fish, lean meats, nuts, and olive oil. Limit salt. Encouraged moderate walking, 3-5 times/week for 30-50 minutes each session. Aim for at least 150 minutes.week. Goal should be pace of 3 miles/hours, or walking 1.5 miles in 30 minutes. Avoid tobacco products. Avoid excess alcohol. Take medications as prescribed and bring medications and blood pressure log with cuff to each office visit. Seek medical care for chest pain, palpitations, shortness of breath with exertion, dizziness/lightheadedness, vision changes, recurrent headaches, or swelling of extremities. Follow up in 2-4 weeks.

## 2023-10-12 ENCOUNTER — Encounter: Payer: Self-pay | Admitting: Family Medicine

## 2023-10-12 ENCOUNTER — Ambulatory Visit: Admitting: Family Medicine

## 2023-10-12 VITALS — BP 130/72 | HR 80 | Temp 98.2°F | Ht 68.0 in | Wt 157.8 lb

## 2023-10-12 DIAGNOSIS — I10 Essential (primary) hypertension: Secondary | ICD-10-CM | POA: Diagnosis not present

## 2023-10-12 NOTE — Progress Notes (Signed)
 Subjective:  HPI: Gregory Eaton is a 78 y.o. male presenting on 10/12/2023 for Follow-up (HTN follow up)   HPI Patient is in today for blood pressure follow-up.  HYPERTENSION without Chronic Kidney Disease Hypertension status: controlled  Satisfied with current treatment? yes Duration of hypertension: chronic BP monitoring frequency:  a few times a day BP range: 97/46 - 141/69 BP medication side effects:  no Medication compliance: excellent compliance Previous BP meds:amlodipine, losartan (cozaar), and spironalactone, metoprolol Aspirin: on plavix Recurrent headaches: no Visual changes: no Palpitations: no Dyspnea: no Chest pain: no Lower extremity edema: no Dizzy/lightheaded: no   Review of Systems  All other systems reviewed and are negative.   Relevant past medical history reviewed and updated as indicated.   Past Medical History:  Diagnosis Date   Adenomatous colon polyp 03/2009   Last colonoscopy by Dr. Jena Gauss    Adenomatous polyp 2010   Adenomatous polyp of colon 11/03/2010   Arthritis    Barrett's esophagus    CAD (coronary artery disease)    Cataract    Complication of anesthesia    has a shortened esophogus due to CA.    COPD (chronic obstructive pulmonary disease) (HCC)    Severe emphysema per CT   Diverticulosis    Emphysema of lung (HCC)    Esophageal carcinoma (HCC) 03/2009   T1N1M0   GERD (gastroesophageal reflux disease)    Glaucoma    Heart murmur    History of Doppler ultrasound 11/09/2011   03/2014- 50-69% L ICA stenosis;carotid doppler; L bulb/prox ICA 0-49% diameter reduction; L vertebral artery - occlusive ds; L ECA  demonstrates severe amount of fibrous plaque   History of Doppler ultrasound 11/09/2011   LEAs; R ABI - mod art. insuff.; L ABI normal at rest; R SFA - occlusive ds; L SFA - occlusive ds; patent fem-pop graft   History of echocardiogram 08/27/2009   EF >55%   History of hiatal hernia    History of kidney stones 1965    History of nuclear stress test 11/24/2011   lexiscan; normal perfusion; low risk scan; non-diagnostic for ischemia   Hyperlipidemia    Hypertension    Left carotid artery stenosis 04/08/2014   Pneumonia    Pre-diabetes    Pulmonary nodule, right 04/08/2014   2.8 mm-incidental finding on CT   PVD (peripheral vascular disease) (HCC)    Tachyarrhythmia 1999   Status post ablation at [facility]   Tobacco abuse      Past Surgical History:  Procedure Laterality Date   ABDOMINAL AORTOGRAM W/LOWER EXTREMITY N/A 04/01/2021   Procedure: ABDOMINAL AORTOGRAM W/LOWER EXTREMITY;  Surgeon: Victorino Sparrow, MD;  Location: [facility] INVASIVE CV LAB;  Service: Cardiovascular;  Laterality: N/A;   ABDOMINAL AORTOGRAM W/LOWER EXTREMITY N/A 09/01/2022   Procedure: ABDOMINAL AORTOGRAM W/LOWER EXTREMITY;  Surgeon: Victorino Sparrow, MD;  Location: [facility] INVASIVE CV LAB;  Service: Cardiovascular;  Laterality: N/A;   BIOPSY  10/24/2017   Procedure: BIOPSY;  Surgeon: Corbin Ade, MD;  Location: [facility];  Service: Endoscopy;;  esophagus   BIOPSY  10/22/2021   Procedure: BIOPSY;  Surgeon: Corbin Ade, MD;  Location: [facility];  Service: Endoscopy;;   CATARACT EXTRACTION Bilateral    COLONOSCOPY  03/17/2009   Dr.Rourk- normal rectum, sigmoid diverticula, some pale sigmoid mucosa with diffuse petechiae. pedunculated polyp at the splenic flexure, remainder of colonic mucosa appeared normal. bx= adenomatous polyp   COLONOSCOPY  04/19/2003   Dr.Rehman- few diverticu;a at  the sigmoid colon, 3 small polyps, one at the transverse colon and 2 at the sigmoid, small external hemorrhoids. bx report not available.    COLONOSCOPY WITH PROPOFOL N/A 10/24/2017   Dr. Jena Gauss: Diverticulosis, 11 mm polyp at the ileocecal valve, tubular adenoma.  Repeat colonoscopy in 3 years   COLONOSCOPY WITH PROPOFOL N/A 10/22/2021   Procedure: COLONOSCOPY WITH PROPOFOL;  Surgeon: Corbin Ade, MD;  Location: [facility];  Service: Endoscopy;   Laterality: N/A;  11:00am   CORONARY ARTERY BYPASS GRAFT  1998   [name]   ENDARTERECTOMY FEMORAL Right 04/13/2021   Procedure: RIGHT ILIOFEMORAL ARTERY ENDARTERECTOMY WITH PATCH ANGIOPLASTY USING HEMASHIELD PALTINUM FINESSE DACRON PATCH;  Surgeon: Victorino Sparrow, MD;  Location: [facility] OR;  Service: Vascular;  Laterality: Right;   ESOPHAGECTOMY  2010   [facility] Dr. [name]   ESOPHAGOGASTRODUODENOSCOPY  11/25/2010   Dr.Rourk- s/p esophagectomy with gastric pull-up, esophageal erosions straddling the surgical anastomosis, salmon colored epithelium coming up a good centimeter to a centimeter and a half above the suture line, islands of salmon colored epithelium in the most poximal residual esophagus, remainder of gastric mucosa appeared normal. bx= swamous &gastric glandular mucosa w/chronic active inflammation   ESOPHAGOGASTRODUODENOSCOPY  03/17/2009   Dr.Rourk- 4cm segment of salmon-colored epithlium distal esophagus suspicious for barretts esophagus. area of suspicious nodularity w/in this segment bx seperately. small to moderate size hiatal hernia, o/w normal stomach D1 and D2 bx=adenocarcinoma   ESOPHAGOGASTRODUODENOSCOPY (EGD) WITH PROPOFOL N/A 10/24/2017   Dr. Jena Gauss: Remnant of esophagus with anastomosis with stomach at 24 cm from the incisors, 2 cm above the anastomosis he was found to have Barrett's but no dysplasia.  Advised for repeat EGD in 3 years   ESOPHAGOGASTRODUODENOSCOPY (EGD) WITH PROPOFOL N/A 10/22/2021   Procedure: ESOPHAGOGASTRODUODENOSCOPY (EGD) WITH PROPOFOL;  Surgeon: Corbin Ade, MD;  Location: [facility];  Service: Endoscopy;  Laterality: N/A;   EYE SURGERY     cataract   FEMORAL-POPLITEAL BYPASS GRAFT  1993   occlusive ds in R SFA   FEMORAL-POPLITEAL BYPASS GRAFT Left 05/11/2021   Procedure: LEFT FEMORAL-BELOW KNEE POPLITEAL ARTERY BYPASS GRAFT WITH 6 mm PTFE and COMPLETION ANGIOGRAM;  Surgeon: Victorino Sparrow, MD;  Location[facility] OR;  Service: Vascular;   Laterality: Left;   GASTRIC PULL THROUGH  2010   With esophagectomy   INSERTION OF ILIAC STENT Bilateral 04/13/2021   Procedure: INSERTION OF BILATERAL COMMON ILIAC KISSING STENTS AND INSERION OF RIGHT EXTERNAL ILIAC STENT;  Surgeon: Victorino Sparrow, MD;  Location: [facility] OR;  Service: Vascular;  Laterality: Bilateral;   JOINT REPLACEMENT     LIPOMA EXCISION  2010   LOWER EXTREMITY ANGIOGRAM Left 04/13/2021   Procedure: LEFT LOWER EXTREMITY ANGIOGRAM ;  Surgeon: Victorino Sparrow, MD;  Location: [facility] OR;  Service: Vascular;  Laterality: Left;   PERIPHERAL VASCULAR INTERVENTION  04/01/2021   Procedure: PERIPHERAL VASCULAR INTERVENTION;  Surgeon: Victorino Sparrow, MD;  Location: [facility] INVASIVE CV LAB;  Service: Cardiovascular;;   PERIPHERAL VASCULAR INTERVENTION Right 09/01/2022   Procedure: PERIPHERAL VASCULAR INTERVENTION;  Surgeon: Victorino Sparrow, MD;  Location: [facility] INVASIVE CV LAB;  Service: Cardiovascular;  Laterality: Right;  right external iliac   POLYPECTOMY  10/24/2017   Procedure: POLYPECTOMY;  Surgeon: Corbin Ade, MD;  Location: [facility];  Service: Endoscopy;;  colon    POLYPECTOMY  10/22/2021   Procedure: POLYPECTOMY;  Surgeon: Corbin Ade, MD;  Location: [facility];  Service: Endoscopy;;   TOTAL  HIP ARTHROPLASTY Left 01/30/2019   Procedure: TOTAL HIP ARTHROPLASTY ANTERIOR APPROACH;  Surgeon: Sheral Apley, MD;  Location: [facility] ORS;  Service: Orthopedics;  Laterality: Left;   ULTRASOUND GUIDANCE FOR VASCULAR ACCESS Left 04/13/2021   Procedure: ULTRASOUND GUIDANCE FOR VASCULAR ACCESS, left femoral;  Surgeon: Victorino Sparrow, MD;  Location: [facility] OR;  Service: Vascular;  Laterality: Left;    Allergies and medications reviewed and updated.   Current Outpatient Medications:    acetaminophen (TYLENOL) 500 MG tablet, Take 1,000 mg by mouth daily as needed for headache., Disp: , Rfl:    albuterol (PROVENTIL) (2.5 MG/3ML) 0.083% nebulizer solution, USE ONE VIAL (2.5MG  TOTAL) IN  NEBULIZER EVERY SIX HOURS AS NEEDED FOR WHEEZING OR SHORTNESS OF BREATH, Disp: 75 mL, Rfl: 6   albuterol (VENTOLIN HFA) 108 (90 Base) MCG/ACT inhaler, Inhale 2 puffs into the lungs every 4 (four) hours as needed for wheezing or shortness of breath., Disp: 1 each, Rfl: 0   ALPRAZolam (XANAX) 0.5 MG tablet, Take 1 tablet (0.5 mg total) by mouth at bedtime., Disp: 30 tablet, Rfl: 0   amLODipine (NORVASC) 10 MG tablet, TAKE ONE (1) TABLET BY MOUTH EVERY DAY, Disp: 90 tablet, Rfl: 1   baclofen (LIORESAL) 10 MG tablet, Take 1 tablet (10 mg total) by mouth 3 (three) times daily as needed for muscle spasms., Disp: 20 each, Rfl: 0   brimonidine (ALPHAGAN) 0.2 % ophthalmic solution, SMARTSIG:In Eye(s), Disp: , Rfl:    clopidogrel (PLAVIX) 75 MG tablet, TAKE ONE (1) TABLET BY MOUTH EVERY DAY, Disp: 90 tablet, Rfl: 1   dorzolamide-timolol (COSOPT) 2-0.5 % ophthalmic solution, Place 1 drop into both eyes 2 (two) times daily., Disp: , Rfl:    gabapentin (NEURONTIN) 100 MG capsule, Take 1 capsule (100 mg total) by mouth 2 (two) times daily AND 2 capsules (200 mg total) at bedtime., Disp: 360 capsule, Rfl: 1   lansoprazole (PREVACID) 30 MG capsule, Take 1 capsule (30 mg total) by mouth daily before supper., Disp: 90 capsule, Rfl: 3   latanoprost (XALATAN) 0.005 % ophthalmic solution, SMARTSIG:In Eye(s), Disp: , Rfl:    losartan (COZAAR) 100 MG tablet, Take 1 tablet (100 mg total) by mouth at bedtime., Disp: 90 tablet, Rfl: 3   magic mouthwash (nystatin, diphenhydrAMINE, alum & mag hydroxide) suspension mixture, Swish and spit 5 mLs 3 (three) times daily as needed for mouth pain., Disp: 240 mL, Rfl: 0   metoprolol succinate (TOPROL-XL) 50 MG 24 hr tablet, Take 1 tablet (50 mg total) by mouth daily. Take with or immediately following a meal., Disp: 90 tablet, Rfl: 1   montelukast (SINGULAIR) 10 MG tablet, Take 1 tablet (10 mg total) by mouth daily., Disp: 90 tablet, Rfl: 3   Multiple Vitamins-Minerals (CENTRUM SILVER  PO), Take 1 tablet by mouth daily., Disp: , Rfl:    nitroGLYCERIN (NITROSTAT) 0.4 MG SL tablet, Place 1 tablet (0.4 mg total) under the tongue every 5 (five) minutes as needed for chest pain., Disp: 25 tablet, Rfl: 0   rosuvastatin (CRESTOR) 10 MG tablet, Take 1 tablet (10 mg total) by mouth daily., Disp: 90 tablet, Rfl: 1   spironolactone (ALDACTONE) 25 MG tablet, Take 0.5 tablets (12.5 mg total) by mouth daily., Disp: 45 tablet, Rfl: 3   sucralfate (CARAFATE) 1 g tablet, Take 1 tablet (1 g total) by mouth 4 (four) times daily -  with meals and at bedtime. As needed for esophageal pain, heartburn. You can crush in couple of tablespoons of water to make  suspension., Disp: 120 tablet, Rfl: 5   traZODone (DESYREL) 50 MG tablet, Take 0.5-1 tablets (25-50 mg total) by mouth at bedtime as needed for sleep., Disp: 30 tablet, Rfl: 3   umeclidinium-vilanterol (ANORO ELLIPTA) 62.5-25 MCG/ACT AEPB, Inhale 1 puff into the lungs daily., Disp: 60 each, Rfl: 11  Allergies  Allergen Reactions   Altace [Ramipril] Cough    Objective:   BP 130/72   Pulse 80   Temp 98.2 F (36.8 C)   Ht 5\' 8"  (1.727 m)   Wt 157 lb 12.8 oz (71.6 kg)   SpO2 98%   BMI 23.99 kg/m      10/12/2023    9:54 AM 09/27/2023    8:45 AM 09/20/2023    9:04 AM  Vitals with BMI  Height 5\' 8"  5\' 8"    Weight 157 lbs 13 oz 157 lbs 3 oz   BMI 24 23.91   Systolic 130 128 161  Diastolic 72 64 67  Pulse 80 72      Physical Exam Vitals and nursing note reviewed.  Constitutional:      Appearance: Normal appearance. He is normal weight.  HENT:     Head: Normocephalic and atraumatic.  Cardiovascular:     Rate and Rhythm: Normal rate and regular rhythm.     Pulses: Normal pulses.     Heart sounds: Normal heart sounds.  Pulmonary:     Effort: Pulmonary effort is normal.     Breath sounds: Normal breath sounds.  Skin:    General: Skin is warm and dry.     Capillary Refill: Capillary refill takes less than 2 seconds.  Neurological:      General: No focal deficit present.     Mental Status: He is alert and oriented to person, place, and time. Mental status is at baseline.  Psychiatric:        Mood and Affect: Mood normal.        Behavior: Behavior normal.        Thought Content: Thought content normal.        Judgment: Judgment normal.     Assessment & Plan:  Essential hypertension Assessment & Plan: 130/72 today in office. Continue Amlodipine 10mg  nightly, Losartan 100mg  daily, Aldactone 12.5mg  daily, and Metop Succinate 50mg  daily. Recommend heart healthy diet such as Mediterranean diet with whole grains, fruits, vegetable, fish, lean meats, nuts, and olive oil. Limit salt. Encouraged moderate walking, 3-5 times/week for 30-50 minutes each session. Aim for at least 150 minutes.week. Goal should be pace of 3 miles/hours, or walking 1.5 miles in 30 minutes. Avoid tobacco products. Avoid excess alcohol. Take medications as prescribed and bring medications and blood pressure log with cuff to each office visit. Seek medical care for chest pain, palpitations, shortness of breath with exertion, dizziness/lightheadedness, vision changes, recurrent headaches, or swelling of extremities. Follow up in 3 months or sooner if needed.      Follow up plan: Return in about 3 months (around 01/12/2024) for hypertension.  Park Meo, FNP

## 2023-10-12 NOTE — Assessment & Plan Note (Signed)
 130/72 today in office. Continue Amlodipine 10mg  nightly, Losartan 100mg  daily, Aldactone 12.5mg  daily, and Metop Succinate 50mg  daily. Recommend heart healthy diet such as Mediterranean diet with whole grains, fruits, vegetable, fish, lean meats, nuts, and olive oil. Limit salt. Encouraged moderate walking, 3-5 times/week for 30-50 minutes each session. Aim for at least 150 minutes.week. Goal should be pace of 3 miles/hours, or walking 1.5 miles in 30 minutes. Avoid tobacco products. Avoid excess alcohol. Take medications as prescribed and bring medications and blood pressure log with cuff to each office visit. Seek medical care for chest pain, palpitations, shortness of breath with exertion, dizziness/lightheadedness, vision changes, recurrent headaches, or swelling of extremities. Follow up in 3 months or sooner if needed.

## 2023-10-25 ENCOUNTER — Ambulatory Visit: Attending: Cardiology | Admitting: Cardiology

## 2023-10-25 ENCOUNTER — Encounter: Payer: Self-pay | Admitting: Cardiology

## 2023-10-25 VITALS — BP 136/56 | HR 76 | Ht 68.0 in | Wt 157.0 lb

## 2023-10-25 DIAGNOSIS — R55 Syncope and collapse: Secondary | ICD-10-CM

## 2023-10-25 DIAGNOSIS — I2510 Atherosclerotic heart disease of native coronary artery without angina pectoris: Secondary | ICD-10-CM | POA: Diagnosis not present

## 2023-10-25 MED ORDER — AMLODIPINE BESYLATE 5 MG PO TABS: 5.0000 mg | ORAL_TABLET | Freq: Every day | ORAL | 3 refills | Status: AC

## 2023-10-25 NOTE — Progress Notes (Signed)
 Clinical Summary [name] is a 78 y.o.male seen today for follow up of the following medical problems.   Previously followed by Dr Tresa Endo  This is a focused visit on recent ER visit with chest pain and also recent episodes of syncope   1.CAD - He is s/p CABG in 1998 with low-risk NST in 07/2013 - 01/2021 nuclear stress: mild apical inferior ischemia, low risk study - 12/2020 echo: LVEF 55-60%, no WMAs, grade Idd   - ER eval Jan 2023 with chest pain, thought to be GERD. Trops neg, EKG without acute ischemic changes. Symptoms improved on antacid   -08/2023 ER visit with chest pain - trop neg x 2, EKG sinus tach 104 RBBB CXR no acute process. CT PE no PE. BNP 30 - no reoccurence.  - compliant with meds   2.Syncope - - admission 12/2020 with syncope - thought to be due to hypovolemia/dehydration, was hypotesiven SBPs to 60s resolved with IVFs. Aldactone was stopped at that time.    - recent issues with recurrent syncope.  - when first stands up can feel lightheaded. At time symptoms go away, other times can develop severe urge to have a bowel movement with abdominal cramping.  - gets very diaphoretic, feels like he is going to pass out.   -most recent syncope last Thursday. Was at home, cramping feeling and went to commode. Dizziness while on commode but was able to get back to bed. Went and layed on the bed. Got up to go back to have another BM, syncope on way to bathroom. Out 2-3 minutes, came to and went to have a BM. Wife checked HR and low 50s - Prior episode 2 weeks ago. Similar scenario with strong urge to have a bowel movement.  - drinks 2-3 botterls of water daily. History of prior esophageal cancer and surgery, sometimes hard to keep fluids down.  - has baclofen on his list but has not been taking.     Other medical issues not addressed this visit       2. Palpitations - episode 1 week ago felt like heart was racing.  - woke him up from sleep -episode last  15-20 minutes.  - no recurrent episodes.  - coffee x 2 cups, no sodas, rare tea, no energy drinks, no EtOH   - denies significant palpitations.    3, Dizziness - per his report had low bp's with standing. - ongonig symptoms. Episode just yesterday. Was climbing a ladder. Suddenly felt lightheaded, nauseous. Sat down and rested.  - drinks 2-3 water bottles a day, 2 cups of coffee. Has prn lasix has not taken in months. Seldomly takes muscle releaxer, takes gabapentin few times a week.     - no recent symptoms.   4. Syncope - admission 12/2020 with syncope - thought to be due to hypovolemia/dehydration, was hypotesiven SBPs to 60s resolved with IVFs. Aldactone was stopped at that time.    - no recent symptoms  - dizziness comes on with standing, though occasional with sitting - when first stands up can feel lightheaded -later gets a stomach cramping, like he needs to have a bowel movement - gets very diaphoretic, feels like he is going to pass out.  - checkd HR down 54  -most recent syncope last Thursday. Was at home, cramping feeling and went to commode. Dizziness while on commode. Went and layed on the bed. Got up to go back to have a BM, syncope on way to  bathroom. Out 2-3 minutes, came to and went to have a BM.   Prior episode 2 weeks ago. Similar episdoe - drinks 2-3 botterls of water daily - has not taken baclofen.  - history of esophageal       5. PAD - He is s/p fem-pop bypass in 1993 and known occluded right SFA.  - prior bilateral common iliac artery stenting, bilateral external iliac artery stenting. - 03/2021 right iliofemoral endarectomy - 04/2021 left femoral below knee pop bypass - 08/2022 angioplasty right external iliac artery stent, covered stenting right exerternal iliac   - on extended DAPT managed by vascular   6. HTN - compliant with meds   - home bp's 120s/60s, compliant with meds - has been on prednisone recently     7. Hyperliidemia - 10/2020 TC  90 TG 94 HDL 41 LDL 31 - Jan 2024 TC 123 TG 148 HDL 43 LDL 55 - 02/2023 TC 409 TG 811 HDL 38 LDL 46   8. Severe COPD - followed by pulmionary - mixed compliance with inhalers   - recent cough, SOB, wheezing - has pcp appt today   8. History of esopaghael cancer     9. GERD - followed by GI Past Medical History:  Diagnosis Date   Adenomatous colon polyp 03/2009   Last colonoscopy by Dr. Jena Gauss    Adenomatous polyp 2010   Adenomatous polyp of colon 11/03/2010   Arthritis    Barrett's esophagus    CAD (coronary artery disease)    Cataract    Complication of anesthesia    has a shortened esophogus due to CA.    COPD (chronic obstructive pulmonary disease) (HCC)    Severe emphysema per CT   Diverticulosis    Emphysema of lung (HCC)    Esophageal carcinoma (HCC) 03/2009   T1N1M0   GERD (gastroesophageal reflux disease)    Glaucoma    Heart murmur    History of Doppler ultrasound 11/09/2011   03/2014- 50-69% L ICA stenosis;carotid doppler; L bulb/prox ICA 0-49% diameter reduction; L vertebral artery - occlusive ds; L ECA  demonstrates severe amount of fibrous plaque   History of Doppler ultrasound 11/09/2011   LEAs; R ABI - mod art. insuff.; L ABI normal at rest; R SFA - occlusive ds; L SFA - occlusive ds; patent fem-pop graft   History of echocardiogram 08/27/2009   EF >55%   History of hiatal hernia    History of kidney stones 1965   History of nuclear stress test 11/24/2011   lexiscan; normal perfusion; low risk scan; non-diagnostic for ischemia   Hyperlipidemia    Hypertension    Left carotid artery stenosis 04/08/2014   Pneumonia    Pre-diabetes    Pulmonary nodule, right 04/08/2014   2.8 mm-incidental finding on CT   PVD (peripheral vascular disease) (HCC)    Tachyarrhythmia 1999   Status post ablation at [facility]   Tobacco abuse      Allergies  Allergen Reactions   Altace [Ramipril] Cough     Current Outpatient Medications  Medication Sig Dispense Refill    acetaminophen (TYLENOL) 500 MG tablet Take 1,000 mg by mouth daily as needed for headache.     albuterol (PROVENTIL) (2.5 MG/3ML) 0.083% nebulizer solution USE ONE VIAL (2.5MG  TOTAL) IN NEBULIZER EVERY SIX HOURS AS NEEDED FOR WHEEZING OR SHORTNESS OF BREATH 75 mL 6   albuterol (VENTOLIN HFA) 108 (90 Base) MCG/ACT inhaler Inhale 2 puffs into the lungs every 4 (four) hours as  needed for wheezing or shortness of breath. 1 each 0   ALPRAZolam (XANAX) 0.5 MG tablet Take 1 tablet (0.5 mg total) by mouth at bedtime. 30 tablet 0   amLODipine (NORVASC) 10 MG tablet TAKE ONE (1) TABLET BY MOUTH EVERY DAY 90 tablet 1   baclofen (LIORESAL) 10 MG tablet Take 1 tablet (10 mg total) by mouth 3 (three) times daily as needed for muscle spasms. 20 each 0   brimonidine (ALPHAGAN) 0.2 % ophthalmic solution SMARTSIG:In Eye(s)     clopidogrel (PLAVIX) 75 MG tablet TAKE ONE (1) TABLET BY MOUTH EVERY DAY 90 tablet 1   dorzolamide-timolol (COSOPT) 2-0.5 % ophthalmic solution Place 1 drop into both eyes 2 (two) times daily.     gabapentin (NEURONTIN) 100 MG capsule Take 1 capsule (100 mg total) by mouth 2 (two) times daily AND 2 capsules (200 mg total) at bedtime. 360 capsule 1   lansoprazole (PREVACID) 30 MG capsule Take 1 capsule (30 mg total) by mouth daily before supper. 90 capsule 3   latanoprost (XALATAN) 0.005 % ophthalmic solution SMARTSIG:In Eye(s)     losartan (COZAAR) 100 MG tablet Take 1 tablet (100 mg total) by mouth at bedtime. 90 tablet 3   magic mouthwash (nystatin, diphenhydrAMINE, alum & mag hydroxide) suspension mixture Swish and spit 5 mLs 3 (three) times daily as needed for mouth pain. 240 mL 0   metoprolol succinate (TOPROL-XL) 50 MG 24 hr tablet Take 1 tablet (50 mg total) by mouth daily. Take with or immediately following a meal. 90 tablet 1   montelukast (SINGULAIR) 10 MG tablet Take 1 tablet (10 mg total) by mouth daily. 90 tablet 3   Multiple Vitamins-Minerals (CENTRUM SILVER PO) Take 1 tablet by  mouth daily.     nitroGLYCERIN (NITROSTAT) 0.4 MG SL tablet Place 1 tablet (0.4 mg total) under the tongue every 5 (five) minutes as needed for chest pain. 25 tablet 0   rosuvastatin (CRESTOR) 10 MG tablet Take 1 tablet (10 mg total) by mouth daily. 90 tablet 1   spironolactone (ALDACTONE) 25 MG tablet Take 0.5 tablets (12.5 mg total) by mouth daily. 45 tablet 3   sucralfate (CARAFATE) 1 g tablet Take 1 tablet (1 g total) by mouth 4 (four) times daily -  with meals and at bedtime. As needed for esophageal pain, heartburn. You can crush in couple of tablespoons of water to make suspension. 120 tablet 5   traZODone (DESYREL) 50 MG tablet Take 0.5-1 tablets (25-50 mg total) by mouth at bedtime as needed for sleep. 30 tablet 3   umeclidinium-vilanterol (ANORO ELLIPTA) 62.5-25 MCG/ACT AEPB Inhale 1 puff into the lungs daily. 60 each 11   No current facility-administered medications for this visit.     Past Surgical History:  Procedure Laterality Date   ABDOMINAL AORTOGRAM W/LOWER EXTREMITY N/A 04/01/2021   Procedure: ABDOMINAL AORTOGRAM W/LOWER EXTREMITY;  Surgeon: Victorino Sparrow, MD;  Location: [facility] INVASIVE CV LAB;  Service: Cardiovascular;  Laterality: N/A;   ABDOMINAL AORTOGRAM W/LOWER EXTREMITY N/A 09/01/2022   Procedure: ABDOMINAL AORTOGRAM W/LOWER EXTREMITY;  Surgeon: Victorino Sparrow, MD;  Location: [facility] INVASIVE CV LAB;  Service: Cardiovascular;  Laterality: N/A;   BIOPSY  10/24/2017   Procedure: BIOPSY;  Surgeon: Corbin Ade, MD;  Location: [facility];  Service: Endoscopy;;  esophagus   BIOPSY  10/22/2021   Procedure: BIOPSY;  Surgeon: Corbin Ade, MD;  Location: [facility];  Service: Endoscopy;;   CATARACT EXTRACTION Bilateral    COLONOSCOPY  03/17/2009   Dr.Rourk- normal rectum, sigmoid diverticula, some pale sigmoid mucosa with diffuse petechiae. pedunculated polyp at the splenic flexure, remainder of colonic mucosa appeared normal. bx= adenomatous polyp   COLONOSCOPY   04/19/2003   Dr.Rehman- few diverticu;a at the sigmoid colon, 3 small polyps, one at the transverse colon and 2 at the sigmoid, small external hemorrhoids. bx report not available.    COLONOSCOPY WITH PROPOFOL N/A 10/24/2017   Dr. Jena Gauss: Diverticulosis, 11 mm polyp at the ileocecal valve, tubular adenoma.  Repeat colonoscopy in 3 years   COLONOSCOPY WITH PROPOFOL N/A 10/22/2021   Procedure: COLONOSCOPY WITH PROPOFOL;  Surgeon: Corbin Ade, MD;  Location: [facility];  Service: Endoscopy;  Laterality: N/A;  11:00am   CORONARY ARTERY BYPASS GRAFT  1998   [name]   ENDARTERECTOMY FEMORAL Right 04/13/2021   Procedure: RIGHT ILIOFEMORAL ARTERY ENDARTERECTOMY WITH PATCH ANGIOPLASTY USING HEMASHIELD PALTINUM FINESSE DACRON PATCH;  Surgeon: Victorino Sparrow, MD;  Location: [facility] OR;  Service: Vascular;  Laterality: Right;   ESOPHAGECTOMY  2010   [facility] Dr. [name]   ESOPHAGOGASTRODUODENOSCOPY  11/25/2010   Dr.Rourk- s/p esophagectomy with gastric pull-up, esophageal erosions straddling the surgical anastomosis, salmon colored epithelium coming up a good centimeter to a centimeter and a half above the suture line, islands of salmon colored epithelium in the most poximal residual esophagus, remainder of gastric mucosa appeared normal. bx= swamous &gastric glandular mucosa w/chronic active inflammation   ESOPHAGOGASTRODUODENOSCOPY  03/17/2009   Dr.Rourk- 4cm segment of salmon-colored epithlium distal esophagus suspicious for barretts esophagus. area of suspicious nodularity w/in this segment bx seperately. small to moderate size hiatal hernia, o/w normal stomach D1 and D2 bx=adenocarcinoma   ESOPHAGOGASTRODUODENOSCOPY (EGD) WITH PROPOFOL N/A 10/24/2017   Dr. Jena Gauss: Remnant of esophagus with anastomosis with stomach at 24 cm from the incisors, 2 cm above the anastomosis he was found to have Barrett's but no dysplasia.  Advised for repeat EGD in 3 years   ESOPHAGOGASTRODUODENOSCOPY (EGD) WITH PROPOFOL  N/A 10/22/2021   Procedure: ESOPHAGOGASTRODUODENOSCOPY (EGD) WITH PROPOFOL;  Surgeon: Corbin Ade, MD;  Location: [facility];  Service: Endoscopy;  Laterality: N/A;   EYE SURGERY     cataract   FEMORAL-POPLITEAL BYPASS GRAFT  1993   occlusive ds in R SFA   FEMORAL-POPLITEAL BYPASS GRAFT Left 05/11/2021   Procedure: LEFT FEMORAL-BELOW KNEE POPLITEAL ARTERY BYPASS GRAFT WITH 6 mm PTFE and COMPLETION ANGIOGRAM;  Surgeon: Victorino Sparrow, MD;  Location: [facility] OR;  Service: Vascular;  Laterality: Left;   GASTRIC PULL THROUGH  2010   With esophagectomy   INSERTION OF ILIAC STENT Bilateral 04/13/2021   Procedure: INSERTION OF BILATERAL COMMON ILIAC KISSING STENTS AND INSERION OF RIGHT EXTERNAL ILIAC STENT;  Surgeon: Victorino Sparrow, MD;  Location: [facility] OR;  Service: Vascular;  Laterality: Bilateral;   JOINT REPLACEMENT     LIPOMA EXCISION  2010   LOWER EXTREMITY ANGIOGRAM Left 04/13/2021   Procedure: LEFT LOWER EXTREMITY ANGIOGRAM ;  Surgeon: Victorino Sparrow, MD;  Location: [facility] OR;  Service: Vascular;  Laterality: Left;   PERIPHERAL VASCULAR INTERVENTION  04/01/2021   Procedure: PERIPHERAL VASCULAR INTERVENTION;  Surgeon: Victorino Sparrow, MD;  Location: [facility] INVASIVE CV LAB;  Service: Cardiovascular;;   PERIPHERAL VASCULAR INTERVENTION Right 09/01/2022   Procedure: PERIPHERAL VASCULAR INTERVENTION;  Surgeon: Victorino Sparrow, MD;  Location: [facility] INVASIVE CV LAB;  Service: Cardiovascular;  Laterality: Right;  right external iliac   POLYPECTOMY  10/24/2017   Procedure: POLYPECTOMY;  Surgeon:  Corbin Ade, MD;  [facility];  Service: Endoscopy;;  colon    POLYPECTOMY  10/22/2021   Procedure: POLYPECTOMY;  Surgeon: Corbin Ade, MD;  Location: [facility];  Service: Endoscopy;;   TOTAL HIP ARTHROPLASTY Left 01/30/2019   Procedure: TOTAL HIP ARTHROPLASTY ANTERIOR APPROACH;  Surgeon: Sheral Apley, MD;  Location: [facility] ORS;  Service: Orthopedics;  Laterality: Left;   ULTRASOUND GUIDANCE  FOR VASCULAR ACCESS Left 04/13/2021   Procedure: ULTRASOUND GUIDANCE FOR VASCULAR ACCESS, left femoral;  Surgeon: Victorino Sparrow, MD;  Location: [facility] OR;  Service: Vascular;  Laterality: Left;     Allergies  Allergen Reactions   Altace [Ramipril] Cough      Family History  Problem Relation Age of Onset   GER disease Mother    Coronary artery disease Brother    Congenital heart disease Sister    Colon cancer Neg Hx      Social History Mr. Szafranski reports that he quit smoking about 28 years ago. His smoking use included cigarettes. He started smoking about 68 years ago. He has a 80 pack-year smoking history. He has never been exposed to tobacco smoke. He has never used smokeless tobacco. Mr. Joslyn reports no history of alcohol use.     Physical Examination Today's Vitals   10/25/23 1547  BP: (!) 136/56  Pulse: 76  SpO2: 96%  Weight: 157 lb (71.2 kg)  Height: 5\' 8"  (1.727 m)   Body mass index is 23.87 kg/m.  Gen: resting comfortably, no acute distress HEENT: no scleral icterus, pupils equal round and reactive, no palptable cervical adenopathy,  CV: RRR, no m/rg, no jvd Resp: Clear to auscultation bilaterally GI: abdomen is soft, non-tender, non-distended, normal bowel sounds, no hepatosplenomegaly MSK: extremities are warm, no edema.  Skin: warm, no rash Neuro:  no focal deficits Psych: appropriate affect   Diagnostic Studies 01/2021 nuclear stress The left ventricular ejection fraction is normal (55-65%). Nuclear stress EF: 65%. There was no ST segment deviation noted during stress. No T wave inversion was noted during stress. Defect 1: There is a small defect of mild severity present in the apical inferior location. Findings consistent with a small region of ischemia. This is a low risk study.    Assessment and Plan   CAD  - 01/2021 nuclear stress was low risk, though very small area of ischemia - ER visit in 08/2023 with chest pain with benign workup, no  significnat recurrence - continue to monitor  2. Syncope - symptoms consistent with vasovagal syncope. Strong urge to defecate, diaphoretic, leading to syncope - encouraged increased oral hydration high electrolyte fluids and increased sodium intake. Given Rx for compression stockings. Lower norvasc to 5mg  daily, accept higher bp's.  - discussed positioning to take at onset of symptoms - no indication for additional cardiac testing, classic vasovagal symptoms.    Antoine Poche, M.D.

## 2023-10-25 NOTE — Patient Instructions (Signed)
 Medication Instructions:  Your physician has recommended you make the following change in your medication:   -Decrease Amlodipine to 5 mg once daily   *If you need a refill on your cardiac medications before your next appointment, please call your pharmacy*  Lab Work: None If you have labs (blood work) drawn today and your tests are completely normal, you will receive your results only by: MyChart Message (if you have MyChart) OR A paper copy in the mail If you have any lab test that is abnormal or we need to change your treatment, we will call you to review the results.  Testing/Procedures: None  Follow-Up: At [facility], you and your health needs are our priority.  As part of our continuing mission to provide you with exceptional heart care, our providers are all part of one team.  This team includes your primary Cardiologist (physician) and Advanced Practice Providers or APPs (Physician Assistants and Nurse Practitioners) who all work together to provide you with the care you need, when you need it.  Your next appointment:   2 month(s)  Provider:   You may see Dina Rich, MD or one of the following Advanced Practice Providers on your designated Care Team:   Randall An, PA-C  [place], [place] Jacolyn Reedy, [place]     We recommend signing up for the patient portal called "MyChart".  Sign up information is provided on this After Visit Summary.  MyChart is used to connect with patients for Virtual Visits (Telemedicine).  Patients are able to view lab/test results, encounter notes, upcoming appointments, etc.  Non-urgent messages can be sent to your provider as well.   To learn more about what you can do with MyChart, go to [email or web address].   Other Instructions

## 2023-10-26 ENCOUNTER — Encounter: Payer: Self-pay | Admitting: Internal Medicine

## 2023-11-07 ENCOUNTER — Other Ambulatory Visit: Payer: Self-pay | Admitting: Cardiology

## 2023-11-09 ENCOUNTER — Ambulatory Visit: Payer: Medicare Other | Admitting: Student

## 2023-11-10 ENCOUNTER — Encounter (HOSPITAL_COMMUNITY): Payer: Medicare Other

## 2023-11-10 ENCOUNTER — Ambulatory Visit: Payer: Medicare Other | Admitting: Vascular Surgery

## 2023-11-15 ENCOUNTER — Ambulatory Visit (INDEPENDENT_AMBULATORY_CARE_PROVIDER_SITE_OTHER): Admitting: Family Medicine

## 2023-11-15 ENCOUNTER — Encounter: Payer: Self-pay | Admitting: Family Medicine

## 2023-11-15 VITALS — BP 120/82 | HR 82 | Ht 68.0 in | Wt 158.0 lb

## 2023-11-15 DIAGNOSIS — R519 Headache, unspecified: Secondary | ICD-10-CM | POA: Diagnosis not present

## 2023-11-15 DIAGNOSIS — R42 Dizziness and giddiness: Secondary | ICD-10-CM

## 2023-11-15 NOTE — Progress Notes (Signed)
 Subjective:  HPI: Gregory Eaton is a 78 y.o. male presenting on 11/15/2023 for Dizziness (Been off and off since end of February ) and Headache   Dizziness Associated symptoms include headaches.  Headache  Associated symptoms include dizziness.   Patient is in today for lightheadedness and dizzy spells with history of syncope. Reports he feels fine when he wakes and can go for a walk and start staggering with an associated occipital headache. He places emphasis on this headache, reports he usually takes Tylenol  for relief. He reports the dizziness gets so bad he feels like he needs to have a bowel movement. Describes dizziness as "a cloud moving around in his head". Denies rocking, tilting, spinning or vision disturbances. Feels like it is in his head, worse when he puts his head down and then raises back up. Also endorses feeling that he is walking sideways when walking to his mailbox. Denies chest pain, palpitations. Has had 3 syncope episodes last 3 months. Has been evaluated by cardiology who deemed this classic vasovagal, recommended decrease in Amlodipine  and permissive hypertension, pushing fluids, and compression stockings.  Has had prior long term heart monitor in 2023 that was normal.  Carotid dopplers 2024 showed left ICA stenosis 40-59%, minimal right ICA stenosis CT head negative 08/2023 showed chronic microvascular ischemia  Orthostatic vitals today: Lying 138/76 HR 82 Sitting 120/82 HR 81 Standing 120/80 HR 82  Review of Systems  Neurological:  Positive for dizziness and headaches.  All other systems reviewed and are negative.   Relevant past medical history reviewed and updated as indicated.   Past Medical History:  Diagnosis Date   Adenomatous colon polyp 03/2009   Last colonoscopy by Dr. Riley Cheadle    Adenomatous polyp 2010   Adenomatous polyp of colon 11/03/2010   Arthritis    Barrett's esophagus    CAD (coronary artery disease)    Cataract    Complication of  anesthesia    has a shortened esophogus due to CA.    COPD (chronic obstructive pulmonary disease) (HCC)    Severe emphysema per CT   Diverticulosis    Emphysema of lung (HCC)    Esophageal carcinoma (HCC) 03/2009   T1N1M0   GERD (gastroesophageal reflux disease)    Glaucoma    Heart murmur    History of Doppler ultrasound 11/09/2011   03/2014- 50-69% L ICA stenosis;carotid doppler; L bulb/prox ICA 0-49% diameter reduction; L vertebral artery - occlusive ds; L ECA  demonstrates severe amount of fibrous plaque   History of Doppler ultrasound 11/09/2011   LEAs; R ABI - mod art. insuff.; L ABI normal at rest; R SFA - occlusive ds; L SFA - occlusive ds; patent fem-pop graft   History of echocardiogram 08/27/2009   EF >55%   History of hiatal hernia    History of kidney stones 1965   History of nuclear stress test 11/24/2011   lexiscan ; normal perfusion; low risk scan; non-diagnostic for ischemia   Hyperlipidemia    Hypertension    Left carotid artery stenosis 04/08/2014   Pneumonia    Pre-diabetes    Pulmonary nodule, right 04/08/2014   2.8 mm-incidental finding on CT   PVD (peripheral vascular disease) (HCC)    Tachyarrhythmia 1999   Status post ablation at [facility]   Tobacco abuse      Past Surgical History:  Procedure Laterality Date   ABDOMINAL AORTOGRAM W/LOWER EXTREMITY N/A 04/01/2021   Procedure: ABDOMINAL AORTOGRAM W/LOWER EXTREMITY;  Surgeon: Kayla Part, MD;  Location: [facility] INVASIVE CV LAB;  Service: Cardiovascular;  Laterality: N/A;   ABDOMINAL AORTOGRAM W/LOWER EXTREMITY N/A 09/01/2022   Procedure: ABDOMINAL AORTOGRAM W/LOWER EXTREMITY;  Surgeon: Kayla Part, MD;  Location: [facility] INVASIVE CV LAB;  Service: Cardiovascular;  Laterality: N/A;   BIOPSY  10/24/2017   Procedure: BIOPSY;  Surgeon: Suzette Espy, MD;  Location: [facility];  Service: Endoscopy;;  esophagus   BIOPSY  10/22/2021   Procedure: BIOPSY;  Surgeon: Suzette Espy, MD;  Location: [facility];   Service: Endoscopy;;   CATARACT EXTRACTION Bilateral    COLONOSCOPY  03/17/2009   Dr.Rourk- normal rectum, sigmoid diverticula, some pale sigmoid mucosa with diffuse petechiae. pedunculated polyp at the splenic flexure, remainder of colonic mucosa appeared normal. bx= adenomatous polyp   COLONOSCOPY  04/19/2003   Dr.Rehman- few diverticu;a at the sigmoid colon, 3 small polyps, one at the transverse colon and 2 at the sigmoid, small external hemorrhoids. bx report not available.    COLONOSCOPY WITH PROPOFOL  N/A 10/24/2017   Dr. Riley Cheadle: Diverticulosis, 11 mm polyp at the ileocecal valve, tubular adenoma.  Repeat colonoscopy in 3 years   COLONOSCOPY WITH PROPOFOL  N/A 10/22/2021   Procedure: COLONOSCOPY WITH PROPOFOL ;  Surgeon: Suzette Espy, MD;  Location: [facility];  Service: Endoscopy;  Laterality: N/A;  11:00am   CORONARY ARTERY BYPASS GRAFT  1998   [name]   ENDARTERECTOMY FEMORAL Right 04/13/2021   Procedure: RIGHT ILIOFEMORAL ARTERY ENDARTERECTOMY WITH PATCH ANGIOPLASTY USING HEMASHIELD PALTINUM FINESSE DACRON PATCH;  Surgeon: Kayla Part, MD;  Location: [facility] OR;  Service: Vascular;  Laterality: Right;   ESOPHAGECTOMY  2010   [facility] Dr. Annella Barrows   ESOPHAGOGASTRODUODENOSCOPY  11/25/2010   Dr.Rourk- s/p esophagectomy with gastric pull-up, esophageal erosions straddling the surgical anastomosis, salmon colored epithelium coming up a good centimeter to a centimeter and a half above the suture line, islands of salmon colored epithelium in the most poximal residual esophagus, remainder of gastric mucosa appeared normal. bx= swamous &gastric glandular mucosa w/chronic active inflammation   ESOPHAGOGASTRODUODENOSCOPY  03/17/2009   Dr.Rourk- 4cm segment of salmon-colored epithlium distal esophagus suspicious for barretts esophagus. area of suspicious nodularity w/in this segment bx seperately. small to moderate size hiatal hernia, o/w normal stomach D1 and D2 bx=adenocarcinoma    ESOPHAGOGASTRODUODENOSCOPY (EGD) WITH PROPOFOL  N/A 10/24/2017   Dr. Riley Cheadle: Remnant of esophagus with anastomosis with stomach at 24 cm from the incisors, 2 cm above the anastomosis he was found to have Barrett's but no dysplasia.  Advised for repeat EGD in 3 years   ESOPHAGOGASTRODUODENOSCOPY (EGD) WITH PROPOFOL  N/A 10/22/2021   Procedure: ESOPHAGOGASTRODUODENOSCOPY (EGD) WITH PROPOFOL ;  Surgeon: Suzette Espy, MD;  [facility]: [facility];  Service: Endoscopy;  Laterality: N/A;   EYE SURGERY     cataract   FEMORAL-POPLITEAL BYPASS GRAFT  1993   occlusive ds in R SFA   FEMORAL-POPLITEAL BYPASS GRAFT Left 05/11/2021   Procedure: LEFT FEMORAL-BELOW KNEE POPLITEAL ARTERY BYPASS GRAFT WITH 6 mm PTFE and COMPLETION ANGIOGRAM;  Surgeon: Kayla Part, MD;  Location: [facility] OR;  Service: Vascular;  Laterality: Left;   GASTRIC PULL THROUGH  2010   With esophagectomy   INSERTION OF ILIAC STENT Bilateral 04/13/2021   Procedure: INSERTION OF BILATERAL COMMON ILIAC KISSING STENTS AND INSERION OF RIGHT EXTERNAL ILIAC STENT;  Surgeon: Kayla Part, MD;  Location: [facility] OR;  Service: Vascular;  Laterality: Bilateral;   JOINT REPLACEMENT     LIPOMA EXCISION  2010   LOWER  EXTREMITY ANGIOGRAM Left 04/13/2021   Procedure: LEFT LOWER EXTREMITY ANGIOGRAM ;  Surgeon: Kayla Part, MD;  Location: [facility] OR;  Service: Vascular;  Laterality: Left;   PERIPHERAL VASCULAR INTERVENTION  04/01/2021   Procedure: PERIPHERAL VASCULAR INTERVENTION;  Surgeon: Kayla Part, MD;  Location: [facility] INVASIVE CV LAB;  Service: Cardiovascular;;   PERIPHERAL VASCULAR INTERVENTION Right 09/01/2022   Procedure: PERIPHERAL VASCULAR INTERVENTION;  Surgeon: Kayla Part, MD;  Location: [facility] INVASIVE CV LAB;  Service: Cardiovascular;  Laterality: Right;  right external iliac   POLYPECTOMY  10/24/2017   Procedure: POLYPECTOMY;  Surgeon: Suzette Espy, MD;  [facility]: [facility];  Service: Endoscopy;;  colon    POLYPECTOMY  10/22/2021    Procedure: POLYPECTOMY;  Surgeon: Suzette Espy, MD;  Location: [facility];  Service: Endoscopy;;   TOTAL HIP ARTHROPLASTY Left 01/30/2019   Procedure: TOTAL HIP ARTHROPLASTY ANTERIOR APPROACH;  Surgeon: Saundra Curl, MD;  Location: [facility] ORS;  Service: Orthopedics;  Laterality: Left;   ULTRASOUND GUIDANCE FOR VASCULAR ACCESS Left 04/13/2021   Procedure: ULTRASOUND GUIDANCE FOR VASCULAR ACCESS, left femoral;  Surgeon: Kayla Part, MD;  Location: [facility] OR;  Service: Vascular;  Laterality: Left;    Allergies and medications reviewed and updated.   Current Outpatient Medications:    acetaminophen  (TYLENOL ) 500 MG tablet, Take 1,000 mg by mouth daily as needed for headache., Disp: , Rfl:    albuterol  (PROVENTIL ) (2.5 MG/3ML) 0.083% nebulizer solution, USE ONE VIAL (2.5MG  TOTAL) IN NEBULIZER EVERY SIX HOURS AS NEEDED FOR WHEEZING OR SHORTNESS OF BREATH, Disp: 75 mL, Rfl: 6   albuterol  (VENTOLIN  HFA) 108 (90 Base) MCG/ACT inhaler, Inhale 2 puffs into the lungs every 4 (four) hours as needed for wheezing or shortness of breath., Disp: 1 each, Rfl: 0   amLODipine  (NORVASC ) 5 MG tablet, Take 1 tablet (5 mg total) by mouth daily., Disp: 180 tablet, Rfl: 3   baclofen  (LIORESAL ) 10 MG tablet, Take 1 tablet (10 mg total) by mouth 3 (three) times daily as needed for muscle spasms., Disp: 20 each, Rfl: 0   brimonidine  (ALPHAGAN ) 0.2 % ophthalmic solution, SMARTSIG:In Eye(s), Disp: , Rfl:    clopidogrel  (PLAVIX ) 75 MG tablet, TAKE ONE (1) TABLET BY MOUTH EVERY DAY, Disp: 90 tablet, Rfl: 1   dorzolamide-timolol  (COSOPT) 2-0.5 % ophthalmic solution, Place 1 drop into both eyes 2 (two) times daily., Disp: , Rfl:    gabapentin  (NEURONTIN ) 100 MG capsule, Take 1 capsule (100 mg total) by mouth 2 (two) times daily AND 2 capsules (200 mg total) at bedtime., Disp: 360 capsule, Rfl: 1   lansoprazole  (PREVACID ) 30 MG capsule, Take 1 capsule (30 mg total) by mouth daily before supper., Disp: 90 capsule, Rfl: 3    latanoprost  (XALATAN ) 0.005 % ophthalmic solution, SMARTSIG:In Eye(s), Disp: , Rfl:    losartan  (COZAAR ) 100 MG tablet, Take 1 tablet (100 mg total) by mouth at bedtime., Disp: 90 tablet, Rfl: 3   magic mouthwash (nystatin , diphenhydrAMINE , alum & mag hydroxide) suspension mixture, Swish and spit 5 mLs 3 (three) times daily as needed for mouth pain., Disp: 240 mL, Rfl: 0   metoprolol  succinate (TOPROL -XL) 50 MG 24 hr tablet, Take 1 tablet (50 mg total) by mouth daily. Take with or immediately following a meal., Disp: 90 tablet, Rfl: 1   montelukast  (SINGULAIR ) 10 MG tablet, Take 1 tablet (10 mg total) by mouth daily., Disp: 90 tablet, Rfl: 3   Multiple Vitamins-Minerals (CENTRUM SILVER PO), Take 1 tablet by  mouth daily., Disp: , Rfl:    nitroGLYCERIN  (NITROSTAT ) 0.4 MG SL tablet, Place 1 tablet (0.4 mg total) under the tongue every 5 (five) minutes as needed for chest pain., Disp: 25 tablet, Rfl: 0   rosuvastatin  (CRESTOR ) 10 MG tablet, Take 1 tablet (10 mg total) by mouth daily., Disp: 90 tablet, Rfl: 1   spironolactone  (ALDACTONE ) 25 MG tablet, TAKE 0.5 TABLETS (12.5 MG TOTATL) BY MOUTH DAILY, Disp: 45 tablet, Rfl: 3   sucralfate  (CARAFATE ) 1 g tablet, Take 1 tablet (1 g total) by mouth 4 (four) times daily -  with meals and at bedtime. As needed for esophageal pain, heartburn. You can crush in couple of tablespoons of water  to make suspension., Disp: 120 tablet, Rfl: 5   traZODone  (DESYREL ) 50 MG tablet, Take 0.5-1 tablets (25-50 mg total) by mouth at bedtime as needed for sleep., Disp: 30 tablet, Rfl: 3   umeclidinium-vilanterol (ANORO ELLIPTA ) 62.5-25 MCG/ACT AEPB, Inhale 1 puff into the lungs daily., Disp: 60 each, Rfl: 11   ALPRAZolam  (XANAX ) 0.5 MG tablet, Take 1 tablet (0.5 mg total) by mouth at bedtime., Disp: 30 tablet, Rfl: 0  Allergies  Allergen Reactions   Altace [Ramipril] Cough    Objective:   BP 120/82 (BP Location: Right Arm, Patient Position: Lying left side, Cuff Size:  Normal)   Pulse 82   Ht 5\' 8"  (1.727 m)   Wt 158 lb (71.7 kg)   SpO2 94%   BMI 24.02 kg/m      11/15/2023    8:08 AM 11/15/2023    8:00 AM 10/25/2023    3:47 PM  Vitals with BMI  Height  5\' 8"  5\' 8"   Weight  158 lbs 157 lbs  BMI  24.03 23.88  Systolic 120  136  Diastolic 82  56  Pulse  82 76   Orthostatic VS for the past 72 hrs (Last 3 readings):  Orthostatic BP Patient Position BP Location Cuff Size Orthostatic Pulse  11/15/23 0808 138/76 Lying left side Right Arm Normal 82     Physical Exam Vitals and nursing note reviewed.  Constitutional:      Appearance: Normal appearance. He is normal weight.  HENT:     Head: Normocephalic and atraumatic.     Right Ear: Tympanic membrane, ear canal and external ear normal.     Left Ear: Tympanic membrane, ear canal and external ear normal.  Eyes:     Extraocular Movements: Extraocular movements intact.     Conjunctiva/sclera: Conjunctivae normal.     Pupils: Pupils are equal, round, and reactive to light.  Cardiovascular:     Rate and Rhythm: Normal rate and regular rhythm.     Pulses: Normal pulses.     Heart sounds: Normal heart sounds.  Pulmonary:     Effort: Pulmonary effort is normal.     Breath sounds: Normal breath sounds.  Skin:    General: Skin is warm and dry.     Capillary Refill: Capillary refill takes less than 2 seconds.  Neurological:     General: No focal deficit present.     Mental Status: He is alert and oriented to person, place, and time. Mental status is at baseline.     GCS: GCS eye subscore is 4. GCS verbal subscore is 5. GCS motor subscore is 6.     Sensory: Sensation is intact.     Motor: Motor function is intact.     Coordination: Coordination is intact.     Gait: Gait  is intact.  Psychiatric:        Mood and Affect: Mood normal.        Behavior: Behavior normal.        Thought Content: Thought content normal.        Judgment: Judgment normal.     Assessment & Plan:  Dizziness  Occipital  headache Assessment & Plan: Pt with recent history of several months of syncope x3 with occipital headache, "dizziness" and imbalance. Has been evaluated by cardiology and presumed vasovagal with recommendations for compression stockings, hydration, and lowering amlodipine . In setting of his headaches and imbalance that have persisted will refer to neurology for further workup.   long term heart monitor in 2023 that was normal.  Carotid dopplers 2024 showed left ICA stenosis 40-59%, minimal right ICA stenosis CT head negative 08/2023 showed chronic microvascular ischemia      Follow up plan: Return if symptoms worsen or fail to improve.  Jenelle Mis, FNP

## 2023-11-15 NOTE — Assessment & Plan Note (Signed)
 Pt with recent history of several months of syncope x3 with occipital headache, "dizziness" and imbalance. Has been evaluated by cardiology and presumed vasovagal with recommendations for compression stockings, hydration, and lowering amlodipine . In setting of his headaches and imbalance that have persisted will refer to neurology for further workup.   long term heart monitor in 2023 that was normal.  Carotid dopplers 2024 showed left ICA stenosis 40-59%, minimal right ICA stenosis CT head negative 08/2023 showed chronic microvascular ischemia

## 2023-11-22 ENCOUNTER — Ambulatory Visit: Admitting: Gastroenterology

## 2023-11-22 ENCOUNTER — Encounter: Payer: Self-pay | Admitting: Gastroenterology

## 2023-11-22 VITALS — BP 122/68 | HR 63 | Temp 98.6°F | Ht 68.0 in | Wt 158.0 lb

## 2023-11-22 DIAGNOSIS — K219 Gastro-esophageal reflux disease without esophagitis: Secondary | ICD-10-CM | POA: Diagnosis not present

## 2023-11-22 DIAGNOSIS — Z8501 Personal history of malignant neoplasm of esophagus: Secondary | ICD-10-CM | POA: Diagnosis not present

## 2023-11-22 DIAGNOSIS — Z87891 Personal history of nicotine dependence: Secondary | ICD-10-CM

## 2023-11-22 DIAGNOSIS — K2270 Barrett's esophagus without dysplasia: Secondary | ICD-10-CM | POA: Diagnosis not present

## 2023-11-22 NOTE — Patient Instructions (Signed)
 Continue lansoprazole  30mg  daily.  Continue sucralfate  as needed for esophageal pain or heartburn up to four times daily as needed.  Reach out if you notice any worrisome symptoms or changes such as difficulty swallowing, increased issues with esophageal pain. Would consider upper endoscopy if any significant change in your symptoms.   Return to the office in one year or sooner if needed.

## 2023-11-22 NOTE — Progress Notes (Signed)
 Gregory Eaton

## 2023-11-22 NOTE — Progress Notes (Signed)
 GI Office Note    Referring Provider: Jenelle Mis, FNP Primary Care Physician:  Jenelle Mis, FNP  Primary Gastroenterologist: Rheba Cedar, MD   Chief Complaint   Chief Complaint  Patient presents with   Follow-up    Follow up on GERD, pt admits to not taking meds as he should    History of Present Illness   Gregory Eaton is a 78 y.o. male presenting today for follow up. Last seen one year ago. Patient has a history of esophageal carcinoma in 2010 status post esophagectomy with gastric pull-up, Barrett's esophagus diagnosed 2019 with no dysplasia, GERD, colonic adenomas.   Overall doing well. Continues to have occasional discomfort or sensation of swelling in the esophagus about once every month or so. Can be quite uncomfortable when it happens.  Chronically has had nocturnal reflux since his esophagectomy with gastric pull-up.  Sleeps with the head of the bed elevated.  Often in the morning has to spit out reflux.  During the day heartburn not much of an issue.  Really no significant dysphagia.  No abdominal pain.  He takes his lansoprazole  most days, occasionally forgets.  Bowel movement every few days with use of MiraLAX .  Trying to increase his water  intake, up to 2-3 bottles per day.  Drinking 1-2 Gatorade's.  Drinks a couple of cups of coffee each day.  Notes that he only eats vegetables about twice per week.  Eats mostly sandwiches.  Seen in the ED back in February with several day history of chest tightness/pain.  CTA chest with contrast completed showing previous esophagectomy with gastric pull-through.  To be within normal limits.  Visualized esophagus within normal limits.  No enlarged lymph nodes seen.  No evidence of PE.  Labs unremarkable.  Treated with prednisone  50 mg daily for 5 days.  Patient did not complete treatment because he felt like it made him feel worse, issues with what felt like a swollen tongue but he is not sure if her lip was swollen.  Symptoms  resolved off of prednisone . Has been evaluated by cardiology for chest pain.  Colonoscopy April 2023: -Diverticulosis -two 3 to 4 mm polyps removed from the hepatic flexure -Tubular adenomas -No future colonoscopy recommended unless new symptoms develop   EGD April 2023: -Status post esophagectomy with gastric pull-up -Abnormal mucosa at anastomosis consistent with Barrett's esophagus -Esophageal biopsies with moderate chronic gastritis with foveolar hyperplasia and intestinal metaplasia.  Scant reactive squamous epithelium.  Negative for dysplasia and carcinoma.  Findings consistent with Barrett's esophagus. -No future endoscopy recommended unless new symptoms develop    Medications   Current Outpatient Medications  Medication Sig Dispense Refill   acetaminophen  (TYLENOL ) 500 MG tablet Take 1,000 mg by mouth daily as needed for headache.     albuterol  (PROVENTIL ) (2.5 MG/3ML) 0.083% nebulizer solution USE ONE VIAL (2.5MG  TOTAL) IN NEBULIZER EVERY SIX HOURS AS NEEDED FOR WHEEZING OR SHORTNESS OF BREATH 75 mL 6   albuterol  (VENTOLIN  HFA) 108 (90 Base) MCG/ACT inhaler Inhale 2 puffs into the lungs every 4 (four) hours as needed for wheezing or shortness of breath. 1 each 0   amLODipine  (NORVASC ) 5 MG tablet Take 1 tablet (5 mg total) by mouth daily. 180 tablet 3   baclofen  (LIORESAL ) 10 MG tablet Take 1 tablet (10 mg total) by mouth 3 (three) times daily as needed for muscle spasms. 20 each 0   brimonidine  (ALPHAGAN ) 0.2 % ophthalmic solution SMARTSIG:In Eye(s)  clopidogrel  (PLAVIX ) 75 MG tablet TAKE ONE (1) TABLET BY MOUTH EVERY DAY 90 tablet 1   gabapentin  (NEURONTIN ) 100 MG capsule Take 1 capsule (100 mg total) by mouth 2 (two) times daily AND 2 capsules (200 mg total) at bedtime. 360 capsule 1   lansoprazole  (PREVACID ) 30 MG capsule Take 1 capsule (30 mg total) by mouth daily before supper. 90 capsule 3   latanoprost  (XALATAN ) 0.005 % ophthalmic solution SMARTSIG:In Eye(s)      losartan  (COZAAR ) 100 MG tablet Take 1 tablet (100 mg total) by mouth at bedtime. 90 tablet 3   metoprolol  succinate (TOPROL -XL) 50 MG 24 hr tablet Take 1 tablet (50 mg total) by mouth daily. Take with or immediately following a meal. 90 tablet 1   montelukast  (SINGULAIR ) 10 MG tablet Take 1 tablet (10 mg total) by mouth daily. 90 tablet 3   Multiple Vitamins-Minerals (CENTRUM SILVER PO) Take 1 tablet by mouth daily.     nitroGLYCERIN  (NITROSTAT ) 0.4 MG SL tablet Place 1 tablet (0.4 mg total) under the tongue every 5 (five) minutes as needed for chest pain. 25 tablet 0   rosuvastatin  (CRESTOR ) 10 MG tablet Take 1 tablet (10 mg total) by mouth daily. 90 tablet 1   spironolactone  (ALDACTONE ) 25 MG tablet TAKE 0.5 TABLETS (12.5 MG TOTATL) BY MOUTH DAILY 45 tablet 3   sucralfate  (CARAFATE ) 1 g tablet Take 1 tablet (1 g total) by mouth 4 (four) times daily -  with meals and at bedtime. As needed for esophageal pain, heartburn. You can crush in couple of tablespoons of water  to make suspension. 120 tablet 5   traZODone  (DESYREL ) 50 MG tablet Take 0.5-1 tablets (25-50 mg total) by mouth at bedtime as needed for sleep. 30 tablet 3   umeclidinium-vilanterol (ANORO ELLIPTA ) 62.5-25 MCG/ACT AEPB Inhale 1 puff into the lungs daily. 60 each 11   No current facility-administered medications for this visit.    Allergies   Allergies as of 11/22/2023 - Review Complete 11/22/2023  Allergen Reaction Noted   Altace [ramipril] Cough 04/07/2014    Review of Systems   General: Negative for anorexia, weight loss, fever, chills, fatigue, weakness. ENT: Negative for hoarseness, difficulty swallowing , nasal congestion. CV: Negative for chest pain, angina, palpitations, dyspnea on exertion, peripheral edema. See hpi Respiratory: Negative for dyspnea at rest, dyspnea on exertion, cough, sputum, wheezing.  GI: See history of present illness. GU:  Negative for dysuria, hematuria, urinary incontinence, urinary  frequency, nocturnal urination.  Endo: Negative for unusual weight change.     Physical Exam   BP 122/68   Pulse 63   Temp 98.6 F (37 C)   Ht 5\' 8"  (1.727 m)   Wt 158 lb (71.7 kg)   BMI 24.02 kg/m    General: Well-nourished, well-developed in no acute distress.  Eyes: No icterus. Mouth: Oropharyngeal mucosa moist and pink   Abdomen: Bowel sounds are normal, nontender, nondistended, no hepatosplenomegaly or masses,  no abdominal bruits or hernia , no rebound or guarding.  Rectal: not performed  Extremities: No lower extremity edema. No clubbing or deformities. Neuro: Alert and oriented x 4   Skin: Warm and dry, no jaundice.   Psych: Alert and cooperative, normal mood and affect.  Labs   Lab Results  Component Value Date   NA 138 09/12/2023   CL 105 09/12/2023   K 4.2 09/12/2023   CO2 21 (L) 09/12/2023   BUN 12 09/12/2023   CREATININE 0.82 09/12/2023   GFRNONAA >60 09/12/2023  CALCIUM  9.7 09/12/2023   ALBUMIN  3.8 05/11/2021   GLUCOSE 98 09/12/2023   Lab Results  Component Value Date   ALT 22 07/19/2023   AST 19 07/19/2023   ALKPHOS 79 05/11/2021   BILITOT 0.5 07/19/2023   Lab Results  Component Value Date   WBC 7.7 09/12/2023   HGB 16.1 09/12/2023   HCT 49.0 09/12/2023   MCV 89.4 09/12/2023   PLT 334 09/12/2023   Lab Results  Component Value Date   HGBA1C 6.5 (H) 07/19/2023    Imaging Studies   No results found.  Assessment/Plan:   GERDBarrett's esophagus: History of esophageal cancer status post esophagectomy with gastric pull-up 2010.  In 2019 he had EGD with evidence of Barrett's esophagus without dysplasia.  No plans for future surveillance given age. EGD if change in symptoms.  -discussed with patient today, appears most of his symptoms are stable with occasional sensation of swelling of the esophagus. No dysphagia. Heartburn controlled for most part. He will continue to monitor symptoms. If any worsening symptoms or change in symptoms, we  could offer EGD for evaluation. Recent CT somewhat reassuring. -continue lansoprazole  30mg  daily -continue sucralfate  prn -return ov in one year.   Trudie Fuse. Harles Lied, MHS, PA-C [facility]

## 2023-11-29 NOTE — Progress Notes (Unsigned)
 Office Note     CC:  follow up Requesting Provider:  Omie Bickers, MD  HPI: Gregory Eaton is a 78 y.o. (1946/07/01) male who presents for surveillance of PAD.    Surgical history:  04/03/2021 left external iliac artery stenting 04/11/2021 - right iliofemoral endarterectomy with bilateral common iliac stents and right external iliac stent  05/11/2021 - left femoral to below the knee popliteal bypass with ringed PTFE 09/01/2022 - drug-coated balloon angioplasty and extension of right iliac stents  On exam, Gregory Eaton was doing well.  He continues to live an active lifestyle, living independently with his wife.  He does not plan on slowing up anytime soon.  He is mowing his grass this afternoon, and plans on doing this for as long as possible.  He denies any claudication, rest pain, or tissue loss.  He is on ASA / Plavix  and statin daily.  He denies tobacco use. He is also followed for carotid artery stenosis.  He does not have any strokelike symptoms including slurring speech, changes in vision, or one-sided weakness.   Has baseline neuropathy in the feet which has been present for a number of years.  Past Medical History:  Diagnosis Date   Adenomatous colon polyp 03/2009   Last colonoscopy by Dr. Riley Cheadle    Adenomatous polyp 2010   Adenomatous polyp of colon 11/03/2010   Arthritis    Barrett's esophagus    CAD (coronary artery disease)    Cataract    Complication of anesthesia    has a shortened esophogus due to CA.    COPD (chronic obstructive pulmonary disease) (HCC)    Severe emphysema per CT   Diverticulosis    Emphysema of lung (HCC)    Esophageal carcinoma (HCC) 03/2009   T1N1M0   GERD (gastroesophageal reflux disease)    Glaucoma    Heart murmur    History of Doppler ultrasound 11/09/2011   03/2014- 50-69% L ICA stenosis;carotid doppler; L bulb/prox ICA 0-49% diameter reduction; L vertebral artery - occlusive ds; L ECA  demonstrates severe amount of fibrous plaque   History  of Doppler ultrasound 11/09/2011   LEAs; R ABI - mod art. insuff.; L ABI normal at rest; R SFA - occlusive ds; L SFA - occlusive ds; patent fem-pop graft   History of echocardiogram 08/27/2009   EF >55%   History of hiatal hernia    History of kidney stones 1965   History of nuclear stress test 11/24/2011   lexiscan ; normal perfusion; low risk scan; non-diagnostic for ischemia   Hyperlipidemia    Hypertension    Left carotid artery stenosis 04/08/2014   Pneumonia    Pre-diabetes    Pulmonary nodule, right 04/08/2014   2.8 mm-incidental finding on CT   PVD (peripheral vascular disease) (HCC)    Tachyarrhythmia 1999   Status post ablation at [facility]   Tobacco abuse     Past Surgical History:  Procedure Laterality Date   ABDOMINAL AORTOGRAM W/LOWER EXTREMITY N/A 04/01/2021   Procedure: ABDOMINAL AORTOGRAM W/LOWER EXTREMITY;  Surgeon: Kayla Part, MD;  Location: [facility] INVASIVE CV LAB;  Service: Cardiovascular;  Laterality: N/A;   ABDOMINAL AORTOGRAM W/LOWER EXTREMITY N/A 09/01/2022   Procedure: ABDOMINAL AORTOGRAM W/LOWER EXTREMITY;  Surgeon: Kayla Part, MD;  Location: [facility] INVASIVE CV LAB;  Service: Cardiovascular;  Laterality: N/A;   BIOPSY  10/24/2017   Procedure: BIOPSY;  Surgeon: Suzette Espy, MD;  Location: [facility];  Service: Endoscopy;;  esophagus   BIOPSY  10/22/2021   Procedure: BIOPSY;  Surgeon: Suzette Espy, MD;  Location: [facility];  Service: Endoscopy;;   CATARACT EXTRACTION Bilateral    COLONOSCOPY  03/17/2009   Dr.Rourk- normal rectum, sigmoid diverticula, some pale sigmoid mucosa with diffuse petechiae. pedunculated polyp at the splenic flexure, remainder of colonic mucosa appeared normal. bx= adenomatous polyp   COLONOSCOPY  04/19/2003   Dr.Rehman- few diverticu;a at the sigmoid colon, 3 small polyps, one at the transverse colon and 2 at the sigmoid, small external hemorrhoids. bx report not available.    COLONOSCOPY WITH PROPOFOL  N/A 10/24/2017    Dr. Riley Cheadle: Diverticulosis, 11 mm polyp at the ileocecal valve, tubular adenoma.  Repeat colonoscopy in 3 years   COLONOSCOPY WITH PROPOFOL  N/A 10/22/2021   Procedure: COLONOSCOPY WITH PROPOFOL ;  Surgeon: Suzette Espy, MD;  Location: [facility];  Service: Endoscopy;  Laterality: N/A;  11:00am   CORONARY ARTERY BYPASS GRAFT  1998   [name]   ENDARTERECTOMY FEMORAL Right 04/13/2021   Procedure: RIGHT ILIOFEMORAL ARTERY ENDARTERECTOMY WITH PATCH ANGIOPLASTY USING HEMASHIELD PALTINUM FINESSE DACRON PATCH;  Surgeon: Kayla Part, MD;  Location: [facility] OR;  Service: Vascular;  Laterality: Right;   ESOPHAGECTOMY  2010   [facility] Dr. Annella Barrows   ESOPHAGOGASTRODUODENOSCOPY  11/25/2010   Dr.Rourk- s/p esophagectomy with gastric pull-up, esophageal erosions straddling the surgical anastomosis, salmon colored epithelium coming up a good centimeter to a centimeter and a half above the suture line, islands of salmon colored epithelium in the most poximal residual esophagus, remainder of gastric mucosa appeared normal. bx= swamous &gastric glandular mucosa w/chronic active inflammation   ESOPHAGOGASTRODUODENOSCOPY  03/17/2009   Dr.Rourk- 4cm segment of salmon-colored epithlium distal esophagus suspicious for barretts esophagus. area of suspicious nodularity w/in this segment bx seperately. small to moderate size hiatal hernia, o/w normal stomach D1 and D2 bx=adenocarcinoma   ESOPHAGOGASTRODUODENOSCOPY (EGD) WITH PROPOFOL  N/A 10/24/2017   Dr. Riley Cheadle: Remnant of esophagus with anastomosis with stomach at 24 cm from the incisors, 2 cm above the anastomosis he was found to have Barrett's but no dysplasia.  Advised for repeat EGD in 3 years   ESOPHAGOGASTRODUODENOSCOPY (EGD) WITH PROPOFOL  N/A 10/22/2021   Procedure: ESOPHAGOGASTRODUODENOSCOPY (EGD) WITH PROPOFOL ;  Surgeon: Suzette Espy, MD;  Location: [facility];  Service: Endoscopy;  Laterality: N/A;   EYE SURGERY     cataract   FEMORAL-POPLITEAL BYPASS  GRAFT  1993   occlusive ds in R SFA   FEMORAL-POPLITEAL BYPASS GRAFT Left 05/11/2021   Procedure: LEFT FEMORAL-BELOW KNEE POPLITEAL ARTERY BYPASS GRAFT WITH 6 mm PTFE and COMPLETION ANGIOGRAM;  Surgeon: Kayla Part, MD;  Location: [facility] OR;  Service: Vascular;  Laterality: Left;   GASTRIC PULL THROUGH  2010   With esophagectomy   INSERTION OF ILIAC STENT Bilateral 04/13/2021   Procedure: INSERTION OF BILATERAL COMMON ILIAC KISSING STENTS AND INSERION OF RIGHT EXTERNAL ILIAC STENT;  Surgeon: Kayla Part, MD;  Location: [facility] OR;  Service: Vascular;  Laterality: Bilateral;   JOINT REPLACEMENT     LIPOMA EXCISION  2010   LOWER EXTREMITY ANGIOGRAM Left 04/13/2021   Procedure: LEFT LOWER EXTREMITY ANGIOGRAM ;  Surgeon: Kayla Part, MD;  Location: [facility] OR;  Service: Vascular;  Laterality: Left;   PERIPHERAL VASCULAR INTERVENTION  04/01/2021   Procedure: PERIPHERAL VASCULAR INTERVENTION;  Surgeon: Kayla Part, MD;  Location: [facility] INVASIVE CV LAB;  Service: Cardiovascular;;   PERIPHERAL VASCULAR INTERVENTION Right 09/01/2022   Procedure: PERIPHERAL VASCULAR INTERVENTION;  Surgeon: Irvin Mantel  E, MD;  Location: [facility] INVASIVE CV LAB;  Service: Cardiovascular;  Laterality: Right;  right external iliac   POLYPECTOMY  10/24/2017   Procedure: POLYPECTOMY;  Surgeon: Suzette Espy, MD;  Location: [facility];  Service: Endoscopy;;  colon    POLYPECTOMY  10/22/2021   Procedure: POLYPECTOMY;  Surgeon: Suzette Espy, MD;  Location: [facility];  Service: Endoscopy;;   TOTAL HIP ARTHROPLASTY Left 01/30/2019   Procedure: TOTAL HIP ARTHROPLASTY ANTERIOR APPROACH;  Surgeon: Saundra Curl, MD;  Location: [facility] ORS;  Service: Orthopedics;  Laterality: Left;   ULTRASOUND GUIDANCE FOR VASCULAR ACCESS Left 04/13/2021   Procedure: ULTRASOUND GUIDANCE FOR VASCULAR ACCESS, left femoral;  Surgeon: Kayla Part, MD;  Location: [facility] OR;  Service: Vascular;  Laterality: Left;    Social History    Socioeconomic History   Marital status: Married    Spouse name: Not on file   Number of children: 2   Years of education: Not on file   Highest education level: 8th grade  Occupational History   Occupation: retired    Associate Professor: RETIRED    Comment: truck driver  Tobacco Use   Smoking status: Former    Current packs/day: 0.00    Average packs/day: 2.0 packs/day for 40.0 years (80.0 ttl pk-yrs)    Types: Cigarettes    Start date: 29    Quit date: 07/20/1995    Years since quitting: 28.3    Passive exposure: Never   Smokeless tobacco: Never  Vaping Use   Vaping status: Never Used  Substance and Sexual Activity   Alcohol use: No    Alcohol/week: 0.0 standard drinks of alcohol   Drug use: No   Sexual activity: Yes    Partners: Female    Birth control/protection: Condom    Comment: friend  Other Topics Concern   Not on file  Social History Narrative   Not on file   Social Drivers of Health   Financial Resource Strain: Medium Risk (09/15/2023)   Overall Financial Resource Strain (CARDIA)    Difficulty of Paying Living Expenses: Somewhat hard  Food Insecurity: No Food Insecurity (09/15/2023)   Hunger Vital Sign    Worried About Running Out of Food in the Last Year: Never true    Ran Out of Food in the Last Year: Never true  Transportation Needs: No Transportation Needs (09/15/2023)   PRAPARE - Administrator, Civil Service (Medical): No    Lack of Transportation (Non-Medical): No  Physical Activity: Insufficiently Active (09/15/2023)   Exercise Vital Sign    Days of Exercise per Week: 3 days    Minutes of Exercise per Session: 30 min  Stress: Stress Concern Present (09/15/2023)   [employer] of Occupational Health - Occupational Stress Questionnaire    Feeling of Stress : To some extent  Social Connections: Moderately Integrated (09/15/2023)   Social Connection and Isolation Panel [NHANES]    Frequency of Communication with Friends and Family: Three  times a week    Frequency of Social Gatherings with Friends and Family: Once a week    Attends Religious Services: More than 4 times per year    Active Member of [employer] or Organizations: No    Attends Banker Meetings: Not on file    Marital Status: Married  Intimate Partner Violence: Unknown (12/04/2022)   Received from [employer], [facility]   HITS    Physically Hurt: Not on file    Insult or Talk Down  To: Not on file    Threaten Physical Harm: Not on file    Scream or Curse: Not on file    Family History  Problem Relation Age of Onset   GER disease Mother    Coronary artery disease Brother    Congenital heart disease Sister    Colon cancer Neg Hx     Current Outpatient Medications  Medication Sig Dispense Refill   acetaminophen  (TYLENOL ) 500 MG tablet Take 1,000 mg by mouth daily as needed for headache.     albuterol  (PROVENTIL ) (2.5 MG/3ML) 0.083% nebulizer solution USE ONE VIAL (2.5MG  TOTAL) IN NEBULIZER EVERY SIX HOURS AS NEEDED FOR WHEEZING OR SHORTNESS OF BREATH 75 mL 6   albuterol  (VENTOLIN  HFA) 108 (90 Base) MCG/ACT inhaler Inhale 2 puffs into the lungs every 4 (four) hours as needed for wheezing or shortness of breath. 1 each 0   amLODipine  (NORVASC ) 5 MG tablet Take 1 tablet (5 mg total) by mouth daily. 180 tablet 3   baclofen  (LIORESAL ) 10 MG tablet Take 1 tablet (10 mg total) by mouth 3 (three) times daily as needed for muscle spasms. 20 each 0   brimonidine  (ALPHAGAN ) 0.2 % ophthalmic solution SMARTSIG:In Eye(s)     clopidogrel  (PLAVIX ) 75 MG tablet TAKE ONE (1) TABLET BY MOUTH EVERY DAY 90 tablet 1   gabapentin  (NEURONTIN ) 100 MG capsule Take 1 capsule (100 mg total) by mouth 2 (two) times daily AND 2 capsules (200 mg total) at bedtime. 360 capsule 1   lansoprazole  (PREVACID ) 30 MG capsule Take 1 capsule (30 mg total) by mouth daily before supper. 90 capsule 3   latanoprost  (XALATAN ) 0.005 % ophthalmic solution SMARTSIG:In Eye(s)     losartan   (COZAAR ) 100 MG tablet Take 1 tablet (100 mg total) by mouth at bedtime. 90 tablet 3   metoprolol  succinate (TOPROL -XL) 50 MG 24 hr tablet Take 1 tablet (50 mg total) by mouth daily. Take with or immediately following a meal. 90 tablet 1   montelukast  (SINGULAIR ) 10 MG tablet Take 1 tablet (10 mg total) by mouth daily. 90 tablet 3   Multiple Vitamins-Minerals (CENTRUM SILVER PO) Take 1 tablet by mouth daily.     nitroGLYCERIN  (NITROSTAT ) 0.4 MG SL tablet Place 1 tablet (0.4 mg total) under the tongue every 5 (five) minutes as needed for chest pain. 25 tablet 0   rosuvastatin  (CRESTOR ) 10 MG tablet Take 1 tablet (10 mg total) by mouth daily. 90 tablet 1   spironolactone  (ALDACTONE ) 25 MG tablet TAKE 0.5 TABLETS (12.5 MG TOTATL) BY MOUTH DAILY 45 tablet 3   sucralfate  (CARAFATE ) 1 g tablet Take 1 tablet (1 g total) by mouth 4 (four) times daily -  with meals and at bedtime. As needed for esophageal pain, heartburn. You can crush in couple of tablespoons of water  to make suspension. 120 tablet 5   traZODone  (DESYREL ) 50 MG tablet Take 0.5-1 tablets (25-50 mg total) by mouth at bedtime as needed for sleep. 30 tablet 3   umeclidinium-vilanterol (ANORO ELLIPTA ) 62.5-25 MCG/ACT AEPB Inhale 1 puff into the lungs daily. 60 each 11   No current facility-administered medications for this visit.    Allergies  Allergen Reactions   Altace [Ramipril] Cough     REVIEW OF SYSTEMS:   [X]  denotes positive finding, [ ]  denotes negative finding Cardiac  Comments:  Chest pain or chest pressure:    Shortness of breath upon exertion:    Short of breath when lying flat:    Irregular heart  rhythm:        Vascular    Pain in calf, thigh, or hip brought on by ambulation:    Pain in feet at night that wakes you up from your sleep:     Blood clot in your veins:    Leg swelling:         Pulmonary    Oxygen  at home:    Productive cough:     Wheezing:         Neurologic    Sudden weakness in arms or legs:      Sudden numbness in arms or legs:     Sudden onset of difficulty speaking or slurred speech:    Temporary loss of vision in one eye:     Problems with dizziness:         Gastrointestinal    Blood in stool:     Vomited blood:         Genitourinary    Burning when urinating:     Blood in urine:        Psychiatric    Major depression:         Hematologic    Bleeding problems:    Problems with blood clotting too easily:        Skin    Rashes or ulcers:        Constitutional    Fever or chills:      PHYSICAL EXAMINATION:  There were no vitals filed for this visit.   General:  WDWN in NAD; vital signs documented above Gait: Not observed HENT: WNL, normocephalic Pulmonary: normal non-labored breathing , without Rales, rhonchi,  wheezing Cardiac: regular HR Abdomen: soft, NT, no masses Skin: without rashes Vascular Exam/Pulses: Palpable left DP; symmetrical 2+ femoral pulses; absent right pedal pulses Extremities: without ischemic changes, without Gangrene , without cellulitis; without open wounds;  Musculoskeletal: no muscle wasting or atrophy  Neurologic: A&O X 3;  No focal weakness or paresthesias are detected Psychiatric:  The pt has Normal affect.   Non-Invasive Vascular Imaging:    Abdominal Aorta Findings:  +-------------+-------+----------+----------+----------+--------+--------+  Location    AP (cm)Trans (cm)PSV (cm/s)Waveform  ThrombusComments  +-------------+-------+----------+----------+----------+--------+--------+  Distal                       57                                    +-------------+-------+----------+----------+----------+--------+--------+  RT CIA Prox                   82        monophasic                  +-------------+-------+----------+----------+----------+--------+--------+  RT CIA Mid                    75        monophasic                   +-------------+-------+----------+----------+----------+--------+--------+  RT CIA Distal                 86        monophasic                  +-------------+-------+----------+----------+----------+--------+--------+  RT EIA Prox  115       monophasic                  +-------------+-------+----------+----------+----------+--------+--------+  RT EIA Mid                    258       monophasic                  +-------------+-------+----------+----------+----------+--------+--------+  RT EIA Distal                 164       biphasic                    +-------------+-------+----------+----------+----------+--------+--------+  LT CIA Prox                   71        monophasic                  +-------------+-------+----------+----------+----------+--------+--------+  LT CIA Mid                    104       biphasic                    +-------------+-------+----------+----------+----------+--------+--------+  LT CIA Distal                 96        biphasic                    +-------------+-------+----------+----------+----------+--------+--------+  LT EIA Prox                   167       biphasic                    +-------------+-------+----------+----------+----------+--------+--------+  LT EIA Mid                    209       biphasic                    +-------------+-------+----------+----------+----------+--------+--------+  LT EIA Distal                 222       biphasic                    +-------------+-------+----------+----------+----------+--------+--------+   ABI Findings:  +---------+------------------+-----+----------+--------+  Right   Rt Pressure (mmHg)IndexWaveform  Comment   +---------+------------------+-----+----------+--------+  Brachial 142                                        +---------+------------------+-----+----------+--------+  ATA     175                1.12 monophasic          +---------+------------------+-----+----------+--------+  PTA     255               1.63 monophasic          +---------+------------------+-----+----------+--------+  Great Toe57                0.37                     +---------+------------------+-----+----------+--------+   +---------+------------------+-----+----------+-------+  Left    Lt Pressure (mmHg)IndexWaveform  Comment  +---------+------------------+-----+----------+-------+  Brachial 156                                       +---------+------------------+-----+----------+-------+  ATA     255               1.63 biphasic           +---------+------------------+-----+----------+-------+  PTA     255               1.63 monophasic         +---------+------------------+-----+----------+-------+  Great Toe97                0.62                    +---------+------------------+-----+----------+-------+   +-------+-----------+-----------+------------+------------+  ABI/TBIToday's ABIToday's TBIPrevious ABIPrevious TBI  +-------+-----------+-----------+------------+------------+  Right [place]         0.37       [place]          0.46          +-------+-----------+-----------+------------+------------+  Left  [place]         0.62       [place]          0.74          +-------+-----------+-----------+------------+------------+   ASSESSMENT/PLAN:: 78 y.o. male here for follow up for surveillance of PAD and carotid artery stenosis.  From a cerebrovascular standpoint-I am following with yearly ultrasounds.  This has been scheduled for his next 42-month follow-up  Regarding his lower extremities, on physical exam he had a palpable pulse in bilateral femoral arteries, palpable DP in the left foot.  Imaging studies were reviewed including aortic duplex ultrasound, left lower extremity bypass duplex ultrasound, ABI all 3 of the studies demonstrated no significant  changes.  Stents are widely patent, bypass widely patent, ABI unchanged.  My plan is to follow him in 100-month intervals at this point.  He was asked, office should any questions or concerns arise.    Kayla Part, MD [facility] [phone] Total time of patient care including pre-visit research, consultation, and documentation greater than 30 minutes

## 2023-12-01 ENCOUNTER — Ambulatory Visit (HOSPITAL_BASED_OUTPATIENT_CLINIC_OR_DEPARTMENT_OTHER)
Admission: RE | Admit: 2023-12-01 | Discharge: 2023-12-01 | Disposition: A | Discharge status: Home / Self Care | Source: Ambulatory Visit | Attending: Vascular Surgery | Admitting: Vascular Surgery

## 2023-12-01 ENCOUNTER — Ambulatory Visit (HOSPITAL_COMMUNITY)
Admission: RE | Admit: 2023-12-01 | Discharge: 2023-12-01 | Disposition: A | Discharge status: Home / Self Care | Source: Ambulatory Visit | Attending: Vascular Surgery | Admitting: Vascular Surgery

## 2023-12-01 ENCOUNTER — Encounter: Payer: Self-pay | Admitting: Vascular Surgery

## 2023-12-01 ENCOUNTER — Ambulatory Visit: Attending: Vascular Surgery | Admitting: Vascular Surgery

## 2023-12-01 VITALS — BP 156/68 | HR 80 | Temp 97.7°F | Ht 68.0 in | Wt 156.0 lb

## 2023-12-01 DIAGNOSIS — I739 Peripheral vascular disease, unspecified: Secondary | ICD-10-CM | POA: Diagnosis not present

## 2023-12-01 DIAGNOSIS — Z95828 Presence of other vascular implants and grafts: Secondary | ICD-10-CM

## 2023-12-01 DIAGNOSIS — I6522 Occlusion and stenosis of left carotid artery: Secondary | ICD-10-CM | POA: Diagnosis not present

## 2023-12-01 LAB — VAS US ABI WITH/WO TBI

## 2023-12-06 ENCOUNTER — Other Ambulatory Visit: Payer: Self-pay | Admitting: *Deleted

## 2023-12-06 DIAGNOSIS — Z95828 Presence of other vascular implants and grafts: Secondary | ICD-10-CM

## 2023-12-06 DIAGNOSIS — I6522 Occlusion and stenosis of left carotid artery: Secondary | ICD-10-CM

## 2023-12-06 DIAGNOSIS — I739 Peripheral vascular disease, unspecified: Secondary | ICD-10-CM

## 2023-12-09 ENCOUNTER — Ambulatory Visit: Admitting: Neurology

## 2023-12-09 ENCOUNTER — Encounter: Payer: Self-pay | Admitting: Neurology

## 2023-12-09 VITALS — BP 154/70 | HR 78 | Ht 68.0 in | Wt 155.6 lb

## 2023-12-09 DIAGNOSIS — G8929 Other chronic pain: Secondary | ICD-10-CM | POA: Diagnosis not present

## 2023-12-09 DIAGNOSIS — M5481 Occipital neuralgia: Secondary | ICD-10-CM

## 2023-12-09 DIAGNOSIS — M542 Cervicalgia: Secondary | ICD-10-CM

## 2023-12-09 DIAGNOSIS — M47812 Spondylosis without myelopathy or radiculopathy, cervical region: Secondary | ICD-10-CM | POA: Diagnosis not present

## 2023-12-09 NOTE — Progress Notes (Signed)
 [id number]    Provider:  Dr Tresia Fruit Requesting Provider: Jenelle Mis, FNP Primary Care Provider:  Jenelle Mis, FNP  CC:  neck pain  HPI:  Gregory Eaton is a 78 y.o. male here as requested by Jenelle Mis, FNP for neck pain. has LIPOMA; HYPERLIPIDEMIA; Essential hypertension; Coronary atherosclerosis; Peripheral vascular disease (HCC); GERD; BARRETTS ESOPHAGUS; Constipation; RECTAL BLEEDING; CAROTID BRUIT, RIGHT; Adenomatous polyp of colon; Esophageal carcinoma (HCC); Near syncope; COPD with emphysema (HCC); Left carotid artery stenosis; Pulmonary nodule, right; DM type 2 (diabetes mellitus, type 2) (HCC); Hyperlipidemia LDL goal <70; S/P CABG x 5; COPD GOLD 2 , group B, by GOLD 2017 classification (HCC); Cerebrovascular disease; Abdominal pain, chronic, epigastric; Esophageal dysphagia; H/O adenomatous polyp of colon; Cancer of abdominal esophagus (HCC); Primary osteoarthritis of left hip; S/P total hip arthroplasty; Syncope; PAD (peripheral artery disease) (HCC); Physical exam, annual; Acute upper respiratory infection; Anxiety; Carotid bruit; Chronic back pain; Chronic insomnia; Colon polyp; COVID-19; Dizziness; Drug-induced myopathy; Epistaxis; Gastroesophageal reflux disease with esophagitis without hemorrhage; Generalized anxiety disorder; Glaucoma of both eyes associated with underlying disease; Hyperglycemia due to type 2 diabetes mellitus (HCC); Hypoxia; Idiopathic stabbing headache; Impaired fasting blood sugar; Intolerant of cold; Middle ear effusion; Nausea; Neuropathy; Nocturia; Occipital headache; Orthostatic hypotension; Verruca vulgaris; Unintentional weight loss; Simple chronic bronchitis (HCC); Prediabetes; Restless leg syndrome; Seasonal allergies; and Dyspnea on exertion on their problem list.  Patient with chronic neck pain since at least 2006 (saw neurologist Dr. Joleen Navy at that time who performed an MRI with multi-level degenerative changes and facet  arthropathy from c3-c4 to c5-c6). The neck pain is in the neck and behind the ears, shoots pains to behind the head, tried muscle relaxers, gabapentin , never know when it is going to hit, moving head makes it worse, no shooting pains into the arms or weakness in the arms or legs, wife is here and provides information. The pain is continuous, worse when laying on moving, helps to keep still, not taking the gabapentin  as prescribed recommend taking gabapentin  as prescribed. No other focal neurologic deficits, associated symptoms, inciting events or modifiable factors.  Reviewed notes, labs and imaging from outside physicians, which showed :  Meds:Baclofen , amlodipine , gabapentin , metoprolol , losartan , trazodone , tylenol , aspirin , magnesium , reglan , prednisone , tizanidine      Latest Ref Rng & Units 09/12/2023   12:32 PM 07/19/2023   10:36 AM 09/01/2022    9:41 AM  CBC  WBC 4.0 - 10.5 K/uL 7.7  7.0    Hemoglobin 13.0 - 17.0 g/dL 78.2  95.6  21.3   Hematocrit 39.0 - 52.0 % 49.0  48.0  46.0   Platelets 150 - 400 K/uL 334  374        Latest Ref Rng & Units 09/12/2023   12:32 PM 07/19/2023   10:36 AM 11/19/2022    9:33 AM  CMP  Glucose 70 - 99 mg/dL 98  086  578   BUN 8 - 23 mg/dL 12  13  17    Creatinine 0.61 - 1.24 mg/dL 4.69  6.29  5.28   Sodium 135 - 145 mmol/L 138  142  139   Potassium 3.5 - 5.1 mmol/L 4.2  4.5  5.0   Chloride 98 - 111 mmol/L 105  105  105   CO2 22 - 32 mmol/L 21  24  25    Calcium  8.9 - 10.3 mg/dL 9.7  41.3  9.3   Total Protein 6.1 - 8.1 g/dL  7.5    Total  Bilirubin 0.2 - 1.2 mg/dL  0.5    AST 10 - 35 U/L  19    ALT 9 - 46 U/L  22       CT head 09/12/2023: CLINICAL DATA:  Headache, increasing frequency or severity.   EXAM: CT HEAD WITHOUT CONTRAST   TECHNIQUE: Contiguous axial images were obtained from the base of the skull through the vertex without intravenous contrast.   RADIATION DOSE REDUCTION: This exam was performed according to the departmental  dose-optimization program which includes automated exposure control, adjustment of the mA and/or kV according to patient size and/or use of iterative reconstruction technique.   COMPARISON:  CT head without contrast 09/27/2022   FINDINGS: Brain: Periventricular and scattered subcortical white matter hypoattenuation is similar prior exam. No acute infarct, hemorrhage, or mass lesion is present. The ventricles are of normal size. No significant extraaxial fluid collection is present. Deep brain nuclei are within normal limits. The ventricles are of normal size.   The brainstem and cerebellum are within normal limits.   Vascular: Atherosclerotic calcifications are present within the cavernous internal carotid arteries and at the dural margin both vertebral arteries. No hyperdense vessel is present.   Skull: Calvarium is intact. No focal lytic or blastic lesions are present. No significant extracranial soft tissue lesion is present.   Sinuses/Orbits: The paranasal sinuses and mastoid air cells are clear. Bilateral lens replacements are noted. Globes and orbits are otherwise unremarkable.   IMPRESSION: 1. No acute intracranial abnormality or significant interval change. 2. Stable periventricular and scattered subcortical white matter hypoattenuation. This likely reflects the sequela of chronic microvascular ischemia.  07/19/2023: hgba1c 6.5  2006 MRI cervical spine Clinical Data: Cervical pain radiating into the right shoulder.   Comparison: 02/04/04.  MRI CERVICAL SPINE WITHOUT CONTRAST:  Technique: Multiplanar and multiecho pulse sequences of the cervical spine, to include the base of the skull and upper thoracic region, were obtained according to standard protocol without IV contrast.  Findings: Patient motion somewhat limits the study. This is despite multiple repeated sequences. Vertebral body height, signal and alignment are maintained. Craniocervical junction appears normal.  Spinal cord signal is normal. C2-3 level is unremarkable.  At C3-4, there is facet arthropathy, asymmetrically worse on the right. Central spinal canal and foramina remain open.  At C4-5, again seen is facet arthropathy which is fairly symmetric at this level. No disk bulge or protrusion. Central spinal canal and foramina are open.   At C5-6, there is a minimal diffuse disk bulge which narrows the ventral thecal sac. Facet arthropathy is present. Central canal and foramina remain open.   At C6-7, there is minimal disk bulging without central canal or foraminal stenosis.   The C7-T1 level is unremarkable.  IMPRESSION:  1. Minimal disk bulging at C5-6 without central canal stenosis.   2. Facet arthropathy, most notable from C3-4 to C5-6.   Review of Systems: Patient complains of symptoms per HPI as well as the following symptoms back pain. Pertinent negatives and positives per HPI. All others negative.   Social History   Socioeconomic History   Marital status: Married    Spouse name: Not on file   Number of children: 2   Years of education: Not on file   Highest education level: 8th grade  Occupational History   Occupation: retired    Associate Professor: RETIRED    Comment: truck driver  Tobacco Use   Smoking status: Former    Current packs/day: 0.00    Average packs/day: 2.0  packs/day for 40.0 years (80.0 ttl pk-yrs)    Types: Cigarettes    Start date: 4    Quit date: 07/20/1995    Years since quitting: 28.4    Passive exposure: Never   Smokeless tobacco: Never  Vaping Use   Vaping status: Never Used  Substance and Sexual Activity   Alcohol use: No    Alcohol/week: 0.0 standard drinks of alcohol   Drug use: No   Sexual activity: Yes    Partners: Female    Birth control/protection: Condom    Comment: friend  Other Topics Concern   Not on file  Social History Narrative   Not on file   Social Drivers of Health   Financial Resource Strain: Medium Risk (09/15/2023)   Overall  Financial Resource Strain (CARDIA)    Difficulty of Paying Living Expenses: Somewhat hard  Food Insecurity: No Food Insecurity (09/15/2023)   Hunger Vital Sign    Worried About Running Out of Food in the Last Year: Never true    Ran Out of Food in the Last Year: Never true  Transportation Needs: No Transportation Needs (09/15/2023)   PRAPARE - Administrator, Civil Service (Medical): No    Lack of Transportation (Non-Medical): No  Physical Activity: Insufficiently Active (09/15/2023)   Exercise Vital Sign    Days of Exercise per Week: 3 days    Minutes of Exercise per Session: 30 min  Stress: Stress Concern Present (09/15/2023)   [employer] of Occupational Health - Occupational Stress Questionnaire    Feeling of Stress : To some extent  Social Connections: Moderately Integrated (09/15/2023)   Social Connection and Isolation Panel [NHANES]    Frequency of Communication with Friends and Family: Three times a week    Frequency of Social Gatherings with Friends and Family: Once a week    Attends Religious Services: More than 4 times per year    Active Member of [employer] or Organizations: No    Attends Engineer, structural: Not on file    Marital Status: Married  Intimate Partner Violence: Unknown (12/04/2022)   Received from [employer], [facility]   HITS    Physically Hurt: Not on file    Insult or Talk Down To: Not on file    Threaten Physical Harm: Not on file    Scream or Curse: Not on file    Family History  Problem Relation Age of Onset   GER disease Mother    Coronary artery disease Brother    Congenital heart disease Sister    Colon cancer Neg Hx     Past Medical History:  Diagnosis Date   Adenomatous colon polyp 03/2009   Last colonoscopy by Dr. Riley Cheadle    Adenomatous polyp 2010   Adenomatous polyp of colon 11/03/2010   Arthritis    Barrett's esophagus    CAD (coronary artery disease)    Cataract    Complication of anesthesia    has a  shortened esophogus due to CA.    COPD (chronic obstructive pulmonary disease) (HCC)    Severe emphysema per CT   Diverticulosis    Emphysema of lung (HCC)    Esophageal carcinoma (HCC) 03/2009   T1N1M0   GERD (gastroesophageal reflux disease)    Glaucoma    Heart murmur    History of Doppler ultrasound 11/09/2011   03/2014- 50-69% L ICA stenosis;carotid doppler; L bulb/prox ICA 0-49% diameter reduction; L vertebral artery - occlusive ds; L ECA  demonstrates severe  amount of fibrous plaque   History of Doppler ultrasound 11/09/2011   LEAs; R ABI - mod art. insuff.; L ABI normal at rest; R SFA - occlusive ds; L SFA - occlusive ds; patent fem-pop graft   History of echocardiogram 08/27/2009   EF >55%   History of hiatal hernia    History of kidney stones 1965   History of nuclear stress test 11/24/2011   lexiscan ; normal perfusion; low risk scan; non-diagnostic for ischemia   Hyperlipidemia    Hypertension    Left carotid artery stenosis 04/08/2014   Pneumonia    Pre-diabetes    Pulmonary nodule, right 04/08/2014   2.8 mm-incidental finding on CT   PVD (peripheral vascular disease) (HCC)    Tachyarrhythmia 1999   Status post ablation at [facility]   Tobacco abuse     Patient Active Problem List   Diagnosis Date Noted   Dyspnea on exertion 09/19/2023   Physical exam, annual 07/19/2023   Acute upper respiratory infection 05/09/2023   COVID-19 03/07/2023   Middle ear effusion 01/05/2023   Idiopathic stabbing headache 09/27/2022   Occipital headache 09/27/2022   Verruca vulgaris 09/10/2022   Hyperglycemia due to type 2 diabetes mellitus (HCC) 08/19/2022   Carotid bruit 03/23/2022   Dizziness 03/23/2022   Hypoxia 03/23/2022   Orthostatic hypotension 03/23/2022   Anxiety 02/17/2022   Prediabetes 02/16/2022   Chronic back pain 01/27/2022   Chronic insomnia 01/27/2022   Intolerant of cold 01/27/2022   Nausea 01/27/2022   Neuropathy 01/27/2022   Unintentional weight loss  01/27/2022   Epistaxis 11/30/2021   PAD (peripheral artery disease) (HCC) 04/13/2021   Syncope 01/01/2021   Drug-induced myopathy 11/11/2020   Impaired fasting blood sugar 11/11/2020   Nocturia 11/11/2020   Colon polyp 08/20/2020   Gastroesophageal reflux disease with esophagitis without hemorrhage 08/20/2020   Generalized anxiety disorder 08/20/2020   Glaucoma of both eyes associated with underlying disease 08/20/2020   Simple chronic bronchitis (HCC) 08/20/2020   Restless leg syndrome 08/20/2020   Seasonal allergies 08/20/2020   Primary osteoarthritis of left hip 01/30/2019   S/P total hip arthroplasty 01/30/2019   Abdominal pain, chronic, epigastric 09/26/2017   Esophageal dysphagia 09/26/2017   H/O adenomatous polyp of colon 09/26/2017   Cerebrovascular disease 05/12/2016   COPD GOLD 2 , group B, by GOLD 2017 classification (HCC) 07/18/2015   S/P CABG x 5 01/30/2015   Hyperlipidemia LDL goal <70 10/09/2014   DM type 2 (diabetes mellitus, type 2) (HCC) 04/09/2014   Left carotid artery stenosis 04/08/2014   Pulmonary nodule, right 04/08/2014   Near syncope 04/07/2014   COPD with emphysema (HCC) 04/07/2014   Cancer of abdominal esophagus (HCC) 10/06/2011   Adenomatous polyp of colon 11/03/2010   Esophageal carcinoma (HCC) 03/19/2009   BARRETTS ESOPHAGUS 03/11/2009   Constipation 03/11/2009   Essential hypertension 03/10/2009   RECTAL BLEEDING 03/10/2009   LIPOMA 02/03/2009   HYPERLIPIDEMIA 02/03/2009   Coronary atherosclerosis 02/03/2009   Peripheral vascular disease (HCC) 02/03/2009   GERD 02/03/2009   CAROTID BRUIT, RIGHT 02/03/2009    Past Surgical History:  Procedure Laterality Date   ABDOMINAL AORTOGRAM W/LOWER EXTREMITY N/A 04/01/2021   Procedure: ABDOMINAL AORTOGRAM W/LOWER EXTREMITY;  Surgeon: Kayla Part, MD;  Location: [facility] INVASIVE CV LAB;  Service: Cardiovascular;  Laterality: N/A;   ABDOMINAL AORTOGRAM W/LOWER EXTREMITY N/A 09/01/2022   Procedure:  ABDOMINAL AORTOGRAM W/LOWER EXTREMITY;  Surgeon: Kayla Part, MD;  Location: [facility] INVASIVE CV LAB;  Service: Cardiovascular;  Laterality:  N/A;   BIOPSY  10/24/2017   Procedure: BIOPSY;  Surgeon: Suzette Espy, MD;  Location: [facility];  Service: Endoscopy;;  esophagus   BIOPSY  10/22/2021   Procedure: BIOPSY;  Surgeon: Suzette Espy, MD;  Location: [facility];  Service: Endoscopy;;   CATARACT EXTRACTION Bilateral    COLONOSCOPY  03/17/2009   Dr.Rourk- normal rectum, sigmoid diverticula, some pale sigmoid mucosa with diffuse petechiae. pedunculated polyp at the splenic flexure, remainder of colonic mucosa appeared normal. bx= adenomatous polyp   COLONOSCOPY  04/19/2003   Dr.Rehman- few diverticu;a at the sigmoid colon, 3 small polyps, one at the transverse colon and 2 at the sigmoid, small external hemorrhoids. bx report not available.    COLONOSCOPY WITH PROPOFOL  N/A 10/24/2017   Dr. Riley Cheadle: Diverticulosis, 11 mm polyp at the ileocecal valve, tubular adenoma.  Repeat colonoscopy in 3 years   COLONOSCOPY WITH PROPOFOL  N/A 10/22/2021   Procedure: COLONOSCOPY WITH PROPOFOL ;  Surgeon: Suzette Espy, MD;  Location: [facility];  Service: Endoscopy;  Laterality: N/A;  11:00am   CORONARY ARTERY BYPASS GRAFT  1998   [name]   ENDARTERECTOMY FEMORAL Right 04/13/2021   Procedure: RIGHT ILIOFEMORAL ARTERY ENDARTERECTOMY WITH PATCH ANGIOPLASTY USING HEMASHIELD PALTINUM FINESSE DACRON PATCH;  Surgeon: Kayla Part, MD;  Location: [facility] OR;  Service: Vascular;  Laterality: Right;   ESOPHAGECTOMY  2010   [facility] Dr. Annella Barrows   ESOPHAGOGASTRODUODENOSCOPY  11/25/2010   Dr.Rourk- s/p esophagectomy with gastric pull-up, esophageal erosions straddling the surgical anastomosis, salmon colored epithelium coming up a good centimeter to a centimeter and a half above the suture line, islands of salmon colored epithelium in the most poximal residual esophagus, remainder of gastric mucosa appeared  normal. bx= swamous &gastric glandular mucosa w/chronic active inflammation   ESOPHAGOGASTRODUODENOSCOPY  03/17/2009   Dr.Rourk- 4cm segment of salmon-colored epithlium distal esophagus suspicious for barretts esophagus. area of suspicious nodularity w/in this segment bx seperately. small to moderate size hiatal hernia, o/w normal stomach D1 and D2 bx=adenocarcinoma   ESOPHAGOGASTRODUODENOSCOPY (EGD) WITH PROPOFOL  N/A 10/24/2017   Dr. Riley Cheadle: Remnant of esophagus with anastomosis with stomach at 24 cm from the incisors, 2 cm above the anastomosis he was found to have Barrett's but no dysplasia.  Advised for repeat EGD in 3 years   ESOPHAGOGASTRODUODENOSCOPY (EGD) WITH PROPOFOL  N/A 10/22/2021   Procedure: ESOPHAGOGASTRODUODENOSCOPY (EGD) WITH PROPOFOL ;  Surgeon: Suzette Espy, MD;  Location: [facility];  Service: Endoscopy;  Laterality: N/A;   EYE SURGERY     cataract   FEMORAL-POPLITEAL BYPASS GRAFT  1993   occlusive ds in R SFA   FEMORAL-POPLITEAL BYPASS GRAFT Left 05/11/2021   Procedure: LEFT FEMORAL-BELOW KNEE POPLITEAL ARTERY BYPASS GRAFT WITH 6 mm PTFE and COMPLETION ANGIOGRAM;  Surgeon: Kayla Part, MD;  Location: [facility] OR;  Service: Vascular;  Laterality: Left;   GASTRIC PULL THROUGH  2010   With esophagectomy   INSERTION OF ILIAC STENT Bilateral 04/13/2021   Procedure: INSERTION OF BILATERAL COMMON ILIAC KISSING STENTS AND INSERION OF RIGHT EXTERNAL ILIAC STENT;  Surgeon: Kayla Part, MD;  Location: [facility] OR;  Service: Vascular;  Laterality: Bilateral;   JOINT REPLACEMENT     LIPOMA EXCISION  2010   LOWER EXTREMITY ANGIOGRAM Left 04/13/2021   Procedure: LEFT LOWER EXTREMITY ANGIOGRAM ;  Surgeon: Kayla Part, MD;  Location: [facility] OR;  Service: Vascular;  Laterality: Left;   PERIPHERAL VASCULAR INTERVENTION  04/01/2021   Procedure: PERIPHERAL VASCULAR INTERVENTION;  Surgeon: Rosalva Comber,  Lonzell Robin, MD;  Location: [facility] INVASIVE CV LAB;  Service: Cardiovascular;;   PERIPHERAL VASCULAR  INTERVENTION Right 09/01/2022   Procedure: PERIPHERAL VASCULAR INTERVENTION;  Surgeon: Kayla Part, MD;  Location: [facility] INVASIVE CV LAB;  Service: Cardiovascular;  Laterality: Right;  right external iliac   POLYPECTOMY  10/24/2017   Procedure: POLYPECTOMY;  Surgeon: Suzette Espy, MD;  Location: [facility];  Service: Endoscopy;;  colon    POLYPECTOMY  10/22/2021   Procedure: POLYPECTOMY;  Surgeon: Suzette Espy, MD;  Location: [facility];  Service: Endoscopy;;   TOTAL HIP ARTHROPLASTY Left 01/30/2019   Procedure: TOTAL HIP ARTHROPLASTY ANTERIOR APPROACH;  Surgeon: Saundra Curl, MD;  Location: [facility] ORS;  Service: Orthopedics;  Laterality: Left;   ULTRASOUND GUIDANCE FOR VASCULAR ACCESS Left 04/13/2021   Procedure: ULTRASOUND GUIDANCE FOR VASCULAR ACCESS, left femoral;  Surgeon: Kayla Part, MD;  Location: [facility] OR;  Service: Vascular;  Laterality: Left;    Current Outpatient Medications  Medication Sig Dispense Refill   acetaminophen  (TYLENOL ) 500 MG tablet Take 1,000 mg by mouth daily as needed for headache.     albuterol  (PROVENTIL ) (2.5 MG/3ML) 0.083% nebulizer solution USE ONE VIAL (2.5MG  TOTAL) IN NEBULIZER EVERY SIX HOURS AS NEEDED FOR WHEEZING OR SHORTNESS OF BREATH 75 mL 6   albuterol  (VENTOLIN  HFA) 108 (90 Base) MCG/ACT inhaler Inhale 2 puffs into the lungs every 4 (four) hours as needed for wheezing or shortness of breath. 1 each 0   amLODipine  (NORVASC ) 5 MG tablet Take 1 tablet (5 mg total) by mouth daily. 180 tablet 3   baclofen  (LIORESAL ) 10 MG tablet Take 1 tablet (10 mg total) by mouth 3 (three) times daily as needed for muscle spasms. 20 each 0   brimonidine  (ALPHAGAN ) 0.2 % ophthalmic solution SMARTSIG:In Eye(s)     clopidogrel  (PLAVIX ) 75 MG tablet TAKE ONE (1) TABLET BY MOUTH EVERY DAY 90 tablet 1   gabapentin  (NEURONTIN ) 100 MG capsule Take 1 capsule (100 mg total) by mouth 2 (two) times daily AND 2 capsules (200 mg total) at bedtime. 360 capsule 1    lansoprazole  (PREVACID ) 30 MG capsule Take 1 capsule (30 mg total) by mouth daily before supper. 90 capsule 3   latanoprost  (XALATAN ) 0.005 % ophthalmic solution SMARTSIG:In Eye(s)     losartan  (COZAAR ) 100 MG tablet Take 1 tablet (100 mg total) by mouth at bedtime. 90 tablet 3   metoprolol  succinate (TOPROL -XL) 50 MG 24 hr tablet Take 1 tablet (50 mg total) by mouth daily. Take with or immediately following a meal. 90 tablet 1   montelukast  (SINGULAIR ) 10 MG tablet Take 1 tablet (10 mg total) by mouth daily. 90 tablet 3   Multiple Vitamins-Minerals (CENTRUM SILVER PO) Take 1 tablet by mouth daily.     nitroGLYCERIN  (NITROSTAT ) 0.4 MG SL tablet Place 1 tablet (0.4 mg total) under the tongue every 5 (five) minutes as needed for chest pain. 25 tablet 0   rosuvastatin  (CRESTOR ) 10 MG tablet Take 1 tablet (10 mg total) by mouth daily. 90 tablet 1   spironolactone  (ALDACTONE ) 25 MG tablet TAKE 0.5 TABLETS (12.5 MG TOTATL) BY MOUTH DAILY 45 tablet 3   sucralfate  (CARAFATE ) 1 g tablet Take 1 tablet (1 g total) by mouth 4 (four) times daily -  with meals and at bedtime. As needed for esophageal pain, heartburn. You can crush in couple of tablespoons of water  to make suspension. 120 tablet 5   traZODone  (DESYREL ) 50 MG tablet Take 0.5-1  tablets (25-50 mg total) by mouth at bedtime as needed for sleep. 30 tablet 3   umeclidinium-vilanterol (ANORO ELLIPTA ) 62.5-25 MCG/ACT AEPB Inhale 1 puff into the lungs daily. 60 each 11   No current facility-administered medications for this visit.    Allergies as of 12/09/2023 - Review Complete 12/09/2023  Allergen Reaction Noted   Altace [ramipril] Cough 04/07/2014    Vitals: BP (!) 154/70 (BP Location: Left Arm, Patient Position: Sitting, Cuff Size: Normal)   Pulse 78   Ht 5\' 8"  (1.727 m)   Wt 155 lb 9.6 oz (70.6 kg)   BMI 23.66 kg/m  Last Weight:  Wt Readings from Last 1 Encounters:  12/09/23 155 lb 9.6 oz (70.6 kg)   Last Height:   Ht Readings from Last  1 Encounters:  12/09/23 5\' 8"  (1.727 m)     Physical exam: Exam: Gen: NAD, conversant, well nourised, well groomed                     CV: RRR, no MRG. No Carotid Bruits. No peripheral edema, warm, nontender Eyes: Conjunctivae clear without exudates or hemorrhage  Neuro: Detailed Neurologic Exam  Speech:    Speech is normal; fluent and spontaneous with normal comprehension.  Cognition:    The patient is oriented to person, place, and time;     recent and remote memory intact;     language fluent;     normal attention, concentration,     fund of knowledge Cranial Nerves:    The pupils are equal, round, and reactive to light. Pupils too small to visualize fundi.  Visual fields are full to finger confrontation. Extraocular movements are intact. Trigeminal sensation is intact and the muscles of mastication are normal. The face is symmetric. The palate elevates in the midline. Hearing impaired. Voice is normal. Shoulder shrug is normal. The tongue has normal motion without fasciculations.   Coordination:    Normal finger to nose and heel to shin. Normal rapid alternating movements.   Gait:    Heel-toe and tandem gait are normal.   Motor Observation:    No asymmetry, no atrophy, and no involuntary movements noted. Tone:    Normal muscle tone.    Posture:    Posture is normal. normal erect    Strength:    Strength is V/V in the upper and lower limbs.      Sensation: intact to LT     Reflex Exam:  DTR's:    Deep tendon reflexes in the upper and lower extremities are symmetrical bilaterally.   Toes:    The toes are equiv bilaterally.   Clonus:    Clonus is absent.    Assessment/Plan:  patient with chronic neck pain and cervicalgia causing occipital headache.Patient with chronic neck pain since at least 2006 (saw neurologist Dr. Joleen Navy at that time who performed an MRI with multi-level degenerative changes and facet arthropathy from c3-c4 to c5-c6). Will repeat MRI  cervical spine and likely refer for injections such as ESI or branch blocks/RFA. His syncope has been evaluated by cardiology and established to be vasovagal.    Send to Send to [facility]  special[facility] [address] [place]  [phone] For interventions such as medial branch blocks, espidural steroid injections, RFA for cervicalgia, occipital neuralgia, cervical degenerative disease pending MRI cervical spine repeat  MRI of the cervical spine and then likely physical therapy and injections into the neck  Physical Therapy  Take Gabapentin  as prescribed  Send to  [facility] [id number] [place]   Orders Placed This Encounter  Procedures   MR CERVICAL SPINE WO CONTRAST   Ambulatory referral to Physical Therapy   Ambulatory referral to Pain Clinic   No orders of the defined types were placed in this encounter.   Cc: Jenelle Mis, FNP,  Jenelle Mis, FNP  Aldona Amel, MD  [facility] [address] [place], [place] [address]  Phone [phone] Fax [phone]

## 2023-12-09 NOTE — Patient Instructions (Addendum)
 MRI of the cervical spine and then likely physical therapy and injections into the neck  Physical Therapy  Take Gabapentin  as prescribed  Send to [facility] [address] [place]   Send to Send to [facility] [address] [place]  [phone] For interventions such as medial branch blocks, espidural steroid injections, RFA for cervicalgia, due occipital neuralgia, cervical degenerative disease pending MRI cervical spine repeat

## 2023-12-13 ENCOUNTER — Telehealth: Payer: Self-pay

## 2023-12-13 NOTE — Telephone Encounter (Signed)
 Referral sent to [facility] Specialist - [facility] Location:  [address] [place], [place] [address]  Phone: [phone]  Fax: [phone]

## 2023-12-14 ENCOUNTER — Telehealth: Payer: Self-pay | Admitting: Neurology

## 2023-12-14 NOTE — Telephone Encounter (Signed)
 no auth required sent to GI [phone]

## 2023-12-25 ENCOUNTER — Ambulatory Visit
Admission: RE | Admit: 2023-12-25 | Discharge: 2023-12-25 | Disposition: A | Discharge status: Home / Self Care | Source: Ambulatory Visit | Attending: Neurology | Admitting: Neurology

## 2023-12-25 DIAGNOSIS — M542 Cervicalgia: Secondary | ICD-10-CM | POA: Diagnosis not present

## 2023-12-25 DIAGNOSIS — M47812 Spondylosis without myelopathy or radiculopathy, cervical region: Secondary | ICD-10-CM | POA: Diagnosis not present

## 2023-12-25 DIAGNOSIS — M5481 Occipital neuralgia: Secondary | ICD-10-CM

## 2023-12-25 DIAGNOSIS — G8929 Other chronic pain: Secondary | ICD-10-CM

## 2023-12-26 ENCOUNTER — Ambulatory Visit: Payer: Self-pay | Admitting: Neurology

## 2023-12-26 ENCOUNTER — Telehealth: Payer: Self-pay | Admitting: Neurology

## 2023-12-26 NOTE — Telephone Encounter (Signed)
 That is a benign finding thank you. I would ask his spine doctor if he can proceed to PT thank you

## 2023-12-26 NOTE — Telephone Encounter (Signed)
 Spoke with patient. He said he saw the results online. He already received his MRI CD and will take this to his appt with spine and scoliosis specialist tomorrow morning 6/10. I told him I would fax the report to them now. Patient is asking about the "Small retrocerebellar arachnoid cyst" on report. He also asked if Dr Tresia Fruit still felt he should proceed with physical therapy. I told him I would send the message to Dr Tresia Fruit and call him back. He was appreciative.  Faxed MRI cervical spine report to [facility], marked urgent. Received a receipt of confirmation.

## 2023-12-26 NOTE — Telephone Encounter (Signed)
 Please call Mr. Albin Huh, see phone note: MR. Luedke has moderate cervical spinal stenosis.This is where arthritic or disc changes can narrow the canal leading to pressure on the spinal cord. I think he should have this evaluated by spine specialits. At last appointment he was going to follow up with [facility] [address] [place] and if he has not seen them I recommend taking the MRI on disc(can go to [facility] and get a disk) with him to appointment but he should meet with a spine specialist/surgeon to review. Thank you

## 2023-12-26 NOTE — Progress Notes (Signed)
 Please call [name], see phone note: Gregory Eaton has moderate cervical spinal stenosis.This is where arthritic or disc changes can narrow the canal leading to pressure on the spinal cord. I think he should have this evaluated by spine specialits. At last appointment he was going to follow up with [facility] [address] [place] and if he has not seen them I recommend taking the MRI on disc(can go to [facility] and get a disk) with him to appointment but he should meet with a spine specialist/surgeon to review. Thank you

## 2023-12-26 NOTE — Telephone Encounter (Signed)
 Spoke with patient and relayed message from Dr. Tresia Fruit that the cyst is a benign finding and he should ask his spine doctor if he is okay to proceed with physical therapy.  The patient verbalized understanding.  He said he has a pending appointment with therapy on the 24th.

## 2023-12-27 DIAGNOSIS — M4802 Spinal stenosis, cervical region: Secondary | ICD-10-CM | POA: Diagnosis not present

## 2023-12-27 DIAGNOSIS — M4712 Other spondylosis with myelopathy, cervical region: Secondary | ICD-10-CM | POA: Diagnosis not present

## 2023-12-27 DIAGNOSIS — M542 Cervicalgia: Secondary | ICD-10-CM | POA: Diagnosis not present

## 2023-12-28 ENCOUNTER — Telehealth: Payer: Self-pay | Admitting: Cardiology

## 2023-12-28 ENCOUNTER — Telehealth: Payer: Self-pay

## 2023-12-28 DIAGNOSIS — M50121 Cervical disc disorder at C4-C5 level with radiculopathy: Secondary | ICD-10-CM | POA: Insufficient documentation

## 2023-12-28 DIAGNOSIS — M4802 Spinal stenosis, cervical region: Secondary | ICD-10-CM | POA: Insufficient documentation

## 2023-12-28 DIAGNOSIS — H01001 Unspecified blepharitis right upper eyelid: Secondary | ICD-10-CM | POA: Diagnosis not present

## 2023-12-28 DIAGNOSIS — Z961 Presence of intraocular lens: Secondary | ICD-10-CM | POA: Diagnosis not present

## 2023-12-28 DIAGNOSIS — H401122 Primary open-angle glaucoma, left eye, moderate stage: Secondary | ICD-10-CM | POA: Diagnosis not present

## 2023-12-28 DIAGNOSIS — M5000 Cervical disc disorder with myelopathy, unspecified cervical region: Secondary | ICD-10-CM | POA: Insufficient documentation

## 2023-12-28 DIAGNOSIS — M50022 Cervical disc disorder at C5-C6 level with myelopathy: Secondary | ICD-10-CM | POA: Diagnosis not present

## 2023-12-28 DIAGNOSIS — M50023 Cervical disc disorder at C6-C7 level with myelopathy: Secondary | ICD-10-CM | POA: Diagnosis not present

## 2023-12-28 DIAGNOSIS — H401111 Primary open-angle glaucoma, right eye, mild stage: Secondary | ICD-10-CM | POA: Diagnosis not present

## 2023-12-28 NOTE — Telephone Encounter (Signed)
   Name: Gregory Eaton  DOB: 1945/08/27  MRN: [id number]  Primary Cardiologist: Armida Lander, MD   Preoperative team, please contact this patient and set up a phone call appointment for further preoperative risk assessment. Please obtain consent and complete medication review. Thank you for your help.  I confirm that guidance regarding antiplatelet and oral anticoagulation therapy has been completed and, if necessary, noted below.  Pt has a history of CABG and stenting in [place] with [facility]. He may hold plavix  5-7 days as needed for this spinal surgery.  I also confirmed the patient resides in the state of [place] . As per [facility] Medical Board telemedicine laws, the patient must reside in the state in which the provider is licensed.   Warren Haber Derenda Giddings, PA 12/28/2023, 11:00 AM [facility]

## 2023-12-28 NOTE — Telephone Encounter (Signed)
   Pre-operative Risk Assessment    Patient Name: Gregory Eaton  DOB: 1946-06-14 MRN: [id number]      Request for Surgical Clearance    Procedure:  C5-6 and C6-7 ACDF  Date of Surgery:  Clearance TBD                                 Surgeon:  Cloteal Daniels, MD [facility] Surgeon's Group or Practice Name:  [facility] Phone number:  [phone] Fax number:  [phone]   Type of Clearance Requested:   - Medical  - Pharmacy:  Hold Clopidogrel  (Plavix ) Hold for 7 days   Type of Anesthesia:  General    Additional requests/questions:    Rosea Conch   12/28/2023, 10:36 AM

## 2023-12-28 NOTE — Telephone Encounter (Signed)
  Patient Consent for Virtual Visit        Gregory Eaton has provided verbal consent on 12/28/2023 for a virtual visit (video or telephone).   CONSENT FOR VIRTUAL VISIT FOR:  Gregory Eaton  By participating in this virtual visit I agree to the following:  I hereby voluntarily request, consent and authorize [facility] and its employed or contracted physicians, physician assistants, nurse practitioners or other licensed health care professionals (the Practitioner), to provide me with telemedicine health care services (the "Services) as deemed necessary by the treating Practitioner. I acknowledge and consent to receive the Services by the Practitioner via telemedicine. I understand that the telemedicine visit will involve communicating with the Practitioner through live audiovisual communication technology and the disclosure of certain medical information by electronic transmission. I acknowledge that I have been given the opportunity to request an in-person assessment or other available alternative prior to the telemedicine visit and am voluntarily participating in the telemedicine visit.  I understand that I have the right to withhold or withdraw my consent to the use of telemedicine in the course of my care at any time, without affecting my right to future care or treatment, and that the Practitioner or I may terminate the telemedicine visit at any time. I understand that I have the right to inspect all information obtained and/or recorded in the course of the telemedicine visit and may receive copies of available information for a reasonable fee.  I understand that some of the potential risks of receiving the Services via telemedicine include:  Delay or interruption in medical evaluation due to technological equipment failure or disruption; Information transmitted may not be sufficient (e.g. poor resolution of images) to allow for appropriate medical decision making by the Practitioner;  and/or  In rare instances, security protocols could fail, causing a breach of personal health information.  Furthermore, I acknowledge that it is my responsibility to provide information about my medical history, conditions and care that is complete and accurate to the best of my ability. I acknowledge that Practitioner's advice, recommendations, and/or decision may be based on factors not within their control, such as incomplete or inaccurate data provided by me or distortions of diagnostic images or specimens that may result from electronic transmissions. I understand that the practice of medicine is not an exact science and that Practitioner makes no warranties or guarantees regarding treatment outcomes. I acknowledge that a copy of this consent can be made available to me via my patient portal [facility] MyChart), or I can request a printed copy by calling the office of [facility].    I understand that my insurance will be billed for this visit.   I have read or had this consent read to me. I understand the contents of this consent, which adequately explains the benefits and risks of the Services being provided via telemedicine.  I have been provided ample opportunity to ask questions regarding this consent and the Services and have had my questions answered to my satisfaction. I give my informed consent for the services to be provided through the use of telemedicine in my medical care

## 2023-12-28 NOTE — Telephone Encounter (Signed)
 Preop clearance now scheduled, med rec and consent done

## 2023-12-30 ENCOUNTER — Encounter: Payer: Self-pay | Admitting: Student

## 2023-12-30 ENCOUNTER — Ambulatory Visit: Attending: Student | Admitting: Student

## 2023-12-30 VITALS — BP 122/60 | HR 65 | Ht 68.0 in | Wt 156.2 lb

## 2023-12-30 DIAGNOSIS — Z01810 Encounter for preprocedural cardiovascular examination: Secondary | ICD-10-CM | POA: Diagnosis not present

## 2023-12-30 DIAGNOSIS — I2510 Atherosclerotic heart disease of native coronary artery without angina pectoris: Secondary | ICD-10-CM | POA: Diagnosis not present

## 2023-12-30 DIAGNOSIS — I10 Essential (primary) hypertension: Secondary | ICD-10-CM | POA: Diagnosis not present

## 2023-12-30 DIAGNOSIS — E785 Hyperlipidemia, unspecified: Secondary | ICD-10-CM

## 2023-12-30 DIAGNOSIS — I6522 Occlusion and stenosis of left carotid artery: Secondary | ICD-10-CM

## 2023-12-30 DIAGNOSIS — I739 Peripheral vascular disease, unspecified: Secondary | ICD-10-CM | POA: Diagnosis not present

## 2023-12-30 DIAGNOSIS — R55 Syncope and collapse: Secondary | ICD-10-CM | POA: Diagnosis not present

## 2023-12-30 MED ORDER — ROSUVASTATIN CALCIUM 10 MG PO TABS: 10.0000 mg | ORAL_TABLET | Freq: Every day | ORAL | 3 refills | Status: AC

## 2023-12-30 MED ORDER — METOPROLOL SUCCINATE ER 50 MG PO TB24: 50.0000 mg | ORAL_TABLET | Freq: Every day | ORAL | 3 refills | Status: AC

## 2023-12-30 NOTE — Patient Instructions (Addendum)
 Medication Instructions:   Continue current medication regimen. We will check with Vascular Surgery to make sure Plavix  can be held 5 days prior to your surgery.   *If you need a refill on your cardiac medications before your next appointment, please call your pharmacy*  Follow-Up: At [facility], you and your health needs are our priority.  As part of our continuing mission to provide you with exceptional heart care, our providers are all part of one team.  This team includes your primary Cardiologist (physician) and Advanced Practice Providers or APPs (Physician Assistants and Nurse Practitioners) who all work together to provide you with the care you need, when you need it.  Your next appointment:   6 month(s)  Provider:   You may see Armida Lander, MD or one of the following Advanced Practice Providers on your designated Care Team:   Woodfin Hays, PA-C  Scotesia Aurora, [place] Theotis Flake, [place]     We recommend signing up for the patient portal called MyChart.  Sign up information is provided on this After Visit Summary.  MyChart is used to connect with patients for Virtual Visits (Telemedicine).  Patients are able to view lab/test results, encounter notes, upcoming appointments, etc.  Non-urgent messages can be sent to your provider as well.   To learn more about what you can do with MyChart, go to [email or web address].

## 2023-12-30 NOTE — Progress Notes (Signed)
 Cardiology Office Note    Date:  12/30/2023  ID:  Gregory, Eaton 08/13/1945, MRN [id number] Cardiologist: Armida Lander, MD    History of Present Illness:    Gregory Eaton is a 78 y.o. male  with past medical history of CAD (s/p CABG in 1998, low-risk NST in 07/2013 and 01/2021), PAD (s/p fem-pop bypass in 1993 and known occluded right SFA, right iliofemoral endarterectomy in 03/2021, left fem-pop bypass in 04/2021 and angioplasty of right external iliac artery in 08/2022), prior ablation at [facility] in the 1990's, HTN, HLD, carotid artery stenosis, COPD and history of esophageal cancer who presents to the office today for 84-month follow-up.  He was last examined by Dr. Amanda Jungling in 10/2023 following a recent ED visit for chest pain and syncope. Cardiac enzymes had been negative during workup. He had also experienced recurrent syncopal episodes which occurred in the setting of having an urge to have a bowel movement. Was consuming approximately 2-3 bottles of water  a day. Given his recent cardiac workup, further testing was not pursued at that time. His syncopal episodes were felt to be consistent with vasovagal syncope and he was encouraged to increase oral hydration and use compression stockings. Amlodipine  was reduced to 5 mg daily and it was recommended to accept a higher BP.  In the interim, the office did receive a preoperative clearance request earlier this month for medical and cardiac clearance in regards to C5-6 and C6-7 ACDF and was asked to hold Plavix  for 5 days.  In talking with the patient today, he reports overall doing well since his last office visit. Continues to have intermittent neck pain which radiates into his back. He denies any recurrent episodes of chest pain. Does stay active at baseline in doing yard work routinely. Denies any recurrent syncopal episodes resembling his prior event in 10/2023. Says that he did have 1 brief episode of unresponsiveness for a few seconds and  his wife said that his eyes might have rolled back in his head at that time but he regained consciousness very quickly. No associated chest pain or palpitations at that time. No reports of seizure-like activity or loss of bowel or bladder control. He has been keeping a detailed blood pressure log and readings have been well-controlled. His SBP was in the 90's on one check earlier this week but he had been working outside and had not consumed a lot of fluids per his report. Consumed a bottle of water  with improvement in BP and symptoms.  Studies Reviewed:   EKG: EKG is not ordered today. EKG from 09/12/2023 is reviewed and shows sinus tachycardia, HR 104 with known RBBB.   Echocardiogram: 12/2020 IMPRESSIONS     1. Left ventricular ejection fraction, by estimation, is 55 to 60%. The  left ventricle has normal function. The left ventricle has no regional  wall motion abnormalities. There is mild left ventricular hypertrophy.  Left ventricular diastolic parameters  are consistent with Grade I diastolic dysfunction (impaired relaxation).   2. Right ventricular systolic function is normal. The right ventricular  size is normal.   3. The mitral valve is normal in structure. No evidence of mitral valve  regurgitation. No evidence of mitral stenosis.   4. The aortic valve is tricuspid. There is mild calcification of the  aortic valve. There is mild thickening of the aortic valve. Aortic valve  regurgitation is not visualized. No aortic stenosis is present.   5. The inferior vena cava is normal in size  with greater than 50%  respiratory variability, suggesting right atrial pressure of 3 mmHg.   NST: 01/2021 The left ventricular ejection fraction is normal (55-65%). Nuclear stress EF: 65%. There was no ST segment deviation noted during stress. No T wave inversion was noted during stress. Defect 1: There is a small defect of mild severity present in the apical inferior location. Findings  consistent with a small region of ischemia. This is a low risk study.  Event Monitor: 04/2022   14 day monitor   Rare supraventricular ectopy in the form of isolated PACs, couplets, triplets. 3 runs of SVT the longest 5 beats   Rare ventricular ectopy in the form of isolated PVCs, couplets   Reported symptoms correlated with normal sinus rhythm   Physical Exam:   VS:  BP 122/60 (BP Location: Left Arm, Cuff Size: Normal)   Pulse 65   Ht 5' 8 (1.727 m)   Wt 156 lb 3.2 oz (70.9 kg)   SpO2 95%   BMI 23.75 kg/m    Wt Readings from Last 3 Encounters:  12/30/23 156 lb 3.2 oz (70.9 kg)  12/09/23 155 lb 9.6 oz (70.6 kg)  12/01/23 156 lb (70.8 kg)     GEN: Well nourished, well developed male appearing in no acute distress NECK: No JVD; No carotid bruits CARDIAC: RRR, no murmurs, rubs, gallops RESPIRATORY:  Clear to auscultation without rales, wheezing or rhonchi  ABDOMEN: Appears non-distended. No obvious abdominal masses. EXTREMITIES: No clubbing or cyanosis. No pitting edema.  Distal pedal pulses are 2+ bilaterally.   Assessment and Plan:   1. Coronary artery disease involving native coronary artery of native heart without angina pectoris - He underwent CABG in 1998 and most recent ischemic evaluation was a low-risk NST in 01/2021. He remains active at baseline in doing routine chores around his home and also doing yard work and denies any recent chest pain or dyspnea on exertion with this. - Remains on Plavix  75 mg daily (on this per vascular surgery), Toprol -XL 50 mg daily and Crestor  10 mg daily.  2. Syncope and collapse - Prior episodes felt to be most consistent with vasovagal syncope. He reports 1 brief episode as described above with an episode of unresponsiveness for less than a few seconds and no prodromal symptoms. This overall seems atypical for an arrhythmia. I encouraged him to make us  aware if he has recurrent events as a repeat monitor could be placed.  3. PAD  (peripheral artery disease) (HCC) - He has undergone femoropopliteal bypass as described above along with multiple interventions in the interim. Followed closely by Vascular Surgery and on Plavix  and statin therapy.  4. Left Carotid Artery Stenosis - Dopplers in 04/2023 showed 1-39% RICA stenosis and 40-59% LICA stenosis. He is already scheduled for follow-up dopplers in 05/2024. Remains on Plavix  and statin therapy.  5. Essential hypertension - BP is well-controlled at 122/60 during today's visit. Continue current medical therapy with Amlodipine  5 mg daily, Losartan  100 mg daily, Toprol -XL 50 mg daily and Spironolactone  12.5 mg daily.  6. Hyperlipidemia LDL goal <70 - LDL was at 59 in 06/2023. Continue current medical therapy with Crestor  10 mg daily.  7. Preoperative Cardiac Clearance for Spinal Surgery - He is able to perform more than 4 METS of activity and denies any anginal symptoms with this. RCRI risk is overall low at 6% risk of a major cardiac event. He does not require further cardiac testing prior to his upcoming procedure and is cleared to  proceed from our perspective. Will route today's note to the requesting provider.  - A request was sent to us  in regards to holding Plavix  for 5 days. He is on this per Vascular. Will route today's note to Vascular Surgery to make sure he can hold Plavix  for 5 days prior to his procedure.   Signed, Dorma Gash, PA-C

## 2024-01-03 ENCOUNTER — Other Ambulatory Visit: Payer: Self-pay | Admitting: Cardiology

## 2024-01-04 ENCOUNTER — Telehealth: Payer: Self-pay | Admitting: Student

## 2024-01-04 ENCOUNTER — Ambulatory Visit

## 2024-01-04 NOTE — Telephone Encounter (Signed)
 Patient notified of recommendations and verbalized understanding. Patient had no questions or concerns at this time.

## 2024-01-04 NOTE — Telephone Encounter (Signed)
    Please let the patient know I did hear back from Dr. Rosalva Comber with Vascular Surgery and he can hold Plavix  for 5 days prior to his procedure but needs to take ASA 81mg  daily during that timeframe. Will route today's note to Dr. Faylene Hoots as well.  Signed, Dorma Gash, PA-C 01/04/2024, 7:17 AM Pager: [phone]

## 2024-01-10 ENCOUNTER — Ambulatory Visit (HOSPITAL_COMMUNITY)

## 2024-01-16 ENCOUNTER — Encounter: Payer: Self-pay | Admitting: Family Medicine

## 2024-01-16 ENCOUNTER — Ambulatory Visit: Admitting: Family Medicine

## 2024-01-16 VITALS — BP 130/70 | HR 76 | Temp 97.7°F | Ht 68.0 in | Wt 156.0 lb

## 2024-01-16 DIAGNOSIS — R42 Dizziness and giddiness: Secondary | ICD-10-CM | POA: Diagnosis not present

## 2024-01-16 DIAGNOSIS — I10 Essential (primary) hypertension: Secondary | ICD-10-CM

## 2024-01-16 DIAGNOSIS — R7303 Prediabetes: Secondary | ICD-10-CM | POA: Diagnosis not present

## 2024-01-16 DIAGNOSIS — E785 Hyperlipidemia, unspecified: Secondary | ICD-10-CM | POA: Diagnosis not present

## 2024-01-16 MED ORDER — TRAZODONE HCL 50 MG PO TABS: 25.0000 mg | ORAL_TABLET | Freq: Every evening | ORAL | 1 refills | Status: AC | PRN

## 2024-01-16 NOTE — Assessment & Plan Note (Signed)
 Well controlled. Followed by cardiology. Continue Amlodipine  5mg  nightly, Losartan  100mg  daily, Aldactone  12.5mg  daily, and Metop Succinate 50mg  daily. Recommend heart healthy diet such as Mediterranean diet with whole grains, fruits, vegetable, fish, lean meats, nuts, and olive oil. Limit salt. Encouraged moderate walking, 3-5 times/week for 30-50 minutes each session. Aim for at least 150 minutes.week. Goal should be pace of 3 miles/hours, or walking 1.5 miles in 30 minutes. Avoid tobacco products. Avoid excess alcohol. Take medications as prescribed and bring medications and blood pressure log with cuff to each office visit. Seek medical care for chest pain, palpitations, shortness of breath with exertion, dizziness/lightheadedness, vision changes, recurrent headaches, or swelling of extremities. Follow up in 3 months or sooner if needed.

## 2024-01-16 NOTE — Assessment & Plan Note (Signed)
-

## 2024-01-16 NOTE — Assessment & Plan Note (Signed)
 Evaluated by neurology. MRI and CT showed cervical spondylosis for which he plans to have ACDF.

## 2024-01-16 NOTE — Progress Notes (Signed)
 Subjective:  HPI: Gregory Eaton is a 78 y.o. male presenting on 01/16/2024 for Medical Management of Chronic Issues (3 month HTN f/u )   HPI Patient is in today for HTN follow up. Home readings well controlled on Amlodipine  5mg  daily, Losartan  100mg  daily, Toprol -XL 50mg  daily, Spironolactone  12.5mg  daily. Permissive hypertension per cardiology Since our last visit Gregory Eaton has seen neurology for his dizziness and occipital headaches. CT and MRI were performed showing cervical spinal stenosis, PT was recommended and follow up with spine specialist. Ortho Surg recommended surgical consult and plan is to proceed with C3-C7 laminectomy and fusion with instrumentation. Has been cleared by Cardiology. Will hold Plavix  for 5 days prior.    HYPERTENSION without Chronic Kidney Disease Hypertension status: controlled  Satisfied with current treatment? yes Duration of hypertension: chronic BP monitoring frequency:  daily BP range: 120-144/70s BP medication side effects:  no Medication compliance: excellent compliance Previous BP meds: amlodipine , losartan , toprol -xl Aspirin : no Recurrent headaches: yes Visual changes: no Palpitations: no Dyspnea: no Chest pain: no Lower extremity edema: no Dizzy/lightheaded: yes  Per Cardiology 12/30/2023 for reference only: 1. Coronary artery disease involving native coronary artery of native heart without angina pectoris - He underwent CABG in 1998 and most recent ischemic evaluation was a low-risk NST in 01/2021. He remains active at baseline in doing routine chores around his home and also doing yard work and denies any recent chest pain or dyspnea on exertion with this. - Remains on Plavix  75 mg daily (on this per vascular surgery), Toprol -XL 50 mg daily and Crestor  10 mg daily.   2. Syncope and collapse - Prior episodes felt to be most consistent with vasovagal syncope. He reports 1 brief episode as described above with an episode of unresponsiveness  for less than a few seconds and no prodromal symptoms. This overall seems atypical for an arrhythmia. I encouraged him to make us  aware if he has recurrent events as a repeat monitor could be placed.   3. PAD (peripheral artery disease) (HCC) - He has undergone femoropopliteal bypass as described above along with multiple interventions in the interim. Followed closely by Vascular Surgery and on Plavix  and statin therapy.   4. Left Carotid Artery Stenosis - Dopplers in 04/2023 showed 1-39% RICA stenosis and 40-59% LICA stenosis. He is already scheduled for follow-up dopplers in 05/2024. Remains on Plavix  and statin therapy.   5. Essential hypertension - BP is well-controlled at 122/60 during today's visit. Continue current medical therapy with Amlodipine  5 mg daily, Losartan  100 mg daily, Toprol -XL 50 mg daily and Spironolactone  12.5 mg daily.   6. Hyperlipidemia LDL goal <70 - LDL was at 59 in 06/2023. Continue current medical therapy with Crestor  10 mg daily.   7. Preoperative Cardiac Clearance for Spinal Surgery - He is able to perform more than 4 METS of activity and denies any anginal symptoms with this. RCRI risk is overall low at 6% risk of a major cardiac event. He does not require further cardiac testing prior to his upcoming procedure and is cleared to proceed from our perspective. Will route today's note to the requesting provider.  - A request was sent to us  in regards to holding Plavix  for 5 days. He is on this per Vascular. Will route today's note to Vascular Surgery to make sure he can hold Plavix  for 5 days prior to his procedure.   Review of Systems  All other systems reviewed and are negative.   Relevant past medical history  reviewed and updated as indicated.   Past Medical History:  Diagnosis Date   Adenomatous colon polyp 03/2009   Last colonoscopy by Dr. Shaaron    Adenomatous polyp 2010   Adenomatous polyp of colon 11/03/2010   Arthritis    Barrett's esophagus     CAD (coronary artery disease)    Cataract    Complication of anesthesia    has a shortened esophogus due to CA.    COPD (chronic obstructive pulmonary disease) (HCC)    Severe emphysema per CT   Diverticulosis    Emphysema of lung (HCC)    Esophageal carcinoma (HCC) 03/2009   T1N1M0   GERD (gastroesophageal reflux disease)    Glaucoma    Heart murmur    History of Doppler ultrasound 11/09/2011   03/2014- 50-69% L ICA stenosis;carotid doppler; L bulb/prox ICA 0-49% diameter reduction; L vertebral artery - occlusive ds; L ECA  demonstrates severe amount of fibrous plaque   History of Doppler ultrasound 11/09/2011   LEAs; R ABI - mod art. insuff.; L ABI normal at rest; R SFA - occlusive ds; L SFA - occlusive ds; patent fem-pop graft   History of echocardiogram 08/27/2009   EF >55%   History of hiatal hernia    History of kidney stones 1965   History of nuclear stress test 11/24/2011   lexiscan ; normal perfusion; low risk scan; non-diagnostic for ischemia   Hyperlipidemia    Hypertension    Left carotid artery stenosis 04/08/2014   Pneumonia    Pre-diabetes    Pulmonary nodule, right 04/08/2014   2.8 mm-incidental finding on CT   PVD (peripheral vascular disease) (HCC)    Tachyarrhythmia 1999   Status post ablation at [facility]   Tobacco abuse      Past Surgical History:  Procedure Laterality Date   ABDOMINAL AORTOGRAM W/LOWER EXTREMITY N/A 04/01/2021   Procedure: ABDOMINAL AORTOGRAM W/LOWER EXTREMITY;  Surgeon: Gregory Fonda BRAVO, MD;  Location: [facility] INVASIVE CV LAB;  Service: Cardiovascular;  Laterality: N/A;   ABDOMINAL AORTOGRAM W/LOWER EXTREMITY N/A 09/01/2022   Procedure: ABDOMINAL AORTOGRAM W/LOWER EXTREMITY;  Surgeon: Gregory Fonda BRAVO, MD;  Location: [facility] INVASIVE CV LAB;  Service: Cardiovascular;  Laterality: N/A;   BIOPSY  10/24/2017   Procedure: BIOPSY;  Surgeon: Gregory Lamar HERO, MD;  Location: [facility];  Service: Endoscopy;;  esophagus   BIOPSY  10/22/2021   Procedure:  BIOPSY;  Surgeon: Gregory Lamar HERO, MD;  Location: [facility];  Service: Endoscopy;;   CATARACT EXTRACTION Bilateral    COLONOSCOPY  03/17/2009   Dr.Rourk- normal rectum, sigmoid diverticula, some pale sigmoid mucosa with diffuse petechiae. pedunculated polyp at the splenic flexure, remainder of colonic mucosa appeared normal. bx= adenomatous polyp   COLONOSCOPY  04/19/2003   Dr.Rehman- few diverticu;a at the sigmoid colon, 3 small polyps, one at the transverse colon and 2 at the sigmoid, small external hemorrhoids. bx report not available.    COLONOSCOPY WITH PROPOFOL  N/A 10/24/2017   Dr. Shaaron: Diverticulosis, 11 mm polyp at the ileocecal valve, tubular adenoma.  Repeat colonoscopy in 3 years   COLONOSCOPY WITH PROPOFOL  N/A 10/22/2021   Procedure: COLONOSCOPY WITH PROPOFOL ;  Surgeon: Gregory Lamar HERO, MD;  Location: [facility];  Service: Endoscopy;  Laterality: N/A;  11:00am   CORONARY ARTERY BYPASS GRAFT  1998   [name]   ENDARTERECTOMY FEMORAL Right 04/13/2021   Procedure: RIGHT ILIOFEMORAL ARTERY ENDARTERECTOMY WITH PATCH ANGIOPLASTY USING HEMASHIELD PALTINUM FINESSE DACRON PATCH;  Surgeon: Gregory Fonda BRAVO,  MD;  Location: [facility] OR;  Service: Vascular;  Laterality: Right;   ESOPHAGECTOMY  2010   [facility] Dr. [place]et   ESOPHAGOGASTRODUODENOSCOPY  11/25/2010   Dr.Rourk- s/p esophagectomy with gastric pull-up, esophageal erosions straddling the surgical anastomosis, salmon colored epithelium coming up a good centimeter to a centimeter and a half above the suture line, islands of salmon colored epithelium in the most poximal residual esophagus, remainder of gastric mucosa appeared normal. bx= swamous &gastric glandular mucosa w/chronic active inflammation   ESOPHAGOGASTRODUODENOSCOPY  03/17/2009   Dr.Rourk- 4cm segment of salmon-colored epithlium distal esophagus suspicious for barretts esophagus. area of suspicious nodularity w/in this segment bx seperately. small to moderate size hiatal  hernia, o/w normal stomach D1 and D2 bx=adenocarcinoma   ESOPHAGOGASTRODUODENOSCOPY (EGD) WITH PROPOFOL  N/A 10/24/2017   Dr. Shaaron: Remnant of esophagus with anastomosis with stomach at 24 cm from the incisors, 2 cm above the anastomosis he was found to have Barrett's but no dysplasia.  Advised for repeat EGD in 3 years   ESOPHAGOGASTRODUODENOSCOPY (EGD) WITH PROPOFOL  N/A 10/22/2021   Procedure: ESOPHAGOGASTRODUODENOSCOPY (EGD) WITH PROPOFOL ;  Surgeon: Gregory Lamar HERO, MD;  Location: [facility];  Service: Endoscopy;  Laterality: N/A;   EYE SURGERY     cataract   FEMORAL-POPLITEAL BYPASS GRAFT  1993   occlusive ds in R SFA   FEMORAL-POPLITEAL BYPASS GRAFT Left 05/11/2021   Procedure: LEFT FEMORAL-BELOW KNEE POPLITEAL ARTERY BYPASS GRAFT WITH 6 mm PTFE and COMPLETION ANGIOGRAM;  Surgeon: Gregory Fonda BRAVO, MD;  Location: [facility] OR;  Service: Vascular;  Laterality: Left;   GASTRIC PULL THROUGH  2010   With esophagectomy   INSERTION OF ILIAC STENT Bilateral 04/13/2021   Procedure: INSERTION OF BILATERAL COMMON ILIAC KISSING STENTS AND INSERION OF RIGHT EXTERNAL ILIAC STENT;  Surgeon: Gregory Fonda BRAVO, MD;  Location: [facility] OR;  Service: Vascular;  Laterality: Bilateral;   JOINT REPLACEMENT     LIPOMA EXCISION  2010   LOWER EXTREMITY ANGIOGRAM Left 04/13/2021   Procedure: LEFT LOWER EXTREMITY ANGIOGRAM ;  Surgeon: Gregory Fonda BRAVO, MD;  Location: [facility] OR;  Service: Vascular;  Laterality: Left;   PERIPHERAL VASCULAR INTERVENTION  04/01/2021   Procedure: PERIPHERAL VASCULAR INTERVENTION;  Surgeon: Gregory Fonda BRAVO, MD;  Location: [facility] INVASIVE CV LAB;  Service: Cardiovascular;;   PERIPHERAL VASCULAR INTERVENTION Right 09/01/2022   Procedure: PERIPHERAL VASCULAR INTERVENTION;  Surgeon: Gregory Fonda BRAVO, MD;  Location: [facility] INVASIVE CV LAB;  Service: Cardiovascular;  Laterality: Right;  right external iliac   POLYPECTOMY  10/24/2017   Procedure: POLYPECTOMY;  Surgeon: Gregory Lamar HERO, MD;  [facility];   Service: Endoscopy;;  colon    POLYPECTOMY  10/22/2021   Procedure: POLYPECTOMY;  Surgeon: Gregory Lamar HERO, MD;  Location: [facility];  Service: Endoscopy;;   TOTAL HIP ARTHROPLASTY Left 01/30/2019   Procedure: TOTAL HIP ARTHROPLASTY ANTERIOR APPROACH;  Surgeon: Beverley Evalene BIRCH, MD;  Location: [facility] ORS;  Service: Orthopedics;  Laterality: Left;   ULTRASOUND GUIDANCE FOR VASCULAR ACCESS Left 04/13/2021   Procedure: ULTRASOUND GUIDANCE FOR VASCULAR ACCESS, left femoral;  Surgeon: Gregory Fonda BRAVO, MD;  Location: [facility] OR;  Service: Vascular;  Laterality: Left;    Allergies and medications reviewed and updated.   Current Outpatient Medications:    acetaminophen  (TYLENOL ) 500 MG tablet, Take 1,000 mg by mouth daily as needed for headache., Disp: , Rfl:    albuterol  (PROVENTIL ) (2.5 MG/3ML) 0.083% nebulizer solution, USE ONE VIAL (2.5MG  TOTAL) IN NEBULIZER EVERY SIX HOURS AS NEEDED FOR WHEEZING  OR SHORTNESS OF BREATH, Disp: 75 mL, Rfl: 6   albuterol  (VENTOLIN  HFA) 108 (90 Base) MCG/ACT inhaler, Inhale 2 puffs into the lungs every 4 (four) hours as needed for wheezing or shortness of breath., Disp: 1 each, Rfl: 0   amLODipine  (NORVASC ) 5 MG tablet, Take 1 tablet (5 mg total) by mouth daily., Disp: 180 tablet, Rfl: 3   baclofen  (LIORESAL ) 10 MG tablet, Take 1 tablet (10 mg total) by mouth 3 (three) times daily as needed for muscle spasms., Disp: 20 each, Rfl: 0   brimonidine  (ALPHAGAN ) 0.2 % ophthalmic solution, SMARTSIG:In Eye(s), Disp: , Rfl:    clopidogrel  (PLAVIX ) 75 MG tablet, Take 1 tablet (75 mg total) by mouth daily., Disp: 90 tablet, Rfl: 3   fluticasone  (FLONASE) 50 MCG/ACT nasal spray, Place 1 spray into both nostrils daily., Disp: , Rfl:    gabapentin  (NEURONTIN ) 100 MG capsule, Take 1 capsule (100 mg total) by mouth 2 (two) times daily AND 2 capsules (200 mg total) at bedtime., Disp: 360 capsule, Rfl: 1   lansoprazole  (PREVACID ) 30 MG capsule, Take 1 capsule (30 mg total) by mouth daily  before supper., Disp: 90 capsule, Rfl: 3   latanoprost  (XALATAN ) 0.005 % ophthalmic solution, SMARTSIG:In Eye(s), Disp: , Rfl:    losartan  (COZAAR ) 100 MG tablet, Take 1 tablet (100 mg total) by mouth at bedtime., Disp: 90 tablet, Rfl: 3   metoprolol  succinate (TOPROL -XL) 50 MG 24 hr tablet, Take 1 tablet (50 mg total) by mouth daily. Take with or immediately following a meal., Disp: 90 tablet, Rfl: 3   montelukast  (SINGULAIR ) 10 MG tablet, Take 1 tablet (10 mg total) by mouth daily., Disp: 90 tablet, Rfl: 3   Multiple Vitamins-Minerals (CENTRUM SILVER PO), Take 1 tablet by mouth daily., Disp: , Rfl:    nitroGLYCERIN  (NITROSTAT ) 0.4 MG SL tablet, Place 1 tablet (0.4 mg total) under the tongue every 5 (five) minutes as needed for chest pain., Disp: 25 tablet, Rfl: 0   ondansetron  (ZOFRAN ) 4 MG tablet, Take 4 mg by mouth., Disp: , Rfl:    rosuvastatin  (CRESTOR ) 10 MG tablet, Take 1 tablet (10 mg total) by mouth daily., Disp: 90 tablet, Rfl: 3   spironolactone  (ALDACTONE ) 25 MG tablet, TAKE 0.5 TABLETS (12.5 MG TOTATL) BY MOUTH DAILY, Disp: 45 tablet, Rfl: 3   sucralfate  (CARAFATE ) 1 g tablet, Take 1 tablet (1 g total) by mouth 4 (four) times daily -  with meals and at bedtime. As needed for esophageal pain, heartburn. You can crush in couple of tablespoons of water  to make suspension., Disp: 120 tablet, Rfl: 5   umeclidinium-vilanterol (ANORO ELLIPTA ) 62.5-25 MCG/ACT AEPB, Inhale 1 puff into the lungs daily., Disp: 60 each, Rfl: 11   traZODone  (DESYREL ) 50 MG tablet, Take 0.5-1 tablets (25-50 mg total) by mouth at bedtime as needed for sleep., Disp: 90 tablet, Rfl: 1  Allergies  Allergen Reactions   Altace [Ramipril] Cough    Objective:   BP 130/70   Pulse 76   Temp 97.7 F (36.5 C)   Ht 5' 8 (1.727 m)   Wt 156 lb (70.8 kg)   SpO2 94%   BMI 23.72 kg/m      01/16/2024    7:58 AM 12/30/2023    1:21 PM 12/09/2023    8:54 AM  Vitals with BMI  Height 5' 8 5' 8 5' 8  Weight 156 lbs 156  lbs 3 oz 155 lbs 10 oz  BMI 23.73 23.76 23.66  Systolic 130 122  154  Diastolic 70 60 70  Pulse 76 65 78     Physical Exam Vitals and nursing note reviewed.  Constitutional:      Appearance: Normal appearance. He is normal weight.  HENT:     Head: Normocephalic and atraumatic.   Cardiovascular:     Rate and Rhythm: Normal rate and regular rhythm.     Pulses: Normal pulses.     Heart sounds: Normal heart sounds.  Pulmonary:     Effort: Pulmonary effort is normal.     Breath sounds: Normal breath sounds.   Skin:    General: Skin is warm and dry.     Capillary Refill: Capillary refill takes less than 2 seconds.   Neurological:     General: No focal deficit present.     Mental Status: He is alert and oriented to person, place, and time. Mental status is at baseline.   Psychiatric:        Mood and Affect: Mood normal.        Behavior: Behavior normal.        Thought Content: Thought content normal.        Judgment: Judgment normal.     Assessment & Plan:  Hyperlipidemia LDL goal <70 Assessment & Plan: Fasting labs today, continue Rosuvastatin . I recommend consuming a heart healthy diet such as Mediterranean diet or DASH diet with whole grains, fruits, vegetable, fish, lean meats, nuts, and olive oil. Limit sweets and processed foods. I also encourage moderate intensity exercise 150 minutes weekly. This is 3-5 times weekly for 30-50 minutes each session. Goal should be pace of 3 miles/hours, or walking 1.5 miles in 30 minutes. Follow up in 6 months or sooner if needed.   Orders: -     CBC with Differential/Platelet -     Comprehensive metabolic panel with GFR -     Lipid panel -     Hemoglobin A1c  Prediabetes Assessment & Plan: A1c today  Orders: -     CBC with Differential/Platelet -     Comprehensive metabolic panel with GFR -     Lipid panel -     Hemoglobin A1c  Essential hypertension Assessment & Plan: Well controlled. Followed by cardiology. Continue  Amlodipine  5mg  nightly, Losartan  100mg  daily, Aldactone  12.5mg  daily, and Metop Succinate 50mg  daily. Recommend heart healthy diet such as Mediterranean diet with whole grains, fruits, vegetable, fish, lean meats, nuts, and olive oil. Limit salt. Encouraged moderate walking, 3-5 times/week for 30-50 minutes each session. Aim for at least 150 minutes.week. Goal should be pace of 3 miles/hours, or walking 1.5 miles in 30 minutes. Avoid tobacco products. Avoid excess alcohol. Take medications as prescribed and bring medications and blood pressure log with cuff to each office visit. Seek medical care for chest pain, palpitations, shortness of breath with exertion, dizziness/lightheadedness, vision changes, recurrent headaches, or swelling of extremities. Follow up in 3 months or sooner if needed.  Orders: -     CBC with Differential/Platelet -     Comprehensive metabolic panel with GFR -     Lipid panel -     Hemoglobin A1c  Dizziness Assessment & Plan: Evaluated by neurology. MRI and CT showed cervical spondylosis for which he plans to have ACDF.    Other orders -     traZODone  HCl; Take 0.5-1 tablets (25-50 mg total) by mouth at bedtime as needed for sleep.  Dispense: 90 tablet; Refill: 1     Follow up plan: Return in about  3 months (around 04/17/2024) for chronic and surgical follow-up.  Jeoffrey GORMAN Barrio, FNP

## 2024-01-16 NOTE — Assessment & Plan Note (Signed)
 Fasting labs today, continue Rosuvastatin . I recommend consuming a heart healthy diet such as Mediterranean diet or DASH diet with whole grains, fruits, vegetable, fish, lean meats, nuts, and olive oil. Limit sweets and processed foods. I also encourage moderate intensity exercise 150 minutes weekly. This is 3-5 times weekly for 30-50 minutes each session. Goal should be pace of 3 miles/hours, or walking 1.5 miles in 30 minutes. Follow up in 6 months or sooner if needed.

## 2024-01-17 ENCOUNTER — Ambulatory Visit: Payer: Self-pay | Admitting: Family Medicine

## 2024-01-17 LAB — COMPREHENSIVE METABOLIC PANEL WITH GFR
AG Ratio: 1.8 (calc) (ref 1.0–2.5)
ALT: 18 U/L (ref 9–46)
AST: 16 U/L (ref 10–35)
Albumin: 4.4 g/dL (ref 3.6–5.1)
Alkaline phosphatase (APISO): 64 U/L (ref 35–144)
BUN: 13 mg/dL (ref 7–25)
CO2: 27 mmol/L (ref 20–32)
Calcium: 9.7 mg/dL (ref 8.6–10.3)
Chloride: 105 mmol/L (ref 98–110)
Creat: 0.87 mg/dL (ref 0.70–1.28)
Globulin: 2.5 g/dL (ref 1.9–3.7)
Glucose, Bld: 103 mg/dL — ABNORMAL HIGH (ref 65–99)
Potassium: 4.6 mmol/L (ref 3.5–5.3)
Sodium: 141 mmol/L (ref 135–146)
Total Bilirubin: 0.3 mg/dL (ref 0.2–1.2)
Total Protein: 6.9 g/dL (ref 6.1–8.1)
eGFR: 88 mL/min/{1.73_m2} (ref >=60)

## 2024-01-17 LAB — CBC WITH DIFFERENTIAL/PLATELET
Absolute Lymphocytes: 1836 {cells}/uL (ref 850–3900)
Absolute Monocytes: 775 {cells}/uL (ref 200–950)
Basophils Absolute: 61 {cells}/uL (ref 0–200)
Basophils Relative: 0.9 %
Eosinophils Absolute: 109 {cells}/uL (ref 15–500)
Eosinophils Relative: 1.6 %
HCT: 47.6 % (ref 38.5–50.0)
Hemoglobin: 15.2 g/dL (ref 13.2–17.1)
MCH: 29.5 pg (ref 27.0–33.0)
MCHC: 31.9 g/dL — ABNORMAL LOW (ref 32.0–36.0)
MCV: 92.2 fL (ref 80.0–100.0)
MPV: 9 fL (ref 7.5–12.5)
Monocytes Relative: 11.4 %
Neutro Abs: 4019 {cells}/uL (ref 1500–7800)
Neutrophils Relative %: 59.1 %
Platelets: 297 10*3/uL (ref 140–400)
RBC: 5.16 10*6/uL (ref 4.20–5.80)
RDW: 12.9 % (ref 11.0–15.0)
Total Lymphocyte: 27 %
WBC: 6.8 10*3/uL (ref 3.8–10.8)

## 2024-01-17 LAB — LIPID PANEL
Cholesterol: 115 mg/dL (ref <200)
HDL: 47 mg/dL (ref >=40)
LDL Cholesterol (Calc): 49 mg/dL
Non-HDL Cholesterol (Calc): 68 mg/dL (ref <130)
Total CHOL/HDL Ratio: 2.4 (calc) (ref <5.0)
Triglycerides: 104 mg/dL (ref <150)

## 2024-01-17 LAB — HEMOGLOBIN A1C
Hgb A1c MFr Bld: 6.3 % — ABNORMAL HIGH (ref <5.7)
Mean Plasma Glucose: 134 mg/dL
eAG (mmol/L): 7.4 mmol/L

## 2024-01-24 DIAGNOSIS — X32XXXA Exposure to sunlight, initial encounter: Secondary | ICD-10-CM | POA: Diagnosis not present

## 2024-01-24 DIAGNOSIS — L570 Actinic keratosis: Secondary | ICD-10-CM | POA: Diagnosis not present

## 2024-01-24 DIAGNOSIS — L821 Other seborrheic keratosis: Secondary | ICD-10-CM | POA: Diagnosis not present

## 2024-01-24 NOTE — Progress Notes (Signed)
 First attempt. To call and inform pt. No answer. Lvm for call back.

## 2024-01-26 NOTE — Telephone Encounter (Signed)
 Pt. Returned call. Understood he is making progress in the correct direction. And there will be no changes at this time.

## 2024-03-09 DIAGNOSIS — H5200 Hypermetropia, unspecified eye: Secondary | ICD-10-CM | POA: Diagnosis not present

## 2024-03-09 DIAGNOSIS — H401123 Primary open-angle glaucoma, left eye, severe stage: Secondary | ICD-10-CM | POA: Diagnosis not present

## 2024-03-21 ENCOUNTER — Ambulatory Visit

## 2024-03-21 VITALS — Ht 68.0 in | Wt 156.0 lb

## 2024-03-21 DIAGNOSIS — Z0000 Encounter for general adult medical examination without abnormal findings: Secondary | ICD-10-CM

## 2024-03-21 NOTE — Patient Instructions (Signed)
 [name] , Thank you for taking time out of your busy schedule to complete your Annual Wellness Visit with me. I enjoyed our conversation and look forward to speaking with you again next year. I, as well as your care team,  appreciate your ongoing commitment to your health goals. Please review the following plan we discussed and let me know if I can assist you in the future. Your Game plan/ To Do List      Follow up Visits: We will see or speak with you next year for your Next Medicare AWV with our clinical staff Have you seen your provider in the last 6 months (3 months if uncontrolled diabetes)? Yes  Clinician Recommendations:  Aim for 30 minutes of exercise or brisk walking, 6-8 glasses of water , and 5 servings of fruits and vegetables each day.       This is a list of the screenings recommended for you:  Health Maintenance  Topic Date Due   Eye exam for diabetics  Never done   Hepatitis C Screening  Never done   Zoster (Shingles) Vaccine (1 of 2) Never done   Flu Shot  02/17/2024   COVID-19 Vaccine (6 - 2025-26 season) 03/19/2024   DTaP/Tdap/Td vaccine (2 - Tdap) 01/15/2025*   Hemoglobin A1C  07/17/2024   Yearly kidney health urinalysis for diabetes  07/18/2024   Complete foot exam   09/26/2024   Yearly kidney function blood test for diabetes  01/15/2025   Medicare Annual Wellness Visit  03/21/2025   Pneumococcal Vaccine for age over 49  Completed   HPV Vaccine  Aged Out   Meningitis B Vaccine  Aged Out   Colon Cancer Screening  Discontinued  *Topic was postponed. The date shown is not the original due date.    Advanced directives: (ACP Link)Information on Advanced Care Planning can be found at [place]  Secretary of [facility] Advance Health Care Directives Advance Health Care Directives. [email or web address]   Advance Care Planning is important because it:  [x]  Makes sure you receive the medical care that is consistent with your values, goals, and preferences  [x]  It provides  guidance to your family and loved ones and reduces their decisional burden about whether or not they are making the right decisions based on your wishes.  Follow the link provided in your after visit summary or read over the paperwork we have mailed to you to help you started getting your Advance Directives in place. If you need assistance in completing these, please reach out to us  so that we can help you!  See attachments for Preventive Care and Fall Prevention Tips.

## 2024-03-21 NOTE — Progress Notes (Signed)
 Subjective:   Gregory Eaton is a 78 y.o. who presents for a Medicare Wellness preventive visit.  As a reminder, Annual Wellness Visits don't include a physical exam, and some assessments may be limited, especially if this visit is performed virtually. We may recommend an in-person follow-up visit with your provider if needed.  Visit Complete: Virtual I connected with  Carlin LELON Hurst on 03/21/24 by a audio enabled telemedicine application and verified that I am speaking with the correct person using two identifiers.  Patient Location: Home  Provider Location: Home Office  I discussed the limitations of evaluation and management by telemedicine. The patient expressed understanding and agreed to proceed.  Vital Signs: Because this visit was a virtual/telehealth visit, some criteria may be missing or patient reported. Any vitals not documented were not able to be obtained and vitals that have been documented are patient reported.  VideoDeclined- This patient declined Librarian, academic. Therefore the visit was completed with audio only.  Persons Participating in Visit: Patient.  AWV Questionnaire: No: Patient Medicare AWV questionnaire was not completed prior to this visit.  Cardiac Risk Factors include: advanced age (>23men, >39 women);dyslipidemia;hypertension;male gender     Objective:    Today's Vitals   03/21/24 0852  Weight: 156 lb (70.8 kg)  Height: 5' 8 (1.727 m)   Body mass index is 23.72 kg/m.     03/21/2024    9:08 AM 09/12/2023   12:22 PM 09/01/2022    9:33 AM 10/22/2021    9:20 AM 10/20/2021    8:45 AM 08/15/2021    8:54 AM 05/11/2021    7:49 AM  Advanced Directives  Does Patient Have a Medical Advance Directive? No No No No No No No  Would patient like information on creating a medical advance directive? Yes (MAU/Ambulatory/Procedural Areas - Information given) No - Patient declined No - Patient declined No - Patient declined No -  Patient declined No - Patient declined No - Patient declined    Current Medications (verified) Outpatient Encounter Medications as of 03/21/2024  Medication Sig   acetaminophen  (TYLENOL ) 500 MG tablet Take 1,000 mg by mouth daily as needed for headache.   albuterol  (PROVENTIL ) (2.5 MG/3ML) 0.083% nebulizer solution USE ONE VIAL (2.5MG  TOTAL) IN NEBULIZER EVERY SIX HOURS AS NEEDED FOR WHEEZING OR SHORTNESS OF BREATH   albuterol  (VENTOLIN  HFA) 108 (90 Base) MCG/ACT inhaler Inhale 2 puffs into the lungs every 4 (four) hours as needed for wheezing or shortness of breath.   amLODipine  (NORVASC ) 5 MG tablet Take 1 tablet (5 mg total) by mouth daily.   baclofen  (LIORESAL ) 10 MG tablet Take 1 tablet (10 mg total) by mouth 3 (three) times daily as needed for muscle spasms.   brimonidine  (ALPHAGAN ) 0.2 % ophthalmic solution SMARTSIG:In Eye(s)   clopidogrel  (PLAVIX ) 75 MG tablet Take 1 tablet (75 mg total) by mouth daily.   fluticasone  (FLONASE) 50 MCG/ACT nasal spray Place 1 spray into both nostrils daily.   gabapentin  (NEURONTIN ) 100 MG capsule Take 1 capsule (100 mg total) by mouth 2 (two) times daily AND 2 capsules (200 mg total) at bedtime.   lansoprazole  (PREVACID ) 30 MG capsule Take 1 capsule (30 mg total) by mouth daily before supper.   latanoprost  (XALATAN ) 0.005 % ophthalmic solution SMARTSIG:In Eye(s)   losartan  (COZAAR ) 100 MG tablet Take 1 tablet (100 mg total) by mouth at bedtime.   metoprolol  succinate (TOPROL -XL) 50 MG 24 hr tablet Take 1 tablet (50 mg total) by mouth  daily. Take with or immediately following a meal.   montelukast  (SINGULAIR ) 10 MG tablet Take 1 tablet (10 mg total) by mouth daily.   Multiple Vitamins-Minerals (CENTRUM SILVER PO) Take 1 tablet by mouth daily.   nitroGLYCERIN  (NITROSTAT ) 0.4 MG SL tablet Place 1 tablet (0.4 mg total) under the tongue every 5 (five) minutes as needed for chest pain.   ondansetron  (ZOFRAN ) 4 MG tablet Take 4 mg by mouth.   penicillin v  potassium (VEETID) 500 MG tablet Take 500 mg by mouth 3 (three) times daily.   rosuvastatin  (CRESTOR ) 10 MG tablet Take 1 tablet (10 mg total) by mouth daily.   spironolactone  (ALDACTONE ) 25 MG tablet TAKE 0.5 TABLETS (12.5 MG TOTATL) BY MOUTH DAILY   sucralfate  (CARAFATE ) 1 g tablet Take 1 tablet (1 g total) by mouth 4 (four) times daily -  with meals and at bedtime. As needed for esophageal pain, heartburn. You can crush in couple of tablespoons of water  to make suspension.   traZODone  (DESYREL ) 50 MG tablet Take 0.5-1 tablets (25-50 mg total) by mouth at bedtime as needed for sleep.   umeclidinium-vilanterol (ANORO ELLIPTA ) 62.5-25 MCG/ACT AEPB Inhale 1 puff into the lungs daily.   No facility-administered encounter medications on file as of 03/21/2024.    Allergies (verified) Altace [ramipril]   History: Past Medical History:  Diagnosis Date   Adenomatous colon polyp 03/2009   Last colonoscopy by Dr. Shaaron    Adenomatous polyp 2010   Adenomatous polyp of colon 11/03/2010   Arthritis    Barrett's esophagus    CAD (coronary artery disease)    Cataract    Complication of anesthesia    has a shortened esophogus due to CA.    COPD (chronic obstructive pulmonary disease) (HCC)    Severe emphysema per CT   Diverticulosis    Emphysema of lung (HCC)    Esophageal carcinoma (HCC) 03/2009   T1N1M0   GERD (gastroesophageal reflux disease)    Glaucoma    Heart murmur    History of Doppler ultrasound 11/09/2011   03/2014- 50-69% L ICA stenosis;carotid doppler; L bulb/prox ICA 0-49% diameter reduction; L vertebral artery - occlusive ds; L ECA  demonstrates severe amount of fibrous plaque   History of Doppler ultrasound 11/09/2011   LEAs; R ABI - mod art. insuff.; L ABI normal at rest; R SFA - occlusive ds; L SFA - occlusive ds; patent fem-pop graft   History of echocardiogram 08/27/2009   EF >55%   History of hiatal hernia    History of kidney stones 1965   History of nuclear stress test  11/24/2011   lexiscan ; normal perfusion; low risk scan; non-diagnostic for ischemia   Hyperlipidemia    Hypertension    Left carotid artery stenosis 04/08/2014   Pneumonia    Pre-diabetes    Pulmonary nodule, right 04/08/2014   2.8 mm-incidental finding on CT   PVD (peripheral vascular disease) (HCC)    Tachyarrhythmia 1999   Status post ablation at [facility]   Tobacco abuse    Past Surgical History:  Procedure Laterality Date   ABDOMINAL AORTOGRAM W/LOWER EXTREMITY N/A 04/01/2021   Procedure: ABDOMINAL AORTOGRAM W/LOWER EXTREMITY;  Surgeon: Lanis Fonda BRAVO, MD;  Location: [facility] INVASIVE CV LAB;  Service: Cardiovascular;  Laterality: N/A;   ABDOMINAL AORTOGRAM W/LOWER EXTREMITY N/A 09/01/2022   Procedure: ABDOMINAL AORTOGRAM W/LOWER EXTREMITY;  Surgeon: Lanis Fonda BRAVO, MD;  Location: [facility] INVASIVE CV LAB;  Service: Cardiovascular;  Laterality: N/A;   BIOPSY  10/24/2017   Procedure: BIOPSY;  Surgeon: Shaaron Lamar HERO, MD;  Location: [facility] ENDO SU[facility];  Service: Endoscopy;;  esophagus   BIOPSY  10/22/2021   Procedure: BIOPSY;  Surgeon: Shaaron Lamar HERO, MD;  Location: [facility];  Service: Endoscopy;;   CATARACT EXTRACTION Bilateral    COLONOSCOPY  03/17/2009   Dr.Rourk- normal rectum, sigmoid diverticula, some pale sigmoid mucosa with diffuse petechiae. pedunculated polyp at the splenic flexure, remainder of colonic mucosa appeared normal. bx= adenomatous polyp   COLONOSCOPY  04/19/2003   Dr.Rehman- few diverticu;a at the sigmoid colon, 3 small polyps, one at the transverse colon and 2 at the sigmoid, small external hemorrhoids. bx report not available.    COLONOSCOPY WITH PROPOFOL  N/A 10/24/2017   Dr. Shaaron: Diverticulosis, 11 mm polyp at the ileocecal valve, tubular adenoma.  Repeat colonoscopy in 3 years   COLONOSCOPY WITH PROPOFOL  N/A 10/22/2021   Procedure: COLONOSCOPY WITH PROPOFOL ;  Surgeon: Shaaron Lamar HERO, MD;  [facility]: [facility];  Service: Endoscopy;  Laterality: N/A;  11:00am    CORONARY ARTERY BYPASS GRAFT  1998   [name]   ENDARTERECTOMY FEMORAL Right 04/13/2021   Procedure: RIGHT ILIOFEMORAL ARTERY ENDARTERECTOMY WITH PATCH ANGIOPLASTY USING HEMASHIELD PALTINUM FINESSE DACRON PATCH;  Surgeon: Lanis Fonda BRAVO, MD;  Location: [facility] OR;  Service: Vascular;  Laterality: Right;   ESOPHAGECTOMY  2010   [facility] Dr. Garnet   ESOPHAGOGASTRODUODENOSCOPY  11/25/2010   Dr.Rourk- s/p esophagectomy with gastric pull-up, esophageal erosions straddling the surgical anastomosis, salmon colored epithelium coming up a good centimeter to a centimeter and a half above the suture line, islands of salmon colored epithelium in the most poximal residual esophagus, remainder of gastric mucosa appeared normal. bx= swamous &gastric glandular mucosa w/chronic active inflammation   ESOPHAGOGASTRODUODENOSCOPY  03/17/2009   Dr.Rourk- 4cm segment of salmon-colored epithlium distal esophagus suspicious for barretts esophagus. area of suspicious nodularity w/in this segment bx seperately. small to moderate size hiatal hernia, o/w normal stomach D1 and D2 bx=adenocarcinoma   ESOPHAGOGASTRODUODENOSCOPY (EGD) WITH PROPOFOL  N/A 10/24/2017   Dr. Shaaron: Remnant of esophagus with anastomosis with stomach at 24 cm from the incisors, 2 cm above the anastomosis he was found to have Barrett's but no dysplasia.  Advised for repeat EGD in 3 years   ESOPHAGOGASTRODUODENOSCOPY (EGD) WITH PROPOFOL  N/A 10/22/2021   Procedure: ESOPHAGOGASTRODUODENOSCOPY (EGD) WITH PROPOFOL ;  Surgeon: Shaaron Lamar HERO, MD;  [facility];  Service: Endoscopy;  Laterality: N/A;   EYE SURGERY     cataract   FEMORAL-POPLITEAL BYPASS GRAFT  1993   occlusive ds in R SFA   FEMORAL-POPLITEAL BYPASS GRAFT Left 05/11/2021   Procedure: LEFT FEMORAL-BELOW KNEE POPLITEAL ARTERY BYPASS GRAFT WITH 6 mm PTFE and COMPLETION ANGIOGRAM;  Surgeon: Lanis Fonda BRAVO, MD;  Location: [facility] OR;  Service: Vascular;  Laterality: Left;   GASTRIC PULL  THROUGH  2010   With esophagectomy   INSERTION OF ILIAC STENT Bilateral 04/13/2021   Procedure: INSERTION OF BILATERAL COMMON ILIAC KISSING STENTS AND INSERION OF RIGHT EXTERNAL ILIAC STENT;  Surgeon: Lanis Fonda BRAVO, MD;  Location: [facility] OR;  Service: Vascular;  Laterality: Bilateral;   JOINT REPLACEMENT     LIPOMA EXCISION  2010   LOWER EXTREMITY ANGIOGRAM Left 04/13/2021   Procedure: LEFT LOWER EXTREMITY ANGIOGRAM ;  Surgeon: Lanis Fonda BRAVO, MD;  Location: [facility] OR;  Service: Vascular;  Laterality: Left;   PERIPHERAL VASCULAR INTERVENTION  04/01/2021   Procedure: PERIPHERAL VASCULAR INTERVENTION;  Surgeon: Lanis Fonda BRAVO, MD;  Location: [facility] INVASIVE CV LAB;  Service: Cardiovascular;;   PERIPHERAL VASCULAR INTERVENTION Right 09/01/2022   Procedure: PERIPHERAL VASCULAR INTERVENTION;  Surgeon: Lanis Fonda BRAVO, MD;  [facility] INVASIVE CV LAB;  Service: Cardiovascular;  Laterality: Right;  right external iliac   POLYPECTOMY  10/24/2017   Procedure: POLYPECTOMY;  Surgeon: Shaaron Lamar HERO, MD;  [facility];  Service: Endoscopy;;  colon    POLYPECTOMY  10/22/2021   Procedure: POLYPECTOMY;  Surgeon: Shaaron Lamar HERO, MD;  [facility];  Service: Endoscopy;;   TOTAL HIP ARTHROPLASTY Left 01/30/2019   Procedure: TOTAL HIP ARTHROPLASTY ANTERIOR APPROACH;  Surgeon: Beverley Evalene BIRCH, MD;  Location: [facility] ORS;  Service: Orthopedics;  Laterality: Left;   ULTRASOUND GUIDANCE FOR VASCULAR ACCESS Left 04/13/2021   Procedure: ULTRASOUND GUIDANCE FOR VASCULAR ACCESS, left femoral;  Surgeon: Lanis Fonda BRAVO, MD;  Location: [facility] OR;  Service: Vascular;  Laterality: Left;   Family History  Problem Relation Age of Onset   Stroke Mother    GER disease Mother    Congenital heart disease Sister    Coronary artery disease Brother    Colon cancer Neg Hx    Social History   Socioeconomic History   Marital status: Married    Spouse name: Not on file   Number of children: 2   Years of education:  Not on file   Highest education level: 8th grade  Occupational History   Occupation: retired    Associate Professor: RETIRED    Comment: truck driver  Tobacco Use   Smoking status: Former    Current packs/day: 0.00    Average packs/day: 2.0 packs/day for 40.0 years (80.0 ttl pk-yrs)    Types: Cigarettes    Start date: 43    Quit date: 07/20/1995    Years since quitting: 28.6    Passive exposure: Never   Smokeless tobacco: Never  Vaping Use   Vaping status: Never Used  Substance and Sexual Activity   Alcohol use: No    Alcohol/week: 0.0 standard drinks of alcohol   Drug use: No   Sexual activity: Yes    Partners: Female    Birth control/protection: Condom    Comment: friend  Other Topics Concern   Not on file  Social History Narrative   Not on file   Social Drivers of Health   Financial Resource Strain: Low Risk  (03/21/2024)   Overall Financial Resource Strain (CARDIA)    Difficulty of Paying Living Expenses: Not very hard  Food Insecurity: No Food Insecurity (03/21/2024)   Hunger Vital Sign    Worried About Running Out of Food in the Last Year: Never true    Ran Out of Food in the Last Year: Never true  Transportation Needs: No Transportation Needs (03/21/2024)   PRAPARE - Administrator, Civil Service (Medical): No    Lack of Transportation (Non-Medical): No  Physical Activity: Insufficiently Active (03/21/2024)   Exercise Vital Sign    Days of Exercise per Week: 3 days    Minutes of Exercise per Session: 30 min  Stress: No Stress Concern Present (03/21/2024)   [employer] of Occupational Health - Occupational Stress Questionnaire    Feeling of Stress: Only a little  Social Connections: Moderately Integrated (03/21/2024)   Social Connection and Isolation Panel    Frequency of Communication with Friends and Family: Three times a week    Frequency of Social Gatherings with Friends and Family: Once a week    Attends Religious  Services: More than 4 times per year     Active Member of Clubs or Organizations: No    Attends Banker Meetings: Never    Marital Status: Married    Tobacco Counseling Counseling given: Not Answered    Clinical Intake:  Pre-visit preparation completed: Yes  Pain : No/denies pain  Diabetes: No  Lab Results  Component Value Date   HGBA1C 6.3 (H) 01/16/2024   HGBA1C 6.5 (H) 07/19/2023   HGBA1C 6.5 (H) 04/09/2021     How often do you need to have someone help you when you read instructions, pamphlets, or other written materials from your doctor or pharmacy?: 1 - Never  Interpreter Needed?: No  Information entered by :: Charmaine Bloodgood LPN   Activities of Daily Living     03/21/2024    9:04 AM  In your present state of health, do you have any difficulty performing the following activities:  Hearing? 0  Vision? 0  Difficulty concentrating or making decisions? 0  Walking or climbing stairs? 0  Dressing or bathing? 0  Doing errands, shopping? 0  Preparing Food and eating ? N  Using the Toilet? N  In the past six months, have you accidently leaked urine? N  Do you have problems with loss of bowel control? N  Managing your Medications? N  Managing your Finances? N  Housekeeping or managing your Housekeeping? N    Patient Care Team: Kayla Jeoffrey RAMAN, FNP as PCP - General (Family Medicine) Alvan Dorn FALCON, MD as PCP - Cardiology (Cardiology) Shaaron Lamar HERO, MD (Gastroenterology) Lanis Fonda BRAVO, MD as Consulting Physician (Vascular Surgery) Ines Onetha NOVAK, MD (Neurology) Gust Royden ORN, MD as Consulting Physician (Orthopedic Surgery) Darlean Ozell NOVAK, MD as Consulting Physician (Pulmonary Disease) Juli Blunt, MD as Consulting Physician (Ophthalmology) Dr Willma [facility]ats [facility], OD as Referring Physician (Osteopathic Medicine)  I have updated your Care Teams any recent Medical Services you may have received from other providers in the past year.     Assessment:   This is a  routine wellness examination for [place].  Hearing/Vision screen Hearing Screening - Comments:: Denies hearing difficulties   Vision Screening - Comments:: Wears rx glasses - up to date with routine eye exams with Dr. Willma Moats and Dr. Glynis    Goals Addressed             This Visit's Progress    Maintain health and independence   On track    Recover from upcoming surgery        Depression Screen     03/21/2024    9:07 AM 10/12/2023    9:56 AM 07/19/2023    9:57 AM 03/25/2021   10:07 AM 03/23/2016    8:59 AM  PHQ 2/9 Scores  PHQ - 2 Score 0 0 0 0 0    Fall Risk     03/21/2024    9:08 AM 10/12/2023    9:56 AM 07/19/2023    9:56 AM 03/25/2021   10:07 AM 03/23/2016    8:59 AM  Fall Risk   Falls in the past year? 0 0 0 0 No   Number falls in past yr: 0 0 0    Injury with Fall? 0 0 0    Risk for fall due to : No Fall Risks No Fall Risks     Follow up Falls prevention discussed;Education provided;Falls evaluation completed Falls prevention discussed;Falls evaluation completed        Data saved  with a previous flowsheet row definition    MEDICARE RISK AT HOME:  Medicare Risk at Home Any stairs in or around the home?: Yes If so, are there any without handrails?: No Home free of loose throw rugs in walkways, pet beds, electrical cords, etc?: Yes Adequate lighting in your home to reduce risk of falls?: Yes Life alert?: No Use of a cane, walker or w/c?: No Grab bars in the bathroom?: Yes Shower chair or bench in shower?: No Elevated toilet seat or a handicapped toilet?: Yes  TIMED UP AND GO:  Was the test performed?  No  Cognitive Function: 6CIT completed        03/21/2024    9:08 AM  6CIT Screen  What Year? 0 points  What month? 0 points  What time? 0 points  Count back from 20 0 points  Months in reverse 0 points  Repeat phrase 0 points  Total Score 0 points    Immunizations Immunization History  Administered Date(s) Administered   Fluad Quad(high Dose  65+) 04/23/2019, 04/20/2021   Fluad Trivalent(High Dose 65+) 07/19/2023   INFLUENZA, HIGH DOSE SEASONAL PF 04/19/2015, 04/17/2016, 05/09/2017, 04/21/2018, 04/19/2019   Influenza Inj Mdck Quad Pf 04/17/2016   Influenza Split 05/29/2010   Influenza, Quadrivalent, Recombinant, Inj, Pf 04/17/2016, 02/17/2020   Influenza,inj,Quad PF,6+ Mos 04/19/2015, 04/17/2016   Influenza,inj,quad, With Preservative 02/17/2020   Influenza,trivalent, recombinat, inj, PF 05/29/2010   Influenza-Unspecified 05/29/2010, 05/01/2014   Moderna Sars-Covid-2 Vaccination 08/23/2019, 09/21/2019, 05/16/2020, 07/09/2020, 12/26/2020   Pneumococcal Conjugate-13 04/01/2014, 04/01/2016   Pneumococcal Polysaccharide-23 04/17/2016   Respiratory Syncytial Virus Vaccine ,Recomb Aduvanted(Arexvy ) 04/11/2023   Td 05/17/2012    Screening Tests Health Maintenance  Topic Date Due   OPHTHALMOLOGY EXAM  Never done   Hepatitis C Screening  Never done   Zoster Vaccines- Shingrix (1 of 2) Never done   INFLUENZA VACCINE  02/17/2024   COVID-19 Vaccine (6 - 2025-26 season) 03/19/2024   DTaP/Tdap/Td (2 - Tdap) 01/15/2025 (Originally 05/17/2022)   HEMOGLOBIN A1C  07/17/2024   Diabetic kidney evaluation - Urine ACR  07/18/2024   FOOT EXAM  09/26/2024   Diabetic kidney evaluation - eGFR measurement  01/15/2025   Medicare Annual Wellness (AWV)  03/21/2025   Pneumococcal Vaccine: 50+ Years  Completed   HPV VACCINES  Aged Out   Meningococcal B Vaccine  Aged Out   Colonoscopy  Discontinued    Health Maintenance  Health Maintenance Due  Topic Date Due   OPHTHALMOLOGY EXAM  Never done   Hepatitis C Screening  Never done   Zoster Vaccines- Shingrix (1 of 2) Never done   INFLUENZA VACCINE  02/17/2024   COVID-19 Vaccine (6 - 2025-26 season) 03/19/2024   Health Maintenance Items Addressed: Information provided on recommended vaccines ; Requesting records for last diabetic eye exam    Additional Screening:  Vision Screening:  Recommended annual ophthalmology exams for early detection of glaucoma and other disorders of the eye. Would you like a referral to an eye doctor? No    Dental Screening: Recommended annual dental exams for proper oral hygiene  Community Resource Referral / Chronic Care Management: CRR required this visit?  No   CCM required this visit?  No   Plan:    I have personally reviewed and noted the following in the patient's chart:   Medical and social history Use of alcohol, tobacco or illicit drugs  Current medications and supplements including opioid prescriptions. Patient is not currently taking opioid prescriptions. Functional ability and status Nutritional  status Physical activity Advanced directives List of other physicians Hospitalizations, surgeries, and ER visits in previous 12 months Vitals Screenings to include cognitive, depression, and falls Referrals and appointments  In addition, I have reviewed and discussed with patient certain preventive protocols, quality metrics, and best practice recommendations. A written personalized care plan for preventive services as well as general preventive health recommendations were provided to patient.   Lavelle Pfeiffer [place], [place]   0/12/7972   After Visit Summary: (MyChart) Due to this being a telephonic visit, the after visit summary with patients personalized plan was offered to patient via MyChart   Notes: Nothing significant to report at this time.

## 2024-03-23 DIAGNOSIS — M5412 Radiculopathy, cervical region: Secondary | ICD-10-CM | POA: Diagnosis not present

## 2024-03-23 DIAGNOSIS — M4802 Spinal stenosis, cervical region: Secondary | ICD-10-CM | POA: Diagnosis not present

## 2024-03-27 DIAGNOSIS — E782 Mixed hyperlipidemia: Secondary | ICD-10-CM | POA: Diagnosis not present

## 2024-03-27 DIAGNOSIS — J449 Chronic obstructive pulmonary disease, unspecified: Secondary | ICD-10-CM | POA: Diagnosis not present

## 2024-03-27 DIAGNOSIS — C159 Malignant neoplasm of esophagus, unspecified: Secondary | ICD-10-CM | POA: Diagnosis not present

## 2024-03-27 DIAGNOSIS — R55 Syncope and collapse: Secondary | ICD-10-CM | POA: Diagnosis not present

## 2024-03-27 DIAGNOSIS — Z01818 Encounter for other preprocedural examination: Secondary | ICD-10-CM | POA: Diagnosis not present

## 2024-03-27 DIAGNOSIS — I10 Essential (primary) hypertension: Secondary | ICD-10-CM | POA: Diagnosis not present

## 2024-03-27 DIAGNOSIS — I2510 Atherosclerotic heart disease of native coronary artery without angina pectoris: Secondary | ICD-10-CM | POA: Diagnosis not present

## 2024-03-27 DIAGNOSIS — I739 Peripheral vascular disease, unspecified: Secondary | ICD-10-CM | POA: Diagnosis not present

## 2024-04-09 DIAGNOSIS — M50123 Cervical disc disorder at C6-C7 level with radiculopathy: Secondary | ICD-10-CM | POA: Diagnosis not present

## 2024-04-09 DIAGNOSIS — M4712 Other spondylosis with myelopathy, cervical region: Secondary | ICD-10-CM | POA: Diagnosis not present

## 2024-04-09 DIAGNOSIS — Z981 Arthrodesis status: Secondary | ICD-10-CM | POA: Diagnosis not present

## 2024-04-09 DIAGNOSIS — M5011 Cervical disc disorder with radiculopathy,  high cervical region: Secondary | ICD-10-CM | POA: Diagnosis not present

## 2024-04-11 ENCOUNTER — Telehealth: Payer: Self-pay | Admitting: *Deleted

## 2024-04-11 NOTE — Transitions of Care (Post Inpatient/ED Visit) (Signed)
   04/11/2024  Name: Gregory Eaton MRN: [id number] DOB: 10/05/1945  Today's TOC FU Call Status: Today's TOC FU Call Status:: Unsuccessful Call (1st Attempt) Unsuccessful Call (1st Attempt) Date: 04/11/24  Attempted to reach the patient regarding the most recent Inpatient/ED visit.  Follow Up Plan: Additional outreach attempts will be made to reach the patient to complete the Transitions of Care (Post Inpatient/ED visit) call.   Mliss Creed [facility], BSN RN Care Manager/ Transition of Care [place]/ [facility] [phone]

## 2024-04-12 ENCOUNTER — Telehealth: Payer: Self-pay | Admitting: *Deleted

## 2024-04-12 NOTE — Transitions of Care (Post Inpatient/ED Visit) (Signed)
   04/12/2024  Name: Gregory Eaton MRN: [id number] DOB: Mar 25, 1946  Today's TOC FU Call Status: Today's TOC FU Call Status:: Unsuccessful Call (2nd Attempt) Unsuccessful Call (2nd Attempt) Date: 04/12/24  Attempted to reach the patient regarding the most recent Inpatient/ED visit.  Follow Up Plan: Additional outreach attempts will be made to reach the patient to complete the Transitions of Care (Post Inpatient/ED visit) call.   Mliss Creed [facility], BSN RN Care Manager/ Transition of Care [place]/ [facility] [phone]

## 2024-04-12 NOTE — Transitions of Care (Post Inpatient/ED Visit) (Signed)
   04/12/2024  Name: ESTILL LLERENA MRN: [id number] DOB: [birth year]  Today's TOC FU Call Status: Today's TOC FU Call Status:: Unsuccessful Call (3rd Attempt) Unsuccessful Call (3rd Attempt) Date: 04/12/24  Attempted to reach the patient regarding the most recent Inpatient/ED visit.  Follow Up Plan: No further outreach attempts will be made at this time. We have been unable to contact the patient.  Mliss Creed [facility], BSN RN Care Manager/ Transition of Care [place]/ [facility] [phone]

## 2024-04-16 ENCOUNTER — Ambulatory Visit: Admitting: Family Medicine

## 2024-05-09 DIAGNOSIS — M4802 Spinal stenosis, cervical region: Secondary | ICD-10-CM | POA: Diagnosis not present

## 2024-05-11 ENCOUNTER — Other Ambulatory Visit (HOSPITAL_BASED_OUTPATIENT_CLINIC_OR_DEPARTMENT_OTHER): Payer: Self-pay | Admitting: Pulmonary Disease

## 2024-05-11 DIAGNOSIS — J449 Chronic obstructive pulmonary disease, unspecified: Secondary | ICD-10-CM

## 2024-05-11 NOTE — Telephone Encounter (Signed)
 Patient needs appointment for farther refills

## 2024-05-15 ENCOUNTER — Ambulatory Visit (INDEPENDENT_AMBULATORY_CARE_PROVIDER_SITE_OTHER): Admitting: Family Medicine

## 2024-05-15 ENCOUNTER — Encounter: Payer: Self-pay | Admitting: Family Medicine

## 2024-05-15 VITALS — BP 140/72 | HR 81 | Temp 98.0°F | Ht 68.0 in | Wt 149.5 lb

## 2024-05-15 DIAGNOSIS — K2100 Gastro-esophageal reflux disease with esophagitis, without bleeding: Secondary | ICD-10-CM

## 2024-05-15 DIAGNOSIS — I739 Peripheral vascular disease, unspecified: Secondary | ICD-10-CM

## 2024-05-15 DIAGNOSIS — Z0000 Encounter for general adult medical examination without abnormal findings: Secondary | ICD-10-CM

## 2024-05-15 DIAGNOSIS — J449 Chronic obstructive pulmonary disease, unspecified: Secondary | ICD-10-CM

## 2024-05-15 DIAGNOSIS — R911 Solitary pulmonary nodule: Secondary | ICD-10-CM

## 2024-05-15 DIAGNOSIS — I10 Essential (primary) hypertension: Secondary | ICD-10-CM

## 2024-05-15 DIAGNOSIS — M4802 Spinal stenosis, cervical region: Secondary | ICD-10-CM

## 2024-05-15 DIAGNOSIS — Z0001 Encounter for general adult medical examination with abnormal findings: Secondary | ICD-10-CM

## 2024-05-15 DIAGNOSIS — I2510 Atherosclerotic heart disease of native coronary artery without angina pectoris: Secondary | ICD-10-CM

## 2024-05-15 DIAGNOSIS — Z23 Encounter for immunization: Secondary | ICD-10-CM | POA: Diagnosis not present

## 2024-05-15 DIAGNOSIS — I6522 Occlusion and stenosis of left carotid artery: Secondary | ICD-10-CM | POA: Diagnosis not present

## 2024-05-15 DIAGNOSIS — E119 Type 2 diabetes mellitus without complications: Secondary | ICD-10-CM

## 2024-05-15 DIAGNOSIS — R7303 Prediabetes: Secondary | ICD-10-CM

## 2024-05-15 NOTE — Assessment & Plan Note (Signed)
 Followed by Ortho, s/p C3-7 laminectomy and fusion

## 2024-05-15 NOTE — Progress Notes (Signed)
   05/15/2024  Patient ID: Gregory Eaton, male   DOB: June 14, 1946, 78 y.o.   MRN: [id number]  Pharmacy Quality Measure Review  This patient is appearing on a report for being at risk of failing the adherence measure for cholesterol (statin) and hypertension (ACEi/ARB) medications this calendar year.   Medication: losartan  and rosuvastatin  Last fill date: 05/11/2024 for 90 day supply  Insurance report was not up to date. No action needed at this time.   Lang Sieve, PharmD, BCGP Clinical Pharmacist  [phone]

## 2024-05-15 NOTE — Assessment & Plan Note (Signed)
 followed by Pulmonology. Continue Anoro Ellipta  and Albuterol  PRN 2x monthly

## 2024-05-15 NOTE — Assessment & Plan Note (Signed)
 A1c 6.3%. uACR today. Foot exam UTD. Vaccines UTD. Retinal eye exam UTD. Recommend heart healthy diet such as Mediterranean diet with whole grains, fruits, vegetable, fish, lean meats, nuts, and olive oil. Limit salt. Encouraged moderate walking, 3-5 times/week for 30-50 minutes each session. Aim for at least 150 minutes.week. Goal should be pace of 3 miles/hours, or walking 1.5 miles in 30 minutes. Seek medical care for urinary frequency, extreme thirst, vision changes, lightheadedness, dizziness.

## 2024-05-15 NOTE — Assessment & Plan Note (Signed)
 Well controlled. Followed by cardiology. Continue Amlodipine  5mg  nightly, Losartan  100mg  daily, Aldactone  12.5mg  daily, and Metop Succinate 50mg  daily. Recommend heart healthy diet such as Mediterranean diet with whole grains, fruits, vegetable, fish, lean meats, nuts, and olive oil. Limit salt. Encouraged moderate walking, 3-5 times/week for 30-50 minutes each session. Aim for at least 150 minutes.week. Goal should be pace of 3 miles/hours, or walking 1.5 miles in 30 minutes. Avoid tobacco products. Avoid excess alcohol. Take medications as prescribed and bring medications and blood pressure log with cuff to each office visit. Seek medical care for chest pain, palpitations, shortness of breath with exertion, dizziness/lightheadedness, vision changes, recurrent headaches, or swelling of extremities. Follow up in 3 months or sooner if needed.

## 2024-05-15 NOTE — Assessment & Plan Note (Signed)
 followed by cardiology, s/p CABG in 1998, low-risk NST in 07/2013 and 01/2021); on Plavix  75mg  daily, Toprol -XL 50mg  daily, Crestor  10mg  daily

## 2024-05-15 NOTE — Progress Notes (Signed)
 Subjective:  HPI: Gregory Eaton is a 78 y.o. male presenting on 05/15/2024 for No chief complaint on file.   HPI Patient is in today for chronic condition management.  Mr Cuthrell has PMH of CAD, PAD, HLD, HTN, COPD, glaucoma, pulmonary nodule, esophageal carcinoma s/p esophagectomy, Barrett's esophagus, GERD, colonic adenomas, prediabetes, and cervical spinal stenosis s/p C3-7 laminectomy. He is also followed by GI, ophthalmology, vascular surgery, dermatology, cardiology, orthopedic surgery, and pulmonology.  CAD: followed by cardiology, s/p CABG in 1998, low-risk NST in 07/2013 and 01/2021); on Plavix  75mg  daily, Toprol -XL 50mg  daily, Crestor  10mg  daily  PAD: followed by vascular, s/p fem-pop bypass in 1993 and known occluded right SFA, right iliofemoral endarterectomy in 03/2021, left fem-pop bypass in 04/2021 and angioplasty of right external iliac artery in 08/2022; yearly ultrasounds, on plavix  and Crestor , not requiring Gabapentin   COPD GOLD II: followed by Pulmonology, on Anoro Ellipta  and Albuterol  PRN 2x monthly Tolerating activity at baseline  Glaucoma: followed by Ophthalmology, on Alphagan  and Xalatan   HTN: followed by Cardiology, on Amlodipine  5mg  daily, Losartan  100mg  daily, Toprol -XL 50mg  daily, Spironolactone  12.5mg  daily, accepting higher BP  HLD: on Crestor  10mg  daily Lipid Panel     Component Value Date/Time   CHOL 115 01/16/2024 0837   TRIG 104 01/16/2024 0837   HDL 47 01/16/2024 0837   CHOLHDL 2.4 01/16/2024 0837   VLDL 12 05/12/2021 0500   LDLCALC 49 01/16/2024 0837   GERD: followed by GI, history of esophageal carcinoma and Barrett's; on Lansoprazole  30mg  daily, Sucralfate  PRN  DM2: diet controlled Lab Results  Component Value Date   HGBA1C 6.3 (H) 01/16/2024   HGBA1C 6.5 (H) 07/19/2023   HGBA1C 6.5 (H) 04/09/2021    Spinal stenosis: s/p C3-7 laminectomy and fusion 04/09/2024  Insomnia: on Trazodone  50mg  nightly   Review of Systems   Constitutional: Negative.   HENT: Negative.    Eyes: Negative.   Respiratory: Negative.    Cardiovascular: Negative.   Gastrointestinal: Negative.   Genitourinary: Negative.   Musculoskeletal:  Positive for back pain.  Skin: Negative.   Neurological: Negative.   Endo/Heme/Allergies: Negative.   Psychiatric/Behavioral: Negative.    All other systems reviewed and are negative.   Relevant past medical history reviewed and updated as indicated.   Past Medical History:  Diagnosis Date   Adenomatous colon polyp 03/2009   Last colonoscopy by Dr. Shaaron    Adenomatous polyp 2010   Adenomatous polyp of colon 11/03/2010   Arthritis    Barrett's esophagus    CAD (coronary artery disease)    Cataract    Complication of anesthesia    has a shortened esophogus due to CA.    COPD (chronic obstructive pulmonary disease) (HCC)    Severe emphysema per CT   Diverticulosis    Emphysema of lung (HCC)    Esophageal carcinoma (HCC) 03/2009   T1N1M0   GERD (gastroesophageal reflux disease)    Glaucoma    Heart murmur    History of Doppler ultrasound 11/09/2011   03/2014- 50-69% L ICA stenosis;carotid doppler; L bulb/prox ICA 0-49% diameter reduction; L vertebral artery - occlusive ds; L ECA  demonstrates severe amount of fibrous plaque   History of Doppler ultrasound 11/09/2011   LEAs; R ABI - mod art. insuff.; L ABI normal at rest; R SFA - occlusive ds; L SFA - occlusive ds; patent fem-pop graft   History of echocardiogram 08/27/2009   EF >55%   History of hiatal hernia    History of  kidney stones 1965   History of nuclear stress test 11/24/2011   lexiscan ; normal perfusion; low risk scan; non-diagnostic for ischemia   Hyperlipidemia    Hypertension    Left carotid artery stenosis 04/08/2014   Pneumonia    Pre-diabetes    Pulmonary nodule, right 04/08/2014   2.8 mm-incidental finding on CT   PVD (peripheral vascular disease)    Tachyarrhythmia 1999   Status post ablation at [facility]    Tobacco abuse      Past Surgical History:  Procedure Laterality Date   ABDOMINAL AORTOGRAM W/LOWER EXTREMITY N/A 04/01/2021   Procedure: ABDOMINAL AORTOGRAM W/LOWER EXTREMITY;  Surgeon: Lanis Fonda BRAVO, MD;  Location: [facility] INVASIVE CV LAB;  Service: Cardiovascular;  Laterality: N/A;   ABDOMINAL AORTOGRAM W/LOWER EXTREMITY N/A 09/01/2022   Procedure: ABDOMINAL AORTOGRAM W/LOWER EXTREMITY;  Surgeon: Lanis Fonda BRAVO, MD;  Location: [facility] INVASIVE CV LAB;  Service: Cardiovascular;  Laterality: N/A;   BIOPSY  10/24/2017   Procedure: BIOPSY;  Surgeon: Shaaron Lamar HERO, MD;  Location: [facility];  Service: Endoscopy;;  esophagus   BIOPSY  10/22/2021   Procedure: BIOPSY;  Surgeon: Shaaron Lamar HERO, MD;  Location: [facility];  Service: Endoscopy;;   CATARACT EXTRACTION Bilateral    COLONOSCOPY  03/17/2009   Dr.Rourk- normal rectum, sigmoid diverticula, some pale sigmoid mucosa with diffuse petechiae. pedunculated polyp at the splenic flexure, remainder of colonic mucosa appeared normal. bx= adenomatous polyp   COLONOSCOPY  04/19/2003   Dr.Rehman- few diverticu;a at the sigmoid colon, 3 small polyps, one at the transverse colon and 2 at the sigmoid, small external hemorrhoids. bx report not available.    COLONOSCOPY WITH PROPOFOL  N/A 10/24/2017   Dr. Shaaron: Diverticulosis, 11 mm polyp at the ileocecal valve, tubular adenoma.  Repeat colonoscopy in 3 years   COLONOSCOPY WITH PROPOFOL  N/A 10/22/2021   Procedure: COLONOSCOPY WITH PROPOFOL ;  Surgeon: Shaaron Lamar HERO, MD;  Location: [facility];  Service: Endoscopy;  Laterality: N/A;  11:00am   CORONARY ARTERY BYPASS GRAFT  1998   [name]   ENDARTERECTOMY FEMORAL Right 04/13/2021   Procedure: RIGHT ILIOFEMORAL ARTERY ENDARTERECTOMY WITH PATCH ANGIOPLASTY USING HEMASHIELD PALTINUM FINESSE DACRON PATCH;  Surgeon: Lanis Fonda BRAVO, MD;  Location: [facility] OR;  Service: Vascular;  Laterality: Right;   ESOPHAGECTOMY  2010   [facility] [place]    ESOPHAGOGASTRODUODENOSCOPY  11/25/2010   Dr.Rourk- s/p esophagectomy with gastric pull-up, esophageal erosions straddling the surgical anastomosis, salmon colored epithelium coming up a good centimeter to a centimeter and a half above the suture line, islands of salmon colored epithelium in the most poximal residual esophagus, remainder of gastric mucosa appeared normal. bx= swamous &gastric glandular mucosa w/chronic active inflammation   ESOPHAGOGASTRODUODENOSCOPY  03/17/2009   Dr.Rourk- 4cm segment of salmon-colored epithlium distal esophagus suspicious for barretts esophagus. area of suspicious nodularity w/in this segment bx seperately. small to moderate size hiatal hernia, o/w normal stomach D1 and D2 bx=adenocarcinoma   ESOPHAGOGASTRODUODENOSCOPY (EGD) WITH PROPOFOL  N/A 10/24/2017   Dr. Shaaron: Remnant of esophagus with anastomosis with stomach at 24 cm from the incisors, 2 cm above the anastomosis he was found to have Barrett's but no dysplasia.  Advised for repeat EGD in 3 years   ESOPHAGOGASTRODUODENOSCOPY (EGD) WITH PROPOFOL  N/A 10/22/2021   Procedure: ESOPHAGOGASTRODUODENOSCOPY (EGD) WITH PROPOFOL ;  Surgeon: Shaaron Lamar HERO, MD;  Location: [facility];  Service: Endoscopy;  Laterality: N/A;   EYE SURGERY     cataract   FEMORAL-POPLITEAL BYPASS GRAFT  1993   occlusive ds in R SFA   FEMORAL-POPLITEAL BYPASS GRAFT Left 05/11/2021   Procedure: LEFT FEMORAL-BELOW KNEE POPLITEAL ARTERY BYPASS GRAFT WITH 6 mm PTFE and COMPLETION ANGIOGRAM;  Surgeon: Lanis Fonda BRAVO, MD;  Location: [facility] OR;  Service: Vascular;  Laterality: Left;   GASTRIC PULL THROUGH  2010   With esophagectomy   INSERTION OF ILIAC STENT Bilateral 04/13/2021   Procedure: INSERTION OF BILATERAL COMMON ILIAC KISSING STENTS AND INSERION OF RIGHT EXTERNAL ILIAC STENT;  Surgeon: Lanis Fonda BRAVO, MD;  Location: [facility] OR;  Service: Vascular;  Laterality: Bilateral;   JOINT REPLACEMENT     LIPOMA EXCISION  2010   LOWER EXTREMITY ANGIOGRAM  Left 04/13/2021   Procedure: LEFT LOWER EXTREMITY ANGIOGRAM ;  Surgeon: Lanis Fonda BRAVO, MD;  Location[facility] OR;  Service: Vascular;  Laterality: Left;   PERIPHERAL VASCULAR INTERVENTION  04/01/2021   Procedure: PERIPHERAL VASCULAR INTERVENTION;  Surgeon: Lanis Fonda BRAVO, MD;  Location: [facility] INVASIVE CV LAB;  Service: Cardiovascular;;   PERIPHERAL VASCULAR INTERVENTION Right 09/01/2022   Procedure: PERIPHERAL VASCULAR INTERVENTION;  Surgeon: Lanis Fonda BRAVO, MD;  Location: [facility] INVASIVE CV LAB;  Service: Cardiovascular;  Laterality: Right;  right external iliac   POLYPECTOMY  10/24/2017   Procedure: POLYPECTOMY;  Surgeon: Shaaron Lamar HERO, MD;  Location: [facility];  Service: Endoscopy;;  colon    POLYPECTOMY  10/22/2021   Procedure: POLYPECTOMY;  Surgeon: Shaaron Lamar HERO, MD;  Location: [facility];  Service: Endoscopy;;   TOTAL HIP ARTHROPLASTY Left 01/30/2019   Procedure: TOTAL HIP ARTHROPLASTY ANTERIOR APPROACH;  Surgeon: Beverley Evalene BIRCH, MD;  Location: [facility] ORS;  Service: Orthopedics;  Laterality: Left;   ULTRASOUND GUIDANCE FOR VASCULAR ACCESS Left 04/13/2021   Procedure: ULTRASOUND GUIDANCE FOR VASCULAR ACCESS, left femoral;  Surgeon: Lanis Fonda BRAVO, MD;  Location: [facility] OR;  Service: Vascular;  Laterality: Left;    Allergies and medications reviewed and updated.   Current Outpatient Medications:    acetaminophen  (TYLENOL ) 500 MG tablet, Take 1,000 mg by mouth daily as needed for headache., Disp: , Rfl:    albuterol  (PROVENTIL ) (2.5 MG/3ML) 0.083% nebulizer solution, USE ONE VIAL (2.5MG  TOTAL) IN NEBULIZER EVERY SIX HOURS AS NEEDED FOR WHEEZING OR SHORTNESS OF BREATH, Disp: 75 mL, Rfl: 6   albuterol  (VENTOLIN  HFA) 108 (90 Base) MCG/ACT inhaler, Inhale 2 puffs into the lungs every 4 (four) hours as needed for wheezing or shortness of breath., Disp: 1 each, Rfl: 0   amLODipine  (NORVASC ) 5 MG tablet, Take 1 tablet (5 mg total) by mouth daily., Disp: 180 tablet, Rfl: 3   baclofen  (LIORESAL ) 10  MG tablet, Take 1 tablet (10 mg total) by mouth 3 (three) times daily as needed for muscle spasms., Disp: 20 each, Rfl: 0   brimonidine  (ALPHAGAN ) 0.2 % ophthalmic solution, SMARTSIG:In Eye(s), Disp: , Rfl:    clopidogrel  (PLAVIX ) 75 MG tablet, Take 1 tablet (75 mg total) by mouth daily., Disp: 90 tablet, Rfl: 3   fluticasone  (FLONASE) 50 MCG/ACT nasal spray, Place 1 spray into both nostrils daily., Disp: , Rfl:    lansoprazole  (PREVACID ) 30 MG capsule, Take 1 capsule (30 mg total) by mouth daily before supper., Disp: 90 capsule, Rfl: 3   latanoprost  (XALATAN ) 0.005 % ophthalmic solution, SMARTSIG:In Eye(s), Disp: , Rfl:    losartan  (COZAAR ) 100 MG tablet, Take 1 tablet (100 mg total) by mouth at bedtime., Disp: 90 tablet, Rfl: 3   metoprolol  succinate (TOPROL -XL) 50 MG 24 hr tablet, Take 1 tablet (  50 mg total) by mouth daily. Take with or immediately following a meal., Disp: 90 tablet, Rfl: 3   montelukast  (SINGULAIR ) 10 MG tablet, TAKE ONE TABLET (10MG  TOTAL) BY MOUTH DAILY, Disp: 90 tablet, Rfl: 0   Multiple Vitamins-Minerals (CENTRUM SILVER PO), Take 1 tablet by mouth daily., Disp: , Rfl:    nitroGLYCERIN  (NITROSTAT ) 0.4 MG SL tablet, Place 1 tablet (0.4 mg total) under the tongue every 5 (five) minutes as needed for chest pain., Disp: 25 tablet, Rfl: 0   ondansetron  (ZOFRAN ) 4 MG tablet, Take 4 mg by mouth., Disp: , Rfl:    rosuvastatin  (CRESTOR ) 10 MG tablet, Take 1 tablet (10 mg total) by mouth daily., Disp: 90 tablet, Rfl: 3   spironolactone  (ALDACTONE ) 25 MG tablet, TAKE 0.5 TABLETS (12.5 MG TOTATL) BY MOUTH DAILY, Disp: 45 tablet, Rfl: 3   sucralfate  (CARAFATE ) 1 g tablet, Take 1 tablet (1 g total) by mouth 4 (four) times daily -  with meals and at bedtime. As needed for esophageal pain, heartburn. You can crush in couple of tablespoons of water  to make suspension., Disp: 120 tablet, Rfl: 5   traZODone  (DESYREL ) 50 MG tablet, Take 0.5-1 tablets (25-50 mg total) by mouth at bedtime as needed  for sleep., Disp: 90 tablet, Rfl: 1   umeclidinium-vilanterol (ANORO ELLIPTA ) 62.5-25 MCG/ACT AEPB, Inhale 1 puff into the lungs daily., Disp: 60 each, Rfl: 11   gabapentin  (NEURONTIN ) 100 MG capsule, Take 1 capsule (100 mg total) by mouth 2 (two) times daily AND 2 capsules (200 mg total) at bedtime. (Patient not taking: No sig reported), Disp: 360 capsule, Rfl: 1   penicillin v potassium (VEETID) 500 MG tablet, Take 500 mg by mouth 3 (three) times daily. (Patient not taking: Reported on 05/15/2024), Disp: , Rfl:   Allergies  Allergen Reactions   Altace [Ramipril] Cough    Objective:   BP (!) 140/72   Pulse 81   Temp 98 F (36.7 C)   Ht 5' 8 (1.727 m)   Wt 149 lb 8 oz (67.8 kg)   SpO2 98%   BMI 22.73 kg/m      05/15/2024    7:54 AM 03/21/2024    8:52 AM 01/16/2024    7:58 AM  Vitals with BMI  Height 5' 8 5' 8 5' 8  Weight 149 lbs 8 oz 156 lbs 156 lbs  BMI 22.74 23.73 23.73  Systolic 140 -- 130  Diastolic 72 -- 70  Pulse 81  76     Physical Exam Vitals and nursing note reviewed.  Constitutional:      Appearance: Normal appearance. He is normal weight.  HENT:     Head: Normocephalic and atraumatic.     Right Ear: Tympanic membrane, ear canal and external ear normal.     Left Ear: Tympanic membrane, ear canal and external ear normal.     Nose: Nose normal.     Mouth/Throat:     Mouth: Mucous membranes are moist.     Pharynx: Oropharynx is clear.  Eyes:     Extraocular Movements: Extraocular movements intact.     Right eye: Normal extraocular motion and no nystagmus.     Left eye: Normal extraocular motion and no nystagmus.     Conjunctiva/sclera:     Right eye: Right conjunctiva is injected.     Left eye: Left conjunctiva is injected.     Pupils: Pupils are equal, round, and reactive to light.  Cardiovascular:     Rate and Rhythm:  Normal rate and regular rhythm.     Pulses: Normal pulses.     Heart sounds: Normal heart sounds.  Pulmonary:     Effort:  Pulmonary effort is normal.     Breath sounds: Normal breath sounds.  Abdominal:     General: Bowel sounds are normal.     Palpations: Abdomen is soft.  Genitourinary:    Comments: Deferred using shared decision making Musculoskeletal:        General: Normal range of motion.     Cervical back: Normal range of motion and neck supple.  Skin:    General: Skin is warm and dry.     Capillary Refill: Capillary refill takes less than 2 seconds.  Neurological:     General: No focal deficit present.     Mental Status: He is alert. Mental status is at baseline.  Psychiatric:        Mood and Affect: Mood normal.        Speech: Speech normal.        Behavior: Behavior normal.        Thought Content: Thought content normal.        Cognition and Memory: Cognition and memory normal.        Judgment: Judgment normal.     Assessment & Plan:  Immunization due -     Flu vaccine HIGH DOSE PF(Fluzone Trivalent)  Physical exam, annual Assessment & Plan: Today your medical history was reviewed and routine physical exam with labs was performed. Recommend 150 minutes of moderate intensity exercise weekly and consuming a well-balanced diet. Advised to stop smoking if a smoker, avoid smoking if a non-smoker, limit alcohol consumption to 1 drink per day for women and 2 drinks per day for men, and avoid illicit drug use. Vaccine maintenance discussed. Appropriate health maintenance items reviewed. Return to office in 1 year for annual physical exam.   Orders: -     CBC with Differential/Platelet -     Comprehensive metabolic panel with GFR -     Lipid panel -     Hemoglobin A1c -     Microalbumin / creatinine urine ratio  Essential hypertension Assessment & Plan: Well controlled. Followed by cardiology. Continue Amlodipine  5mg  nightly, Losartan  100mg  daily, Aldactone  12.5mg  daily, and Metop Succinate 50mg  daily. Recommend heart healthy diet such as Mediterranean diet with whole grains, fruits,  vegetable, fish, lean meats, nuts, and olive oil. Limit salt. Encouraged moderate walking, 3-5 times/week for 30-50 minutes each session. Aim for at least 150 minutes.week. Goal should be pace of 3 miles/hours, or walking 1.5 miles in 30 minutes. Avoid tobacco products. Avoid excess alcohol. Take medications as prescribed and bring medications and blood pressure log with cuff to each office visit. Seek medical care for chest pain, palpitations, shortness of breath with exertion, dizziness/lightheadedness, vision changes, recurrent headaches, or swelling of extremities. Follow up in 3 months or sooner if needed.  Orders: -     CBC with Differential/Platelet -     Comprehensive metabolic panel with GFR -     Lipid panel  Left carotid artery stenosis  PAD (peripheral artery disease) Assessment & Plan: Followed by vascular, s/p fem-pop bypass in 1993 and known occluded right SFA, right iliofemoral endarterectomy in 03/2021, left fem-pop bypass in 04/2021 and angioplasty of right external iliac artery in 08/2022; yearly ultrasounds  Continue plavix  and Crestor , DC Gabapentin    COPD GOLD 2 , group B, by GOLD 2017 classification (HCC) Assessment & Plan: followed by  Pulmonology. Continue Anoro Ellipta  and Albuterol  PRN 2x monthly   Pulmonary nodule, right  Gastroesophageal reflux disease with esophagitis without hemorrhage Assessment & Plan: Followed by GI. Continue Lansoprazole  30mg  daily and PRN Carafate   History of esophageal carcinoma and Barrett's Elevated HOB if needed and avoid lying down 2-3 hours after eating, avoid coffee, alcohol, chocolate, fatty foods, citrus, carbonated beverages, spicy foods, late meals, and smoking. Return to office if symptoms return or worsen and seek medical care for difficulty swallowing, bleeding, anemia, weight loss, or recurrent vomiting.    Orders: -     CBC with Differential/Platelet -     Comprehensive metabolic panel with GFR -     Lipid  panel  Prediabetes -     Hemoglobin A1c -     Microalbumin / creatinine urine ratio  Need for vaccination -     Varicella-zoster vaccine IM  Coronary artery disease involving native coronary artery of native heart without angina pectoris Assessment & Plan: followed by cardiology, s/p CABG in 1998, low-risk NST in 07/2013 and 01/2021); on Plavix  75mg  daily, Toprol -XL 50mg  daily, Crestor  10mg  daily   Type 2 diabetes mellitus without complication, without long-term current use of insulin  (HCC) Assessment & Plan: A1c 6.3%. uACR today. Foot exam UTD. Vaccines UTD. Retinal eye exam UTD. Recommend heart healthy diet such as Mediterranean diet with whole grains, fruits, vegetable, fish, lean meats, nuts, and olive oil. Limit salt. Encouraged moderate walking, 3-5 times/week for 30-50 minutes each session. Aim for at least 150 minutes.week. Goal should be pace of 3 miles/hours, or walking 1.5 miles in 30 minutes. Seek medical care for urinary frequency, extreme thirst, vision changes, lightheadedness, dizziness.     Cervical spinal stenosis Assessment & Plan: Followed by Ortho, s/p C3-7 laminectomy and fusion      Follow up plan: Return in about 6 months (around 11/13/2024) for chronic follow-up with labs 1 week prior.  Jeoffrey GORMAN Barrio, FNP

## 2024-05-15 NOTE — Assessment & Plan Note (Signed)
 Followed by GI. Continue Lansoprazole  30mg  daily and PRN Carafate   History of esophageal carcinoma and Barrett's Elevated HOB if needed and avoid lying down 2-3 hours after eating, avoid coffee, alcohol, chocolate, fatty foods, citrus, carbonated beverages, spicy foods, late meals, and smoking. Return to office if symptoms return or worsen and seek medical care for difficulty swallowing, bleeding, anemia, weight loss, or recurrent vomiting.

## 2024-05-15 NOTE — Assessment & Plan Note (Signed)
 Followed by vascular, s/p fem-pop bypass in 1993 and known occluded right SFA, right iliofemoral endarterectomy in 03/2021, left fem-pop bypass in 04/2021 and angioplasty of right external iliac artery in 08/2022; yearly ultrasounds  Continue plavix  and Crestor , DC Gabapentin 

## 2024-05-15 NOTE — Assessment & Plan Note (Signed)
Today your medical history was reviewed and routine physical exam with labs was performed. Recommend 150 minutes of moderate intensity exercise weekly and consuming a well-balanced diet. Advised to stop smoking if a smoker, avoid smoking if a non-smoker, limit alcohol consumption to 1 drink per day for women and 2 drinks per day for men, and avoid illicit drug use. Vaccine maintenance discussed. Appropriate health maintenance items reviewed. Return to office in 1 year for annual physical exam.

## 2024-05-16 ENCOUNTER — Ambulatory Visit: Payer: Self-pay | Admitting: Family Medicine

## 2024-05-16 DIAGNOSIS — K5900 Constipation, unspecified: Secondary | ICD-10-CM

## 2024-05-16 DIAGNOSIS — I10 Essential (primary) hypertension: Secondary | ICD-10-CM

## 2024-05-16 DIAGNOSIS — K2100 Gastro-esophageal reflux disease with esophagitis, without bleeding: Secondary | ICD-10-CM

## 2024-05-17 ENCOUNTER — Other Ambulatory Visit: Payer: Self-pay

## 2024-05-17 DIAGNOSIS — Z860101 Personal history of adenomatous and serrated colon polyps: Secondary | ICD-10-CM

## 2024-05-17 LAB — LIPID PANEL
Cholesterol: 113 mg/dL (ref <200)
HDL: 44 mg/dL (ref >=40)
LDL Cholesterol (Calc): 46 mg/dL
Non-HDL Cholesterol (Calc): 69 mg/dL (ref <130)
Total CHOL/HDL Ratio: 2.6 (calc) (ref <5.0)
Triglycerides: 155 mg/dL — ABNORMAL HIGH (ref <150)

## 2024-05-17 LAB — COMPREHENSIVE METABOLIC PANEL WITH GFR
AG Ratio: 1.9 (calc) (ref 1.0–2.5)
ALT: 12 U/L (ref 9–46)
AST: 13 U/L (ref 10–35)
Albumin: 4.4 g/dL (ref 3.6–5.1)
Alkaline phosphatase (APISO): 72 U/L (ref 35–144)
BUN: 16 mg/dL (ref 7–25)
CO2: 25 mmol/L (ref 20–32)
Calcium: 9.7 mg/dL (ref 8.6–10.3)
Chloride: 107 mmol/L (ref 98–110)
Creat: 0.86 mg/dL (ref 0.70–1.28)
Globulin: 2.3 g/dL (ref 1.9–3.7)
Glucose, Bld: 109 mg/dL — ABNORMAL HIGH (ref 65–99)
Potassium: 5 mmol/L (ref 3.5–5.3)
Sodium: 141 mmol/L (ref 135–146)
Total Bilirubin: 0.3 mg/dL (ref 0.2–1.2)
Total Protein: 6.7 g/dL (ref 6.1–8.1)
eGFR: 88 mL/min/1.73m2 (ref >=60)

## 2024-05-17 LAB — CBC WITH DIFFERENTIAL/PLATELET
Absolute Lymphocytes: 1841 {cells}/uL (ref 850–3900)
Absolute Monocytes: 770 {cells}/uL (ref 200–950)
Basophils Absolute: 63 {cells}/uL (ref 0–200)
Basophils Relative: 0.9 %
Eosinophils Absolute: 168 {cells}/uL (ref 15–500)
Eosinophils Relative: 2.4 %
HCT: 39.8 % (ref 38.5–50.0)
Hemoglobin: 12.6 g/dL — ABNORMAL LOW (ref 13.2–17.1)
MCH: 29.6 pg (ref 27.0–33.0)
MCHC: 31.7 g/dL — ABNORMAL LOW (ref 32.0–36.0)
MCV: 93.6 fL (ref 80.0–100.0)
MPV: 8.5 fL (ref 7.5–12.5)
Monocytes Relative: 11 %
Neutro Abs: 4158 {cells}/uL (ref 1500–7800)
Neutrophils Relative %: 59.4 %
Platelets: 393 Thousand/uL (ref 140–400)
RBC: 4.25 Million/uL (ref 4.20–5.80)
RDW: 12.4 % (ref 11.0–15.0)
Total Lymphocyte: 26.3 %
WBC: 7 Thousand/uL (ref 3.8–10.8)

## 2024-05-17 LAB — VITAMIN B12: Vitamin B-12: 1110 pg/mL — ABNORMAL HIGH (ref 200–1100)

## 2024-05-17 LAB — TEST AUTHORIZATION

## 2024-05-17 LAB — IRON,TIBC AND FERRITIN PANEL
%SAT: 10 % — ABNORMAL LOW (ref 20–48)
Ferritin: 14 ng/mL — ABNORMAL LOW (ref 24–380)
Iron: 41 ug/dL — ABNORMAL LOW (ref 50–180)
TIBC: 409 ug/dL (ref 250–425)

## 2024-05-17 LAB — HEMOGLOBIN A1C
Hgb A1c MFr Bld: 6.1 % — ABNORMAL HIGH (ref <5.7)
Mean Plasma Glucose: 128 mg/dL
eAG (mmol/L): 7.1 mmol/L

## 2024-05-17 LAB — MICROALBUMIN / CREATININE URINE RATIO
Creatinine, Urine: 68 mg/dL (ref 20–320)
Microalb Creat Ratio: 4 mg/g{creat} (ref <30)
Microalb, Ur: 0.3 mg/dL

## 2024-05-21 ENCOUNTER — Telehealth: Payer: Self-pay | Admitting: Family Medicine

## 2024-05-21 NOTE — Telephone Encounter (Signed)
 Copied from CRM [id number]. Topic: General - Call Back - No Documentation >> May 17, 2024  3:03 PM Kevelyn M wrote: Reason for CRM: Patient calling back for [facility]. Attempted to transfer to [facility] but she's with a  patient. Please call patient back. Call back # [phone]

## 2024-05-22 ENCOUNTER — Other Ambulatory Visit (HOSPITAL_COMMUNITY)
Admission: RE | Admit: 2024-05-22 | Discharge: 2024-05-22 | Disposition: A | Discharge status: Home / Self Care | Source: Ambulatory Visit | Attending: Family Medicine | Admitting: Family Medicine

## 2024-05-22 ENCOUNTER — Other Ambulatory Visit

## 2024-05-23 ENCOUNTER — Other Ambulatory Visit (HOSPITAL_COMMUNITY)
Admission: RE | Admit: 2024-05-23 | Discharge: 2024-05-23 | Disposition: A | Discharge status: Home / Self Care | Source: Ambulatory Visit | Attending: Family Medicine | Admitting: Family Medicine

## 2024-05-23 DIAGNOSIS — Z860101 Personal history of adenomatous and serrated colon polyps: Secondary | ICD-10-CM | POA: Diagnosis present

## 2024-05-23 DIAGNOSIS — Z09 Encounter for follow-up examination after completed treatment for conditions other than malignant neoplasm: Secondary | ICD-10-CM | POA: Insufficient documentation

## 2024-05-23 LAB — OCCULT BLOOD X 1 CARD TO LAB, STOOL: Fecal Occult Bld: NEGATIVE

## 2024-05-29 NOTE — Progress Notes (Unsigned)
 Office Note     CC:  follow up Requesting Provider:  Kayla Jeoffrey RAMAN, FNP  HPI: Gregory Eaton is a 78 y.o. (09-17-1945) male who presents for surveillance of PAD.    Surgical history:  04/03/2021 left external iliac artery stenting 04/11/2021 - right iliofemoral endarterectomy with bilateral common iliac stents and right external iliac stent  05/11/2021 - left femoral to below the knee popliteal bypass with ringed PTFE 09/01/2022 - drug-coated balloon angioplasty and extension of right iliac stents  On exam, Gregory Eaton was doing well.  He continues to live an active lifestyle, living independently with his wife.  He does not plan on slowing up anytime soon.  He is mowing his grass this afternoon, and plans on doing this for as long as possible.  He denies any claudication, rest pain, or tissue loss.  He is on ASA / Plavix  and statin daily.  He denies tobacco use. He is also followed for carotid artery stenosis.  He does not have any strokelike symptoms including slurring speech, changes in vision, or one-sided weakness.   Has baseline neuropathy in the feet which has been present for a number of years.  Past Medical History:  Diagnosis Date   Adenomatous colon polyp 03/2009   Last colonoscopy by Dr. Shaaron    Adenomatous polyp 2010   Adenomatous polyp of colon 11/03/2010   Arthritis    Barrett's esophagus    CAD (coronary artery disease)    Cataract    Complication of anesthesia    has a shortened esophogus due to CA.    COPD (chronic obstructive pulmonary disease) (HCC)    Severe emphysema per CT   Diverticulosis    Emphysema of lung (HCC)    Esophageal carcinoma (HCC) 03/2009   T1N1M0   GERD (gastroesophageal reflux disease)    Glaucoma    Heart murmur    History of Doppler ultrasound 11/09/2011   03/2014- 50-69% L ICA stenosis;carotid doppler; L bulb/prox ICA 0-49% diameter reduction; L vertebral artery - occlusive ds; L ECA  demonstrates severe amount of fibrous plaque    History of Doppler ultrasound 11/09/2011   LEAs; R ABI - mod art. insuff.; L ABI normal at rest; R SFA - occlusive ds; L SFA - occlusive ds; patent fem-pop graft   History of echocardiogram 08/27/2009   EF >55%   History of hiatal hernia    History of kidney stones 1965   History of nuclear stress test 11/24/2011   lexiscan ; normal perfusion; low risk scan; non-diagnostic for ischemia   Hyperlipidemia    Hypertension    Left carotid artery stenosis 04/08/2014   Pneumonia    Pre-diabetes    Pulmonary nodule, right 04/08/2014   2.8 mm-incidental finding on CT   PVD (peripheral vascular disease)    Tachyarrhythmia 1999   Status post ablation at [facility]   Tobacco abuse     Past Surgical History:  Procedure Laterality Date   ABDOMINAL AORTOGRAM W/LOWER EXTREMITY N/A 04/01/2021   Procedure: ABDOMINAL AORTOGRAM W/LOWER EXTREMITY;  Surgeon: Lanis Fonda BRAVO, MD;  Location: [facility] INVASIVE CV LAB;  Service: Cardiovascular;  Laterality: N/A;   ABDOMINAL AORTOGRAM W/LOWER EXTREMITY N/A 09/01/2022   Procedure: ABDOMINAL AORTOGRAM W/LOWER EXTREMITY;  Surgeon: Lanis Fonda BRAVO, MD;  Location: [facility] INVASIVE CV LAB;  Service: Cardiovascular;  Laterality: N/A;   BIOPSY  10/24/2017   Procedure: BIOPSY;  Surgeon: Shaaron Lamar HERO, MD;  Location: [facility];  Service: Endoscopy;;  esophagus   BIOPSY  10/22/2021   Procedure: BIOPSY;  Surgeon: Shaaron Lamar HERO, MD;  Location: [facility];  Service: Endoscopy;;   CATARACT EXTRACTION Bilateral    COLONOSCOPY  03/17/2009   Dr.Rourk- normal rectum, sigmoid diverticula, some pale sigmoid mucosa with diffuse petechiae. pedunculated polyp at the splenic flexure, remainder of colonic mucosa appeared normal. bx= adenomatous polyp   COLONOSCOPY  04/19/2003   Dr.Rehman- few diverticu;a at the sigmoid colon, 3 small polyps, one at the transverse colon and 2 at the sigmoid, small external hemorrhoids. bx report not available.    COLONOSCOPY WITH PROPOFOL  N/A 10/24/2017    Dr. Shaaron: Diverticulosis, 11 mm polyp at the ileocecal valve, tubular adenoma.  Repeat colonoscopy in 3 years   COLONOSCOPY WITH PROPOFOL  N/A 10/22/2021   Procedure: COLONOSCOPY WITH PROPOFOL ;  Surgeon: Shaaron Lamar HERO, MD;  Location: [facility];  Service: Endoscopy;  Laterality: N/A;  11:00am   CORONARY ARTERY BYPASS GRAFT  1998   [name]   ENDARTERECTOMY FEMORAL Right 04/13/2021   Procedure: RIGHT ILIOFEMORAL ARTERY ENDARTERECTOMY WITH PATCH ANGIOPLASTY USING HEMASHIELD PALTINUM FINESSE DACRON PATCH;  Surgeon: Lanis Fonda BRAVO, MD;  Location: [facility] OR;  Service: Vascular;  Laterality: Right;   ESOPHAGECTOMY  2010   [facility]   ESOPHAGOGASTRODUODENOSCOPY  11/25/2010   Dr.Rourk- s/p esophagectomy with gastric pull-up, esophageal erosions straddling the surgical anastomosis, salmon colored epithelium coming up a good centimeter to a centimeter and a half above the suture line, islands of salmon colored epithelium in the most poximal residual esophagus, remainder of gastric mucosa appeared normal. bx= swamous &gastric glandular mucosa w/chronic active inflammation   ESOPHAGOGASTRODUODENOSCOPY  03/17/2009   Dr.Rourk- 4cm segment of salmon-colored epithlium distal esophagus suspicious for barretts esophagus. area of suspicious nodularity w/in this segment bx seperately. small to moderate size hiatal hernia, o/w normal stomach D1 and D2 bx=adenocarcinoma   ESOPHAGOGASTRODUODENOSCOPY (EGD) WITH PROPOFOL  N/A 10/24/2017   Dr. Shaaron: Remnant of esophagus with anastomosis with stomach at 24 cm from the incisors, 2 cm above the anastomosis he was found to have Barrett's but no dysplasia.  Advised for repeat EGD in 3 years   ESOPHAGOGASTRODUODENOSCOPY (EGD) WITH PROPOFOL  N/A 10/22/2021   Procedure: ESOPHAGOGASTRODUODENOSCOPY (EGD) WITH PROPOFOL ;  Surgeon: Shaaron Lamar HERO, MD;  Location: [facility];  Service: Endoscopy;  Laterality: N/A;   EYE SURGERY     cataract   FEMORAL-POPLITEAL BYPASS  GRAFT  1993   occlusive ds in R SFA   FEMORAL-POPLITEAL BYPASS GRAFT Left 05/11/2021   Procedure: LEFT FEMORAL-BELOW KNEE POPLITEAL ARTERY BYPASS GRAFT WITH 6 mm PTFE and COMPLETION ANGIOGRAM;  Surgeon: Lanis Fonda BRAVO, MD;  Location: [facility] OR;  Service: Vascular;  Laterality: Left;   GASTRIC PULL THROUGH  2010   With esophagectomy   INSERTION OF ILIAC STENT Bilateral 04/13/2021   Procedure: INSERTION OF BILATERAL COMMON ILIAC KISSING STENTS AND INSERION OF RIGHT EXTERNAL ILIAC STENT;  Surgeon: Lanis Fonda BRAVO, MD;  Location: [facility] OR;  Service: Vascular;  Laterality: Bilateral;   JOINT REPLACEMENT     LIPOMA EXCISION  2010   LOWER EXTREMITY ANGIOGRAM Left 04/13/2021   Procedure: LEFT LOWER EXTREMITY ANGIOGRAM ;  Surgeon: Lanis Fonda BRAVO, MD;  Location: [facility] OR;  Service: Vascular;  Laterality: Left;   PERIPHERAL VASCULAR INTERVENTION  04/01/2021   Procedure: PERIPHERAL VASCULAR INTERVENTION;  Surgeon: Lanis Fonda BRAVO, MD;  Location: [facility] INVASIVE CV LAB;  Service: Cardiovascular;;   PERIPHERAL VASCULAR INTERVENTION Right 09/01/2022   Procedure: PERIPHERAL VASCULAR INTERVENTION;  Surgeon: Lanis Fonda  E, MD;  Location: [facility] INVASIVE CV LAB;  Service: Cardiovascular;  Laterality: Right;  right external iliac   POLYPECTOMY  10/24/2017   Procedure: POLYPECTOMY;  Surgeon: Shaaron Lamar HERO, MD;  [facility]: [facility];  Service: Endoscopy;;  colon    POLYPECTOMY  10/22/2021   Procedure: POLYPECTOMY;  Surgeon: Shaaron Lamar HERO, MD;  Location: [facility];  Service: Endoscopy;;   TOTAL HIP ARTHROPLASTY Left 01/30/2019   Procedure: TOTAL HIP ARTHROPLASTY ANTERIOR APPROACH;  Surgeon: Beverley Evalene BIRCH, MD;  Location: [facility] ORS;  Service: Orthopedics;  Laterality: Left;   ULTRASOUND GUIDANCE FOR VASCULAR ACCESS Left 04/13/2021   Procedure: ULTRASOUND GUIDANCE FOR VASCULAR ACCESS, left femoral;  Surgeon: Lanis Fonda BRAVO, MD;  Location: [facility] OR;  Service: Vascular;  Laterality: Left;    Social History    Socioeconomic History   Marital status: Married    Spouse name: Not on file   Number of children: 2   Years of education: Not on file   Highest education level: 8th grade  Occupational History   Occupation: retired    Associate Professor: RETIRED    Comment: truck driver  Tobacco Use   Smoking status: Former    Current packs/day: 0.00    Average packs/day: 2.0 packs/day for 40.0 years (80.0 ttl pk-yrs)    Types: Cigarettes    Start date: 89    Quit date: 07/20/1995    Years since quitting: 28.8    Passive exposure: Never   Smokeless tobacco: Never  Vaping Use   Vaping status: Never Used  Substance and Sexual Activity   Alcohol use: No    Alcohol/week: 0.0 standard drinks of alcohol   Drug use: No   Sexual activity: Yes    Partners: Female    Birth control/protection: Condom    Comment: friend  Other Topics Concern   Not on file  Social History Narrative   Not on file   Social Drivers of Health   Financial Resource Strain: Patient Declined (05/10/2024)   Overall Financial Resource Strain (CARDIA)    Difficulty of Paying Living Expenses: Patient declined  Food Insecurity: Patient Declined (05/10/2024)   Hunger Vital Sign    Worried About Running Out of Food in the Last Year: Patient declined    Ran Out of Food in the Last Year: Patient declined  Transportation Needs: Patient Declined (05/10/2024)   PRAPARE - Administrator, Civil Service (Medical): Patient declined    Lack of Transportation (Non-Medical): Patient declined  Physical Activity: Unknown (05/10/2024)   Exercise Vital Sign    Days of Exercise per Week: Patient declined    Minutes of Exercise per Session: Not on file  Recent Concern: Physical Activity - Insufficiently Active (03/21/2024)   Exercise Vital Sign    Days of Exercise per Week: 3 days    Minutes of Exercise per Session: 30 min  Stress: Patient Declined (05/10/2024)   [employer] of Occupational Health - Occupational Stress  Questionnaire    Feeling of Stress: Patient declined  Social Connections: Unknown (05/10/2024)   Social Connection and Isolation Panel    Frequency of Communication with Friends and Family: Patient declined    Frequency of Social Gatherings with Friends and Family: Patient declined    Attends Religious Services: Patient declined    Active Member of Clubs or Organizations: Patient declined    Attends Banker Meetings: Not on file    Marital Status: Patient declined  Intimate Partner Violence: Not At Risk (04/09/2024)  Received from [facility]   HITS    Over the last 12 months how often did your partner physically hurt you?: Never    Over the last 12 months how often did your partner insult you or talk down to you?: Never    Over the last 12 months how often did your partner threaten you with physical harm?: Never    Over the last 12 months how often did your partner scream or curse at you?: Never    Family History  Problem Relation Age of Onset   Stroke Mother    GER disease Mother    Congenital heart disease Sister    Coronary artery disease Brother    Colon cancer Neg Hx     Current Outpatient Medications  Medication Sig Dispense Refill   acetaminophen  (TYLENOL ) 500 MG tablet Take 1,000 mg by mouth daily as needed for headache.     albuterol  (PROVENTIL ) (2.5 MG/3ML) 0.083% nebulizer solution USE ONE VIAL (2.5MG  TOTAL) IN NEBULIZER EVERY SIX HOURS AS NEEDED FOR WHEEZING OR SHORTNESS OF BREATH 75 mL 6   albuterol  (VENTOLIN  HFA) 108 (90 Base) MCG/ACT inhaler Inhale 2 puffs into the lungs every 4 (four) hours as needed for wheezing or shortness of breath. 1 each 0   amLODipine  (NORVASC ) 5 MG tablet Take 1 tablet (5 mg total) by mouth daily. 180 tablet 3   baclofen  (LIORESAL ) 10 MG tablet Take 1 tablet (10 mg total) by mouth 3 (three) times daily as needed for muscle spasms. 20 each 0   brimonidine  (ALPHAGAN ) 0.2 % ophthalmic solution SMARTSIG:In Eye(s)     clopidogrel   (PLAVIX ) 75 MG tablet Take 1 tablet (75 mg total) by mouth daily. 90 tablet 3   fluticasone  (FLONASE) 50 MCG/ACT nasal spray Place 1 spray into both nostrils daily.     gabapentin  (NEURONTIN ) 100 MG capsule Take 1 capsule (100 mg total) by mouth 2 (two) times daily AND 2 capsules (200 mg total) at bedtime. (Patient not taking: No sig reported) 360 capsule 1   lansoprazole  (PREVACID ) 30 MG capsule Take 1 capsule (30 mg total) by mouth daily before supper. 90 capsule 3   latanoprost  (XALATAN ) 0.005 % ophthalmic solution SMARTSIG:In Eye(s)     losartan  (COZAAR ) 100 MG tablet Take 1 tablet (100 mg total) by mouth at bedtime. 90 tablet 3   metoprolol  succinate (TOPROL -XL) 50 MG 24 hr tablet Take 1 tablet (50 mg total) by mouth daily. Take with or immediately following a meal. 90 tablet 3   montelukast  (SINGULAIR ) 10 MG tablet TAKE ONE TABLET (10MG  TOTAL) BY MOUTH DAILY 90 tablet 0   Multiple Vitamins-Minerals (CENTRUM SILVER PO) Take 1 tablet by mouth daily.     nitroGLYCERIN  (NITROSTAT ) 0.4 MG SL tablet Place 1 tablet (0.4 mg total) under the tongue every 5 (five) minutes as needed for chest pain. 25 tablet 0   ondansetron  (ZOFRAN ) 4 MG tablet Take 4 mg by mouth.     penicillin v potassium (VEETID) 500 MG tablet Take 500 mg by mouth 3 (three) times daily. (Patient not taking: Reported on 05/15/2024)     rosuvastatin  (CRESTOR ) 10 MG tablet Take 1 tablet (10 mg total) by mouth daily. 90 tablet 3   spironolactone  (ALDACTONE ) 25 MG tablet TAKE 0.5 TABLETS (12.5 MG TOTATL) BY MOUTH DAILY 45 tablet 3   sucralfate  (CARAFATE ) 1 g tablet Take 1 tablet (1 g total) by mouth 4 (four) times daily -  with meals and at bedtime. As needed for esophageal  pain, heartburn. You can crush in couple of tablespoons of water  to make suspension. 120 tablet 5   traZODone  (DESYREL ) 50 MG tablet Take 0.5-1 tablets (25-50 mg total) by mouth at bedtime as needed for sleep. 90 tablet 1   umeclidinium-vilanterol (ANORO ELLIPTA ) 62.5-25  MCG/ACT AEPB Inhale 1 puff into the lungs daily. 60 each 11   No current facility-administered medications for this visit.    Allergies  Allergen Reactions   Altace [Ramipril] Cough     REVIEW OF SYSTEMS:   [X]  denotes positive finding, [ ]  denotes negative finding Cardiac  Comments:  Chest pain or chest pressure:    Shortness of breath upon exertion:    Short of breath when lying flat:    Irregular heart rhythm:        Vascular    Pain in calf, thigh, or hip brought on by ambulation:    Pain in feet at night that wakes you up from your sleep:     Blood clot in your veins:    Leg swelling:         Pulmonary    Oxygen  at home:    Productive cough:     Wheezing:         Neurologic    Sudden weakness in arms or legs:     Sudden numbness in arms or legs:     Sudden onset of difficulty speaking or slurred speech:    Temporary loss of vision in one eye:     Problems with dizziness:         Gastrointestinal    Blood in stool:     Vomited blood:         Genitourinary    Burning when urinating:     Blood in urine:        Psychiatric    Major depression:         Hematologic    Bleeding problems:    Problems with blood clotting too easily:        Skin    Rashes or ulcers:        Constitutional    Fever or chills:      PHYSICAL EXAMINATION:  There were no vitals filed for this visit.   General:  WDWN in NAD; vital signs documented above Gait: Not observed HENT: WNL, normocephalic Pulmonary: normal non-labored breathing , without Rales, rhonchi,  wheezing Cardiac: regular HR Abdomen: soft, NT, no masses Skin: without rashes Vascular Exam/Pulses: Palpable left DP; symmetrical 2+ femoral pulses; absent right pedal pulses Extremities: without ischemic changes, without Gangrene , without cellulitis; without open wounds;  Musculoskeletal: no muscle wasting or atrophy  Neurologic: A&O X 3;  No focal weakness or paresthesias are detected Psychiatric:  The pt  has Normal affect.   Non-Invasive Vascular Imaging:    Abdominal Aorta Findings:  +-------------+-------+----------+----------+----------+--------+--------+  Location    AP (cm)Trans (cm)PSV (cm/s)Waveform  ThrombusComments  +-------------+-------+----------+----------+----------+--------+--------+  Distal                       57                                    +-------------+-------+----------+----------+----------+--------+--------+  RT CIA Prox                   82        monophasic                  +-------------+-------+----------+----------+----------+--------+--------+  RT CIA Mid                    75        monophasic                  +-------------+-------+----------+----------+----------+--------+--------+  RT CIA Distal                 86        monophasic                  +-------------+-------+----------+----------+----------+--------+--------+  RT EIA Prox                   115       monophasic                  +-------------+-------+----------+----------+----------+--------+--------+  RT EIA Mid                    258       monophasic                  +-------------+-------+----------+----------+----------+--------+--------+  RT EIA Distal                 164       biphasic                    +-------------+-------+----------+----------+----------+--------+--------+  LT CIA Prox                   71        monophasic                  +-------------+-------+----------+----------+----------+--------+--------+  LT CIA Mid                    104       biphasic                    +-------------+-------+----------+----------+----------+--------+--------+  LT CIA Distal                 96        biphasic                    +-------------+-------+----------+----------+----------+--------+--------+  LT EIA Prox                   167       biphasic                     +-------------+-------+----------+----------+----------+--------+--------+  LT EIA Mid                    209       biphasic                    +-------------+-------+----------+----------+----------+--------+--------+  LT EIA Distal                 222       biphasic                    +-------------+-------+----------+----------+----------+--------+--------+   ABI Findings:  +---------+------------------+-----+----------+--------+  Right   Rt Pressure (mmHg)IndexWaveform  Comment   +---------+------------------+-----+----------+--------+  Brachial 142                                        +---------+------------------+-----+----------+--------+  ATA     175               1.12 monophasic          +---------+------------------+-----+----------+--------+  PTA     255               1.63 monophasic          +---------+------------------+-----+----------+--------+  Great Toe57                0.37                     +---------+------------------+-----+----------+--------+   +---------+------------------+-----+----------+-------+  Left    Lt Pressure (mmHg)IndexWaveform  Comment  +---------+------------------+-----+----------+-------+  Brachial 156                                       +---------+------------------+-----+----------+-------+  ATA     255               1.63 biphasic           +---------+------------------+-----+----------+-------+  PTA     255               1.63 monophasic         +---------+------------------+-----+----------+-------+  Great Toe97                0.62                    +---------+------------------+-----+----------+-------+   +-------+-----------+-----------+------------+------------+  ABI/TBIToday's ABIToday's TBIPrevious ABIPrevious TBI  +-------+-----------+-----------+------------+------------+  Right [place]         0.37       [place]          0.46           +-------+-----------+-----------+------------+------------+  Left  [place]         0.62       [place]          0.74          +-------+-----------+-----------+------------+------------+   ASSESSMENT/PLAN:: 78 y.o. male here for follow up for surveillance of PAD and carotid artery stenosis.  From a cerebrovascular standpoint-I am following with yearly ultrasounds.  This has been scheduled for his next 39-month follow-up  Regarding his lower extremities, on physical exam he had a palpable pulse in bilateral femoral arteries, palpable DP in the left foot.  Imaging studies were reviewed including aortic duplex ultrasound, left lower extremity bypass duplex ultrasound, ABI all 3 of the studies demonstrated no significant changes.  Stents are widely patent, bypass widely patent, ABI unchanged.  My plan is to follow him in 27-month intervals at this point.  He was asked, office should any questions or concerns arise.    Fonda FORBES Rim, MD [facility] [phone] Total time of patient care including pre-visit research, consultation, and documentation greater than 30 minutes

## 2024-05-31 ENCOUNTER — Ambulatory Visit (HOSPITAL_BASED_OUTPATIENT_CLINIC_OR_DEPARTMENT_OTHER)
Admission: RE | Admit: 2024-05-31 | Discharge: 2024-05-31 | Disposition: A | Discharge status: Home / Self Care | Source: Ambulatory Visit | Attending: Vascular Surgery | Admitting: Vascular Surgery

## 2024-05-31 ENCOUNTER — Ambulatory Visit: Admitting: Vascular Surgery

## 2024-05-31 ENCOUNTER — Ambulatory Visit (HOSPITAL_BASED_OUTPATIENT_CLINIC_OR_DEPARTMENT_OTHER)
Admission: RE | Admit: 2024-05-31 | Discharge: 2024-05-31 | Disposition: A | Source: Ambulatory Visit | Attending: Vascular Surgery

## 2024-05-31 ENCOUNTER — Encounter: Payer: Self-pay | Admitting: Vascular Surgery

## 2024-05-31 ENCOUNTER — Ambulatory Visit (HOSPITAL_COMMUNITY)
Admission: RE | Admit: 2024-05-31 | Discharge: 2024-05-31 | Disposition: A | Discharge status: Home / Self Care | Source: Ambulatory Visit | Attending: Vascular Surgery | Admitting: Vascular Surgery

## 2024-05-31 VITALS — BP 179/80 | HR 78 | Temp 97.8°F | Resp 20 | Ht 68.0 in | Wt 148.8 lb

## 2024-05-31 DIAGNOSIS — I739 Peripheral vascular disease, unspecified: Secondary | ICD-10-CM | POA: Insufficient documentation

## 2024-05-31 DIAGNOSIS — I6522 Occlusion and stenosis of left carotid artery: Secondary | ICD-10-CM

## 2024-05-31 DIAGNOSIS — Z95828 Presence of other vascular implants and grafts: Secondary | ICD-10-CM

## 2024-05-31 LAB — VAS US ABI WITH/WO TBI

## 2024-05-31 NOTE — Telephone Encounter (Signed)
 Returned call, no answer. Lvm for call back.

## 2024-06-01 ENCOUNTER — Other Ambulatory Visit: Payer: Self-pay | Admitting: *Deleted

## 2024-06-01 DIAGNOSIS — I739 Peripheral vascular disease, unspecified: Secondary | ICD-10-CM

## 2024-06-01 DIAGNOSIS — Z95828 Presence of other vascular implants and grafts: Secondary | ICD-10-CM

## 2024-06-08 NOTE — Telephone Encounter (Signed)
 Spoke to pt. Over phone to give results.

## 2024-06-19 ENCOUNTER — Other Ambulatory Visit

## 2024-06-19 DIAGNOSIS — I10 Essential (primary) hypertension: Secondary | ICD-10-CM

## 2024-06-19 DIAGNOSIS — K5900 Constipation, unspecified: Secondary | ICD-10-CM

## 2024-06-19 DIAGNOSIS — R790 Abnormal level of blood mineral: Secondary | ICD-10-CM

## 2024-06-19 LAB — CBC WITH DIFFERENTIAL/PLATELET
Absolute Lymphocytes: 2141 {cells}/uL (ref 850–3900)
Absolute Monocytes: 793 {cells}/uL (ref 200–950)
Basophils Absolute: 54 {cells}/uL (ref 0–200)
Basophils Relative: 0.7 %
Eosinophils Absolute: 200 {cells}/uL (ref 15–500)
Eosinophils Relative: 2.6 %
HCT: 47.4 % (ref 39.4–51.1)
Hemoglobin: 15.5 g/dL (ref 13.2–17.1)
MCH: 31.4 pg (ref 27.0–33.0)
MCHC: 32.7 g/dL (ref 31.6–35.4)
MCV: 96.1 fL (ref 81.4–101.7)
MPV: 8.7 fL (ref 7.5–12.5)
Monocytes Relative: 10.3 %
Neutro Abs: 4512 {cells}/uL (ref 1500–7800)
Neutrophils Relative %: 58.6 %
Platelets: 313 Thousand/uL (ref 140–400)
RBC: 4.93 Million/uL (ref 4.20–5.80)
RDW: 13.5 % (ref 11.0–15.0)
Total Lymphocyte: 27.8 %
WBC: 7.7 Thousand/uL (ref 3.8–10.8)

## 2024-06-20 LAB — IRON,TIBC AND FERRITIN PANEL
%SAT: 20 % (ref 20–48)
Ferritin: 40 ng/mL (ref 24–380)
Iron: 80 ug/dL (ref 50–180)
TIBC: 398 ug/dL (ref 250–425)

## 2024-06-21 ENCOUNTER — Ambulatory Visit: Payer: Self-pay | Admitting: Family Medicine

## 2024-07-05 NOTE — Progress Notes (Unsigned)
°  Cardiology Office Note:  .   Date:  07/05/2024  ID:  Gregory Eaton, DOB [birth year], MRN [id number] PCP: Gregory Jeoffrey RAMAN, FNP  [facility] Providers Cardiologist:  Gregory Carrier, MD { Click to update primary MD,subspecialty MD or APP then REFRESH:1}   History of Present Illness: .   Gregory Eaton is a 78 y.o. male  with PMHx of CAD (s/p CABG in 1998, low-risk NST in 07/2013 and 01/2021), PAD (s/p fem-pop bypass in 1993 and known occluded right SFA, right iliofemoral endarterectomy in 03/2021, left fem-pop bypass in 04/2021 and angioplasty of right external iliac artery in 08/2022), prior ablation at [facility] in the 1990's, HTN, HLD, carotid artery stenosis, COPD and history of esophageal cancer  who reports to [facility] office for follow up.   Pertinent cardiac medical history:    Last seen in heartcare 12/30/2023 with Gregory Qua, PA-C for 2 month follow up.   Recent hospitalization   Today, reports ### and denies ###.  Denies chest pain, shortness of breath, palpitations, syncope, presyncope, dizziness, orthopnea, PND, swelling or significant weight changes, acute bleeding, or claudication.   Reports compliance with medications.  Dietary habitats:  Activity level:  Social: Denies tobacco use/alcohol/drug use  Denies any recent hospitalizations or visits to the emergency department.   ROS: 10 point review of system has been reviewed and considered negative except ones been listed in the HPI.   Studies Reviewed: .        *** Risk Assessment/Calculations:   {Does this patient have ATRIAL FIBRILLATION?:[phone]} No BP recorded.  {Refresh Note OR Click here to enter BP  :1}***       Physical Exam:   VS:  There were no vitals taken for this visit.   Wt Readings from Last 3 Encounters:  05/31/24 148 lb 12.8 oz (67.5 kg)  05/15/24 149 lb 8 oz (67.8 kg)  03/21/24 156 lb (70.8 kg)    GEN: Well nourished, well developed in no acute distress while sitting in chair.   NECK: No JVD; No carotid bruits CARDIAC: ***RRR, no murmurs, rubs, gallops RESPIRATORY:  Clear to auscultation without rales, wheezing or rhonchi  ABDOMEN: Soft, non-tender, non-distended EXTREMITIES:  No edema; No deformity   ASSESSMENT AND PLAN: .   ***    {Are you ordering a CV Procedure (e.g. stress test, cath, DCCV, TEE, etc)?   Press F2        :[phone]}  Dispo: ***  Signed, Lorette CINDERELLA Kapur, PA-C

## 2024-07-09 ENCOUNTER — Ambulatory Visit: Attending: Physician Assistant | Admitting: Physician Assistant

## 2024-07-09 ENCOUNTER — Encounter: Payer: Self-pay | Admitting: Physician Assistant

## 2024-07-09 VITALS — BP 136/76 | HR 76 | Ht 68.0 in | Wt 152.0 lb

## 2024-07-09 DIAGNOSIS — I739 Peripheral vascular disease, unspecified: Secondary | ICD-10-CM | POA: Diagnosis not present

## 2024-07-09 DIAGNOSIS — R55 Syncope and collapse: Secondary | ICD-10-CM | POA: Diagnosis not present

## 2024-07-09 DIAGNOSIS — I10 Essential (primary) hypertension: Secondary | ICD-10-CM | POA: Diagnosis not present

## 2024-07-09 DIAGNOSIS — E785 Hyperlipidemia, unspecified: Secondary | ICD-10-CM

## 2024-07-09 DIAGNOSIS — I2510 Atherosclerotic heart disease of native coronary artery without angina pectoris: Secondary | ICD-10-CM | POA: Diagnosis not present

## 2024-07-09 DIAGNOSIS — Z951 Presence of aortocoronary bypass graft: Secondary | ICD-10-CM | POA: Diagnosis not present

## 2024-07-09 DIAGNOSIS — I6523 Occlusion and stenosis of bilateral carotid arteries: Secondary | ICD-10-CM

## 2024-07-09 NOTE — Patient Instructions (Signed)
 Medication Instructions:   Your physician recommends that you continue on your current medications as directed. Please refer to the Current Medication list given to you today.   Labwork: None today  Testing/Procedures: None today  Follow-Up: 6 months Dr.Branch  Any Other Special Instructions Will Be Listed Below (If Applicable).  If you need a refill on your cardiac medications before your next appointment, please call your pharmacy.    Thank you for choosing [facility] !

## 2024-07-16 ENCOUNTER — Ambulatory Visit (INDEPENDENT_AMBULATORY_CARE_PROVIDER_SITE_OTHER)

## 2024-07-16 DIAGNOSIS — Z23 Encounter for immunization: Secondary | ICD-10-CM

## 2024-07-16 NOTE — Progress Notes (Signed)
 Patient is in office today for a nurse visit for Shingles #2 Immunization. Patient Injection was given in the  Right deltoid. Patient tolerated injection well.

## 2024-07-24 ENCOUNTER — Encounter: Payer: Self-pay | Admitting: Gastroenterology

## 2024-07-24 ENCOUNTER — Ambulatory Visit: Admitting: Gastroenterology

## 2024-07-24 VITALS — BP 158/88 | HR 71 | Temp 98.6°F | Ht 68.0 in | Wt 154.8 lb

## 2024-07-24 DIAGNOSIS — Z8501 Personal history of malignant neoplasm of esophagus: Secondary | ICD-10-CM

## 2024-07-24 DIAGNOSIS — D509 Iron deficiency anemia, unspecified: Secondary | ICD-10-CM

## 2024-07-24 DIAGNOSIS — Z860101 Personal history of adenomatous and serrated colon polyps: Secondary | ICD-10-CM | POA: Diagnosis not present

## 2024-07-24 DIAGNOSIS — K2270 Barrett's esophagus without dysplasia: Secondary | ICD-10-CM

## 2024-07-24 DIAGNOSIS — K2100 Gastro-esophageal reflux disease with esophagitis, without bleeding: Secondary | ICD-10-CM

## 2024-07-24 DIAGNOSIS — K219 Gastro-esophageal reflux disease without esophagitis: Secondary | ICD-10-CM | POA: Diagnosis not present

## 2024-07-24 DIAGNOSIS — C155 Malignant neoplasm of lower third of esophagus: Secondary | ICD-10-CM

## 2024-07-24 DIAGNOSIS — Z87891 Personal history of nicotine dependence: Secondary | ICD-10-CM

## 2024-07-24 NOTE — Progress Notes (Signed)
 "    GI Office Note    Referring Provider: Kayla Jeoffrey RAMAN, FNP Primary Care Physician:  Kayla Jeoffrey RAMAN, FNP  Primary Gastroenterologist: Ozell Hollingshead, MD   Chief Complaint   Chief Complaint  Patient presents with   abnormal labs     Labs came back abnormal after surgery     History of Present Illness   Gregory Eaton is a 79 y.o. male presenting today for further evaluation of abnormal lab work.  Patient was last seen in May 2025.  He has a history of esophageal carcinoma in 2010 status post esophagectomy with gastric pull-up, Barrett's esophagus diagnosed in 2019 with no dysplasia, GERD, colonic adenomas.  Patient underwent C3-7 laminectomy and fusion 04/09/2024.  Prior to surgery on 03/27/2024 his hemoglobin was 14.9.  On April 10, 2024 his hemoglobin was 12.7.  Follow-up labs by PCP on 05/15/2024, hemoglobin 12.6, iron 41, ferritin 14, iron sats 10%. PCP started him on ferrous sulfate 325 mg daily.  Stool Hemoccult was negative.  Follow-up labs with normalized hemoglobin of 15.5, iron 80, ferritin 40, TIBC 398 on 06/19/2024  Discussed the use of AI scribe software for clinical note transcription with the patient, who gave verbal consent to proceed.  History of Present Illness   Three months ago, he had posterior neck surgery with a difficult recovery and limited activity, though pain and mobility are gradually improving.  After surgery, he had black stools for about two weeks without hematochezia and without use of bismuth or iron. A stool test in 05/2024 was negative for blood. Bowel movements are now regular with no melena or other gastrointestinal symptoms.   He started iron after his 05/15/24 labs. Took it for about two weeks, then stopped because of concern about blood pressure. He is not currently taking iron.  He currently has no dysphagia, regurgitation, heartburn, or other esophageal concerns. His BMs are good. No melena, brbpr. No abdominal pain.   Hypertension is  monitored at home with systolic readings sometimes 160 to 170 mmHg. Discussed with cardiology recently and they advised him to start checking his BPs two hours after taking his hypertensive medication. He presents today with elevated BP but has not taken his medications.   Patient is reestablishing with his previous PCP at the end of the month Carylon Heller, NP). He likes his current PCP but he wants to establish with local provider for fear of having to change PCP again if current provider relocates. He has been very satisfied with his care.     Prior Data     Results       Latest Ref Rng & Units 06/19/2024    8:48 AM 05/15/2024    8:23 AM 01/16/2024    8:37 AM  CBC  WBC 3.8 - 10.8 Thousand/uL 7.7  7.0  6.8   Hemoglobin 13.2 - 17.1 g/dL 84.4  87.3  84.7   Hematocrit 39.4 - 51.1 % 47.4  39.8  47.6   Platelets 140 - 400 Thousand/uL 313  393  297         Latest Ref Rng & Units 05/15/2024    8:23 AM 01/16/2024    8:37 AM 07/19/2023   10:36 AM  Hepatic Function  Total Protein 6.1 - 8.1 g/dL 6.7  6.9  7.5   AST 10 - 35 U/L 13  16  19    ALT 9 - 46 U/L 12  18  22    Total Bilirubin 0.2 - 1.2 mg/dL 0.3  0.3  0.5    Lab Results  Component Value Date   CREATININE 0.86 05/15/2024   CREATININE 0.87 01/16/2024   CREATININE 0.82 09/12/2023    May 15, 2024: Ferritin 14, iron sat 10, iron 41, TIBC 409, B12 1110 May 23, 2024: Stool Hemoccult negative June 19, 2024: Iron 80, ferritin 40, TIBC 398, iron sats 20%   Colonoscopy April 2023: -Diverticulosis -two 3 to 4 mm polyps removed from the hepatic flexure -Tubular adenomas -No future colonoscopy recommended unless new symptoms develop   EGD April 2023: -Status post esophagectomy with gastric pull-up -Abnormal mucosa at anastomosis consistent with Barrett's esophagus -Esophageal biopsies with moderate chronic gastritis with foveolar hyperplasia and intestinal metaplasia.  Scant reactive squamous epithelium.  Negative  for dysplasia and carcinoma.  Findings consistent with Barrett's esophagus. -No future endoscopy recommended unless new symptoms develop     Medications   Current Outpatient Medications  Medication Sig Dispense Refill   acetaminophen  (TYLENOL ) 500 MG tablet Take 1,000 mg by mouth daily as needed for headache.     albuterol  (PROVENTIL ) (2.5 MG/3ML) 0.083% nebulizer solution USE ONE VIAL (2.5MG  TOTAL) IN NEBULIZER EVERY SIX HOURS AS NEEDED FOR WHEEZING OR SHORTNESS OF BREATH 75 mL 6   albuterol  (VENTOLIN  HFA) 108 (90 Base) MCG/ACT inhaler Inhale 2 puffs into the lungs every 4 (four) hours as needed for wheezing or shortness of breath. 1 each 0   amLODipine  (NORVASC ) 5 MG tablet Take 1 tablet (5 mg total) by mouth daily. 180 tablet 3   baclofen  (LIORESAL ) 10 MG tablet Take 1 tablet (10 mg total) by mouth 3 (three) times daily as needed for muscle spasms. 20 each 0   brimonidine  (ALPHAGAN ) 0.2 % ophthalmic solution SMARTSIG:In Eye(s)     clopidogrel  (PLAVIX ) 75 MG tablet Take 1 tablet (75 mg total) by mouth daily. 90 tablet 3   fluticasone  (FLONASE) 50 MCG/ACT nasal spray Place 1 spray into both nostrils daily.     lansoprazole  (PREVACID ) 30 MG capsule Take 1 capsule (30 mg total) by mouth daily before supper. 90 capsule 3   latanoprost  (XALATAN ) 0.005 % ophthalmic solution SMARTSIG:In Eye(s)     losartan  (COZAAR ) 100 MG tablet Take 1 tablet (100 mg total) by mouth at bedtime. 90 tablet 3   metoprolol  succinate (TOPROL -XL) 50 MG 24 hr tablet Take 1 tablet (50 mg total) by mouth daily. Take with or immediately following a meal. 90 tablet 3   montelukast  (SINGULAIR ) 10 MG tablet TAKE ONE TABLET (10MG  TOTAL) BY MOUTH DAILY 90 tablet 0   Multiple Vitamins-Minerals (CENTRUM SILVER PO) Take 1 tablet by mouth daily.     nitroGLYCERIN  (NITROSTAT ) 0.4 MG SL tablet Place 1 tablet (0.4 mg total) under the tongue every 5 (five) minutes as needed for chest pain. 25 tablet 0   ondansetron  (ZOFRAN ) 4 MG tablet  Take 4 mg by mouth.     rosuvastatin  (CRESTOR ) 10 MG tablet Take 1 tablet (10 mg total) by mouth daily. 90 tablet 3   spironolactone  (ALDACTONE ) 25 MG tablet TAKE 0.5 TABLETS (12.5 MG TOTATL) BY MOUTH DAILY 45 tablet 3   sucralfate  (CARAFATE ) 1 g tablet Take 1 tablet (1 g total) by mouth 4 (four) times daily -  with meals and at bedtime. As needed for esophageal pain, heartburn. You can crush in couple of tablespoons of water  to make suspension. 120 tablet 5   traZODone  (DESYREL ) 50 MG tablet Take 0.5-1 tablets (25-50 mg total) by mouth at bedtime as needed for sleep.  90 tablet 1   umeclidinium-vilanterol (ANORO ELLIPTA ) 62.5-25 MCG/ACT AEPB Inhale 1 puff into the lungs daily. 60 each 11   No current facility-administered medications for this visit.    Allergies   Allergies as of 07/24/2024 - Review Complete 07/24/2024  Allergen Reaction Noted   Altace [ramipril] Cough 04/07/2014     Past Medical History   Past Medical History:  Diagnosis Date   Adenomatous colon polyp 03/2009   Last colonoscopy by Dr. Shaaron    Adenomatous polyp 2010   Adenomatous polyp of colon 11/03/2010   Arthritis    Barrett's esophagus    CAD (coronary artery disease)    Cataract    Complication of anesthesia    has a shortened esophogus due to CA.    COPD (chronic obstructive pulmonary disease) (HCC)    Severe emphysema per CT   Diverticulosis    Emphysema of lung (HCC)    Esophageal carcinoma (HCC) 03/2009   T1N1M0   GERD (gastroesophageal reflux disease)    Glaucoma    Heart murmur    History of Doppler ultrasound 11/09/2011   03/2014- 50-69% L ICA stenosis;carotid doppler; L bulb/prox ICA 0-49% diameter reduction; L vertebral artery - occlusive ds; L ECA  demonstrates severe amount of fibrous plaque   History of Doppler ultrasound 11/09/2011   LEAs; R ABI - mod art. insuff.; L ABI normal at rest; R SFA - occlusive ds; L SFA - occlusive ds; patent fem-pop graft   History of echocardiogram 08/27/2009    EF >55%   History of hiatal hernia    History of kidney stones 1965   History of nuclear stress test 11/24/2011   lexiscan ; normal perfusion; low risk scan; non-diagnostic for ischemia   Hyperlipidemia    Hypertension    Left carotid artery stenosis 04/08/2014   Pneumonia    Pre-diabetes    Pulmonary nodule, right 04/08/2014   2.8 mm-incidental finding on CT   PVD (peripheral vascular disease)    Tachyarrhythmia 1999   Status post ablation at [facility]   Tobacco abuse     Past Surgical History   Past Surgical History:  Procedure Laterality Date   ABDOMINAL AORTOGRAM W/LOWER EXTREMITY N/A 04/01/2021   Procedure: ABDOMINAL AORTOGRAM W/LOWER EXTREMITY;  Surgeon: Lanis Fonda BRAVO, MD;  Location: [facility] INVASIVE CV LAB;  Service: Cardiovascular;  Laterality: N/A;   ABDOMINAL AORTOGRAM W/LOWER EXTREMITY N/A 09/01/2022   Procedure: ABDOMINAL AORTOGRAM W/LOWER EXTREMITY;  Surgeon: Lanis Fonda BRAVO, MD;  Location: [facility] INVASIVE CV LAB;  Service: Cardiovascular;  Laterality: N/A;   BIOPSY  10/24/2017   Procedure: BIOPSY;  Surgeon: Shaaron Lamar HERO, MD;  Location: [facility];  Service: Endoscopy;;  esophagus   BIOPSY  10/22/2021   Procedure: BIOPSY;  Surgeon: Shaaron Lamar HERO, MD;  Location: [facility];  Service: Endoscopy;;   CATARACT EXTRACTION Bilateral    COLONOSCOPY  03/17/2009   Dr.Rourk- normal rectum, sigmoid diverticula, some pale sigmoid mucosa with diffuse petechiae. pedunculated polyp at the splenic flexure, remainder of colonic mucosa appeared normal. bx= adenomatous polyp   COLONOSCOPY  04/19/2003   Dr.Rehman- few diverticu;a at the sigmoid colon, 3 small polyps, one at the transverse colon and 2 at the sigmoid, small external hemorrhoids. bx report not available.    COLONOSCOPY WITH PROPOFOL  N/A 10/24/2017   Dr. Shaaron: Diverticulosis, 11 mm polyp at the ileocecal valve, tubular adenoma.  Repeat colonoscopy in 3 years   COLONOSCOPY WITH PROPOFOL  N/A 10/22/2021   Procedure:  COLONOSCOPY WITH  PROPOFOL ;  Surgeon: Shaaron Lamar HERO, MD;  Location: [facility];  Service: Endoscopy;  Laterality: N/A;  11:00am   CORONARY ARTERY BYPASS GRAFT  1998   [name]   ENDARTERECTOMY FEMORAL Right 04/13/2021   Procedure: RIGHT ILIOFEMORAL ARTERY ENDARTERECTOMY WITH PATCH ANGIOPLASTY USING HEMASHIELD PALTINUM FINESSE DACRON PATCH;  Surgeon: Lanis Fonda BRAVO, MD;  Location: [facility] OR;  Service: Vascular;  Laterality: Right;   ESOPHAGECTOMY  2010   [facility] Dr. Garnet   ESOPHAGOGASTRODUODENOSCOPY  11/25/2010   Dr.Rourk- s/p esophagectomy with gastric pull-up, esophageal erosions straddling the surgical anastomosis, salmon colored epithelium coming up a good centimeter to a centimeter and a half above the suture line, islands of salmon colored epithelium in the most poximal residual esophagus, remainder of gastric mucosa appeared normal. bx= swamous &gastric glandular mucosa w/chronic active inflammation   ESOPHAGOGASTRODUODENOSCOPY  03/17/2009   Dr.Rourk- 4cm segment of salmon-colored epithlium distal esophagus suspicious for barretts esophagus. area of suspicious nodularity w/in this segment bx seperately. small to moderate size hiatal hernia, o/w normal stomach D1 and D2 bx=adenocarcinoma   ESOPHAGOGASTRODUODENOSCOPY (EGD) WITH PROPOFOL  N/A 10/24/2017   Dr. Shaaron: Remnant of esophagus with anastomosis with stomach at 24 cm from the incisors, 2 cm above the anastomosis he was found to have Barrett's but no dysplasia.  Advised for repeat EGD in 3 years   ESOPHAGOGASTRODUODENOSCOPY (EGD) WITH PROPOFOL  N/A 10/22/2021   Procedure: ESOPHAGOGASTRODUODENOSCOPY (EGD) WITH PROPOFOL ;  Surgeon: Shaaron Lamar HERO, MD;  Location: [facility];  Service: Endoscopy;  Laterality: N/A;   EYE SURGERY     cataract   FEMORAL-POPLITEAL BYPASS GRAFT  1993   occlusive ds in R SFA   FEMORAL-POPLITEAL BYPASS GRAFT Left 05/11/2021   Procedure: LEFT FEMORAL-BELOW KNEE POPLITEAL ARTERY BYPASS GRAFT WITH 6 mm PTFE  and COMPLETION ANGIOGRAM;  Surgeon: Lanis Fonda BRAVO, MD;  Location: [facility] OR;  Service: Vascular;  Laterality: Left;   GASTRIC PULL THROUGH  2010   With esophagectomy   INSERTION OF ILIAC STENT Bilateral 04/13/2021   Procedure: INSERTION OF BILATERAL COMMON ILIAC KISSING STENTS AND INSERION OF RIGHT EXTERNAL ILIAC STENT;  Surgeon: Lanis Fonda BRAVO, MD;  Location: [facility] OR;  Service: Vascular;  Laterality: Bilateral;   JOINT REPLACEMENT     LIPOMA EXCISION  2010   LOWER EXTREMITY ANGIOGRAM Left 04/13/2021   Procedure: LEFT LOWER EXTREMITY ANGIOGRAM ;  Surgeon: Lanis Fonda BRAVO, MD;  Location: [facility] OR;  Service: Vascular;  Laterality: Left;   PERIPHERAL VASCULAR INTERVENTION  04/01/2021   Procedure: PERIPHERAL VASCULAR INTERVENTION;  Surgeon: Lanis Fonda BRAVO, MD;  Location: [facility] INVASIVE CV LAB;  Service: Cardiovascular;;   PERIPHERAL VASCULAR INTERVENTION Right 09/01/2022   Procedure: PERIPHERAL VASCULAR INTERVENTION;  Surgeon: Lanis Fonda BRAVO, MD;  Location: [facility] INVASIVE CV LAB;  Service: Cardiovascular;  Laterality: Right;  right external iliac   POLYPECTOMY  10/24/2017   Procedure: POLYPECTOMY;  Surgeon: Shaaron Lamar HERO, MD;  Location: [facility];  Service: Endoscopy;;  colon    POLYPECTOMY  10/22/2021   Procedure: POLYPECTOMY;  Surgeon: Shaaron Lamar HERO, MD;  Location: [facility];  Service: Endoscopy;;   TOTAL HIP ARTHROPLASTY Left 01/30/2019   Procedure: TOTAL HIP ARTHROPLASTY ANTERIOR APPROACH;  Surgeon: Beverley Evalene BIRCH, MD;  Location: [facility] ORS;  Service: Orthopedics;  Laterality: Left;   ULTRASOUND GUIDANCE FOR VASCULAR ACCESS Left 04/13/2021   Procedure: ULTRASOUND GUIDANCE FOR VASCULAR ACCESS, left femoral;  Surgeon: Lanis Fonda BRAVO, MD;  Location: [facility] OR;  Service: Vascular;  Laterality: Left;  Past Family History   Family History  Problem Relation Age of Onset   Stroke Mother    GER disease Mother    Congenital heart disease Sister    Coronary artery disease Brother    Colon cancer  Neg Hx     Past Social History   Social History   Socioeconomic History   Marital status: Married    Spouse name: Not on file   Number of children: 2   Years of education: Not on file   Highest education level: 8th grade  Occupational History   Occupation: retired    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  Tobacco Use   Smoking status: Former    Current packs/day: 0.00    Average packs/day: 2.0 packs/day for 40.0 years (80.0 ttl pk-yrs)    Types: Cigarettes    Start date: 27    Quit date: 07/20/1995    Years since quitting: 29.0    Passive exposure: Never   Smokeless tobacco: Never  Vaping Use   Vaping status: Never Used  Substance and Sexual Activity   Alcohol use: No    Alcohol/week: 0.0 standard drinks of alcohol   Drug use: No   Sexual activity: Yes    Partners: Female    Birth control/protection: Condom    Comment: friend  Other Topics Concern   Not on file  Social History Narrative   Not on file   Social Drivers of Health   Tobacco Use: Medium Risk (07/24/2024)   Patient History    Smoking Tobacco Use: Former    Smokeless Tobacco Use: Never    Passive Exposure: Never  Physicist, Medical Strain: Patient Declined (05/10/2024)   Overall Financial Resource Strain (CARDIA)    Difficulty of Paying Living Expenses: Patient declined  Food Insecurity: Patient Declined (05/10/2024)   Epic    Worried About Programme Researcher, Broadcasting/film/video in the Last Year: Patient declined    Barista in the Last Year: Patient declined  Transportation Needs: Patient Declined (05/10/2024)   Epic    Lack of Transportation (Medical): Patient declined    Lack of Transportation (Non-Medical): Patient declined  Physical Activity: Unknown (05/10/2024)   Exercise Vital Sign    Days of Exercise per Week: Patient declined    Minutes of Exercise per Session: Not on file  Recent Concern: Physical Activity - Insufficiently Active (03/21/2024)   Exercise Vital Sign    Days of Exercise per  Week: 3 days    Minutes of Exercise per Session: 30 min  Stress: Patient Declined (05/10/2024)   [employer] of Occupational Health - Occupational Stress Questionnaire    Feeling of Stress: Patient declined  Social Connections: Unknown (05/10/2024)   Social Connection and Isolation Panel    Frequency of Communication with Friends and Family: Patient declined    Frequency of Social Gatherings with Friends and Family: Patient declined    Attends Religious Services: Patient declined    Database Administrator or Organizations: Patient declined    Attends Banker Meetings: Not on file    Marital Status: Patient declined  Intimate Partner Violence: Not At Risk (04/09/2024)   Received from [facility]   HITS    Over the last 12 months how often did your partner physically hurt you?: Never    Over the last 12 months how often did your partner insult you or talk down to you?: Never    Over the last 12 months how often  did your partner threaten you with physical harm?: Never    Over the last 12 months how often did your partner scream or curse at you?: Never  Depression (PHQ2-9): Low Risk (05/15/2024)   Depression (PHQ2-9)    PHQ-2 Score: 0  Alcohol Screen: Low Risk (03/21/2024)   Alcohol Screen    Last Alcohol Screening Score (AUDIT): 0  Housing: Patient Declined (05/10/2024)   Epic    Unable to Pay for Housing in the Last Year: Patient declined    Number of Times Moved in the Last Year: Not on file    Homeless in the Last Year: Patient declined  Utilities: Not At Risk (04/09/2024)   Received from [facility]    In the past 12 months has the electric, gas, oil, or water  company threatened to shut off services in your home?: No  Health Literacy: Adequate Health Literacy (03/21/2024)   B1300 Health Literacy    Frequency of need for help with medical instructions: Never    Review of Systems   General: Negative for anorexia, weight loss, fever, chills, fatigue,  weakness. ENT: Negative for hoarseness, difficulty swallowing , nasal congestion. CV: Negative for chest pain, angina, palpitations, dyspnea on exertion, peripheral edema.  Respiratory: Negative for dyspnea at rest, dyspnea on exertion, cough, sputum, wheezing.  GI: See history of present illness. GU:  Negative for dysuria, hematuria, urinary incontinence, urinary frequency, nocturnal urination.  Endo: Negative for unusual weight change.     Physical Exam   BP (!) 158/88 (BP Location: Left Arm, Patient Position: Sitting, Cuff Size: Normal)   Pulse 71   Temp 98.6 F (37 C) (Temporal)   Ht 5' 8 (1.727 m)   Wt 154 lb 12.8 oz (70.2 kg)   BMI 23.54 kg/m    General: Well-nourished, well-developed in no acute distress.  Eyes: No icterus. Mouth: Oropharyngeal mucosa moist and pink   Heart: Regular rate and rhythm, no murmurs rubs or gallops.  Abdomen: Bowel sounds are normal, nontender, nondistended, no hepatosplenomegaly or masses,  no abdominal bruits or hernia , no rebound or guarding.  Rectal: not performed Extremities: No lower extremity edema. No clubbing or deformities. Neuro: Alert and oriented x 4   Skin: Warm and dry, no jaundice.   Psych: Alert and cooperative, normal mood and affect.  Labs   See above  Imaging Studies   No results found.  Assessment/Plan:    Assessment & Plan Iron deficiency anemia: - Hemoglobin 14.9 on 03/27/2024 prior to C3-7 laminectomy and fusion on 04/09/2024. - Hemoglobin 12.7 on 04/10/2024. -Hemoglobin 12.6 on 05/15/2024, iron 41, ferritin 14, iron sats 10% -Took ferrous sulfate for 2 weeks, patient discontinued suspecting as cause of elevated blood pressure Hemoglobin 12.5 on 06/19/2024, iron 80, ferritin, TIBC 398  - Stool Hemoccult negative -Patient reports black stools for couple of weeks after his surgery but this subsided -History of remote esophageal cancer status post esophagectomy with gastric pull-through, subsequent diagnosis of  Barrett's esophagus without dysplasia in 2019 with last EGD in 2023, history of colonic adenomas with last colonoscopy in April 2023 -Patient still recovering from cervical laminectomy/fusion -It is not clear that he needs any further endoscopic evaluation at this time, I suspect his anemia related to postop state, not clear regarding reported melena postoperatively but currently patient has been asymptomatic for several months. -patient to update labs with PCP at end of month, if not he has our orders to check cbc, iron/tibc/ferritin -he we will monitor for recurrent  black stools, bloody stools, abdominal pain, esophageal concerns, weight loss - Encouraged patient to keep blood pressure log for both arms, share with PCP at upcoming appointment - He was advised to stay off of iron for right now - Continue lansoprazole  30 mg daily before supper - To discuss with Dr. Shaaron regarding any need for updating endoscopy      GERD/Barrett's esophagus/prior history of esophageal cancer -clinically doing well/stable -continue PPI daily     Total time spent 35 minutes with review of chart, evaluation of patient, education, documentation.   Sonny RAMAN. Ezzard, MHS, PA-C [facility]  "

## 2024-07-24 NOTE — Patient Instructions (Addendum)
 Please take labs with you to your visit with Charmaine Heller, NP. If she is able to do your labs, please let me know once completed so I can locate results.  Please keep log of your blood pressures (in both arms) and share with PCP.  Monitor for recurrent black stools, bloody stools, abdominal pain, esophageal concerns, weight loss. If you have any symptoms, please let me know. Right now I suspect Dr. Shaaron will agree with plan to monitor your blood counts before committing to endoscopic work up. I will let you know if he has any concerns and wants to pursue endoscopy sooner. Of course if your labs show recurrent anemia or you develop symptoms, then we will need to pursue work up. It is ok to stay off the iron supplement for now. Continue lansoprazole  30mg  daily before supper.

## 2024-11-29 ENCOUNTER — Ambulatory Visit: Admitting: Vascular Surgery

## 2024-11-29 ENCOUNTER — Ambulatory Visit (HOSPITAL_COMMUNITY)

## 2025-03-27 ENCOUNTER — Ambulatory Visit
